# Patient Record
Sex: Male | Born: 1975 | Race: White | Hispanic: No | Smoking: Never smoker
Health system: Southern US, Community
[De-identification: ages and names within clinical notes are randomized; demographics above are authoritative.]

## PROBLEM LIST (undated history)

## (undated) ENCOUNTER — Emergency Department (HOSPITAL_COMMUNITY)
Admission: RE | Disposition: A | Discharge status: Home / Self Care | Payer: Medicaid Other | Attending: Emergency Medicine | Admitting: Emergency Medicine

## (undated) DIAGNOSIS — F4310 Post-traumatic stress disorder, unspecified: Secondary | ICD-10-CM

## (undated) DIAGNOSIS — F32A Depression, unspecified: Secondary | ICD-10-CM

## (undated) DIAGNOSIS — B20 Human immunodeficiency virus [HIV] disease: Secondary | ICD-10-CM

## (undated) DIAGNOSIS — C801 Malignant (primary) neoplasm, unspecified: Secondary | ICD-10-CM

## (undated) DIAGNOSIS — F329 Major depressive disorder, single episode, unspecified: Secondary | ICD-10-CM

## (undated) DIAGNOSIS — C8590 Non-Hodgkin lymphoma, unspecified, unspecified site: Secondary | ICD-10-CM

## (undated) DIAGNOSIS — D649 Anemia, unspecified: Secondary | ICD-10-CM

## (undated) DIAGNOSIS — K7460 Unspecified cirrhosis of liver: Secondary | ICD-10-CM

## (undated) HISTORY — PX: OTHER SURGICAL HISTORY: SHX169

## (undated) HISTORY — PX: CHOLECYSTECTOMY: SHX55

## (undated) HISTORY — PX: APPENDECTOMY: SHX54

---

## 2013-10-16 ENCOUNTER — Emergency Department (HOSPITAL_COMMUNITY): Payer: Medicaid Other

## 2013-10-16 ENCOUNTER — Inpatient Hospital Stay (HOSPITAL_COMMUNITY)
Admission: EM | Admit: 2013-10-16 | Discharge: 2013-10-22 | DRG: 977 | Disposition: A | Discharge status: Other Acute Inpatient Hospital | Payer: Medicaid Other | Attending: Internal Medicine | Admitting: Internal Medicine

## 2013-10-16 ENCOUNTER — Encounter (HOSPITAL_COMMUNITY): Payer: Self-pay | Admitting: Emergency Medicine

## 2013-10-16 DIAGNOSIS — K529 Noninfective gastroenteritis and colitis, unspecified: Secondary | ICD-10-CM | POA: Diagnosis present

## 2013-10-16 DIAGNOSIS — Z9089 Acquired absence of other organs: Secondary | ICD-10-CM

## 2013-10-16 DIAGNOSIS — D696 Thrombocytopenia, unspecified: Secondary | ICD-10-CM

## 2013-10-16 DIAGNOSIS — I10 Essential (primary) hypertension: Secondary | ICD-10-CM | POA: Diagnosis present

## 2013-10-16 DIAGNOSIS — C8589 Other specified types of non-Hodgkin lymphoma, extranodal and solid organ sites: Secondary | ICD-10-CM | POA: Diagnosis present

## 2013-10-16 DIAGNOSIS — A09 Infectious gastroenteritis and colitis, unspecified: Principal | ICD-10-CM | POA: Diagnosis present

## 2013-10-16 DIAGNOSIS — Z9221 Personal history of antineoplastic chemotherapy: Secondary | ICD-10-CM

## 2013-10-16 DIAGNOSIS — R197 Diarrhea, unspecified: Secondary | ICD-10-CM

## 2013-10-16 DIAGNOSIS — R188 Other ascites: Secondary | ICD-10-CM | POA: Diagnosis present

## 2013-10-16 DIAGNOSIS — E876 Hypokalemia: Secondary | ICD-10-CM | POA: Diagnosis present

## 2013-10-16 DIAGNOSIS — R109 Unspecified abdominal pain: Secondary | ICD-10-CM

## 2013-10-16 DIAGNOSIS — K219 Gastro-esophageal reflux disease without esophagitis: Secondary | ICD-10-CM | POA: Diagnosis present

## 2013-10-16 DIAGNOSIS — R112 Nausea with vomiting, unspecified: Secondary | ICD-10-CM | POA: Diagnosis present

## 2013-10-16 DIAGNOSIS — D61818 Other pancytopenia: Secondary | ICD-10-CM | POA: Diagnosis present

## 2013-10-16 DIAGNOSIS — D72819 Decreased white blood cell count, unspecified: Secondary | ICD-10-CM

## 2013-10-16 DIAGNOSIS — R1110 Vomiting, unspecified: Secondary | ICD-10-CM

## 2013-10-16 DIAGNOSIS — C8580 Other specified types of non-Hodgkin lymphoma, unspecified site: Secondary | ICD-10-CM | POA: Diagnosis present

## 2013-10-16 DIAGNOSIS — B20 Human immunodeficiency virus [HIV] disease: Secondary | ICD-10-CM

## 2013-10-16 DIAGNOSIS — K7460 Unspecified cirrhosis of liver: Secondary | ICD-10-CM | POA: Diagnosis present

## 2013-10-16 DIAGNOSIS — K5289 Other specified noninfective gastroenteritis and colitis: Secondary | ICD-10-CM

## 2013-10-16 DIAGNOSIS — B370 Candidal stomatitis: Secondary | ICD-10-CM | POA: Diagnosis present

## 2013-10-16 HISTORY — DX: Non-Hodgkin lymphoma, unspecified, unspecified site: C85.90

## 2013-10-16 HISTORY — DX: Malignant (primary) neoplasm, unspecified: C80.1

## 2013-10-16 LAB — URINALYSIS, ROUTINE W REFLEX MICROSCOPIC
Bilirubin Urine: NEGATIVE
GLUCOSE, UA: NEGATIVE mg/dL
Leukocytes, UA: NEGATIVE
Nitrite: NEGATIVE
PH: 7 (ref 5.0–8.0)
Protein, ur: NEGATIVE mg/dL
Specific Gravity, Urine: 1.025 (ref 1.005–1.030)
UROBILINOGEN UA: 0.2 mg/dL (ref 0.0–1.0)

## 2013-10-16 LAB — COMPREHENSIVE METABOLIC PANEL
ALBUMIN: 2.6 g/dL — AB (ref 3.5–5.2)
ALT: 23 U/L (ref 0–53)
AST: 45 U/L — AB (ref 0–37)
Alkaline Phosphatase: 92 U/L (ref 39–117)
BUN: 7 mg/dL (ref 6–23)
CHLORIDE: 109 meq/L (ref 96–112)
CO2: 21 mEq/L (ref 19–32)
Calcium: 8.5 mg/dL (ref 8.4–10.5)
Creatinine, Ser: 0.71 mg/dL (ref 0.50–1.35)
GFR calc Af Amer: >90 mL/min (ref >90)
GFR calc non Af Amer: >90 mL/min (ref >90)
Glucose, Bld: 91 mg/dL (ref 70–99)
Potassium: 3.2 mEq/L — ABNORMAL LOW (ref 3.7–5.3)
Sodium: 141 mEq/L (ref 137–147)
TOTAL PROTEIN: 6.5 g/dL (ref 6.0–8.3)
Total Bilirubin: 3.3 mg/dL — ABNORMAL HIGH (ref 0.3–1.2)

## 2013-10-16 LAB — CBC WITH DIFFERENTIAL/PLATELET
Basophils Absolute: 0 10*3/uL (ref 0.0–0.1)
Basophils Relative: 1 % (ref 0–1)
EOS PCT: 3 % (ref 0–5)
Eosinophils Absolute: 0.1 10*3/uL (ref 0.0–0.7)
HEMATOCRIT: 35.7 % — AB (ref 39.0–52.0)
Hemoglobin: 13 g/dL (ref 13.0–17.0)
LYMPHS ABS: 0.8 10*3/uL (ref 0.7–4.0)
LYMPHS PCT: 24 % (ref 12–46)
MCH: 36.2 pg — AB (ref 26.0–34.0)
MCHC: 36.4 g/dL — ABNORMAL HIGH (ref 30.0–36.0)
MCV: 99.4 fL (ref 78.0–100.0)
MONO ABS: 0.3 10*3/uL (ref 0.1–1.0)
Monocytes Relative: 9 % (ref 3–12)
Neutro Abs: 2.2 10*3/uL (ref 1.7–7.7)
Neutrophils Relative %: 64 % (ref 43–77)
Platelets: 58 10*3/uL — ABNORMAL LOW (ref 150–400)
RBC: 3.59 MIL/uL — AB (ref 4.22–5.81)
RDW: 16.5 % — ABNORMAL HIGH (ref 11.5–15.5)
WBC: 3.5 10*3/uL — AB (ref 4.0–10.5)

## 2013-10-16 LAB — I-STAT CG4 LACTIC ACID, ED: Lactic Acid, Venous: 1.48 mmol/L (ref 0.5–2.2)

## 2013-10-16 LAB — URINE MICROSCOPIC-ADD ON

## 2013-10-16 LAB — LIPASE, BLOOD: Lipase: 133 U/L — ABNORMAL HIGH (ref 11–59)

## 2013-10-16 LAB — CLOSTRIDIUM DIFFICILE BY PCR: CDIFFPCR: NEGATIVE

## 2013-10-16 LAB — SEDIMENTATION RATE: Sed Rate: 15 mm/hr (ref 0–16)

## 2013-10-16 MED ORDER — RITONAVIR 100 MG PO CAPS
100.0000 mg | ORAL_CAPSULE | Freq: Every day | ORAL | Status: DC
  Administered 2013-10-17 – 2013-10-22 (×6): 100 mg via ORAL
  Filled 2013-10-16 (×7): qty 1

## 2013-10-16 MED ORDER — ACETAMINOPHEN 325 MG PO TABS: 650.0000 mg | ORAL_TABLET | Freq: Four times a day (QID) | ORAL | Status: DC | PRN

## 2013-10-16 MED ORDER — SODIUM CHLORIDE 0.9 % IJ SOLN
3.0000 mL | Freq: Two times a day (BID) | INTRAMUSCULAR | Status: DC
  Administered 2013-10-16 – 2013-10-22 (×4): 3 mL via INTRAVENOUS

## 2013-10-16 MED ORDER — MORPHINE SULFATE 4 MG/ML IJ SOLN
4.0000 mg | Freq: Once | INTRAMUSCULAR | Status: AC
  Administered 2013-10-16: 4 mg via INTRAVENOUS
  Filled 2013-10-16: qty 1

## 2013-10-16 MED ORDER — SODIUM CHLORIDE 0.9 % IV BOLUS (SEPSIS)
1000.0000 mL | Freq: Once | INTRAVENOUS | Status: AC
  Administered 2013-10-16: 1000 mL via INTRAVENOUS

## 2013-10-16 MED ORDER — DARUNAVIR ETHANOLATE 800 MG PO TABS
800.0000 mg | ORAL_TABLET | Freq: Every day | ORAL | Status: DC
  Administered 2013-10-17 – 2013-10-22 (×6): 800 mg via ORAL
  Filled 2013-10-16 (×7): qty 1

## 2013-10-16 MED ORDER — EMTRICITABINE-TENOFOVIR DF 200-300 MG PO TABS
1.0000 | ORAL_TABLET | Freq: Every day | ORAL | Status: DC
  Administered 2013-10-16 – 2013-10-21 (×6): 1 via ORAL
  Filled 2013-10-16 (×7): qty 1

## 2013-10-16 MED ORDER — HYDROMORPHONE HCL PF 1 MG/ML IJ SOLN
1.0000 mg | Freq: Once | INTRAMUSCULAR | Status: AC
  Administered 2013-10-16: 1 mg via INTRAVENOUS
  Filled 2013-10-16: qty 1

## 2013-10-16 MED ORDER — PNEUMOCOCCAL VAC POLYVALENT 25 MCG/0.5ML IJ INJ
0.5000 mL | INJECTION | INTRAMUSCULAR | Status: AC
  Administered 2013-10-17: 0.5 mL via INTRAMUSCULAR
  Filled 2013-10-16: qty 0.5

## 2013-10-16 MED ORDER — ONDANSETRON HCL 4 MG/2ML IJ SOLN
4.0000 mg | Freq: Once | INTRAMUSCULAR | Status: AC
  Administered 2013-10-16: 4 mg via INTRAVENOUS
  Filled 2013-10-16: qty 2

## 2013-10-16 MED ORDER — ESCITALOPRAM OXALATE 10 MG PO TABS
20.0000 mg | ORAL_TABLET | Freq: Every day | ORAL | Status: DC
  Administered 2013-10-17 – 2013-10-22 (×6): 20 mg via ORAL
  Filled 2013-10-16 (×6): qty 2

## 2013-10-16 MED ORDER — MORPHINE SULFATE 2 MG/ML IJ SOLN
2.0000 mg | INTRAMUSCULAR | Status: DC | PRN
  Administered 2013-10-16: 2 mg via INTRAVENOUS
  Filled 2013-10-16: qty 1

## 2013-10-16 MED ORDER — SODIUM CHLORIDE 0.9 % IV SOLN
INTRAVENOUS | Status: DC
  Administered 2013-10-17 – 2013-10-22 (×10): via INTRAVENOUS

## 2013-10-16 MED ORDER — PRAZOSIN HCL 2 MG PO CAPS
3.0000 mg | ORAL_CAPSULE | Freq: Every day | ORAL | Status: DC
  Administered 2013-10-16: 3 mg via ORAL
  Filled 2013-10-16 (×2): qty 1

## 2013-10-16 MED ORDER — GABAPENTIN 300 MG PO CAPS
300.0000 mg | ORAL_CAPSULE | Freq: Three times a day (TID) | ORAL | Status: DC
  Administered 2013-10-16 – 2013-10-22 (×18): 300 mg via ORAL
  Filled 2013-10-16 (×18): qty 1

## 2013-10-16 MED ORDER — POLYETHYLENE GLYCOL 3350 17 G PO PACK
17.0000 g | PACK | Freq: Every day | ORAL | Status: DC
Start: 2013-10-16 — End: 2013-10-19
  Administered 2013-10-17 – 2013-10-18 (×2): 17 g via ORAL
  Filled 2013-10-16 (×2): qty 1

## 2013-10-16 MED ORDER — ONDANSETRON HCL 4 MG/2ML IJ SOLN
4.0000 mg | Freq: Four times a day (QID) | INTRAMUSCULAR | Status: DC | PRN
  Administered 2013-10-18 – 2013-10-21 (×4): 4 mg via INTRAVENOUS
  Filled 2013-10-16 (×4): qty 2

## 2013-10-16 MED ORDER — IOHEXOL 300 MG/ML  SOLN
100.0000 mL | Freq: Once | INTRAMUSCULAR | Status: AC | PRN
  Administered 2013-10-16: 100 mL via INTRAVENOUS

## 2013-10-16 MED ORDER — ATOVAQUONE 750 MG/5ML PO SUSP
1500.0000 mg | Freq: Every day | ORAL | Status: DC
  Administered 2013-10-17 – 2013-10-22 (×6): 1500 mg via ORAL
  Filled 2013-10-16 (×7): qty 10

## 2013-10-16 MED ORDER — ONDANSETRON HCL 4 MG PO TABS: 4.0000 mg | ORAL_TABLET | Freq: Four times a day (QID) | ORAL | Status: DC | PRN

## 2013-10-16 MED ORDER — CIPROFLOXACIN IN D5W 400 MG/200ML IV SOLN
400.0000 mg | Freq: Two times a day (BID) | INTRAVENOUS | Status: DC
  Administered 2013-10-16 – 2013-10-22 (×12): 400 mg via INTRAVENOUS
  Filled 2013-10-16 (×12): qty 200

## 2013-10-16 MED ORDER — ACETAMINOPHEN 650 MG RE SUPP: 650.0000 mg | Freq: Four times a day (QID) | RECTAL | Status: DC | PRN

## 2013-10-16 MED ORDER — ALUM & MAG HYDROXIDE-SIMETH 200-200-20 MG/5ML PO SUSP: 30.0000 mL | Freq: Four times a day (QID) | ORAL | Status: DC | PRN

## 2013-10-16 MED ORDER — METRONIDAZOLE IN NACL 5-0.79 MG/ML-% IV SOLN
500.0000 mg | Freq: Three times a day (TID) | INTRAVENOUS | Status: DC
  Administered 2013-10-16 – 2013-10-22 (×18): 500 mg via INTRAVENOUS
  Filled 2013-10-16 (×18): qty 100

## 2013-10-16 MED ORDER — METRONIDAZOLE IN NACL 5-0.79 MG/ML-% IV SOLN: 500.0000 mg | Freq: Three times a day (TID) | INTRAVENOUS | Status: DC

## 2013-10-16 MED ORDER — IOHEXOL 300 MG/ML  SOLN
50.0000 mL | Freq: Once | INTRAMUSCULAR | Status: AC | PRN
  Administered 2013-10-16: 50 mL via ORAL

## 2013-10-16 MED ORDER — AZITHROMYCIN 600 MG PO TABS
1200.0000 mg | ORAL_TABLET | ORAL | Status: DC
  Administered 2013-10-17: 1200 mg via ORAL
  Filled 2013-10-16: qty 2

## 2013-10-16 MED ORDER — SODIUM CHLORIDE 0.9 % IV SOLN
INTRAVENOUS | Status: DC
  Administered 2013-10-16: 1000 mL via INTRAVENOUS

## 2013-10-16 MED ORDER — PANTOPRAZOLE SODIUM 40 MG PO TBEC
40.0000 mg | DELAYED_RELEASE_TABLET | Freq: Two times a day (BID) | ORAL | Status: DC
  Administered 2013-10-16 – 2013-10-22 (×12): 40 mg via ORAL
  Filled 2013-10-16 (×12): qty 1

## 2013-10-16 MED ORDER — SUCRALFATE 1 G PO TABS
1.0000 g | ORAL_TABLET | Freq: Three times a day (TID) | ORAL | Status: DC
  Administered 2013-10-16 – 2013-10-22 (×23): 1 g via ORAL
  Filled 2013-10-16 (×23): qty 1

## 2013-10-16 MED ORDER — MORPHINE SULFATE 2 MG/ML IJ SOLN: 2.0000 mg | INTRAMUSCULAR | Status: DC | PRN

## 2013-10-16 MED ORDER — POTASSIUM CHLORIDE CRYS ER 20 MEQ PO TBCR
40.0000 meq | EXTENDED_RELEASE_TABLET | Freq: Once | ORAL | Status: AC
  Administered 2013-10-16: 40 meq via ORAL
  Filled 2013-10-16: qty 2

## 2013-10-16 MED ORDER — PRAZOSIN HCL 1 MG PO CAPS
ORAL_CAPSULE | ORAL | Status: AC
Start: 2013-10-16 — End: 2013-10-16
  Filled 2013-10-16: qty 3

## 2013-10-16 NOTE — ED Notes (Signed)
Home # [phone] or [phone]

## 2013-10-16 NOTE — Care Management Note (Signed)
Patient was noted to not have a PCP listed, but per patient PCP is Dr Miquel Dunn at [facility]   . Entered this information into computer.

## 2013-10-16 NOTE — ED Notes (Signed)
Attempted to call report. Was advised nurse receiving pt will call this nurse back for report. 

## 2013-10-16 NOTE — ED Notes (Signed)
abd pain , NVD since Saturday.
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
## 2013-10-16 NOTE — ED Provider Notes (Signed)
CSN: [id number]     Arrival date & time 10/16/13  1103 History  This chart was scribed for Sharyon Cable, MD by Jenne Campus, ED Scribe. This patient was seen in room APA08/APA08 and the patient's care was started at 11:33 AM.   Chief Complaint  Patient presents with  . Abdominal Pain     Patient is a 38 y.o. male presenting with diarrhea. The history is provided by the patient. No language interpreter was used.  Diarrhea Quality:  Watery Severity:  Moderate Number of episodes:  Every 30 minutes  Duration:  2 days Timing:  Constant Associated symptoms: abdominal pain and vomiting   Associated symptoms: no fever     HPI Comments: Samuel Schultz is a 38 y.o. male with a h/o HIV who presents to the Emergency Department from a rest home complaining of diarrhea with associated nausea, emesis and mid abdominal pain for the past 2 days.  Pt states that he has been experiencing diarrhea episodes described as water every 30 minutes and has had 6 episodes of emesis since onset. He denies having any complaints prior to the symptoms onset. Representative from the rest home denies having other sick contacts there. Pt reports prior episodes of similar symptoms with prior C. Diff infections. He reports a recent hopsitalization at [facility] 2 weeks ago for a cough but denies having any nausea, emesis or diarrhea at that time. CD4 counts have been lower recently but reports medication compliance. He denies any known fevers, hematochezia and hematemesis as associated symptoms.    Past Medical History  Diagnosis Date  . Cancer   . Lymphoma    Past Surgical History  Procedure Laterality Date  . Cholecystectomy    . Appendectomy     History reviewed. No pertinent family history. History  Substance Use Topics  . Smoking status: Never Smoker   . Smokeless tobacco: Not on file  . Alcohol Use: No    Review of Systems  Constitutional: Negative for fever.  Gastrointestinal: Positive for vomiting,  abdominal pain and diarrhea. Negative for blood in stool.  Genitourinary: Negative for testicular pain.  All other systems reviewed and are negative.   Allergies  Codeine  Home Medications   Prior to Admission medications   Not on File   Triage vitals: BP 134/74  Pulse 72  Temp(Src) 98.7 F (37.1 C) (Oral)  Resp 20  Ht 5\' 9"  (1.753 m)  Wt 210 lb (95.255 kg)  BMI 31.00 kg/m2  SpO2 100%  Physical Exam  Nursing note and vitals reviewed.  CONSTITUTIONAL: Well developed/well nourished HEAD: Normocephalic/atraumatic EYES: EOMI/PERRL ENMT: Mucous membranes moist NECK: supple no meningeal signs SPINE:entire spine nontender CV: S1/S2 noted, no murmurs/rubs/gallops noted LUNGS: crackles noted in base no apparent distress ABDOMEN: soft, diffuse moderate abdominal tenderness, no rebound or guarding GU:no cva tenderness NEURO: Pt is awake/alert, moves all extremitiesx4 EXTREMITIES: pulses normal, full ROM SKIN: warm, color normal PSYCH: no abnormalities of mood noted  ED Course  Procedures   Medications  morphine 4 MG/ML injection 4 mg (not administered)  ondansetron (ZOFRAN) injection 4 mg (not administered)  sodium chloride 0.9 % bolus 1,000 mL (not administered)  morphine 4 MG/ML injection 4 mg (4 mg Intravenous Given 10/16/13 1208)  ondansetron (ZOFRAN) injection 4 mg (4 mg Intravenous Given 10/16/13 1208)  sodium chloride 0.9 % bolus 1,000 mL (0 mLs Intravenous Stopped 10/16/13 1332)  sodium chloride 0.9 % bolus 1,000 mL (0 mLs Intravenous Stopped 10/16/13 1529)  ondansetron (ZOFRAN) injection 4  mg (4 mg Intravenous Given 10/16/13 1332)  morphine 4 MG/ML injection 4 mg (4 mg Intravenous Given 10/16/13 1332)  iohexol (OMNIPAQUE) 300 MG/ML solution 50 mL (50 mLs Oral Contrast Given 10/16/13 1342)    DIAGNOSTIC STUDIES: Oxygen Saturation is 100% on RA, normal by my interpretation.    COORDINATION OF CARE: 11:37 AM-Discussed treatment plan which includes medications, CXR,  CBC panel, CMP and UA with pt at bedside and pt agreed to plan.   1:15 PM Pt with conitnued abd pain will order CT imaging of abd/pelvis 3:31 PM Pt will require CT imaging.  Pt with h/o HIV and is high risk for intra-abdominal infection At signout to dr cook, f/u on CT imaging. Stool cultures are pending at this time BP 128/76  Pulse 68  Temp(Src) 97.8 F (36.6 C) (Oral)  Resp 18  Ht 5\' 9"  (1.753 m)  Wt 210 lb (95.255 kg)  BMI 31.00 kg/m2  SpO2 99% Pt reports recent admission to [facility] but unable to find records on [facility] - Abnormal; Notable for the following:    Potassium 3.2 (*)    Albumin 2.6 (*)    AST 45 (*)    Total Bilirubin 3.3 (*)    All other components within normal limits  CBC WITH DIFFERENTIAL - Abnormal; Notable for the following:    WBC 3.5 (*)    RBC 3.59 (*)    HCT 35.7 (*)    MCH 36.2 (*)    MCHC 36.4 (*)    RDW 16.5 (*)    Platelets 58 (*)    All other components within normal limits  LIPASE, BLOOD - Abnormal; Notable for the following:    Lipase 133 (*)    All other components within normal limits  CLOSTRIDIUM DIFFICILE BY PCR  STOOL CULTURE  URINALYSIS, ROUTINE W REFLEX MICROSCOPIC  I-STAT CG4 LACTIC ACID, ED    Imaging Review Dg Chest Portable 1 View  10/16/2013   CLINICAL DATA:  Cough; lymphoma  EXAM: PORTABLE CHEST - 1 VIEW  COMPARISON:  None.  FINDINGS: Lungs are clear. Heart is upper normal in size with normal pulmonary vascularity. Central catheter tip is in the right atrium. No pneumothorax. No adenopathy. No bone lesions.  IMPRESSION: No edema or consolidation.  No pneumothorax.   Electronically Signed   By: Lowella Grip M.D.   On: 10/16/2013 12:08      MDM   Final diagnoses:  Abdominal pain  Vomiting and diarrhea  Leukopenia  Thrombocytopenia    Nursing notes including past medical history and social history reviewed and considered in documentation xrays  reviewed and considered Labs/vital reviewed and considered   I personally performed the services described in this documentation, which was scribed in my presence. The recorded information has been reviewed and is accurate.     Sharyon Cable, MD 10/16/13 [phone]

## 2013-10-16 NOTE — ED Notes (Signed)
Attempted to call report

## 2013-10-17 DIAGNOSIS — K7460 Unspecified cirrhosis of liver: Secondary | ICD-10-CM

## 2013-10-17 LAB — CBC
HCT: 31.1 % — ABNORMAL LOW (ref 39.0–52.0)
Hemoglobin: 11 g/dL — ABNORMAL LOW (ref 13.0–17.0)
MCH: 35.9 pg — AB (ref 26.0–34.0)
MCHC: 35.4 g/dL (ref 30.0–36.0)
MCV: 101.6 fL — ABNORMAL HIGH (ref 78.0–100.0)
PLATELETS: 42 10*3/uL — AB (ref 150–400)
RBC: 3.06 MIL/uL — ABNORMAL LOW (ref 4.22–5.81)
RDW: 16.9 % — AB (ref 11.5–15.5)
WBC: 1.3 10*3/uL — AB (ref 4.0–10.5)

## 2013-10-17 LAB — BASIC METABOLIC PANEL
BUN: 7 mg/dL (ref 6–23)
CALCIUM: 7.9 mg/dL — AB (ref 8.4–10.5)
CO2: 22 mEq/L (ref 19–32)
CREATININE: 0.82 mg/dL (ref 0.50–1.35)
Chloride: 111 mEq/L (ref 96–112)
GFR calc Af Amer: >90 mL/min (ref >90)
Glucose, Bld: 84 mg/dL (ref 70–99)
Potassium: 4.1 mEq/L (ref 3.7–5.3)
SODIUM: 139 meq/L (ref 137–147)

## 2013-10-17 LAB — CRYPTOSPORIDIUM SMEAR, FECAL: CRYPTOSPORIDIUM SMEAR.: NONE SEEN

## 2013-10-17 LAB — OVA AND PARASITE EXAMINATION: OVA AND PARASITES: NONE SEEN

## 2013-10-17 MED ORDER — HYDROMORPHONE HCL PF 1 MG/ML IJ SOLN
1.0000 mg | Freq: Once | INTRAMUSCULAR | Status: AC
  Administered 2013-10-17: 1 mg via INTRAVENOUS
  Filled 2013-10-17: qty 1

## 2013-10-17 MED ORDER — DICYCLOMINE HCL 10 MG/ML IM SOLN
20.0000 mg | Freq: Once | INTRAMUSCULAR | Status: AC
  Administered 2013-10-17: 20 mg via INTRAMUSCULAR
  Filled 2013-10-17 (×2): qty 2

## 2013-10-17 MED ORDER — HYDROMORPHONE HCL PF 1 MG/ML IJ SOLN
1.0000 mg | INTRAMUSCULAR | Status: DC | PRN
  Administered 2013-10-17 – 2013-10-18 (×8): 1 mg via INTRAVENOUS
  Filled 2013-10-17 (×8): qty 1

## 2013-10-17 MED ORDER — PRAZOSIN HCL 1 MG PO CAPS
3.0000 mg | ORAL_CAPSULE | Freq: Every day | ORAL | Status: DC
  Administered 2013-10-17: 3 mg via ORAL
  Filled 2013-10-17 (×3): qty 3

## 2013-10-17 MED ORDER — BOOST / RESOURCE BREEZE PO LIQD
1.0000 | Freq: Three times a day (TID) | ORAL | Status: DC
  Administered 2013-10-17 – 2013-10-22 (×16): 1 via ORAL

## 2013-10-17 NOTE — Progress Notes (Signed)
INITIAL NUTRITION ASSESSMENT  DOCUMENTATION CODES Per approved criteria  -Obesity Unspecified   INTERVENTION: Resource Breeze po TID, each supplement provides 250 kcal and 9 grams of protein  NUTRITION DIAGNOSIS: Inadequate oral intake related to decreased appetite as evidenced by 3.6% wt loss x 1 week, poor po's.   Goal: Pt will meet >90% of estimated nutritional needs  Monitor:  Diet advancement, PO intake, labs, skin assessments, weight changes, I/O's  Reason for Assessment: MST=2  38 y.o. male  Admitting Dx: Colitis, acute  ASSESSMENT: Pt admitted from a local group home with abdominal pain secondary to colitis. Pt recently relocated to [place] from the [facility] area due to group home placement.  Pt reports variable appetite PTA, reporting his appetite "comes and goes". He reveals he generally has nausea in the AM, which affects his appetite. He reveals that he has lost 8# (3.6%) wt loss x 1 week, which is clinically significant. Reports UBW around 220#. Pt reports tolerating liquid diet well, however, noted he went to the bathroom shortly after drinking it. He reports he has gone to the bathroom 5 times today. He does not feel like he would be able to tolerate anything more substantial than clear liquids at this time. He reports he ate only chicken broth for lunch.  He does not drink nutritional supplements PTA, but asked about a clear liquid supplement. He is agreeable to try [employer].  Nutrition Focused Physical Exam:  Subcutaneous Fat:  Orbital Region: WDL Upper Arm Region: WDL Thoracic and Lumbar Region: WDL  Muscle:  Temple Region: WDL Clavicle Bone Region: WDL Clavicle and Acromion Bone Region: WDL Scapular Bone Region: WDL Dorsal Hand: WDL Patellar Region: WDL Anterior Thigh Region: WDL Posterior Calf Region: WDL  Edema: none present   Height: Ht Readings from Last 1 Encounters:  10/16/13 5\' 9"  (1.753 m)    Weight: Wt Readings from Last 1  Encounters:  10/16/13 212 lb 1.6 oz (96.208 kg)    Ideal Body Weight: 160#  % Ideal Body Weight: 133%  Wt Readings from Last 10 Encounters:  10/16/13 212 lb 1.6 oz (96.208 kg)    Usual Body Weight: 220#  % Usual Body Weight: 96%  BMI:  Body mass index is 31.31 kg/(m^2). Meets criteria for obesity, class I.   Estimated Nutritional Needs: Kcal: 1800-2000 daily Protein: 96-134 grams daily Fluid: 1.8-2.0 L daily  Skin: WDL  Diet Order: Clear Liquid  EDUCATION NEEDS: -Education needs addressed   Intake/Output Summary (Last 24 hours) at 10/17/13 1500 Last data filed at 10/17/13 7829  Gross per 24 hour  Intake 1643.75 ml  Output    300 ml  Net 1343.75 ml    Last BM: 10/16/13  Labs:   Recent Labs Lab 10/16/13 1149 10/17/13 0514  NA 141 139  K 3.2* 4.1  CL 109 111  CO2 21 22  BUN 7 7  CREATININE 0.71 0.82  CALCIUM 8.5 7.9*  GLUCOSE 91 84    CBG (last 3)  No results found for this basename: GLUCAP,  in the last 72 hours  Scheduled Meds: . atovaquone  1,500 mg Oral Daily  . azithromycin  1,200 mg Oral Q7 days  . ciprofloxacin  400 mg Intravenous Q12H  . Darunavir Ethanolate  800 mg Oral Q breakfast  . emtricitabine-tenofovir  1 tablet Oral QHS  . escitalopram  20 mg Oral Daily  . gabapentin  300 mg Oral TID  . metronidazole  500 mg Intravenous Q8H  . pantoprazole  40  mg Oral BID  . polyethylene glycol  17 g Oral Daily  . prazosin  3 mg Oral QHS  . ritonavir  100 mg Oral Q breakfast  . sodium chloride  3 mL Intravenous Q12H  . sucralfate  1 g Oral TID WC & HS    Continuous Infusions: . sodium chloride 125 mL/hr at 10/17/13 1104    Past Medical History  Diagnosis Date  . Cancer   . Lymphoma     Past Surgical History  Procedure Laterality Date  . Cholecystectomy    . Appendectomy      Willy Pinkerton A. Jimmye Norman, RD, LDN Pager: [phone]

## 2013-10-17 NOTE — Progress Notes (Signed)
[facility] PROGRESS NOTE  Samuel Schultz GUR:[id number] DOB: [birth year] DOA: 10/16/2013 PCP: No primary provider on file.  Assessment/Plan: 1. Acute colitis, likely infectious. Patient does have a history of AIDS. Stool for C. difficile, cryptosporidium, ova and parasites were found to be negative. We'll send for GI pathogen panel. Continue current antibiotics. Advance diet. If patient fails to improve, consider GI consultation. 2. AIDS. Continue antiretroviral therapy. He is on May problem and azithromycin for prophylaxis. Last CD4 count was less than 100 3. History lymphoma. Was previously treated with chemotherapy. Reports that he is in remission. 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 4. Cirrhosis. Related to lymphoma. 5. Pancytopenia, likely related to cirrhosis. No evidence of bleeding. Continue to follow  Code Status: full code Family Communication: no family present Disposition Plan: discharge home once improved   Consultants:    Procedures:    Antibiotics:  Ciprofloxacin 5/11>>  Flagyl 5/11>>  HPI/Subjective: Continues to have watery stool, no vomiting, wants to advance diet  Objective: Filed Vitals:   10/17/13 2000  BP: 96/43  Pulse: 74  Temp: 97.7 F (36.5 C)  Resp: 20    Intake/Output Summary (Last 24 hours) at 10/17/13 2030 Last data filed at 10/17/13 2001  Gross per 24 hour  Intake 643.75 ml  Output    950 ml  Net -306.25 ml   Filed Weights   10/16/13 1111 10/16/13 2051  Weight: 95.255 kg (210 lb) 96.208 kg (212 lb 1.6 oz)    Exam:   General:  No acute distress  Cardiovascular: S1, S2, regular rate and rhythm  Respiratory: Clear to auscultation bilaterally  Abdomen: Soft, tender in the lower abdomen, positive bowel sounds  Musculoskeletal: No pedal edema bilaterally   Data Reviewed: Basic Metabolic Panel:  Recent Labs Lab 10/16/13 1149 10/17/13 0514  NA 141 139  K 3.2* 4.1  CL 109 111  CO2 21 22   GLUCOSE 91 84  BUN 7 7  CREATININE 0.71 0.82  CALCIUM 8.5 7.9*   Liver Function Tests:  Recent Labs Lab 10/16/13 1149  AST 45*  ALT 23  ALKPHOS 92  BILITOT 3.3*  PROT 6.5  ALBUMIN 2.6*    Recent Labs Lab 10/16/13 1149  LIPASE 133*   No results found for this basename: AMMONIA,  in the last 168 hours CBC:  Recent Labs Lab 10/16/13 1149 10/17/13 0514  WBC 3.5* 1.3*  NEUTROABS 2.2  --   HGB 13.0 11.0*  HCT 35.7* 31.1*  MCV 99.4 101.6*  PLT 58* 42*   Cardiac Enzymes: No results found for this basename: CKTOTAL, CKMB, CKMBINDEX, TROPONINI,  in the last 168 hours BNP (last 3 results) No results found for this basename: PROBNP,  in the last 8760 hours CBG: No results found for this basename: GLUCAP,  in the last 168 hours  Recent Results (from the past 240 hour(s))  CLOSTRIDIUM DIFFICILE BY PCR     Status: None   Collection Time    10/16/13  3:48 PM      Result Value Ref Range Status   C difficile by pcr NEGATIVE  NEGATIVE Final  OVA AND PARASITE EXAMINATION     Status: None   Collection Time    10/16/13  3:48 PM      Result Value Ref Range Status   Specimen Description STOOL   Final   Special Requests IMMUNE:COMPROMISED   Final   Ova and parasites     Final   Value: NO OVA OR PARASITES SEEN  Performed at [facility]   Report Status 10/17/2013 FINAL   Final  CRYPTOSPORIDIUM SMEAR, FECAL     Status: None   Collection Time    10/16/13  3:48 PM      Result Value Ref Range Status   Specimen Description STOOL   Final   Special Requests NONE   Final   Cryptosporidium Smear.     Final   Value: NO Cryptosporidium Cyclospora or Isospora seen.     Performed at [facility]   Report Status 10/17/2013 FINAL   Final  CULTURE, BLOOD (ROUTINE X 2)     Status: None   Collection Time    10/16/13  6:06 PM      Result Value Ref Range Status   Specimen Description BLOOD RIGHT ANTECUBITAL   Final   Special Requests     Final   Value: BOTTLES DRAWN  AEROBIC AND ANAEROBIC 8CC IMMUNE:COMPROMISED   Culture NO GROWTH 1 DAY   Final   Report Status PENDING   Incomplete  CULTURE, BLOOD (ROUTINE X 2)     Status: None   Collection Time    10/16/13  6:11 PM      Result Value Ref Range Status   Specimen Description BLOOD LEFT ANTECUBITAL   Final   Special Requests     Final   Value: BOTTLES DRAWN AEROBIC AND ANAEROBIC 8CC IMMUNE:COMPROMISED   Culture NO GROWTH 1 DAY   Final   Report Status PENDING   Incomplete     Studies: Ct Abdomen Pelvis W Contrast  10/16/2013   CLINICAL DATA:  Abdomen pain in nausea vomiting since Saturday. History of lymphoma.  EXAM: CT ABDOMEN AND PELVIS WITH CONTRAST  TECHNIQUE: Multidetector CT imaging of the abdomen and pelvis was performed using the standard protocol following bolus administration of intravenous contrast.  CONTRAST:  119mL OMNIPAQUE IOHEXOL 300 MG/ML  SOLN  COMPARISON:  None.  FINDINGS: There is diffuse fatty infiltration the liver. There is mild nodular contour with enlargement of left lobe liver. There are enlarged varices at the splenic hilum. The spleen is enlarged measuring 17.3 cm in length. There is ascites in the abdomen and pelvis. The patient is status post prior cholecystectomy. There are surgical clips surrounding the lateral periphery of the liver. The pancreas is normal. The adrenal glands and kidneys are normal. There is no hydronephrosis bilaterally. There is no abdominal lymphadenopathy. There is umbilical herniation of mesenteric fat.  The small bowel loops are mildly prominent without evidence of frank obstruction. There is diffuse bowel wall thickening of throughout the colon. The patient is status post prior appendectomy.  Free fluid is identified within the pelvis. The bladder is decompressed limiting evaluation. There is no pelvic lymphadenopathy.  Images of the lung bases demonstrate mild atelectasis of the posterior lung bases. There is no pulmonary mass or pleural effusion. Images of the  bones demonstrate patchy increased sclerosis at T7, T10, T11, L3, L5, S1 vertebral bodies as well as mixed sclerotic and lytic lucencies in bilateral ilium.  IMPRESSION: Diffuse bowel wall thickening throughout the colon. The findings can be seen in colitis either infectious or inflammatory. Vascular ischemia is much less likely.  Changes of cirrhosis of liver for as described.  Splenomegaly.  Findings suspicious for bone metastasis.   Electronically Signed   By: Abelardo Diesel M.D.   On: 10/16/2013 16:07   Dg Chest Portable 1 View  10/16/2013   CLINICAL DATA:  Cough; lymphoma  EXAM: PORTABLE CHEST -  1 VIEW  COMPARISON:  None.  FINDINGS: Lungs are clear. Heart is upper normal in size with normal pulmonary vascularity. Central catheter tip is in the right atrium. No pneumothorax. No adenopathy. No bone lesions.  IMPRESSION: No edema or consolidation.  No pneumothorax.   Electronically Signed   By: Lowella Grip M.D.   On: 10/16/2013 12:08    Scheduled Meds: . atovaquone  1,500 mg Oral Daily  . azithromycin  1,200 mg Oral Q7 days  . ciprofloxacin  400 mg Intravenous Q12H  . Darunavir Ethanolate  800 mg Oral Q breakfast  . emtricitabine-tenofovir  1 tablet Oral QHS  . escitalopram  20 mg Oral Daily  . feeding supplement (RESOURCE BREEZE)  1 Container Oral TID BM  . gabapentin  300 mg Oral TID  . metronidazole  500 mg Intravenous Q8H  . pantoprazole  40 mg Oral BID  . polyethylene glycol  17 g Oral Daily  . prazosin  3 mg Oral QHS  . ritonavir  100 mg Oral Q breakfast  . sodium chloride  3 mL Intravenous Q12H  . sucralfate  1 g Oral TID WC & HS   Continuous Infusions: . sodium chloride 125 mL/hr at 10/17/13 1104    Principal Problem:   Colitis, acute Active Problems:   Abdominal pain   AIDS   Large cell lymphoma   Cirrhosis, non-alcoholic   Pancytopenia   Diarrhea   Nausea and vomiting in adult   Hypokalemia   Acute colitis    Time spent: 67mins    Dang Mathison  [facility] Pager [phone]. If 7PM-7AM, please contact night-coverage at [email or web address], password [facility] 10/17/2013, 8:30 PM  LOS: 1 day

## 2013-10-17 NOTE — Progress Notes (Signed)
Pt's stool sample came back negative for c.diff. Will take off enteric precautions, will continue to monitor.

## 2013-10-17 NOTE — Progress Notes (Signed)
CRITICAL VALUE ALERT  Critical value received:  WBC 1.3   Date of notification:  10/17/13  Time of notification:  0630  Critical value read back:yes  Nurse who received alert:  Penni Homans, RN   MD notified (1st page):  Dr. Darrick Meigs  Time of first page:  0630  MD notified (2nd page):  Time of second page:  Responding MD:  Dr. Darrick Meigs  Time MD responded:

## 2013-10-17 NOTE — Care Management Utilization Note (Signed)
UR completed 

## 2013-10-18 ENCOUNTER — Other Ambulatory Visit (HOSPITAL_COMMUNITY): Payer: Self-pay | Admitting: Hematology and Oncology

## 2013-10-18 DIAGNOSIS — Z87898 Personal history of other specified conditions: Secondary | ICD-10-CM

## 2013-10-18 DIAGNOSIS — K598 Other specified functional intestinal disorders: Secondary | ICD-10-CM

## 2013-10-18 DIAGNOSIS — K5989 Other specified functional intestinal disorders: Secondary | ICD-10-CM

## 2013-10-18 DIAGNOSIS — B150 Hepatitis A with hepatic coma: Secondary | ICD-10-CM

## 2013-10-18 DIAGNOSIS — D709 Neutropenia, unspecified: Secondary | ICD-10-CM

## 2013-10-18 LAB — CBC WITH DIFFERENTIAL/PLATELET
BASOS ABS: 0 10*3/uL (ref 0.0–0.1)
BASOS PCT: 1 % (ref 0–1)
Eosinophils Absolute: 0.1 10*3/uL (ref 0.0–0.7)
Eosinophils Relative: 7 % — ABNORMAL HIGH (ref 0–5)
HCT: 28.8 % — ABNORMAL LOW (ref 39.0–52.0)
Hemoglobin: 10.3 g/dL — ABNORMAL LOW (ref 13.0–17.0)
LYMPHS PCT: 46 % (ref 12–46)
Lymphs Abs: 0.4 10*3/uL — ABNORMAL LOW (ref 0.7–4.0)
MCH: 36.4 pg — ABNORMAL HIGH (ref 26.0–34.0)
MCHC: 35.8 g/dL (ref 30.0–36.0)
MCV: 101.8 fL — ABNORMAL HIGH (ref 78.0–100.0)
MONO ABS: 0.1 10*3/uL (ref 0.1–1.0)
Monocytes Relative: 12 % (ref 3–12)
NEUTROS ABS: 0.3 10*3/uL — AB (ref 1.7–7.7)
NEUTROS PCT: 33 % — AB (ref 43–77)
Platelets: 33 10*3/uL — ABNORMAL LOW (ref 150–400)
RBC: 2.83 MIL/uL — ABNORMAL LOW (ref 4.22–5.81)
RDW: 16.4 % — AB (ref 11.5–15.5)
WBC: 0.8 10*3/uL — AB (ref 4.0–10.5)

## 2013-10-18 LAB — LACTATE DEHYDROGENASE: LDH: 218 U/L (ref 94–250)

## 2013-10-18 LAB — BASIC METABOLIC PANEL
BUN: 5 mg/dL — ABNORMAL LOW (ref 6–23)
CO2: 20 meq/L (ref 19–32)
CREATININE: 0.71 mg/dL (ref 0.50–1.35)
Calcium: 7.5 mg/dL — ABNORMAL LOW (ref 8.4–10.5)
Chloride: 112 mEq/L (ref 96–112)
GFR calc non Af Amer: >90 mL/min (ref >90)
Glucose, Bld: 148 mg/dL — ABNORMAL HIGH (ref 70–99)
POTASSIUM: 3.3 meq/L — AB (ref 3.7–5.3)
SODIUM: 139 meq/L (ref 137–147)

## 2013-10-18 LAB — PATHOLOGIST SMEAR REVIEW

## 2013-10-18 LAB — RETICULOCYTES
RBC.: 2.06 MIL/uL — ABNORMAL LOW (ref 4.22–5.81)
Retic Count, Absolute: 45.3 10*3/uL (ref 19.0–186.0)
Retic Ct Pct: 2.2 % (ref 0.4–3.1)

## 2013-10-18 MED ORDER — FLUCONAZOLE IN SODIUM CHLORIDE 400-0.9 MG/200ML-% IV SOLN
400.0000 mg | INTRAVENOUS | Status: DC
  Administered 2013-10-18 – 2013-10-22 (×5): 400 mg via INTRAVENOUS
  Filled 2013-10-18 (×5): qty 200

## 2013-10-18 MED ORDER — HYDROMORPHONE HCL PF 1 MG/ML IJ SOLN
1.0000 mg | INTRAMUSCULAR | Status: DC | PRN
  Administered 2013-10-19: 1 mg via INTRAVENOUS
  Filled 2013-10-18: qty 1

## 2013-10-18 MED ORDER — PRAZOSIN HCL 1 MG PO CAPS
1.0000 mg | ORAL_CAPSULE | Freq: Every day | ORAL | Status: DC
Start: 2013-10-18 — End: 2013-10-22
  Administered 2013-10-18 – 2013-10-21 (×4): 1 mg via ORAL
  Filled 2013-10-18 (×5): qty 1

## 2013-10-18 MED ORDER — HYDROMORPHONE HCL PF 1 MG/ML IJ SOLN
2.0000 mg | Freq: Once | INTRAMUSCULAR | Status: AC
  Administered 2013-10-18: 2 mg via INTRAVENOUS
  Filled 2013-10-18: qty 2

## 2013-10-18 MED ORDER — TBO-FILGRASTIM 480 MCG/0.8ML ~~LOC~~ SOSY
480.0000 ug | PREFILLED_SYRINGE | Freq: Once | SUBCUTANEOUS | Status: AC
  Administered 2013-10-18: 480 ug via SUBCUTANEOUS
  Filled 2013-10-18: qty 0.8

## 2013-10-18 MED ORDER — POTASSIUM CHLORIDE CRYS ER 20 MEQ PO TBCR
40.0000 meq | EXTENDED_RELEASE_TABLET | Freq: Two times a day (BID) | ORAL | Status: AC
  Administered 2013-10-18 (×2): 40 meq via ORAL
  Filled 2013-10-18 (×2): qty 2

## 2013-10-18 MED ORDER — PRAZOSIN HCL 1 MG PO CAPS
ORAL_CAPSULE | ORAL | Status: AC
  Filled 2013-10-18: qty 1

## 2013-10-18 NOTE — Clinical Social Work Psychosocial (Signed)
     Clinical Social Work Department BRIEF PSYCHOSOCIAL ASSESSMENT 10/18/2013  Patient:  Samuel Schultz, Samuel Schultz     Account Number:  [id number]     Admit date:  10/16/2013  Clinical Social Worker:  Iona Coach  Date/Time:  10/18/2013 03:05 PM  Referred by:  Physician  Date Referred:  10/17/2013 Referred for  Other - See comment   Other Referral:   From ALF   Interview type:  Patient Other interview type:    PSYCHOSOCIAL DATA Living Status:  FACILITY Admitted from facility:  [facility] Level of care:  Assisted Living Primary support name:  Clayburn Pert  [phone] Primary support relationship to patient:  FRIEND Degree of support available:   Good but she does not live near patient    CURRENT CONCERNS Current Concerns  Other - See comment   Other Concerns:   Return to [place] / PLAN 38 year old male- resident of [facility] where he states he has resided for the past 2 weeks. He wants to return there when medically stable.  CSW spoke with Levie Heritage- owner of the facilty and she stated that they will accept patient back when medically stable. Fl2 placed on chart for MD's signature. 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  He listed a friend for support. Patient is 68 and has multiple other medical conditions.   Assessment/plan status:  Psychosocial Support/Ongoing Assessment of Needs Other assessment/ plan:   Information/referral to community resources:   None at this time    PATIENTS/FAMILYS RESPONSE TO PLAN OF CARE: Patient was lying in bed during today's visit- he is alert and awake but presented as being lethargic and sad.  CSW discussed with patient - he denies any issues with depression and states that he doesn't feel good. He denied any need at this time for support or counseling and "just wants to rest."  CSW services will monitor and  assist patient as needed. He wants to return to ALF when stable. Lorie Phenix. [place], [place]

## 2013-10-18 NOTE — Progress Notes (Addendum)
[facility] PROGRESS NOTE  Samuel Schultz RCV:[id number] DOB: March 08, 1976 DOA: 10/16/2013 PCP: No primary provider on file.  Assessment/Plan: 1. Acute colitis, likely infectious. Patient does have a history of AIDS. Stool for C. Difficile negative, cryptosporidium negative, ova and parasites were found to be negative. r GI pathogen panel pending. Continue current antibiotics. If patient fails to improve, consider GI consultation. He relates less bm today. Still with abdominal pain.  2. AIDS. Continue antiretroviral therapy. He is on Mepron and azithromycin for prophylaxis. Last CD4 count was less than 100 3. History lymphoma. Was previously treated with chemotherapy. Reports that he is in remission. 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 4. Cirrhosis. Related to lymphoma ? . 5. Pancytopenia, leukopenia, neutropenia. Worse today. Per his PCP his WBC count is not usually this low.  Will consult hematologist. Should we given him growth factor. ? No evidence of bleeding. Continue to follow. ID number; [id number]  Code Status: full code Family Communication: no family present Disposition Plan: discharge home once improved   Consultants:    Procedures:    Antibiotics:  Ciprofloxacin 5/11>>  Flagyl 5/11>>  HPI/Subjective: Still with abdominal pain. Less BM  Objective: Filed Vitals:   10/18/13 1347  BP: 108/54  Pulse: 92  Temp: 98.1 F (36.7 C)  Resp: 20    Intake/Output Summary (Last 24 hours) at 10/18/13 1646 Last data filed at 10/18/13 1432  Gross per 24 hour  Intake   5290 ml  Output   1926 ml  Net   3364 ml   Filed Weights   10/16/13 1111 10/16/13 2051  Weight: 95.255 kg (210 lb) 96.208 kg (212 lb 1.6 oz)    Exam:   General:  No acute distress  Cardiovascular: S1, S2, regular rate and rhythm  Respiratory: Clear to auscultation bilaterally  Abdomen: Soft, tender in the lower abdomen, positive bowel sounds  Musculoskeletal: No pedal  edema bilaterally   Data Reviewed: Basic Metabolic Panel:  Recent Labs Lab 10/16/13 1149 10/17/13 0514 10/18/13 0508  NA 141 139 139  K 3.2* 4.1 3.3*  CL 109 111 112  CO2 21 22 20   GLUCOSE 91 84 148*  BUN 7 7 5*  CREATININE 0.71 0.82 0.71  CALCIUM 8.5 7.9* 7.5*   Liver Function Tests:  Recent Labs Lab 10/16/13 1149  AST 45*  ALT 23  ALKPHOS 92  BILITOT 3.3*  PROT 6.5  ALBUMIN 2.6*    Recent Labs Lab 10/16/13 1149  LIPASE 133*   No results found for this basename: AMMONIA,  in the last 168 hours CBC:  Recent Labs Lab 10/16/13 1149 10/17/13 0514 10/18/13 0508  WBC 3.5* 1.3* 0.8*  NEUTROABS 2.2  --  0.3*  HGB 13.0 11.0* 10.3*  HCT 35.7* 31.1* 28.8*  MCV 99.4 101.6* 101.8*  PLT 58* 42* 33*   Cardiac Enzymes: No results found for this basename: CKTOTAL, CKMB, CKMBINDEX, TROPONINI,  in the last 168 hours BNP (last 3 results) No results found for this basename: PROBNP,  in the last 8760 hours CBG: No results found for this basename: GLUCAP,  in the last 168 hours  Recent Results (from the past 240 hour(s))  CLOSTRIDIUM DIFFICILE BY PCR     Status: None   Collection Time    10/16/13  3:48 PM      Result Value Ref Range Status   C difficile by pcr NEGATIVE  NEGATIVE Final  STOOL CULTURE     Status: None   Collection Time  10/16/13  3:48 PM      Result Value Ref Range Status   Specimen Description STOOL   Final   Special Requests IMMUNE:COMPROMISED   Final   Culture     Final   Value: NO SUSPICIOUS COLONIES, CONTINUING TO HOLD     Performed at [facility]   Report Status PENDING   Incomplete  OVA AND PARASITE EXAMINATION     Status: None   Collection Time    10/16/13  3:48 PM      Result Value Ref Range Status   Specimen Description STOOL   Final   Special Requests IMMUNE:COMPROMISED   Final   Ova and parasites     Final   Value: NO OVA OR PARASITES SEEN     Performed at [facility]   Report Status 10/17/2013 FINAL   Final   CRYPTOSPORIDIUM SMEAR, FECAL     Status: None   Collection Time    10/16/13  3:48 PM      Result Value Ref Range Status   Specimen Description STOOL   Final   Special Requests NONE   Final   Cryptosporidium Smear.     Final   Value: NO Cryptosporidium Cyclospora or Isospora seen.     Performed at [facility]   Report Status 10/17/2013 FINAL   Final  CULTURE, BLOOD (ROUTINE X 2)     Status: None   Collection Time    10/16/13  6:06 PM      Result Value Ref Range Status   Specimen Description BLOOD RIGHT ANTECUBITAL   Final   Special Requests     Final   Value: BOTTLES DRAWN AEROBIC AND ANAEROBIC 8CC IMMUNE:COMPROMISED   Culture NO GROWTH 2 DAYS   Final   Report Status PENDING   Incomplete  CULTURE, BLOOD (ROUTINE X 2)     Status: None   Collection Time    10/16/13  6:11 PM      Result Value Ref Range Status   Specimen Description BLOOD LEFT ANTECUBITAL   Final   Special Requests     Final   Value: BOTTLES DRAWN AEROBIC AND ANAEROBIC 8CC IMMUNE:COMPROMISED   Culture NO GROWTH 2 DAYS   Final   Report Status PENDING   Incomplete     Studies: No results found.  Scheduled Meds: . atovaquone  1,500 mg Oral Daily  . azithromycin  1,200 mg Oral Q7 days  . ciprofloxacin  400 mg Intravenous Q12H  . Darunavir Ethanolate  800 mg Oral Q breakfast  . emtricitabine-tenofovir  1 tablet Oral QHS  . escitalopram  20 mg Oral Daily  . feeding supplement (RESOURCE BREEZE)  1 Container Oral TID BM  . gabapentin  300 mg Oral TID  . metronidazole  500 mg Intravenous Q8H  . pantoprazole  40 mg Oral BID  . polyethylene glycol  17 g Oral Daily  . potassium chloride  40 mEq Oral BID  . prazosin  3 mg Oral QHS  . ritonavir  100 mg Oral Q breakfast  . sodium chloride  3 mL Intravenous Q12H  . sucralfate  1 g Oral TID WC & HS   Continuous Infusions: . sodium chloride 125 mL/hr at 10/18/13 0254    Principal Problem:   Colitis, acute Active Problems:   Abdominal pain   AIDS    Large cell lymphoma   Cirrhosis, non-alcoholic   Pancytopenia   Diarrhea   Nausea and vomiting in adult   Hypokalemia  Acute colitis    Time spent: 81mins    Belkys A Regalado  [facility] Pager [phone]. If 7PM-7AM, please contact night-coverage at [email or web address], password [facility] 10/18/2013, 4:46 PM  LOS: 2 days

## 2013-10-18 NOTE — Progress Notes (Signed)
CRITICAL VALUE ALERT  Critical value received:  WBC=0.8  Date of notification:  10/18/13  Time of notification:  0625  Critical value read back:yes  Nurse who received alert:  Manuela Schwartz, RN  MD notified (1st page):  Dr. Darrick Meigs  Time of first page:  0626  MD notified (2nd page):  Time of second page:  Responding MD:  Dr. Darrick Meigs  Time MD responded:  [id number]. No new orders given at this time. Nursing staff to continue to monitor.

## 2013-10-18 NOTE — Consult Note (Signed)
[facility] Consultation Oncology  Name: Samuel Schultz      MRN: [id number]    Location: A315/A315-01  Date: 10/18/2013 Time:5:07 PM   REFERRING PHYSICIAN:  Elmarie Shiley, MD  REASON FOR CONSULT:  Pancytopenia   DIAGNOSIS:  Progressive pancytopenia  HISTORY OF PRESENT ILLNESS:   Samuel Schultz is a 38 yo Caucasian man who presented to the [facility] ED on 10/16/2013 with a three day history of abdominal discomfort associated with watery diarrhea.  He has a past medical history significant for HIV/AIDs followed by Samuel Schultz at [facility], H/O of [place] treated with R-CHOP in 2010-2011 and was followed up until 2012 by Samuel Schultz (unknown facility in [place], [place]) when she left the practice and the patient failed to seek further follow-up, cirrhosis of liver, history of c.diff 2 years ago, GERD.  Samuel Schultz reports that Samuel Schultz at the [facility] follows him for his HIV/AIDS.  He notes compliance to his anti-retroviral medications.   He was diagnosed with NHL in 2010 and treated with R-CHOP in 2010-2011 by Samuel Schultz.  He reports that he was lost to follow-up after Samuel Schultz left the practice and he failed to follow-up with hematology.  He notes that to his knowledge he remains in remission.  He does not recall the name of the practice/facility that Samuel Schultz saw him at.  Therefore, I have no records.  CareEverywhere does not display records from [place] or [facility] with regards to his hematology care.  Chart is reviewed.  I personally reviewed and went over laboratory results with the patient.  The results are noted within this dictation.  His pancytopenia is progressive since hospitalization and he is displaying a macrocytic anemia.  I personally reviewed and went over radiographic studies with the patient.  The results are noted within this dictation.  CT of abd/pelvis on 10/16/2013 is impressive for the following: 1. Diffuse bowel wall thickening throughout the  colon. The findings can  be seen in colitis either infectious or inflammatory. Vascular  ischemia is much less likely.  2. Changes of cirrhosis of liver for as described. Splenomegaly.  3. Findings suspicious for bone metastasis.  He denies any B symptoms. He denies any blood in his stool, black tarry stool, hemoptysis, hematuria.  He does not hematemesis at home prior to admission in the past.  Hematologically, he otherwise denies any complaints and ROS questioning is negative.    PAST MEDICAL HISTORY:   Past Medical History  Diagnosis Date  . Cancer   . Lymphoma     ALLERGIES: Allergies  Allergen Reactions  . Codeine Anaphylaxis      MEDICATIONS: I have reviewed the patient's current medications.     PAST SURGICAL HISTORY Past Surgical History  Procedure Laterality Date  . Cholecystectomy    . Appendectomy      FAMILY HISTORY: History reviewed. No pertinent family history.  SOCIAL HISTORY:  reports that he has never smoked. He does not have any smokeless tobacco history on file. He reports that he does not drink alcohol or use illicit drugs.  PERFORMANCE STATUS: The patient's performance status is 2 - Symptomatic, <50% confined to bed  PHYSICAL EXAM: Most Recent Vital Signs: Blood pressure 108/54, pulse 92, temperature 98.1 F (36.7 C), temperature source Oral, resp. rate 20, height 5' 9"  (1.753 m), weight 212 lb 1.6 oz (96.208 kg), SpO2 96.00%. General appearance: alert, cooperative, no distress, moderately obese and ill-appearing Head: Normocephalic, without  obvious abnormality, atraumatic Eyes: negative findings: lids and lashes normal, conjunctivae and sclerae normal and corneas clear Neck: supple, symmetrical, trachea midline Lungs: clear to auscultation bilaterally Heart: regular rate and rhythm Abdomen: abnormal findings:  distended, guarding, obese and moderate tenderness in the periumbilical area Extremities: extremities normal, atraumatic, no cyanosis  or edema and ? mild petechial rash on shin Skin: petechiae - lower leg(s) bilateral Neurologic: Grossly normal  LABORATORY DATA:  Results for orders placed during the hospital encounter of 10/16/13 (from the past 48 hour(s))  CULTURE, BLOOD (ROUTINE X 2)     Status: None   Collection Time    10/16/13  6:06 PM      Result Value Ref Range   Specimen Description BLOOD RIGHT ANTECUBITAL     Special Requests       Value: BOTTLES DRAWN AEROBIC AND ANAEROBIC 8CC IMMUNE:COMPROMISED   Culture NO GROWTH 2 DAYS     Report Status PENDING    CULTURE, BLOOD (ROUTINE X 2)     Status: None   Collection Time    10/16/13  6:11 PM      Result Value Ref Range   Specimen Description BLOOD LEFT ANTECUBITAL     Special Requests       Value: BOTTLES DRAWN AEROBIC AND ANAEROBIC 8CC IMMUNE:COMPROMISED   Culture NO GROWTH 2 DAYS     Report Status PENDING    BASIC METABOLIC PANEL     Status: Abnormal   Collection Time    10/17/13  5:14 AM      Result Value Ref Range   Sodium 139  137 - 147 mEq/L   Potassium 4.1  3.7 - 5.3 mEq/L   Comment: DELTA CHECK NOTED   Chloride 111  96 - 112 mEq/L   CO2 22  19 - 32 mEq/L   Glucose, Bld 84  70 - 99 mg/dL   BUN 7  6 - 23 mg/dL   Creatinine, Ser 0.82  0.50 - 1.35 mg/dL   Calcium 7.9 (*) 8.4 - 10.5 mg/dL   GFR calc non Af Amer >90  >90 mL/min   GFR calc Af Amer >90  >90 mL/min   Comment: (NOTE)     The eGFR has been calculated using the CKD EPI equation.     This calculation has not been validated in all clinical situations.     eGFR's persistently <90 mL/min signify possible Chronic Kidney     Disease.  CBC     Status: Abnormal   Collection Time    10/17/13  5:14 AM      Result Value Ref Range   WBC 1.3 (*) 4.0 - 10.5 K/uL   Comment: RESULT REPEATED AND VERIFIED     WHITE COUNT CONFIRMED ON SMEAR     CRITICAL RESULT CALLED TO, READ BACK BY AND VERIFIED WITH:     SARA HEATH RN ON [id number] AT 0630 BY RESSEGGER R   RBC 3.06 (*) 4.22 - 5.81 MIL/uL   Hemoglobin  11.0 (*) 13.0 - 17.0 g/dL   HCT 31.1 (*) 39.0 - 52.0 %   MCV 101.6 (*) 78.0 - 100.0 fL   MCH 35.9 (*) 26.0 - 34.0 pg   MCHC 35.4  30.0 - 36.0 g/dL   RDW 16.9 (*) 11.5 - 15.5 %   Platelets 42 (*) 150 - 400 K/uL   Comment: RESULT REPEATED AND VERIFIED     SPECIMEN CHECKED FOR CLOTS     PLATELET COUNT CONFIRMED BY SMEAR  CBC  WITH DIFFERENTIAL     Status: Abnormal   Collection Time    10/18/13  5:08 AM      Result Value Ref Range   WBC 0.8 (*) 4.0 - 10.5 K/uL   Comment: RESULT REPEATED AND VERIFIED     WHITE COUNT CONFIRMED ON SMEAR     CRITICAL RESULT CALLED TO, READ BACK BY AND VERIFIED WITH:     TISHA BULLOCK RN ON [id number] AT 0625 BY RESSEGGER R   RBC 2.83 (*) 4.22 - 5.81 MIL/uL   Hemoglobin 10.3 (*) 13.0 - 17.0 g/dL   HCT 28.8 (*) 39.0 - 52.0 %   MCV 101.8 (*) 78.0 - 100.0 fL   MCH 36.4 (*) 26.0 - 34.0 pg   MCHC 35.8  30.0 - 36.0 g/dL   RDW 16.4 (*) 11.5 - 15.5 %   Platelets 33 (*) 150 - 400 K/uL   Comment: SPECIMEN CHECKED FOR CLOTS     PLATELET COUNT CONFIRMED BY SMEAR   Neutrophils Relative % 33 (*) 43 - 77 %   Neutro Abs 0.3 (*) 1.7 - 7.7 K/uL   Lymphocytes Relative 46  12 - 46 %   Lymphs Abs 0.4 (*) 0.7 - 4.0 K/uL   Monocytes Relative 12  3 - 12 %   Monocytes Absolute 0.1  0.1 - 1.0 K/uL   Eosinophils Relative 7 (*) 0 - 5 %   Eosinophils Absolute 0.1  0.0 - 0.7 K/uL   Basophils Relative 1  0 - 1 %   Basophils Absolute 0.0  0.0 - 0.1 K/uL  BASIC METABOLIC PANEL     Status: Abnormal   Collection Time    10/18/13  5:08 AM      Result Value Ref Range   Sodium 139  137 - 147 mEq/L   Potassium 3.3 (*) 3.7 - 5.3 mEq/L   Comment: DELTA CHECK NOTED   Chloride 112  96 - 112 mEq/L   CO2 20  19 - 32 mEq/L   Glucose, Bld 148 (*) 70 - 99 mg/dL   BUN 5 (*) 6 - 23 mg/dL   Creatinine, Ser 0.71  0.50 - 1.35 mg/dL   Calcium 7.5 (*) 8.4 - 10.5 mg/dL   GFR calc non Af Amer >90  >90 mL/min   GFR calc Af Amer >90  >90 mL/min   Comment: (NOTE)     The eGFR has been calculated using  the CKD EPI equation.     This calculation has not been validated in all clinical situations.     eGFR's persistently <90 mL/min signify possible Chronic Kidney     Disease.      RADIOGRAPHY:  10/16/2013  CLINICAL DATA: Abdomen pain in nausea vomiting since Saturday.  History of lymphoma.  EXAM:  CT ABDOMEN AND PELVIS WITH CONTRAST  TECHNIQUE:  Multidetector CT imaging of the abdomen and pelvis was performed  using the standard protocol following bolus administration of  intravenous contrast.  CONTRAST: 140m OMNIPAQUE IOHEXOL 300 MG/ML SOLN  COMPARISON: None.  FINDINGS:  There is diffuse fatty infiltration the liver. There is mild nodular  contour with enlargement of left lobe liver. There are enlarged  varices at the splenic hilum. The spleen is enlarged measuring 17.3  cm in length. There is ascites in the abdomen and pelvis. The  patient is status post prior cholecystectomy. There are surgical  clips surrounding the lateral periphery of the liver. The pancreas  is normal. The adrenal glands and kidneys are normal. There  is no  hydronephrosis bilaterally. There is no abdominal lymphadenopathy.  There is umbilical herniation of mesenteric fat.  The small bowel loops are mildly prominent without evidence of frank  obstruction. There is diffuse bowel wall thickening of throughout  the colon. The patient is status post prior appendectomy.  Free fluid is identified within the pelvis. The bladder is  decompressed limiting evaluation. There is no pelvic  lymphadenopathy.  Images of the lung bases demonstrate mild atelectasis of the  posterior lung bases. There is no pulmonary mass or pleural  effusion. Images of the bones demonstrate patchy increased sclerosis  at T7, T10, T11, L3, L5, S1 vertebral bodies as well as mixed  sclerotic and lytic lucencies in bilateral ilium.  IMPRESSION:  Diffuse bowel wall thickening throughout the colon. The findings can  be seen in colitis  either infectious or inflammatory. Vascular  ischemia is much less likely.  Changes of cirrhosis of liver for as described. Splenomegaly.  Findings suspicious for bone metastasis.  Electronically Signed  By: Abelardo Diesel M.D.  On: 10/16/2013 16:07    PATHOLOGY:  None   ASSESSMENT:  1. Pancytopenia, progressive.  Bone marrow disorder versus hypersplenism with cirrhosis versus neutropenic enterocolitis.  No Physical exam evidence of Candida. 2. Macrocytic anemia 3. Leukopenia with neutropenia 4. Thrombocytopenia 5. HIV/AIDS, followed by Samuel Schultz at [facility]  Compliant with medications 6. H/O NHL, treated with R-CHOP in 2010-2011, lost to follow-up since 2012.  Treated by Samuel Schultz un [place], [place] 7. Cirrhosis of liver 8. Splenomegaly 9. Diffuse bowel wall thickening 10. Bone lesions, sclerotic at T7, T10, T11, L3, L5, and S1 with sclerotic and lytic lucencies in the bilateral ilium. ?treated bone mets? 11. Minimal oral candidiasis   PLAN:  1. I personally reviewed and went over laboratory results with the patient.  The results are noted within this dictation. 2. I personally reviewed and went over radiographic studies with the patient.  The results are noted within this dictation.   3. Chart reviewed. 4. Labs ordered: Anemia panel, Epo level, LDH, CD4 count, mRNA HIV by PCR, SPEP with IFE, Light chain assay 5. KOH prep on stool to evaluate for fungal infiltration of colon given HIV/AIDS 6. Neupogen 480 mcg daily 7. Diflucan 400 mg IV for empiric fungal infestation. 8. GI consult recommended and ordered for consideration of colonoscopy versus sigmoidoscopy and/or evaluation of continued watery stool with abdominal pain.  Suspect low platelet count will prevent internal examination but will defer to GI. 9. Recommend fluid resuscitation to prevent hypotension 10. Recommend pRBC transfusion for a Hgb less than 8 g/dL.  Recommend irradiated blood products 11.  Recommend platelet transfusion for a platelet count of 10,000 or less and/or active bleeding.  Irradiated platelets only. 12. Will order a bone marrow aspiration and biopsy tomorrow AM after reviewing labs tomorrow Am and discussing the procedure with the patient.  Neupogen may skew results, however, evaluation for primary and secondary MDS and leukemia is warranted.  13. The patient's progressive pancytopenia with his HIV/AIDS and history of NHL with appropriate treatment is concerning primary or secondary MDS or leukemia.  I will evaluate for other causes including nutritional deficiencies with labs ordered above.  Differential includes MDS/Leukemia primary or secondary versus hypersplenism with cirrhosis versus neutropenic enterocolitis versus systemic/colonic fungal infestation. 14. Will follow along as an inpatient and I anticipate ordering a bone marrow aspiration and biopsy by IR while an inpatient tomorrow after I can explain the procedure and ascertain informed  consult to pursue this procedure.   All questions were answered. The patient knows to call the clinic with any problems, questions or concerns. We can certainly see the patient much sooner if necessary.  Patient and plan discussed with Dr. Farrel Gobble and he is in agreement with the aforementioned.   [facility] 10/18/2013

## 2013-10-19 LAB — BASIC METABOLIC PANEL WITH GFR
BUN: 3 mg/dL — ABNORMAL LOW (ref 6–23)
CO2: 23 meq/L (ref 19–32)
Calcium: 7.9 mg/dL — ABNORMAL LOW (ref 8.4–10.5)
Chloride: 113 meq/L — ABNORMAL HIGH (ref 96–112)
Creatinine, Ser: 0.76 mg/dL (ref 0.50–1.35)
GFR calc Af Amer: >90 mL/min
GFR calc non Af Amer: >90 mL/min
Glucose, Bld: 74 mg/dL (ref 70–99)
Potassium: 3.9 meq/L (ref 3.7–5.3)
Sodium: 143 meq/L (ref 137–147)

## 2013-10-19 LAB — CBC
HCT: 30.7 % — ABNORMAL LOW (ref 39.0–52.0)
Hemoglobin: 10.7 g/dL — ABNORMAL LOW (ref 13.0–17.0)
MCH: 35.4 pg — ABNORMAL HIGH (ref 26.0–34.0)
MCHC: 34.9 g/dL (ref 30.0–36.0)
MCV: 101.7 fL — ABNORMAL HIGH (ref 78.0–100.0)
Platelets: 46 K/uL — ABNORMAL LOW (ref 150–400)
RBC: 3.02 MIL/uL — ABNORMAL LOW (ref 4.22–5.81)
RDW: 16.8 % — ABNORMAL HIGH (ref 11.5–15.5)
WBC: 3.1 K/uL — ABNORMAL LOW (ref 4.0–10.5)

## 2013-10-19 LAB — IRON AND TIBC
Iron: 54 ug/dL (ref 42–135)
Saturation Ratios: 34 % (ref 20–55)
TIBC: 159 ug/dL — ABNORMAL LOW (ref 215–435)
UIBC: 105 ug/dL — ABNORMAL LOW (ref 125–400)

## 2013-10-19 LAB — GI PATHOGEN PANEL BY PCR, STOOL
C DIFFICILE TOXIN A/B: NEGATIVE
Campylobacter by PCR: NEGATIVE
Cryptosporidium by PCR: NEGATIVE
E COLI (ETEC) LT/ST: NEGATIVE
E COLI (STEC): NEGATIVE
E COLI 0157 BY PCR: NEGATIVE
G lamblia by PCR: NEGATIVE
NOROVIRUS G1/G2: NEGATIVE
Rotavirus A by PCR: NEGATIVE
Salmonella by PCR: NEGATIVE
Shigella by PCR: NEGATIVE

## 2013-10-19 LAB — KAPPA/LAMBDA LIGHT CHAINS
KAPPA FREE LGHT CHN: 3.05 mg/dL — AB (ref 0.33–1.94)
Kappa, lambda light chain ratio: 0.89 (ref 0.26–1.65)
Lambda free light chains: 3.43 mg/dL — ABNORMAL HIGH (ref 0.57–2.63)

## 2013-10-19 LAB — VITAMIN B12: Vitamin B-12: 858 pg/mL (ref 211–911)

## 2013-10-19 LAB — FOLATE: Folate: 14.3 ng/mL

## 2013-10-19 LAB — FERRITIN: Ferritin: 189 ng/mL (ref 22–322)

## 2013-10-19 MED ORDER — HYDROMORPHONE HCL PF 1 MG/ML IJ SOLN
1.0000 mg | INTRAMUSCULAR | Status: DC | PRN
  Administered 2013-10-19 – 2013-10-22 (×14): 1 mg via INTRAVENOUS
  Filled 2013-10-19 (×14): qty 1

## 2013-10-19 NOTE — Progress Notes (Signed)
Subjective: Patient seen resting in bed.  Easily aroused.   He reports continued watery stools.  He notes 6-7 watery stools daily.  His usual bowel habits are 1-2 BM that are solid in nature.   I personally reviewed and went over laboratory results with the patient.  The results are noted within this dictation.  GI consulted for continued diarrhea with diffuse bowel wall thickening.  Objective: Vital signs in last 24 hours: Temp:  [98.1 F (36.7 C)-98.5 F (36.9 C)] 98.5 F (36.9 C) (05/14 0510) Pulse Rate:  [86-92] 88 (05/14 0510) Resp:  [20] 20 (05/14 0510) BP: (97-108)/(54-59) 97/59 mmHg (05/14 0510) SpO2:  [95 %-96 %] 95 % (05/14 0510)  Intake/Output from previous day: 05/13 0800 - 05/14 0759 In: 5027.7 [P.O.:2760; I.V.:3004.2; IV Piggyback:900] Out: 2577 [Urine:2425; Stool:152] Intake/Output this shift:    General appearance: alert, cooperative and no distress Resp: clear to auscultation bilaterally Oropharynx: trace oral candidiasis  Lab Results:   Recent Labs  10/18/13 0508 10/19/13 0516  WBC 0.8* 3.1*  HGB 10.3* 10.7*  HCT 28.8* 30.7*  PLT 33* 46*   BMET  Recent Labs  10/18/13 0508 10/19/13 0516  NA 139 143  K 3.3* 3.9  CL 112 113*  CO2 20 23  GLUCOSE 148* 74  BUN 5* 3*  CREATININE 0.71 0.76  CALCIUM 7.5* 7.9*    Studies/Results: No results found.  Medications: I have reviewed the patient's current medications.  Assessment/Plan: 1. Watery stools, 6-7 daily (compared to 1-2 solid/formed stools at baseline).  Negative work-up thus far. 2. Diffuse bowel wall thickening on CT imaging. 3. Pancytopenia, improved with Granix.  Will hold off on bone marrow aspiration and biopsy at present time.  Discussed the risks, benefits, alternatives, and side effects of this procedure discussed.  He remembers having the test performed in the past for his NHL. 4. Macrocytic anemia, vitamin deficiencies ruled out.  Iron studies demonstrate an anemia of chronic  disease picture. 5. Leukopenia, improved on Granix beginning on 10/18/2013 and this will be continued daily.  6. Thrombocytopenia, stable and mildly improved today 7. HIV/AIDS, followed by [name] at [facility]. Compliant with medications 8. H/O NHL, treated with R-CHOP in 2010-2011, lost to follow-up since 2012. Treated by Dr. Nicoletta Dress in [place], [place] 9. Cirrhosis of liver 10. Splenomegaly 11. Bone lesions, sclerotic at T7, T10, T11, L3, L5, and S1 with sclerotic and lytic lucencies in the bilateral ilium. ?treated bone mets? 12. Minimal oral candidiasis, on Diflucan 400 mg IV beginning on 5/13 13. Recommend pRBC transfusion for a Hgb less than 8 g/dL. Recommend irradiated blood products 14. Recommend platelet transfusion for a platelet count of 10,000 or less and/or active bleeding. Irradiated platelets only. 15. Differential includes pancytopenia secondary to 1 or 2 MDS versus 1 or 2 leukemia versus hypersplenism with cirrhosis of liver versus neutropenic enterocolitis versus systemic/colonic fungal infestation.  With improvement in counts, I will hold off on bone marrow aspiration and biopsy, but this procedure was reviewed with the patient.  If counts decline in the future, I will pursue a bone marrow aspiration and biopsy by IR. GI consult pending for his main complaint: watery stools.  Patient and plan discussed with Dr. Farrel Gobble and he is in agreement with the aforementioned.     LOS: 3 days    [facility] 10/19/2013 Pager: [phone]

## 2013-10-19 NOTE — Consult Note (Signed)
Reason for Consult: diarrhea, abdominal pain Referring Physician: Hospitalist services  Samuel Schultz is an 38 y.o. male. Resident of [place]. HPI: Admitted thru the ED Monday with N,V, diarrhea, and abdominal pain. Symptoms since Saturday. He tells me he was having anywhere from 40 stools Saturday and Sunday. Stools were watery, loose.  Ate salmon patties, rice and green beans, and cake around 7pm. Around 2 1/2 hrs later his symptom started. The vomiting occurred afterwards. He says he felt hot. He believes he had a fever.  Temp was not checked at [facility]. Yesterday he had 6 loose stools. There was no blood. He has extreme pain after his BMs. Similar episode in 2008 and was diagnosed with C-diff.  Presently on a clear liquid diet. Hx of AIDs in 2008. He is followed by [name] at [facility] in [place] 3 antiviral medications [facility] daily. 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 Treated at [facility] and followed by Dr. Nicoletta Dress and Dr. Ledell Noss. Has not been followed since 2013. GB in 2013 Dr. 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 Today he feels tired. Appetite is okay. Tolerating liquid diet. No further vomiting.   Takes  Zithromax 652m twice a weeks for AIDs for preventive measures. He says he was on Bactrim but this medication is not listed on his home heds.       Past Medical History  Diagnosis Date  . Cancer   . Lymphoma     Past Surgical History  Procedure Laterality Date  . Cholecystectomy    . Appendectomy      History reviewed. No pertinent family history.  Social History:  reports that he has never smoked. He does not have any smokeless tobacco history on file. He reports that he does not drink alcohol or use illicit drugs.  Allergies:  Allergies  Allergen Reactions  . Codeine Anaphylaxis    Medications: I have reviewed the patient's current  medications.  Results for orders placed during the hospital encounter of 10/16/13 (from the past 48 hour(s))  CBC WITH DIFFERENTIAL     Status: Abnormal   Collection Time    10/18/13  5:08 AM      Result Value Ref Range   WBC 0.8 (*) 4.0 - 10.5 K/uL   Comment: RESULT REPEATED AND VERIFIED     WHITE COUNT CONFIRMED ON SMEAR     CRITICAL RESULT CALLED TO, READ BACK BY AND VERIFIED WITH:     TISHA BULLOCK RN ON [id number] BY RESSEGGER R   RBC 2.83 (*) 4.22 - 5.81 MIL/uL   Hemoglobin 10.3 (*) 13.0 - 17.0 g/dL   HCT 28.8 (*) 39.0 - 52.0 %   MCV 101.8 (*) 78.0 - 100.0 fL   MCH 36.4 (*) 26.0 - 34.0 pg   MCHC 35.8  30.0 - 36.0 g/dL   RDW 16.4 (*) 11.5 - 15.5 %   Platelets 33 (*) 150 - 400 K/uL   Comment: SPECIMEN CHECKED FOR CLOTS     PLATELET COUNT CONFIRMED BY SMEAR   Neutrophils Relative % 33 (*) 43 - 77 %   Neutro Abs 0.3 (*) 1.7 - 7.7 K/uL   Lymphocytes Relative 46  12 - 46 %   Lymphs Abs 0.4 (*) 0.7 - 4.0 K/uL   Monocytes Relative 12  3 - 12 %   Monocytes Absolute 0.1  0.1 - 1.0 K/uL  Eosinophils Relative 7 (*) 0 - 5 %   Eosinophils Absolute 0.1  0.0 - 0.7 K/uL   Basophils Relative 1  0 - 1 %   Basophils Absolute 0.0  0.0 - 0.1 K/uL  BASIC METABOLIC PANEL     Status: Abnormal   Collection Time    10/18/13  5:08 AM      Result Value Ref Range   Sodium 139  137 - 147 mEq/L   Potassium 3.3 (*) 3.7 - 5.3 mEq/L   Comment: DELTA CHECK NOTED   Chloride 112  96 - 112 mEq/L   CO2 20  19 - 32 mEq/L   Glucose, Bld 148 (*) 70 - 99 mg/dL   BUN 5 (*) 6 - 23 mg/dL   Creatinine, Ser 0.71  0.50 - 1.35 mg/dL   Calcium 7.5 (*) 8.4 - 10.5 mg/dL   GFR calc non Af Amer >90  >90 mL/min   GFR calc Af Amer >90  >90 mL/min   Comment: (NOTE)     The eGFR has been calculated using the CKD EPI equation.     This calculation has not been validated in all clinical situations.     eGFR's persistently <90 mL/min signify possible Chronic Kidney     Disease.  RETICULOCYTES     Status: Abnormal    Collection Time    10/18/13  5:08 AM      Result Value Ref Range   Retic Ct Pct 2.2  0.4 - 3.1 %   RBC. 2.06 (*) 4.22 - 5.81 MIL/uL   Retic Count, Manual 45.3  19.0 - 186.0 K/uL  VITAMIN B12     Status: None   Collection Time    10/18/13  5:56 PM      Result Value Ref Range   Vitamin B-12 858  211 - 911 pg/mL   Comment: Performed at [facility]     Status: None   Collection Time    10/18/13  5:56 PM      Result Value Ref Range   Folate 14.3     Comment: (NOTE)     Reference Ranges            Deficient:       0.4 - 3.3 ng/mL            Indeterminate:   3.4 - 5.4 ng/mL            Normal:              > 5.4 ng/mL     Performed at [facility]     Status: Abnormal   Collection Time    10/18/13  5:56 PM      Result Value Ref Range   Iron 54  42 - 135 ug/dL   TIBC 159 (*) 215 - 435 ug/dL   Saturation Ratios 34  20 - 55 %   UIBC 105 (*) 125 - 400 ug/dL   Comment: Performed at [place] [facility]     Status: None   Collection Time    10/18/13  5:56 PM      Result Value Ref Range   Ferritin 189  22 - 322 ng/mL   Comment: Performed at [facility] DEHYDROGENASE     Status: None   Collection Time    10/18/13  5:56 PM      Result Value Ref Range   LDH 218  94 - 250 U/L   Comment: SLIGHT HEMOLYSIS  CBC     Status: Abnormal   Collection Time    10/19/13  5:16 AM      Result Value Ref Range   WBC 3.1 (*) 4.0 - 10.5 K/uL   RBC 3.02 (*) 4.22 - 5.81 MIL/uL   Hemoglobin 10.7 (*) 13.0 - 17.0 g/dL   HCT 30.7 (*) 39.0 - 52.0 %   MCV 101.7 (*) 78.0 - 100.0 fL   MCH 35.4 (*) 26.0 - 34.0 pg   MCHC 34.9  30.0 - 36.0 g/dL   RDW 16.8 (*) 11.5 - 15.5 %   Platelets 46 (*) 150 - 400 K/uL   Comment: SPECIMEN CHECKED FOR CLOTS     PLATELET COUNT CONFIRMED BY SMEAR  BASIC METABOLIC PANEL     Status: Abnormal   Collection Time    10/19/13  5:16 AM      Result Value Ref Range   Sodium 143  137 - 147 mEq/L   Potassium 3.9   3.7 - 5.3 mEq/L   Chloride 113 (*) 96 - 112 mEq/L   CO2 23  19 - 32 mEq/L   Glucose, Bld 74  70 - 99 mg/dL   BUN 3 (*) 6 - 23 mg/dL   Creatinine, Ser 0.76  0.50 - 1.35 mg/dL   Calcium 7.9 (*) 8.4 - 10.5 mg/dL   GFR calc non Af Amer >90  >90 mL/min   GFR calc Af Amer >90  >90 mL/min   Comment: (NOTE)     The eGFR has been calculated using the CKD EPI equation.     This calculation has not been validated in all clinical situations.     eGFR's persistently <90 mL/min signify possible Chronic Kidney     Disease.    No results found.  ROS Blood pressure 97/59, pulse 88, temperature 98.5 F (36.9 C), temperature source Oral, resp. rate 20, height 5' 9"  (1.753 m), weight 212 lb 1.6 oz (96.208 kg), SpO2 95.00%. Physical Exam Alert and oriented. Skin warm and dry. Oral mucosa is moist.   . Sclera anicteric, conjunctivae is pink. Thyroid not enlarged. No cervical lymphadenopathy. Lungs clear. Heart regular rate and rhythm.  Bowel sounds are positive. Abdomen is tense, but soft. (? Guarding). No abdominal masses felt. 1+ edema to lower extremities.    Assessment/Plan: Diarrhea, possible infectious in nature. Stools studies so far are negative. Agree with Cipro and Flagyl IV.  GI pathogen is pending. Will discuss with Dr. Laural Golden. D/C Miralax   Butch Penny 10/19/2013, 8:49 AM    GI attending note; Patient interviewed examined; lab studies as well as abdominopelvic CT revealed. 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 He remains with diarrhea; he may be feeling some better but certainly no worse. Abdominal pelvic CT revealed diffuse wall thickening 2 colonic mucosa suspicious for colitis. It also showed changes of cirrhosis splenomegaly and findings suspicious  for  ? treated bone mets(vertebrae and pelvis). Stool stool culture sofar is negative. Stool for O. and P negative; C. difficile by PCR is negative; smear for cryptosporidium is negative his fecal smear for microsporidia spores and GI pathogen panel are pending. Stool culture remains negative for GI pathogens. Abdominal exam reveals generalized tenderness and splenomegaly. He has pancytopenia  and he has been evaluated by Dr.Formanek and associates. Suspect he has acute infectious colitis. Since he is immune compromised he is also being tested for appropriate infections. Agree with continuing empiric therapy with ciprofloxacin and metronidazole. If all stool studies are negative we'll consider diagnostic flexible sigmoidoscopy with biopsy unless INR high and platelet count drops further. Patient will be reevaluated in a.m.

## 2013-10-19 NOTE — Progress Notes (Signed)
Pain and nausea improved after zofran and dialudid. Drinking broth at this time Tolerating well.

## 2013-10-19 NOTE — Progress Notes (Signed)
[facility] PROGRESS NOTE  Bakary Bramer HYW:[id number] DOB: 1976/01/30 DOA: 10/16/2013 PCP: No primary provider on file.  Assessment/Plan: 1. Acute colitis, likely infectious. Patient does have a history of AIDS. Stool for C. Difficile negative, cryptosporidium negative, ova and parasites were found to be negative.  GI pathogen panel pending. Continue current antibiotics. Continue with IV fluids. GI consulted.   2. AIDS. Continue antiretroviral therapy. He is on Mepron and azithromycin for prophylaxis. Last CD4 count was less than 100. CD 4 and HIV viral load pending.   3. History lymphoma. Was previously treated with chemotherapy. Reports that he is in remission. 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  4. Cirrhosis. Related to lymphoma ?   5. Pancytopenia, leukopenia, neutropenia.Appreciate hematologist assistance. WBC increase with Granix. BM biopsy on hold at this time.   Code Status: full code Family Communication: no family present Disposition Plan: discharge back to facility once improved   Consultants:  GI  ID phone consult.   Hematologist  Procedures:    Antibiotics:  Ciprofloxacin 5/11>>  Flagyl 5/11>>  HPI/Subjective: Still with abdominal pain. Had 6 BM yesterday.   Objective: Filed Vitals:   10/19/13 0510  BP: 97/59  Pulse: 88  Temp: 98.5 F (36.9 C)  Resp: 20    Intake/Output Summary (Last 24 hours) at 10/19/13 1303 Last data filed at 10/19/13 0947  Gross per 24 hour  Intake 5704.17 ml  Output   1851 ml  Net 3853.17 ml   Filed Weights   10/16/13 1111 10/16/13 2051  Weight: 95.255 kg (210 lb) 96.208 kg (212 lb 1.6 oz)    Exam:   General:  No acute distress  Cardiovascular: S1, S2, regular rate and rhythm  Respiratory: Clear to auscultation bilaterally  Abdomen: Soft, tender in the lower abdomen, positive bowel sounds  Musculoskeletal: No pedal edema bilaterally   Data Reviewed: Basic Metabolic  Panel:  Recent Labs Lab 10/16/13 1149 10/17/13 0514 10/18/13 0508 10/19/13 0516  NA 141 139 139 143  K 3.2* 4.1 3.3* 3.9  CL 109 111 112 113*  CO2 21 22 20 23   GLUCOSE 91 84 148* 74  BUN 7 7 5* 3*  CREATININE 0.71 0.82 0.71 0.76  CALCIUM 8.5 7.9* 7.5* 7.9*   Liver Function Tests:  Recent Labs Lab 10/16/13 1149  AST 45*  ALT 23  ALKPHOS 92  BILITOT 3.3*  PROT 6.5  ALBUMIN 2.6*    Recent Labs Lab 10/16/13 1149  LIPASE 133*   No results found for this basename: AMMONIA,  in the last 168 hours CBC:  Recent Labs Lab 10/16/13 1149 10/17/13 0514 10/18/13 0508 10/19/13 0516  WBC 3.5* 1.3* 0.8* 3.1*  NEUTROABS 2.2  --  0.3*  --   HGB 13.0 11.0* 10.3* 10.7*  HCT 35.7* 31.1* 28.8* 30.7*  MCV 99.4 101.6* 101.8* 101.7*  PLT 58* 42* 33* 46*   Cardiac Enzymes: No results found for this basename: CKTOTAL, CKMB, CKMBINDEX, TROPONINI,  in the last 168 hours BNP (last 3 results) No results found for this basename: PROBNP,  in the last 8760 hours CBG: No results found for this basename: GLUCAP,  in the last 168 hours  Recent Results (from the past 240 hour(s))  CLOSTRIDIUM DIFFICILE BY PCR     Status: None   Collection Time    10/16/13  3:48 PM      Result Value Ref Range Status   C difficile by pcr NEGATIVE  NEGATIVE Final  STOOL CULTURE  Status: None   Collection Time    10/16/13  3:48 PM      Result Value Ref Range Status   Specimen Description STOOL   Final   Special Requests IMMUNE:COMPROMISED   Final   Culture     Final   Value: NO SUSPICIOUS COLONIES, CONTINUING TO HOLD     Performed at [facility]   Report Status PENDING   Incomplete  OVA AND PARASITE EXAMINATION     Status: None   Collection Time    10/16/13  3:48 PM      Result Value Ref Range Status   Specimen Description STOOL   Final   Special Requests IMMUNE:COMPROMISED   Final   Ova and parasites     Final   Value: NO OVA OR PARASITES SEEN     Performed at [facility]    Report Status 10/17/2013 FINAL   Final  CRYPTOSPORIDIUM SMEAR, FECAL     Status: None   Collection Time    10/16/13  3:48 PM      Result Value Ref Range Status   Specimen Description STOOL   Final   Special Requests NONE   Final   Cryptosporidium Smear.     Final   Value: NO Cryptosporidium Cyclospora or Isospora seen.     Performed at [facility]   Report Status 10/17/2013 FINAL   Final  CULTURE, BLOOD (ROUTINE X 2)     Status: None   Collection Time    10/16/13  6:06 PM      Result Value Ref Range Status   Specimen Description BLOOD RIGHT ANTECUBITAL   Final   Special Requests     Final   Value: BOTTLES DRAWN AEROBIC AND ANAEROBIC 8CC IMMUNE:COMPROMISED   Culture NO GROWTH 3 DAYS   Final   Report Status PENDING   Incomplete  CULTURE, BLOOD (ROUTINE X 2)     Status: None   Collection Time    10/16/13  6:11 PM      Result Value Ref Range Status   Specimen Description BLOOD LEFT ANTECUBITAL   Final   Special Requests     Final   Value: BOTTLES DRAWN AEROBIC AND ANAEROBIC 8CC IMMUNE:COMPROMISED   Culture NO GROWTH 3 DAYS   Final   Report Status PENDING   Incomplete     Studies: No results found.  Scheduled Meds: . atovaquone  1,500 mg Oral Daily  . azithromycin  1,200 mg Oral Q7 days  . ciprofloxacin  400 mg Intravenous Q12H  . Darunavir Ethanolate  800 mg Oral Q breakfast  . emtricitabine-tenofovir  1 tablet Oral QHS  . escitalopram  20 mg Oral Daily  . feeding supplement (RESOURCE BREEZE)  1 Container Oral TID BM  . fluconazole (DIFLUCAN) IV  400 mg Intravenous Q24H  . gabapentin  300 mg Oral TID  . metronidazole  500 mg Intravenous Q8H  . pantoprazole  40 mg Oral BID  . prazosin  1 mg Oral QHS  . ritonavir  100 mg Oral Q breakfast  . sodium chloride  3 mL Intravenous Q12H  . sucralfate  1 g Oral TID WC & HS   Continuous Infusions: . sodium chloride 125 mL/hr at 10/19/13 3810    Principal Problem:   Colitis, acute Active Problems:   Abdominal  pain   AIDS   Large cell lymphoma   Cirrhosis, non-alcoholic   Pancytopenia   Diarrhea   Nausea and vomiting in adult  Hypokalemia   Acute colitis    Time spent: 44mins    Rewa Weissberg A Krithi Bray  [facility] Pager [phone]. If 7PM-7AM, please contact night-coverage at [email or web address], password [facility] 10/19/2013, 1:03 PM  LOS: 3 days

## 2013-10-20 ENCOUNTER — Inpatient Hospital Stay (HOSPITAL_COMMUNITY): Payer: Medicaid Other

## 2013-10-20 DIAGNOSIS — R161 Splenomegaly, not elsewhere classified: Secondary | ICD-10-CM

## 2013-10-20 DIAGNOSIS — D72819 Decreased white blood cell count, unspecified: Secondary | ICD-10-CM

## 2013-10-20 DIAGNOSIS — D61818 Other pancytopenia: Secondary | ICD-10-CM

## 2013-10-20 DIAGNOSIS — M949 Disorder of cartilage, unspecified: Secondary | ICD-10-CM

## 2013-10-20 DIAGNOSIS — D696 Thrombocytopenia, unspecified: Secondary | ICD-10-CM

## 2013-10-20 DIAGNOSIS — D509 Iron deficiency anemia, unspecified: Secondary | ICD-10-CM

## 2013-10-20 DIAGNOSIS — B370 Candidal stomatitis: Secondary | ICD-10-CM

## 2013-10-20 DIAGNOSIS — M899 Disorder of bone, unspecified: Secondary | ICD-10-CM

## 2013-10-20 LAB — CBC WITH DIFFERENTIAL/PLATELET
BASOS PCT: 0 % (ref 0–1)
Basophils Absolute: 0 10*3/uL (ref 0.0–0.1)
Eosinophils Absolute: 0.1 10*3/uL (ref 0.0–0.7)
Eosinophils Relative: 4 % (ref 0–5)
HEMATOCRIT: 29 % — AB (ref 39.0–52.0)
Hemoglobin: 10.2 g/dL — ABNORMAL LOW (ref 13.0–17.0)
Lymphocytes Relative: 21 % (ref 12–46)
Lymphs Abs: 0.5 10*3/uL — ABNORMAL LOW (ref 0.7–4.0)
MCH: 35.5 pg — ABNORMAL HIGH (ref 26.0–34.0)
MCHC: 35.2 g/dL (ref 30.0–36.0)
MCV: 101 fL — AB (ref 78.0–100.0)
MONO ABS: 0.3 10*3/uL (ref 0.1–1.0)
Monocytes Relative: 13 % — ABNORMAL HIGH (ref 3–12)
Neutro Abs: 1.6 10*3/uL — ABNORMAL LOW (ref 1.7–7.7)
Neutrophils Relative %: 63 % (ref 43–77)
Platelets: 37 10*3/uL — ABNORMAL LOW (ref 150–400)
RBC: 2.87 MIL/uL — ABNORMAL LOW (ref 4.22–5.81)
RDW: 16.9 % — ABNORMAL HIGH (ref 11.5–15.5)
WBC: 2.5 10*3/uL — ABNORMAL LOW (ref 4.0–10.5)

## 2013-10-20 LAB — IMMUNOFIXATION ELECTROPHORESIS
IGA: 454 mg/dL — AB (ref 68–379)
IGG (IMMUNOGLOBIN G), SERUM: 1840 mg/dL — AB (ref 650–1600)
IGM, SERUM: 141 mg/dL (ref 41–251)
Total Protein ELP: 5.3 g/dL — ABNORMAL LOW (ref 6.0–8.3)

## 2013-10-20 LAB — STOOL CULTURE

## 2013-10-20 LAB — PROTEIN ELECTROPHORESIS, SERUM
ALBUMIN ELP: 53.5 % — AB (ref 55.8–66.1)
ALPHA-2-GLOBULIN: 5.1 % — AB (ref 7.1–11.8)
Alpha-1-Globulin: 2.6 % — ABNORMAL LOW (ref 2.9–4.9)
BETA GLOBULIN: 4.1 % — AB (ref 4.7–7.2)
Beta 2: 6.4 % (ref 3.2–6.5)
Gamma Globulin: 28.3 % — ABNORMAL HIGH (ref 11.1–18.8)
M-Spike, %: NOT DETECTED g/dL
Total Protein ELP: 5.5 g/dL — ABNORMAL LOW (ref 6.0–8.3)

## 2013-10-20 LAB — BASIC METABOLIC PANEL
BUN: 3 mg/dL — AB (ref 6–23)
CO2: 21 meq/L (ref 19–32)
CREATININE: 0.65 mg/dL (ref 0.50–1.35)
Calcium: 7.9 mg/dL — ABNORMAL LOW (ref 8.4–10.5)
Chloride: 111 mEq/L (ref 96–112)
GFR calc non Af Amer: >90 mL/min (ref >90)
Glucose, Bld: 78 mg/dL (ref 70–99)
Potassium: 3.5 mEq/L — ABNORMAL LOW (ref 3.7–5.3)
Sodium: 140 mEq/L (ref 137–147)

## 2013-10-20 LAB — T-HELPER CELLS (CD4) COUNT (NOT AT ARMC)
CD4 % Helper T Cell: 4 % — ABNORMAL LOW (ref 33–55)
CD4 T Cell Abs: 20 /uL — ABNORMAL LOW (ref 400–2700)

## 2013-10-20 LAB — PROTIME-INR
INR: 2.26 — ABNORMAL HIGH (ref 0.00–1.49)
Prothrombin Time: 24.2 seconds — ABNORMAL HIGH (ref 11.6–15.2)

## 2013-10-20 LAB — ERYTHROPOIETIN: ERYTHROPOIETIN: 101.8 m[IU]/mL — AB (ref 2.6–18.5)

## 2013-10-20 MED ORDER — SODIUM CHLORIDE 0.9 % IV SOLN
5.0000 mg/kg | Freq: Two times a day (BID) | INTRAVENOUS | Status: DC
  Administered 2013-10-20 – 2013-10-22 (×4): 480 mg via INTRAVENOUS
  Filled 2013-10-20 (×6): qty 480

## 2013-10-20 MED ORDER — TBO-FILGRASTIM 480 MCG/0.8ML ~~LOC~~ SOSY
480.0000 ug | PREFILLED_SYRINGE | Freq: Every day | SUBCUTANEOUS | Status: DC
  Administered 2013-10-20 – 2013-10-22 (×3): 480 ug via SUBCUTANEOUS
  Filled 2013-10-20 (×4): qty 0.8

## 2013-10-20 MED ORDER — POTASSIUM CHLORIDE CRYS ER 20 MEQ PO TBCR
40.0000 meq | EXTENDED_RELEASE_TABLET | Freq: Two times a day (BID) | ORAL | Status: AC
  Administered 2013-10-20 (×2): 40 meq via ORAL
  Filled 2013-10-20 (×2): qty 2

## 2013-10-20 MED ORDER — VITAMIN K1 10 MG/ML IJ SOLN
10.0000 mg | Freq: Once | INTRAMUSCULAR | Status: AC
  Administered 2013-10-20: 10 mg via SUBCUTANEOUS
  Filled 2013-10-20: qty 1

## 2013-10-20 NOTE — Progress Notes (Signed)
Subjective: Patient not feeling much better.   He denies any bleeding.  He is a candidate for a bone marrow aspiration and biopsy.  I have reviewed the role of this test and the process for this procedure.  He is agreeable.  I suspect we can get him scheduled for a bone marrow aspiration and biopsy on Monday.  Order is placed.   Objective: Vital signs in last 24 hours: Temp:  [97.8 F (36.6 C)-98.4 F (36.9 C)] 97.8 F (36.6 C) (05/15 1326) Pulse Rate:  [73-93] 73 (05/15 1326) Resp:  [20] 20 (05/15 1326) BP: (99-116)/(60-68) 107/68 mmHg (05/15 1326) SpO2:  [97 %-99 %] 97 % (05/15 1326)  Intake/Output from previous day: 05/14 0800 - 05/15 0759 In: 2710 [P.O.:960; I.V.:1250; IV Piggyback:500] Out: 1850 [Urine:1850] Intake/Output this shift: Total I/O In: 360 [P.O.:360] Out: 300 [Urine:300]  General appearance: alert, cooperative and fatigued Extremities: petechial rash on lower extremities, non blanchable. Oropharynx: petechiae on palate  Lab Results:   Recent Labs  10/19/13 0516 10/20/13 0517  WBC 3.1* 2.5*  HGB 10.7* 10.2*  HCT 30.7* 29.0*  PLT 46* 37*   BMET  Recent Labs  10/19/13 0516 10/20/13 0517  NA 143 140  K 3.9 3.5*  CL 113* 111  CO2 23 21  GLUCOSE 74 78  BUN 3* 3*  CREATININE 0.76 0.65  CALCIUM 7.9* 7.9*    Studies/Results: US Venous Img Upper Uni Right  10/20/2013   CLINICAL DATA:  Right arm swelling  EXAM: RIGHT UPPER EXTREMITY VENOUS DOPPLER ULTRASOUND  TECHNIQUE: Gray-scale sonography with graded compression, as well as color Doppler and duplex ultrasound were performed to evaluate the upper extremity deep venous system from the level of the subclavian vein and including the jugular, axillary, basilic and upper cephalic vein. Spectral Doppler was utilized to evaluate flow at rest and with distal augmentation maneuvers.  COMPARISON:  None.  FINDINGS: Thrombus within deep veins:  None visualized.  Compressibility of deep veins:  Normal.  Duplex  waveform respiratory phasicity:  Normal.  Duplex waveform response to augmentation:  Normal.  Venous reflux:  None visualized.  Other findings: There is nonocclusive, incompletely compressible mural thrombus in the cephalic vein at the level of the elbow with patent persistent flow through this segment. The more central aspect of the cephalic vein is widely patent and compressible. There is subcutaneous edema in the wrist and upper arm.  IMPRESSION: 1. Negative for right upper extremity DVT. 2. Segmental superficial thrombophlebitis involving cephalic vein.   Electronically Signed   By: Arne Cleveland M.D.   On: 10/20/2013 11:45    Medications: I have reviewed the patient's current medications.  Assessment/Plan: 1. Watery stools, 6-7 daily (compared to 1-2 solid/formed stools at baseline). Negative work-up thus far.  Followed by GI. 2. Diffuse bowel wall thickening on CT imaging. Followed by GI 3. Pancytopenia, improved with Granix. Stable today (10/20/2013) and therefore we will pursue a CT guided bone marrow aspiration and biopsy by IR. Discussed the risks, benefits, alternatives, and side effects of this procedure discussed. He remembers having the test performed in the past for his NHL.  4. Macrocytic anemia, vitamin deficiencies ruled out. Iron studies demonstrate an anemia of chronic disease picture.  5. Leukopenia, improved on Granix beginning on 10/18/2013 and this will be continued daily.  6. Thrombocytopenia, stable. 7. HIV/AIDS, followed by [name] at [facility]. Compliant with medications  8. H/O NHL, treated with R-CHOP in 2010-2011, lost to follow-up since 2012. Treated  by Dr. Nicoletta Dress in [place], [place]  9. Cirrhosis of liver  10. Splenomegaly  11. Bone lesions, sclerotic at T7, T10, T11, L3, L5, and S1 with sclerotic and lytic lucencies in the bilateral ilium. ?treated bone mets?  12. Minimal oral candidiasis, resolved, on Diflucan 400 mg IV beginning on 5/13.  Recommend  empiric continuation. 13. Recommend pRBC transfusion for a Hgb less than 8 g/dL. Recommend irradiated blood products  14. Recommend platelet transfusion for a platelet count of 10,000 or less and/or active bleeding. Irradiated platelets only.  15. Differential includes pancytopenia secondary to 1 or 2 MDS versus 1 or 2 leukemia versus hypersplenism with cirrhosis of liver versus neutropenic enterocolitis versus systemic/colonic fungal infestation. Without continued improvement in counts, bone marrow aspiration and biopsy is warranted.  This procedure was reviewed with the patient. Patient's case discussed with Juliann Pulse in IR and she will work with the PA to get this procedure arranged for Monday.    Patient and plan discussed with Dr. Farrel Gobble and he is in agreement with the aforementioned.     LOS: 4 days    [facility] 10/20/2013

## 2013-10-20 NOTE — Progress Notes (Signed)
[facility] PROGRESS NOTE  Samuel Schultz UDJ:[id number] DOB: [birth year] DOA: 10/16/2013 PCP: No primary provider on file.  Assessment/Plan: 1. Acute colitis, likely infectious. Patient does have a history of AIDS. Stool for C. Difficile negative, cryptosporidium negative, ova and parasites were found to be negative. Continue current antibiotics. Continue with IV fluids.  GI pathogen negative. Plan to give vitamin K and repeat INR in am to see if sigmoidoscopy can be done. CD 4 less than 20. Will try ganciclovir. Will discussed with Dr Laural Golden first.   2. AIDS. Continue antiretroviral therapy. He is on Mepron and azithromycin for prophylaxis. Last CD4 count was less than 100. CD 4  Less than 20. HIV viral load pending.   3. History lymphoma. Was previously treated with chemotherapy. Reports that he is in remission. 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  4. Cirrhosis. Related to lymphoma . INR elevated at 2.2. Vitamin K ordered.   5. Pancytopenia, leukopenia, neutropenia.Appreciate hematologist assistance. WBC increase with Granix. BM biopsy on Monday. Granix daily.  6. Right arm swelling; [place] negative for DVT. Thrombophlebitis.   Code Status: full code Family Communication: no family present Disposition Plan: discharge back to facility once improved   Consultants:  GI  ID phone consult.   Hematologist  Procedures:    Antibiotics:  Ciprofloxacin 5/11>>  Flagyl 5/11>>  HPI/Subjective: Still with abdominal pain. Had 6 BM yesterday.   Objective: Filed Vitals:   10/20/13 1326  BP: 107/68  Pulse: 73  Temp: 97.8 F (36.6 C)  Resp: 20    Intake/Output Summary (Last 24 hours) at 10/20/13 1401 Last data filed at 10/20/13 1213  Gross per 24 hour  Intake   2050 ml  Output   1500 ml  Net    550 ml   Filed Weights   10/16/13 1111 10/16/13 2051  Weight: 95.255 kg (210 lb) 96.208 kg (212 lb 1.6 oz)    Exam:   General:  No acute  distress  Cardiovascular: S1, S2, regular rate and rhythm  Respiratory: Clear to auscultation bilaterally  Abdomen: Soft, tender in the lower abdomen, positive bowel sounds  Musculoskeletal: No pedal edema bilaterally   Data Reviewed: Basic Metabolic Panel:  Recent Labs Lab 10/16/13 1149 10/17/13 0514 10/18/13 0508 10/19/13 0516 10/20/13 0517  NA 141 139 139 143 140  K 3.2* 4.1 3.3* 3.9 3.5*  CL 109 111 112 113* 111  CO2 21 22 20 23 21   GLUCOSE 91 84 148* 74 78  BUN 7 7 5* 3* 3*  CREATININE 0.71 0.82 0.71 0.76 0.65  CALCIUM 8.5 7.9* 7.5* 7.9* 7.9*   Liver Function Tests:  Recent Labs Lab 10/16/13 1149  AST 45*  ALT 23  ALKPHOS 92  BILITOT 3.3*  PROT 6.5  ALBUMIN 2.6*    Recent Labs Lab 10/16/13 1149  LIPASE 133*   No results found for this basename: AMMONIA,  in the last 168 hours CBC:  Recent Labs Lab 10/16/13 1149 10/17/13 0514 10/18/13 0508 10/19/13 0516 10/20/13 0517  WBC 3.5* 1.3* 0.8* 3.1* 2.5*  NEUTROABS 2.2  --  0.3*  --  1.6*  HGB 13.0 11.0* 10.3* 10.7* 10.2*  HCT 35.7* 31.1* 28.8* 30.7* 29.0*  MCV 99.4 101.6* 101.8* 101.7* 101.0*  PLT 58* 42* 33* 46* 37*   Cardiac Enzymes: No results found for this basename: CKTOTAL, CKMB, CKMBINDEX, TROPONINI,  in the last 168 hours BNP (last 3 results) No results found for this basename: PROBNP,  in the last 8760 hours  CBG: No results found for this basename: GLUCAP,  in the last 168 hours  Recent Results (from the past 240 hour(s))  CLOSTRIDIUM DIFFICILE BY PCR     Status: None   Collection Time    10/16/13  3:48 PM      Result Value Ref Range Status   C difficile by pcr NEGATIVE  NEGATIVE Final  STOOL CULTURE     Status: None   Collection Time    10/16/13  3:48 PM      Result Value Ref Range Status   Specimen Description STOOL   Final   Special Requests IMMUNE:COMPROMISED   Final   Culture     Final   Value: NO SALMONELLA, SHIGELLA, CAMPYLOBACTER, YERSINIA, OR E.COLI 0157:H7 ISOLATED      Performed at [facility]   Report Status 10/20/2013 FINAL   Final  OVA AND PARASITE EXAMINATION     Status: None   Collection Time    10/16/13  3:48 PM      Result Value Ref Range Status   Specimen Description STOOL   Final   Special Requests IMMUNE:COMPROMISED   Final   Ova and parasites     Final   Value: NO OVA OR PARASITES SEEN     Performed at [facility]   Report Status 10/17/2013 FINAL   Final  CRYPTOSPORIDIUM SMEAR, FECAL     Status: None   Collection Time    10/16/13  3:48 PM      Result Value Ref Range Status   Specimen Description STOOL   Final   Special Requests NONE   Final   Cryptosporidium Smear.     Final   Value: NO Cryptosporidium Cyclospora or Isospora seen.     Performed at [facility]   Report Status 10/17/2013 FINAL   Final  CULTURE, BLOOD (ROUTINE X 2)     Status: None   Collection Time    10/16/13  6:06 PM      Result Value Ref Range Status   Specimen Description BLOOD RIGHT ANTECUBITAL   Final   Special Requests     Final   Value: BOTTLES DRAWN AEROBIC AND ANAEROBIC 8CC IMMUNE:COMPROMISED   Culture NO GROWTH 3 DAYS   Final   Report Status PENDING   Incomplete  CULTURE, BLOOD (ROUTINE X 2)     Status: None   Collection Time    10/16/13  6:11 PM      Result Value Ref Range Status   Specimen Description BLOOD LEFT ANTECUBITAL   Final   Special Requests     Final   Value: BOTTLES DRAWN AEROBIC AND ANAEROBIC 8CC IMMUNE:COMPROMISED   Culture NO GROWTH 3 DAYS   Final   Report Status PENDING   Incomplete     Studies: US Venous Img Upper Uni Right  10/20/2013   CLINICAL DATA:  Right arm swelling  EXAM: RIGHT UPPER EXTREMITY VENOUS DOPPLER ULTRASOUND  TECHNIQUE: Gray-scale sonography with graded compression, as well as color Doppler and duplex ultrasound were performed to evaluate the upper extremity deep venous system from the level of the subclavian vein and including the jugular, axillary, basilic and upper cephalic vein.  Spectral Doppler was utilized to evaluate flow at rest and with distal augmentation maneuvers.  COMPARISON:  None.  FINDINGS: Thrombus within deep veins:  None visualized.  Compressibility of deep veins:  Normal.  Duplex waveform respiratory phasicity:  Normal.  Duplex waveform response to augmentation:  Normal.  Venous reflux:  None visualized.  Other findings: There is nonocclusive, incompletely compressible mural thrombus in the cephalic vein at the level of the elbow with patent persistent flow through this segment. The more central aspect of the cephalic vein is widely patent and compressible. There is subcutaneous edema in the wrist and upper arm.  IMPRESSION: 1. Negative for right upper extremity DVT. 2. Segmental superficial thrombophlebitis involving cephalic vein.   Electronically Signed   By: Arne Cleveland M.D.   On: 10/20/2013 11:45    Scheduled Meds: . atovaquone  1,500 mg Oral Daily  . azithromycin  1,200 mg Oral Q7 days  . ciprofloxacin  400 mg Intravenous Q12H  . Darunavir Ethanolate  800 mg Oral Q breakfast  . emtricitabine-tenofovir  1 tablet Oral QHS  . escitalopram  20 mg Oral Daily  . feeding supplement (RESOURCE BREEZE)  1 Container Oral TID BM  . fluconazole (DIFLUCAN) IV  400 mg Intravenous Q24H  . gabapentin  300 mg Oral TID  . metronidazole  500 mg Intravenous Q8H  . pantoprazole  40 mg Oral BID  . potassium chloride  40 mEq Oral BID  . prazosin  1 mg Oral QHS  . ritonavir  100 mg Oral Q breakfast  . sodium chloride  3 mL Intravenous Q12H  . sucralfate  1 g Oral TID WC & HS  . Tbo-Filgrastim  480 mcg Subcutaneous q1800   Continuous Infusions: . sodium chloride 125 mL/hr at 10/20/13 2778    Principal Problem:   Colitis, acute Active Problems:   Abdominal pain   AIDS   Large cell lymphoma   Cirrhosis, non-alcoholic   Pancytopenia   Diarrhea   Nausea and vomiting in adult   Hypokalemia   Acute colitis    Time spent: 24mins    Teja Judice A  Kimiyo Carmicheal  [facility] Pager [phone]. If 7PM-7AM, please contact night-coverage at [email or web address], password [facility] 10/20/2013, 2:01 PM  LOS: 4 days

## 2013-10-20 NOTE — Progress Notes (Signed)
ANTIBIOTIC CONSULT NOTE - INITIAL  Pharmacy Consult for Ganciclovir Indication: CMV Colitis  Allergies  Allergen Reactions  . Codeine Anaphylaxis    Patient Measurements: Height: 5\' 9"  (175.3 cm) Weight: 212 lb 1.6 oz (96.208 kg) IBW/kg (Calculated) : 70.7  Vital Signs: Temp: 97.8 F (36.6 C) (05/15 1326) Temp src: Oral (05/15 1326) BP: 107/68 mmHg (05/15 1326) Pulse Rate: 73 (05/15 1326) Intake/Output from previous day: 05/14 0701 - 05/15 0700 In: 2710 [P.O.:960; I.V.:1250; IV Piggyback:500] Out: 1850 [Urine:1850] Intake/Output from this shift: Total I/O In: 360 [P.O.:360] Out: 300 [Urine:300]  Labs:  Recent Labs  10/18/13 0508 10/19/13 0516 10/20/13 0517  WBC 0.8* 3.1* 2.5*  HGB 10.3* 10.7* 10.2*  PLT 33* 46* 37*  CREATININE 0.71 0.76 0.65   Estimated Creatinine Clearance: 143.3 ml/min (by C-G formula based on Cr of 0.65). No results found for this basename: VANCOTROUGH, Corlis Leak, VANCORANDOM, [place], GENTPEAK, GENTRANDOM, TOBRATROUGH, TOBRAPEAK, TOBRARND, AMIKACINPEAK, AMIKACINTROU, AMIKACIN,  in the last 72 hours   Microbiology: Recent Results (from the past 720 hour(s))  CLOSTRIDIUM DIFFICILE BY PCR     Status: None   Collection Time    10/16/13  3:48 PM      Result Value Ref Range Status   C difficile by pcr NEGATIVE  NEGATIVE Final  STOOL CULTURE     Status: None   Collection Time    10/16/13  3:48 PM      Result Value Ref Range Status   Specimen Description STOOL   Final   Special Requests IMMUNE:COMPROMISED   Final   Culture     Final   Value: NO SALMONELLA, SHIGELLA, CAMPYLOBACTER, YERSINIA, OR E.COLI 0157:H7 ISOLATED     Performed at [facility]   Report Status 10/20/2013 FINAL   Final  OVA AND PARASITE EXAMINATION     Status: None   Collection Time    10/16/13  3:48 PM      Result Value Ref Range Status   Specimen Description STOOL   Final   Special Requests IMMUNE:COMPROMISED   Final   Ova and parasites     Final   Value:  NO OVA OR PARASITES SEEN     Performed at [facility]   Report Status 10/17/2013 FINAL   Final  CRYPTOSPORIDIUM SMEAR, FECAL     Status: None   Collection Time    10/16/13  3:48 PM      Result Value Ref Range Status   Specimen Description STOOL   Final   Special Requests NONE   Final   Cryptosporidium Smear.     Final   Value: NO Cryptosporidium Cyclospora or Isospora seen.     Performed at [facility]   Report Status 10/17/2013 FINAL   Final  CULTURE, BLOOD (ROUTINE X 2)     Status: None   Collection Time    10/16/13  6:06 PM      Result Value Ref Range Status   Specimen Description BLOOD RIGHT ANTECUBITAL   Final   Special Requests     Final   Value: BOTTLES DRAWN AEROBIC AND ANAEROBIC 8CC IMMUNE:COMPROMISED   Culture NO GROWTH 3 DAYS   Final   Report Status PENDING   Incomplete  CULTURE, BLOOD (ROUTINE X 2)     Status: None   Collection Time    10/16/13  6:11 PM      Result Value Ref Range Status   Specimen Description BLOOD LEFT ANTECUBITAL   Final   Special Requests  Final   Value: BOTTLES DRAWN AEROBIC AND ANAEROBIC 8CC IMMUNE:COMPROMISED   Culture NO GROWTH 3 DAYS   Final   Report Status PENDING   Incomplete    Medical History: Past Medical History  Diagnosis Date  . Cancer   . Lymphoma     Medications:  Scheduled:  . atovaquone  1,500 mg Oral Daily  . azithromycin  1,200 mg Oral Q7 days  . ciprofloxacin  400 mg Intravenous Q12H  . Darunavir Ethanolate  800 mg Oral Q breakfast  . emtricitabine-tenofovir  1 tablet Oral QHS  . escitalopram  20 mg Oral Daily  . feeding supplement (RESOURCE BREEZE)  1 Container Oral TID BM  . fluconazole (DIFLUCAN) IV  400 mg Intravenous Q24H  . gabapentin  300 mg Oral TID  . metronidazole  500 mg Intravenous Q8H  . pantoprazole  40 mg Oral BID  . potassium chloride  40 mEq Oral BID  . prazosin  1 mg Oral QHS  . ritonavir  100 mg Oral Q breakfast  . sodium chloride  3 mL Intravenous Q12H  . sucralfate   1 g Oral TID WC & HS  . Tbo-Filgrastim  480 mcg Subcutaneous q1800   Assessment: 38 yo M admitted with acute colitis, likely infectious.  Work-up negative to date.  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   Excellent renal function.   Ganciclovir 5/15>> Cipro/Flagyl 5/11>> Diflucan 5/13>>  Goal of Therapy:  Eradicate infection.  Plan:  Ganciclovir 480mg  IV q12h (5mg /kg) F/U renal function, CBC, & patient progress Change to oral once appropriate  Samuel Schultz 10/20/2013,3:35 PM

## 2013-10-20 NOTE — Progress Notes (Addendum)
Patient ID: Samuel Schultz, male   DOB: Oct 26, 1975, 38 y.o.   MRN: [id number] States he feels about the same. He had 5 loose, watery stools yesterday. No BM as of yet today. Platelets are down to 37. INR is up. Kept diet down yesterday. No fever. He says no change as far as his abdominal pain is concerned. GI pathogen negative.  Filed Vitals:   10/19/13 0510 10/19/13 1554 10/19/13 2111 10/20/13 0516  BP: 97/59 99/60 116/64 110/62  Pulse: 88 93 87 75  Temp: 98.5 F (36.9 C) 98.3 F (36.8 C) 98.4 F (36.9 C) 98.1 F (36.7 C)  TempSrc: Oral Oral Oral Oral  Resp: 20 20 20 20   Height:      Weight:      SpO2: 95% 98% 99% 98%   Alert. BS+. Abdomen soft.diffuse tenderness. Assessment:  Colitis. Stool studies are negative. Agree with continuing cipro and Flagyl. Platelet ct 37. PT/INR elevated. Will continue to monitor.    GI attending note; Patient has coagulopathy with INR of 2.26. This could be secondary to underlying chronic liver disease or deficiency. Will hold off flexible sigmoidoscopy. Vitamin K 10 mg subcutaneous x1 and repeat INR in a.m. Patient's condition discussed with Dr. Tyrell Antonio.

## 2013-10-20 NOTE — Progress Notes (Addendum)
Patient ID: Samuel Schultz, male   DOB: Aug 14, 1975, 38 y.o.   MRN: [id number]   Pt with pancytopenia Scheduled for BM bx at [facility] 5/18  To be at [facility] by 1000 am via ambulance See other orders RN aware  Will return to [facility] after procedure

## 2013-10-20 NOTE — Progress Notes (Signed)
Discussed with Samuel Schultz and his nurse Wannetta Sender, RN about what to monitor for during ganciclovir IV therapy.  Advised of hazardous handling considerations and contact with patient's body fluids, disposal.  Chemo bin, spill kit brought to patient.  Phaseal adaptors used.  Consent obtained prior to initiation of ganciclovir and dilaudid administration.  PIV site patent and in good condition.  Samuel Schultz verbalized understanding for treatment; written educational materials on ganciclovir and cytomegalovirus provided.  No questions at present.  No s/s of adverse reaction noted. Emotional support also provided to patient.

## 2013-10-20 NOTE — Progress Notes (Signed)
No change in d/c plan at this time. Will return to [facility] when medically stable.  No weekend d/c anticipated per MD.  CSW will continue to monitor and assist with any needs identified.  Patient has multiple medical issues at this time.  Samuel Schultz. [place], [place]

## 2013-10-21 LAB — BASIC METABOLIC PANEL
BUN: 4 mg/dL — AB (ref 6–23)
CHLORIDE: 110 meq/L (ref 96–112)
CO2: 21 meq/L (ref 19–32)
Calcium: 7.7 mg/dL — ABNORMAL LOW (ref 8.4–10.5)
Creatinine, Ser: 0.64 mg/dL (ref 0.50–1.35)
GFR calc Af Amer: >90 mL/min (ref >90)
GFR calc non Af Amer: >90 mL/min (ref >90)
Glucose, Bld: 154 mg/dL — ABNORMAL HIGH (ref 70–99)
Potassium: 3.6 mEq/L — ABNORMAL LOW (ref 3.7–5.3)
Sodium: 140 mEq/L (ref 137–147)

## 2013-10-21 LAB — CBC WITH DIFFERENTIAL/PLATELET
Basophils Absolute: 0 10*3/uL (ref 0.0–0.1)
Basophils Relative: 1 % (ref 0–1)
Eosinophils Absolute: 0.1 10*3/uL (ref 0.0–0.7)
Eosinophils Relative: 3 % (ref 0–5)
HEMATOCRIT: 27.9 % — AB (ref 39.0–52.0)
HEMOGLOBIN: 9.7 g/dL — AB (ref 13.0–17.0)
Lymphocytes Relative: 19 % (ref 12–46)
Lymphs Abs: 0.4 10*3/uL — ABNORMAL LOW (ref 0.7–4.0)
MCH: 35.4 pg — ABNORMAL HIGH (ref 26.0–34.0)
MCHC: 34.8 g/dL (ref 30.0–36.0)
MCV: 101.8 fL — ABNORMAL HIGH (ref 78.0–100.0)
MONOS PCT: 13 % — AB (ref 3–12)
Monocytes Absolute: 0.3 10*3/uL (ref 0.1–1.0)
NEUTROS ABS: 1.4 10*3/uL — AB (ref 1.7–7.7)
Neutrophils Relative %: 65 % (ref 43–77)
Platelets: 31 10*3/uL — ABNORMAL LOW (ref 150–400)
RBC: 2.74 MIL/uL — ABNORMAL LOW (ref 4.22–5.81)
RDW: 17.4 % — ABNORMAL HIGH (ref 11.5–15.5)
WBC: 2.2 10*3/uL — AB (ref 4.0–10.5)

## 2013-10-21 LAB — CULTURE, BLOOD (ROUTINE X 2)
CULTURE: NO GROWTH
Culture: NO GROWTH

## 2013-10-21 LAB — PROTIME-INR
INR: 2.27 — AB (ref 0.00–1.49)
Prothrombin Time: 24.3 seconds — ABNORMAL HIGH (ref 11.6–15.2)

## 2013-10-21 MED ORDER — PHYTONADIONE 5 MG PO TABS
10.0000 mg | ORAL_TABLET | Freq: Once | ORAL | Status: AC
  Administered 2013-10-21: 10 mg via ORAL
  Filled 2013-10-21: qty 2

## 2013-10-21 NOTE — Progress Notes (Signed)
Patient tolerating full liquid diet. No nausea or vomiting. Multiple loose, nonbloody stools overnight. Ganciclovir started yesterday.  No improvement in INR with vitamin K given yesterday (2.27 this morning)  Vital signs in last 24 hours: Temp:  [97.8 F (36.6 C)-98.5 F (36.9 C)] 98.5 F (36.9 C) (05/16 0450) Pulse Rate:  [73-84] 84 (05/16 0450) Resp:  [20] 20 (05/16 0450) BP: (95-116)/(55-68) 95/55 mmHg (05/16 0450) SpO2:  [96 %-99 %] 96 % (05/16 0450) Last BM Date: 10/20/13 General:   Awake. Conversant. Appears relatively comfortable Abdomen. Positive bowel sounds. Very mild diffuse tenderness to palpation. No rebound or guarding.  Extremities:  Without clubbing or edema.    Intake/Output from previous day: 05/15 0701 - 05/16 0700 In: 2540 [P.O.:1040; I.V.:1500] Out: 1600 [Urine:1600] Intake/Output this shift:    Lab Results:  Recent Labs  10/19/13 0516 10/20/13 0517  WBC 3.1* 2.5*  HGB 10.7* 10.2*  HCT 30.7* 29.0*  PLT 46* 37*   BMET  Recent Labs  10/19/13 0516 10/20/13 0517 10/21/13 0634  NA 143 140 140  K 3.9 3.5* 3.6*  CL 113* 111 110  CO2 23 21 21   GLUCOSE 74 78 154*  BUN 3* 3* 4*  CREATININE 0.76 0.65 0.64  CALCIUM 7.9* 7.9* 7.7*   CBC pending  Impression Recommendations:  HIV. Lymphoma. Likely rather advanced cirrhosis with associated marked thrombocytopenia and refractory coagulopathy. CD4 count 20. Major GI issue is that of ongoing nonbloody diarrhea.  Being treated empirically for CMV with ganciclovir. HIV enteropathy as well as unknown drug effect may also be contributing factors. Overall prognosis is poor. Hopefully, diarrhea will improve with ganciclovir therapy.

## 2013-10-21 NOTE — Progress Notes (Signed)
[facility] PROGRESS NOTE  Samuel Schultz WVP:[id number] DOB: May 04, 1976 DOA: 10/16/2013 PCP: No primary provider on file.  Assessment/Plan: 1. Acute colitis, likely infectious. Patient does have a history of AIDS. Stool for C. Difficile negative, cryptosporidium negative, ova and parasites were found to be negative. Continue with Ciprofloxacin and Flagyl day 6. Continue with IV fluids.  GI pathogen negative. CD 4 less than 20. Started on  Ganciclovir day 2 to cover for CMV. Will follow respond to ganciclovir. INR still elevated will repeat dose of vitamin K.   2. AIDS. Continue antiretroviral therapy. He is on Mepron and azithromycin for prophylaxis. Last CD4 count was less than 100. CD 4  Less than 20. HIV viral load pending. Started on Ganciclovir.   3. History lymphoma. Was previously treated with chemotherapy. Reports that he is in remission. 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  4. Cirrhosis. Related to lymphoma . INR elevated at 2.2. Received sub Vitamin K. Will give another dose of vitamin K but oral.   5. Pancytopenia, leukopenia, neutropenia.Appreciate hematologist assistance. WBC increase with Granix. BM biopsy on Monday. Granix daily.  6. Right arm swelling; [place] negative for DVT. Thrombophlebitis.   Code Status: full code Family Communication: no family present Disposition Plan: discharge back to facility once improved   Consultants:  GI  ID phone consult.   Hematologist  Procedures:    Antibiotics:  Ciprofloxacin 5/11>>  Flagyl 5/11>>  HPI/Subjective: Feels he is having more diarrhea. Abdominal pain improved at times.   Objective: Filed Vitals:   10/21/13 1346  BP: 121/69  Pulse: 78  Temp: 98.1 F (36.7 C)  Resp: 20    Intake/Output Summary (Last 24 hours) at 10/21/13 1433 Last data filed at 10/21/13 0600  Gross per 24 hour  Intake   2180 ml  Output   1300 ml  Net    880 ml   Filed Weights   10/16/13 1111 10/16/13 2051   Weight: 95.255 kg (210 lb) 96.208 kg (212 lb 1.6 oz)    Exam:   General:  No acute distress  Cardiovascular: S1, S2, regular rate and rhythm  Respiratory: Clear to auscultation bilaterally  Abdomen: Soft, tender in the lower abdomen, positive bowel sounds  Musculoskeletal: No pedal edema bilaterally   Data Reviewed: Basic Metabolic Panel:  Recent Labs Lab 10/17/13 0514 10/18/13 0508 10/19/13 0516 10/20/13 0517 10/21/13 0634  NA 139 139 143 140 140  K 4.1 3.3* 3.9 3.5* 3.6*  CL 111 112 113* 111 110  CO2 22 20 23 21 21   GLUCOSE 84 148* 74 78 154*  BUN 7 5* 3* 3* 4*  CREATININE 0.82 0.71 0.76 0.65 0.64  CALCIUM 7.9* 7.5* 7.9* 7.9* 7.7*   Liver Function Tests:  Recent Labs Lab 10/16/13 1149  AST 45*  ALT 23  ALKPHOS 92  BILITOT 3.3*  PROT 6.5  ALBUMIN 2.6*    Recent Labs Lab 10/16/13 1149  LIPASE 133*   No results found for this basename: AMMONIA,  in the last 168 hours CBC:  Recent Labs Lab 10/16/13 1149 10/17/13 0514 10/18/13 0508 10/19/13 0516 10/20/13 0517 10/21/13 0634  WBC 3.5* 1.3* 0.8* 3.1* 2.5* 2.2*  NEUTROABS 2.2  --  0.3*  --  1.6* 1.4*  HGB 13.0 11.0* 10.3* 10.7* 10.2* 9.7*  HCT 35.7* 31.1* 28.8* 30.7* 29.0* 27.9*  MCV 99.4 101.6* 101.8* 101.7* 101.0* 101.8*  PLT 58* 42* 33* 46* 37* 31*   Cardiac Enzymes: No results found for this basename: CKTOTAL,  CKMB, CKMBINDEX, TROPONINI,  in the last 168 hours BNP (last 3 results) No results found for this basename: PROBNP,  in the last 8760 hours CBG: No results found for this basename: GLUCAP,  in the last 168 hours  Recent Results (from the past 240 hour(s))  CLOSTRIDIUM DIFFICILE BY PCR     Status: None   Collection Time    10/16/13  3:48 PM      Result Value Ref Range Status   C difficile by pcr NEGATIVE  NEGATIVE Final  STOOL CULTURE     Status: None   Collection Time    10/16/13  3:48 PM      Result Value Ref Range Status   Specimen Description STOOL   Final   Special  Requests IMMUNE:COMPROMISED   Final   Culture     Final   Value: NO SALMONELLA, SHIGELLA, CAMPYLOBACTER, YERSINIA, OR E.COLI 0157:H7 ISOLATED     Performed at [facility]   Report Status 10/20/2013 FINAL   Final  OVA AND PARASITE EXAMINATION     Status: None   Collection Time    10/16/13  3:48 PM      Result Value Ref Range Status   Specimen Description STOOL   Final   Special Requests IMMUNE:COMPROMISED   Final   Ova and parasites     Final   Value: NO OVA OR PARASITES SEEN     Performed at [facility]   Report Status 10/17/2013 FINAL   Final  CRYPTOSPORIDIUM SMEAR, FECAL     Status: None   Collection Time    10/16/13  3:48 PM      Result Value Ref Range Status   Specimen Description STOOL   Final   Special Requests NONE   Final   Cryptosporidium Smear.     Final   Value: NO Cryptosporidium Cyclospora or Isospora seen.     Performed at [facility]   Report Status 10/17/2013 FINAL   Final  CULTURE, BLOOD (ROUTINE X 2)     Status: None   Collection Time    10/16/13  6:06 PM      Result Value Ref Range Status   Specimen Description BLOOD RIGHT ANTECUBITAL   Final   Special Requests     Final   Value: BOTTLES DRAWN AEROBIC AND ANAEROBIC 8CC IMMUNE:COMPROMISED   Culture NO GROWTH 5 DAYS   Final   Report Status 10/21/2013 FINAL   Final  CULTURE, BLOOD (ROUTINE X 2)     Status: None   Collection Time    10/16/13  6:11 PM      Result Value Ref Range Status   Specimen Description BLOOD LEFT ANTECUBITAL   Final   Special Requests     Final   Value: BOTTLES DRAWN AEROBIC AND ANAEROBIC 8CC IMMUNE:COMPROMISED   Culture NO GROWTH 5 DAYS   Final   Report Status 10/21/2013 FINAL   Final     Studies: US Venous Img Upper Uni Right  10/20/2013   CLINICAL DATA:  Right arm swelling  EXAM: RIGHT UPPER EXTREMITY VENOUS DOPPLER ULTRASOUND  TECHNIQUE: Gray-scale sonography with graded compression, as well as color Doppler and duplex ultrasound were performed to  evaluate the upper extremity deep venous system from the level of the subclavian vein and including the jugular, axillary, basilic and upper cephalic vein. Spectral Doppler was utilized to evaluate flow at rest and with distal augmentation maneuvers.  COMPARISON:  None.  FINDINGS: Thrombus within deep veins:  None visualized.  Compressibility of deep veins:  Normal.  Duplex waveform respiratory phasicity:  Normal.  Duplex waveform response to augmentation:  Normal.  Venous reflux:  None visualized.  Other findings: There is nonocclusive, incompletely compressible mural thrombus in the cephalic vein at the level of the elbow with patent persistent flow through this segment. The more central aspect of the cephalic vein is widely patent and compressible. There is subcutaneous edema in the wrist and upper arm.  IMPRESSION: 1. Negative for right upper extremity DVT. 2. Segmental superficial thrombophlebitis involving cephalic vein.   Electronically Signed   By: Arne Cleveland M.D.   On: 10/20/2013 11:45    Scheduled Meds: . atovaquone  1,500 mg Oral Daily  . azithromycin  1,200 mg Oral Q7 days  . ciprofloxacin  400 mg Intravenous Q12H  . Darunavir Ethanolate  800 mg Oral Q breakfast  . emtricitabine-tenofovir  1 tablet Oral QHS  . escitalopram  20 mg Oral Daily  . feeding supplement (RESOURCE BREEZE)  1 Container Oral TID BM  . fluconazole (DIFLUCAN) IV  400 mg Intravenous Q24H  . gabapentin  300 mg Oral TID  . ganciclovir (CYTOVENE) IV  5 mg/kg Intravenous Q12H  . metronidazole  500 mg Intravenous Q8H  . pantoprazole  40 mg Oral BID  . phytonadione  10 mg Oral Once  . prazosin  1 mg Oral QHS  . ritonavir  100 mg Oral Q breakfast  . sodium chloride  3 mL Intravenous Q12H  . sucralfate  1 g Oral TID WC & HS  . Tbo-Filgrastim  480 mcg Subcutaneous q1800   Continuous Infusions: . sodium chloride 125 mL/hr at 10/21/13 1122    Principal Problem:   Colitis, acute Active Problems:   Abdominal  pain   AIDS   Large cell lymphoma   Cirrhosis, non-alcoholic   Pancytopenia   Diarrhea   Nausea and vomiting in adult   Hypokalemia   Acute colitis    Time spent: 38mins    Samuel Schultz A Samuel Schultz  [facility] Pager [phone]. If 7PM-7AM, please contact night-coverage at [email or web address], password [facility] 10/21/2013, 2:33 PM  LOS: 5 days

## 2013-10-22 ENCOUNTER — Inpatient Hospital Stay (HOSPITAL_COMMUNITY): Payer: Medicaid Other

## 2013-10-22 LAB — CBC WITH DIFFERENTIAL/PLATELET
Basophils Absolute: 0 10*3/uL (ref 0.0–0.1)
Basophils Relative: 1 % (ref 0–1)
Eosinophils Absolute: 0.1 10*3/uL (ref 0.0–0.7)
Eosinophils Relative: 4 % (ref 0–5)
HEMATOCRIT: 29.6 % — AB (ref 39.0–52.0)
Hemoglobin: 10.3 g/dL — ABNORMAL LOW (ref 13.0–17.0)
LYMPHS ABS: 0.6 10*3/uL — AB (ref 0.7–4.0)
Lymphocytes Relative: 19 % (ref 12–46)
MCH: 35.6 pg — ABNORMAL HIGH (ref 26.0–34.0)
MCHC: 34.8 g/dL (ref 30.0–36.0)
MCV: 102.4 fL — AB (ref 78.0–100.0)
MONOS PCT: 13 % — AB (ref 3–12)
Monocytes Absolute: 0.4 10*3/uL (ref 0.1–1.0)
NEUTROS ABS: 2.1 10*3/uL (ref 1.7–7.7)
Neutrophils Relative %: 63 % (ref 43–77)
Platelets: 35 10*3/uL — ABNORMAL LOW (ref 150–400)
RBC: 2.89 MIL/uL — ABNORMAL LOW (ref 4.22–5.81)
RDW: 17.6 % — ABNORMAL HIGH (ref 11.5–15.5)
WBC: 3.2 10*3/uL — ABNORMAL LOW (ref 4.0–10.5)

## 2013-10-22 LAB — LACTIC ACID, PLASMA: Lactic Acid, Venous: 3.1 mmol/L — ABNORMAL HIGH (ref 0.5–2.2)

## 2013-10-22 LAB — BASIC METABOLIC PANEL
BUN: 3 mg/dL — ABNORMAL LOW (ref 6–23)
CALCIUM: 7.9 mg/dL — AB (ref 8.4–10.5)
CO2: 23 meq/L (ref 19–32)
CREATININE: 0.73 mg/dL (ref 0.50–1.35)
Chloride: 108 mEq/L (ref 96–112)
GFR calc Af Amer: >90 mL/min (ref >90)
GFR calc non Af Amer: >90 mL/min (ref >90)
Glucose, Bld: 74 mg/dL (ref 70–99)
Potassium: 3.7 mEq/L (ref 3.7–5.3)
Sodium: 139 mEq/L (ref 137–147)

## 2013-10-22 LAB — HEPATIC FUNCTION PANEL
ALBUMIN: 2.4 g/dL — AB (ref 3.5–5.2)
ALK PHOS: 100 U/L (ref 39–117)
ALT: 21 U/L (ref 0–53)
AST: 45 U/L — AB (ref 0–37)
BILIRUBIN TOTAL: 1.7 mg/dL — AB (ref 0.3–1.2)
Bilirubin, Direct: 0.5 mg/dL — ABNORMAL HIGH (ref 0.0–0.3)
Indirect Bilirubin: 1.2 mg/dL — ABNORMAL HIGH (ref 0.3–0.9)
Total Protein: 6 g/dL (ref 6.0–8.3)

## 2013-10-22 LAB — PROTIME-INR
INR: 2.31 — ABNORMAL HIGH (ref 0.00–1.49)
Prothrombin Time: 24.6 seconds — ABNORMAL HIGH (ref 11.6–15.2)

## 2013-10-22 MED ORDER — HYDROMORPHONE HCL PF 1 MG/ML IJ SOLN
1.0000 mg | INTRAMUSCULAR | Status: DC | PRN
  Administered 2013-10-22 (×2): 1 mg via INTRAVENOUS
  Filled 2013-10-22 (×2): qty 1

## 2013-10-22 MED ORDER — IOHEXOL 300 MG/ML  SOLN
100.0000 mL | Freq: Once | INTRAMUSCULAR | Status: AC | PRN
  Administered 2013-10-22: 100 mL via INTRAVENOUS

## 2013-10-22 NOTE — Progress Notes (Signed)
Dr. Tyrell Antonio calle about pt having much more abdominal today. Abdomen firm. Incontinent non-bloody stool x 1   Vital signs in last 24 hours: Temp:  [97.6 F (36.4 C)-98.1 F (36.7 C)] 97.6 F (36.4 C) (05/17 0457) Pulse Rate:  [77-79] 77 (05/17 0457) Resp:  [20] 20 (05/17 0457) BP: (96-121)/(60-74) 96/60 mmHg (05/17 0457) SpO2:  [96 %-98 %] 96 % (05/17 0457) Last BM Date: 10/21/13 General:   Alert,  and cooperative; conversant.  Appears uncomfortable  Abdomen:  Slightly more distended than yesterday. The abdomen is quiet.  Mild to moderately diffusely tender with equivocal rebound. No mass. He's not guarding. Extremities:  Without clubbing or edema.    Intake/Output from previous day: 05/16 0701 - 05/17 0700 In: 5940 [P.O.:240; I.V.:3000; IV Piggyback:2700] Out: 300 [Urine:300] Intake/Output this shift: Total I/O In: -  Out: 800 [Urine:800]  Lab Results:  Recent Labs  10/20/13 0517 10/21/13 0634 10/22/13 0615  WBC 2.5* 2.2* 3.2*  HGB 10.2* 9.7* 10.3*  HCT 29.0* 27.9* 29.6*  PLT 37* 31* 35*   Lactic acid elevated at 3.1  BMET  Recent Labs  10/20/13 0517 10/21/13 0634 10/22/13 0615  NA 140 140 139  K 3.5* 3.6* 3.7  CL 111 110 108  CO2 21 21 23   GLUCOSE 78 154* 74  BUN 3* 4* 3*  CREATININE 0.65 0.64 0.73  CALCIUM 7.9* 7.7* 7.9*     Impression:   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 Chronic diarrhea on ganciclovir. Worsening of abdominal pain today. He appears to be more distended abdomen it is more diffusely tender than yesterday. Lactic acid elevated.   Recommendations: As discussed with the hospitalist, recommend a stat contrast CT of abdomen and pelvis. Unfortunately, he is not a surgical candidate. In fact, he is not an endoscopy candidate at this time with this marked coagulopathy and thrombocytopenia. Will review CT as it becomes available. Might consider transferring this gentleman to a tertiary referral center where more  resources are available.

## 2013-10-22 NOTE — Progress Notes (Signed)
[facility] PROGRESS NOTE  Samuel Schultz ACZ:[id number] DOB: 12-14-1975 DOA: 10/16/2013 PCP: No primary provider on file.  Assessment/Plan: 1-Acute colitis, likely infectious. Patient does have a history of AIDS. Stool for C. Difficile negative, cryptosporidium negative, ova and parasites were found to be negative.  Continue with Ciprofloxacin and Flagyl day 7. Continue with IV fluids.  GI pathogen negative. CD 4 less than 20. Started on  Ganciclovir day 3 to cover for CMV.  INR still elevated after oral vitamin K.  No significant improvement with ganciclovir.  Abdomen more distended. Will Check KUB, lactic acid. Will inform GI.  Will discussed case with ID again.   2-AIDS. Continue antiretroviral therapy. He is on Mepron and azithromycin for prophylaxis. Last CD4 count was less than 100. CD 4  Less than 20. HIV viral load pending. Started on Ganciclovir.   3-History lymphoma. Was previously treated with chemotherapy. Reports that he is in remission. 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  4-Cirrhosis. Related to lymphoma . INR elevated at 2.2. Received sub Vitamin K. Will give another dose of vitamin K but oral.   5-Pancytopenia, leukopenia, neutropenia.Appreciate hematologist assistance. WBC increase with Granix. BM biopsy on Monday. Granix daily.  6-Right arm swelling; [place] negative for DVT. Thrombophlebitis.   Code Status: full code Family Communication: no family present Disposition Plan: discharge back to facility once improved   Consultants:  GI  ID phone consult.   Hematologist  Procedures:  none  Antibiotics:  Ciprofloxacin 5/11>>  Flagyl 5/11>>  HPI/Subjective: He had 6 to 9 BM yesterday, so far 4 today. He feels abdomen more distended.  Still abdominal pain, medication doesn't last long.   Objective: Filed Vitals:   10/22/13 0457  BP: 96/60  Pulse: 77  Temp: 97.6 F (36.4 C)  Resp: 20    Intake/Output Summary (Last 24 hours) at  10/22/13 1046 Last data filed at 10/22/13 1000  Gross per 24 hour  Intake   5940 ml  Output   1100 ml  Net   4840 ml   Filed Weights   10/16/13 1111 10/16/13 2051  Weight: 95.255 kg (210 lb) 96.208 kg (212 lb 1.6 oz)    Exam:   General:  No acute distress  Cardiovascular: S1, S2, regular rate and rhythm  Respiratory: Clear to auscultation bilaterally  Abdomen:; generalized tenderness, distended.  positive bowel sound  Musculoskeletal: No pedal edema bilaterally   Data Reviewed: Basic Metabolic Panel:  Recent Labs Lab 10/18/13 0508 10/19/13 0516 10/20/13 0517 10/21/13 0634 10/22/13 0615  NA 139 143 140 140 139  K 3.3* 3.9 3.5* 3.6* 3.7  CL 112 113* 111 110 108  CO2 20 23 21 21 23   GLUCOSE 148* 74 78 154* 74  BUN 5* 3* 3* 4* 3*  CREATININE 0.71 0.76 0.65 0.64 0.73  CALCIUM 7.5* 7.9* 7.9* 7.7* 7.9*   Liver Function Tests:  Recent Labs Lab 10/16/13 1149  AST 45*  ALT 23  ALKPHOS 92  BILITOT 3.3*  PROT 6.5  ALBUMIN 2.6*    Recent Labs Lab 10/16/13 1149  LIPASE 133*   No results found for this basename: AMMONIA,  in the last 168 hours CBC:  Recent Labs Lab 10/16/13 1149  10/18/13 0508 10/19/13 0516 10/20/13 0517 10/21/13 0634 10/22/13 0615  WBC 3.5*  < > 0.8* 3.1* 2.5* 2.2* 3.2*  NEUTROABS 2.2  --  0.3*  --  1.6* 1.4* 2.1  HGB 13.0  < > 10.3* 10.7* 10.2* 9.7* 10.3*  HCT 35.7*  < >  28.8* 30.7* 29.0* 27.9* 29.6*  MCV 99.4  < > 101.8* 101.7* 101.0* 101.8* 102.4*  PLT 58*  < > 33* 46* 37* 31* 35*  < > = values in this interval not displayed. Cardiac Enzymes: No results found for this basename: CKTOTAL, CKMB, CKMBINDEX, TROPONINI,  in the last 168 hours BNP (last 3 results) No results found for this basename: PROBNP,  in the last 8760 hours CBG: No results found for this basename: GLUCAP,  in the last 168 hours  Recent Results (from the past 240 hour(s))  CLOSTRIDIUM DIFFICILE BY PCR     Status: None   Collection Time    10/16/13  3:48 PM       Result Value Ref Range Status   C difficile by pcr NEGATIVE  NEGATIVE Final  STOOL CULTURE     Status: None   Collection Time    10/16/13  3:48 PM      Result Value Ref Range Status   Specimen Description STOOL   Final   Special Requests IMMUNE:COMPROMISED   Final   Culture     Final   Value: NO SALMONELLA, SHIGELLA, CAMPYLOBACTER, YERSINIA, OR E.COLI 0157:H7 ISOLATED     Performed at [facility]   Report Status 10/20/2013 FINAL   Final  OVA AND PARASITE EXAMINATION     Status: None   Collection Time    10/16/13  3:48 PM      Result Value Ref Range Status   Specimen Description STOOL   Final   Special Requests IMMUNE:COMPROMISED   Final   Ova and parasites     Final   Value: NO OVA OR PARASITES SEEN     Performed at [facility]   Report Status 10/17/2013 FINAL   Final  CRYPTOSPORIDIUM SMEAR, FECAL     Status: None   Collection Time    10/16/13  3:48 PM      Result Value Ref Range Status   Specimen Description STOOL   Final   Special Requests NONE   Final   Cryptosporidium Smear.     Final   Value: NO Cryptosporidium Cyclospora or Isospora seen.     Performed at [facility]   Report Status 10/17/2013 FINAL   Final  CULTURE, BLOOD (ROUTINE X 2)     Status: None   Collection Time    10/16/13  6:06 PM      Result Value Ref Range Status   Specimen Description BLOOD RIGHT ANTECUBITAL   Final   Special Requests     Final   Value: BOTTLES DRAWN AEROBIC AND ANAEROBIC 8CC IMMUNE:COMPROMISED   Culture NO GROWTH 5 DAYS   Final   Report Status 10/21/2013 FINAL   Final  CULTURE, BLOOD (ROUTINE X 2)     Status: None   Collection Time    10/16/13  6:11 PM      Result Value Ref Range Status   Specimen Description BLOOD LEFT ANTECUBITAL   Final   Special Requests     Final   Value: BOTTLES DRAWN AEROBIC AND ANAEROBIC 8CC IMMUNE:COMPROMISED   Culture NO GROWTH 5 DAYS   Final   Report Status 10/21/2013 FINAL   Final     Studies: US Venous Img Upper  Uni Right  10/20/2013   CLINICAL DATA:  Right arm swelling  EXAM: RIGHT UPPER EXTREMITY VENOUS DOPPLER ULTRASOUND  TECHNIQUE: Gray-scale sonography with graded compression, as well as color Doppler and duplex ultrasound were performed to evaluate the  upper extremity deep venous system from the level of the subclavian vein and including the jugular, axillary, basilic and upper cephalic vein. Spectral Doppler was utilized to evaluate flow at rest and with distal augmentation maneuvers.  COMPARISON:  None.  FINDINGS: Thrombus within deep veins:  None visualized.  Compressibility of deep veins:  Normal.  Duplex waveform respiratory phasicity:  Normal.  Duplex waveform response to augmentation:  Normal.  Venous reflux:  None visualized.  Other findings: There is nonocclusive, incompletely compressible mural thrombus in the cephalic vein at the level of the elbow with patent persistent flow through this segment. The more central aspect of the cephalic vein is widely patent and compressible. There is subcutaneous edema in the wrist and upper arm.  IMPRESSION: 1. Negative for right upper extremity DVT. 2. Segmental superficial thrombophlebitis involving cephalic vein.   Electronically Signed   By: Arne Cleveland M.D.   On: 10/20/2013 11:45    Scheduled Meds: . atovaquone  1,500 mg Oral Daily  . azithromycin  1,200 mg Oral Q7 days  . ciprofloxacin  400 mg Intravenous Q12H  . Darunavir Ethanolate  800 mg Oral Q breakfast  . emtricitabine-tenofovir  1 tablet Oral QHS  . escitalopram  20 mg Oral Daily  . feeding supplement (RESOURCE BREEZE)  1 Container Oral TID BM  . fluconazole (DIFLUCAN) IV  400 mg Intravenous Q24H  . gabapentin  300 mg Oral TID  . ganciclovir (CYTOVENE) IV  5 mg/kg Intravenous Q12H  . metronidazole  500 mg Intravenous Q8H  . pantoprazole  40 mg Oral BID  . prazosin  1 mg Oral QHS  . ritonavir  100 mg Oral Q breakfast  . sodium chloride  3 mL Intravenous Q12H  . sucralfate  1 g Oral TID  WC & HS  . Tbo-Filgrastim  480 mcg Subcutaneous q1800   Continuous Infusions: . sodium chloride 125 mL/hr at 10/22/13 0543    Principal Problem:   Colitis, acute Active Problems:   Abdominal pain   AIDS   Large cell lymphoma   Cirrhosis, non-alcoholic   Pancytopenia   Diarrhea   Nausea and vomiting in adult   Hypokalemia   Acute colitis    Time spent: 36mins    Belkys A Regalado  [facility] Pager [phone]. If 7PM-7AM, please contact night-coverage at [email or web address], password [facility] 10/22/2013, 10:46 AM  LOS: 6 days
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
## 2013-10-23 ENCOUNTER — Ambulatory Visit (HOSPITAL_COMMUNITY): Payer: Medicaid Other

## 2013-10-23 NOTE — Progress Notes (Signed)
UR chart review completed.  

## 2013-10-24 LAB — HIV-1 RNA, QUALITATIVE, TMA: HIV-1 RNA, Qualitative, TMA: DETECTED — AB

## 2013-10-25 LAB — MICROSPORIDIA SPORE STAIN, FECES

## 2013-11-06 ENCOUNTER — Emergency Department (HOSPITAL_COMMUNITY): Payer: Medicaid Other

## 2013-11-06 ENCOUNTER — Encounter (HOSPITAL_COMMUNITY): Payer: Self-pay | Admitting: Emergency Medicine

## 2013-11-06 ENCOUNTER — Inpatient Hospital Stay (HOSPITAL_COMMUNITY)
Admission: EM | Admit: 2013-11-06 | Discharge: 2013-11-25 | DRG: 977 | Disposition: A | Discharge status: Skilled Nursing Facility | Payer: Medicaid Other | Attending: Internal Medicine | Admitting: Internal Medicine

## 2013-11-06 DIAGNOSIS — D61818 Other pancytopenia: Secondary | ICD-10-CM

## 2013-11-06 DIAGNOSIS — C8580 Other specified types of non-Hodgkin lymphoma, unspecified site: Secondary | ICD-10-CM

## 2013-11-06 DIAGNOSIS — K219 Gastro-esophageal reflux disease without esophagitis: Secondary | ICD-10-CM | POA: Diagnosis present

## 2013-11-06 DIAGNOSIS — B370 Candidal stomatitis: Secondary | ICD-10-CM | POA: Diagnosis present

## 2013-11-06 DIAGNOSIS — B20 Human immunodeficiency virus [HIV] disease: Secondary | ICD-10-CM

## 2013-11-06 DIAGNOSIS — Z9221 Personal history of antineoplastic chemotherapy: Secondary | ICD-10-CM

## 2013-11-06 DIAGNOSIS — Z87898 Personal history of other specified conditions: Secondary | ICD-10-CM

## 2013-11-06 DIAGNOSIS — R109 Unspecified abdominal pain: Secondary | ICD-10-CM

## 2013-11-06 DIAGNOSIS — R197 Diarrhea, unspecified: Secondary | ICD-10-CM | POA: Diagnosis present

## 2013-11-06 DIAGNOSIS — F3289 Other specified depressive episodes: Secondary | ICD-10-CM | POA: Diagnosis present

## 2013-11-06 DIAGNOSIS — K7460 Unspecified cirrhosis of liver: Secondary | ICD-10-CM

## 2013-11-06 DIAGNOSIS — B955 Unspecified streptococcus as the cause of diseases classified elsewhere: Secondary | ICD-10-CM

## 2013-11-06 DIAGNOSIS — E43 Unspecified severe protein-calorie malnutrition: Secondary | ICD-10-CM

## 2013-11-06 DIAGNOSIS — F4310 Post-traumatic stress disorder, unspecified: Secondary | ICD-10-CM

## 2013-11-06 DIAGNOSIS — A419 Sepsis, unspecified organism: Secondary | ICD-10-CM

## 2013-11-06 DIAGNOSIS — F329 Major depressive disorder, single episode, unspecified: Secondary | ICD-10-CM | POA: Diagnosis present

## 2013-11-06 DIAGNOSIS — E669 Obesity, unspecified: Secondary | ICD-10-CM | POA: Diagnosis present

## 2013-11-06 DIAGNOSIS — Z1621 Resistance to vancomycin: Secondary | ICD-10-CM

## 2013-11-06 DIAGNOSIS — I4729 Other ventricular tachycardia: Secondary | ICD-10-CM

## 2013-11-06 DIAGNOSIS — B952 Enterococcus as the cause of diseases classified elsewhere: Secondary | ICD-10-CM

## 2013-11-06 DIAGNOSIS — D689 Coagulation defect, unspecified: Secondary | ICD-10-CM | POA: Diagnosis not present

## 2013-11-06 DIAGNOSIS — R112 Nausea with vomiting, unspecified: Secondary | ICD-10-CM | POA: Diagnosis present

## 2013-11-06 DIAGNOSIS — R5381 Other malaise: Secondary | ICD-10-CM | POA: Diagnosis present

## 2013-11-06 DIAGNOSIS — I10 Essential (primary) hypertension: Secondary | ICD-10-CM | POA: Diagnosis present

## 2013-11-06 DIAGNOSIS — I8510 Secondary esophageal varices without bleeding: Secondary | ICD-10-CM | POA: Diagnosis present

## 2013-11-06 DIAGNOSIS — I959 Hypotension, unspecified: Secondary | ICD-10-CM | POA: Diagnosis not present

## 2013-11-06 DIAGNOSIS — A491 Streptococcal infection, unspecified site: Secondary | ICD-10-CM

## 2013-11-06 DIAGNOSIS — D649 Anemia, unspecified: Secondary | ICD-10-CM | POA: Diagnosis present

## 2013-11-06 DIAGNOSIS — B9689 Other specified bacterial agents as the cause of diseases classified elsewhere: Secondary | ICD-10-CM | POA: Diagnosis not present

## 2013-11-06 DIAGNOSIS — R5383 Other fatigue: Secondary | ICD-10-CM

## 2013-11-06 DIAGNOSIS — I472 Ventricular tachycardia, unspecified: Secondary | ICD-10-CM | POA: Diagnosis not present

## 2013-11-06 DIAGNOSIS — A09 Infectious gastroenteritis and colitis, unspecified: Principal | ICD-10-CM | POA: Diagnosis present

## 2013-11-06 DIAGNOSIS — N179 Acute kidney failure, unspecified: Secondary | ICD-10-CM

## 2013-11-06 DIAGNOSIS — B49 Unspecified mycosis: Secondary | ICD-10-CM

## 2013-11-06 DIAGNOSIS — O3480 Maternal care for other abnormalities of pelvic organs, unspecified trimester: Secondary | ICD-10-CM

## 2013-11-06 DIAGNOSIS — R609 Edema, unspecified: Secondary | ICD-10-CM | POA: Diagnosis not present

## 2013-11-06 DIAGNOSIS — E162 Hypoglycemia, unspecified: Secondary | ICD-10-CM | POA: Diagnosis not present

## 2013-11-06 DIAGNOSIS — Y849 Medical procedure, unspecified as the cause of abnormal reaction of the patient, or of later complication, without mention of misadventure at the time of the procedure: Secondary | ICD-10-CM

## 2013-11-06 DIAGNOSIS — F32A Depression, unspecified: Secondary | ICD-10-CM

## 2013-11-06 DIAGNOSIS — K529 Noninfective gastroenteritis and colitis, unspecified: Secondary | ICD-10-CM | POA: Diagnosis present

## 2013-11-06 DIAGNOSIS — R7881 Bacteremia: Secondary | ICD-10-CM | POA: Diagnosis not present

## 2013-11-06 DIAGNOSIS — T827XXA Infection and inflammatory reaction due to other cardiac and vascular devices, implants and grafts, initial encounter: Secondary | ICD-10-CM

## 2013-11-06 DIAGNOSIS — E860 Dehydration: Secondary | ICD-10-CM | POA: Diagnosis not present

## 2013-11-06 DIAGNOSIS — K5289 Other specified noninfective gastroenteritis and colitis: Secondary | ICD-10-CM

## 2013-11-06 HISTORY — DX: Major depressive disorder, single episode, unspecified: F32.9

## 2013-11-06 HISTORY — DX: Post-traumatic stress disorder, unspecified: F43.10

## 2013-11-06 HISTORY — DX: Unspecified cirrhosis of liver: K74.60

## 2013-11-06 HISTORY — DX: Human immunodeficiency virus (HIV) disease: B20

## 2013-11-06 HISTORY — DX: Depression, unspecified: F32.A

## 2013-11-06 LAB — LACTIC ACID, PLASMA: Lactic Acid, Venous: 1.4 mmol/L (ref 0.5–2.2)

## 2013-11-06 LAB — URINALYSIS, ROUTINE W REFLEX MICROSCOPIC
GLUCOSE, UA: NEGATIVE mg/dL
KETONES UR: NEGATIVE mg/dL
Leukocytes, UA: NEGATIVE
NITRITE: NEGATIVE
PH: 6.5 (ref 5.0–8.0)
Specific Gravity, Urine: 1.02 (ref 1.005–1.030)
Urobilinogen, UA: 0.2 mg/dL (ref 0.0–1.0)

## 2013-11-06 LAB — URINE MICROSCOPIC-ADD ON

## 2013-11-06 LAB — CBC WITH DIFFERENTIAL/PLATELET
BASOS ABS: 0 10*3/uL (ref 0.0–0.1)
BASOS PCT: 1 % (ref 0–1)
EOS ABS: 0.1 10*3/uL (ref 0.0–0.7)
Eosinophils Relative: 3 % (ref 0–5)
HCT: 34.7 % — ABNORMAL LOW (ref 39.0–52.0)
HEMOGLOBIN: 12.3 g/dL — AB (ref 13.0–17.0)
Lymphocytes Relative: 35 % (ref 12–46)
Lymphs Abs: 0.8 10*3/uL (ref 0.7–4.0)
MCH: 36.2 pg — ABNORMAL HIGH (ref 26.0–34.0)
MCHC: 35.4 g/dL (ref 30.0–36.0)
MCV: 102.1 fL — ABNORMAL HIGH (ref 78.0–100.0)
Monocytes Absolute: 0.2 10*3/uL (ref 0.1–1.0)
Monocytes Relative: 10 % (ref 3–12)
NEUTROS ABS: 1.2 10*3/uL — AB (ref 1.7–7.7)
NEUTROS PCT: 51 % (ref 43–77)
Platelets: 68 10*3/uL — ABNORMAL LOW (ref 150–400)
RBC: 3.4 MIL/uL — ABNORMAL LOW (ref 4.22–5.81)
RDW: 16.8 % — ABNORMAL HIGH (ref 11.5–15.5)
Smear Review: DECREASED
WBC: 2.3 10*3/uL — ABNORMAL LOW (ref 4.0–10.5)

## 2013-11-06 LAB — COMPREHENSIVE METABOLIC PANEL
ALBUMIN: 2.7 g/dL — AB (ref 3.5–5.2)
ALK PHOS: 115 U/L (ref 39–117)
ALT: 22 U/L (ref 0–53)
AST: 46 U/L — ABNORMAL HIGH (ref 0–37)
BUN: 5 mg/dL — ABNORMAL LOW (ref 6–23)
CALCIUM: 8.6 mg/dL (ref 8.4–10.5)
CO2: 20 mEq/L (ref 19–32)
Chloride: 108 mEq/L (ref 96–112)
Creatinine, Ser: 0.58 mg/dL (ref 0.50–1.35)
GFR calc non Af Amer: >90 mL/min (ref >90)
Glucose, Bld: 85 mg/dL (ref 70–99)
POTASSIUM: 3.7 meq/L (ref 3.7–5.3)
SODIUM: 139 meq/L (ref 137–147)
Total Bilirubin: 3.4 mg/dL — ABNORMAL HIGH (ref 0.3–1.2)
Total Protein: 7.2 g/dL (ref 6.0–8.3)

## 2013-11-06 LAB — MAGNESIUM: Magnesium: 1.7 mg/dL (ref 1.5–2.5)

## 2013-11-06 LAB — LIPASE, BLOOD: LIPASE: 113 U/L — AB (ref 11–59)

## 2013-11-06 MED ORDER — ATOVAQUONE 750 MG/5ML PO SUSP
1500.0000 mg | Freq: Every day | ORAL | Status: DC
  Administered 2013-11-07 – 2013-11-18 (×9): 1500 mg via ORAL
  Filled 2013-11-06 (×23): qty 10

## 2013-11-06 MED ORDER — DARUNAVIR ETHANOLATE 800 MG PO TABS
800.0000 mg | ORAL_TABLET | Freq: Every day | ORAL | Status: DC
  Administered 2013-11-06 – 2013-11-09 (×4): 800 mg via ORAL
  Filled 2013-11-06 (×6): qty 1

## 2013-11-06 MED ORDER — ONDANSETRON HCL 4 MG/2ML IJ SOLN
4.0000 mg | Freq: Once | INTRAMUSCULAR | Status: AC
  Administered 2013-11-06: 4 mg via INTRAVENOUS
  Filled 2013-11-06: qty 2

## 2013-11-06 MED ORDER — ACETAMINOPHEN 650 MG RE SUPP: 650.0000 mg | Freq: Four times a day (QID) | RECTAL | Status: DC | PRN

## 2013-11-06 MED ORDER — EMTRICITABINE-TENOFOVIR DF 200-300 MG PO TABS
1.0000 | ORAL_TABLET | Freq: Every day | ORAL | Status: DC
  Administered 2013-11-06 – 2013-11-24 (×19): 1 via ORAL
  Filled 2013-11-06 (×22): qty 1

## 2013-11-06 MED ORDER — SUCRALFATE 1 G PO TABS
1.0000 g | ORAL_TABLET | Freq: Three times a day (TID) | ORAL | Status: DC
  Administered 2013-11-07 – 2013-11-25 (×51): 1 g via ORAL
  Filled 2013-11-06 (×65): qty 1

## 2013-11-06 MED ORDER — PROMETHAZINE HCL 25 MG/ML IJ SOLN
12.5000 mg | Freq: Once | INTRAMUSCULAR | Status: AC
  Administered 2013-11-06: 12.5 mg via INTRAVENOUS
  Filled 2013-11-06: qty 1

## 2013-11-06 MED ORDER — FENTANYL CITRATE 0.05 MG/ML IJ SOLN
100.0000 ug | Freq: Once | INTRAMUSCULAR | Status: AC
  Administered 2013-11-06: 100 ug via INTRAVENOUS
  Filled 2013-11-06: qty 2

## 2013-11-06 MED ORDER — SODIUM CHLORIDE 0.9 % IV BOLUS (SEPSIS)
1000.0000 mL | Freq: Once | INTRAVENOUS | Status: AC
Start: 2013-11-06 — End: 2013-11-06
  Administered 2013-11-06: 1000 mL via INTRAVENOUS

## 2013-11-06 MED ORDER — METRONIDAZOLE IN NACL 5-0.79 MG/ML-% IV SOLN
500.0000 mg | Freq: Three times a day (TID) | INTRAVENOUS | Status: DC
  Administered 2013-11-06 – 2013-11-10 (×12): 500 mg via INTRAVENOUS
  Filled 2013-11-06 (×14): qty 100

## 2013-11-06 MED ORDER — GABAPENTIN 300 MG PO CAPS
300.0000 mg | ORAL_CAPSULE | Freq: Three times a day (TID) | ORAL | Status: DC
  Administered 2013-11-06 – 2013-11-10 (×12): 300 mg via ORAL
  Filled 2013-11-06 (×15): qty 1

## 2013-11-06 MED ORDER — ACETAMINOPHEN 325 MG PO TABS
650.0000 mg | ORAL_TABLET | Freq: Four times a day (QID) | ORAL | Status: DC | PRN
  Administered 2013-11-10 – 2013-11-24 (×4): 650 mg via ORAL
  Filled 2013-11-06 (×5): qty 2

## 2013-11-06 MED ORDER — ESCITALOPRAM OXALATE 10 MG PO TABS
20.0000 mg | ORAL_TABLET | Freq: Every day | ORAL | Status: DC
  Administered 2013-11-06 – 2013-11-11 (×6): 20 mg via ORAL
  Filled 2013-11-06 (×6): qty 2

## 2013-11-06 MED ORDER — MORPHINE SULFATE ER 15 MG PO TBCR
15.0000 mg | EXTENDED_RELEASE_TABLET | Freq: Two times a day (BID) | ORAL | Status: DC
  Administered 2013-11-06 – 2013-11-19 (×26): 15 mg via ORAL
  Filled 2013-11-06 (×26): qty 1

## 2013-11-06 MED ORDER — ONDANSETRON HCL 4 MG/2ML IJ SOLN
4.0000 mg | Freq: Four times a day (QID) | INTRAMUSCULAR | Status: DC | PRN
  Administered 2013-11-07 (×2): 4 mg via INTRAVENOUS
  Filled 2013-11-06 (×2): qty 2

## 2013-11-06 MED ORDER — SODIUM CHLORIDE 0.9 % IV SOLN
INTRAVENOUS | Status: DC
Start: 2013-11-06 — End: 2013-11-11
  Administered 2013-11-06 – 2013-11-11 (×7): via INTRAVENOUS

## 2013-11-06 MED ORDER — MORPHINE SULFATE 2 MG/ML IJ SOLN
1.0000 mg | INTRAMUSCULAR | Status: DC | PRN
  Administered 2013-11-06 – 2013-11-07 (×4): 1 mg via INTRAVENOUS
  Filled 2013-11-06 (×4): qty 1

## 2013-11-06 MED ORDER — ALUM & MAG HYDROXIDE-SIMETH 200-200-20 MG/5ML PO SUSP: 30.0000 mL | Freq: Four times a day (QID) | ORAL | Status: DC | PRN

## 2013-11-06 MED ORDER — ONDANSETRON HCL 4 MG PO TABS: 4.0000 mg | ORAL_TABLET | Freq: Four times a day (QID) | ORAL | Status: DC | PRN

## 2013-11-06 MED ORDER — PANTOPRAZOLE SODIUM 40 MG PO TBEC
40.0000 mg | DELAYED_RELEASE_TABLET | Freq: Two times a day (BID) | ORAL | Status: DC
  Administered 2013-11-06 – 2013-11-25 (×37): 40 mg via ORAL
  Filled 2013-11-06 (×36): qty 1

## 2013-11-06 MED ORDER — RITONAVIR 100 MG PO TABS
100.0000 mg | ORAL_TABLET | Freq: Every day | ORAL | Status: DC
  Administered 2013-11-07 – 2013-11-25 (×18): 100 mg via ORAL
  Filled 2013-11-06 (×21): qty 1

## 2013-11-06 MED ORDER — SPIRONOLACTONE 25 MG PO TABS
50.0000 mg | ORAL_TABLET | Freq: Every day | ORAL | Status: DC
  Administered 2013-11-06 – 2013-11-10 (×5): 50 mg via ORAL
  Filled 2013-11-06 (×5): qty 2

## 2013-11-06 NOTE — H&P (Signed)
[facility] History and Physical  Elim Economou WYO:[id number] DOB: Apr 09, 1976 DOA: 11/06/2013  Referring physician:  PCP  Dr [name] at [facility] in [place] [phone]   Chief Complaint: abdominal pain/nausea/vomiting/diarrhe  HPI: Samuel Schultz is a 38 y.o. 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 difficile 2 years ago, history of GERD and recent hospitalization for colitis present to emergency department with a chief complaint of intractable nausea vomiting and abdominal pain. He was recently admitted to [facility] and then transferred to [facility] for persistent fevers nausea vomiting and diarrhea. He was diagnosed with colitis of unknown etiology provided with antibiotics and supportive therapy improved and was discharged 4 days ago. He hospitalization he had a colonoscopy and EGD that were normal and unremarkable biopsies. He states that he was "okay at discharge" still having loose stools only fewer and able to tolerate full liquids. 2 days ago he developed nausea with persistent vomiting. He reports having 10-12 loose stools daily. He also has abdominal pain cramping-like with each bowel movement. He reports chills and states his temperature has gone to 101.5. He denies chest pain palpitation shortness of breath headache syncope or near-syncope.  In ED lab work significant for WBCs of 2.3 hemoglobin 12.3 platelets 68, abdominal x-ray withThere remains some wall thickening of several loops of bowel which given the history, may have neoplastic etiology. Scattered air-fluid levels may represent ileus or enteritis. Early obstruction is a consideration, although enteritis or ileus with tend to be more likely given the overall appearance of the bowel. No free air is seen.  His vital signs are stable he is afebrile he is not hypoxic.  In  the emergency department he is provided with fentanyl, Zofran, Phenergan as well as 1 L of normal saline intravenously.  Case discussed with Dr. work with gastroenterology who recommended that he be transferred back to [facility] given his recent discharge from there. Emergency department physician called [facility] he declined the transfer. Dr. work with gastroenterology recommended transferred supine to a higher level of care for GI services.  Review of Systems:  10 point review of systems completed and all systems are negative except as indicated in the history of present illness   Past Medical History  Diagnosis Date  . Cancer   . Lymphoma   . HIV disease   . Cirrhosis   . PTSD (post-traumatic stress disorder)   . Depressive disorder    Past Surgical History  Procedure Laterality Date  . Cholecystectomy    . Appendectomy     Social History:  reports that he has never smoked. He does not have any smokeless tobacco history on file. He reports that he does not drink alcohol or use illicit drugs. Currently lives in a facility in the area which is why he was brought to [facility]. Allergies  Allergen Reactions  . Codeine Anaphylaxis    No family history on file.   Prior to Admission medications   Medication Sig Start Date End Date Taking? Authorizing Provider  atovaquone (MEPRON) 750 MG/5ML suspension Take 1,500 mg by mouth daily.   Yes Historical Provider, MD  Darunavir Ethanolate (PREZISTA) 800 MG tablet Take 800 mg by mouth daily.   Yes Historical Provider, MD  docusate sodium (STOOL SOFTENER) 100 MG capsule Take 100 mg by mouth 2 (two) times daily.   Yes Historical Provider, MD  emtricitabine-tenofovir (TRUVADA)  200-300 MG per tablet Take 1 tablet by mouth at bedtime.   Yes Historical Provider, MD  escitalopram (LEXAPRO) 20 MG tablet Take 20 mg by mouth daily.   Yes Historical Provider, MD  furosemide (LASIX) 20 MG tablet Take 20 mg by mouth daily. 11/02/13 11/02/14 Yes  Historical Provider, MD  gabapentin (NEURONTIN) 300 MG capsule Take 300 mg by mouth 3 (three) times daily.   Yes Historical Provider, MD  morphine (MS CONTIN) 15 MG 12 hr tablet Take 15 mg by mouth 2 (two) times daily.   Yes Historical Provider, MD  pantoprazole (PROTONIX) 40 MG tablet Take 40 mg by mouth 2 (two) times daily.   Yes Historical Provider, MD  polyethylene glycol (MIRALAX / GLYCOLAX) packet Take 17 g by mouth daily.   Yes Historical Provider, MD  prazosin (MINIPRESS) 1 MG capsule Take 3 mg by mouth at bedtime.   Yes Historical Provider, MD  ritonavir (NORVIR) 100 MG capsule Take 100 mg by mouth daily with breakfast.   Yes Historical Provider, MD  senna (SENOKOT) 8.6 MG tablet Take 2 tablets by mouth 2 (two) times daily.    Yes Historical Provider, MD  spironolactone (ALDACTONE) 50 MG tablet Take 50 mg by mouth daily. 11/02/13 11/02/14 Yes Historical Provider, MD  sucralfate (CARAFATE) 1 G tablet Take 1 g by mouth 3 (three) times daily with meals.    Yes Historical Provider, MD  azithromycin (ZITHROMAX) 600 MG tablet Take 1,200 mg by mouth every 7 (seven) days.    Historical Provider, MD   Physical Exam: Filed Vitals:   11/06/13 1524  BP: 128/65  Pulse: 74  Temp: 98.5 F (36.9 C)  Resp: 16    BP 128/65  Pulse 74  Temp(Src) 98.5 F (36.9 C) (Oral)  Resp 16  Wt 99.791 kg (220 lb)  SpO2 97%  General:  Appears calm and comfortable Eyes: PERRL, normal lids, irises & conjunctiva ENT: grossly normal hearing, lips & tongue Neck: no LAD, masses or thyromegaly Cardiovascular: RRR, no m/r/g. No LE edema. Telemetry: SR, no arrhythmias  Respiratory: CTA bilaterally, no w/r/r. Normal respiratory effort. Abdomen: soft, hyperactive bowel sounds diffuse moderate tenderness to mild palpation. No mass organ megaly noted Skin: no rash or induration seen on limited exam Musculoskeletal: grossly normal tone BUE/BLE Psychiatric: grossly normal mood and affect, speech fluent and  appropriate Neurologic: grossly non-focal.          Labs on Admission:  Basic Metabolic Panel:  Recent Labs Lab 11/06/13 1020  NA 139  K 3.7  CL 108  CO2 20  GLUCOSE 85  BUN 5*  CREATININE 0.58  CALCIUM 8.6  MG 1.7   Liver Function Tests:  Recent Labs Lab 11/06/13 1020  AST 46*  ALT 22  ALKPHOS 115  BILITOT 3.4*  PROT 7.2  ALBUMIN 2.7*    Recent Labs Lab 11/06/13 1020  LIPASE 113*   No results found for this basename: AMMONIA,  in the last 168 hours CBC:  Recent Labs Lab 11/06/13 1020  WBC 2.3*  NEUTROABS 1.2*  HGB 12.3*  HCT 34.7*  MCV 102.1*  PLT 68*   Cardiac Enzymes: No results found for this basename: CKTOTAL, CKMB, CKMBINDEX, TROPONINI,  in the last 168 hours  BNP (last 3 results) No results found for this basename: PROBNP,  in the last 8760 hours CBG: No results found for this basename: GLUCAP,  in the last 168 hours  Radiological Exams on Admission: Dg Abd Acute W/chest  11/06/2013  CLINICAL DATA:  Fever and emesis ; lymphoma  EXAM: ACUTE ABDOMEN SERIES (ABDOMEN 2 VIEW & CHEST 1 VIEW)  COMPARISON:  Chest radiograph Oct 16, 2013; CT abdomen and pelvis Oct 22, 2013  FINDINGS: PA chest: There is no edema or consolidation. Heart size and pulmonary vascularity are normal. No adenopathy. Port-A-Cath tip is at the cavoatrial junction. No pneumothorax.  Supine and upright abdomen: There remains thickening of scattered bowel loops. There are scattered air-fluid levels. There is no well-defined obstruction or free air. There is moderate stool in the colon. There are surgical clips in the right upper quadrant.  IMPRESSION: There remains some wall thickening of several loops of bowel which given the history, may have neoplastic etiology. Scattered air-fluid levels may represent ileus or enteritis. Early obstruction is a consideration, although enteritis or ileus with tend to be more likely given the overall appearance of the bowel. No free air is seen.  There  is no lung edema or consolidation.   Electronically Signed   By: Lowella Grip M.D.   On: 11/06/2013 11:22    EKG:  Assessment/Plan Principal Problem: Nausea and vomiting in adult: Intractable. Etiology uncertain. Abdominal x-ray concerning for ileus or enteritis. Possible early obstruction as well. Will admit to medical floor at [facility]. Will provide clear liquids as tolerated. Continue IV fluids Zofran for nausea. If no improvement tomorrow consider GI consult.   Enteritis: I be related to infectious process given patient's fever. Per abdominal x-ray. Patient reports 10-12 loose stools per day. Will provide Flagyl. Will obtain GI pathogen panel. But supportive therapy via IV fluids and pain medicine. Active Problems:   Abdominal pain: See #1 and #2. Abdomen is soft. Supportive therapy as indicated above.    Diarrhea: May be related to enteritis as indicated #1. Concern for infectious process given his fever. Will start Flagyl. Will obtain GI pathogen panel. Monitor his electrolytes closely       AIDS: Continue with and to high retroviral therapy. He is on Mepron and azithromycin for prophylaxis. CD4 less than 20 last admission. Chart indicates last hospitalization started ganciclovir. This is not listed on his home medications. Consider ID consult.    Large cell lymphoma: Was previously treated with chemotherapy. Has been in remission per patient. Of note chart review indicates he has cirrhosis do to liver infiltration with lymphoma    Cirrhosis, non-alcoholic ET of abdomen pelvis done on 517 yields :interval worsening of ascites, pleural  effusions, and subcutaneous soft tissue edema. 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 Lab work indicates AST slightly elevated ALT and alkaline phosphatase within the limits of normal. Total bili 3.4. Consider GI consult  Code  Status: full Family Communication: none present Disposition Plan: back to facility when ready  Time spent: 65 minutes  [facility] Pager [phone]  **Disclaimer: This note may have been dictated with voice recognition software. Similar sounding words can inadvertently be transcribed and this note may contain transcription errors which may not have been corrected upon publication of note.**

## 2013-11-06 NOTE — ED Provider Notes (Signed)
CSN: [id number]     Arrival date & time 11/06/13  7408 History   First MD Initiated Contact with Patient 11/06/13 0935     Chief Complaint  Patient presents with  . Emesis     (Consider location/radiation/quality/duration/timing/severity/associated sxs/prior Treatment) Patient is a 38 y.o. male presenting with vomiting. The history is provided by the patient.  Emesis Associated symptoms: abdominal pain, chills and diarrhea   Associated symptoms: no headaches    patient has a history of AIDS and lymphoma. He was recently admitted hospital and then transferred to [facility] for fevers nausea vomiting diarrhea. He states he was discharged home on Thursday and is has a return of the nausea vomiting diarrhea and fevers. He states his entire abdomen hurts. His fevers have gone up to 101.5 at home. Past Medical History  Diagnosis Date  . Cancer   . Lymphoma   . HIV disease   . Cirrhosis   . PTSD (post-traumatic stress disorder)   . Depressive disorder    Past Surgical History  Procedure Laterality Date  . Cholecystectomy    . Appendectomy     No family history on file. History  Substance Use Topics  . Smoking status: Never Smoker   . Smokeless tobacco: Not on file  . Alcohol Use: No    Review of Systems  Constitutional: Positive for fever, chills and appetite change. Negative for activity change.  Eyes: Negative for pain.  Respiratory: Negative for chest tightness and shortness of breath.   Cardiovascular: Negative for chest pain and leg swelling.  Gastrointestinal: Positive for nausea, vomiting, abdominal pain and diarrhea.  Genitourinary: Negative for flank pain.  Musculoskeletal: Negative for back pain and neck stiffness.  Skin: Negative for rash.  Neurological: Negative for weakness, numbness and headaches.  Psychiatric/Behavioral: Negative for behavioral problems.      Allergies  Codeine  Home Medications   Prior to Admission medications   Medication Sig  Start Date End Date Taking? Authorizing Provider  atovaquone (MEPRON) 750 MG/5ML suspension Take 1,500 mg by mouth daily.    Historical Provider, MD  azithromycin (ZITHROMAX) 600 MG tablet Take 1,200 mg by mouth every 7 (seven) days.    Historical Provider, MD  Darunavir Ethanolate (PREZISTA) 800 MG tablet Take 800 mg by mouth daily.    Historical Provider, MD  emtricitabine-tenofovir (TRUVADA) 200-300 MG per tablet Take 1 tablet by mouth at bedtime.    Historical Provider, MD  escitalopram (LEXAPRO) 20 MG tablet Take 20 mg by mouth daily.    Historical Provider, MD  gabapentin (NEURONTIN) 300 MG capsule Take 300 mg by mouth 3 (three) times daily.    Historical Provider, MD  pantoprazole (PROTONIX) 40 MG tablet Take 40 mg by mouth 2 (two) times daily.    Historical Provider, MD  prazosin (MINIPRESS) 1 MG capsule Take 3 mg by mouth at bedtime.    Historical Provider, MD  ritonavir (NORVIR) 100 MG capsule Take 100 mg by mouth daily with breakfast.    Historical Provider, MD  sucralfate (CARAFATE) 1 G tablet Take 1 g by mouth 4 (four) times daily -  with meals and at bedtime.    Historical Provider, MD   BP 129/67  Pulse 72  Temp(Src) 99.6 F (37.6 C) (Rectal)  Resp 16  Wt 220 lb (99.791 kg)  SpO2 100% Physical Exam  Nursing note and vitals reviewed. Constitutional: He is oriented to person, place, and time. He appears well-developed and well-nourished.  HENT:  Head: Normocephalic and atraumatic.  Eyes: EOM are normal. Pupils are equal, round, and reactive to light.  Neck: Normal range of motion. Neck supple.  Cardiovascular: Normal rate, regular rhythm and normal heart sounds.   No murmur heard. Pulmonary/Chest: Effort normal and breath sounds normal.  Abdominal: Soft. He exhibits no distension and no mass. There is tenderness. There is no rebound and no guarding.  Mild diffuse tenderness without rebound or guarding. No hernias palpated  Musculoskeletal: Normal range of motion. He  exhibits no edema.  Neurological: He is alert and oriented to person, place, and time. No cranial nerve deficit.  Skin: Skin is warm and dry.  Patient feels warm  Psychiatric: He has a normal mood and affect.    ED Course  Procedures (including critical care time) Labs Review Labs Reviewed  CBC WITH DIFFERENTIAL  COMPREHENSIVE METABOLIC PANEL  LIPASE, BLOOD  LACTIC ACID, PLASMA  MAGNESIUM  URINALYSIS, ROUTINE W REFLEX MICROSCOPIC    Imaging Review No results found.   EKG Interpretation None      MDM   Final diagnoses:  None    Patient has returned as nausea vomiting diarrhea with abdominal pain. 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 Has had extensive workup including bone marrow biopsies and colonoscopy/EGD. Cultures have been negative. Initially discussed with [facility] hospital about transfer back to them. Lab work are at the levels at discharge. They do not think that he needs stress objective there admitted to the hospital here. I discussed with the hospitalist in gastroenterology at any time in the believe that he needs transfer to a higher level of care. I discussed with [facility] hospital again and they think after discussion with gastroenterology and the hospitalist liter there that he does not need to level of care to be transferred to them. Patient be transferred to [place]. Alvino Chapel, MD 11/06/13 1529

## 2013-11-06 NOTE — ED Notes (Signed)
Report attempted.  Nurse to call back.

## 2013-11-06 NOTE — ED Provider Notes (Deleted)
CSN: [id number]     Arrival date & time 11/06/13  6387 History   First MD Initiated Contact with Patient 11/06/13 0935     Chief Complaint  Patient presents with  . Emesis     (Consider location/radiation/quality/duration/timing/severity/associated sxs/prior Treatment) HPI  Past Medical History  Diagnosis Date  . Cancer   . Lymphoma   . HIV disease   . Cirrhosis   . PTSD (post-traumatic stress disorder)   . Depressive disorder    Past Surgical History  Procedure Laterality Date  . Cholecystectomy    . Appendectomy     No family history on file. History  Substance Use Topics  . Smoking status: Never Smoker   . Smokeless tobacco: Not on file  . Alcohol Use: No    Review of Systems    Allergies  Codeine  Home Medications   Prior to Admission medications   Medication Sig Start Date End Date Taking? Authorizing Provider  atovaquone (MEPRON) 750 MG/5ML suspension Take 1,500 mg by mouth daily.   Yes Historical Provider, MD  Darunavir Ethanolate (PREZISTA) 800 MG tablet Take 800 mg by mouth daily.   Yes Historical Provider, MD  docusate sodium (STOOL SOFTENER) 100 MG capsule Take 100 mg by mouth 2 (two) times daily.   Yes Historical Provider, MD  emtricitabine-tenofovir (TRUVADA) 200-300 MG per tablet Take 1 tablet by mouth at bedtime.   Yes Historical Provider, MD  escitalopram (LEXAPRO) 20 MG tablet Take 20 mg by mouth daily.   Yes Historical Provider, MD  furosemide (LASIX) 20 MG tablet Take 20 mg by mouth daily. 11/02/13 11/02/14 Yes Historical Provider, MD  gabapentin (NEURONTIN) 300 MG capsule Take 300 mg by mouth 3 (three) times daily.   Yes Historical Provider, MD  morphine (MS CONTIN) 15 MG 12 hr tablet Take 15 mg by mouth 2 (two) times daily.   Yes Historical Provider, MD  pantoprazole (PROTONIX) 40 MG tablet Take 40 mg by mouth 2 (two) times daily.   Yes Historical Provider, MD  polyethylene glycol (MIRALAX / GLYCOLAX) packet Take 17 g by mouth daily.   Yes  Historical Provider, MD  prazosin (MINIPRESS) 1 MG capsule Take 3 mg by mouth at bedtime.   Yes Historical Provider, MD  ritonavir (NORVIR) 100 MG capsule Take 100 mg by mouth daily with breakfast.   Yes Historical Provider, MD  senna (SENOKOT) 8.6 MG tablet Take 2 tablets by mouth 2 (two) times daily.    Yes Historical Provider, MD  spironolactone (ALDACTONE) 50 MG tablet Take 50 mg by mouth daily. 11/02/13 11/02/14 Yes Historical Provider, MD  sucralfate (CARAFATE) 1 G tablet Take 1 g by mouth 3 (three) times daily with meals.    Yes Historical Provider, MD  azithromycin (ZITHROMAX) 600 MG tablet Take 1,200 mg by mouth every 7 (seven) days.    Historical Provider, MD   BP 125/67  Pulse 72  Temp(Src) 98.5 F (36.9 C) (Oral)  Resp 16  Wt 220 lb (99.791 kg)  SpO2 99% Physical Exam  ED Course  Procedures (including critical care time) Labs Review Labs Reviewed  CBC WITH DIFFERENTIAL - Abnormal; Notable for the following:    WBC 2.3 (*)    RBC 3.40 (*)    Hemoglobin 12.3 (*)    HCT 34.7 (*)    MCV 102.1 (*)    MCH 36.2 (*)    RDW 16.8 (*)    Platelets 68 (*)    Neutro Abs 1.2 (*)    All  other components within normal limits  COMPREHENSIVE METABOLIC PANEL - Abnormal; Notable for the following:    BUN 5 (*)    Albumin 2.7 (*)    AST 46 (*)    Total Bilirubin 3.4 (*)    All other components within normal limits  LIPASE, BLOOD - Abnormal; Notable for the following:    Lipase 113 (*)    All other components within normal limits  URINALYSIS, ROUTINE W REFLEX MICROSCOPIC - Abnormal; Notable for the following:    Hgb urine dipstick TRACE (*)    Bilirubin Urine MODERATE (*)    Protein, ur TRACE (*)    All other components within normal limits  LACTIC ACID, PLASMA  MAGNESIUM  URINE MICROSCOPIC-ADD ON    Imaging Review Dg Abd Acute W/chest  11/06/2013   CLINICAL DATA:  Fever and emesis ; lymphoma  EXAM: ACUTE ABDOMEN SERIES (ABDOMEN 2 VIEW & CHEST 1 VIEW)  COMPARISON:  Chest  radiograph Oct 16, 2013; CT abdomen and pelvis Oct 22, 2013  FINDINGS: PA chest: There is no edema or consolidation. Heart size and pulmonary vascularity are normal. No adenopathy. Port-A-Cath tip is at the cavoatrial junction. No pneumothorax.  Supine and upright abdomen: There remains thickening of scattered bowel loops. There are scattered air-fluid levels. There is no well-defined obstruction or free air. There is moderate stool in the colon. There are surgical clips in the right upper quadrant.  IMPRESSION: There remains some wall thickening of several loops of bowel which given the history, may have neoplastic etiology. Scattered air-fluid levels may represent ileus or enteritis. Early obstruction is a consideration, although enteritis or ileus with tend to be more likely given the overall appearance of the bowel. No free air is seen.  There is no lung edema or consolidation.   Electronically Signed   By: Lowella Grip M.D.   On: 11/06/2013 11:22     EKG Interpretation None      MDM   Final diagnoses:  None        Ovid Curd R. Alvino Chapel, MD 11/06/13 1515

## 2013-11-06 NOTE — Progress Notes (Signed)
[facility] has called and report given. They are on the way to the floor. Gave report to [employer] at [facility].

## 2013-11-06 NOTE — Progress Notes (Signed)
Pt left floor via stretcher with [facility]. VSS. IV fluids infusing. No sign of distress.

## 2013-11-06 NOTE — Progress Notes (Signed)
Patient arrived to unit, patient and vital signs are stable. Nurse report received from nurse at [facility], will continue to monitor patient. Ruben Im RN

## 2013-11-06 NOTE — ED Notes (Signed)
Started Saturday with n/v/d. Recently admitted and treated at [facility] for bone marrow infection per EMS.

## 2013-11-06 NOTE — H&P (Signed)
Patient seen, independently examined and chart reviewed. I agree with exam, assessment and plan discussed with Dyanne Carrel, NP.  38yom with h/o AIDS, cirrhosis, large cell lymphoma, pancytopenia, AIDS who was hospitalized at [facility] from 5/11-5/17 for acute colitis that failed to respond to Cipro, Flagyl, fluconazole, ganciclovir and was eventually transferred to [facility] for further evaluation where he was hospitalized from 5/17-5/28, discharged back to [place] facility. Extensive evaluation was conducted including EGD and colonoscopy and patient was seen both by Infectious Disease and Gastroenterology. Patient was seen by hematology for pancytopenia and patient underwent bone marrow biopsy. It appears that his colitis and pancytopenia were thought be secondary to AIDS and patient was discharged without antibiotics 5/27.   Approximately 24 hours later he developed severe watery diarrhea without blood with numerous episodes, several episodes of vomiting, and recurrent epigastric and periumbilical abdominal pain. For the last 2-3 days he has had numerous episodes of diarrhea and reports poor oral intake and low grade fever.  Afebrile, VSS in ED. CMP with hyperbilirubinemia, otherwise without change. CBC without significant change. Lactic acid normal. AXR demonstrated wall thickening of several loops of bowel and scattered air-fluid levels, ileus or enteritis thought to be more likely than early obstruction.  Objective:  Gen. Alert, calm, appears ill but not toxic.  Psych. Grossly normal mood and affect. Speech fluent and clear.  Eyes. PERRL. Normal lids, irises.  ENT. Lips and tongue appear unremarkable  Cardiovascular. Regular rate and rhythm, no murmur, rug or gallop. No LE edema.  Respiratory. Clear to auscultation bilaterally, no wheezes, rales or rhonchi. Normal respiratory effort.  Abdomen. Soft, well-healed lower vertical incision. No rash. Mild generalized tenderness, more  mid-epigastric towards umbilicus.  Skin. No rash or induration seen  Musculoskeletal. Grossly unremarkable  Neurologic. Grossly non-focal  38 year old man with AIDS, recurrent diarrhea, abdominal pain with recent extensive workup at [facility] after transfer from [facility]. EDP Dr. Alvino Chapel discussed with [facility] team who recommended supportive care and declined transfer. Case was discussed with gastroenterologist Dr. Gala Romney (who had evaluated the patient on last admission at [facility]) and recommended transferring patient to a higher level of care. Patient with history of fragmented care between [place], [place], [facility]. Discussed transfer options with patient and he elected transfer to [facility].  Plan transfer to [facility] Team 6 Dr. Charlies Silvers accepting. Supportive care, IVF, analgesia. ID consultation, consider GI consultation.   Murray Hodgkins, MD [facility] [phone]

## 2013-11-07 ENCOUNTER — Inpatient Hospital Stay (HOSPITAL_COMMUNITY): Payer: Medicaid Other

## 2013-11-07 DIAGNOSIS — C8589 Other specified types of non-Hodgkin lymphoma, extranodal and solid organ sites: Secondary | ICD-10-CM

## 2013-11-07 DIAGNOSIS — R197 Diarrhea, unspecified: Secondary | ICD-10-CM

## 2013-11-07 DIAGNOSIS — D61818 Other pancytopenia: Secondary | ICD-10-CM | POA: Diagnosis present

## 2013-11-07 DIAGNOSIS — Z21 Asymptomatic human immunodeficiency virus [HIV] infection status: Secondary | ICD-10-CM

## 2013-11-07 LAB — TSH: TSH: 0.635 u[IU]/mL (ref 0.350–4.500)

## 2013-11-07 MED ORDER — FENTANYL CITRATE 0.05 MG/ML IJ SOLN: 100.0000 ug | Freq: Once | INTRAMUSCULAR | Status: DC

## 2013-11-07 MED ORDER — PROMETHAZINE HCL 25 MG/ML IJ SOLN
12.5000 mg | Freq: Once | INTRAMUSCULAR | Status: AC
  Administered 2013-11-07: 12.5 mg via INTRAVENOUS
  Filled 2013-11-07: qty 1

## 2013-11-07 MED ORDER — HYDROMORPHONE HCL PF 1 MG/ML IJ SOLN
1.0000 mg | INTRAMUSCULAR | Status: DC | PRN
  Administered 2013-11-07 – 2013-11-08 (×6): 1 mg via INTRAVENOUS
  Filled 2013-11-07 (×7): qty 1

## 2013-11-07 MED ORDER — IOHEXOL 300 MG/ML  SOLN
80.0000 mL | Freq: Once | INTRAMUSCULAR | Status: AC | PRN
  Administered 2013-11-07: 80 mL via INTRAVENOUS

## 2013-11-07 MED ORDER — AZITHROMYCIN 600 MG PO TABS
1200.0000 mg | ORAL_TABLET | ORAL | Status: DC
  Administered 2013-11-10 – 2013-11-24 (×3): 1200 mg via ORAL
  Filled 2013-11-07 (×3): qty 2

## 2013-11-07 MED ORDER — SULFAMETHOXAZOLE-TMP DS 800-160 MG PO TABS
1.0000 | ORAL_TABLET | Freq: Every day | ORAL | Status: DC
  Administered 2013-11-07 – 2013-11-08 (×2): 1 via ORAL
  Filled 2013-11-07 (×2): qty 1

## 2013-11-07 MED ORDER — MORPHINE SULFATE 2 MG/ML IJ SOLN: 2.0000 mg | Freq: Once | INTRAMUSCULAR | Status: DC

## 2013-11-07 MED ORDER — MORPHINE SULFATE 2 MG/ML IJ SOLN
2.0000 mg | Freq: Once | INTRAMUSCULAR | Status: AC
  Administered 2013-11-07: 2 mg via INTRAVENOUS
  Filled 2013-11-07: qty 1

## 2013-11-07 MED ORDER — MORPHINE SULFATE 2 MG/ML IJ SOLN
2.0000 mg | INTRAMUSCULAR | Status: DC | PRN
  Administered 2013-11-07: 2 mg via INTRAVENOUS
  Filled 2013-11-07: qty 1

## 2013-11-07 MED ORDER — IOHEXOL 300 MG/ML  SOLN
25.0000 mL | INTRAMUSCULAR | Status: AC
  Administered 2013-11-07 (×2): 25 mL via ORAL

## 2013-11-07 MED ORDER — PROMETHAZINE HCL 25 MG/ML IJ SOLN
25.0000 mg | Freq: Four times a day (QID) | INTRAMUSCULAR | Status: DC | PRN
  Administered 2013-11-07 – 2013-11-08 (×3): 25 mg via INTRAVENOUS
  Filled 2013-11-07 (×3): qty 1

## 2013-11-07 NOTE — Progress Notes (Signed)
UR completed.  Kapono Luhn, RN BSN MHA CCM Trauma/Neuro ICU Case Manager [phone]  

## 2013-11-07 NOTE — Consult Note (Signed)
  [facility]  Total days of antibiotics 2        Day 2 metronidazole               Reason for Consult:hiv, chronic diarrhea    Referring Physician: devine  Principal Problem:   Nausea and vomiting in adult Active Problems:   AIDS   Large cell lymphoma   Cirrhosis, non-alcoholic   Pancytopenia   Enteritis   Other pancytopenia    HPI: Samuel Schultz is a 38 y.o. male with hx of HIV/AIDS, CD 20/VL 395(may 2015) on truvada/DRVr also hx of large cell lymphoma s/p R-CHOP finished in 2012 c/b pancytopenia and cirrhosis 2/2 ?chemo. He was recently hospitalized for colits from 5/11-5/17([facility]) - 5/27 discharged home, however he started to have new onset profuse watery diarrhea with fatigue and weakness, thus readmitted on 11/06/2013. The patient had a extensive work up at [facility] in May admission including colonoscopy, infectious work up that did not determine cause of diarrhea. He has relocated to [place] and wants to establish care for hiv.he states that he was mildly improved when he discharged home but then in 3 days started to have worsening diarrhea, vomiting noted on 5/31.no overt fever, but having chills.fatigue. Feeling poorly.  Non immune to hep a, hep b, no HCV  Past Medical History  Diagnosis Date  . Cancer   . Lymphoma   . HIV disease   . Cirrhosis   . PTSD (post-traumatic stress disorder)   . Depressive disorder     Allergies:  Allergies  Allergen Reactions  . Codeine Anaphylaxis    MEDICATIONS: . atovaquone  1,500 mg Oral Daily  . [START ON 11/10/2013] azithromycin  1,200 mg Oral Weekly  . Darunavir Ethanolate  800 mg Oral Daily  . emtricitabine-tenofovir  1 tablet Oral QHS  . escitalopram  20 mg Oral Daily  . gabapentin  300 mg Oral TID  . metronidazole  500 mg Intravenous Q8H  . morphine  15 mg Oral BID  . pantoprazole  40 mg Oral BID  . ritonavir  100 mg Oral Q breakfast  . spironolactone  50 mg Oral Daily  . sucralfate  1  g Oral TID WC  . sulfamethoxazole-trimethoprim  1 tablet Oral Daily    History  Substance Use Topics  . Smoking status: Never Smoker   . Smokeless tobacco: Not on file  . Alcohol Use: No    No family history on file.   Review of Systems  Constitutional: Negative for fever, chills, diaphoresis, activity change, appetite change, fatigue and unexpected weight change.  HENT: Negative for congestion, sore throat, rhinorrhea, sneezing, trouble swallowing and sinus pressure.  Eyes: Negative for photophobia and visual disturbance.  Respiratory: Negative for cough, chest tightness, shortness of breath, wheezing and stridor.  Cardiovascular: Negative for chest pain, palpitations and leg swelling.  Gastrointestinal: positive for nausea, vomiting, abdominal pain, diarrhea, but no constipation, blood in stool, abdominal distention and anal bleeding.  Genitourinary: Negative for dysuria, hematuria, flank pain and difficulty urinating.  Musculoskeletal: Negative for myalgias, back pain, joint swelling, arthralgias and gait problem.  Skin: Negative for color change, pallor, rash and wound.  Neurological: Negative for dizziness, tremors, weakness and light-headedness.  Hematological: Negative for adenopathy. Does not bruise/bleed easily.  Psychiatric/Behavioral: Negative for behavioral problems, confusion, sleep disturbance, dysphoric mood, decreased concentration and agitation.     OBJECTIVE: Temp:  [97.6 F (36.4 C)-98.3 F (36.8 C)] 97.6 F (36.4 C) (06/02 1359) Pulse Rate:  [  69-96] 96 (06/02 1359) Resp:  [18-20] 18 (06/02 1359) BP: (102-132)/(63-82) 132/82 mmHg (06/02 1359) SpO2:  [95 %-98 %] 95 % (06/02 1359) Weight:  [203 lb 9.6 oz (92.352 kg)-220 lb (99.791 kg)] 203 lb 9.6 oz (92.352 kg) (06/01 2100)  Constitutional: He is oriented to person, place, and time. He appears well-developed and well-nourished. In mild distress. HENT:  Mouth/Throat: Oropharynx is clear and moist. No  oropharyngeal exudate.  Cardiovascular: Normal rate, regular rhythm and normal heart sounds. Exam reveals no gallop and no friction rub.  No murmur heard.  Pulmonary/Chest: Effort normal and breath sounds normal. No respiratory distress. He has no wheezes.  Abdominal: Soft. Bowel sounds are normal. He exhibits no distension. There is no tenderness.  Lymphadenopathy:  He has no cervical adenopathy.  Neurological: He is alert and oriented to person, place, and time.  Skin: Skin is warm and dry. No rash noted. No erythema.  Psychiatric: He has a normal mood and affect. His behavior is normal.    LABS: Results for orders placed during the hospital encounter of 11/06/13 (from the past 48 hour(s))  CBC WITH DIFFERENTIAL     Status: Abnormal   Collection Time    11/06/13 10:20 AM      Result Value Ref Range   WBC 2.3 (*) 4.0 - 10.5 K/uL   RBC 3.40 (*) 4.22 - 5.81 MIL/uL   Hemoglobin 12.3 (*) 13.0 - 17.0 g/dL   HCT 34.7 (*) 39.0 - 52.0 %   MCV 102.1 (*) 78.0 - 100.0 fL   MCH 36.2 (*) 26.0 - 34.0 pg   MCHC 35.4  30.0 - 36.0 g/dL   RDW 16.8 (*) 11.5 - 15.5 %   Platelets 68 (*) 150 - 400 K/uL   Comment: SPECIMEN CHECKED FOR CLOTS     PLATELET COUNT CONFIRMED BY SMEAR   Neutrophils Relative % 51  43 - 77 %   Neutro Abs 1.2 (*) 1.7 - 7.7 K/uL   Lymphocytes Relative 35  12 - 46 %   Lymphs Abs 0.8  0.7 - 4.0 K/uL   Monocytes Relative 10  3 - 12 %   Monocytes Absolute 0.2  0.1 - 1.0 K/uL   Eosinophils Relative 3  0 - 5 %   Eosinophils Absolute 0.1  0.0 - 0.7 K/uL   Basophils Relative 1  0 - 1 %   Basophils Absolute 0.0  0.0 - 0.1 K/uL   Smear Review PLATELETS APPEAR DECREASED    COMPREHENSIVE METABOLIC PANEL     Status: Abnormal   Collection Time    11/06/13 10:20 AM      Result Value Ref Range   Sodium 139  137 - 147 mEq/L   Potassium 3.7  3.7 - 5.3 mEq/L   Chloride 108  96 - 112 mEq/L   CO2 20  19 - 32 mEq/L   Glucose, Bld 85  70 - 99 mg/dL   BUN 5 (*) 6 - 23 mg/dL   Creatinine,  Ser 0.58  0.50 - 1.35 mg/dL   Calcium 8.6  8.4 - 10.5 mg/dL   Total Protein 7.2  6.0 - 8.3 g/dL   Albumin 2.7 (*) 3.5 - 5.2 g/dL   AST 46 (*) 0 - 37 U/L   ALT 22  0 - 53 U/L   Alkaline Phosphatase 115  39 - 117 U/L   Total Bilirubin 3.4 (*) 0.3 - 1.2 mg/dL   GFR calc non Af Amer >90  >90 mL/min     GFR calc Af Amer >90  >90 mL/min   Comment: (NOTE)     The eGFR has been calculated using the CKD EPI equation.     This calculation has not been validated in all clinical situations.     eGFR's persistently <90 mL/min signify possible Chronic Kidney     Disease.  LIPASE, BLOOD     Status: Abnormal   Collection Time    11/06/13 10:20 AM      Result Value Ref Range   Lipase 113 (*) 11 - 59 U/L  MAGNESIUM     Status: None   Collection Time    11/06/13 10:20 AM      Result Value Ref Range   Magnesium 1.7  1.5 - 2.5 mg/dL  TSH     Status: None   Collection Time    11/06/13 10:22 AM      Result Value Ref Range   TSH 0.635  0.350 - 4.500 uIU/mL   Comment: Performed at [facility]  LACTIC ACID, PLASMA     Status: None   Collection Time    11/06/13 10:30 AM      Result Value Ref Range   Lactic Acid, Venous 1.4  0.5 - 2.2 mmol/L  URINALYSIS, ROUTINE W REFLEX MICROSCOPIC     Status: Abnormal   Collection Time    11/06/13 11:45 AM      Result Value Ref Range   Color, Urine YELLOW  YELLOW   APPearance CLEAR  CLEAR   Specific Gravity, Urine 1.020  1.005 - 1.030   pH 6.5  5.0 - 8.0   Glucose, UA NEGATIVE  NEGATIVE mg/dL   Hgb urine dipstick TRACE (*) NEGATIVE   Bilirubin Urine MODERATE (*) NEGATIVE   Ketones, ur NEGATIVE  NEGATIVE mg/dL   Protein, ur TRACE (*) NEGATIVE mg/dL   Urobilinogen, UA 0.2  0.0 - 1.0 mg/dL   Nitrite NEGATIVE  NEGATIVE   Leukocytes, UA NEGATIVE  NEGATIVE  URINE MICROSCOPIC-ADD ON     Status: None   Collection Time    11/06/13 11:45 AM      Result Value Ref Range   RBC / HPF 0-2  <3 RBC/hpf    MICRO: Stool studies IMAGING: Dg Abd Acute  W/chest  11/06/2013   CLINICAL DATA:  Fever and emesis ; lymphoma  EXAM: ACUTE ABDOMEN SERIES (ABDOMEN 2 VIEW & CHEST 1 VIEW)  COMPARISON:  Chest radiograph Oct 16, 2013; CT abdomen and pelvis Oct 22, 2013  FINDINGS: PA chest: There is no edema or consolidation. Heart size and pulmonary vascularity are normal. No adenopathy. Port-A-Cath tip is at the cavoatrial junction. No pneumothorax.  Supine and upright abdomen: There remains thickening of scattered bowel loops. There are scattered air-fluid levels. There is no well-defined obstruction or free air. There is moderate stool in the colon. There are surgical clips in the right upper quadrant.  IMPRESSION: There remains some wall thickening of several loops of bowel which given the history, may have neoplastic etiology. Scattered air-fluid levels may represent ileus or enteritis. Early obstruction is a consideration, although enteritis or ileus with tend to be more likely given the overall appearance of the bowel. No free air is seen.  There is no lung edema or consolidation.   Electronically Signed   By: William  Woodruff M.D.   On: 11/06/2013 11:22    HISTORICAL MICRO/IMAGING  Assessment/Plan:    Diarrhea = if diarrheal studies are negative, can treat with lomotil. Await study results. Numerous   causes. Unclear if we will find results since he had comprehensive work up within the last 2 wk, this may very well be aids related diarrhea. Continue with supportive care  Pancytopenia = sequelae from his lymphoma treatment. He mentions that his marrow has been suppressed since his cancer. Will need to track down path results from bone marrow aspirate that was done at [facility]. See if they did flow cytometry and need to do further work up of bone sclerotic lesions found at outside imaging from last hospitalization  oi proph = continue with atovaquone  hiv = continue with truvada/drv/rit. Will check viral load

## 2013-11-07 NOTE — Progress Notes (Signed)
Patient ID: Samuel Schultz, male   DOB: [birth year], 38 y.o.   MRN: [id number] [facility] PROGRESS NOTE  Samuel Schultz PPJ:[id number] DOB: [birth year] DOA: 11/06/2013 PCP: PROVIDER NOT IN SYSTEM  Brief narrative: 38 year old male with past medical history of HIV/AIDS on HAART (last CD4 10/18/2013 20), large cell lymphoma with liver infiltration (treawted with R-CHOP in 2010-2012 (followed at [facility]), recent admission for colitis (discharged 10/22/2013) who now presented to [facility] ED 11/06/2013 with ongoing fatigue, weakness, diarrhea. He was transferred to [facility] for further care per GI (Dr. Dudley Major in [facility]) recommendations as he thought that the pt requires higher level of care. Pt was started on flagyl for treatment of possible enteritis. HIs stool studies are pending. Abd x ray done on admission showed few findings worth mentioning such as possible ileus, enteritis or neoplastic etiology.  Assessment/Plan:  Principal Problem:   Nausea, vomiting, diarrhea, abdominal pain - possible viral gastroenteritis - need to collect stool for C.diff, stool culture, ova and parasites - started on flagyl on admission - appreciate ID consult and recommendations  Active Problems:   Abdominal pain - likely enteritis  - abd x ray showed possible ileus or enteritis or neoplastic etiology; pt did not have emesis today but has nausea. He said he seems to tolerate CLD - follow up CT abdomen results    HIV/AIDS on HAART - appreciate ID consult and recommendations - pt is on HAART regimen - also placed on azithromycin and bactrim for PCP and MAC prophylaxis   Large cell lymphoma - Was previously treated with chemotherapy. Reports that he is in remission. 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   Pancytopenia - likely bone marrow suppression from HIV/AIDS as well as large cell lymphoma - will continue to monitor CBC daily - WBC count is 2.3, hemoglobin 12.3 - platelet count is 68 which is actually better then  what was seen on recent admission - no signs of bleed   Depression - continue lexapro   Liver cirrhosis - continue spironolactone    DVT prophylaxis: SCD's bilaterally due to thrombocytopenia  Code Status: full code  Family Communication: plan of care discussed with the patient Disposition Plan: home when stable   Robbie Lis, MD  [facility] Pager [phone]  If 7PM-7AM, please contact night-coverage [email or web address] Password [email or web address] 11/07/2013, 11:35 AM   LOS: 1 day   Consultants:  ID  Procedures:  None   Antibiotics:  HAART  Flagyl 11/07/2103 -->  Azithromycin and bactrim for MAC and PCP prophylaxis respectively  HPI/Subjective: No acute overnight events.  Objective: Filed Vitals:   11/06/13 1722 11/06/13 1900 11/06/13 2100 11/07/13 0641  BP: 108/67 117/66 116/63 102/64  Pulse: 74 71 78 69  Temp: 98.3 F (36.8 C) 98.1 F (36.7 C) 97.9 F (36.6 C) 98.1 F (36.7 C)  TempSrc: Oral Oral Oral Oral  Resp: 18 20 18 20   Height: 5\' 9"  (1.753 m)  5\' 9"  (1.753 m)   Weight: 99.791 kg (220 lb)  92.352 kg (203 lb 9.6 oz)   SpO2: 97% 98% 98% 95%    Intake/Output Summary (Last 24 hours) at 11/07/13 1135 Last data filed at 11/06/13 2113  Gross per 24 hour  Intake    360 ml  Output      0 ml  Net    360 ml    Exam:   General:  Pt is alert, follows commands appropriately, pale but no acute distress  Cardiovascular: Regular rate and rhythm, S1/S2, no  murmurs  Respiratory: Clear to auscultation bilaterally, no wheezing, no crackles, no rhonchi  Abdomen: Soft, non tender, non distended, bowel sounds present  Extremities: No edema, pulses DP and PT palpable bilaterally  Neuro: Grossly nonfocal  Data Reviewed: Basic Metabolic Panel:  Recent Labs Lab 11/06/13 1020  NA 139  K 3.7  CL 108  CO2 20  GLUCOSE 85  BUN 5*  CREATININE 0.58  CALCIUM 8.6  MG 1.7   Liver Function Tests:  Recent Labs Lab 11/06/13 1020  AST 46*  ALT 22  ALKPHOS 115   BILITOT 3.4*  PROT 7.2  ALBUMIN 2.7*    Recent Labs Lab 11/06/13 1020  LIPASE 113*   No results found for this basename: AMMONIA,  in the last 168 hours CBC:  Recent Labs Lab 11/06/13 1020  WBC 2.3*  NEUTROABS 1.2*  HGB 12.3*  HCT 34.7*  MCV 102.1*  PLT 68*   Cardiac Enzymes: No results found for this basename: CKTOTAL, CKMB, CKMBINDEX, TROPONINI,  in the last 168 hours BNP: No components found with this basename: POCBNP,  CBG: No results found for this basename: GLUCAP,  in the last 168 hours  No results found for this or any previous visit (from the past 240 hour(s)).   Studies: Dg Abd Acute W/chest 11/06/2013     IMPRESSION: There remains some wall thickening of several loops of bowel which given the history, may have neoplastic etiology. Scattered air-fluid levels may represent ileus or enteritis. Early obstruction is a consideration, although enteritis or ileus with tend to be more likely given the overall appearance of the bowel. No free air is seen.  There is no lung edema or consolidation.     Scheduled Meds: . atovaquone  1,500 mg Oral Daily  . Darunavir Ethanolate  800 mg Oral Daily  . emtricitabine-tenofovir  1 tablet Oral QHS  . escitalopram  20 mg Oral Daily  . gabapentin  300 mg Oral TID  . metronidazole  500 mg Intravenous Q8H  . morphine  15 mg Oral BID  . pantoprazole  40 mg Oral BID  . ritonavir  100 mg Oral Q breakfast  . spironolactone  50 mg Oral Daily  . sucralfate  1 g Oral TID WC   Continuous Infusions: . sodium chloride 50 mL/hr at 11/06/13 1829

## 2013-11-08 DIAGNOSIS — R112 Nausea with vomiting, unspecified: Secondary | ICD-10-CM

## 2013-11-08 LAB — COMPREHENSIVE METABOLIC PANEL
ALBUMIN: 2.1 g/dL — AB (ref 3.5–5.2)
ALT: 18 U/L (ref 0–53)
AST: 52 U/L — AB (ref 0–37)
Alkaline Phosphatase: 81 U/L (ref 39–117)
BUN: 11 mg/dL (ref 6–23)
CO2: 14 mEq/L — ABNORMAL LOW (ref 19–32)
CREATININE: 1.81 mg/dL — AB (ref 0.50–1.35)
Calcium: 8.2 mg/dL — ABNORMAL LOW (ref 8.4–10.5)
Chloride: 107 mEq/L (ref 96–112)
GFR calc Af Amer: 53 mL/min — ABNORMAL LOW (ref >90)
GFR, EST NON AFRICAN AMERICAN: 46 mL/min — AB (ref >90)
Glucose, Bld: 160 mg/dL — ABNORMAL HIGH (ref 70–99)
Potassium: 3.9 mEq/L (ref 3.7–5.3)
Sodium: 134 mEq/L — ABNORMAL LOW (ref 137–147)
Total Bilirubin: 1.8 mg/dL — ABNORMAL HIGH (ref 0.3–1.2)
Total Protein: 5.9 g/dL — ABNORMAL LOW (ref 6.0–8.3)

## 2013-11-08 LAB — GI PATHOGEN PANEL BY PCR, STOOL
C DIFFICILE TOXIN A/B: NEGATIVE
Campylobacter by PCR: NEGATIVE
Cryptosporidium by PCR: NEGATIVE
E coli (ETEC) LT/ST: NEGATIVE
E coli (STEC): NEGATIVE
E coli 0157 by PCR: NEGATIVE
G LAMBLIA BY PCR: NEGATIVE
Norovirus GI/GII: NEGATIVE
ROTAVIRUS A BY PCR: NEGATIVE
SALMONELLA BY PCR: NEGATIVE
SHIGELLA BY PCR: NEGATIVE

## 2013-11-08 LAB — CLOSTRIDIUM DIFFICILE BY PCR: Toxigenic C. Difficile by PCR: NEGATIVE

## 2013-11-08 LAB — CBC
HCT: 33 % — ABNORMAL LOW (ref 39.0–52.0)
Hemoglobin: 11.6 g/dL — ABNORMAL LOW (ref 13.0–17.0)
MCH: 36.4 pg — AB (ref 26.0–34.0)
MCHC: 35.2 g/dL (ref 30.0–36.0)
MCV: 103.4 fL — AB (ref 78.0–100.0)
PLATELETS: 60 10*3/uL — AB (ref 150–400)
RBC: 3.19 MIL/uL — ABNORMAL LOW (ref 4.22–5.81)
RDW: 16.8 % — AB (ref 11.5–15.5)
WBC: 1 10*3/uL — CL (ref 4.0–10.5)

## 2013-11-08 MED ORDER — HYDROMORPHONE HCL PF 1 MG/ML IJ SOLN
2.0000 mg | INTRAMUSCULAR | Status: DC | PRN
  Administered 2013-11-08 – 2013-11-25 (×67): 2 mg via INTRAVENOUS
  Filled 2013-11-08 (×74): qty 2

## 2013-11-08 MED ORDER — PROMETHAZINE HCL 25 MG/ML IJ SOLN
12.5000 mg | Freq: Four times a day (QID) | INTRAMUSCULAR | Status: DC | PRN
  Administered 2013-11-08 – 2013-11-24 (×22): 12.5 mg via INTRAVENOUS
  Filled 2013-11-08 (×21): qty 1

## 2013-11-08 MED ORDER — HYDROMORPHONE HCL PF 1 MG/ML IJ SOLN
1.0000 mg | Freq: Once | INTRAMUSCULAR | Status: AC
  Administered 2013-11-08: 1 mg via INTRAVENOUS

## 2013-11-08 NOTE — Progress Notes (Signed)
[facility]    Date of Admission:  11/06/2013   Total days of antibiotics 3        Day 3 metronidazole           ID: Samuel Schultz is a 38 y.o. male with advanced hiv/aids, CD 4 count 20/VL 400 on truvada/DRVr who is readmitted for gi symptoms of n//v/diarrhea <5 days after discharge from [facility] for similar admission Principal Problem:   Nausea and vomiting in adult Active Problems:   AIDS   Large cell lymphoma   Cirrhosis, non-alcoholic   Pancytopenia   Enteritis   Other pancytopenia    Subjective: Nausea and vomiting improved but still having diarrhea and fatigue  Medications:  . atovaquone  1,500 mg Oral Daily  . [START ON 11/10/2013] azithromycin  1,200 mg Oral Weekly  . Darunavir Ethanolate  800 mg Oral Daily  . emtricitabine-tenofovir  1 tablet Oral QHS  . escitalopram  20 mg Oral Daily  . gabapentin  300 mg Oral TID  . metronidazole  500 mg Intravenous Q8H  . morphine  15 mg Oral BID  . pantoprazole  40 mg Oral BID  . ritonavir  100 mg Oral Q breakfast  . spironolactone  50 mg Oral Daily  . sucralfate  1 g Oral TID WC    Objective: Vital signs in last 24 hours: Temp:  [97.5 F (36.4 C)-99.6 F (37.6 C)] 99 F (37.2 C) (06/03 1300) Pulse Rate:  [93-96] 96 (06/03 1300) Resp:  [10-20] 10 (06/03 1300) BP: (105-126)/(50-84) 126/66 mmHg (06/03 1300) SpO2:  [96 %-98 %] 98 % (06/03 1300)  Physical Exam  Constitutional: He is oriented to person, place, and time. He appears well-developed and well-nourished. No distress.  HENT:  Mouth/Throat: Oropharynx is clear and moist. No oropharyngeal exudate.  Cardiovascular: Normal rate, regular rhythm and normal heart sounds. Exam reveals no gallop and no friction rub.  No murmur heard.  Pulmonary/Chest: Effort normal and breath sounds normal. No respiratory distress. He has no wheezes.  Abdominal: Soft. Bowel sounds are normal. He exhibits no distension. There is no tenderness.    Lymphadenopathy:  He has no cervical adenopathy.  Neurological: He is alert and oriented to person, place, and time.  Skin: Skin is warm and dry. No rash noted. No erythema.  Psychiatric: He has a normal mood and affect. His behavior is normal.    Lab Results  Recent Labs  11/06/13 1020 11/08/13 0940  WBC 2.3* 1.0*  HGB 12.3* 11.6*  HCT 34.7* 33.0*  NA 139 134*  K 3.7 3.9  CL 108 107  CO2 20 14*  BUN 5* 11  CREATININE 0.58 1.81*   Liver Panel  Recent Labs  11/06/13 1020 11/08/13 0940  PROT 7.2 5.9*  ALBUMIN 2.7* 2.1*  AST 46* 52*  ALT 22 18  ALKPHOS 115 81  BILITOT 3.4* 1.8*    Microbiology: 6/3 stool studies pending  Studies/Results: Ct Abdomen Pelvis W Contrast  11/07/2013   CLINICAL DATA:  Abdominal pain  EXAM: CT ABDOMEN AND PELVIS WITH CONTRAST  TECHNIQUE: Multidetector CT imaging of the abdomen and pelvis was performed using the standard protocol following bolus administration of intravenous contrast.  CONTRAST:  60mL OMNIPAQUE IOHEXOL 300 MG/ML  SOLN  COMPARISON:  10/22/2013  FINDINGS: Cirrhotic liver.  Portal vein is grossly patent.  Bibasilar atelectasis.  Splenomegaly.  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  Right pleural effusion and abdominal ascites have resolved. Trace amount of fluid is  layering in the pelvis.  Pancreas, kidneys, adrenal glands are within normal limits.  Umbilical hernia containing adipose tissue is stable.  There are areas of wall thickening in the colon. This occurs in the ascending colon on image 53, transverse colon on image 39, and possibly within the sigmoid colon, although the sigmoid colon is decompressed.  Bladder and prostate are unremarkable.  Sclerotic changes within thoracic and lumbar vertebral bodies is not significantly changed. This may be associated with the patient's known history of lymphoma. Lytic areas in the iliac bones are also noted.  IMPRESSION: Cirrhotic liver.  Stable splenomegaly.  There are areas of wall  thickening throughout the colon. Differential diagnosis includes an inflammatory process or malignancy.  Pleural effusions and abdominal ascites have resolved. Trace fluid persist in the pelvis.   Electronically Signed   By: Maryclare Bean M.D.   On: 11/07/2013 16:11     Assessment/Plan: Hiv/aids = continue with truvada/darunavir plus ritonavir oi proph = continue with atovaquone and weekly azithromycin Chronic diarrhea = likely due to hiv, once cdiff ruled out, can give lomotil to see if it helps him N/v = continue with supportive care aki = would increase ivf likely related to fluid loss from diarrhea Pancytopenia = long standing hx since having chemo. He had BM aspirate on 5/21 that did not show infiltrative mac as cause of pancytopenia.  [facility] Cell: [phone] Pager: [phone]  11/08/2013, 5:30 PM

## 2013-11-08 NOTE — Progress Notes (Addendum)
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 He showed improvement in symptoms there an was discharged off abx on 5/22. He then  presented to [facility] ED 11/06/2013 with ongoing fatigue, weakness, diarrhea. He was transferred to [facility] for further care per GI (Dr. Dudley Major in [place]) recommendations as he thought that the pt requires higher level of care.  Pt was started on flagyl for treatment of possible enteritis. HIs stool studies are pending.CT abd done showed wall thickening throughout colon.  Assessment/Plan: Nausea, vomiting, diarrhea and  abdominal pain  - enteritis  possibly related to AIDS given recurrent symptoms for over 3 weeks - repeat stool for C.diff, stool culture, ova and parasites sent. If negative will d/c abx and place on lomotil.  - started on flagyl on admission  - appreciate ID consult and recommendations  - will advance diet full liquids.   Active Problems:  Abdominal pain  - likely due to enteritis , possibly AIDS related -CT abdomen still shows diffuse colonic wall thickening and still has diffuse abdominal tenderness. i will change the dilaudid to 2 mg q4hr prn ( was on 1 mg q3hr prn) continue PPI and sucralfate  HIV/AIDS on HAART  - appreciate ID consult and recommendations  - pt is on ART - also on azithromycin and bactrim for PCP and MAC prophylaxis . Continue mepron  Large cell lymphoma  - Was previously treated with chemotherapy. Reports that he is in  remission. 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 Bone marrow bx done at [facility] recently negative for lymphoma  Pancytopenia  - likely bone marrow suppression from HIV/AIDS and liver cirrhosis . No lymphoma seen on recent bone marrow bx -repeat cbc today - no signs of bleed   Depression  - continue lexapro   Liver cirrhosis  - continue  spironolactone . Resume lasix once BP more stable  DVT prophylaxis: SCD   Code Status: full code Family Communication: none at bedside Disposition Plan: home once improved   Consultants:  ID  DIET: Clears  Procedures:  none  Antibiotics:  IV flagyl (6/1>>)  HPI/Subjective: Patient seen and examined this am. C/o persistent abdominal pain. Reports 2 episodes do diarrhea this am. Had one episode of vomiting last evening.a febrile.   Objective: Filed Vitals:   11/08/13 0626  BP: 105/50  Pulse: 94  Temp: 99.6 F (37.6 C)  Resp: 20    Intake/Output Summary (Last 24 hours) at 11/08/13 0807 Last data filed at 11/08/13 0100  Gross per 24 hour  Intake 1160.83 ml  Output    576 ml  Net 584.83 ml   Filed Weights   11/06/13 0935 11/06/13 1722 11/06/13 2100  Weight: 99.791 kg (220 lb) 99.791 kg (220 lb) 92.352 kg (203 lb 9.6 oz)    Exam:   General:  Middle aged male in some distress with abdominal pain  HEENT: no pallor , moist mucosa   chest: clear b/l, no added sounds  CVS: NS1&S2, no MRG  Abdomen: Soft,diffuse tenderness,  BS+  Musculoskeletal: warm, no  edema  CNS: AAOX3  Data Reviewed: Basic Metabolic Panel:  Recent Labs Lab 11/06/13 1020  NA 139  K 3.7  CL 108  CO2 20  GLUCOSE 85  BUN 5*  CREATININE 0.58  CALCIUM 8.6  MG 1.7   Liver Function Tests:  Recent Labs Lab 11/06/13 1020  AST 46*  ALT 22  ALKPHOS 115  BILITOT 3.4*  PROT 7.2  ALBUMIN 2.7*    Recent Labs Lab 11/06/13 1020  LIPASE 113*   No results found for this basename: AMMONIA,  in the last 168  hours CBC:  Recent Labs Lab 11/06/13 1020  WBC 2.3*  NEUTROABS 1.2*  HGB 12.3*  HCT 34.7*  MCV 102.1*  PLT 68*   Cardiac Enzymes: No results found for this basename: CKTOTAL, CKMB, CKMBINDEX, TROPONINI,  in the last 168 hours BNP (last 3 results) No results found for this basename: PROBNP,  in the last 8760 hours CBG: No results found for this basename: GLUCAP,  in the last 168 hours  No results found for this or any previous visit (from the past 240 hour(s)).   Studies: Ct Abdomen Pelvis W Contrast  11/07/2013   CLINICAL DATA:  Abdominal pain  EXAM: CT ABDOMEN AND PELVIS WITH CONTRAST  TECHNIQUE: Multidetector CT imaging of the abdomen and pelvis was performed using the standard protocol following bolus administration of intravenous contrast.  CONTRAST:  66mL OMNIPAQUE IOHEXOL 300 MG/ML  SOLN  COMPARISON:  10/22/2013  FINDINGS: Cirrhotic liver.  Portal vein is grossly patent.  Bibasilar atelectasis.  Splenomegaly.  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  Right pleural effusion and abdominal ascites have resolved. Trace amount of fluid is layering in the pelvis.  Pancreas, kidneys, adrenal glands are within normal limits.  Umbilical hernia containing adipose tissue is stable.  There are areas of wall thickening in the colon. This occurs in the ascending colon on image 53, transverse colon on image 39, and possibly within the sigmoid colon, although the sigmoid colon is decompressed.  Bladder and prostate are unremarkable.  Sclerotic changes within thoracic and lumbar vertebral bodies is not significantly changed. This may be associated with the patient's known history of lymphoma. Lytic areas in the iliac bones are also noted.  IMPRESSION: Cirrhotic liver.  Stable splenomegaly.  There are areas of wall thickening throughout the colon. Differential diagnosis includes an inflammatory process or malignancy.  Pleural effusions and abdominal ascites have resolved. Trace fluid persist in the  pelvis.   Electronically Signed   By: Maryclare Bean M.D.   On: 11/07/2013 16:11   Dg Abd Acute W/chest  11/06/2013   CLINICAL DATA:  Fever and emesis ; lymphoma  EXAM: ACUTE ABDOMEN SERIES (ABDOMEN 2 VIEW & CHEST 1 VIEW)  COMPARISON:  Chest radiograph Oct 16, 2013; CT abdomen and pelvis Oct 22, 2013  FINDINGS: PA chest: There is no edema or consolidation. Heart size and pulmonary vascularity are normal. No adenopathy. Port-A-Cath tip is at the cavoatrial junction. No pneumothorax.  Supine and upright abdomen: There remains thickening of scattered bowel loops. There are scattered air-fluid levels. There is no well-defined obstruction or free air. There is moderate stool in the colon. There are surgical clips in the right upper quadrant.  IMPRESSION: There remains some wall thickening of several loops of bowel which given the history, may have neoplastic etiology. Scattered air-fluid levels may represent ileus or enteritis. Early obstruction is a consideration, although enteritis or ileus with tend to be more likely given the overall appearance of  the bowel. No free air is seen.  There is no lung edema or consolidation.   Electronically Signed   By: Lowella Grip M.D.   On: 11/06/2013 11:22    Scheduled Meds: . atovaquone  1,500 mg Oral Daily  . [START ON 11/10/2013] azithromycin  1,200 mg Oral Weekly  . Darunavir Ethanolate  800 mg Oral Daily  . emtricitabine-tenofovir  1 tablet Oral QHS  . escitalopram  20 mg Oral Daily  . gabapentin  300 mg Oral TID  . metronidazole  500 mg Intravenous Q8H  . morphine  15 mg Oral BID  . pantoprazole  40 mg Oral BID  . ritonavir  100 mg Oral Q breakfast  . spironolactone  50 mg Oral Daily  . sucralfate  1 g Oral TID WC  . sulfamethoxazole-trimethoprim  1 tablet Oral Daily   Continuous Infusions: . sodium chloride 50 mL/hr at 11/07/13 1156      Time spent: 25 minutes    Daune Colgate  [facility] Pager [phone]. If 7PM-7AM, please contact  night-coverage at [email or web address], password [facility] 11/08/2013, 8:07 AM  LOS: 2 days

## 2013-11-08 NOTE — Progress Notes (Signed)
CRITICAL VALUE ALERT  Critical value received: WBC 1  Date of notification: 11/07/13  Time of notification:  1000  Critical value read back:y  Nurse who received alert:  Z Ora Mcnatt  MD notified (1st page):  Dhungel  Time of first page:  15  MD notified (2nd page):  Time of second page:  Responding MD:  awaiting  Time MD responded:  awaiting

## 2013-11-09 DIAGNOSIS — N179 Acute kidney failure, unspecified: Secondary | ICD-10-CM | POA: Diagnosis not present

## 2013-11-09 LAB — OVA AND PARASITE EXAMINATION: Ova and parasites: NONE SEEN

## 2013-11-09 LAB — CBC
HCT: 30.9 % — ABNORMAL LOW (ref 39.0–52.0)
Hemoglobin: 11 g/dL — ABNORMAL LOW (ref 13.0–17.0)
MCH: 36.5 pg — AB (ref 26.0–34.0)
MCHC: 35.6 g/dL (ref 30.0–36.0)
MCV: 102.7 fL — AB (ref 78.0–100.0)
Platelets: 56 10*3/uL — ABNORMAL LOW (ref 150–400)
RBC: 3.01 MIL/uL — AB (ref 4.22–5.81)
RDW: 17 % — ABNORMAL HIGH (ref 11.5–15.5)
WBC: 3.8 10*3/uL — ABNORMAL LOW (ref 4.0–10.5)

## 2013-11-09 LAB — BASIC METABOLIC PANEL
BUN: 26 mg/dL — ABNORMAL HIGH (ref 6–23)
CALCIUM: 7.8 mg/dL — AB (ref 8.4–10.5)
CO2: 17 meq/L — AB (ref 19–32)
Chloride: 104 mEq/L (ref 96–112)
Creatinine, Ser: 1.86 mg/dL — ABNORMAL HIGH (ref 0.50–1.35)
GFR calc Af Amer: 51 mL/min — ABNORMAL LOW (ref >90)
GFR calc non Af Amer: 44 mL/min — ABNORMAL LOW (ref >90)
GLUCOSE: 99 mg/dL (ref 70–99)
Potassium: 4.3 mEq/L (ref 3.7–5.3)
SODIUM: 132 meq/L — AB (ref 137–147)

## 2013-11-09 LAB — FECAL LACTOFERRIN, QUANT: Fecal Lactoferrin: POSITIVE

## 2013-11-09 MED ORDER — DIPHENOXYLATE-ATROPINE 2.5-0.025 MG PO TABS
2.0000 | ORAL_TABLET | Freq: Three times a day (TID) | ORAL | Status: DC
  Administered 2013-11-09 – 2013-11-24 (×45): 2 via ORAL
  Filled 2013-11-09 (×16): qty 2
  Filled 2013-11-09: qty 1
  Filled 2013-11-09 (×12): qty 2
  Filled 2013-11-09: qty 1
  Filled 2013-11-09 (×16): qty 2

## 2013-11-09 MED ORDER — LOPERAMIDE HCL 2 MG PO CAPS
2.0000 mg | ORAL_CAPSULE | ORAL | Status: DC | PRN
  Administered 2013-11-09: 2 mg via ORAL
  Filled 2013-11-09: qty 1

## 2013-11-09 MED ORDER — DARUNAVIR ETHANOLATE 800 MG PO TABS
800.0000 mg | ORAL_TABLET | Freq: Every day | ORAL | Status: DC
  Administered 2013-11-10 – 2013-11-25 (×16): 800 mg via ORAL
  Filled 2013-11-09 (×18): qty 1

## 2013-11-09 NOTE — Progress Notes (Signed)
[place] for Infectious Disease    Date of Admission:  11/06/2013   Total days of antibiotics 4        Day 4 metronidazole           ID: Samuel Schultz is a 38 y.o. male with advanced hiv/aids, CD 4 count 20/VL 400 on truvada/DRVr who is readmitted for gi symptoms of n//v/diarrhea <5 days after discharge from [facility] for similar admission Principal Problem:   Nausea and vomiting in adult Active Problems:   AIDS   Large cell lymphoma   Cirrhosis, non-alcoholic   Pancytopenia   Enteritis   Other pancytopenia   Acute kidney injury    Subjective: Nausea and vomiting improved but still having diarrhea and fatigue  Medications:  . atovaquone  1,500 mg Oral Daily  . [START ON 11/10/2013] azithromycin  1,200 mg Oral Weekly  . [START ON 11/10/2013] Darunavir Ethanolate  800 mg Oral Q breakfast  . emtricitabine-tenofovir  1 tablet Oral QHS  . escitalopram  20 mg Oral Daily  . gabapentin  300 mg Oral TID  . metronidazole  500 mg Intravenous Q8H  . morphine  15 mg Oral BID  . pantoprazole  40 mg Oral BID  . ritonavir  100 mg Oral Q breakfast  . spironolactone  50 mg Oral Daily  . sucralfate  1 g Oral TID WC    Objective: Vital signs in last 24 hours: Temp:  [98.3 F (36.8 C)-98.7 F (37.1 C)] 98.6 F (37 C) (06/04 1355) Pulse Rate:  [96-99] 98 (06/04 1355) Resp:  [16-18] 18 (06/04 1355) BP: (90-118)/(40-62) 100/48 mmHg (06/04 1355) SpO2:  [93 %-96 %] 93 % (06/04 1355)  Physical Exam  Constitutional: He is oriented to person, place, and time. He appears well-developed and well-nourished. No distress.  HENT:  Mouth/Throat: Oropharynx is clear and moist. No oropharyngeal exudate.  Cardiovascular: Normal rate, regular rhythm and normal heart sounds. Exam reveals no gallop and no friction rub.  No murmur heard.  Pulmonary/Chest: Effort normal and breath sounds normal. No respiratory distress. He has no wheezes.  Abdominal: Soft. Bowel sounds are decreased. He exhibits no  distension.  Tenderness with mild palpation Lymphadenopathy:  He has no cervical adenopathy.  Neurological: He is alert and oriented to person, place, and time.  Skin: Skin is warm and dry. No rash noted. No erythema.  Psychiatric: He has a normal mood and affect. His behavior is normal.    Lab Results  Recent Labs  11/08/13 0940 11/09/13 0540 11/09/13 1125  WBC 1.0* 3.8*  --   HGB 11.6* 11.0*  --   HCT 33.0* 30.9*  --   NA 134*  --  132*  K 3.9  --  4.3  CL 107  --  104  CO2 14*  --  17*  BUN 11  --  26*  CREATININE 1.81*  --  1.86*   Liver Panel  Recent Labs  11/08/13 0940  PROT 5.9*  ALBUMIN 2.1*  AST 52*  ALT 18  ALKPHOS 81  BILITOT 1.8*    Microbiology: 6/3 stool studies pending  Studies/Results: No results found.   Assessment/Plan: Hiv/aids = continue with truvada/darunavir plus ritonavir oi proph = continue with atovaquone and weekly azithromycin Chronic diarrhea = likely due to hiv, once cdiff ruled out, can give lomotil to see if it helps him would do 2mg  TID. Will check cmv as another possible cause for colitis, however recent colonoscopy on 5/21 was not c/w cmv  colitis  N/v = continue with supportive care  aki = would increase ivf likely related to fluid loss from diarrhea  Pancytopenia = long standing hx since having chemo. He had BM aspirate on 5/21 that did not show infiltrative mac as cause of pancytopenia.  [facility] for Infectious Diseases Cell: [phone] Pager: [phone]  11/09/2013, 4:23 PM

## 2013-11-09 NOTE — Progress Notes (Signed)
Pt requested chaplin services. Chaplin visited pt and provided listening ear, support and praryer.   Charyl Dancer, [place]

## 2013-11-09 NOTE — Progress Notes (Signed)
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 He showed improvement in symptoms there an was discharged off abx on 5/22. He then  presented to [facility] ED 11/06/2013 with ongoing fatigue, weakness, diarrhea. He was transferred to [facility] for further care per GI (Dr. Dudley Major in [place]) recommendations as he thought that the pt requires higher level of care.  Pt was started on flagyl for treatment of possible enteritis. HIs stool studies are pending.CT abd done showed wall thickening throughout colon.  Assessment/Plan: Nausea, vomiting, diarrhea and  abdominal pain  - enteritis  possibly related to AIDS given recurrent symptoms for over 3 weeks - repeat stool for C.diff negative, stool culture, ova and parasites pending.placed on lomotil.  - d/c flagyl - appreciate ID consult and recommendations  -  advanced diet full liquids.   Active Problems:  Abdominal pain  - likely due to enteritis , possibly AIDS related -CT abdomen still shows diffuse colonic wall thickening . Tenderness improved today.   Continue  dilaudid to 2 mg q4hr prn  continue PPI and sucralfate  HIV/AIDS on HAART  - appreciate ID consult and recommendations  - pt is on ART - also on azithromycin and bactrim for PCP and MAC prophylaxis . Continue mepron  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 Bone marrow bx done at  [facility] recently negative for lymphoma  Pancytopenia  - likely bone marrow suppression from HIV/AIDS and liver cirrhosis . No lymphoma seen on recent bone marrow bx -pancytopenia unchanged but stable - no signs of bleed   Depression  - continue lexapro   Liver cirrhosis  - continue  spironolactone . Resume lasix once BP more stable  AKI Secondary to dehydration. Increased IV fluids.follow labs today.   DVT prophylaxis: SCD   Code Status: full code Family Communication: none at bedside Disposition Plan: home once improved   Consultants:  ID  DIET: Clears  Procedures:  none  Antibiotics:  IV flagyl (6/1>>)  HPI/Subjective: Patient seen and examined this am. C/o  abdominal pain and 5 episodes of diarrhea overnight   Objective: Filed Vitals:   11/09/13 0550  BP: 103/54  Pulse: 99  Temp: 98.7 F (37.1 C)  Resp: 16    Intake/Output Summary (Last 24 hours) at 11/09/13 1154 Last data filed at 11/09/13 1025  Gross per 24 hour  Intake   1730 ml  Output    741 ml  Net    989 ml   Filed Weights   11/06/13 0935 11/06/13 1722 11/06/13 2100  Weight: 99.791 kg (220 lb) 99.791 kg (220 lb) 92.352 kg (203 lb 9.6 oz)    Exam:   General:  Middle aged male in some distress with abdominal pain  HEENT: no pallor , moist mucosa   chest: clear b/l, no added sounds  CVS: NS1&S2, no MRG  Abdomen: Soft,diffuse tenderness,  BS+  Musculoskeletal: warm, no edema  CNS: AAOX3  Data Reviewed: Basic Metabolic Panel:  Recent Labs Lab 11/06/13 1020 11/08/13 0940  NA 139 134*  K 3.7 3.9  CL 108 107  CO2 20 14*  GLUCOSE 85 160*  BUN 5* 11  CREATININE 0.58 1.81*  CALCIUM 8.6 8.2*  MG 1.7  --    Liver Function Tests:  Recent Labs Lab 11/06/13 1020 11/08/13 0940  AST 46* 52*  ALT 22 18  ALKPHOS 115 81  BILITOT 3.4* 1.8*  PROT 7.2 5.9*  ALBUMIN 2.7* 2.1*    Recent Labs Lab 11/06/13 1020  LIPASE 113*   No results found for this basename:  AMMONIA,  in the last 168 hours CBC:  Recent Labs Lab 11/06/13 1020 11/08/13 0940 11/09/13 0540  WBC 2.3* 1.0* 3.8*  NEUTROABS 1.2*  --   --   HGB 12.3* 11.6* 11.0*  HCT 34.7* 33.0* 30.9*  MCV 102.1* 103.4* 102.7*  PLT 68* 60* 56*   Cardiac Enzymes: No results found for this basename: CKTOTAL, CKMB, CKMBINDEX, TROPONINI,  in the last 168 hours BNP (last 3 results) No results found for this basename: PROBNP,  in the last 8760 hours CBG: No results found for this basename: GLUCAP,  in the last 168 hours  Recent Results (from the past 240 hour(s))  OVA AND PARASITE EXAMINATION     Status: None   Collection Time    11/08/13  2:37 AM      Result Value Ref Range Status   Specimen Description STOOL   Final   Special Requests NONE   Final   Ova and parasites     Final   Value: NO OVA OR PARASITES SEEN     Performed at [facility]   Report Status 11/09/2013 FINAL   Final  CLOSTRIDIUM DIFFICILE BY PCR     Status: None   Collection Time    11/08/13  2:37 AM      Result Value Ref Range Status   C difficile by pcr NEGATIVE  NEGATIVE Final     Studies: Ct Abdomen Pelvis W Contrast  11/07/2013   CLINICAL DATA:  Abdominal pain  EXAM: CT ABDOMEN AND PELVIS WITH CONTRAST  TECHNIQUE: Multidetector CT imaging of the abdomen and pelvis was performed using the standard protocol following bolus administration of intravenous contrast.  CONTRAST:  60mL OMNIPAQUE IOHEXOL 300 MG/ML  SOLN  COMPARISON:  10/22/2013  FINDINGS: Cirrhotic liver.  Portal vein is grossly patent.  Bibasilar atelectasis.  Splenomegaly.  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  Right pleural effusion and abdominal ascites have resolved. Trace amount of fluid is layering in the pelvis.  Pancreas, kidneys, adrenal glands are within normal limits.  Umbilical hernia containing adipose tissue is stable.  There are areas of wall thickening in the colon. This occurs in the ascending colon on image 53, transverse colon on  image 39, and possibly within the sigmoid colon, although the sigmoid colon is decompressed.  Bladder and prostate are unremarkable.  Sclerotic changes within thoracic and lumbar vertebral bodies is not significantly changed. This may be associated with the patient's known history of lymphoma. Lytic areas in the iliac bones are also noted.  IMPRESSION: Cirrhotic liver.  Stable splenomegaly.  There are areas of wall thickening throughout the colon. Differential diagnosis includes an inflammatory process or malignancy.  Pleural effusions and abdominal ascites have resolved. Trace fluid persist in the pelvis.   Electronically Signed   By: Maryclare Bean M.D.   On: 11/07/2013 16:11    Scheduled Meds: . atovaquone  1,500 mg Oral  Daily  . [START ON 11/10/2013] azithromycin  1,200 mg Oral Weekly  . Darunavir Ethanolate  800 mg Oral Daily  . emtricitabine-tenofovir  1 tablet Oral QHS  . escitalopram  20 mg Oral Daily  . gabapentin  300 mg Oral TID  . metronidazole  500 mg Intravenous Q8H  . morphine  15 mg Oral BID  . pantoprazole  40 mg Oral BID  . ritonavir  100 mg Oral Q breakfast  . spironolactone  50 mg Oral Daily  . sucralfate  1 g Oral TID WC   Continuous Infusions: . sodium chloride 100 mL/hr at 11/09/13 1607      Time spent: 25 minutes    Samuel Schultz  [facility] Pager [phone]. If 7PM-7AM, please contact night-coverage at [email or web address], password [facility] 11/09/2013, 11:54 AM  LOS: 3 days

## 2013-11-10 ENCOUNTER — Encounter (HOSPITAL_COMMUNITY): Payer: Self-pay | Admitting: Radiology

## 2013-11-10 ENCOUNTER — Inpatient Hospital Stay (HOSPITAL_COMMUNITY): Payer: Medicaid Other

## 2013-11-10 DIAGNOSIS — A419 Sepsis, unspecified organism: Secondary | ICD-10-CM

## 2013-11-10 DIAGNOSIS — E162 Hypoglycemia, unspecified: Secondary | ICD-10-CM | POA: Diagnosis not present

## 2013-11-10 DIAGNOSIS — I959 Hypotension, unspecified: Secondary | ICD-10-CM | POA: Diagnosis not present

## 2013-11-10 LAB — BASIC METABOLIC PANEL
BUN: 25 mg/dL — AB (ref 6–23)
CO2: 19 meq/L (ref 19–32)
Calcium: 7.9 mg/dL — ABNORMAL LOW (ref 8.4–10.5)
Chloride: 105 mEq/L (ref 96–112)
Creatinine, Ser: 1.22 mg/dL (ref 0.50–1.35)
GFR calc Af Amer: 86 mL/min — ABNORMAL LOW (ref >90)
GFR calc non Af Amer: 74 mL/min — ABNORMAL LOW (ref >90)
GLUCOSE: 84 mg/dL (ref 70–99)
POTASSIUM: 4.3 meq/L (ref 3.7–5.3)
Sodium: 132 mEq/L — ABNORMAL LOW (ref 137–147)

## 2013-11-10 LAB — GLUCOSE, CAPILLARY
GLUCOSE-CAPILLARY: 61 mg/dL — AB (ref 70–99)
Glucose-Capillary: 71 mg/dL (ref 70–99)
Glucose-Capillary: 73 mg/dL (ref 70–99)
Glucose-Capillary: 91 mg/dL (ref 70–99)

## 2013-11-10 LAB — CBC
HCT: 28 % — ABNORMAL LOW (ref 39.0–52.0)
Hemoglobin: 9.8 g/dL — ABNORMAL LOW (ref 13.0–17.0)
MCH: 36.2 pg — ABNORMAL HIGH (ref 26.0–34.0)
MCHC: 35 g/dL (ref 30.0–36.0)
MCV: 103.3 fL — ABNORMAL HIGH (ref 78.0–100.0)
Platelets: 45 10*3/uL — ABNORMAL LOW (ref 150–400)
RBC: 2.71 MIL/uL — ABNORMAL LOW (ref 4.22–5.81)
RDW: 17.7 % — AB (ref 11.5–15.5)
WBC: 2.6 10*3/uL — AB (ref 4.0–10.5)

## 2013-11-10 LAB — LACTIC ACID, PLASMA: LACTIC ACID, VENOUS: 5.4 mmol/L — AB (ref 0.5–2.2)

## 2013-11-10 MED ORDER — IOHEXOL 300 MG/ML  SOLN
25.0000 mL | INTRAMUSCULAR | Status: AC
  Administered 2013-11-10 (×2): 25 mL via ORAL

## 2013-11-10 MED ORDER — PIPERACILLIN-TAZOBACTAM 3.375 G IVPB
3.3750 g | Freq: Three times a day (TID) | INTRAVENOUS | Status: DC
  Administered 2013-11-10 – 2013-11-11 (×3): 3.375 g via INTRAVENOUS
  Filled 2013-11-10 (×5): qty 50

## 2013-11-10 MED ORDER — IOHEXOL 300 MG/ML  SOLN
100.0000 mL | Freq: Once | INTRAMUSCULAR | Status: AC | PRN
  Administered 2013-11-10: 100 mL via INTRAVENOUS

## 2013-11-10 MED ORDER — HYDROMORPHONE HCL PF 1 MG/ML IJ SOLN
2.0000 mg | Freq: Once | INTRAMUSCULAR | Status: AC
  Administered 2013-11-10: 2 mg via INTRAVENOUS

## 2013-11-10 MED ORDER — SODIUM CHLORIDE 0.9 % IV BOLUS (SEPSIS)
1000.0000 mL | Freq: Once | INTRAVENOUS | Status: AC
  Administered 2013-11-10: 1000 mL via INTRAVENOUS

## 2013-11-10 NOTE — Progress Notes (Signed)
Hypoglycemic Event  CBG: 61  Treatment: 15 GM carbohydrate snack  Symptoms: Pale, Sweaty and Shaky  Follow-up CBG: [id number] CBG Result: 71  Second Follow-up CBG: Time: 0930    CBG: 73  Possible Reasons for Event: Inadequate meal intake  Comments/MD notified:Dr. Dhungel in room, aware of event    Micki Riley  Remember to initiate Hypoglycemia Order Set & complete

## 2013-11-10 NOTE — Progress Notes (Signed)
[place] for Infectious Disease    Date of Admission:  11/06/2013   Total days of antibiotics 4        Day 4 metronidazole           ID: Samuel Schultz is a 38 y.o. male with advanced hiv/aids, CD 4 count 20/VL 400 on truvada/DRVr who is readmitted for gi symptoms of n//v/diarrhea <5 days after discharge from [facility] for similar admission Principal Problem:   Nausea and vomiting in adult Active Problems:   AIDS   Large cell lymphoma   Cirrhosis, non-alcoholic   Pancytopenia   Enteritis   Other pancytopenia   Acute kidney injury   Hypotension, unspecified   Sepsis   Hypoglycemia    Subjective: Episode of hypoglycemia this morning  And later this afternoon hypotensive with sBP 70s and given 1L bolus with mild improvement. He has only had 2 bm today. Had good oral intake prior today.  Still feels poorly, and ongoing abdominal pain.   Work up thus far shows LA 5.4, ekg showed slightly prolonged QTC  Medications:  . atovaquone  1,500 mg Oral Daily  . azithromycin  1,200 mg Oral Weekly  . Darunavir Ethanolate  800 mg Oral Q breakfast  . diphenoxylate-atropine  2 tablet Oral TID  . emtricitabine-tenofovir  1 tablet Oral QHS  . escitalopram  20 mg Oral Daily  . morphine  15 mg Oral BID  . pantoprazole  40 mg Oral BID  . ritonavir  100 mg Oral Q breakfast  . sucralfate  1 g Oral TID WC    Objective: Vital signs in last 24 hours: Temp:  [98.2 F (36.8 C)-99.5 F (37.5 C)] 98.2 F (36.8 C) (06/05 1335) Pulse Rate:  [102-122] 114 (06/05 1335) Resp:  [19-20] 20 (06/05 1335) BP: (78-132)/(47-62) 92/62 mmHg (06/05 1745) SpO2:  [92 %-94 %] 92 % (06/05 1335)  Physical Exam  Constitutional: He is oriented to person, place, and time. Looks chronically ill, in mild distress  HENT:  Mouth/Throat: Oropharynx is clear and moist. No oropharyngeal exudate.  Cardiovascular: tachy, regular rhythm and normal heart sounds. Exam reveals no gallop and no friction rub.  No murmur  heard.  Pulmonary/Chest: Effort normal and breath sounds normal. No respiratory distress. He has no wheezes.  Abdominal: Soft. Bowel sounds are decreased. He exhibits no distension.  Tenderness with mild palpation  Skin: Skin is warm and dry. No rash noted. No erythema.     Lab Results  Recent Labs  11/09/13 0540 11/09/13 1125 11/10/13 0505 11/10/13 1635  WBC 3.8*  --   --  2.6*  HGB 11.0*  --   --  9.8*  HCT 30.9*  --   --  28.0*  NA  --  132* 132*  --   K  --  4.3 4.3  --   CL  --  104 105  --   CO2  --  17* 19  --   BUN  --  26* 25*  --   CREATININE  --  1.86* 1.22  --    Liver Panel  Recent Labs  11/08/13 0940  PROT 5.9*  ALBUMIN 2.1*  AST 52*  ALT 18  ALKPHOS 81  BILITOT 1.8*    Microbiology: 6/3 stool studies pending  Studies/Results: No results found.   Assessment/Plan: Hypotension = unclear if it is related to developing infection, would recommend to place on piptazo or cefepime plus metronidazole for enteric coverage. Agree with checking for adrenal  insufficiency as well as repeating abdominal CT  Hiv/aids = continue with truvada/darunavir plus ritonavir oi proph = continue with atovaquone and weekly azithromycin  Chronic diarrhea = lomotil appears improving. Diarrhea thought to be due to hiv. 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 He had BM aspirate on 5/21 that did not show infiltrative mac as cause of pancytopenia.  Dr. Tommy Medal to provide further recs over the [place] [facility] Infect[facility] Cell: [phone] Pager: [phone]  11/10/2013, 6:45 PM

## 2013-11-10 NOTE — Progress Notes (Signed)
ANTIBIOTIC CONSULT NOTE - INITIAL  Pharmacy Consult for zosyn Indication: intraabdominal infection  Allergies  Allergen Reactions  . Codeine Anaphylaxis    Patient Measurements: Height: 5\' 9"  (175.3 cm) Weight: 203 lb 9.6 oz (92.352 kg) IBW/kg (Calculated) : 70.7  Vital Signs: Temp: 98.5 F (36.9 C) (06/05 1844) Temp src: Oral (06/05 1844) BP: 90/58 mmHg (06/05 1844) Pulse Rate: 99 (06/05 1844) Intake/Output from previous day: 06/04 0701 - 06/05 0700 In: 2520 [P.O.:720; I.V.:1700; IV Piggyback:100] Out: 750 [Urine:750] Intake/Output from this shift:    Labs:  Recent Labs  11/08/13 0940 11/09/13 0540 11/09/13 1125 11/10/13 0505 11/10/13 1635  WBC 1.0* 3.8*  --   --  2.6*  HGB 11.6* 11.0*  --   --  9.8*  PLT 60* 56*  --   --  45*  CREATININE 1.81*  --  1.86* 1.22  --    Estimated Creatinine Clearance: 92.2 ml/min (by C-G formula based on Cr of 1.22). No results found for this basename: VANCOTROUGH, Corlis Leak, VANCORANDOM, Gordon, GENTPEAK, GENTRANDOM, TOBRATROUGH, TOBRAPEAK, TOBRARND, AMIKACINPEAK, AMIKACINTROU, AMIKACIN,  in the last 72 hours   Microbiology: Recent Results (from the past 720 hour(s))  CLOSTRIDIUM DIFFICILE BY PCR     Status: None   Collection Time    10/16/13  3:48 PM      Result Value Ref Range Status   C difficile by pcr NEGATIVE  NEGATIVE Final  STOOL CULTURE     Status: None   Collection Time    10/16/13  3:48 PM      Result Value Ref Range Status   Specimen Description STOOL   Final   Special Requests IMMUNE:COMPROMISED   Final   Culture     Final   Value: NO SALMONELLA, SHIGELLA, CAMPYLOBACTER, YERSINIA, OR E.COLI 0157:H7 ISOLATED     Performed at [facility]   Report Status 10/20/2013 FINAL   Final  OVA AND PARASITE EXAMINATION     Status: None   Collection Time    10/16/13  3:48 PM      Result Value Ref Range Status   Specimen Description STOOL   Final   Special Requests IMMUNE:COMPROMISED   Final   Ova and  parasites     Final   Value: NO OVA OR PARASITES SEEN     Performed at [facility]   Report Status 10/17/2013 FINAL   Final  CRYPTOSPORIDIUM SMEAR, FECAL     Status: None   Collection Time    10/16/13  3:48 PM      Result Value Ref Range Status   Specimen Description STOOL   Final   Special Requests NONE   Final   Cryptosporidium Smear.     Final   Value: NO Cryptosporidium Cyclospora or Isospora seen.     Performed at [facility]   Report Status 10/17/2013 FINAL   Final  CULTURE, BLOOD (ROUTINE X 2)     Status: None   Collection Time    10/16/13  6:06 PM      Result Value Ref Range Status   Specimen Description BLOOD RIGHT ANTECUBITAL   Final   Special Requests     Final   Value: BOTTLES DRAWN AEROBIC AND ANAEROBIC 8CC IMMUNE:COMPROMISED   Culture NO GROWTH 5 DAYS   Final   Report Status 10/21/2013 FINAL   Final  CULTURE, BLOOD (ROUTINE X 2)     Status: None   Collection Time    10/16/13  6:11 PM  Result Value Ref Range Status   Specimen Description BLOOD LEFT ANTECUBITAL   Final   Special Requests     Final   Value: BOTTLES DRAWN AEROBIC AND ANAEROBIC 8CC IMMUNE:COMPROMISED   Culture NO GROWTH 5 DAYS   Final   Report Status 10/21/2013 FINAL   Final  STOOL CULTURE     Status: None   Collection Time    11/08/13  2:37 AM      Result Value Ref Range Status   Specimen Description STOOL   Final   Special Requests NONE   Final   Culture     Final   Value: Culture reincubated for better growth     Performed at [facility]   Report Status PENDING   Incomplete  OVA AND PARASITE EXAMINATION     Status: None   Collection Time    11/08/13  2:37 AM      Result Value Ref Range Status   Specimen Description STOOL   Final   Special Requests NONE   Final   Ova and parasites     Final   Value: NO OVA OR PARASITES SEEN     Performed at [facility]   Report Status 11/09/2013 FINAL   Final  CLOSTRIDIUM DIFFICILE BY PCR     Status: None    Collection Time    11/08/13  2:37 AM      Result Value Ref Range Status   C difficile by pcr NEGATIVE  NEGATIVE Final    Medical History: Past Medical History  Diagnosis Date  . Cancer   . Lymphoma   . HIV disease   . Cirrhosis   . PTSD (post-traumatic stress disorder)   . Depressive disorder     Medications:  Prescriptions prior to admission  Medication Sig Dispense Refill  . atovaquone (MEPRON) 750 MG/5ML suspension Take 1,500 mg by mouth daily.      . Darunavir Ethanolate (PREZISTA) 800 MG tablet Take 800 mg by mouth daily.      Marland Kitchen docusate sodium (STOOL SOFTENER) 100 MG capsule Take 100 mg by mouth 2 (two) times daily.      Marland Kitchen emtricitabine-tenofovir (TRUVADA) 200-300 MG per tablet Take 1 tablet by mouth at bedtime.      Marland Kitchen escitalopram (LEXAPRO) 20 MG tablet Take 20 mg by mouth daily.      . furosemide (LASIX) 20 MG tablet Take 20 mg by mouth daily.      Marland Kitchen gabapentin (NEURONTIN) 300 MG capsule Take 300 mg by mouth 3 (three) times daily.      Marland Kitchen morphine (MS CONTIN) 15 MG 12 hr tablet Take 15 mg by mouth 2 (two) times daily.      . pantoprazole (PROTONIX) 40 MG tablet Take 40 mg by mouth 2 (two) times daily.      . polyethylene glycol (MIRALAX / GLYCOLAX) packet Take 17 g by mouth daily.      . prazosin (MINIPRESS) 1 MG capsule Take 3 mg by mouth at bedtime.      . ritonavir (NORVIR) 100 MG capsule Take 100 mg by mouth daily with breakfast.      . senna (SENOKOT) 8.6 MG tablet Take 2 tablets by mouth 2 (two) times daily.       Marland Kitchen spironolactone (ALDACTONE) 50 MG tablet Take 50 mg by mouth daily.      . sucralfate (CARAFATE) 1 G tablet Take 1 g by mouth 3 (three) times daily with meals.       Marland Kitchen  azithromycin (ZITHROMAX) 600 MG tablet Take 1,200 mg by mouth every 7 (seven) days.       Assessment: 38 yo man with advanced HIV/AIDs readmitted for gi symptoms <5 days after dc from [facility].  His SrCr is 1.22.    Goal of Therapy:  Eradication of infection  Plan:  Zosyn 3.375 gm IV  q8 hours F/u clinical course and cultures.  Adabella Stanis Poteet Anton Cheramie 11/10/2013,9:33 PM

## 2013-11-10 NOTE — Progress Notes (Signed)
Patient had hypotensive episode this afternoon with BP dropping to 78/48 manually.  Still diaphoretic with complaint of abdominal pain and dizziness upon standing. CBG was WNL at 91.  Dr. Clementeen Graham notified and new orders received.  BP improved some post bolus, but patient still with significant weakness.  Dr. Clementeen Graham aware of patient's status and recent BP's, additional workup in progress.

## 2013-11-10 NOTE — Progress Notes (Addendum)
[facility] PROGRESS NOTE  Samuel Schultz VHQ:[id number] DOB: 29-Jun-1975 DOA: 11/06/2013 PCP: PROVIDER NOT IN SYSTEM   Brief narrative 38 year old male with past medical history of HIV/AIDS on HAART (last CD4 10/18/2013 20), large cell lymphoma with liver infiltration (treawted with [facility] in 2010-2012 (followed at [facility]), recent admission for colitis (transferred to [facility] on 10/22/2013) where he had full w/up including negative stool studies, cx , negative colonoscopy and a bone marrow bx showing hypercellular marrow without finidings of lymphoma or leukemia. He showed improvement in symptoms there an was discharged off abx on 5/22. He then  presented to [facility] ED 11/06/2013 with ongoing fatigue, weakness, diarrhea. He was transferred to [facility] for further care per GI (Dr. Dudley Major in [facility]) recommendations as he thought that the pt requires higher level of care.  Pt was started on flagyl for treatment of possible enteritis. HIs stool studies are pending.CT abd done showed wall thickening throughout colon.  Assessment/Plan: Nausea, vomiting, diarrhea and  abdominal pain  - enteritis  possibly related to AIDS given recurrent symptoms for over 3 weeks - repeat stool for C.diff negative, stool culture, ova and parasites pending.placed on lomotil.  - d/c flagyl - appreciate ID consult and recommendations  -  advanced diet full liquids.   Active Problems:   Abdominal pain  - likely due to enteritis , possibly AIDS related -CT abdomen still shows diffuse colonic wall thickening . Tenderness improved today.   Continue  dilaudid to 2 mg q4hr prn  continue PPI and sucralfate  Hypotension and hypoglycemia  patient diaphoretic this am with fsg in low 60s. Given po juice with some improvement. Became hypotensive this afternoon and c/o abdominal pain. BMET wnl. Check cbc, lactic acid and blood cx. EKG shows sinus tachy at 105 with Qtc of 520 ( on weekly azithromycin which he received today) Check random cortisol.  Hold neurontin. Will repeat CT abdomen and pelvis Place on telemetry Ordered 1 L normal saline bolus. Will give maintenance fluids  HIV/AIDS on HAART  - appreciate ID consult and recommendations  - pt is on ART - also on azithromycin and bactrim for PCP and MAC prophylaxis .monitor Qtc.  Continue mepron  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 Bone marrow bx done at [facility] recently negative for lymphoma  Pancytopenia  - likely bone marrow suppression from HIV/AIDS and liver cirrhosis . No lymphoma seen on recent bone marrow bx -pancytopenia unchanged but stable - no signs of bleed   Depression  - continue lexapro   Liver cirrhosis  - continue  spironolactone . Resume lasix once BP more stable   AKI Secondary to dehydration. Improved in am labs   DVT prophylaxis: SCD   Code Status: full code Family Communication: none at bedside Disposition Plan: home once improved   Consultants:  ID  DIET: full liquids  Procedures:  none  Antibiotics:  IV flagyl (6/1>>6/5)  HPI/Subjective: Patient seen and examined this am. Still having diarrhea. Became hypoglycemic this am and diaphoretic. Patient noted to have low BP this afternoon. C/o feeling fatigued . Denies headache, dizziness, chest pain or shortness of breath. No N/V  Objective: Filed Vitals:   11/10/13 1500  BP: 78/48  Pulse:   Temp:   Resp:     Intake/Output Summary (Last 24 hours) at 11/10/13 1558 Last data filed at 11/10/13 1335  Gross per 24 hour  Intake  1240 ml  Output    900 ml  Net    340 ml   Filed Weights   11/06/13 0935 11/06/13 1722 11/06/13 2100  Weight: 99.791 kg (220 lb) 99.791 kg (220 lb) 92.352 kg (203 lb 9.6 oz)    Exam:   General:  Middle aged male appears fatigued  HEENT: no pallor , moist mucosa   chest: clear b/l, no added sounds  CVS: NS1&S2, no  MRG  Abdomen: Soft,diffuse tenderness,  BS+  Musculoskeletal: warm, trace edema  CNS: AAOX3  Data Reviewed: Basic Metabolic Panel:  Recent Labs Lab 11/06/13 1020 11/08/13 0940 11/09/13 1125 11/10/13 0505  NA 139 134* 132* 132*  K 3.7 3.9 4.3 4.3  CL 108 107 104 105  CO2 20 14* 17* 19  GLUCOSE 85 160* 99 84  BUN 5* 11 26* 25*  CREATININE 0.58 1.81* 1.86* 1.22  CALCIUM 8.6 8.2* 7.8* 7.9*  MG 1.7  --   --   --    Liver Function Tests:  Recent Labs Lab 11/06/13 1020 11/08/13 0940  AST 46* 52*  ALT 22 18  ALKPHOS 115 81  BILITOT 3.4* 1.8*  PROT 7.2 5.9*  ALBUMIN 2.7* 2.1*    Recent Labs Lab 11/06/13 1020  LIPASE 113*   No results found for this basename: AMMONIA,  in the last 168 hours CBC:  Recent Labs Lab 11/06/13 1020 11/08/13 0940 11/09/13 0540  WBC 2.3* 1.0* 3.8*  NEUTROABS 1.2*  --   --   HGB 12.3* 11.6* 11.0*  HCT 34.7* 33.0* 30.9*  MCV 102.1* 103.4* 102.7*  PLT 68* 60* 56*   Cardiac Enzymes: No results found for this basename: CKTOTAL, CKMB, CKMBINDEX, TROPONINI,  in the last 168 hours BNP (last 3 results) No results found for this basename: PROBNP,  in the last 8760 hours CBG: No results found for this basename: GLUCAP,  in the last 168 hours  Recent Results (from the past 240 hour(s))  STOOL CULTURE     Status: None   Collection Time    11/08/13  2:37 AM      Result Value Ref Range Status   Specimen Description STOOL   Final   Special Requests NONE   Final   Culture     Final   Value: Culture reincubated for better growth     Performed at [facility]   Report Status PENDING   Incomplete  OVA AND PARASITE EXAMINATION     Status: None   Collection Time    11/08/13  2:37 AM      Result Value Ref Range Status   Specimen Description STOOL   Final   Special Requests NONE   Final   Ova and parasites     Final   Value: NO OVA OR PARASITES SEEN     Performed at [facility]   Report Status 11/09/2013 FINAL   Final   CLOSTRIDIUM DIFFICILE BY PCR     Status: None   Collection Time    11/08/13  2:37 AM      Result Value Ref Range Status   C difficile by pcr NEGATIVE  NEGATIVE Final     Studies: No results found.  Scheduled Meds: . atovaquone  1,500 mg Oral Daily  . azithromycin  1,200 mg Oral Weekly  . Darunavir Ethanolate  800 mg Oral Q breakfast  . diphenoxylate-atropine  2 tablet Oral TID  . emtricitabine-tenofovir  1 tablet Oral QHS  . escitalopram  20  mg Oral Daily  . gabapentin  300 mg Oral TID  . morphine  15 mg Oral BID  . pantoprazole  40 mg Oral BID  . ritonavir  100 mg Oral Q breakfast  . sodium chloride  1,000 mL Intravenous Once  . spironolactone  50 mg Oral Daily  . sucralfate  1 g Oral TID WC   Continuous Infusions: . sodium chloride 100 mL/hr at 11/09/13 2036      Time spent: 35 minutes    Samuel Schultz  [facility] Pager [phone]. If 7PM-7AM, please contact night-coverage at [email or web address], password [facility] 11/10/2013, 3:58 PM  LOS: 4 days

## 2013-11-11 DIAGNOSIS — K5289 Other specified noninfective gastroenteritis and colitis: Secondary | ICD-10-CM

## 2013-11-11 DIAGNOSIS — A419 Sepsis, unspecified organism: Secondary | ICD-10-CM

## 2013-11-11 LAB — CLOSTRIDIUM DIFFICILE BY PCR: Toxigenic C. Difficile by PCR: NEGATIVE

## 2013-11-11 LAB — BASIC METABOLIC PANEL
BUN: 34 mg/dL — ABNORMAL HIGH (ref 6–23)
CALCIUM: 7.9 mg/dL — AB (ref 8.4–10.5)
CHLORIDE: 104 meq/L (ref 96–112)
CO2: 18 meq/L — AB (ref 19–32)
Creatinine, Ser: 1.47 mg/dL — ABNORMAL HIGH (ref 0.50–1.35)
GFR calc Af Amer: 68 mL/min — ABNORMAL LOW (ref >90)
GFR calc non Af Amer: 59 mL/min — ABNORMAL LOW (ref >90)
Glucose, Bld: 64 mg/dL — ABNORMAL LOW (ref 70–99)
Potassium: 4.6 mEq/L (ref 3.7–5.3)
Sodium: 132 mEq/L — ABNORMAL LOW (ref 137–147)

## 2013-11-11 LAB — CMV ANTIBODY, IGG (EIA): CMV Ab - IgG: >10 U/mL — ABNORMAL HIGH (ref <0.60)

## 2013-11-11 LAB — LACTIC ACID, PLASMA: LACTIC ACID, VENOUS: 1.8 mmol/L (ref 0.5–2.2)

## 2013-11-11 LAB — CBC
HCT: 26.7 % — ABNORMAL LOW (ref 39.0–52.0)
Hemoglobin: 9.4 g/dL — ABNORMAL LOW (ref 13.0–17.0)
MCH: 36.2 pg — AB (ref 26.0–34.0)
MCHC: 35.2 g/dL (ref 30.0–36.0)
MCV: 102.7 fL — ABNORMAL HIGH (ref 78.0–100.0)
PLATELETS: 30 10*3/uL — AB (ref 150–400)
RBC: 2.6 MIL/uL — AB (ref 4.22–5.81)
RDW: 18 % — ABNORMAL HIGH (ref 11.5–15.5)
WBC: 2.8 10*3/uL — ABNORMAL LOW (ref 4.0–10.5)

## 2013-11-11 LAB — CORTISOL: Cortisol, Plasma: 11.5 ug/dL

## 2013-11-11 LAB — PROTIME-INR
INR: 2.61 — AB (ref 0.00–1.49)
Prothrombin Time: 27 seconds — ABNORMAL HIGH (ref 11.6–15.2)

## 2013-11-11 MED ORDER — HYOSCYAMINE SULFATE 0.125 MG SL SUBL
0.1250 mg | SUBLINGUAL_TABLET | SUBLINGUAL | Status: DC | PRN
  Filled 2013-11-11 (×2): qty 1

## 2013-11-11 MED ORDER — METRONIDAZOLE IN NACL 5-0.79 MG/ML-% IV SOLN
500.0000 mg | Freq: Three times a day (TID) | INTRAVENOUS | Status: DC
  Administered 2013-11-11: 500 mg via INTRAVENOUS
  Filled 2013-11-11 (×2): qty 100

## 2013-11-11 MED ORDER — DEXTROSE-NACL 5-0.9 % IV SOLN
INTRAVENOUS | Status: DC
  Administered 2013-11-11 – 2013-11-17 (×7): via INTRAVENOUS

## 2013-11-11 MED ORDER — METRONIDAZOLE 500 MG PO TABS
500.0000 mg | ORAL_TABLET | Freq: Three times a day (TID) | ORAL | Status: DC
  Administered 2013-11-11 – 2013-11-17 (×16): 500 mg via ORAL
  Filled 2013-11-11 (×28): qty 1

## 2013-11-11 MED ORDER — FLUCONAZOLE 100MG IVPB
100.0000 mg | INTRAVENOUS | Status: DC
  Administered 2013-11-11 – 2013-11-12 (×2): 100 mg via INTRAVENOUS
  Filled 2013-11-11 (×4): qty 50

## 2013-11-11 MED ORDER — DEXTROSE 5 % IV SOLN
2.0000 g | INTRAVENOUS | Status: DC
  Administered 2013-11-11 – 2013-11-12 (×2): 2 g via INTRAVENOUS
  Filled 2013-11-11 (×4): qty 2

## 2013-11-11 MED ORDER — VANCOMYCIN HCL IN DEXTROSE 1-5 GM/200ML-% IV SOLN
1000.0000 mg | Freq: Two times a day (BID) | INTRAVENOUS | Status: DC
  Administered 2013-11-11 – 2013-11-14 (×6): 1000 mg via INTRAVENOUS
  Filled 2013-11-11 (×7): qty 200

## 2013-11-11 NOTE — Progress Notes (Signed)
Second blood culture Positive called from [facility].

## 2013-11-11 NOTE — Progress Notes (Signed)
[facility] PROGRESS NOTE  Torri Langston VHQ:[id number] DOB: [birth year] DOA: 11/06/2013 PCP: PROVIDER NOT IN SYSTEM   Brief narrative 38 year old male with past medical history of HIV/AIDS on HAART (last CD4 10/18/2013 20), large cell lymphoma with liver infiltration (treawted with R-[facility] in 2010-2012 (followed at [facility]), recent admission for colitis (transferred to [facility] on 10/22/2013) where he had full w/up including negative stool studies, cx , negative colonoscopy and a bone marrow bx showing hypercellular marrow without finidings of lymphoma or leukemia. He showed improvement in symptoms there an was discharged off abx on 5/22. He then  presented to [facility] ED 11/06/2013 with ongoing fatigue, weakness, diarrhea. He was transferred to [facility] for further care per GI (Dr. Dudley Major in [facility]) recommendations as he thought that the pt requires higher level of care.  Pt was started on flagyl for treatment of possible enteritis. HIs stool studies are pending.CT abd done showed wall thickening throughout colon.  Assessment/Plan: Nausea, vomiting, diarrhea and  abdominal pain  - enteritis  possibly related to AIDS given recurrent symptoms for over 3 weeks - repeat stool for C.diff negative, stool culture, ova and parasites negative. Symptoms not improved on Lomotil. -Flagyl discontinued on 6/5 but was restarted even worsened colitis. Added to Zosyn. Lactic acid elevated with worsened colitis on CT scan from 6/5. -Followup blood culture from 6/5. -IV hydration and clear liquid diet. GI consulted. May need repeat colonoscopy and bx if symptoms not improved.  Active Problems:  Hypotension and hypoglycemia since 6/5 -Patient given IV normal saline bolus and D5 normal saline. Check random cortisol. Hold neurontin. Repeat abdominal CT shows extensive colonic wall thickening involving cecum and ascending colon. Monitor on telemetry  HIV/AIDS on HAART  - appreciate ID consult and recommendations  - pt is on ART . CD4  count of 20 - also on azithromycin and bactrim for PCP and MAC prophylaxis .monitor Qtc.  Continue mepron  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 Bone marrow bx done at [facility] recently negative for lymphoma  Pancytopenia  - likely bone marrow suppression from HIV/AIDS and liver cirrhosis . No lymphoma seen on recent bone marrow bx -Worsened platelets this morning with elevated INR. No signs of bleeding. Monitor closely.   Depression  -Hold Lexapro  Liver cirrhosis  Hold Aldactone and Lasix.  AKI Secondary to dehydration. Monitor in am labs   DVT prophylaxis: SCD   Code Status: full code Family Communication: none at bedside Disposition Plan: home once improved   Consultants:  ID  DIET: full liquids  Procedures:  none  Antibiotics:  IV flagyl (6/1>>  IV zoeyn 6/5>>  HPI/Subjective: Patient still has persistent diarrhea and abdominal pain . Blood pressure low normal. Repeated CT scan of the abdomen shows worsening colitis  Objective: Filed Vitals:   11/11/13 0623  BP: 98/52  Pulse:   Temp:   Resp:     Intake/Output Summary (Last 24 hours) at 11/11/13 1353 Last data filed at 11/11/13 0933  Gross per 24 hour  Intake   5145 ml  Output    600 ml  Net   4545 ml   Filed Weights   11/06/13 0935 11/06/13 1722 11/06/13 2100  Weight: 99.791 kg (220 lb) 99.791 kg (220 lb) 92.352 kg (203 lb 9.6 oz)    Exam:   General:  Middle aged male appears fatigued  HEENT: no pallor , dry oral mucosa  chest: clear b/l, no added sounds, rt sided port A cath  CVS: S1 and S2 tachycardic, no murmurs rub or gallop  Abdomen: Soft,diffuse tenderness,  BS+  Musculoskeletal: warm, 1+ edema  CNS: AAOX3  Data Reviewed: Basic Metabolic Panel:  Recent Labs Lab 11/06/13 1020 11/08/13 0940 11/09/13 1125 11/10/13 0505 11/11/13 0510  NA 139 134* 132*  132* 132*  K 3.7 3.9 4.3 4.3 4.6  CL 108 107 104 105 104  CO2 20 14* 17* 19 18*  GLUCOSE 85 160* 99 84 64*  BUN 5* 11 26* 25* 34*  CREATININE 0.58 1.81* 1.86* 1.22 1.47*  CALCIUM 8.6 8.2* 7.8* 7.9* 7.9*  MG 1.7  --   --   --   --    Liver Function Tests:  Recent Labs Lab 11/06/13 1020 11/08/13 0940  AST 46* 52*  ALT 22 18  ALKPHOS 115 81  BILITOT 3.4* 1.8*  PROT 7.2 5.9*  ALBUMIN 2.7* 2.1*    Recent Labs Lab 11/06/13 1020  LIPASE 113*   No results found for this basename: AMMONIA,  in the last 168 hours CBC:  Recent Labs Lab 11/06/13 1020 11/08/13 0940 11/09/13 0540 11/10/13 1635 11/11/13 0510  WBC 2.3* 1.0* 3.8* 2.6* 2.8*  NEUTROABS 1.2*  --   --   --   --   HGB 12.3* 11.6* 11.0* 9.8* 9.4*  HCT 34.7* 33.0* 30.9* 28.0* 26.7*  MCV 102.1* 103.4* 102.7* 103.3* 102.7*  PLT 68* 60* 56* 45* 30*   Cardiac Enzymes: No results found for this basename: CKTOTAL, CKMB, CKMBINDEX, TROPONINI,  in the last 168 hours BNP (last 3 results) No results found for this basename: PROBNP,  in the last 8760 hours CBG:  Recent Labs Lab 11/10/13 0853 11/10/13 0917 11/10/13 0928 11/10/13 1505  GLUCAP 61* 71 73 91    Recent Results (from the past 240 hour(s))  STOOL CULTURE     Status: None   Collection Time    11/08/13  2:37 AM      Result Value Ref Range Status   Specimen Description STOOL   Final   Special Requests NONE   Final   Culture     Final   Value: Culture reincubated for better growth     Performed at [facility]   Report Status PENDING   Incomplete  OVA AND PARASITE EXAMINATION     Status: None   Collection Time    11/08/13  2:37 AM      Result Value Ref Range Status   Specimen Description STOOL   Final   Special Requests NONE   Final   Ova and parasites     Final   Value: NO OVA OR PARASITES SEEN     Performed at [facility]   Report Status 11/09/2013 FINAL   Final  CLOSTRIDIUM DIFFICILE BY PCR     Status: None   Collection Time     11/08/13  2:37 AM      Result Value Ref Range Status   C difficile by pcr NEGATIVE  NEGATIVE Final  CULTURE, BLOOD (ROUTINE X 2)     Status: None   Collection Time    11/10/13  4:35 PM      Result Value Ref Range Status   Specimen Description BLOOD LEFT HAND   Final   Special Requests BOTTLES DRAWN AEROBIC ONLY 3CC   Final   Culture  Setup Time     Final   Value: 11/10/2013 22:39  Performed at [facility]     Final   Value:        BLOOD CULTURE RECEIVED NO GROWTH TO DATE CULTURE WILL BE HELD FOR 5 DAYS BEFORE ISSUING A FINAL NEGATIVE REPORT     Performed at [facility]   Report Status PENDING   Incomplete  CULTURE, BLOOD (ROUTINE X 2)     Status: None   Collection Time    11/10/13  4:50 PM      Result Value Ref Range Status   Specimen Description BLOOD LEFT ANTECUBITAL   Final   Special Requests BOTTLES DRAWN AEROBIC ONLY 10CC   Final   Culture  Setup Time     Final   Value: 11/10/2013 22:39     Performed at [facility]   Culture     Final   Value:        BLOOD CULTURE RECEIVED NO GROWTH TO DATE CULTURE WILL BE HELD FOR 5 DAYS BEFORE ISSUING A FINAL NEGATIVE REPORT     Performed at [facility]   Report Status PENDING   Incomplete     Studies: Ct Head Wo Contrast  11/10/2013   CLINICAL DATA:  Persistent abdominal pain.  Dizziness.  Headache.  EXAM: CT HEAD WITHOUT CONTRAST  TECHNIQUE: Contiguous axial images were obtained from the base of the skull through the vertex without intravenous contrast.  COMPARISON:  No priors.  FINDINGS: No acute intracranial abnormalities. Specifically, no evidence of acute intracranial hemorrhage, no definite findings of acute/subacute cerebral ischemia, no mass, mass effect, hydrocephalus or abnormal intra or extra-axial fluid collections. Visualized paranasal sinuses and mastoids are well pneumatized. No acute displaced skull fractures are identified.  IMPRESSION: *No acute intracranial abnormalities.  *The appearance of the brain is normal.   Electronically Signed   By: Vinnie Langton M.D.   On: 11/10/2013 19:43   Ct Abdomen Pelvis W Contrast  11/10/2013   CLINICAL DATA:  Persistent abdominal pain.  EXAM: CT ABDOMEN AND PELVIS WITH CONTRAST  TECHNIQUE: Multidetector CT imaging of the abdomen and pelvis was performed using the standard protocol following bolus administration of intravenous contrast.  CONTRAST:  173mL OMNIPAQUE IOHEXOL 300 MG/ML  SOLN  COMPARISON:  CT of the abdomen and pelvis 10/22/2013.  FINDINGS: Lung Bases: Linear opacities throughout the lung bases bilaterally, predominantly in a peribronchovascular distribution, favored to represent areas of peribronchial inflammation, potentially related to recent recurrent bouts of aspiration. Markedly thickened distal esophagus. Numerous serpiginous structures adjacent to the distal esophagus, presumably esophageal varices. Tip of a porta cath in the right atrium.  Abdomen/Pelvis: The liver has a shrunken appearance and nodular contour, compatible with advanced cirrhosis. No definite focal hepatic lesions are noted on today's limited noncontrast CT examination. Numerous surgical clips are noted adjacent to the liver. Status post cholecystectomy. Potential stone or migrated surgical clip in the proximal aspect of the common bile duct demonstrated on images 19 and 20 of series 2, unchanged. The portal vein is dilated measuring 18 mm. The spleen is massively enlarged measuring 18.6 x 14.1 x 17.7 cm (estimated splenic volume of 2,321 mL).  New compared to the recent prior examination there is marked thickening of the colonic wall involving the cecum and ascending colon. These findings are concerning for colitis. More distal aspect of the colon appears uninvolved. No significant volume of ascites. No pneumoperitoneum. No pathologic distention of small bowel. No definite lymphadenopathy identified within the abdomen or pelvis on today's non contrast  CT  examination. Prostate gland and urinary bladder are unremarkable in appearance. Small umbilical hernia containing only omental fat incidentally noted (unchanged).  Musculoskeletal: Multifocal sclerotic lesions are noted throughout the visualized axial and appendicular skeleton, most apparent within the sacrum at the level of S1, in the right side of the L5 vertebral body, and scattered throughout the remainder of the spine. These findings are similar to prior examinations. At T10 and T11 there is mild compression which is unchanged, with approximately 15-20% loss of height anteriorly at both vertebral body levels. Edema throughout the subcutaneous fat of the flanks bilaterally (right greater than left).  IMPRESSION: 1. Interval development of extensive colonic wall thickening involving the cecum and ascending colon, concerning for colitis. Clinical correlation is recommended. 2. Stigmata of cirrhosis and portal hypertension, including massive splenomegaly and esophageal varices redemonstrated, as above. 3. The appearance of the lung bases suggest sequela of multiple recent bouts of aspiration. 4. Small umbilical hernia containing only omental fat incidentally noted. 5. 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 6. Additional incidental findings, as above.   Electronically Signed   By: Vinnie Langton M.D.   On: 11/10/2013 19:56    Scheduled Meds: . atovaquone  1,500 mg Oral Daily  . azithromycin  1,200 mg Oral Weekly  . Darunavir Ethanolate  800 mg Oral Q breakfast  . diphenoxylate-atropine  2 tablet Oral TID  . emtricitabine-tenofovir  1 tablet Oral QHS  . escitalopram  20 mg Oral Daily  . fluconazole (DIFLUCAN) IV  100 mg Intravenous Q24H  . morphine  15 mg Oral BID  . pantoprazole  40 mg Oral BID  . piperacillin-tazobactam (ZOSYN)  IV  3.375 g Intravenous Q8H  . ritonavir  100 mg  Oral Q breakfast  . sucralfate  1 g Oral TID WC   Continuous Infusions: . dextrose 5 % and 0.9% NaCl 100 mL/hr at 11/11/13 0930      Time spent: 35 minutes    Thorne Wirz  [facility] Pager [phone]. If 7PM-7AM, please contact night-coverage at [email or web address], password [facility] 11/11/2013, 1:53 PM  LOS: 5 days

## 2013-11-11 NOTE — Progress Notes (Signed)
ANTIBIOTIC CONSULT NOTE - FOLLOW UP  Pharmacy Consult for vancomycin Indication: rule out sepsis  Allergies  Allergen Reactions  . Codeine Anaphylaxis    Patient Measurements: Height: 5\' 9"  (175.3 cm) Weight: 203 lb 9.6 oz (92.352 kg) IBW/kg (Calculated) : 70.7  Vital Signs: Temp: 98.3 F (36.8 C) (06/06 1400) Temp src: Oral (06/06 1400) BP: 112/43 mmHg (06/06 1400) Pulse Rate: 95 (06/06 1400) Intake/Output from previous day: 06/05 0701 - 06/06 0700 In: 1638 [P.O.:960; I.V.:2545; IV Piggyback:1400] Out: 950 [Urine:950] Intake/Output from this shift: Total I/O In: 1145 [P.O.:560; I.V.:485; IV Piggyback:100] Out: 300 [Urine:300]  Labs:  Recent Labs  11/09/13 0540 11/09/13 1125 11/10/13 0505 11/10/13 1635 11/11/13 0510  WBC 3.8*  --   --  2.6* 2.8*  HGB 11.0*  --   --  9.8* 9.4*  PLT 56*  --   --  45* 30*  CREATININE  --  1.86* 1.22  --  1.47*   Estimated Creatinine Clearance: 76.5 ml/min (by C-G formula based on Cr of 1.47). No results found for this basename: VANCOTROUGH, Corlis Leak, VANCORANDOM, Warrick, GENTPEAK, GENTRANDOM, TOBRATROUGH, TOBRAPEAK, TOBRARND, AMIKACINPEAK, AMIKACINTROU, AMIKACIN,  in the last 72 hours   Microbiology: Recent Results (from the past 720 hour(s))  CLOSTRIDIUM DIFFICILE BY PCR     Status: None   Collection Time    10/16/13  3:48 PM      Result Value Ref Range Status   C difficile by pcr NEGATIVE  NEGATIVE Final  STOOL CULTURE     Status: None   Collection Time    10/16/13  3:48 PM      Result Value Ref Range Status   Specimen Description STOOL   Final   Special Requests IMMUNE:COMPROMISED   Final   Culture     Final   Value: NO SALMONELLA, SHIGELLA, CAMPYLOBACTER, YERSINIA, OR E.COLI 0157:H7 ISOLATED     Performed at [facility]   Report Status 10/20/2013 FINAL   Final  OVA AND PARASITE EXAMINATION     Status: None   Collection Time    10/16/13  3:48 PM      Result Value Ref Range Status   Specimen Description  STOOL   Final   Special Requests IMMUNE:COMPROMISED   Final   Ova and parasites     Final   Value: NO OVA OR PARASITES SEEN     Performed at [facility]   Report Status 10/17/2013 FINAL   Final  CRYPTOSPORIDIUM SMEAR, FECAL     Status: None   Collection Time    10/16/13  3:48 PM      Result Value Ref Range Status   Specimen Description STOOL   Final   Special Requests NONE   Final   Cryptosporidium Smear.     Final   Value: NO Cryptosporidium Cyclospora or Isospora seen.     Performed at [facility]   Report Status 10/17/2013 FINAL   Final  CULTURE, BLOOD (ROUTINE X 2)     Status: None   Collection Time    10/16/13  6:06 PM      Result Value Ref Range Status   Specimen Description BLOOD RIGHT ANTECUBITAL   Final   Special Requests     Final   Value: BOTTLES DRAWN AEROBIC AND ANAEROBIC 8CC IMMUNE:COMPROMISED   Culture NO GROWTH 5 DAYS   Final   Report Status 10/21/2013 FINAL   Final  CULTURE, BLOOD (ROUTINE X 2)     Status: None  Collection Time    10/16/13  6:11 PM      Result Value Ref Range Status   Specimen Description BLOOD LEFT ANTECUBITAL   Final   Special Requests     Final   Value: BOTTLES DRAWN AEROBIC AND ANAEROBIC 8CC IMMUNE:COMPROMISED   Culture NO GROWTH 5 DAYS   Final   Report Status 10/21/2013 FINAL   Final  STOOL CULTURE     Status: None   Collection Time    11/08/13  2:37 AM      Result Value Ref Range Status   Specimen Description STOOL   Final   Special Requests NONE   Final   Culture     Final   Value: NO SUSPICIOUS COLONIES, CONTINUING TO HOLD     Note: REDUCED NORMAL FLORA PRESENT     Performed at [facility]   Report Status PENDING   Incomplete  OVA AND PARASITE EXAMINATION     Status: None   Collection Time    11/08/13  2:37 AM      Result Value Ref Range Status   Specimen Description STOOL   Final   Special Requests NONE   Final   Ova and parasites     Final   Value: NO OVA OR PARASITES SEEN     Performed at  [facility]   Report Status 11/09/2013 FINAL   Final  CLOSTRIDIUM DIFFICILE BY PCR     Status: None   Collection Time    11/08/13  2:37 AM      Result Value Ref Range Status   C difficile by pcr NEGATIVE  NEGATIVE Final  CULTURE, BLOOD (ROUTINE X 2)     Status: None   Collection Time    11/10/13  4:35 PM      Result Value Ref Range Status   Specimen Description BLOOD LEFT HAND   Final   Special Requests BOTTLES DRAWN AEROBIC ONLY 3CC   Final   Culture  Setup Time     Final   Value: 11/10/2013 22:39     Performed at [facility]   Culture     Final   Value: GRAM POSITIVE COCCI IN CHAINS     Note: Gram Stain Report Called to,Read Back By and Verified With: JILL MOORE 11/11/13 @ 2:40PM BY RUSCOE A.     Performed at [facility]   Report Status PENDING   Incomplete  CULTURE, BLOOD (ROUTINE X 2)     Status: None   Collection Time    11/10/13  4:50 PM      Result Value Ref Range Status   Specimen Description BLOOD LEFT ANTECUBITAL   Final   Special Requests BOTTLES DRAWN AEROBIC ONLY 10CC   Final   Culture  Setup Time     Final   Value: 11/10/2013 22:39     Performed at [facility]   Culture     Final   Value:        BLOOD CULTURE RECEIVED NO GROWTH TO DATE CULTURE WILL BE HELD FOR 5 DAYS BEFORE ISSUING A FINAL NEGATIVE REPORT     Performed at [facility]   Report Status PENDING   Incomplete    Anti-infectives   Start     Dose/Rate Route Frequency Ordered Stop   11/11/13 1000  fluconazole (DIFLUCAN) IVPB 100 mg     100 mg 50 mL/hr over 60 Minutes Intravenous Every 24 hours 11/11/13 0907  11/11/13 0815  metroNIDAZOLE (FLAGYL) IVPB 500 mg  Status:  Discontinued     500 mg 100 mL/hr over 60 Minutes Intravenous Every 8 hours 11/11/13 0801 11/11/13 1144   11/10/13 2145  piperacillin-tazobactam (ZOSYN) IVPB 3.375 g     3.375 g 12.5 mL/hr over 240 Minutes Intravenous Every 8 hours 11/10/13 2133     11/10/13 1000  azithromycin (ZITHROMAX)  tablet 1,200 mg     1,200 mg Oral Weekly 11/07/13 1141     11/10/13 0800  Darunavir Ethanolate (PREZISTA) tablet 800 mg     800 mg Oral Daily with breakfast 11/09/13 1320     11/07/13 1150  sulfamethoxazole-trimethoprim (BACTRIM DS) 800-160 MG per tablet 1 tablet  Status:  Discontinued     1 tablet Oral Daily 11/07/13 1150 11/08/13 1729   11/07/13 0800  ritonavir (NORVIR) capsule 100 mg     100 mg Oral Daily with breakfast 11/06/13 1701     11/06/13 2200  emtricitabine-tenofovir (TRUVADA) 200-300 MG per tablet 1 tablet     1 tablet Oral Daily at bedtime 11/06/13 1701     11/06/13 1715  atovaquone (MEPRON) 750 MG/5ML suspension 1,500 mg     1,500 mg Oral Daily 11/06/13 1701     11/06/13 1715  Darunavir Ethanolate (PREZISTA) tablet 800 mg  Status:  Discontinued     800 mg Oral Daily 11/06/13 1701 11/09/13 1320   11/06/13 1715  metroNIDAZOLE (FLAGYL) IVPB 500 mg  Status:  Discontinued     500 mg 100 mL/hr over 60 Minutes Intravenous Every 8 hours 11/06/13 1710 11/10/13 1511      Assessment: 74 YOM with HIV, on Truvada, DAR/r. Last CD4 count 20, VR 400, known to pharmacy for zosyn dosing. Now with 1/2 blood culture GPC in chains, to start vancomycin.    Zosyn 6/5>> Flagyl 6/1>>6 Atovaquone, zithromax prophylaxis  6/5 : Blood x 2 6/6 : Blood for AFB 6/3 stool - Negative 6/3 Cdiff negative.  Renal: AKI 2/2 dehydration, Scr 0.58 >>1.86 >1.47. CrCl 60-70 ml/min..   Goal of Therapy:  Vancomycin trough level 15-20 mcg/ml  Plan:  - Vancomycin 1000 mg IV Q 12 hrs - f/u renal function and culture results - vancomycin trough at steady state.  Maryanna Shape, PharmD, BCPS  Clinical Pharmacist  Pager: [phone]   11/11/2013,3:30 PM

## 2013-11-11 NOTE — Progress Notes (Signed)
Lab called and pt has positive blood cultures, Aerobic gram + cocci in chains.  Dr. Clementeen Graham texted and notified.

## 2013-11-11 NOTE — Consult Note (Signed)
Consultation  Referring Provider:  [facility]    Primary Care Physician:  PROVIDER NOT IN SYSTEM Primary Gastroenterologist:  Althia Forts .         Reason for Consultation:  diarrhea            HPI:   Levander Katzenstein is a 38 y.o. male with HIV / AIDS , cirrhosis and history of lymphoma who was recently admitted to [facility] then transferred to [facility] for evaluation of fever, nausea, vomiting and diarrhea. Patient has had extensive workup for these symptoms at [facility] including EGD  / colonoscopy, stools studies and CTscan,. Scan showed possible cecal wall thickening. He was discharged from [facility] on Thursday (on empiric antibiotics) but went back to [facility] within a few day for persistent symptoms. Forestine Na subsequently transferred to [facility]. I reviewed records and incorporated them into this note. Patient is on HIV meds (followed in [place]). ID has evaluated this admission and is checking for CMV. Patient has significant abdominal pain, mainly mid abdomen.    Past Medical History  Diagnosis Date  . Cancer   . Lymphoma   . HIV disease   . Cirrhosis   . PTSD (post-traumatic stress disorder)   . Depressive disorder     Past Surgical History  Procedure Laterality Date  . Cholecystectomy    . Appendectomy      [facility] : gastric cancer  History  Substance Use Topics  . Smoking status: Never Smoker   . Smokeless tobacco: Not on file  . Alcohol Use: No    Prior to Admission medications   Medication Sig Start Date End Date Taking? Authorizing Provider  atovaquone (MEPRON) 750 MG/5ML suspension Take 1,500 mg by mouth daily.   Yes Historical Provider, MD  Darunavir Ethanolate (PREZISTA) 800 MG tablet Take 800 mg by mouth daily.   Yes Historical Provider, MD  docusate sodium (STOOL SOFTENER) 100 MG capsule Take 100 mg by mouth 2 (two) times daily.   Yes Historical Provider, MD  emtricitabine-tenofovir (TRUVADA) 200-300 MG per tablet Take 1 tablet by mouth at bedtime.   Yes  Historical Provider, MD  escitalopram (LEXAPRO) 20 MG tablet Take 20 mg by mouth daily.   Yes Historical Provider, MD  furosemide (LASIX) 20 MG tablet Take 20 mg by mouth daily. 11/02/13 11/02/14 Yes Historical Provider, MD  gabapentin (NEURONTIN) 300 MG capsule Take 300 mg by mouth 3 (three) times daily.   Yes Historical Provider, MD  morphine (MS CONTIN) 15 MG 12 hr tablet Take 15 mg by mouth 2 (two) times daily.   Yes Historical Provider, MD  pantoprazole (PROTONIX) 40 MG tablet Take 40 mg by mouth 2 (two) times daily.   Yes Historical Provider, MD  polyethylene glycol (MIRALAX / GLYCOLAX) packet Take 17 g by mouth daily.   Yes Historical Provider, MD  prazosin (MINIPRESS) 1 MG capsule Take 3 mg by mouth at bedtime.   Yes Historical Provider, MD  ritonavir (NORVIR) 100 MG capsule Take 100 mg by mouth daily with breakfast.   Yes Historical Provider, MD  senna (SENOKOT) 8.6 MG tablet Take 2 tablets by mouth 2 (two) times daily.    Yes Historical Provider, MD  spironolactone (ALDACTONE) 50 MG tablet Take 50 mg by mouth daily. 11/02/13 11/02/14 Yes Historical Provider, MD  sucralfate (CARAFATE) 1 G tablet Take 1 g by mouth 3 (three) times daily with meals.    Yes Historical Provider, MD  azithromycin (ZITHROMAX) 600 MG tablet Take 1,200 mg  by mouth every 7 (seven) days.    Historical Provider, MD    Current Facility-Administered Medications  Medication Dose Route Frequency Provider Last Rate Last Dose  . acetaminophen (TYLENOL) tablet 650 mg  650 mg Oral Q6H PRN Radene Gunning, NP   650 mg at 11/10/13 6283   Or  . acetaminophen (TYLENOL) suppository 650 mg  650 mg Rectal Q6H PRN Radene Gunning, NP      . alum & mag hydroxide-simeth (MAALOX/MYLANTA) 200-200-20 MG/5ML suspension 30 mL  30 mL Oral Q6H PRN Radene Gunning, NP      . atovaquone (MEPRON) 750 MG/5ML suspension 1,500 mg  1,500 mg Oral Daily Lezlie Octave Black, NP   1,500 mg at 11/10/13 1355  . azithromycin (ZITHROMAX) tablet 1,200 mg  1,200 mg Oral  Weekly Robbie Lis, MD   1,200 mg at 11/10/13 1059  . Darunavir Ethanolate (PREZISTA) tablet 800 mg  800 mg Oral Q breakfast Nishant Dhungel, MD   800 mg at 11/11/13 0807  . dextrose 5 %-0.9 % sodium chloride infusion   Intravenous Continuous Nishant Dhungel, MD 100 mL/hr at 11/11/13 0930    . diphenoxylate-atropine (LOMOTIL) 2.5-0.025 MG per tablet 2 tablet  2 tablet Oral TID Carlyle Basques, MD   2 tablet at 11/10/13 2115  . emtricitabine-tenofovir (TRUVADA) 200-300 MG per tablet 1 tablet  1 tablet Oral QHS Radene Gunning, NP   1 tablet at 11/10/13 2115  . escitalopram (LEXAPRO) tablet 20 mg  20 mg Oral Daily Radene Gunning, NP   20 mg at 11/10/13 1100  . fluconazole (DIFLUCAN) IVPB 100 mg  100 mg Intravenous Q24H Nishant Dhungel, MD      . HYDROmorphone (DILAUDID) injection 2 mg  2 mg Intravenous Q4H PRN Nishant Dhungel, MD   2 mg at 11/11/13 0625  . metroNIDAZOLE (FLAGYL) IVPB 500 mg  500 mg Intravenous Q8H Nishant Dhungel, MD   500 mg at 11/11/13 0933  . morphine (MS CONTIN) 12 hr tablet 15 mg  15 mg Oral BID Radene Gunning, NP   15 mg at 11/10/13 2115  . pantoprazole (PROTONIX) EC tablet 40 mg  40 mg Oral BID Radene Gunning, NP   40 mg at 11/10/13 2115  . piperacillin-tazobactam (ZOSYN) IVPB 3.375 g  3.375 g Intravenous Q8H Nishant Dhungel, MD   3.375 g at 11/11/13 0504  . promethazine (PHENERGAN) injection 12.5 mg  12.5 mg Intravenous Q6H PRN Crystal Stillinger Robertson, RPH   12.5 mg at 11/11/13 0510  . ritonavir (NORVIR) capsule 100 mg  100 mg Oral Q breakfast Radene Gunning, NP   100 mg at 11/11/13 6629  . sucralfate (CARAFATE) tablet 1 g  1 g Oral TID WC Radene Gunning, NP   1 g at 11/11/13 4765    Allergies as of 11/06/2013 - Review Complete 11/06/2013  Allergen Reaction Noted  . Codeine Anaphylaxis 10/16/2013    CARE EVERYWHERE records.   [facility]scan [facility] 10/23/13 . Liver: Cirrhotic morphology of the liver. No focal hepatic mass. . Gallbladder/Biliary: Cholecystectomy. No biliary  ductal dilatation. Marland Kitchen Spleen: The spleen is enlarged, measuring greater than 17 cm in craniocaudal axis. Marland Kitchen Pancreas: Within normal limits. . Adrenals: Within normal limits. . Kidneys: No hydronephrosis. . Peritoneum/Mesenteries: Small volume ascites. No free intraperitoneal gas. Suspected pneumoperitoneum on ultrasound likely related to numerous perihepatic surgical clips. Fat-containing umbilical hernia. . Extraperitoneum: No lymphadenopathy. . Gastrointestinal tract: No small bowel wall thickening or dilatation. Mild cecal wall thickening, possibly  related to portal colopathy. . Vascular: Esophageal varices recanalized paraumbilical vein with numerous collateral vessels in the ventral abdomen. No splenorenal shunt. Circumaortic renal vein. Marland Kitchen PELVIS: . Peritoneum: Trace free fluid. Marland Kitchen Extraperitoneum: No lymphadenopathy. . Ureters: Within normal limits. . Bladder: Within normal limits. . Reproductive System: Dystrophic calcifications within the prostate gland.  . MSK: Anasarca. Wedge compression deformity of T10 and T11 with increased sclerosis at multiple vertebral body levels.  EGD 10/26/13 description of procedure: The esophageal mucosa appeared normal, with no evident inflammation, Barrett'sepithelium, stricture, or mass. The stomach and pylorus appeared normal.Retroflexed views of the stomach and gastroesophageal junction were normal. Theduodenum appeared normal. We obtained four random biopsies of the duodenumwith cold biopsy forceps. The endoscope was then slowly withdrawn and removed. IMPRESSIONS: Normal EGD.   Colonoscopy 10/26/13 DESCRIPTION OF PROCEDURE: Anoscope was introduced through the anus and advanced to the terminal ileum which was intubated for a short distance. The prep was fair. The instrument was then slowly withdrawnas the colon was fully examined.The colonic mucosa appeared normal. No polyps, masses, inflammatory changes or vascular ectasias were evident. We obtained  four random cold biopsies of the right and left colon. Diverticulosis was not present. Retroflexed view of the rectum was not obtained The scope was then completely withdrawn from the patient and the procedure terminated.  LIMITATIONS: No limitations. COMPLICATIONS: None. ENDOSCOPIC IMPRESSION: Normal Colonoscopy.   SURGICAL PATHOLOGY REPORT  FINAL PATHOLOGIC DIAGNOSIS MICROSCOPIC EXAMINATION PERFORMED AND SUPPOTS DIAGNOSIS  A. DUODENUM, BIOPSY: No diagnostic abnormality.  B. COLON, BIOPSY: No diagnostic abnormality.  Electronically Signed Out By: Jerline Pain, M.D., Pathology 11/01/2013 11:04:50  GI PATHOGEN RESULT NONE DETECTED NONE DETECTED GI PATHOGEN COMMENT THIS ASSAY IS A MULTIPLEX REAL TIME PCR WHICH DETECTS THE FOLLOWING PATHOGENIC ORGANISMS IN STOOL SAMPLES OF PATIENTS WITH DIARRHEA. Comment: BACTERIA: CAMPYLOBACTER SPP., PLESIOMONAS SHIGELLOIDES, SALMONELLA SPP., SHIGA TOXIN-PRODUCING E COLI, E COLI 0157, ENTEROPATHOGENIC E COLI, SHIGELLA SPP., VIBRIO SPP. AND YERSENIA ENTEROCOLITICA. PROTOZOA: CRYPTOSPORIDIUM, CYCLOSPORA CAYETANENSIS, ENT AMOEBA HISTOLYTICA, AND [place]. VIRUSES: ADENOVIRUS F 40/41, ASTROVIRUS, NOROVIRUS GI/GII, ROTAVIRUS A AND SAPOVIRUS.   Review of Systems:    All systems reviewed and negative except where noted in HPI.   Physical Exam:  Vital signs in last 24 hours: Temp:  [97.6 F (36.4 C)-99.2 F (37.3 C)] 98.4 F (36.9 C) (06/06 0622) Pulse Rate:  [91-122] 91 (06/06 0622) Resp:  [17-20] 17 (06/06 0622) BP: (78-98)/(39-62) 98/52 mmHg (06/06 0623) SpO2:  [92 %-95 %] 92 % (06/06 0622) Last BM Date: 11/10/13 General:   Pleasant white male in NAD Head:  Normocephalic and atraumatic. Eyes:   No icterus.   Conjunctiva pink. Ears:  Normal auditory acuity. Neck:  Supple; no masses felt Lungs:  Respirations even and unlabored. Lungs clear to auscultation bilaterally.   No wheezes, crackles, or rhonchi.  Heart:  Regular rate and  rhythm Abdomen:  Soft, nondistended, significant diffuse mid abdominal tenderness.  Normal bowel sounds. No appreciable masses or hepatomegaly.  Rectal:  Not performed.  Msk:  Symmetrical without gross deformities.  Extremities:  Without edema. Neurologic:  Alert and  oriented x4;  grossly normal neurologically. Skin:  Intact without significant lesions or rashes. Cervical Nodes:  No significant cervical adenopathy. Psych:  Alert and cooperative. Normal affect.  LAB RESULTS:  Recent Labs  11/09/13 0540 11/10/13 1635 11/11/13 0510  WBC 3.8* 2.6* 2.8*  HGB 11.0* 9.8* 9.4*  HCT 30.9* 28.0* 26.7*  PLT 56* 45* 30*   BMET  Recent Labs  11/09/13 1125  11/10/13 0505 11/11/13 0510  NA 132* 132* 132*  K 4.3 4.3 4.6  CL 104 105 104  CO2 17* 19 18*  GLUCOSE 99 84 64*  BUN 26* 25* 34*  CREATININE 1.86* 1.22 1.47*  CALCIUM 7.8* 7.9* 7.9*    PT/INR  Recent Labs  11/11/13 0935  LABPROT 27.0*  INR 2.61*    STUDIES: Ct Abdomen Pelvis W Contrast  11/10/2013   CLINICAL DATA:  Persistent abdominal pain.  EXAM: CT ABDOMEN AND PELVIS WITH CONTRAST  TECHNIQUE: Multidetector CT imaging of the abdomen and pelvis was performed using the standard protocol following bolus administration of intravenous contrast.  CONTRAST:  110mL OMNIPAQUE IOHEXOL 300 MG/ML  SOLN  COMPARISON:  CT of the abdomen and pelvis 10/22/2013.  FINDINGS: Lung Bases: Linear opacities throughout the lung bases bilaterally, predominantly in a peribronchovascular distribution, favored to represent areas of peribronchial inflammation, potentially related to recent recurrent bouts of aspiration. Markedly thickened distal esophagus. Numerous serpiginous structures adjacent to the distal esophagus, presumably esophageal varices. Tip of a porta cath in the right atrium.  Abdomen/Pelvis: The liver has a shrunken appearance and nodular contour, compatible with advanced cirrhosis. No definite focal hepatic lesions are noted on today's  limited noncontrast CT examination. Numerous surgical clips are noted adjacent to the liver. Status post cholecystectomy. Potential stone or migrated surgical clip in the proximal aspect of the common bile duct demonstrated on images 19 and 20 of series 2, unchanged. The portal vein is dilated measuring 18 mm. The spleen is massively enlarged measuring 18.6 x 14.1 x 17.7 cm (estimated splenic volume of 2,321 mL).  New compared to the recent prior examination there is marked thickening of the colonic wall involving the cecum and ascending colon. These findings are concerning for colitis. More distal aspect of the colon appears uninvolved. No significant volume of ascites. No pneumoperitoneum. No pathologic distention of small bowel. No definite lymphadenopathy identified within the abdomen or pelvis on today's non contrast CT examination. Prostate gland and urinary bladder are unremarkable in appearance. Small umbilical hernia containing only omental fat incidentally noted (unchanged).  Musculoskeletal: Multifocal sclerotic lesions are noted throughout the visualized axial and appendicular skeleton, most apparent within the sacrum at the level of S1, in the right side of the L5 vertebral body, and scattered throughout the remainder of the spine. These findings are similar to prior examinations. At T10 and T11 there is mild compression which is unchanged, with approximately 15-20% loss of height anteriorly at both vertebral body levels. Edema throughout the subcutaneous fat of the flanks bilaterally (right greater than left).  IMPRESSION: 1. Interval development of extensive colonic wall thickening involving the cecum and ascending colon, concerning for colitis. Clinical correlation is recommended. 2. Stigmata of cirrhosis and portal hypertension, including massive splenomegaly and esophageal varices redemonstrated, as above. 3. The appearance of the lung bases suggest sequela of multiple recent bouts of aspiration.  4. Small umbilical hernia containing only omental fat incidentally noted. 5. 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 6. Additional incidental findings, as above.   Electronically Signed   By: Vinnie Langton M.D.   On: 11/10/2013 19:56   PREVIOUS ENDOSCOPIES:            See HPI   Impression / Plan:   73. 38 year old male with multiple medical problems. Recently admitted to [facility] followed by [facility] where he had an extensive workup  for nausea, vomiting, and diarrhea. Small bowel and colon biopsies negative. Stool pathogen panel negative. CTscan showed mild cecal thickening. Treated empirically for infectious enteritis / colitis. Now admitted with persistent abdominal pain, nausea, vomiting and diarrhea. His repeat CTscan is worse. Now with extensive wall thickening involving not only cecum but ascending colon as well. Patient significantly immunocompromised so infectious etiology still diagnostic consideration despite negative stool pathogen panel at [facility] and again here. He is on Zosyn, Flagyl, ID following. Getting Lomotil. Unfortunately we don't have much to add for now. Will try antispasmodic for cramps. Would keep on clears for now.   2. Cirrhosis. I suspect workup for etiology already done in past. CTscan suggests varices but EGD at [facility] didn't mention presence of varices.   3. Lymphoma. Sclerotic bone lesions on imaging  Thanks   LOS: 5 days   Willia Craze  11/11/2013, 10:05 AM Attending MD note:   I have taken a history, examined the patient, and reviewed the chart. I agree with the Advanced Practitioner's impression and recommendations. I am concerned about  Interval development of toxic megacolon, severe colonic edema, blood cultures positive for enteric pathogen. Immunosuppressed.He is a poor surgical candidate for total colectomy. There may not be any  specific warning signs  for toxic megacolon. Agree with broad spectrum antibiotics. Would stop anti motility agents ( Lomotil). Follow serial KUB's for thumb printing   [facility] Pager # [phone]

## 2013-11-12 ENCOUNTER — Encounter (HOSPITAL_COMMUNITY): Payer: Self-pay | Admitting: Radiology

## 2013-11-12 ENCOUNTER — Inpatient Hospital Stay (HOSPITAL_COMMUNITY): Payer: Medicaid Other

## 2013-11-12 DIAGNOSIS — I4729 Other ventricular tachycardia: Secondary | ICD-10-CM | POA: Diagnosis not present

## 2013-11-12 DIAGNOSIS — I472 Ventricular tachycardia: Secondary | ICD-10-CM

## 2013-11-12 DIAGNOSIS — R7881 Bacteremia: Secondary | ICD-10-CM | POA: Diagnosis not present

## 2013-11-12 DIAGNOSIS — B9689 Other specified bacterial agents as the cause of diseases classified elsewhere: Secondary | ICD-10-CM

## 2013-11-12 DIAGNOSIS — Z8572 Personal history of non-Hodgkin lymphomas: Secondary | ICD-10-CM

## 2013-11-12 LAB — CBC
HEMATOCRIT: 29.8 % — AB (ref 39.0–52.0)
HEMOGLOBIN: 10.4 g/dL — AB (ref 13.0–17.0)
MCH: 35.5 pg — AB (ref 26.0–34.0)
MCHC: 34.9 g/dL (ref 30.0–36.0)
MCV: 101.7 fL — AB (ref 78.0–100.0)
Platelets: 48 10*3/uL — ABNORMAL LOW (ref 150–400)
RBC: 2.93 MIL/uL — AB (ref 4.22–5.81)
RDW: 17.5 % — ABNORMAL HIGH (ref 11.5–15.5)
WBC: 4.4 10*3/uL (ref 4.0–10.5)

## 2013-11-12 LAB — BASIC METABOLIC PANEL
BUN: 23 mg/dL (ref 6–23)
CALCIUM: 8.5 mg/dL (ref 8.4–10.5)
CO2: 19 meq/L (ref 19–32)
Chloride: 107 mEq/L (ref 96–112)
Creatinine, Ser: 0.9 mg/dL (ref 0.50–1.35)
GFR calc Af Amer: >90 mL/min (ref >90)
GFR calc non Af Amer: >90 mL/min (ref >90)
GLUCOSE: 81 mg/dL (ref 70–99)
Potassium: 3.8 mEq/L (ref 3.7–5.3)
SODIUM: 138 meq/L (ref 137–147)

## 2013-11-12 LAB — URINE MICROSCOPIC-ADD ON

## 2013-11-12 LAB — URINALYSIS, ROUTINE W REFLEX MICROSCOPIC
Bilirubin Urine: NEGATIVE
Glucose, UA: NEGATIVE mg/dL
Ketones, ur: NEGATIVE mg/dL
Leukocytes, UA: NEGATIVE
Nitrite: NEGATIVE
PH: 6 (ref 5.0–8.0)
Protein, ur: NEGATIVE mg/dL
SPECIFIC GRAVITY, URINE: 1.011 (ref 1.005–1.030)
UROBILINOGEN UA: 0.2 mg/dL (ref 0.0–1.0)

## 2013-11-12 LAB — TYPE AND SCREEN
ABO/RH(D): A POS
Antibody Screen: NEGATIVE

## 2013-11-12 LAB — ABO/RH: ABO/RH(D): A POS

## 2013-11-12 LAB — MAGNESIUM: MAGNESIUM: 1.6 mg/dL (ref 1.5–2.5)

## 2013-11-12 LAB — PROTIME-INR
INR: 2.23 — ABNORMAL HIGH (ref 0.00–1.49)
Prothrombin Time: 24 seconds — ABNORMAL HIGH (ref 11.6–15.2)

## 2013-11-12 MED ORDER — SODIUM CHLORIDE 0.9 % IJ SOLN
10.0000 mL | INTRAMUSCULAR | Status: DC | PRN
Start: 2013-11-12 — End: 2013-11-25
  Administered 2013-11-12: 20 mL
  Administered 2013-11-12 – 2013-11-15 (×4): 10 mL

## 2013-11-12 MED ORDER — MAGNESIUM SULFATE 40 MG/ML IJ SOLN
2.0000 g | Freq: Once | INTRAMUSCULAR | Status: AC
  Administered 2013-11-12: 2 g via INTRAVENOUS
  Filled 2013-11-12: qty 50

## 2013-11-12 MED ORDER — CHLORPROMAZINE HCL 10 MG PO TABS
10.0000 mg | ORAL_TABLET | Freq: Three times a day (TID) | ORAL | Status: DC | PRN
  Administered 2013-11-12: 10 mg via ORAL
  Filled 2013-11-12: qty 1

## 2013-11-12 NOTE — Significant Event (Signed)
Rapid Response Event Note  Overview:  Called for nursing consult regarding cardiac rhythm.    Initial Focused Assessment: Reviewed chart and EKG strip.   Interventions: Discussed assessment parameters with nurses to evaluate for ectopic rhythms in patients.   Event Summary: Will be available as needed.   at      at          Baron Hamper

## 2013-11-12 NOTE — Progress Notes (Addendum)
    [facility] has been requested to perform a transesophageal echocardiogram on Samuel Schultz to evaluate for endocarditis. Unfortunately per nursing the patient is lethargic and confused and there is no family to be able to discuss the procedure. INR (autoanticoag) also noted. We will have someone from our team re-evaluate in AM for consent and readiness for procedure tomorrow. He has been made NPO after midnight.  Charlie Pitter, PA-C 11/12/2013 3:37 PM

## 2013-11-12 NOTE — H&P (Signed)
Chief Complaint: "Nausea, vomiting, diarrhea with abdominal pain." Referring Physician: Dr. Clementeen Graham HPI: Samuel Schultz is an 38 y.o. male who presented c/o N/V/D and abdominal pain found to have acute colitis. Blood Cx from 6/5 revealed gram (+) cocci in chains and IR has received request for port a catheter removal. 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 He states his right IJ port a catheter was placed 2008 at [facility]. He also has history of HIV/AIDS and Cirrhosis, non-alcoholic. He is found to have coagulopathy with an INR of 2.23 not on anticoagulation and thrombocytopenia, plt 48 k. He denies any active signs of bleeding. He denies any chest pain or shortness of breath. He states he has previously tolerated sedation in the past. He denies any history of sleep apnea or chronic oxygen use.   Past Medical History:  Past Medical History  Diagnosis Date  . Cancer   . Lymphoma   . HIV disease   . Cirrhosis   . PTSD (post-traumatic stress disorder)   . Depressive disorder     Past Surgical History:  Past Surgical History  Procedure Laterality Date  . Cholecystectomy    . Appendectomy      Family History: No family history on file.  Social History:  reports that he has never smoked. He does not have any smokeless tobacco history on file. He reports that he does not drink alcohol or use illicit drugs.  Allergies:  Allergies  Allergen Reactions  . Codeine Anaphylaxis    Medications:   Medication List    ASK your doctor about these medications       atovaquone 750 MG/5ML suspension  Commonly known as:  MEPRON  Take 1,500 mg by mouth daily.     azithromycin 600 MG tablet  Commonly known as:  ZITHROMAX  Take 1,200 mg by mouth every 7 (seven) days.     emtricitabine-tenofovir 200-300 MG per tablet  Commonly known as:  TRUVADA  Take 1 tablet by mouth at bedtime.     escitalopram 20 MG tablet  Commonly known as:  LEXAPRO  Take 20 mg by mouth daily.      furosemide 20 MG tablet  Commonly known as:  LASIX  Take 20 mg by mouth daily.     gabapentin 300 MG capsule  Commonly known as:  NEURONTIN  Take 300 mg by mouth 3 (three) times daily.     morphine 15 MG 12 hr tablet  Commonly known as:  MS CONTIN  Take 15 mg by mouth 2 (two) times daily.     pantoprazole 40 MG tablet  Commonly known as:  PROTONIX  Take 40 mg by mouth 2 (two) times daily.     polyethylene glycol packet  Commonly known as:  MIRALAX / GLYCOLAX  Take 17 g by mouth daily.     prazosin 1 MG capsule  Commonly known as:  MINIPRESS  Take 3 mg by mouth at bedtime.     PREZISTA 800 MG tablet  Generic drug:  Darunavir Ethanolate  Take 800 mg by mouth daily.     ritonavir 100 MG capsule  Commonly known as:  NORVIR  Take 100 mg by mouth daily with breakfast.     senna 8.6 MG tablet  Commonly known as:  SENOKOT  Take 2 tablets by mouth 2 (two) times daily.     spironolactone 50 MG tablet  Commonly known as:  ALDACTONE  Take 50 mg by mouth daily.  STOOL SOFTENER 100 MG capsule  Generic drug:  docusate sodium  Take 100 mg by mouth 2 (two) times daily.     sucralfate 1 G tablet  Commonly known as:  CARAFATE  Take 1 g by mouth 3 (three) times daily with meals.       Please HPI for pertinent positives, otherwise complete 10 system ROS negative.  Physical Exam: BP 109/57  Pulse 96  Temp(Src) 97.5 F (36.4 C) (Oral)  Resp 18  Ht 5' 9"  (1.753 m)  Wt 203 lb 9.6 oz (92.352 kg)  BMI 30.05 kg/m2  SpO2 93% Body mass index is 30.05 kg/(m^2).  General Appearance:  Alert, cooperative, no distress, appears tired  Head:  Normocephalic, without obvious abnormality, atraumatic  Neck: Supple, symmetrical, trachea midline  Lungs:   Clear to auscultation bilaterally, no w/r/r, respirations unlabored without use of accessory muscles.  Chest Wall:  Right sided port a catheter accessed   Heart:  Regular rate and rhythm, S1, S2 normal, no murmur, rub or gallop.   Abdomen:   Soft, diffuse tenderness, non distended.  Extremities: Extremities normal, atraumatic, 1+ edema   Neurologic: Normal affect, no gross deficits.   Results for orders placed during the hospital encounter of 11/06/13 (from the past 48 hour(s))  GLUCOSE, CAPILLARY     Status: None   Collection Time    11/10/13  3:05 PM      Result Value Ref Range   Glucose-Capillary 91  70 - 99 mg/dL  LACTIC ACID, PLASMA     Status: Abnormal   Collection Time    11/10/13  4:35 PM      Result Value Ref Range   Lactic Acid, Venous 5.4 (*) 0.5 - 2.2 mmol/L  CULTURE, BLOOD (ROUTINE X 2)     Status: None   Collection Time    11/10/13  4:35 PM      Result Value Ref Range   Specimen Description BLOOD LEFT HAND     Special Requests BOTTLES DRAWN AEROBIC ONLY 3CC     Culture  Setup Time       Value: 11/10/2013 22:39     Performed at [facility]   Culture       Value: [place] IN CHAINS     Note: Gram Stain Report Called to,Read Back By and Verified With: JILL MOORE 11/11/13 @ 2:40PM BY RUSCOE A.     Performed at [facility]   Report Status PENDING    CBC     Status: Abnormal   Collection Time    11/10/13  4:35 PM      Result Value Ref Range   WBC 2.6 (*) 4.0 - 10.5 K/uL   RBC 2.71 (*) 4.22 - 5.81 MIL/uL   Hemoglobin 9.8 (*) 13.0 - 17.0 g/dL   HCT 28.0 (*) 39.0 - 52.0 %   MCV 103.3 (*) 78.0 - 100.0 fL   MCH 36.2 (*) 26.0 - 34.0 pg   MCHC 35.0  30.0 - 36.0 g/dL   RDW 17.7 (*) 11.5 - 15.5 %   Platelets 45 (*) 150 - 400 K/uL   Comment: CONSISTENT WITH PREVIOUS RESULT  CULTURE, BLOOD (ROUTINE X 2)     Status: None   Collection Time    11/10/13  4:50 PM      Result Value Ref Range   Specimen Description BLOOD LEFT ANTECUBITAL     Special Requests BOTTLES DRAWN AEROBIC ONLY 10CC     Culture  Setup  Time       Value: 11/10/2013 22:39     Performed at [facility]   Culture       Value: [place] IN CHAINS     Note: Gram Stain Report Called  to,Read Back By and Verified With: JILL MOORE 11/11/13 @ 4:03PM BY RUSCOE A.     Performed at [facility]   Report Status PENDING    CBC     Status: Abnormal   Collection Time    11/11/13  5:10 AM      Result Value Ref Range   WBC 2.8 (*) 4.0 - 10.5 K/uL   RBC 2.60 (*) 4.22 - 5.81 MIL/uL   Hemoglobin 9.4 (*) 13.0 - 17.0 g/dL   HCT 26.7 (*) 39.0 - 52.0 %   MCV 102.7 (*) 78.0 - 100.0 fL   MCH 36.2 (*) 26.0 - 34.0 pg   MCHC 35.2  30.0 - 36.0 g/dL   RDW 18.0 (*) 11.5 - 15.5 %   Platelets 30 (*) 150 - 400 K/uL   Comment: REPEATED TO VERIFY     CONSISTENT WITH PREVIOUS RESULT  BASIC METABOLIC PANEL     Status: Abnormal   Collection Time    11/11/13  5:10 AM      Result Value Ref Range   Sodium 132 (*) 137 - 147 mEq/L   Potassium 4.6  3.7 - 5.3 mEq/L   Chloride 104  96 - 112 mEq/L   CO2 18 (*) 19 - 32 mEq/L   Glucose, Bld 64 (*) 70 - 99 mg/dL   BUN 34 (*) 6 - 23 mg/dL   Creatinine, Ser 1.47 (*) 0.50 - 1.35 mg/dL   Calcium 7.9 (*) 8.4 - 10.5 mg/dL   GFR calc non Af Amer 59 (*) >90 mL/min   GFR calc Af Amer 68 (*) >90 mL/min   Comment: (NOTE)     The eGFR has been calculated using the CKD EPI equation.     This calculation has not been validated in all clinical situations.     eGFR's persistently <90 mL/min signify possible Chronic Kidney     Disease.  AFB CULTURE, BLOOD     Status: None   Collection Time    11/11/13  9:35 AM      Result Value Ref Range   Specimen Description BLOOD LEFT HAND     Special Requests 5CC     Culture       Value: CULTURE WILL BE EXAMINED FOR 6 WEEKS BEFORE ISSUING A FINAL REPORT     Performed at [facility]   Report Status PENDING    LACTIC ACID, PLASMA     Status: None   Collection Time    11/11/13  9:35 AM      Result Value Ref Range   Lactic Acid, Venous 1.8  0.5 - 2.2 mmol/L  CORTISOL     Status: None   Collection Time    11/11/13  9:35 AM      Result Value Ref Range   Cortisol, Plasma 11.5     Comment: (NOTE)     AM:   4.3 - 22.4 ug/dL     PM:  3.1 - 16.7 ug/dL     Performed at [facility]  PROTIME-INR     Status: Abnormal   Collection Time    11/11/13  9:35 AM      Result Value Ref Range   Prothrombin Time 27.0 (*) 11.6 -  15.2 seconds   INR 2.61 (*) 0.00 - 1.49  CLOSTRIDIUM DIFFICILE BY PCR     Status: None   Collection Time    11/11/13  4:18 PM      Result Value Ref Range   C difficile by pcr NEGATIVE  NEGATIVE  CBC     Status: Abnormal   Collection Time    11/12/13  4:15 AM      Result Value Ref Range   WBC 4.4  4.0 - 10.5 K/uL   RBC 2.93 (*) 4.22 - 5.81 MIL/uL   Hemoglobin 10.4 (*) 13.0 - 17.0 g/dL   HCT 29.8 (*) 39.0 - 52.0 %   MCV 101.7 (*) 78.0 - 100.0 fL   MCH 35.5 (*) 26.0 - 34.0 pg   MCHC 34.9  30.0 - 36.0 g/dL   RDW 17.5 (*) 11.5 - 15.5 %   Platelets 48 (*) 150 - 400 K/uL   Comment: DELTA CHECK NOTED  BASIC METABOLIC PANEL     Status: None   Collection Time    11/12/13  4:15 AM      Result Value Ref Range   Sodium 138  137 - 147 mEq/L   Potassium 3.8  3.7 - 5.3 mEq/L   Comment: DELTA CHECK NOTED   Chloride 107  96 - 112 mEq/L   CO2 19  19 - 32 mEq/L   Glucose, Bld 81  70 - 99 mg/dL   BUN 23  6 - 23 mg/dL   Creatinine, Ser 0.90  0.50 - 1.35 mg/dL   Comment: DELTA CHECK NOTED   Calcium 8.5  8.4 - 10.5 mg/dL   GFR calc non Af Amer >90  >90 mL/min   GFR calc Af Amer >90  >90 mL/min   Comment: (NOTE)     The eGFR has been calculated using the CKD EPI equation.     This calculation has not been validated in all clinical situations.     eGFR's persistently <90 mL/min signify possible Chronic Kidney     Disease.  PROTIME-INR     Status: Abnormal   Collection Time    11/12/13  4:15 AM      Result Value Ref Range   Prothrombin Time 24.0 (*) 11.6 - 15.2 seconds   INR 2.23 (*) 0.00 - 1.49  MAGNESIUM     Status: None   Collection Time    11/12/13  4:15 AM      Result Value Ref Range   Magnesium 1.6  1.5 - 2.5 mg/dL   Ct Head Wo Contrast  11/10/2013   CLINICAL  DATA:  Persistent abdominal pain.  Dizziness.  Headache.  EXAM: CT HEAD WITHOUT CONTRAST  TECHNIQUE: Contiguous axial images were obtained from the base of the skull through the vertex without intravenous contrast.  COMPARISON:  No priors.  FINDINGS: No acute intracranial abnormalities. Specifically, no evidence of acute intracranial hemorrhage, no definite findings of acute/subacute cerebral ischemia, no mass, mass effect, hydrocephalus or abnormal intra or extra-axial fluid collections. Visualized paranasal sinuses and mastoids are well pneumatized. No acute displaced skull fractures are identified.  IMPRESSION: *No acute intracranial abnormalities. *The appearance of the brain is normal.   Electronically Signed   By: Vinnie Langton M.D.   On: 11/10/2013 19:43   Ct Abdomen Pelvis W Contrast  11/10/2013   CLINICAL DATA:  Persistent abdominal pain.  EXAM: CT ABDOMEN AND PELVIS WITH CONTRAST  TECHNIQUE: Multidetector CT imaging of the abdomen and pelvis was performed using the  standard protocol following bolus administration of intravenous contrast.  CONTRAST:  182m OMNIPAQUE IOHEXOL 300 MG/ML  SOLN  COMPARISON:  CT of the abdomen and pelvis 10/22/2013.  FINDINGS: Lung Bases: Linear opacities throughout the lung bases bilaterally, predominantly in a peribronchovascular distribution, favored to represent areas of peribronchial inflammation, potentially related to recent recurrent bouts of aspiration. Markedly thickened distal esophagus. Numerous serpiginous structures adjacent to the distal esophagus, presumably esophageal varices. Tip of a porta cath in the right atrium.  Abdomen/Pelvis: The liver has a shrunken appearance and nodular contour, compatible with advanced cirrhosis. No definite focal hepatic lesions are noted on today's limited noncontrast CT examination. Numerous surgical clips are noted adjacent to the liver. Status post cholecystectomy. Potential stone or migrated surgical clip in the proximal  aspect of the common bile duct demonstrated on images 19 and 20 of series 2, unchanged. The portal vein is dilated measuring 18 mm. The spleen is massively enlarged measuring 18.6 x 14.1 x 17.7 cm (estimated splenic volume of 2,321 mL).  New compared to the recent prior examination there is marked thickening of the colonic wall involving the cecum and ascending colon. These findings are concerning for colitis. More distal aspect of the colon appears uninvolved. No significant volume of ascites. No pneumoperitoneum. No pathologic distention of small bowel. No definite lymphadenopathy identified within the abdomen or pelvis on today's non contrast CT examination. Prostate gland and urinary bladder are unremarkable in appearance. Small umbilical hernia containing only omental fat incidentally noted (unchanged).  Musculoskeletal: Multifocal sclerotic lesions are noted throughout the visualized axial and appendicular skeleton, most apparent within the sacrum at the level of S1, in the right side of the L5 vertebral body, and scattered throughout the remainder of the spine. These findings are similar to prior examinations. At T10 and T11 there is mild compression which is unchanged, with approximately 15-20% loss of height anteriorly at both vertebral body levels. Edema throughout the subcutaneous fat of the flanks bilaterally (right greater than left).  IMPRESSION: 1. Interval development of extensive colonic wall thickening involving the cecum and ascending colon, concerning for colitis. Clinical correlation is recommended. 2. Stigmata of cirrhosis and portal hypertension, including massive splenomegaly and esophageal varices redemonstrated, as above. 3. The appearance of the lung bases suggest sequela of multiple recent bouts of aspiration. 4. Small umbilical hernia containing only omental fat incidentally noted. 5. Multifocal predominantly sclerotic bony lesions again noted, suggesting malignant involvement of the  bones in this patient with history of large cell lymphoma. 6. Additional incidental findings, as above.   Electronically Signed   By: DVinnie LangtonM.D.   On: 11/10/2013 19:56    Assessment/Plan Acute colitis Bacteremia, gram (+) cocci in chains 11/10/13 HIV/AIDS on HAART, CD4 20/ viral 400 History of large cell lymphoma in remission last chemo 2012 Right [facility] Coagulopathy, INR 2.23 not on anticoagulation, no active signs of bleeding. Thrombocytopenia, plt 48 k, no active signs of bleeding. Request for port removal with sedation Patient will be NPO after midnight, cbc and INR to be drawn in am Will d/w Dr. YKathlene Coteon 6/8 regarding DDAVP prior to procedure. If IR does port removal 6/8, patient will need IV access prior for sedation.    KHedy JacobPA-C 11/12/2013, 11:00 AM

## 2013-11-12 NOTE — Progress Notes (Addendum)
[facility]  Ceftriaxone day # 2 Flagyl day # 7  Subjective: still having diarrhea    Antibiotics:  Anti-infectives   Start     Dose/Rate Route Frequency Ordered Stop   11/11/13 1830  cefTRIAXone (ROCEPHIN) 2 g in dextrose 5 % 50 mL IVPB     2 g 100 mL/hr over 30 Minutes Intravenous Every 24 hours 11/11/13 1744     11/11/13 1745  metroNIDAZOLE (FLAGYL) tablet 500 mg     500 mg Oral 3 times per day 11/11/13 1744     11/11/13 1600  vancomycin (VANCOCIN) IVPB 1000 mg/200 mL premix     1,000 mg 200 mL/hr over 60 Minutes Intravenous Every 12 hours 11/11/13 1555     11/11/13 1000  fluconazole (DIFLUCAN) IVPB 100 mg     100 mg 50 mL/hr over 60 Minutes Intravenous Every 24 hours 11/11/13 0907     11/11/13 0815  metroNIDAZOLE (FLAGYL) IVPB 500 mg  Status:  Discontinued     500 mg 100 mL/hr over 60 Minutes Intravenous Every 8 hours 11/11/13 0801 11/11/13 1144   11/10/13 2145  piperacillin-tazobactam (ZOSYN) IVPB 3.375 g  Status:  Discontinued     3.375 g 12.5 mL/hr over 240 Minutes Intravenous Every 8 hours 11/10/13 2133 11/11/13 1744   11/10/13 1000  azithromycin (ZITHROMAX) tablet 1,200 mg     1,200 mg Oral Weekly 11/07/13 1141     11/10/13 0800  Darunavir Ethanolate (PREZISTA) tablet 800 mg     800 mg Oral Daily with breakfast 11/09/13 1320     11/07/13 1150  sulfamethoxazole-trimethoprim (BACTRIM DS) 800-160 MG per tablet 1 tablet  Status:  Discontinued     1 tablet Oral Daily 11/07/13 1150 11/08/13 1729   11/07/13 0800  ritonavir (NORVIR) capsule 100 mg     100 mg Oral Daily with breakfast 11/06/13 1701     11/06/13 2200  emtricitabine-tenofovir (TRUVADA) 200-300 MG per tablet 1 tablet     1 tablet Oral Daily at bedtime 11/06/13 1701     11/06/13 1715  atovaquone (MEPRON) 750 MG/5ML suspension 1,500 mg     1,500 mg Oral Daily 11/06/13 1701     11/06/13 1715  Darunavir Ethanolate (PREZISTA) tablet 800 mg  Status:  Discontinued     800 mg Oral Daily  11/06/13 1701 11/09/13 1320   11/06/13 1715  metroNIDAZOLE (FLAGYL) IVPB 500 mg  Status:  Discontinued     500 mg 100 mL/hr over 60 Minutes Intravenous Every 8 hours 11/06/13 1710 11/10/13 1511      Medications: Scheduled Meds: . atovaquone  1,500 mg Oral Daily  . azithromycin  1,200 mg Oral Weekly  . cefTRIAXone (ROCEPHIN)  IV  2 g Intravenous Q24H  . Darunavir Ethanolate  800 mg Oral Q breakfast  . diphenoxylate-atropine  2 tablet Oral TID  . emtricitabine-tenofovir  1 tablet Oral QHS  . fluconazole (DIFLUCAN) IV  100 mg Intravenous Q24H  . metroNIDAZOLE  500 mg Oral 3 times per day  . morphine  15 mg Oral BID  . pantoprazole  40 mg Oral BID  . ritonavir  100 mg Oral Q breakfast  . sucralfate  1 g Oral TID WC  . vancomycin  1,000 mg Intravenous Q12H   Continuous Infusions: . dextrose 5 % and 0.9% NaCl 100 mL/hr at 11/12/13 1834   PRN Meds:.acetaminophen, acetaminophen, alum & mag hydroxide-simeth, chlorproMAZINE, HYDROmorphone (DILAUDID) injection, hyoscyamine, promethazine, sodium chloride    Objective: Weight change:  Intake/Output Summary (Last 24 hours) at 11/12/13 2047 Last data filed at 11/12/13 1856  Gross per 24 hour  Intake   3225 ml  Output   2100 ml  Net   1125 ml   Blood pressure 110/82, pulse 89, temperature 98.2 F (36.8 C), temperature source Oral, resp. rate 18, height 5\' 9"  (1.753 m), weight 203 lb 9.6 oz (92.352 kg), SpO2 94.00%. Temp:  [97.5 F (36.4 C)-98.4 F (36.9 C)] 98.2 F (36.8 C) (06/07 1310) Pulse Rate:  [89-98] 89 (06/07 1310) Resp:  [18-19] 18 (06/07 1310) BP: (106-110)/(57-82) 110/82 mmHg (06/07 1310) SpO2:  [93 %-94 %] 94 % (06/07 1310)  Physical Exam: General: Alert and awake, oriented x3, HEENT: EOMI CVS regular rate, normal r,  no murmur rubs or gallops Chest: clear to auscultation bilaterally, no wheezing, rales or rhonchi Abdomen: soft tender diffusely distended Neuro: nonfocal  CBC:  Recent Labs Lab 11/08/13 0940  11/09/13 0540 11/10/13 1635 11/11/13 0510 11/11/13 0935 11/12/13 0415  HGB 11.6* 11.0* 9.8* 9.4*  --  10.4*  HCT 33.0* 30.9* 28.0* 26.7*  --  29.8*  PLT 60* 56* 45* 30*  --  48*  INR  --   --   --   --  2.61* 2.23*     BMET  Recent Labs  11/11/13 0510 11/12/13 0415  NA 132* 138  K 4.6 3.8  CL 104 107  CO2 18* 19  GLUCOSE 64* 81  BUN 34* 23  CREATININE 1.47* 0.90  CALCIUM 7.9* 8.5     Liver Panel  No results found for this basename: PROT, ALBUMIN, AST, ALT, ALKPHOS, BILITOT, BILIDIR, IBILI,  in the last 72 hours     Sedimentation Rate No results found for this basename: ESRSEDRATE,  in the last 72 hours C-Reactive Protein No results found for this basename: CRP,  in the last 72 hours  Micro Results: Recent Results (from the past 240 hour(s))  STOOL CULTURE     Status: None   Collection Time    11/08/13  2:37 AM      Result Value Ref Range Status   Specimen Description STOOL   Final   Special Requests NONE   Final   Culture     Final   Value: NO SUSPICIOUS COLONIES, CONTINUING TO HOLD     Note: REDUCED NORMAL FLORA PRESENT     Performed at [facility]   Report Status PENDING   Incomplete  OVA AND PARASITE EXAMINATION     Status: None   Collection Time    11/08/13  2:37 AM      Result Value Ref Range Status   Specimen Description STOOL   Final   Special Requests NONE   Final   Ova and parasites     Final   Value: NO OVA OR PARASITES SEEN     Performed at [facility]   Report Status 11/09/2013 FINAL   Final  CLOSTRIDIUM DIFFICILE BY PCR     Status: None   Collection Time    11/08/13  2:37 AM      Result Value Ref Range Status   C difficile by pcr NEGATIVE  NEGATIVE Final  CULTURE, BLOOD (ROUTINE X 2)     Status: None   Collection Time    11/10/13  4:35 PM      Result Value Ref Range Status   Specimen Description BLOOD LEFT HAND   Final   Special Requests BOTTLES DRAWN AEROBIC ONLY 3CC  Final   Culture  Setup Time     Final    Value: 11/10/2013 22:39     Performed at [facility]   Culture     Final   Value: GRAM POSITIVE COCCI IN CHAINS     Note: Gram Stain Report Called to,Read Back By and Verified With: JILL MOORE 11/11/13 @ 2:40PM BY RUSCOE A.     Performed at [facility]   Report Status PENDING   Incomplete  CULTURE, BLOOD (ROUTINE X 2)     Status: None   Collection Time    11/10/13  4:50 PM      Result Value Ref Range Status   Specimen Description BLOOD LEFT ANTECUBITAL   Final   Special Requests BOTTLES DRAWN AEROBIC ONLY 10CC   Final   Culture  Setup Time     Final   Value: 11/10/2013 22:39     Performed at [facility]   Culture     Final   Value: GRAM POSITIVE COCCI IN CHAINS     Note: Gram Stain Report Called to,Read Back By and Verified With: JILL MOORE 11/11/13 @ 4:03PM BY RUSCOE A.     Performed at [facility]   Report Status PENDING   Incomplete  AFB CULTURE, BLOOD     Status: None   Collection Time    11/11/13  9:35 AM      Result Value Ref Range Status   Specimen Description BLOOD LEFT HAND   Final   Special Requests 5CC   Final   Culture     Final   Value: CULTURE WILL BE EXAMINED FOR 6 WEEKS BEFORE ISSUING A FINAL REPORT     Performed at [facility]   Report Status PENDING   Incomplete  CLOSTRIDIUM DIFFICILE BY PCR     Status: None   Collection Time    11/11/13  4:18 PM      Result Value Ref Range Status   C difficile by pcr NEGATIVE  NEGATIVE Final  CULTURE, BLOOD (ROUTINE X 2)     Status: None   Collection Time    11/11/13  8:25 PM      Result Value Ref Range Status   Specimen Description BLOOD RIGHT ARM   Final   Special Requests BOTTLES DRAWN AEROBIC AND ANAEROBIC [facility] EACH   Final   Culture  Setup Time     Final   Value: 11/12/2013 01:05     Performed at [facility]   Culture     Final   Value: GRAM POSITIVE COCCI IN CHAINS     Note: Gram Stain Report Called to,Read Back By and Verified With: FRANCIS PLEASANT 11/12/13 @  8:25PM BY RUSCOE A.     Performed at [facility]   Report Status PENDING   Incomplete    Studies/Results: Dg Abd 1 View  11/12/2013   CLINICAL DATA:  Colitis.  EXAM: ABDOMEN - 1 VIEW  COMPARISON:  CT of the abdomen and pelvis 11/10/2013.  FINDINGS: Oral contrast material is noted throughout the colon and distal rectum. No pathologic distention of small bowel. No pneumoperitoneum. Multiple surgical clips project over the right upper quadrant of the abdomen. Splenic contour appears enlarged.  IMPRESSION: 1. Nonobstructive bowel gas pattern. 2. No pneumoperitoneum. 3. Splenomegaly.   Electronically Signed   By: Vinnie Langton M.D.   On: 11/12/2013 11:47      Assessment/Plan:  Principal Problem:   Nausea and vomiting  in adult Active Problems:   AIDS   Large cell lymphoma   Cirrhosis, non-alcoholic   Pancytopenia   Enteritis   Other pancytopenia   Acute kidney injury   Hypotension, unspecified   Sepsis   Hypoglycemia   Other and unspecified noninfectious gastroenteritis and colitis(558.9)   Gram-positive cocci bacteremia   NSVT (nonsustained ventricular tachycardia)    Samuel Schultz is a 38 y.o. male with  HIV/AIDS, hx of large cell lymphoma, currently with VL down but cd4 still low problems with severe diarrhea extensive workup at [facility] including colonoscopy  admitted to [facility] with development of extensive colonic wall thickening involving the cecum and ascending colon," concerning for colitis" with negative C diff PCR again. Now found to be bacteremia with GP C in pairs  #1 GP C in pairs: given extensive colonic involvement would suspect organisms such as Streptococcus Bovis or similar organsm. I am concerned that pt very likely also has endocarditis  --continue rocephin 2 grams daily --obtaine TEE --repeat blood cultures  #2 Diffuse colitis: worse since admission by imaging  --I will continue rocephin and flagyl   I still think we likely need another  endoscopic evaluation with potential repeat biopsy for pathology, tissue for CMV by PCR.    LOS: 6 days   Truman Hayward 11/12/2013, 8:47 PM

## 2013-11-12 NOTE — Progress Notes (Signed)
Subjective  Hiccups since yesterday   Objective  Slightly better today, pt had 6 small BM's , c/o crampy abd. pain, WBC up to 4 400,  Tolerating clear liquids.  Vital signs in last 24 hours: Temp:  [97.5 F (36.4 C)-98.4 F (36.9 C)] 97.5 F (36.4 C) (06/07 0558) Pulse Rate:  [95-98] 96 (06/07 0558) Resp:  [18-19] 18 (06/07 0558) BP: (106-112)/(43-62) 109/57 mmHg (06/07 0558) SpO2:  [93 %-94 %] 93 % (06/07 0558) Last BM Date: 11/11/13 General:    white male in NAD Heart:  Regular rapid rate and rhythm; no murmurs Lungs: Respirations even and unlabored, lungs CTA bilaterally, appears slightly dyspneic Abdomen:  Soft, tender diffsely , no rebound. Normal bowel sounds. Extremities:  Without edema. Neurologic:  Alert and oriented,  grossly normal neurologically. Psych:  Cooperative. Normal mood and affect., appears sick  Intake/Output from previous day: 06/06 0701 - 06/07 0700 In: 4742 [P.O.:920; I.V.:485; IV Piggyback:100] Out: 1500 [Urine:1500] Intake/Output this shift:    Lab Results:  Recent Labs  11/10/13 1635 11/11/13 0510 11/12/13 0415  WBC 2.6* 2.8* 4.4  HGB 9.8* 9.4* 10.4*  HCT 28.0* 26.7* 29.8*  PLT 45* 30* 48*   BMET  Recent Labs  11/10/13 0505 11/11/13 0510 11/12/13 0415  NA 132* 132* 138  K 4.3 4.6 3.8  CL 105 104 107  CO2 19 18* 19  GLUCOSE 84 64* 81  BUN 25* 34* 23  CREATININE 1.22 1.47* 0.90  CALCIUM 7.9* 7.9* 8.5   LFT No results found for this basename: PROT, ALBUMIN, AST, ALT, ALKPHOS, BILITOT, BILIDIR, IBILI,  in the last 72 hours PT/INR  Recent Labs  11/11/13 0935 11/12/13 0415  LABPROT 27.0* 24.0*  INR 2.61* 2.23*    Studies/Results: Ct Head Wo Contrast  11/10/2013   CLINICAL DATA:  Persistent abdominal pain.  Dizziness.  Headache.  EXAM: CT HEAD WITHOUT CONTRAST  TECHNIQUE: Contiguous axial images were obtained from the base of the skull through the vertex without intravenous contrast.  COMPARISON:  No priors.  FINDINGS: No  acute intracranial abnormalities. Specifically, no evidence of acute intracranial hemorrhage, no definite findings of acute/subacute cerebral ischemia, no mass, mass effect, hydrocephalus or abnormal intra or extra-axial fluid collections. Visualized paranasal sinuses and mastoids are well pneumatized. No acute displaced skull fractures are identified.  IMPRESSION: *No acute intracranial abnormalities. *The appearance of the brain is normal.   Electronically Signed   By: Vinnie Langton M.D.   On: 11/10/2013 19:43   Ct Abdomen Pelvis W Contrast  11/10/2013   CLINICAL DATA:  Persistent abdominal pain.  EXAM: CT ABDOMEN AND PELVIS WITH CONTRAST  TECHNIQUE: Multidetector CT imaging of the abdomen and pelvis was performed using the standard protocol following bolus administration of intravenous contrast.  CONTRAST:  167mL OMNIPAQUE IOHEXOL 300 MG/ML  SOLN  COMPARISON:  CT of the abdomen and pelvis 10/22/2013.  FINDINGS: Lung Bases: Linear opacities throughout the lung bases bilaterally, predominantly in a peribronchovascular distribution, favored to represent areas of peribronchial inflammation, potentially related to recent recurrent bouts of aspiration. Markedly thickened distal esophagus. Numerous serpiginous structures adjacent to the distal esophagus, presumably esophageal varices. Tip of a porta cath in the right atrium.  Abdomen/Pelvis: The liver has a shrunken appearance and nodular contour, compatible with advanced cirrhosis. No definite focal hepatic lesions are noted on today's limited noncontrast CT examination. Numerous surgical clips are noted adjacent to the liver. Status post cholecystectomy. Potential stone or migrated surgical clip in the proximal aspect of the  common bile duct demonstrated on images 19 and 20 of series 2, unchanged. The portal vein is dilated measuring 18 mm. The spleen is massively enlarged measuring 18.6 x 14.1 x 17.7 cm (estimated splenic volume of 2,321 mL).  New compared to  the recent prior examination there is marked thickening of the colonic wall involving the cecum and ascending colon. These findings are concerning for colitis. More distal aspect of the colon appears uninvolved. No significant volume of ascites. No pneumoperitoneum. No pathologic distention of small bowel. No definite lymphadenopathy identified within the abdomen or pelvis on today's non contrast CT examination. Prostate gland and urinary bladder are unremarkable in appearance. Small umbilical hernia containing only omental fat incidentally noted (unchanged).  Musculoskeletal: Multifocal sclerotic lesions are noted throughout the visualized axial and appendicular skeleton, most apparent within the sacrum at the level of S1, in the right side of the L5 vertebral body, and scattered throughout the remainder of the spine. These findings are similar to prior examinations. At T10 and T11 there is mild compression which is unchanged, with approximately 15-20% loss of height anteriorly at both vertebral body levels. Edema throughout the subcutaneous fat of the flanks bilaterally (right greater than left).  IMPRESSION: 1. Interval development of extensive colonic wall thickening involving the cecum and ascending colon, concerning for colitis. Clinical correlation is recommended. 2. Stigmata of cirrhosis and portal hypertension, including massive splenomegaly and esophageal varices redemonstrated, as above. 3. The appearance of the lung bases suggest sequela of multiple recent bouts of aspiration. 4. Small umbilical hernia containing only omental fat incidentally noted. 5. 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 6. Additional incidental findings, as above.   Electronically Signed   By: Vinnie Langton M.D.   On: 11/10/2013 19:56       Assessment / Plan:   Acute colitis, appears improved but still  very sick, watch for toxic megacolon, will continue clear liquids and all other meds Hiccups- start low dose Thorazine 10 mg tid prn  Principal Problem:   Nausea and vomiting in adult Active Problems:   AIDS   Large cell lymphoma   Cirrhosis, non-alcoholic   Pancytopenia   Enteritis   Other pancytopenia   Acute kidney injury   Hypotension, unspecified   Sepsis   Hypoglycemia   Other and unspecified noninfectious gastroenteritis and colitis(558.9)     LOS: 6 days   Samuel Schultz  11/12/2013, 8:10 AM

## 2013-11-12 NOTE — Progress Notes (Addendum)
[facility] PROGRESS NOTE  [name] PZW:[id number] DOB: February 21, 1976 DOA: 11/06/2013 PCP: PROVIDER NOT IN SYSTEM   Brief narrative 38 year old male with past medical history of HIV/AIDS on HAART (last CD4 10/18/2013 20), large cell lymphoma with liver infiltration (treawted with R-[facility] in 2010-2012 (followed at [facility]), recent admission for colitis (transferred to [facility] on 10/22/2013) where he had full w/up including negative stool studies, cx , negative colonoscopy and a bone marrow bx showing hypercellular marrow without finidings of lymphoma or leukemia. He showed improvement in symptoms there an was discharged off abx on 5/22. He then  presented to [facility] ED 11/06/2013 with ongoing fatigue, weakness, diarrhea. He was transferred to [facility] for further care per GI (Dr. Dudley Major in [facility]) recommendations as he thought that the pt requires higher level of care.  Pt was started on flagyl for treatment of possible enteritis. HIs stool studies are pending.CT abd done showed wall thickening throughout colon.  Assessment/Plan: Persistent colitis -possibly related to AIDS given recurrent symptoms for over 3 weeks - repeat stool for C.diff negative, stool culture, ova and parasites negative. Symptoms not improved on Lomotil. -Flagyl discontinued on 6/5 but was restarted even worsened colitis. Added to Zosyn. Lactic acid elevated with worsened colitis on CT scan from 6/5. -Followup blood culture from 6/5. -Patient continues to have diarrhea although frequency had reduced in last 24 hours. No nausea or vomiting but has persistent abdominal pain. Lactic acid normalized. Zosyn switched to Rocephin given bacteremia. -IV hydration and clear liquid diet. GI consulted. No further workup recommended.  Active Problems:  GPC bacteremia On 6/5 patient was hypotensive and hypoglycemic. Repeat CT scan of the abdomen showed worsening colitis. Blood culture 2/2 on 6/5 and gram-positive cocci. Placed on IV Rocephin. Continue  vancomycin and Flagyl. Patient has a right-sided Port-A-Cath for is a lymphoma least few years back which needs to be removed.( IR plan on removal in am). I have discussed with ID and and we will cardiology for TEE given bacteremia and marked immunosuppression.Marland Kitchen   HIV/AIDS on HAART  - appreciate ID consult and recommendations  - pt is on ART . CD4 count of 20 - also on azithromycin and bactrim for PCP and MAC prophylaxis .monitor Qtc.  Continue mepron   NSVT Noted this am. Check EKG. k normal . Mg of 1.6. Will supplements. Monitor on tele  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 Bone marrow bx done at [facility] recently negative for lymphoma  Pancytopenia  - likely bone marrow suppression from HIV/AIDS and liver cirrhosis . No lymphoma seen on recent bone marrow bx -Worsened platelets this morning with elevated INR. No signs of bleeding. Monitor closely.   Depression  -Hold Lexapro  Liver cirrhosis  Hold Aldactone and Lasix.  AKI Secondary to dehydration. Resolved   DVT prophylaxis: SCD   Code Status: full code Family Communication: none at bedside Disposition Plan: home once improved   Consultants:  ID  DIET: full liquids  Procedures:  none  Antibiotics:  IV flagyl (6/1>>  IV zoeyn 6/5>>  HPI/Subjective: Patient still has persistent abdominal pain .NSVT this am on tele. Blood cx 2/2 from 6/5 growing GPC  Objective: Filed Vitals:   11/12/13 0558  BP: 109/57  Pulse: 96  Temp: 97.5 F (36.4 C)  Resp: 18    Intake/Output Summary (Last 24 hours) at 11/12/13 1134 Last data filed at 11/12/13 0845  Gross  per 24 hour  Intake   1265 ml  Output   1500 ml  Net   -235 ml   Filed Weights   11/06/13 0935 11/06/13 1722 11/06/13 2100  Weight: 99.791 kg (220 lb) 99.791 kg (220 lb) 92.352 kg (203 lb 9.6 oz)    Exam:   General:  Middle aged male  appears fatigued  HEENT: no pallor , dry oral mucosa   chest: clear b/l, no added sounds, rt sided port A cath  CVS: S1 and S2 tachycardic, no murmurs rub or gallop  Abdomen: Soft,diffuse tenderness,  BS+  Musculoskeletal: warm, 1+ edema  CNS: AAOX3  Data Reviewed: Basic Metabolic Panel:  Recent Labs Lab 11/06/13 1020 11/08/13 0940 11/09/13 1125 11/10/13 0505 11/11/13 0510 11/12/13 0415  NA 139 134* 132* 132* 132* 138  K 3.7 3.9 4.3 4.3 4.6 3.8  CL 108 107 104 105 104 107  CO2 20 14* 17* 19 18* 19  GLUCOSE 85 160* 99 84 64* 81  BUN 5* 11 26* 25* 34* 23  CREATININE 0.58 1.81* 1.86* 1.22 1.47* 0.90  CALCIUM 8.6 8.2* 7.8* 7.9* 7.9* 8.5  MG 1.7  --   --   --   --  1.6   Liver Function Tests:  Recent Labs Lab 11/06/13 1020 11/08/13 0940  AST 46* 52*  ALT 22 18  ALKPHOS 115 81  BILITOT 3.4* 1.8*  PROT 7.2 5.9*  ALBUMIN 2.7* 2.1*    Recent Labs Lab 11/06/13 1020  LIPASE 113*   No results found for this basename: AMMONIA,  in the last 168 hours CBC:  Recent Labs Lab 11/06/13 1020 11/08/13 0940 11/09/13 0540 11/10/13 1635 11/11/13 0510 11/12/13 0415  WBC 2.3* 1.0* 3.8* 2.6* 2.8* 4.4  NEUTROABS 1.2*  --   --   --   --   --   HGB 12.3* 11.6* 11.0* 9.8* 9.4* 10.4*  HCT 34.7* 33.0* 30.9* 28.0* 26.7* 29.8*  MCV 102.1* 103.4* 102.7* 103.3* 102.7* 101.7*  PLT 68* 60* 56* 45* 30* 48*   Cardiac Enzymes: No results found for this basename: CKTOTAL, CKMB, CKMBINDEX, TROPONINI,  in the last 168 hours BNP (last 3 results) No results found for this basename: PROBNP,  in the last 8760 hours CBG:  Recent Labs Lab 11/10/13 0853 11/10/13 0917 11/10/13 0928 11/10/13 1505  GLUCAP 61* 71 73 91    Recent Results (from the past 240 hour(s))  STOOL CULTURE     Status: None   Collection Time    11/08/13  2:37 AM      Result Value Ref Range Status   Specimen Description STOOL   Final   Special Requests NONE   Final   Culture     Final   Value: NO SUSPICIOUS  COLONIES, CONTINUING TO HOLD     Note: REDUCED NORMAL FLORA PRESENT     Performed at [facility]   Report Status PENDING   Incomplete  OVA AND PARASITE EXAMINATION     Status: None   Collection Time    11/08/13  2:37 AM      Result Value Ref Range Status   Specimen Description STOOL   Final   Special Requests NONE   Final   Ova and parasites     Final   Value: NO OVA OR PARASITES SEEN     Performed at [facility]   Report Status 11/09/2013 FINAL   Final  CLOSTRIDIUM DIFFICILE BY PCR     Status: None  Collection Time    11/08/13  2:37 AM      Result Value Ref Range Status   C difficile by pcr NEGATIVE  NEGATIVE Final  CULTURE, BLOOD (ROUTINE X 2)     Status: None   Collection Time    11/10/13  4:35 PM      Result Value Ref Range Status   Specimen Description BLOOD LEFT HAND   Final   Special Requests BOTTLES DRAWN AEROBIC ONLY 3CC   Final   Culture  Setup Time     Final   Value: 11/10/2013 22:39     Performed at [facility]   Culture     Final   Value: GRAM POSITIVE COCCI IN CHAINS     Note: Gram Stain Report Called to,Read Back By and Verified With: JILL MOORE 11/11/13 @ 2:40PM BY RUSCOE A.     Performed at [facility]   Report Status PENDING   Incomplete  CULTURE, BLOOD (ROUTINE X 2)     Status: None   Collection Time    11/10/13  4:50 PM      Result Value Ref Range Status   Specimen Description BLOOD LEFT ANTECUBITAL   Final   Special Requests BOTTLES DRAWN AEROBIC ONLY 10CC   Final   Culture  Setup Time     Final   Value: 11/10/2013 22:39     Performed at [facility]   Culture     Final   Value: GRAM POSITIVE COCCI IN CHAINS     Note: Gram Stain Report Called to,Read Back By and Verified With: JILL MOORE 11/11/13 @ 4:03PM BY RUSCOE A.     Performed at [facility]   Report Status PENDING   Incomplete  AFB CULTURE, BLOOD     Status: None   Collection Time    11/11/13  9:35 AM      Result Value Ref Range Status    Specimen Description BLOOD LEFT HAND   Final   Special Requests 5CC   Final   Culture     Final   Value: CULTURE WILL BE EXAMINED FOR 6 WEEKS BEFORE ISSUING A FINAL REPORT     Performed at [facility]   Report Status PENDING   Incomplete  CLOSTRIDIUM DIFFICILE BY PCR     Status: None   Collection Time    11/11/13  4:18 PM      Result Value Ref Range Status   C difficile by pcr NEGATIVE  NEGATIVE Final     Studies: Ct Head Wo Contrast  11/10/2013   CLINICAL DATA:  Persistent abdominal pain.  Dizziness.  Headache.  EXAM: CT HEAD WITHOUT CONTRAST  TECHNIQUE: Contiguous axial images were obtained from the base of the skull through the vertex without intravenous contrast.  COMPARISON:  No priors.  FINDINGS: No acute intracranial abnormalities. Specifically, no evidence of acute intracranial hemorrhage, no definite findings of acute/subacute cerebral ischemia, no mass, mass effect, hydrocephalus or abnormal intra or extra-axial fluid collections. Visualized paranasal sinuses and mastoids are well pneumatized. No acute displaced skull fractures are identified.  IMPRESSION: *No acute intracranial abnormalities. *The appearance of the brain is normal.   Electronically Signed   By: Vinnie Langton M.D.   On: 11/10/2013 19:43   Ct Abdomen Pelvis W Contrast  11/10/2013   CLINICAL DATA:  Persistent abdominal pain.  EXAM: CT ABDOMEN AND PELVIS WITH CONTRAST  TECHNIQUE: Multidetector CT imaging of the abdomen and pelvis was performed using  the standard protocol following bolus administration of intravenous contrast.  CONTRAST:  174mL OMNIPAQUE IOHEXOL 300 MG/ML  SOLN  COMPARISON:  CT of the abdomen and pelvis 10/22/2013.  FINDINGS: Lung Bases: Linear opacities throughout the lung bases bilaterally, predominantly in a peribronchovascular distribution, favored to represent areas of peribronchial inflammation, potentially related to recent recurrent bouts of aspiration. Markedly thickened distal esophagus.  Numerous serpiginous structures adjacent to the distal esophagus, presumably esophageal varices. Tip of a porta cath in the right atrium.  Abdomen/Pelvis: The liver has a shrunken appearance and nodular contour, compatible with advanced cirrhosis. No definite focal hepatic lesions are noted on today's limited noncontrast CT examination. Numerous surgical clips are noted adjacent to the liver. Status post cholecystectomy. Potential stone or migrated surgical clip in the proximal aspect of the common bile duct demonstrated on images 19 and 20 of series 2, unchanged. The portal vein is dilated measuring 18 mm. The spleen is massively enlarged measuring 18.6 x 14.1 x 17.7 cm (estimated splenic volume of 2,321 mL).  New compared to the recent prior examination there is marked thickening of the colonic wall involving the cecum and ascending colon. These findings are concerning for colitis. More distal aspect of the colon appears uninvolved. No significant volume of ascites. No pneumoperitoneum. No pathologic distention of small bowel. No definite lymphadenopathy identified within the abdomen or pelvis on today's non contrast CT examination. Prostate gland and urinary bladder are unremarkable in appearance. Small umbilical hernia containing only omental fat incidentally noted (unchanged).  Musculoskeletal: Multifocal sclerotic lesions are noted throughout the visualized axial and appendicular skeleton, most apparent within the sacrum at the level of S1, in the right side of the L5 vertebral body, and scattered throughout the remainder of the spine. These findings are similar to prior examinations. At T10 and T11 there is mild compression which is unchanged, with approximately 15-20% loss of height anteriorly at both vertebral body levels. Edema throughout the subcutaneous fat of the flanks bilaterally (right greater than left).  IMPRESSION: 1. Interval development of extensive colonic wall thickening involving the cecum  and ascending colon, concerning for colitis. Clinical correlation is recommended. 2. Stigmata of cirrhosis and portal hypertension, including massive splenomegaly and esophageal varices redemonstrated, as above. 3. The appearance of the lung bases suggest sequela of multiple recent bouts of aspiration. 4. Small umbilical hernia containing only omental fat incidentally noted. 5. 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 6. Additional incidental findings, as above.   Electronically Signed   By: Vinnie Langton M.D.   On: 11/10/2013 19:56    Scheduled Meds: . atovaquone  1,500 mg Oral Daily  . azithromycin  1,200 mg Oral Weekly  . cefTRIAXone (ROCEPHIN)  IV  2 g Intravenous Q24H  . Darunavir Ethanolate  800 mg Oral Q breakfast  . diphenoxylate-atropine  2 tablet Oral TID  . emtricitabine-tenofovir  1 tablet Oral QHS  . fluconazole (DIFLUCAN) IV  100 mg Intravenous Q24H  . metroNIDAZOLE  500 mg Oral 3 times per day  . morphine  15 mg Oral BID  . pantoprazole  40 mg Oral BID  . ritonavir  100 mg Oral Q breakfast  . sucralfate  1 g Oral TID WC  . vancomycin  1,000 mg Intravenous Q12H   Continuous Infusions: . dextrose 5 % and 0.9% NaCl 100 mL/hr at 11/11/13 0930      Time spent: 35 minutes  Lenor Provencher  [facility] Pager [phone]. If 7PM-7AM, please contact night-coverage at [email or web address], password [facility] 11/12/2013, 11:34 AM  LOS: 6 days

## 2013-11-13 ENCOUNTER — Encounter (HOSPITAL_COMMUNITY)
Admission: EM | Disposition: A | Discharge status: Skilled Nursing Facility | Payer: Self-pay | Source: Home / Self Care | Attending: Internal Medicine

## 2013-11-13 ENCOUNTER — Encounter (HOSPITAL_COMMUNITY): Payer: Self-pay

## 2013-11-13 HISTORY — PX: TEE WITHOUT CARDIOVERSION: SHX5443

## 2013-11-13 LAB — HIV-1 RNA QUANT-NO REFLEX-BLD
HIV 1 RNA Quant: 178 copies/mL — ABNORMAL HIGH (ref <20)
HIV-1 RNA Quant, Log: 2.25 {Log} — ABNORMAL HIGH (ref <1.30)

## 2013-11-13 LAB — PROTIME-INR
INR: 2.02 — ABNORMAL HIGH (ref 0.00–1.49)
Prothrombin Time: 22.2 seconds — ABNORMAL HIGH (ref 11.6–15.2)

## 2013-11-13 LAB — STOOL CULTURE

## 2013-11-13 LAB — URINE CULTURE
Colony Count: NO GROWTH
Culture: NO GROWTH

## 2013-11-13 LAB — CBC
HEMATOCRIT: 24.4 % — AB (ref 39.0–52.0)
Hemoglobin: 8.6 g/dL — ABNORMAL LOW (ref 13.0–17.0)
MCH: 35.7 pg — ABNORMAL HIGH (ref 26.0–34.0)
MCHC: 35.2 g/dL (ref 30.0–36.0)
MCV: 101.2 fL — AB (ref 78.0–100.0)
Platelets: 31 10*3/uL — ABNORMAL LOW (ref 150–400)
RBC: 2.41 MIL/uL — ABNORMAL LOW (ref 4.22–5.81)
RDW: 17.7 % — AB (ref 11.5–15.5)
WBC: 2.8 10*3/uL — ABNORMAL LOW (ref 4.0–10.5)

## 2013-11-13 SURGERY — ECHOCARDIOGRAM, TRANSESOPHAGEAL
Anesthesia: Moderate Sedation

## 2013-11-13 MED ORDER — SODIUM CHLORIDE 0.9 % IV SOLN
2.0000 g | INTRAVENOUS | Status: DC
  Administered 2013-11-13 – 2013-11-15 (×10): 2 g via INTRAVENOUS
  Filled 2013-11-13 (×14): qty 2000

## 2013-11-13 MED ORDER — DIPHENHYDRAMINE HCL 50 MG/ML IJ SOLN
INTRAMUSCULAR | Status: AC
  Filled 2013-11-13: qty 1

## 2013-11-13 MED ORDER — MIDAZOLAM HCL 5 MG/ML IJ SOLN
INTRAMUSCULAR | Status: AC
  Filled 2013-11-13: qty 2

## 2013-11-13 MED ORDER — FUROSEMIDE 20 MG PO TABS
20.0000 mg | ORAL_TABLET | Freq: Every day | ORAL | Status: DC
  Administered 2013-11-13 – 2013-11-14 (×2): 20 mg via ORAL
  Administered 2013-11-15: 11:00:00 via ORAL
  Administered 2013-11-17 – 2013-11-18 (×2): 20 mg via ORAL
  Filled 2013-11-13 (×7): qty 1

## 2013-11-13 MED ORDER — SODIUM CHLORIDE 0.9 % IV SOLN: INTRAVENOUS | Status: DC

## 2013-11-13 MED ORDER — BUTAMBEN-TETRACAINE-BENZOCAINE 2-2-14 % EX AERO
INHALATION_SPRAY | CUTANEOUS | Status: DC | PRN
  Administered 2013-11-13: 1 via TOPICAL

## 2013-11-13 MED ORDER — FENTANYL CITRATE 0.05 MG/ML IJ SOLN
INTRAMUSCULAR | Status: DC | PRN
  Administered 2013-11-13 (×2): 25 ug via INTRAVENOUS

## 2013-11-13 MED ORDER — SPIRONOLACTONE 50 MG PO TABS
50.0000 mg | ORAL_TABLET | Freq: Every day | ORAL | Status: DC
  Administered 2013-11-13 – 2013-11-19 (×6): 50 mg via ORAL
  Filled 2013-11-13 (×7): qty 1

## 2013-11-13 MED ORDER — FENTANYL CITRATE 0.05 MG/ML IJ SOLN
INTRAMUSCULAR | Status: AC
  Filled 2013-11-13: qty 2

## 2013-11-13 MED ORDER — MIDAZOLAM HCL 10 MG/2ML IJ SOLN
INTRAMUSCULAR | Status: DC | PRN
  Administered 2013-11-13 (×2): 2 mg via INTRAVENOUS

## 2013-11-13 MED ORDER — NYSTATIN 100000 UNIT/ML MT SUSP
5.0000 mL | Freq: Four times a day (QID) | OROMUCOSAL | Status: DC
  Administered 2013-11-13 – 2013-11-25 (×44): 500000 [IU] via ORAL
  Filled 2013-11-13 (×51): qty 5

## 2013-11-13 NOTE — Interval H&P Note (Signed)
History and Physical Interval Note:  11/13/2013 12:08 PM  Samuel Schultz  has presented today for surgery, with the diagnosis of r/o endocarditis   The various methods of treatment have been discussed with the patient and family. After consideration of risks, benefits and other options for treatment, the patient has consented to  Procedure(s): TRANSESOPHAGEAL ECHOCARDIOGRAM (TEE) (N/A) as a surgical intervention .  The patient's history has been reviewed, patient examined, no change in status, stable for surgery.  I have reviewed the patient's chart and labs.  Questions were answered to the patient's satisfaction.     Dorothy Spark

## 2013-11-13 NOTE — Progress Notes (Signed)
Pt was ordered 3 units of FFP. First unit was initiated and then pt was picked up and taken to Endoscopy for TEE.  Pt is scheduled for porta cath removal at 2 pm and was supposed to get the 3 units prior to the procedure. Talked with Endo charge nurse who stated they are not able to hang/transfuse FFP while pt down there, so pt is not able to get the remaining 2 units of FFP. Radiology PA, Jannifer Franklin was notified of the FFP that has not been administered. Nonie Hoyer stated she would talk to the MD's involved and would notify this writer of what action we may need to do or if the procedure is going to be rescheduled.

## 2013-11-13 NOTE — Progress Notes (Signed)
[facility] PROGRESS NOTE  Samuel Schultz MWN:[id number] DOB: [birth year] DOA: 11/06/2013 PCP: PROVIDER NOT IN SYSTEM   Brief narrative 38 year old male with past medical history of HIV/AIDS on HAART (last CD4 10/18/2013 20), large cell lymphoma with liver infiltration (treawted with R-[facility] in 2010-2012 (followed at [facility]), recent admission for colitis (transferred to [facility] on 10/22/2013) where he had full w/up including negative stool studies, cx , negative colonoscopy and a bone marrow bx showing hypercellular marrow without finidings of lymphoma or leukemia. He showed improvement in symptoms there an was discharged off abx on 5/22. He then  presented to [facility] ED 11/06/2013 with ongoing fatigue, weakness, diarrhea. He was transferred to [facility] for further care per GI (Dr. Dudley Major in [facility]) recommendations as he thought that the pt requires higher level of care.  Pt was started on flagyl for treatment of possible enteritis. CT abd done showed wall thickening throughout colon. Hospital course prolonged due to persistent worsening colitis and bacteremia.  Assessment/Plan: Persistent colitis -possibly related to AIDS given recurrent symptoms for over 3 weeks - repeat stool for C.diff negative, stool culture, ova and parasites negative. Diarrhea has somewhat improved on Lomotil but still has abdominal pain.-Flagyl discontinued on 6/5 but was restarted even worsened colitis seen on repeat CT scan..  -Patient continues to have diarrhea although frequency had reduced in last 48 hours. No nausea or vomiting but has persistent abdominal pain. Lactic acid normalized. Zosyn switched to Rocephin given bacteremia. -IV hydration and clear liquid diet. GI consulted. No further workup recommended recent negative workups including studies and CMV culture from colon biopsy.  Active Problems:  GPC bacteremia On 6/5 patient was hypotensive and hypoglycemic. Repeat CT scan of the abdomen showed worsening colitis. Blood culture 2/2  on 6/5 growing enterococcus. Placed on IV Rocephin. Continue vancomycin and Flagyl. Patient has a right-sided Port-A-Cath for is a lymphoma placed few years back . IR was removed with in a.m. Ordered 3 units of FFP and platelet transfusion. The patient underwent TEE this afternoon which showed a normal visitation of thrombus. 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 -Will followup with blood culture sensitivity and completion of antibiotics.  HIV/AIDS on HAART  - appreciate ID consult and recommendations  - pt is on ART . CD4 count of 20 - also on azithromycin and bactrim for PCP and MAC prophylaxis .monitor Qtc.  Continue mepron   NSVT on 6/7 No magnesium corrected. EKG within normal limits. No further episodes.  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 Bone marrow bx done at [facility] recently negative for lymphoma  Pancytopenia  - likely bone marrow suppression from HIV/AIDS and liver cirrhosis . 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 Has thrombocytopenia and coagulopathy. FFP ordered to correct INR. Platelets will be transfused in a.m.   Depression  -Hold Lexapro  Liver cirrhosis  Resume Aldactone and Lasix.  AKI Secondary to dehydration. Resolved  Oral thrush On IV fluconazole. We'll add nystatin swish and swallow   DVT prophylaxis: SCD   Code Status: full code Family Communication: none at bedside Disposition Plan: home once improved   Consultants:  ID  DIET: full liquids  Procedures:  none  Antibiotics:  IV flagyl (6/1>>  IV zoeyn 6/5>>  HPI/Subjective: Patient still has persistent abdominal pain .NSVT this am on tele. Blood cx 2/2 from 6/5 growing GPC  Objective: Filed Vitals:   11/13/13 1312  BP: 109/56  Pulse: 88  Temp: 98.4 F (36.9 C)  Resp: 24    Intake/Output Summary (Last  24 hours) at 11/13/13 1401 Last data filed at 11/13/13 1214  Gross per 24 hour  Intake   1337 ml  Output    900 ml  Net    437 ml   Filed Weights   11/06/13 0935 11/06/13 1722 11/06/13 2100  Weight: 99.791 kg (220 lb) 99.791 kg (220 lb) 92.352 kg (203 lb 9.6 oz)    Exam:   General:  Middle aged male appears fatigued  HEENT: no pallor , dry oral mucosa   chest: clear b/l, no added sounds, rt sided port A cath  CVS: S1 and S2 tachycardic, no murmurs rub or gallop  Abdomen: Soft,diffuse tenderness,  BS+  Musculoskeletal: warm, 1+ edema  CNS: AAOX3  Data Reviewed: Basic Metabolic Panel:  Recent Labs Lab 11/08/13 0940 11/09/13 1125 11/10/13 0505 11/11/13 0510 11/12/13 0415  NA 134* 132* 132* 132* 138  K 3.9 4.3 4.3 4.6 3.8  CL 107 104 105 104 107  CO2 14* 17* 19 18* 19  GLUCOSE 160* 99 84 64* 81  BUN 11 26* 25* 34* 23  CREATININE 1.81* 1.86* 1.22 1.47* 0.90  CALCIUM 8.2* 7.8* 7.9* 7.9* 8.5  MG  --   --   --   --  1.6   Liver Function Tests:  Recent Labs Lab 11/08/13 0940  AST 52*  ALT 18  ALKPHOS 81  BILITOT 1.8*  PROT 5.9*  ALBUMIN 2.1*   No results found for this basename: LIPASE, AMYLASE,  in the last 168 hours No results found for this basename: AMMONIA,  in the last 168 hours CBC:  Recent Labs Lab 11/09/13 0540 11/10/13 1635 11/11/13 0510 11/12/13 0415 11/13/13 0520  WBC 3.8* 2.6* 2.8* 4.4 2.8*  HGB 11.0* 9.8* 9.4* 10.4* 8.6*  HCT 30.9* 28.0* 26.7* 29.8* 24.4*  MCV 102.7* 103.3* 102.7* 101.7* 101.2*  PLT 56* 45* 30* 48* 31*   Cardiac Enzymes: No results found for this basename: CKTOTAL, CKMB, CKMBINDEX, TROPONINI,  in the last 168 hours BNP (last 3 results) No results found for this basename: PROBNP,  in the last 8760 hours CBG:  Recent Labs Lab 11/10/13 0853 11/10/13 0917 11/10/13 0928 11/10/13 1505  GLUCAP 61* 71 73 91    Recent Results (from the past 240 hour(s))  STOOL CULTURE     Status: None   Collection Time     11/08/13  2:37 AM      Result Value Ref Range Status   Specimen Description STOOL   Final   Special Requests NONE   Final   Culture     Final   Value: NO SALMONELLA, SHIGELLA, CAMPYLOBACTER, YERSINIA, OR E.COLI 0157:H7 ISOLATED     Note: REDUCED NORMAL FLORA PRESENT     Performed at [facility]   Report Status 11/13/2013 FINAL   Final  OVA AND PARASITE EXAMINATION     Status: None   Collection Time    11/08/13  2:37 AM      Result Value Ref Range Status   Specimen Description STOOL   Final   Special Requests NONE   Final   Ova and parasites     Final   Value: NO OVA OR PARASITES SEEN     Performed at [facility]   Report Status 11/09/2013 FINAL   Final  CLOSTRIDIUM DIFFICILE BY PCR  Status: None   Collection Time    11/08/13  2:37 AM      Result Value Ref Range Status   C difficile by pcr NEGATIVE  NEGATIVE Final  CULTURE, BLOOD (ROUTINE X 2)     Status: None   Collection Time    11/10/13  4:35 PM      Result Value Ref Range Status   Specimen Description BLOOD LEFT HAND   Final   Special Requests BOTTLES DRAWN AEROBIC ONLY 3CC   Final   Culture  Setup Time     Final   Value: 11/10/2013 22:39     Performed at [facility]   Culture     Final   Value: GRAM POSITIVE COCCI IN CHAINS     Note: Gram Stain Report Called to,Read Back By and Verified With: JILL MOORE 11/11/13 @ 2:40PM BY RUSCOE A.     Performed at [facility]   Report Status PENDING   Incomplete  CULTURE, BLOOD (ROUTINE X 2)     Status: None   Collection Time    11/10/13  4:50 PM      Result Value Ref Range Status   Specimen Description BLOOD LEFT ANTECUBITAL   Final   Special Requests BOTTLES DRAWN AEROBIC ONLY 10CC   Final   Culture  Setup Time     Final   Value: 11/10/2013 22:39     Performed at [facility]   Culture     Final   Value: ENTEROCOCCUS SPECIES     Note: Gram Stain Report Called to,Read Back By and Verified With: JILL MOORE 11/11/13 @ 4:03PM BY RUSCOE  A.     Performed at [facility]   Report Status PENDING   Incomplete  AFB CULTURE, BLOOD     Status: None   Collection Time    11/11/13  9:35 AM      Result Value Ref Range Status   Specimen Description BLOOD LEFT HAND   Final   Special Requests 5CC   Final   Culture     Final   Value: CULTURE WILL BE EXAMINED FOR 6 WEEKS BEFORE ISSUING A FINAL REPORT     Performed at [facility]   Report Status PENDING   Incomplete  CLOSTRIDIUM DIFFICILE BY PCR     Status: None   Collection Time    11/11/13  4:18 PM      Result Value Ref Range Status   C difficile by pcr NEGATIVE  NEGATIVE Final  CULTURE, BLOOD (ROUTINE X 2)     Status: None   Collection Time    11/11/13  8:25 PM      Result Value Ref Range Status   Specimen Description BLOOD RIGHT ARM   Final   Special Requests BOTTLES DRAWN AEROBIC AND ANAEROBIC [facility] EACH   Final   Culture  Setup Time     Final   Value: 11/12/2013 01:05     Performed at [facility]   Culture     Final   Value: GRAM POSITIVE COCCI IN CHAINS     Note: Gram Stain Report Called to,Read Back By and Verified With: FRANCIS PLEASANT 11/12/13 @ 8:25PM BY RUSCOE A.     Performed at [facility]   Report Status PENDING   Incomplete  CULTURE, BLOOD (ROUTINE X 2)     Status: None   Collection Time    11/11/13  8:35 PM  Result Value Ref Range Status   Specimen Description BLOOD LEFT HAND   Final   Special Requests BOTTLES DRAWN AEROBIC ONLY 1CC   Final   Culture  Setup Time     Final   Value: 11/12/2013 01:05     Performed at [facility]   Culture     Final   Value:        BLOOD CULTURE RECEIVED NO GROWTH TO DATE CULTURE WILL BE HELD FOR 5 DAYS BEFORE ISSUING A FINAL NEGATIVE REPORT     Performed at [facility]   Report Status PENDING   Incomplete     Studies: Dg Abd 1 View  11/12/2013   CLINICAL DATA:  Colitis.  EXAM: ABDOMEN - 1 VIEW  COMPARISON:  CT of the abdomen and pelvis 11/10/2013.  FINDINGS: Oral  contrast material is noted throughout the colon and distal rectum. No pathologic distention of small bowel. No pneumoperitoneum. Multiple surgical clips project over the right upper quadrant of the abdomen. Splenic contour appears enlarged.  IMPRESSION: 1. Nonobstructive bowel gas pattern. 2. No pneumoperitoneum. 3. Splenomegaly.   Electronically Signed   By: Vinnie Langton M.D.   On: 11/12/2013 11:47    Scheduled Meds: . atovaquone  1,500 mg Oral Daily  . azithromycin  1,200 mg Oral Weekly  . cefTRIAXone (ROCEPHIN)  IV  2 g Intravenous Q24H  . Darunavir Ethanolate  800 mg Oral Q breakfast  . diphenoxylate-atropine  2 tablet Oral TID  . emtricitabine-tenofovir  1 tablet Oral QHS  . fluconazole (DIFLUCAN) IV  100 mg Intravenous Q24H  . metroNIDAZOLE  500 mg Oral 3 times per day  . morphine  15 mg Oral BID  . pantoprazole  40 mg Oral BID  . ritonavir  100 mg Oral Q breakfast  . sucralfate  1 g Oral TID WC  . vancomycin  1,000 mg Intravenous Q12H   Continuous Infusions: . dextrose 5 % and 0.9% NaCl 100 mL/hr at 11/13/13 1351      Time spent: 35 minutes    Jelissa Espiritu  [facility] Pager [phone]. If 7PM-7AM, please contact night-coverage at [email or web address], password [facility] 11/13/2013, 2:01 PM  LOS: 7 days

## 2013-11-13 NOTE — Progress Notes (Signed)
  Echocardiogram Echocardiogram Transesophageal has been performed.  Samuel Schultz 11/13/2013, 12:47 PM

## 2013-11-13 NOTE — Progress Notes (Signed)
Doctor Dhungel wants remaining two units of FFP to be hung tomorrow morning at 5 am.

## 2013-11-13 NOTE — Progress Notes (Signed)
Unassigned patient Subjective: Seems to be somewhat sedated after the procedure but still complaining of abdominal pain 9/10 in intensity. I BM today ,  Objective: Vital signs in last 24 hours: Temp:  [98.1 F (36.7 C)-99.2 F (37.3 C)] 98.3 F (36.8 C) (06/08 1738) Pulse Rate:  [87-98] 88 (06/08 1738) Resp:  [16-30] 20 (06/08 1738) BP: (104-132)/(50-86) 121/58 mmHg (06/08 1738) SpO2:  [93 %-100 %] 95 % (06/08 1738) Last BM Date: 11/11/13  Intake/Output from previous day: 06/07 0701 - 06/08 0700 In: 4120 [P.O.:360; I.V.:2365; Blood:895; IV Piggyback:500] Out: 1200 [Urine:1200] Intake/Output this shift:   General appearance: appears older than stated age, distracted, fatigued, mild distress, moderately obese, pale and slowed mentation Resp: clear to auscultation bilaterally Cardio: regular rate and rhythm, S1, S2 normal, no murmur, click, rub or gallop GI: soft, diffusely tender; bowel sounds normal; no masses,  no organomegaly Extremities: extremities normal, atraumatic, no cyanosis or edema  Lab Results:  Recent Labs  11/11/13 0510 11/12/13 0415 11/13/13 0520  WBC 2.8* 4.4 2.8*  HGB 9.4* 10.4* 8.6*  HCT 26.7* 29.8* 24.4*  PLT 30* 48* 31*   BMET  Recent Labs  11/11/13 0510 11/12/13 0415  NA 132* 138  K 4.6 3.8  CL 104 107  CO2 18* 19  GLUCOSE 64* 81  BUN 34* 23  CREATININE 1.47* 0.90  CALCIUM 7.9* 8.5   LFT No results found for this basename: PROT, ALBUMIN, AST, ALT, ALKPHOS, BILITOT, BILIDIR, IBILI,  in the last 72 hours PT/INR  Recent Labs  11/12/13 0415 11/13/13 0520  LABPROT 24.0* 22.2*  INR 2.23* 2.02*   Studies/Results: Dg Abd 1 View  11/12/2013   CLINICAL DATA:  Colitis.  EXAM: ABDOMEN - 1 VIEW  COMPARISON:  CT of the abdomen and pelvis 11/10/2013.  FINDINGS: Oral contrast material is noted throughout the colon and distal rectum. No pathologic distention of small bowel. No pneumoperitoneum. Multiple surgical clips project over the right upper  quadrant of the abdomen. Splenic contour appears enlarged.  IMPRESSION: 1. Nonobstructive bowel gas pattern. 2. No pneumoperitoneum. 3. Splenomegaly.   Electronically Signed   By: Vinnie Langton M.D.   On: 11/12/2013 11:47    Medications: I have reviewed the patient's current medications.  Assessment/Plan: 1) Anemia/Colitis on CT in the setting of HIV/AIDS; seems to have improved on antibiotics. Continue present care.   LOS: 7 days   Juanita Craver 11/13/2013, 8:28 PM

## 2013-11-13 NOTE — CV Procedure (Signed)
     Transesophageal Echocardiogram Note  Samuel Schultz [id number] 19-Oct-1975  Procedure: Transesophageal Echocardiogram Indications: enterococcal bacteremia  Procedure Details Consent: Obtained Time Out: Verified patient identification, verified procedure, site/side was marked, verified correct patient position, special equipment/implants available, Radiology Safety Procedures followed,  medications/allergies/relevent history reviewed, required imaging and test results available.  Performed  Medications: Fentanyl: 50 mcg  Versed: 4 mg   Left Ventrical:  Normal left ventricular size and function, estimated LVEF 55-60%.  Mitral Valve: Structurally normal, mild MR, no vegetation.   Aortic Valve: Structurally normal, no AI, no vegetation.   Tricuspid Valve: Structurally normal, mild TR, no vegetation.   Pulmonic Valve: Structurally normal, no PR, no vegetation.   Left Atrium/ Left atrial appendage: No thrombus in the LA or LAA.   Right atrium: A line seen in the SVC and right atrium free of masses or mobile echodensities.   Atrial septum: Aneurysmal interatrial septum, positive PFO by color Doppler and bubble study.  Aorta: No AS plague seen.   No intracardiac vegetation was seen.   Complications: No apparent complications Patient did tolerate procedure well.  Dorothy Spark, MD, [facility] 11/13/2013, 12:09 PM

## 2013-11-13 NOTE — H&P (Signed)
Will need further reversal of coagulopathy prior to port removal.  Also needs one unit platelets just prior to procedure.

## 2013-11-13 NOTE — Progress Notes (Signed)
Patient ID: Samuel Schultz, male   DOB: 03/22/1976, 38 y.o.   MRN: [id number]   PAC removal has been rescheduled to 6/9 am in IR  Pt still in endo at 1245 pm today Has only had 1 unit FFP as of yet (still has 2 units ordered) Ea take over 1 hr to infuse IR cannot start procedure that late in afternoon We must reschedule for 6/9  RN to contact Dr Clementeen Graham Keep FFP for am?  I have re ordered stat INR for 9am 6/9 Plts on call to xray 6/9 Plan for am procedure

## 2013-11-13 NOTE — Progress Notes (Signed)
Agree.  Will plan for PAC removal in AM.

## 2013-11-13 NOTE — Progress Notes (Signed)
Received call from Monia Sabal, [place] who stated pt's procedure is going to be rescheduled for tomorrow.

## 2013-11-13 NOTE — Progress Notes (Addendum)
Patient ID: Samuel Schultz, male   DOB: 28-Feb-1976, 38 y.o.   MRN: [id number]   Pt tentatively scheduled for PAC removal today INR 2.20 today (2.23)  Case has been reviewed with Dr Kathlene Cote INR will need to be 1.7 to safely move forward Will touch base with [facility] MD.  If INR comes down we can move forward today and would hang Platelets for procedure. Or Recheck INR in am  Will discuss with Dr Clementeen Graham  Addendum: Discussed with Dr Clementeen Graham; will transfuse FFP now per his order Recheck INR at 130 pm stat today If INR 1.7 we will call for pt and hang 1 unit plts in Xray  plts ordered.

## 2013-11-13 NOTE — Progress Notes (Signed)
Agree 

## 2013-11-13 NOTE — H&P (View-Only) (Signed)
[facility]  Ceftriaxone day # 2 Flagyl day # 7  Subjective: still having diarrhea    Antibiotics:  Anti-infectives   Start     Dose/Rate Route Frequency Ordered Stop   11/11/13 1830  cefTRIAXone (ROCEPHIN) 2 g in dextrose 5 % 50 mL IVPB     2 g 100 mL/hr over 30 Minutes Intravenous Every 24 hours 11/11/13 1744     11/11/13 1745  metroNIDAZOLE (FLAGYL) tablet 500 mg     500 mg Oral 3 times per day 11/11/13 1744     11/11/13 1600  vancomycin (VANCOCIN) IVPB 1000 mg/200 mL premix     1,000 mg 200 mL/hr over 60 Minutes Intravenous Every 12 hours 11/11/13 1555     11/11/13 1000  fluconazole (DIFLUCAN) IVPB 100 mg     100 mg 50 mL/hr over 60 Minutes Intravenous Every 24 hours 11/11/13 0907     11/11/13 0815  metroNIDAZOLE (FLAGYL) IVPB 500 mg  Status:  Discontinued     500 mg 100 mL/hr over 60 Minutes Intravenous Every 8 hours 11/11/13 0801 11/11/13 1144   11/10/13 2145  piperacillin-tazobactam (ZOSYN) IVPB 3.375 g  Status:  Discontinued     3.375 g 12.5 mL/hr over 240 Minutes Intravenous Every 8 hours 11/10/13 2133 11/11/13 1744   11/10/13 1000  azithromycin (ZITHROMAX) tablet 1,200 mg     1,200 mg Oral Weekly 11/07/13 1141     11/10/13 0800  Darunavir Ethanolate (PREZISTA) tablet 800 mg     800 mg Oral Daily with breakfast 11/09/13 1320     11/07/13 1150  sulfamethoxazole-trimethoprim (BACTRIM DS) 800-160 MG per tablet 1 tablet  Status:  Discontinued     1 tablet Oral Daily 11/07/13 1150 11/08/13 1729   11/07/13 0800  ritonavir (NORVIR) capsule 100 mg     100 mg Oral Daily with breakfast 11/06/13 1701     11/06/13 2200  emtricitabine-tenofovir (TRUVADA) 200-300 MG per tablet 1 tablet     1 tablet Oral Daily at bedtime 11/06/13 1701     11/06/13 1715  atovaquone (MEPRON) 750 MG/5ML suspension 1,500 mg     1,500 mg Oral Daily 11/06/13 1701     11/06/13 1715  Darunavir Ethanolate (PREZISTA) tablet 800 mg  Status:  Discontinued     800 mg Oral Daily  11/06/13 1701 11/09/13 1320   11/06/13 1715  metroNIDAZOLE (FLAGYL) IVPB 500 mg  Status:  Discontinued     500 mg 100 mL/hr over 60 Minutes Intravenous Every 8 hours 11/06/13 1710 11/10/13 1511      Medications: Scheduled Meds: . atovaquone  1,500 mg Oral Daily  . azithromycin  1,200 mg Oral Weekly  . cefTRIAXone (ROCEPHIN)  IV  2 g Intravenous Q24H  . Darunavir Ethanolate  800 mg Oral Q breakfast  . diphenoxylate-atropine  2 tablet Oral TID  . emtricitabine-tenofovir  1 tablet Oral QHS  . fluconazole (DIFLUCAN) IV  100 mg Intravenous Q24H  . metroNIDAZOLE  500 mg Oral 3 times per day  . morphine  15 mg Oral BID  . pantoprazole  40 mg Oral BID  . ritonavir  100 mg Oral Q breakfast  . sucralfate  1 g Oral TID WC  . vancomycin  1,000 mg Intravenous Q12H   Continuous Infusions: . dextrose 5 % and 0.9% NaCl 100 mL/hr at 11/12/13 1834   PRN Meds:.acetaminophen, acetaminophen, alum & mag hydroxide-simeth, chlorproMAZINE, HYDROmorphone (DILAUDID) injection, hyoscyamine, promethazine, sodium chloride    Objective: Weight change:  Intake/Output Summary (Last 24 hours) at 11/12/13 2047 Last data filed at 11/12/13 1856  Gross per 24 hour  Intake   3225 ml  Output   2100 ml  Net   1125 ml   Blood pressure 110/82, pulse 89, temperature 98.2 F (36.8 C), temperature source Oral, resp. rate 18, height 5\' 9"  (1.753 m), weight 203 lb 9.6 oz (92.352 kg), SpO2 94.00%. Temp:  [97.5 F (36.4 C)-98.4 F (36.9 C)] 98.2 F (36.8 C) (06/07 1310) Pulse Rate:  [89-98] 89 (06/07 1310) Resp:  [18-19] 18 (06/07 1310) BP: (106-110)/(57-82) 110/82 mmHg (06/07 1310) SpO2:  [93 %-94 %] 94 % (06/07 1310)  Physical Exam: General: Alert and awake, oriented x3, HEENT: EOMI CVS regular rate, normal r,  no murmur rubs or gallops Chest: clear to auscultation bilaterally, no wheezing, rales or rhonchi Abdomen: soft tender diffusely distended Neuro: nonfocal  CBC:  Recent Labs Lab 11/08/13 0940  11/09/13 0540 11/10/13 1635 11/11/13 0510 11/11/13 0935 11/12/13 0415  HGB 11.6* 11.0* 9.8* 9.4*  --  10.4*  HCT 33.0* 30.9* 28.0* 26.7*  --  29.8*  PLT 60* 56* 45* 30*  --  48*  INR  --   --   --   --  2.61* 2.23*     BMET  Recent Labs  11/11/13 0510 11/12/13 0415  NA 132* 138  K 4.6 3.8  CL 104 107  CO2 18* 19  GLUCOSE 64* 81  BUN 34* 23  CREATININE 1.47* 0.90  CALCIUM 7.9* 8.5     Liver Panel  No results found for this basename: PROT, ALBUMIN, AST, ALT, ALKPHOS, BILITOT, BILIDIR, IBILI,  in the last 72 hours     Sedimentation Rate No results found for this basename: ESRSEDRATE,  in the last 72 hours C-Reactive Protein No results found for this basename: CRP,  in the last 72 hours  Micro Results: Recent Results (from the past 240 hour(s))  STOOL CULTURE     Status: None   Collection Time    11/08/13  2:37 AM      Result Value Ref Range Status   Specimen Description STOOL   Final   Special Requests NONE   Final   Culture     Final   Value: NO SUSPICIOUS COLONIES, CONTINUING TO HOLD     Note: REDUCED NORMAL FLORA PRESENT     Performed at [facility]   Report Status PENDING   Incomplete  OVA AND PARASITE EXAMINATION     Status: None   Collection Time    11/08/13  2:37 AM      Result Value Ref Range Status   Specimen Description STOOL   Final   Special Requests NONE   Final   Ova and parasites     Final   Value: NO OVA OR PARASITES SEEN     Performed at [facility]   Report Status 11/09/2013 FINAL   Final  CLOSTRIDIUM DIFFICILE BY PCR     Status: None   Collection Time    11/08/13  2:37 AM      Result Value Ref Range Status   C difficile by pcr NEGATIVE  NEGATIVE Final  CULTURE, BLOOD (ROUTINE X 2)     Status: None   Collection Time    11/10/13  4:35 PM      Result Value Ref Range Status   Specimen Description BLOOD LEFT HAND   Final   Special Requests BOTTLES DRAWN AEROBIC ONLY 3CC  Final   Culture  Setup Time     Final    Value: 11/10/2013 22:39     Performed at [facility]   Culture     Final   Value: GRAM POSITIVE COCCI IN CHAINS     Note: Gram Stain Report Called to,Read Back By and Verified With: JILL MOORE 11/11/13 @ 2:40PM BY RUSCOE A.     Performed at [facility]   Report Status PENDING   Incomplete  CULTURE, BLOOD (ROUTINE X 2)     Status: None   Collection Time    11/10/13  4:50 PM      Result Value Ref Range Status   Specimen Description BLOOD LEFT ANTECUBITAL   Final   Special Requests BOTTLES DRAWN AEROBIC ONLY 10CC   Final   Culture  Setup Time     Final   Value: 11/10/2013 22:39     Performed at [facility]   Culture     Final   Value: GRAM POSITIVE COCCI IN CHAINS     Note: Gram Stain Report Called to,Read Back By and Verified With: JILL MOORE 11/11/13 @ 4:03PM BY RUSCOE A.     Performed at [facility]   Report Status PENDING   Incomplete  AFB CULTURE, BLOOD     Status: None   Collection Time    11/11/13  9:35 AM      Result Value Ref Range Status   Specimen Description BLOOD LEFT HAND   Final   Special Requests 5CC   Final   Culture     Final   Value: CULTURE WILL BE EXAMINED FOR 6 WEEKS BEFORE ISSUING A FINAL REPORT     Performed at [facility]   Report Status PENDING   Incomplete  CLOSTRIDIUM DIFFICILE BY PCR     Status: None   Collection Time    11/11/13  4:18 PM      Result Value Ref Range Status   C difficile by pcr NEGATIVE  NEGATIVE Final  CULTURE, BLOOD (ROUTINE X 2)     Status: None   Collection Time    11/11/13  8:25 PM      Result Value Ref Range Status   Specimen Description BLOOD RIGHT ARM   Final   Special Requests BOTTLES DRAWN AEROBIC AND ANAEROBIC [facility] EACH   Final   Culture  Setup Time     Final   Value: 11/12/2013 01:05     Performed at [facility]   Culture     Final   Value: GRAM POSITIVE COCCI IN CHAINS     Note: Gram Stain Report Called to,Read Back By and Verified With: FRANCIS PLEASANT 11/12/13 @  8:25PM BY RUSCOE A.     Performed at [facility]   Report Status PENDING   Incomplete    Studies/Results: Dg Abd 1 View  11/12/2013   CLINICAL DATA:  Colitis.  EXAM: ABDOMEN - 1 VIEW  COMPARISON:  CT of the abdomen and pelvis 11/10/2013.  FINDINGS: Oral contrast material is noted throughout the colon and distal rectum. No pathologic distention of small bowel. No pneumoperitoneum. Multiple surgical clips project over the right upper quadrant of the abdomen. Splenic contour appears enlarged.  IMPRESSION: 1. Nonobstructive bowel gas pattern. 2. No pneumoperitoneum. 3. Splenomegaly.   Electronically Signed   By: Vinnie Langton M.D.   On: 11/12/2013 11:47      Assessment/Plan:  Principal Problem:   Nausea and vomiting  in adult Active Problems:   AIDS   Large cell lymphoma   Cirrhosis, non-alcoholic   Pancytopenia   Enteritis   Other pancytopenia   Acute kidney injury   Hypotension, unspecified   Sepsis   Hypoglycemia   Other and unspecified noninfectious gastroenteritis and colitis(558.9)   Gram-positive cocci bacteremia   NSVT (nonsustained ventricular tachycardia)    Samuel Schultz is a 38 y.o. male with  HIV/AIDS, hx of large cell lymphoma, currently with VL down but cd4 still low problems with severe diarrhea extensive workup at [facility] including colonoscopy  admitted to [facility] with development of extensive colonic wall thickening involving the cecum and ascending colon," concerning for colitis" with negative C diff PCR again. Now found to be bacteremia with GP C in pairs  #1 GP C in pairs: given extensive colonic involvement would suspect organisms such as Streptococcus Bovis or similar organsm. I am concerned that pt very likely also has endocarditis  --continue rocephin 2 grams daily --obtaine TEE --repeat blood cultures  #2 Diffuse colitis: worse since admission by imaging  --I will continue rocephin and flagyl   I still think we likely need another  endoscopic evaluation with potential repeat biopsy for pathology, tissue for CMV by PCR.    LOS: 6 days   Truman Hayward 11/12/2013, 8:47 PM

## 2013-11-13 NOTE — Progress Notes (Addendum)
[place] for [facility]  Ceftriaxone day # 3 Flagyl day # 8  Subjective: underwent TEE no vegetation seen. Remains afebrile   Antibiotics:  Anti-infectives   Start     Dose/Rate Route Frequency Ordered Stop   11/11/13 1830  [MAR Hold]  cefTRIAXone (ROCEPHIN) 2 g in dextrose 5 % 50 mL IVPB     (On MAR Hold since 11/13/13 1147)   2 g 100 mL/hr over 30 Minutes Intravenous Every 24 hours 11/11/13 1744     11/11/13 1745  [MAR Hold]  metroNIDAZOLE (FLAGYL) tablet 500 mg     (On MAR Hold since 11/13/13 1147)   500 mg Oral 3 times per day 11/11/13 1744     11/11/13 1600  [MAR Hold]  vancomycin (VANCOCIN) IVPB 1000 mg/200 mL premix     (On MAR Hold since 11/13/13 1147)   1,000 mg 200 mL/hr over 60 Minutes Intravenous Every 12 hours 11/11/13 1555     11/11/13 1000  [MAR Hold]  fluconazole (DIFLUCAN) IVPB 100 mg     (On MAR Hold since 11/13/13 1147)   100 mg 50 mL/hr over 60 Minutes Intravenous Every 24 hours 11/11/13 0907     11/11/13 0815  metroNIDAZOLE (FLAGYL) IVPB 500 mg  Status:  Discontinued     500 mg 100 mL/hr over 60 Minutes Intravenous Every 8 hours 11/11/13 0801 11/11/13 1144   11/10/13 2145  piperacillin-tazobactam (ZOSYN) IVPB 3.375 g  Status:  Discontinued     3.375 g 12.5 mL/hr over 240 Minutes Intravenous Every 8 hours 11/10/13 2133 11/11/13 1744   11/10/13 1000  [MAR Hold]  azithromycin (ZITHROMAX) tablet 1,200 mg     (On MAR Hold since 11/13/13 1147)   1,200 mg Oral Weekly 11/07/13 1141     11/10/13 0800  [MAR Hold]  Darunavir Ethanolate (PREZISTA) tablet 800 mg     (On MAR Hold since 11/13/13 1147)   800 mg Oral Daily with breakfast 11/09/13 1320     11/07/13 1150  sulfamethoxazole-trimethoprim (BACTRIM DS) 800-160 MG per tablet 1 tablet  Status:  Discontinued     1 tablet Oral Daily 11/07/13 1150 11/08/13 1729   11/07/13 0800  [MAR Hold]  ritonavir (NORVIR) capsule 100 mg     (On MAR Hold since 11/13/13 1147)   100 mg Oral Daily with breakfast 11/06/13  1701     11/06/13 2200  [MAR Hold]  emtricitabine-tenofovir (TRUVADA) 200-300 MG per tablet 1 tablet     (On MAR Hold since 11/13/13 1147)   1 tablet Oral Daily at bedtime 11/06/13 1701     11/06/13 1715  [MAR Hold]  atovaquone (MEPRON) 750 MG/5ML suspension 1,500 mg     (On MAR Hold since 11/13/13 1147)   1,500 mg Oral Daily 11/06/13 1701     11/06/13 1715  Darunavir Ethanolate (PREZISTA) tablet 800 mg  Status:  Discontinued     800 mg Oral Daily 11/06/13 1701 11/09/13 1320   11/06/13 1715  metroNIDAZOLE (FLAGYL) IVPB 500 mg  Status:  Discontinued     500 mg 100 mL/hr over 60 Minutes Intravenous Every 8 hours 11/06/13 1710 11/10/13 1511      Medications: Scheduled Meds: . [MAR HOLD] atovaquone  1,500 mg Oral Daily  . [facility] HOLD] azithromycin  1,200 mg Oral Weekly  . [MAR HOLD] cefTRIAXone (ROCEPHIN)  IV  2 g Intravenous Q24H  . [MAR HOLD] Darunavir Ethanolate  800 mg Oral Q breakfast  . [MAR HOLD] diphenoxylate-atropine  2 tablet  Oral TID  . [facility] HOLD] emtricitabine-tenofovir  1 tablet Oral QHS  . [MAR HOLD] fluconazole (DIFLUCAN) IV  100 mg Intravenous Q24H  . [facility] HOLD] metroNIDAZOLE  500 mg Oral 3 times per day  . [facility] HOLD] morphine  15 mg Oral BID  . [MAR HOLD] pantoprazole  40 mg Oral BID  . [facility] HOLD] ritonavir  100 mg Oral Q breakfast  . [MAR HOLD] sucralfate  1 g Oral TID WC  . [facility] HOLD] vancomycin  1,000 mg Intravenous Q12H     Objective: Weight change:   Intake/Output Summary (Last 24 hours) at 11/13/13 1149 Last data filed at 11/13/13 0500  Gross per 24 hour  Intake   4000 ml  Output    900 ml  Net   3100 ml   Blood pressure 108/51, pulse 91, temperature 98.2 F (36.8 C), temperature source Oral, resp. rate 16, height 5\' 9"  (1.753 m), weight 203 lb 9.6 oz (92.352 kg), SpO2 97.00%. Temp:  [98.1 F (36.7 C)-99.2 F (37.3 C)] 98.2 F (36.8 C) (06/08 1123) Pulse Rate:  [89-94] 91 (06/08 1050) Resp:  [16-18] 16 (06/08 1123) BP: (104-113)/(51-82) 108/51 mmHg  (06/08 1123) SpO2:  [93 %-97 %] 97 % (06/08 1050) General: Alert and awake, oriented x3,  HEENT: EOMI  CVS regular rate, normal r, no murmur rubs or gallops  Chest: clear to auscultation bilaterally, no wheezing, rales or rhonchi  Abdomen: soft tender diffusely distended  Neuro: nonfocal   CBC:  Recent Labs Lab 11/09/13 0540 11/10/13 1635 11/11/13 0510 11/11/13 0935 11/12/13 0415 11/13/13 0520  HGB 11.0* 9.8* 9.4*  --  10.4* 8.6*  HCT 30.9* 28.0* 26.7*  --  29.8* 24.4*  PLT 56* 45* 30*  --  48* 31*  INR  --   --   --  2.61* 2.23* 2.02*     BMET  Recent Labs  11/11/13 0510 11/12/13 0415  NA 132* 138  K 4.6 3.8  CL 104 107  CO2 18* 19  GLUCOSE 64* 81  BUN 34* 23  CREATININE 1.47* 0.90  CALCIUM 7.9* 8.5      Micro Results: Recent Results (from the past 240 hour(s))  STOOL CULTURE     Status: None   Collection Time    11/08/13  2:37 AM      Result Value Ref Range Status   Specimen Description STOOL   Final   Special Requests NONE   Final   Culture     Final   Value: NO SALMONELLA, SHIGELLA, CAMPYLOBACTER, YERSINIA, OR E.COLI 0157:H7 ISOLATED     Note: REDUCED NORMAL FLORA PRESENT     Performed at [facility]   Report Status 11/13/2013 FINAL   Final  OVA AND PARASITE EXAMINATION     Status: None   Collection Time    11/08/13  2:37 AM      Result Value Ref Range Status   Specimen Description STOOL   Final   Special Requests NONE   Final   Ova and parasites     Final   Value: NO OVA OR PARASITES SEEN     Performed at [facility]   Report Status 11/09/2013 FINAL   Final  CLOSTRIDIUM DIFFICILE BY PCR     Status: None   Collection Time    11/08/13  2:37 AM      Result Value Ref Range Status   C difficile by pcr NEGATIVE  NEGATIVE Final  CULTURE, BLOOD (ROUTINE X 2)  Status: None   Collection Time    11/10/13  4:35 PM      Result Value Ref Range Status   Specimen Description BLOOD LEFT HAND   Final   Special Requests BOTTLES  DRAWN AEROBIC ONLY 3CC   Final   Culture  Setup Time     Final   Value: 11/10/2013 22:39     Performed at [facility]   Culture     Final   Value: GRAM POSITIVE COCCI IN CHAINS     Note: Gram Stain Report Called to,Read Back By and Verified With: JILL MOORE 11/11/13 @ 2:40PM BY RUSCOE A.     Performed at [facility]   Report Status PENDING   Incomplete  CULTURE, BLOOD (ROUTINE X 2)     Status: None   Collection Time    11/10/13  4:50 PM      Result Value Ref Range Status   Specimen Description BLOOD LEFT ANTECUBITAL   Final   Special Requests BOTTLES DRAWN AEROBIC ONLY 10CC   Final   Culture  Setup Time     Final   Value: 11/10/2013 22:39     Performed at [facility]   Culture     Final   Value: ENTEROCOCCUS SPECIES     Note: Gram Stain Report Called to,Read Back By and Verified With: JILL MOORE 11/11/13 @ 4:03PM BY RUSCOE A.     Performed at [facility]   Report Status PENDING   Incomplete  AFB CULTURE, BLOOD     Status: None   Collection Time    11/11/13  9:35 AM      Result Value Ref Range Status   Specimen Description BLOOD LEFT HAND   Final   Special Requests 5CC   Final   Culture     Final   Value: CULTURE WILL BE EXAMINED FOR 6 WEEKS BEFORE ISSUING A FINAL REPORT     Performed at [facility]   Report Status PENDING   Incomplete  CLOSTRIDIUM DIFFICILE BY PCR     Status: None   Collection Time    11/11/13  4:18 PM      Result Value Ref Range Status   C difficile by pcr NEGATIVE  NEGATIVE Final  CULTURE, BLOOD (ROUTINE X 2)     Status: None   Collection Time    11/11/13  8:25 PM      Result Value Ref Range Status   Specimen Description BLOOD RIGHT ARM   Final   Special Requests BOTTLES DRAWN AEROBIC AND ANAEROBIC [facility] EACH   Final   Culture  Setup Time     Final   Value: 11/12/2013 01:05     Performed at [facility]   Culture     Final   Value: GRAM POSITIVE COCCI IN CHAINS     Note: Gram Stain Report Called to,Read  Back By and Verified With: FRANCIS PLEASANT 11/12/13 @ 8:25PM BY RUSCOE A.     Performed at [facility]   Report Status PENDING   Incomplete  CULTURE, BLOOD (ROUTINE X 2)     Status: None   Collection Time    11/11/13  8:35 PM      Result Value Ref Range Status   Specimen Description BLOOD LEFT HAND   Final   Special Requests BOTTLES DRAWN AEROBIC ONLY 1CC   Final   Culture  Setup Time     Final   Value: 11/12/2013  01:05     Performed at [facility]     Final   Value:        BLOOD CULTURE RECEIVED NO GROWTH TO DATE CULTURE WILL BE HELD FOR 5 DAYS BEFORE ISSUING A FINAL NEGATIVE REPORT     Performed at [facility]   Report Status PENDING   Incomplete    Studies/Results: Dg Abd 1 View  11/12/2013   CLINICAL DATA:  Colitis.  EXAM: ABDOMEN - 1 VIEW  COMPARISON:  CT of the abdomen and pelvis 11/10/2013.  FINDINGS: Oral contrast material is noted throughout the colon and distal rectum. No pathologic distention of small bowel. No pneumoperitoneum. Multiple surgical clips project over the right upper quadrant of the abdomen. Splenic contour appears enlarged.  IMPRESSION: 1. Nonobstructive bowel gas pattern. 2. No pneumoperitoneum. 3. Splenomegaly.   Electronically Signed   By: Vinnie Langton M.D.   On: 11/12/2013 11:47      Assessment/Plan:  Principal Problem:   Nausea and vomiting in adult Active Problems:   AIDS   Large cell lymphoma   Cirrhosis, non-alcoholic   Pancytopenia   Enteritis   Other pancytopenia   Acute kidney injury   Hypotension, unspecified   Sepsis   Hypoglycemia   Other and unspecified noninfectious gastroenteritis and colitis(558.9)   Gram-positive cocci bacteremia   NSVT (nonsustained ventricular tachycardia)    Samuel Schultz is a 38 y.o. male with  HIV/AIDS, hx of large cell lymphoma, currently with VL down but cd4 still low problems with severe diarrhea extensive workup at [facility] including colonoscopy  admitted to  [facility] with development of extensive colonic wall thickening involving the cecum and ascending colon," concerning for colitis" with negative C diff PCR again. Found to have enterococcal bacteremia, TEE negative, but portacath likely involved due to repeated cultures being positive  #1 enterococcal bacteremia: likely translocated from colonic involvement. Since  TEE negative, important to get hisport removed  -- will change from ceftriaxone to ampicillin if amp S enterococcus -- will need repeat blood cx to document clearance before new line can be placed  #2 Diffuse colitis: he recently underwent colonoscopy with biopsy on 5/22 that did not show abnormality, thus colonic inflammation is thought to be due to hiv, as a diagnosis of exclusion, diarrhea will continue to give lomotil to help with perfuse diarrhea  #3 HIV = continue on his current regimen plus oi proph      LOS: 7 days   Carlyle Basques 11/13/2013, 11:49 AM

## 2013-11-14 ENCOUNTER — Encounter (HOSPITAL_COMMUNITY): Payer: Self-pay | Admitting: Cardiology

## 2013-11-14 DIAGNOSIS — K7460 Unspecified cirrhosis of liver: Secondary | ICD-10-CM

## 2013-11-14 DIAGNOSIS — B952 Enterococcus as the cause of diseases classified elsewhere: Secondary | ICD-10-CM

## 2013-11-14 LAB — CBC
HEMATOCRIT: 28.8 % — AB (ref 39.0–52.0)
HEMOGLOBIN: 9.9 g/dL — AB (ref 13.0–17.0)
MCH: 35.4 pg — ABNORMAL HIGH (ref 26.0–34.0)
MCHC: 34.4 g/dL (ref 30.0–36.0)
MCV: 102.9 fL — ABNORMAL HIGH (ref 78.0–100.0)
Platelets: 40 10*3/uL — ABNORMAL LOW (ref 150–400)
RBC: 2.8 MIL/uL — ABNORMAL LOW (ref 4.22–5.81)
RDW: 17.7 % — ABNORMAL HIGH (ref 11.5–15.5)
WBC: 4.3 10*3/uL (ref 4.0–10.5)

## 2013-11-14 LAB — PREPARE FRESH FROZEN PLASMA
UNIT DIVISION: 0
Unit division: 0
Unit division: 0

## 2013-11-14 LAB — CULTURE, BLOOD (ROUTINE X 2)

## 2013-11-14 LAB — PROTIME-INR
INR: 2.03 — ABNORMAL HIGH (ref 0.00–1.49)
Prothrombin Time: 22.3 seconds — ABNORMAL HIGH (ref 11.6–15.2)

## 2013-11-14 MED ORDER — VANCOMYCIN HCL IN DEXTROSE 1-5 GM/200ML-% IV SOLN
1000.0000 mg | Freq: Three times a day (TID) | INTRAVENOUS | Status: DC
  Administered 2013-11-14 – 2013-11-15 (×2): 1000 mg via INTRAVENOUS
  Filled 2013-11-14 (×4): qty 200

## 2013-11-14 MED ORDER — VITAMIN K1 10 MG/ML IJ SOLN
10.0000 mg | Freq: Once | INTRAMUSCULAR | Status: AC
  Administered 2013-11-14: 10 mg via SUBCUTANEOUS
  Filled 2013-11-14 (×2): qty 1

## 2013-11-14 MED ORDER — ADULT MULTIVITAMIN W/MINERALS CH
1.0000 | ORAL_TABLET | Freq: Every day | ORAL | Status: DC
  Administered 2013-11-15 – 2013-11-25 (×10): 1 via ORAL
  Filled 2013-11-14 (×12): qty 1

## 2013-11-14 MED ORDER — FLUCONAZOLE 100MG IVPB
100.0000 mg | INTRAVENOUS | Status: DC
  Administered 2013-11-14: 100 mg via INTRAVENOUS
  Filled 2013-11-14 (×2): qty 50

## 2013-11-14 NOTE — Progress Notes (Signed)
Subjective: Complaining of abdominal pain.  Objective: Vital signs in last 24 hours: Temp:  [98.4 F (36.9 C)-99.6 F (37.6 C)] 98.7 F (37.1 C) (06/09 0748) Pulse Rate:  [80-92] 90 (06/09 0748) Resp:  [18-20] 18 (06/09 0748) BP: (95-120)/(48-62) 118/60 mmHg (06/09 0748) SpO2:  [95 %-100 %] 98 % (06/09 0748) Last BM Date: 11/14/13  Intake/Output from previous day: 06/08 0701 - 06/09 0700 In: 2934 [P.O.:480; I.V.:1848; Blood:606] Out: 450 [Urine:450] Intake/Output this shift: Total I/O In: 320 [P.O.:290; I.V.:30] Out: 400 [Urine:400]  General appearance: sleepy and difficult to arouse GI: tender in the mid abdomen  Lab Results:  Recent Labs  11/12/13 0415 11/13/13 0520 11/14/13 0350  WBC 4.4 2.8* 4.3  HGB 10.4* 8.6* 9.9*  HCT 29.8* 24.4* 28.8*  PLT 48* 31* 40*   BMET  Recent Labs  11/12/13 0415  NA 138  K 3.8  CL 107  CO2 19  GLUCOSE 81  BUN 23  CREATININE 0.90  CALCIUM 8.5   LFT No results found for this basename: PROT, ALBUMIN, AST, ALT, ALKPHOS, BILITOT, BILIDIR, IBILI,  in the last 72 hours PT/INR  Recent Labs  11/13/13 0520 11/14/13 0900  LABPROT 22.2* 22.3*  INR 2.02* 2.03*   Hepatitis Panel No results found for this basename: HEPBSAG, HCVAB, HEPAIGM, HEPBIGM,  in the last 72 hours C-Diff No results found for this basename: CDIFFTOX,  in the last 72 hours Fecal Lactopherrin No results found for this basename: FECLLACTOFRN,  in the last 72 hours  Studies/Results: No results found.  Medications:  Scheduled: . ampicillin (OMNIPEN) IV  2 g Intravenous 6 times per day  . atovaquone  1,500 mg Oral Daily  . azithromycin  1,200 mg Oral Weekly  . Darunavir Ethanolate  800 mg Oral Q breakfast  . diphenoxylate-atropine  2 tablet Oral TID  . emtricitabine-tenofovir  1 tablet Oral QHS  . fluconazole (DIFLUCAN) IV  100 mg Intravenous Q24H  . furosemide  20 mg Oral Daily  . metroNIDAZOLE  500 mg Oral 3 times per day  . morphine  15 mg Oral  BID  . multivitamin with minerals  1 tablet Oral Daily  . nystatin  5 mL Oral QID  . pantoprazole  40 mg Oral BID  . ritonavir  100 mg Oral Q breakfast  . spironolactone  50 mg Oral Daily  . sucralfate  1 g Oral TID WC  . vancomycin  1,000 mg Intravenous Q8H   Continuous: . dextrose 5 % and 0.9% NaCl 100 mL/hr at 11/14/13 0758    Assessment/Plan: 1) Colitis. 2) HIV. 3) Cirrhosis. 4) Bacteremia.    No change with his pain.  His CMV DNA is pending at this time.  Very difficult case.    Plan: 1) Continue with antibiotics. 2) Await CMV DNA. 3) Pain control.  LOS: 8 days   Beryle Beams 11/14/2013, 6:12 PM

## 2013-11-14 NOTE — Progress Notes (Signed)
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 IR is not comfortable with removal with INR high and thrombocytopenia   Objective: Vital signs in last 24 hours: Temp:  [98.3 F (36.8 C)-99.6 F (37.6 C)] 98.7 F (37.1 C) (06/09 0748) Pulse Rate:  [80-92] 90 (06/09 0748) Resp:  [18-20] 18 (06/09 0748) BP: (95-121)/(48-62) 118/60 mmHg (06/09 0748) SpO2:  [95 %-100 %] 98 % (06/09 0748) Last BM Date: 11/14/13  Intake/Output from previous day: 06/08 0701 - 06/09 0700 In: 2934 [P.O.:480; I.V.:1848; Blood:606] Out: 450 [Urine:450] Intake/Output this shift: Total I/O In: 80 [P.O.:50; I.V.:30] Out: 400 [Urine:400]  PE:  Afeb; vss BC + 6/5 and 6/6 plts 40 INR 2.03 PAC is in use Site is NT; no redness; no sign of infection   Lab Results:   Recent Labs  11/13/13 0520 11/14/13 0350  WBC 2.8* 4.3  HGB 8.6* 9.9*  HCT 24.4* 28.8*  PLT 31* 40*   BMET  Recent Labs  11/12/13 0415  NA 138  K 3.8  CL 107  CO2 19  GLUCOSE 81  BUN 23  CREATININE 0.90  CALCIUM 8.5   PT/INR  Recent Labs  11/13/13 0520 11/14/13 0900  LABPROT 22.2* 22.3*  INR 2.02* 2.03*   ABG No results found for this basename: PHART, PCO2, PO2, HCO3,  in the last 72 hours  Studies/Results: No results found.  Anti-infectives: Anti-infectives   Start     Dose/Rate Route Frequency Ordered Stop   11/14/13 2000  vancomycin (VANCOCIN) IVPB 1000 mg/200 mL premix     1,000 mg 200 mL/hr over 60 Minutes Intravenous Every 8 hours 11/14/13 1105     11/14/13 1200  fluconazole  (DIFLUCAN) IVPB 100 mg     100 mg 50 mL/hr over 60 Minutes Intravenous Every 24 hours 11/14/13 1101     11/13/13 1615  ampicillin (OMNIPEN) 2 g in sodium chloride 0.9 % 50 mL IVPB     2 g 150 mL/hr over 20 Minutes Intravenous 6 times per day 11/13/13 1611     11/11/13 1830  cefTRIAXone (ROCEPHIN) 2 g in dextrose 5 % 50 mL IVPB  Status:  Discontinued     2 g 100 mL/hr over 30 Minutes Intravenous Every 24 hours 11/11/13 1744 11/13/13 1611   11/11/13 1745  metroNIDAZOLE (FLAGYL) tablet 500 mg     500 mg Oral 3 times per day 11/11/13 1744     11/11/13 1600  vancomycin (VANCOCIN) IVPB 1000 mg/200 mL premix  Status:  Discontinued     1,000 mg 200 mL/hr over 60 Minutes Intravenous Every 12 hours 11/11/13 1555 11/14/13 1105   11/11/13 1000  fluconazole (DIFLUCAN) IVPB 100 mg  Status:  Discontinued     100 mg 50 mL/hr over 60 Minutes Intravenous Every 24 hours 11/11/13 0907 11/14/13 1101   11/11/13 0815  metroNIDAZOLE (FLAGYL) IVPB 500 mg  Status:  Discontinued     500 mg 100 mL/hr over 60 Minutes Intravenous Every 8 hours 11/11/13 0801 11/11/13 1144   11/10/13 2145  piperacillin-tazobactam (ZOSYN)  IVPB 3.375 g  Status:  Discontinued     3.375 g 12.5 mL/hr over 240 Minutes Intravenous Every 8 hours 11/10/13 2133 11/11/13 1744   11/10/13 1000  azithromycin (ZITHROMAX) tablet 1,200 mg     1,200 mg Oral Weekly 11/07/13 1141     11/10/13 0800  Darunavir Ethanolate (PREZISTA) tablet 800 mg     800 mg Oral Daily with breakfast 11/09/13 1320     11/07/13 1150  sulfamethoxazole-trimethoprim (BACTRIM DS) 800-160 MG per tablet 1 tablet  Status:  Discontinued     1 tablet Oral Daily 11/07/13 1150 11/08/13 1729   11/07/13 0800  ritonavir (NORVIR) capsule 100 mg     100 mg Oral Daily with breakfast 11/06/13 1701     11/06/13 2200  emtricitabine-tenofovir (TRUVADA) 200-300 MG per tablet 1 tablet     1 tablet Oral Daily at bedtime 11/06/13 1701     11/06/13 1715  atovaquone (MEPRON) 750 MG/5ML suspension  1,500 mg     1,500 mg Oral Daily 11/06/13 1701     11/06/13 1715  Darunavir Ethanolate (PREZISTA) tablet 800 mg  Status:  Discontinued     800 mg Oral Daily 11/06/13 1701 11/09/13 1320   11/06/13 1715  metroNIDAZOLE (FLAGYL) IVPB 500 mg  Status:  Discontinued     500 mg 100 mL/hr over 60 Minutes Intravenous Every 8 hours 11/06/13 1710 11/10/13 1511      Assessment/Plan:  HIV/AIDS Hx Lymphoma +BC; enterococcus N/V ab pain: colitis PAC removal in IR when pt more appropriate in way of labs INR to be under 1.7 and plts over 50K We can hang plts for procedure if needed  Consider surgical consult Or Consider continued use of PAC through treatment of sepsis Maybe coagulopathy and thrombocytopenia will improve IR PA will keep pt on radar Will monitor labs and keep up with [facility] MD Pt aware of plan and agreeable  TRH MD and IR Dr Samuel Schultz have discussed this case    LOS: 8 days    Samuel Schultz 11/14/2013

## 2013-11-14 NOTE — Progress Notes (Signed)
INITIAL NUTRITION ASSESSMENT  DOCUMENTATION CODES Per approved criteria  -Severe malnutrition in the context of chronic illness -Obesity Unspecified   Pt meets criteria for severe MALNUTRITION in the context of chronic illness as evidenced by wt loss of 11.7 % x 2 months and energy intake </=75% of minimum estimated needs >/= 1 month.   INTERVENTION:  Diet advancement per MD  Pt will benefit from oral nutrition supplements when diet advanced   Daily multivitamin   Re-weigh patient (RN aware)  RD to continue to follow   NUTRITION DIAGNOSIS: Increased nutrient needs related to HIV/AIDS as evidenced by estimated needs   Goal: Pt to meet >/=90% of estimated nutrition needs   Monitor:  Diet advancement, weight trend, labs   Reason for Assessment: Positive Malnutrition Screening Tool Score   38 y.o. male  Admitting Dx: Nausea and vomiting in adult  ASSESSMENT: Patient is a 38 y.o. 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 difficile 2 years ago, history of GERD and recent hospitalization for colitis present to emergency department with a chief complaint of intractable nausea vomiting and abdominal pain. 2 days PTA he developed nausea with persistent vomiting. He reported having 10-12 loose stools daily.  Pt recently hospitalized May 2015 for persistent fevers, nausea vomiting and diarrhea. Diagnosed with colitis of unknown etiology.   - Per chart review, pt reported being hospitalized at [facility] with symptoms of nausea/vomiting, persistent cough in April 2015  - Pt states that he has been losing weight since April, "When I got sick" - Per Epic chart, pt does not have extensive weight history. Pt has lost 4.2% x 1 month per chart review.  - Pt states that he normally weighs 130 lbs and last weighed this in April 2015. Per pt, pt has weight loss of 11.7 % x 2 months,  which is severe for time frame.  - Pt does not have recent weight since admission--> recommend re-weigh patient (RN notified) - Pt has been on clear liquid, full liquid, NPO for the past 8 days  - Pt continues to be in much pain   Per MD note, C-diff test negative. Diarrhea has somewhat improved. No N/V but continues to have abdominal pain.   No muscle or subcutaneous fat depletion noticed.    Height: Ht Readings from Last 1 Encounters:  11/06/13 5\' 9"  (1.753 m)    Weight: Wt Readings from Last 1 Encounters:  11/06/13 203 lb 9.6 oz (92.352 kg)    Ideal Body Weight: 72.7 kg   % Ideal Body Weight: 127%   Wt Readings from Last 10 Encounters:  11/06/13 203 lb 9.6 oz (92.352 kg)  11/06/13 203 lb 9.6 oz (92.352 kg)  10/16/13 212 lb 1.6 oz (96.208 kg)   Wt Readings from Last 50 Encounters:  11/06/13 203 lb 9.6 oz (92.352 kg)  11/06/13 203 lb 9.6 oz (92.352 kg)  10/16/13 212 lb 1.6 oz (96.208 kg)     Usual Body Weight: 230 lbs, per pt   % Usual Body Weight: 88%   BMI:  Body mass index is 30.05 kg/(m^2)., Obesity Class I   Estimated Nutritional Needs: Kcal: 2400 - 2600  Protein: 115 - 125 grams  Fluid: >/= 2.7 L/ day   Skin: +2 bilateral LE edema   Diet Order: NPO  EDUCATION NEEDS: -No education needs identified at this time   Intake/Output Summary (Last 24 hours) at 11/14/13  1102 Last data filed at 11/14/13 0748  Gross per 24 hour  Intake   2964 ml  Output    450 ml  Net   2514 ml    Last BM: 6/8, per GI note    Labs:   Recent Labs Lab 11/10/13 0505 11/11/13 0510 11/12/13 0415  NA 132* 132* 138  K 4.3 4.6 3.8  CL 105 104 107  CO2 19 18* 19  BUN 25* 34* 23  CREATININE 1.22 1.47* 0.90  CALCIUM 7.9* 7.9* 8.5  MG  --   --  1.6  GLUCOSE 84 64* 81    CBG (last 3)  No results found for this basename: GLUCAP,  in the last 72 hours  Scheduled Meds: . ampicillin (OMNIPEN) IV  2 g Intravenous 6 times per day  . atovaquone  1,500 mg Oral Daily  .  azithromycin  1,200 mg Oral Weekly  . Darunavir Ethanolate  800 mg Oral Q breakfast  . diphenoxylate-atropine  2 tablet Oral TID  . emtricitabine-tenofovir  1 tablet Oral QHS  . fluconazole (DIFLUCAN) IV  100 mg Intravenous Q24H  . furosemide  20 mg Oral Daily  . metroNIDAZOLE  500 mg Oral 3 times per day  . morphine  15 mg Oral BID  . nystatin  5 mL Oral QID  . pantoprazole  40 mg Oral BID  . phytonadione  10 mg Subcutaneous Once  . ritonavir  100 mg Oral Q breakfast  . spironolactone  50 mg Oral Daily  . sucralfate  1 g Oral TID WC  . vancomycin  1,000 mg Intravenous Q12H    Continuous Infusions: . dextrose 5 % and 0.9% NaCl 100 mL/hr at 11/14/13 2778    Past Medical History  Diagnosis Date  . Cancer   . Lymphoma   . HIV disease   . Cirrhosis   . PTSD (post-traumatic stress disorder)   . Depressive disorder     Past Surgical History  Procedure Laterality Date  . Cholecystectomy    . Appendectomy    . Tee without cardioversion N/A 11/13/2013    Procedure: TRANSESOPHAGEAL ECHOCARDIOGRAM (TEE);  Surgeon: Dorothy Spark, MD;  Location: [place];  Service: Cardiovascular;  Laterality: N/A;    Carrolyn Leigh, BS Dietetic Intern Pager: [phone]

## 2013-11-14 NOTE — Progress Notes (Signed)
[facility] PROGRESS NOTE  Samuel Schultz ZTI:[id number] DOB: [birth year] DOA: 11/06/2013 PCP: PROVIDER NOT IN SYSTEM   Brief narrative 38 year old male with past medical history of HIV/AIDS on HAART (last CD4 10/18/2013 20), large cell lymphoma with liver infiltration (treawted with R-CHOP in 2010-2012 (followed at [facility]), recent admission for colitis (transfer[facility] on 10/[facility]) where he had full w/up including negative stool studies, cx , negative colonoscopy and a bone marrow bx showing hypercellular marrow without finidings of lymphoma or leukemia. He showed improvement in symptoms there an was discharged off abx on 5/22. He then  presented to [facility] ED 11/06/2013 with ongoing fatigue, weakness, diarrhea. He was transferred to [facility] for further care per GI (Dr. Dudley Major in [facility]) recommendations as he thought that the pt requires higher level of care.  Pt was started on flagyl for treatment of possible enteritis. CT abd done showed wall thickening throughout colon. Hospital course prolonged due to persistent worsening colitis and bacteremia.  Assessment/Plan: Persistent colitis -possibly related to AIDS given recurrent symptoms for over 3 weeks - repeat stool for C.diff negative, stool culture, ova and parasites negative. Diarrhea has somewhat improved on Lomotil but still has abdominal pain.-Flagyl discontinued on 6/5 but was restarted even worsened colitis seen on repeat CT scan..  -Patient continues to have diarrhea although frequency had reduced in last 3 days. No nausea or vomiting but has persistent abdominal pain. Lactic acid normalized. Zosyn switched to Rocephin given bacteremia. -IV hydration and clear liquid diet. GI consulted. No further workup recommended recent negative workups including studies and CMV culture from colon biopsy.  Active Problems:  GPC bacteremia On 6/5 patient was hypotensive and hypoglycemic. Repeat CT scan of the abdomen showed worsening colitis. Blood culture 2/2 on  6/5 growing enterococcus. Placed on IV Rocephin. Continue vancomycin and Flagyl. Patient has a right-sided Port-A-Cath for chemotherapy placed few years back . - TEE on 6/8 which was negative for vegetation or  thrombus. 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 -Will followup with blood culture sensitivity and determine course of abx. -Plan to remove the Port-A-Cath however has a low platelet and high INR which needs to be corrected. Ordered vitamin K and will order 3 more units of FFP in am ( has received 6 u FFP already.)  HIV/AIDS on HAART  - appreciate ID consult and recommendations  - pt is on ART . CD4 count of 20 - also on azithromycin and bactrim for PCP and MAC prophylaxis .monitor Qtc.  Continue mepron   NSVT on 6/7 low magnesium corrected. EKG within normal limits. No further episodes.  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 Bone marrow bx done at [facility] recently negative for lymphoma  Pancytopenia  - likely bone marrow suppression from HIV/AIDS and liver cirrhosis . No lymphoma seen on recent bone marrow bx done at [facility].   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 Has thrombocytopenia and coagulopathy.    Depression  Continue Lexapro.  Liver cirrhosis  Resumed Aldactone and Lasix.  AKI Secondary to dehydration. Resolved.  Oral thrush On IV fluconazole. Added nystatin swish and swallow   DVT prophylaxis: SCD   Code Status: full code Family Communication: none at bedside Disposition Plan: PT eval. May need SNF   Consultants:  ID  DIET: full liquids  Procedures:  none  Antibiotics:  IV flagyl (6/1>>  IV zosyn 6/5>>6/7  Iv Ampicillin (6/8--  IV vancomycin 6/7--  HPI/Subjective: Pt reports persistent abdominal pain. Diarrhea seems to be improving.  Objective: Filed Vitals:   11/14/13 0748  BP: 118/60  Pulse: 90  Temp:  98.7 F (37.1 C)  Resp: 18    Intake/Output Summary (Last 24 hours) at 11/14/13 1409 Last data filed at 11/14/13 1050  Gross per 24 hour  Intake    874 ml  Output    850 ml  Net     24 ml   Filed Weights   11/06/13 0935 11/06/13 1722 11/06/13 2100  Weight: 99.791 kg (220 lb) 99.791 kg (220 lb) 92.352 kg (203 lb 9.6 oz)    Exam:   General:  Middle aged male appears fatigued  HEENT: no pallor , dry oral mucosa   chest: clear b/l, no added sounds, rt sided port A cath  CVS: S1 and S2 normal, no murmurs rub or gallop  Abdomen: Soft,diffuse tenderness,  BS+  Musculoskeletal: warm, 1+ edema  CNS: AAOX3  Data Reviewed: Basic Metabolic Panel:  Recent Labs Lab 11/08/13 0940 11/09/13 1125 11/10/13 0505 11/11/13 0510 11/12/13 0415  NA 134* 132* 132* 132* 138  K 3.9 4.3 4.3 4.6 3.8  CL 107 104 105 104 107  CO2 14* 17* 19 18* 19  GLUCOSE 160* 99 84 64* 81  BUN 11 26* 25* 34* 23  CREATININE 1.81* 1.86* 1.22 1.47* 0.90  CALCIUM 8.2* 7.8* 7.9* 7.9* 8.5  MG  --   --   --   --  1.6   Liver Function Tests:  Recent Labs Lab 11/08/13 0940  AST 52*  ALT 18  ALKPHOS 81  BILITOT 1.8*  PROT 5.9*  ALBUMIN 2.1*   No results found for this basename: LIPASE, AMYLASE,  in the last 168 hours No results found for this basename: AMMONIA,  in the last 168 hours CBC:  Recent Labs Lab 11/10/13 1635 11/11/13 0510 11/12/13 0415 11/13/13 0520 11/14/13 0350  WBC 2.6* 2.8* 4.4 2.8* 4.3  HGB 9.8* 9.4* 10.4* 8.6* 9.9*  HCT 28.0* 26.7* 29.8* 24.4* 28.8*  MCV 103.3* 102.7* 101.7* 101.2* 102.9*  PLT 45* 30* 48* 31* 40*   Cardiac Enzymes: No results found for this basename: CKTOTAL, CKMB, CKMBINDEX, TROPONINI,  in the last 168 hours BNP (last 3 results) No results found for this basename: PROBNP,  in the last 8760 hours CBG:  Recent Labs Lab 11/10/13 0853 11/10/13 0917 11/10/13 0928 11/10/13 1505  GLUCAP 61* 71 73 91    Recent Results (from the past 240 hour(s))   STOOL CULTURE     Status: None   Collection Time    11/08/13  2:37 AM      Result Value Ref Range Status   Specimen Description STOOL   Final   Special Requests NONE   Final   Culture     Final   Value: NO SALMONELLA, SHIGELLA, CAMPYLOBACTER, YERSINIA, OR E.COLI 0157:H7 ISOLATED     Note: REDUCED NORMAL FLORA PRESENT     Performed at [facility]   Report Status 11/13/2013 FINAL   Final  OVA AND PARASITE EXAMINATION     Status: None   Collection Time    11/08/13  2:37 AM      Result Value Ref Range Status   Specimen Description STOOL   Final   Special Requests NONE   Final   Ova and parasites     Final   Value: NO OVA OR PARASITES SEEN     Performed  at [facility]   Report Status 11/09/2013 FINAL   Final  CLOSTRIDIUM DIFFICILE BY PCR     Status: None   Collection Time    11/08/13  2:37 AM      Result Value Ref Range Status   C difficile by pcr NEGATIVE  NEGATIVE Final  CULTURE, BLOOD (ROUTINE X 2)     Status: None   Collection Time    11/10/13  4:35 PM      Result Value Ref Range Status   Specimen Description BLOOD LEFT HAND   Final   Special Requests BOTTLES DRAWN AEROBIC ONLY 3CC   Final   Culture  Setup Time     Final   Value: 11/10/2013 22:39     Performed at [facility]   Culture     Final   Value: GRAM POSITIVE COCCI IN CHAINS     Note: Gram Stain Report Called to,Read Back By and Verified With: JILL MOORE 11/11/13 @ 2:40PM BY RUSCOE A.     Performed at [facility]   Report Status PENDING   Incomplete  CULTURE, BLOOD (ROUTINE X 2)     Status: None   Collection Time    11/10/13  4:50 PM      Result Value Ref Range Status   Specimen Description BLOOD LEFT ANTECUBITAL   Final   Special Requests BOTTLES DRAWN AEROBIC ONLY 10CC   Final   Culture  Setup Time     Final   Value: 11/10/2013 22:39     Performed at [facility]   Culture     Final   Value: ENTEROCOCCUS SPECIES     Note: Gram Stain Report Called to,Read Back By  and Verified With: JILL MOORE 11/11/13 @ 4:03PM BY RUSCOE A.     Performed at [facility]   Report Status PENDING   Incomplete  AFB CULTURE, BLOOD     Status: None   Collection Time    11/11/13  9:35 AM      Result Value Ref Range Status   Specimen Description BLOOD LEFT HAND   Final   Special Requests 5CC   Final   Culture     Final   Value: CULTURE WILL BE EXAMINED FOR 6 WEEKS BEFORE ISSUING A FINAL REPORT     Performed at [facility]   Report Status PENDING   Incomplete  CLOSTRIDIUM DIFFICILE BY PCR     Status: None   Collection Time    11/11/13  4:18 PM      Result Value Ref Range Status   C difficile by pcr NEGATIVE  NEGATIVE Final  CULTURE, BLOOD (ROUTINE X 2)     Status: None   Collection Time    11/11/13  8:25 PM      Result Value Ref Range Status   Specimen Description BLOOD RIGHT ARM   Final   Special Requests BOTTLES DRAWN AEROBIC AND ANAEROBIC [facility] Iv EACH   Final   Culture  Setup Time     Final   Value: 11/12/2013 01:05     Performed at [facility]   Culture     Final   Value: GRAM POSITIVE COCCI IN CHAINS     Note: Gram Stain Report Called to,Read Back By and Verified With: FRANCIS PLEASANT 11/12/13 @ 8:25PM BY RUSCOE A.     Performed at [facility] PENDING   Incomplete  CULTURE, BLOOD (ROUTINE X 2)  Status: None   Collection Time    11/11/13  8:35 PM      Result Value Ref Range Status   Specimen Description BLOOD LEFT HAND   Final   Special Requests BOTTLES DRAWN AEROBIC ONLY 1CC   Final   Culture  Setup Time     Final   Value: 11/12/2013 01:05     Performed at [facility]   Culture     Final   Value:        BLOOD CULTURE RECEIVED NO GROWTH TO DATE CULTURE WILL BE HELD FOR 5 DAYS BEFORE ISSUING A FINAL NEGATIVE REPORT     Performed at [facility]   Report Status PENDING   Incomplete  URINE CULTURE     Status: None   Collection Time    11/12/13 11:22 AM      Result Value Ref Range Status    Specimen Description URINE, CLEAN CATCH   Final   Special Requests Immunocompromised   Final   Culture  Setup Time     Final   Value: 11/12/2013 23:37     Performed at [place]     Final   Value: NO GROWTH     Performed at [facility]   Culture     Final   Value: NO GROWTH     Performed at [facility]   Report Status 11/13/2013 FINAL   Final     Studies: No results found.  Scheduled Meds: . ampicillin (OMNIPEN) IV  2 g Intravenous 6 times per day  . atovaquone  1,500 mg Oral Daily  . azithromycin  1,200 mg Oral Weekly  . Darunavir Ethanolate  800 mg Oral Q breakfast  . diphenoxylate-atropine  2 tablet Oral TID  . emtricitabine-tenofovir  1 tablet Oral QHS  . fluconazole (DIFLUCAN) IV  100 mg Intravenous Q24H  . furosemide  20 mg Oral Daily  . metroNIDAZOLE  500 mg Oral 3 times per day  . morphine  15 mg Oral BID  . multivitamin with minerals  1 tablet Oral Daily  . nystatin  5 mL Oral QID  . pantoprazole  40 mg Oral BID  . ritonavir  100 mg Oral Q breakfast  . spironolactone  50 mg Oral Daily  . sucralfate  1 g Oral TID WC  . vancomycin  1,000 mg Intravenous Q8H   Continuous Infusions: . dextrose 5 % and 0.9% NaCl 100 mL/hr at 11/14/13 0758      Time spent: 35 minutes    Samuel Schultz  [facility] Pager [phone]. If 7PM-7AM, please contact night-coverage at [email or web address], password [facility] 11/14/2013, 2:09 PM  LOS: 8 days

## 2013-11-14 NOTE — Clinical Social Work Psychosocial (Signed)
Clinical Social Work Department BRIEF PSYCHOSOCIAL ASSESSMENT 11/14/2013  Patient:  Samuel Schultz, Samuel Schultz     Account Number:  [id number]     Admit date:  11/06/2013  Clinical Social Worker:  Daiva Huge  Date/Time:  11/14/2013 01:57 PM  Referred by:  Physician  Date Referred:  11/13/2013 Referred for  ALF Placement   Other Referral:   Interview type:  Patient Other interview type:    PSYCHOSOCIAL DATA Living Status:  FACILITY Admitted from facility:  Other Level of care:  Assisted Living Primary support name:  FRIENDS [place] Primary support relationship to patient:  FRIEND Degree of support available:   MINIMAL    CURRENT CONCERNS Current Concerns  Post-Acute Placement   Other Concerns:    SOCIAL WORK ASSESSMENT / PLAN Met with patient at bedside who was anxiously awaiting assistance to bathroom- while he waited for assistance we discussed possible dc plans- Per his report, he has been at [facility] for about 2 weeks- prior to that he was at [facility] and living in [place] with friends.  He reports his health has declined and he needed so much care he was placed in ALF. He understands he may need a higher level of care at dc (SNF for IV ABX and/or rehab).  He is agreeable to pursuing SNF and would like to get back towards the [facility] area- I will seek beds in that direction.   Assessment/plan status:  Other - See comment Other assessment/ plan:   FL2 and PASARR for SNF search   Information/referral to community resources:   Medicare, SNF, EMS    PATIENT'S/FAMILY'S RESPONSE TO PLAN OF CARE: Patient is distracted because he needs to go to the bathroom- he is open to the search for SNF in case needed and I will f/u again later today with him for further discussion.        Eduard Clos, MSW, [place]

## 2013-11-14 NOTE — Clinical Social Work Placement (Addendum)
Clinical Social Work Department CLINICAL SOCIAL WORK PLACEMENT NOTE 11/14/2013  Patient:  Samuel Schultz, Samuel Schultz  Account Number:  [id number] Admit date:  11/06/2013  Clinical Social Worker:  Daiva Huge  Date/time:  11/14/2013 02:09 PM  Clinical Social Work is seeking post-discharge placement for this patient at the following level of care:   [place]   (*CSW will update this form in Epic as items are completed)   11/14/2013  Patient/family provided with [facility] Department of Clinical Social Work's list of facilities offering this level of care within the geographic area requested by the patient (or if unable, by the patient's family).  11/14/2013  Patient/family informed of their freedom to choose among providers that offer the needed level of care, that participate in Medicare, Medicaid or managed care program needed by the patient, have an available bed and are willing to accept the patient.  11/14/2013  Patient/family informed of [facility]' ownership interest in [facility], as well as of the fact that they are under no obligation to receive care at this facility.  PASARR submitted to EDS on 11/14/2013 PASARR number received on [phone]   FL2 transmitted to all facilities in geographic area requested by pt/family on  11/14/2013 FL2 transmitted to all facilities within larger geographic area on   Patient informed that his/her managed care company has contracts with or will negotiate with  certain facilities, including the following:     Patient/family informed of bed offers received:   Patient chooses bed at  Physician recommends and patient chooses bed at    Patient to be transferred to  on   Patient to be transferred to facility by  Patient and family notified of transfer on  Name of family member notified:    The following physician request were entered in Epic:   Additional Comments:   Eduard Clos, MSW, [place]

## 2013-11-14 NOTE — Evaluation (Signed)
Physical Therapy Evaluation Patient Details Name: Samuel Schultz MRN: [id number] DOB: July 16, 1975 Today's Date: 11/14/2013   History of Present Illness  Admitted as transfer from [facility] with N/V/D due to persistent colitis over 3 week period.  ?complication of HIV.  Clinical Impression  Pt admitted with/for above problem.  Pt currently limited functionally due to the problems listed. ( See problems list.)   Pt will benefit from PT to maximize function and safety in order to get ready for next venue listed below.     Follow Up Recommendations Other (comment) (long term care-- assisted living vs SNF)    Equipment Recommendations  Rolling walker with 5" wheels    Recommendations for Other Services       Precautions / Restrictions Precautions Precautions: Fall      Mobility  Bed Mobility Overal bed mobility: Needs Assistance Bed Mobility: Supine to Sit;Sit to Supine     Supine to sit: Min assist Sit to supine: Mod assist   General bed mobility comments: assist due to pain in legs primarily  Transfers Overall transfer level: Needs assistance Equipment used: Rolling walker (2 wheeled) Transfers: Sit to/from Stand Sit to Stand: Mod assist         General transfer comment: lifting and coming forward assist  Ambulation/Gait Ambulation/Gait assistance: Min assist Ambulation Distance (Feet): 180 Feet Assistive device: Rolling walker (2 wheeled) Gait Pattern/deviations: Step-through pattern;Trunk flexed Gait velocity: very slow   General Gait Details: slow, painful gait with moderately heavy use of the RW.  Stairs            Wheelchair Mobility    Modified Rankin (Stroke Patients Only)       Balance Overall balance assessment: Needs assistance Sitting-balance support: No upper extremity supported Sitting balance-Leahy Scale: Fair     Standing balance support: Bilateral upper extremity supported Standing balance-Leahy Scale: Poor                                Pertinent Vitals/Pain C/o severe pain R LE    Home Living Family/patient expects to be discharged to:: Assisted living (needs longer term care)               Home Equipment: None      Prior Function Level of Independence: Independent (Has needed some level of assist for the past ?35months)               Hand Dominance        Extremity/Trunk Assessment               Lower Extremity Assessment: Generalized weakness (especially limited MMT R LE due to pain)         Communication   Communication: No difficulties;Other (comment) (soft spoken)  Cognition Arousal/Alertness: Awake/alert Behavior During Therapy: WFL for tasks assessed/performed Overall Cognitive Status: Within Functional Limits for tasks assessed                      General Comments General comments (skin integrity, edema, etc.): Bil LE are edematous and R in very painful    Exercises        Assessment/Plan    PT Assessment Patient needs continued PT services  PT Diagnosis Difficulty walking;Acute pain;Generalized weakness   PT Problem List Decreased strength;Decreased activity tolerance;Decreased balance;Decreased mobility;Pain;Cardiopulmonary status limiting activity;Decreased knowledge of use of DME  PT Treatment Interventions DME instruction;Gait training;Functional mobility training;Therapeutic activities;Patient/family education  PT Goals (Current goals can be found in the Care Plan section) Acute Rehab PT Goals Patient Stated Goal: Get well and be independent again PT Goal Formulation: With patient Time For Goal Achievement: 11/28/13 Potential to Achieve Goals: Good    Frequency Min 3X/week   Barriers to discharge        Co-evaluation               End of Session   Activity Tolerance: Patient tolerated treatment well Patient left: in bed Nurse Communication: Mobility status         Time: 3790-2409 PT Time Calculation (min): 17  min   Charges:   PT Evaluation $Initial PT Evaluation Tier I: 1 Procedure PT Treatments $Gait Training: 8-22 mins   PT G CodesChrissie Noa Ebonye Reade V 11/14/2013, 6:18 PM 11/14/2013  Donnella Sham, PT [phone]  (pager)

## 2013-11-14 NOTE — Progress Notes (Signed)
Samuel CONSULT NOTE - FOLLOW UP  Pharmacy Consult for vancomycin Indication: enterococcal bacteremia  Allergies  Allergen Reactions  . Codeine Anaphylaxis    Patient Measurements: Height: 5\' 9"  (175.3 cm) Weight: 203 lb 9.6 oz (92.352 kg) IBW/kg (Calculated) : 70.7  Vital Signs: Temp: Schultz F (37.1 C) (06/09 0748) Temp src: Oral (06/09 0748) BP: 118/60 mmHg (06/09 0748) Pulse Rate: 90 (06/09 0748) Intake/Output from previous day: 06/08 0701 - 06/09 0700 In: 2934 [P.O.:480; I.V.:1848; Blood:606] Out: 450 [Urine:450] Intake/Output from this shift: Total I/O In: 30 [I.V.:30] Out: -   Labs:  Recent Labs  11/12/13 0415 11/13/13 0520 11/14/13 0350  WBC 4.4 2.8* 4.3  HGB 10.4* 8.6* 9.9*  PLT 48* 31* 40*  CREATININE 0.90  --   --    Estimated Creatinine Clearance: 125 ml/min (by C-G formula based on Cr of 0.9). No results found for this basename: VANCOTROUGH, Corlis Leak, VANCORANDOM, [place], GENTPEAK, GENTRANDOM, TOBRATROUGH, TOBRAPEAK, TOBRARND, AMIKACINPEAK, AMIKACINTROU, AMIKACIN,  in the last 72 hours   Microbiology: Recent Results (from the past 720 hour(s))  CLOSTRIDIUM DIFFICILE BY PCR     Status: None   Collection Time    10/16/13  3:48 PM      Result Value Ref Range Status   C difficile by pcr NEGATIVE  NEGATIVE Final  STOOL CULTURE     Status: None   Collection Time    10/16/13  3:48 PM      Result Value Ref Range Status   Specimen Description STOOL   Final   Special Requests IMMUNE:COMPROMISED   Final   Culture     Final   Value: NO SALMONELLA, SHIGELLA, CAMPYLOBACTER, YERSINIA, OR E.COLI 0157:H7 ISOLATED     Performed at [facility]   Report Status 10/20/2013 FINAL   Final  OVA AND PARASITE EXAMINATION     Status: None   Collection Time    10/16/13  3:48 PM      Result Value Ref Range Status   Specimen Description STOOL   Final   Special Requests IMMUNE:COMPROMISED   Final   Ova and parasites     Final   Value: NO OVA OR  PARASITES SEEN     Performed at [facility]   Report Status 10/17/2013 FINAL   Final  CRYPTOSPORIDIUM SMEAR, FECAL     Status: None   Collection Time    10/16/13  3:48 PM      Result Value Ref Range Status   Specimen Description STOOL   Final   Special Requests NONE   Final   Cryptosporidium Smear.     Final   Value: NO Cryptosporidium Cyclospora or Isospora seen.     Performed at [facility]   Report Status 10/17/2013 FINAL   Final  CULTURE, BLOOD (ROUTINE X 2)     Status: None   Collection Time    10/16/13  6:06 PM      Result Value Ref Range Status   Specimen Description BLOOD RIGHT ANTECUBITAL   Final   Special Requests     Final   Value: BOTTLES DRAWN AEROBIC AND ANAEROBIC 8CC IMMUNE:COMPROMISED   Culture NO GROWTH 5 DAYS   Final   Report Status 10/21/2013 FINAL   Final  CULTURE, BLOOD (ROUTINE X 2)     Status: None   Collection Time    10/16/13  6:11 PM      Result Value Ref Range Status   Specimen Description BLOOD LEFT ANTECUBITAL  Final   Special Requests     Final   Value: BOTTLES DRAWN AEROBIC AND ANAEROBIC 8CC IMMUNE:COMPROMISED   Culture NO GROWTH 5 DAYS   Final   Report Status 10/21/2013 FINAL   Final  STOOL CULTURE     Status: None   Collection Time    11/08/13  2:37 AM      Result Value Ref Range Status   Specimen Description STOOL   Final   Special Requests NONE   Final   Culture     Final   Value: NO SALMONELLA, SHIGELLA, CAMPYLOBACTER, YERSINIA, OR E.COLI 0157:H7 ISOLATED     Note: REDUCED NORMAL FLORA PRESENT     Performed at [facility]   Report Status 11/13/2013 FINAL   Final  OVA AND PARASITE EXAMINATION     Status: None   Collection Time    11/08/13  2:37 AM      Result Value Ref Range Status   Specimen Description STOOL   Final   Special Requests NONE   Final   Ova and parasites     Final   Value: NO OVA OR PARASITES SEEN     Performed at [facility]   Report Status 11/09/2013 FINAL   Final  CLOSTRIDIUM  DIFFICILE BY PCR     Status: None   Collection Time    11/08/13  2:37 AM      Result Value Ref Range Status   C difficile by pcr NEGATIVE  NEGATIVE Final  CULTURE, BLOOD (ROUTINE X 2)     Status: None   Collection Time    11/10/13  4:35 PM      Result Value Ref Range Status   Specimen Description BLOOD LEFT HAND   Final   Special Requests BOTTLES DRAWN AEROBIC ONLY 3CC   Final   Culture  Setup Time     Final   Value: 11/10/2013 22:39     Performed at [facility]   Culture     Final   Value: GRAM POSITIVE COCCI IN CHAINS     Note: Gram Stain Report Called to,Read Back By and Verified With: JILL MOORE 11/11/13 @ 2:40PM BY RUSCOE A.     Performed at [facility]   Report Status PENDING   Incomplete  CULTURE, BLOOD (ROUTINE X 2)     Status: None   Collection Time    11/10/13  4:50 PM      Result Value Ref Range Status   Specimen Description BLOOD LEFT ANTECUBITAL   Final   Special Requests BOTTLES DRAWN AEROBIC ONLY 10CC   Final   Culture  Setup Time     Final   Value: 11/10/2013 22:39     Performed at [facility]   Culture     Final   Value: ENTEROCOCCUS SPECIES     Note: Gram Stain Report Called to,Read Back By and Verified With: JILL MOORE 11/11/13 @ 4:03PM BY RUSCOE A.     Performed at [facility]   Report Status PENDING   Incomplete  AFB CULTURE, BLOOD     Status: None   Collection Time    11/11/13  9:35 AM      Result Value Ref Range Status   Specimen Description BLOOD LEFT HAND   Final   Special Requests 5CC   Final   Culture     Final   Value: CULTURE WILL BE EXAMINED FOR 6 WEEKS BEFORE ISSUING A FINAL REPORT  Performed at [facility]   Report Status PENDING   Incomplete  CLOSTRIDIUM DIFFICILE BY PCR     Status: None   Collection Time    11/11/13  4:18 PM      Result Value Ref Range Status   C difficile by pcr NEGATIVE  NEGATIVE Final  CULTURE, BLOOD (ROUTINE X 2)     Status: None   Collection Time    11/11/13  8:25 PM       Result Value Ref Range Status   Specimen Description BLOOD RIGHT ARM   Final   Special Requests BOTTLES DRAWN AEROBIC AND ANAEROBIC [facility] EACH   Final   Culture  Setup Time     Final   Value: 11/12/2013 01:05     Performed at [facility]   Culture     Final   Value: GRAM POSITIVE COCCI IN CHAINS     Note: Gram Stain Report Called to,Read Back By and Verified With: FRANCIS PLEASANT 11/12/13 @ 8:25PM BY RUSCOE A.     Performed at [facility]   Report Status PENDING   Incomplete  CULTURE, BLOOD (ROUTINE X 2)     Status: None   Collection Time    11/11/13  8:35 PM      Result Value Ref Range Status   Specimen Description BLOOD LEFT HAND   Final   Special Requests BOTTLES DRAWN AEROBIC ONLY 1CC   Final   Culture  Setup Time     Final   Value: 11/12/2013 01:05     Performed at [facility]   Culture     Final   Value:        BLOOD CULTURE RECEIVED NO GROWTH TO DATE CULTURE WILL BE HELD FOR 5 DAYS BEFORE ISSUING A FINAL NEGATIVE REPORT     Performed at [facility]   Report Status PENDING   Incomplete  URINE CULTURE     Status: None   Collection Time    11/12/13 11:22 AM      Result Value Ref Range Status   Specimen Description URINE, CLEAN CATCH   Final   Special Requests Immunocompromised   Final   Culture  Setup Time     Final   Value: 11/12/2013 23:37     Performed at [place]     Final   Value: NO GROWTH     Performed at [facility]   Culture     Final   Value: NO GROWTH     Performed at [facility]   Report Status 11/13/2013 FINAL   Final    Anti-infectives   Start     Dose/Rate Route Frequency Ordered Stop   11/13/13 1615  ampicillin (OMNIPEN) 2 g in sodium chloride 0.9 % 50 mL IVPB     2 g 150 mL/hr over 20 Minutes Intravenous 6 times per day 11/13/13 1611     11/11/13 1830  cefTRIAXone (ROCEPHIN) 2 g in dextrose 5 % 50 mL IVPB  Status:  Discontinued     2 g 100 mL/hr over 30 Minutes  Intravenous Every 24 hours 11/11/13 1744 11/13/13 1611   11/11/13 1745  metroNIDAZOLE (FLAGYL) tablet 500 mg     500 mg Oral 3 times per day 11/11/13 1744     11/11/13 1600  vancomycin (VANCOCIN) IVPB 1000 mg/200 mL premix     1,000 mg 200 mL/hr over 60 Minutes Intravenous Every 12 hours  11/11/13 1555     11/11/13 1000  fluconazole (DIFLUCAN) IVPB 100 mg     100 mg 50 mL/hr over 60 Minutes Intravenous Every 24 hours 11/11/13 0907     11/11/13 0815  metroNIDAZOLE (FLAGYL) IVPB 500 mg  Status:  Discontinued     500 mg 100 mL/hr over 60 Minutes Intravenous Every 8 hours 11/11/13 0801 11/11/13 1144   11/10/13 2145  piperacillin-tazobactam (ZOSYN) IVPB 3.375 g  Status:  Discontinued     3.375 g 12.5 mL/hr over 240 Minutes Intravenous Every 8 hours 11/10/13 2133 11/11/13 1744   11/10/13 1000  azithromycin (ZITHROMAX) tablet 1,200 mg     1,200 mg Oral Weekly 11/07/13 1141     11/10/13 0800  Darunavir Ethanolate (PREZISTA) tablet 800 mg     800 mg Oral Daily with breakfast 11/09/13 1320     11/07/13 1150  sulfamethoxazole-trimethoprim (BACTRIM DS) 800-160 MG per tablet 1 tablet  Status:  Discontinued     1 tablet Oral Daily 11/07/13 1150 11/08/13 1729   11/07/13 0800  ritonavir (NORVIR) capsule 100 mg     100 mg Oral Daily with breakfast 11/06/13 1701     11/06/13 2200  emtricitabine-tenofovir (TRUVADA) 200-300 MG per tablet 1 tablet     1 tablet Oral Daily at bedtime 11/06/13 1701     11/06/13 1715  atovaquone (MEPRON) 750 MG/5ML suspension 1,500 mg     1,500 mg Oral Daily 11/06/13 1701     11/06/13 1715  Darunavir Ethanolate (PREZISTA) tablet 800 mg  Status:  Discontinued     800 mg Oral Daily 11/06/13 1701 11/09/13 1320   11/06/13 1715  metroNIDAZOLE (FLAGYL) IVPB 500 mg  Status:  Discontinued     500 mg 100 mL/hr over 60 Minutes Intravenous Every 8 hours 11/06/13 1710 11/10/13 1511      Assessment: 56 YOM with HIV, on Truvada, DAR/r. Last CD4 count 20, VR 400. Pt on Ampicillin and  Vancomycin for enterococcal bacteremia. No vegetations seen on TEE. Repeat cultures also growing GPC - suspect Portacath involved. Plan to remove today.   Zosyn 6/5>> Flagyl 6/1>> Rocephin 6/6>>6/8 Ampicillin 6/8>> Vanc 6/6>> Diflucan 6/6>> Atovaquone, zithromax prophylaxis  6/5 Blood x 2 - 2/2 enterococcus 6/6 Blood x 2 - 1/2 GPC chains 6/3 stool - Negative 6/3 Cdiff negative  Renal: AKI resolved. SCr down to 0.9. CrCl > 100 ml/min.   Goal of Therapy:  Vancomycin trough level 15-20 mcg/ml  Plan:  - Change Vancomycin to 1000 mg IV Q 8 hrs - f/u renal function and culture results - Will check vancomycin trough if continues - likely will narrow when sensitivities result  Sherlon Handing, PharmD, BCPS Clinical pharmacist, pager [phone]  11/14/2013,10:58 AM

## 2013-11-15 ENCOUNTER — Inpatient Hospital Stay (HOSPITAL_COMMUNITY): Payer: Medicaid Other

## 2013-11-15 DIAGNOSIS — Z1639 Resistance to other specified antimicrobial drug: Secondary | ICD-10-CM

## 2013-11-15 DIAGNOSIS — R7881 Bacteremia: Secondary | ICD-10-CM

## 2013-11-15 DIAGNOSIS — Z1621 Resistance to vancomycin: Secondary | ICD-10-CM

## 2013-11-15 DIAGNOSIS — E43 Unspecified severe protein-calorie malnutrition: Secondary | ICD-10-CM | POA: Insufficient documentation

## 2013-11-15 LAB — PREPARE FRESH FROZEN PLASMA
UNIT DIVISION: 0
Unit division: 0
Unit division: 0

## 2013-11-15 LAB — CBC
HEMATOCRIT: 25.7 % — AB (ref 39.0–52.0)
HEMOGLOBIN: 9 g/dL — AB (ref 13.0–17.0)
MCH: 36.7 pg — ABNORMAL HIGH (ref 26.0–34.0)
MCHC: 35 g/dL (ref 30.0–36.0)
MCV: 104.9 fL — AB (ref 78.0–100.0)
Platelets: 31 10*3/uL — ABNORMAL LOW (ref 150–400)
RBC: 2.45 MIL/uL — AB (ref 4.22–5.81)
RDW: 18 % — ABNORMAL HIGH (ref 11.5–15.5)
WBC: 2.7 10*3/uL — AB (ref 4.0–10.5)

## 2013-11-15 LAB — PROTIME-INR
INR: 2.04 — ABNORMAL HIGH (ref 0.00–1.49)
Prothrombin Time: 22.4 seconds — ABNORMAL HIGH (ref 11.6–15.2)

## 2013-11-15 LAB — BASIC METABOLIC PANEL
BUN: 9 mg/dL (ref 6–23)
CO2: 22 meq/L (ref 19–32)
CREATININE: 0.73 mg/dL (ref 0.50–1.35)
Calcium: 7.5 mg/dL — ABNORMAL LOW (ref 8.4–10.5)
Chloride: 104 mEq/L (ref 96–112)
GFR calc non Af Amer: >90 mL/min (ref >90)
Glucose, Bld: 98 mg/dL (ref 70–99)
Potassium: 3.1 mEq/L — ABNORMAL LOW (ref 3.7–5.3)
Sodium: 137 mEq/L (ref 137–147)

## 2013-11-15 LAB — CMV (CYTOMEGALOVIRUS) DNA ULTRAQUANT, PCR

## 2013-11-15 MED ORDER — VITAMIN K1 10 MG/ML IJ SOLN
10.0000 mg | Freq: Once | INTRAVENOUS | Status: AC
  Administered 2013-11-15: 10 mg via INTRAVENOUS
  Filled 2013-11-15: qty 1

## 2013-11-15 MED ORDER — GADOBENATE DIMEGLUMINE 529 MG/ML IV SOLN
20.0000 mL | Freq: Once | INTRAVENOUS | Status: AC
  Administered 2013-11-15: 19 mL via INTRAVENOUS

## 2013-11-15 MED ORDER — SODIUM CHLORIDE 0.9 % IV SOLN
6.0000 mg/kg | INTRAVENOUS | Status: DC
  Filled 2013-11-15: qty 11.09

## 2013-11-15 MED ORDER — POTASSIUM CHLORIDE 10 MEQ/100ML IV SOLN: 10.0000 meq | INTRAVENOUS | Status: AC

## 2013-11-15 MED ORDER — POTASSIUM CHLORIDE 10 MEQ/100ML IV SOLN
10.0000 meq | INTRAVENOUS | Status: AC
  Administered 2013-11-15 (×4): 10 meq via INTRAVENOUS
  Filled 2013-11-15: qty 100

## 2013-11-15 MED ORDER — SODIUM CHLORIDE 0.9 % IV SOLN
750.0000 mg | INTRAVENOUS | Status: DC
  Administered 2013-11-17 – 2013-11-25 (×9): 750 mg via INTRAVENOUS
  Filled 2013-11-15 (×14): qty 15

## 2013-11-15 MED ORDER — BOOST / RESOURCE BREEZE PO LIQD
1.0000 | Freq: Three times a day (TID) | ORAL | Status: DC
  Administered 2013-11-15 – 2013-11-25 (×25): 1 via ORAL

## 2013-11-15 MED ORDER — SODIUM CHLORIDE 0.9 % IV SOLN
100.0000 mg | Freq: Every day | INTRAVENOUS | Status: DC
  Administered 2013-11-15 – 2013-11-20 (×5): 100 mg via INTRAVENOUS
  Filled 2013-11-15 (×7): qty 100

## 2013-11-15 MED ORDER — CEFTRIAXONE SODIUM 2 G IJ SOLR
2.0000 g | INTRAMUSCULAR | Status: DC
  Administered 2013-11-15: 2 g via INTRAVENOUS
  Filled 2013-11-15 (×3): qty 2

## 2013-11-15 NOTE — Progress Notes (Signed)
NUTRITION FOLLOW UP  Intervention:    Diet advancement per MD   Resource Breeze TID, each supplement providing 250 kcal and 9 grams of protein   Daily multivitamin   RD to continue to follow   Nutrition Dx:   Increased nutrient needs related to HIV/AIDS as evidenced by estimated needs; ongoing   Goal:   Pt to meet >/=90% of estimated nutrition needs; unmet   Monitor:   Diet advancement, supplement acceptance, weight trend, labs   Assessment:   Patient is a 38 y.o. 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 difficile 2 years ago, history of GERD and recent hospitalization for colitis present to emergency department with a chief complaint of intractable nausea vomiting and abdominal pain. 2 days PTA he developed nausea with persistent vomiting. He reported having 10-12 loose stools daily.   Pt recently hospitalized May 2015 for persistent fevers, nausea vomiting and diarrhea. Diagnosed with colitis of unknown etiology.   - Pt advanced to full liquid diet 6/9.  - Pt reports not having anything to drink  - Pt states that he likes resource breeze and would like to receive those - Pt continues to be in much pain and states that he cannot get into or out of bed without help    Height: Ht Readings from Last 1 Encounters:  11/06/13 5\' 9"  (1.753 m)    Weight Status:   Wt Readings from Last 1 Encounters:  11/06/13 203 lb 9.6 oz (92.352 kg)    Re-estimated needs:  Kcal: 2400 - 2600  Protein: 115 - 125 grams  Fluid: >/= 2.7 L/ day   Skin: +3 bilateral LE edema    Diet Order: Full Liquid   Intake/Output Summary (Last 24 hours) at 11/15/13 1031 Last data filed at 11/14/13 2324  Gross per 24 hour  Intake    530 ml  Output   1220 ml  Net   -690 ml    Last BM: 6/9    Labs:   Recent Labs Lab 11/11/13 0510 11/12/13 0415 11/15/13 0510  NA 132* 138 137   K 4.6 3.8 3.1*  CL 104 107 104  CO2 18* 19 22  BUN 34* 23 9  CREATININE 1.47* 0.90 0.73  CALCIUM 7.9* 8.5 7.5*  MG  --  1.6  --   GLUCOSE 64* 81 98    CBG (last 3)  No results found for this basename: GLUCAP,  in the last 72 hours  Scheduled Meds: . atovaquone  1,500 mg Oral Daily  . azithromycin  1,200 mg Oral Weekly  . [START ON 11/16/2013] DAPTOmycin (CUBICIN)  IV  750 mg Intravenous Q24H  . Darunavir Ethanolate  800 mg Oral Q breakfast  . diphenoxylate-atropine  2 tablet Oral TID  . emtricitabine-tenofovir  1 tablet Oral QHS  . fluconazole (DIFLUCAN) IV  100 mg Intravenous Q24H  . furosemide  20 mg Oral Daily  . metroNIDAZOLE  500 mg Oral 3 times per day  . morphine  15 mg Oral BID  . multivitamin with minerals  1 tablet Oral Daily  . nystatin  5 mL Oral QID  . pantoprazole  40 mg Oral BID  . potassium chloride  10 mEq Intravenous Q1 Hr x 4  . ritonavir  100 mg Oral Q breakfast  . spironolactone  50 mg Oral Daily  . sucralfate  1 g Oral TID WC  Continuous Infusions: . dextrose 5 % and 0.9% NaCl 100 mL/hr at 11/15/13 [place], BS Dietetic Intern Pager: [phone]

## 2013-11-15 NOTE — Progress Notes (Signed)
[facility]  ampicillin day # 3 Dapto#1 micafungin #1 Flagyl day # 8  Subjective: remains afebrile. Complains of some mild back pain  Blood cx show +VRE (plus amp S enterococcus) plus yeast   Antibiotics:  Anti-infectives   Start     Dose/Rate Route Frequency Ordered Stop   11/16/13 1000  DAPTOmycin (CUBICIN) 750 mg in sodium chloride 0.9 % IVPB     750 mg 230 mL/hr over 30 Minutes Intravenous Every 24 hours 11/15/13 0854     11/15/13 1200  micafungin (MYCAMINE) 100 mg in sodium chloride 0.9 % 100 mL IVPB     100 mg 100 mL/hr over 1 Hours Intravenous Daily 11/15/13 1110     11/15/13 0845  DAPTOmycin (CUBICIN) 554.5 mg in sodium chloride 0.9 % IVPB  Status:  Discontinued     6 mg/kg  92.4 kg 222.2 mL/hr over 30 Minutes Intravenous Every 24 hours 11/15/13 0844 11/15/13 0854   11/14/13 2000  vancomycin (VANCOCIN) IVPB 1000 mg/200 mL premix  Status:  Discontinued     1,000 mg 200 mL/hr over 60 Minutes Intravenous Every 8 hours 11/14/13 1105 11/15/13 0844   11/14/13 1200  fluconazole (DIFLUCAN) IVPB 100 mg  Status:  Discontinued     100 mg 50 mL/hr over 60 Minutes Intravenous Every 24 hours 11/14/13 1101 11/15/13 1110   11/13/13 1615  ampicillin (OMNIPEN) 2 g in sodium chloride 0.9 % 50 mL IVPB  Status:  Discontinued     2 g 150 mL/hr over 20 Minutes Intravenous 6 times per day 11/13/13 1611 11/15/13 0844   11/11/13 1830  cefTRIAXone (ROCEPHIN) 2 g in dextrose 5 % 50 mL IVPB  Status:  Discontinued     2 g 100 mL/hr over 30 Minutes Intravenous Every 24 hours 11/11/13 1744 11/13/13 1611   11/11/13 1745  metroNIDAZOLE (FLAGYL) tablet 500 mg     500 mg Oral 3 times per day 11/11/13 1744     11/11/13 1600  vancomycin (VANCOCIN) IVPB 1000 mg/200 mL premix  Status:  Discontinued     1,000 mg 200 mL/hr over 60 Minutes Intravenous Every 12 hours 11/11/13 1555 11/14/13 1105   11/11/13 1000  fluconazole (DIFLUCAN) IVPB 100 mg  Status:  Discontinued     100  mg 50 mL/hr over 60 Minutes Intravenous Every 24 hours 11/11/13 0907 11/14/13 1101   11/11/13 0815  metroNIDAZOLE (FLAGYL) IVPB 500 mg  Status:  Discontinued     500 mg 100 mL/hr over 60 Minutes Intravenous Every 8 hours 11/11/13 0801 11/11/13 1144   11/10/13 2145  piperacillin-tazobactam (ZOSYN) IVPB 3.375 g  Status:  Discontinued     3.375 g 12.5 mL/hr over 240 Minutes Intravenous Every 8 hours 11/10/13 2133 11/11/13 1744   11/10/13 1000  azithromycin (ZITHROMAX) tablet 1,200 mg     1,200 mg Oral Weekly 11/07/13 1141     11/10/13 0800  Darunavir Ethanolate (PREZISTA) tablet 800 mg     800 mg Oral Daily with breakfast 11/09/13 1320     11/07/13 1150  sulfamethoxazole-trimethoprim (BACTRIM DS) 800-160 MG per tablet 1 tablet  Status:  Discontinued     1 tablet Oral Daily 11/07/13 1150 11/08/13 1729   11/07/13 0800  ritonavir (NORVIR) tablet 100 mg     100 mg Oral Daily with breakfast 11/06/13 1701     11/06/13 2200  emtricitabine-tenofovir (TRUVADA) 200-300 MG per tablet 1 tablet     1 tablet Oral Daily at bedtime 11/06/13 1701  11/06/13 1715  atovaquone (MEPRON) 750 MG/5ML suspension 1,500 mg     1,500 mg Oral Daily 11/06/13 1701     11/06/13 1715  Darunavir Ethanolate (PREZISTA) tablet 800 mg  Status:  Discontinued     800 mg Oral Daily 11/06/13 1701 11/09/13 1320   11/06/13 1715  metroNIDAZOLE (FLAGYL) IVPB 500 mg  Status:  Discontinued     500 mg 100 mL/hr over 60 Minutes Intravenous Every 8 hours 11/06/13 1710 11/10/13 1511      Medications: Scheduled Meds: . atovaquone  1,500 mg Oral Daily  . azithromycin  1,200 mg Oral Weekly  . [START ON 11/16/2013] DAPTOmycin (CUBICIN)  IV  750 mg Intravenous Q24H  . Darunavir Ethanolate  800 mg Oral Q breakfast  . diphenoxylate-atropine  2 tablet Oral TID  . emtricitabine-tenofovir  1 tablet Oral QHS  . feeding supplement (RESOURCE BREEZE)  1 Container Oral TID BM  . furosemide  20 mg Oral Daily  . metroNIDAZOLE  500 mg Oral 3 times  per day  . micafungin [facility]) IV  100 mg Intravenous Daily  . morphine  15 mg Oral BID  . multivitamin with minerals  1 tablet Oral Daily  . nystatin  5 mL Oral QID  . pantoprazole  40 mg Oral BID  . potassium chloride  10 mEq Intravenous Q1 Hr x 4  . ritonavir  100 mg Oral Q breakfast  . spironolactone  50 mg Oral Daily  . sucralfate  1 g Oral TID WC     Objective: Weight change:   Intake/Output Summary (Last 24 hours) at 11/15/13 1742 Last data filed at 11/15/13 1605  Gross per 24 hour  Intake   1092 ml  Output   2970 ml  Net  -1878 ml   Blood pressure 125/65, pulse 86, temperature 98.4 F (36.9 C), temperature source Oral, resp. rate 18, height 5\' 9"  (1.753 m), weight 203 lb 9.6 oz (92.352 kg), SpO2 100.00%. Temp:  [98.3 F (36.8 C)-99.5 F (37.5 C)] 98.4 F (36.9 C) (06/10 1500) Pulse Rate:  [80-90] 86 (06/10 1500) Resp:  [16-20] 18 (06/10 1500) BP: (101-125)/(54-72) 125/65 mmHg (06/10 1500) SpO2:  [93 %-100 %] 100 % (06/10 1500) General: Alert and awake, oriented x3,  HEENT: EOMI  [facility] regular rate, normal r, no murmur rubs or gallops  Chest: clear to auscultation bilaterally, no wheezing, rales or rhonchi  Abdomen: soft tender diffusely distended  Neuro: nonfocal   CBC:  Recent Labs Lab 11/11/13 0510 11/11/13 0935 11/12/13 0415 11/13/13 0520 11/14/13 0350 11/14/13 0900 11/15/13 0510 11/15/13 1000  HGB 9.4*  --  10.4* 8.6* 9.9*  --  9.0*  --   HCT 26.7*  --  29.8* 24.4* 28.8*  --  25.7*  --   PLT 30*  --  48* 31* 40*  --  31*  --   INR  --  2.61* 2.23* 2.02*  --  2.03*  --  2.04*     BMET  Recent Labs  11/15/13 0510  NA 137  K 3.1*  CL 104  CO2 22  GLUCOSE 98  BUN 9  CREATININE 0.73  CALCIUM 7.5*      Micro Results: Recent Results (from the past 240 hour(s))  STOOL CULTURE     Status: None   Collection Time    11/08/13  2:37 AM      Result Value Ref Range Status   Specimen Description STOOL   Final   Special Requests NONE  Final   Culture     Final   Value: NO SALMONELLA, SHIGELLA, CAMPYLOBACTER, YERSINIA, OR E.COLI 0157:H7 ISOLATED     Note: REDUCED NORMAL FLORA PRESENT     Performed at [facility]   Report Status 11/13/2013 FINAL   Final  OVA AND PARASITE EXAMINATION     Status: None   Collection Time    11/08/13  2:37 AM      Result Value Ref Range Status   Specimen Description STOOL   Final   Special Requests NONE   Final   Ova and parasites     Final   Value: NO OVA OR PARASITES SEEN     Performed at [facility]   Report Status 11/09/2013 FINAL   Final  CLOSTRIDIUM DIFFICILE BY PCR     Status: None   Collection Time    11/08/13  2:37 AM      Result Value Ref Range Status   C difficile by pcr NEGATIVE  NEGATIVE Final  CULTURE, BLOOD (ROUTINE X 2)     Status: None   Collection Time    11/10/13  4:35 PM      Result Value Ref Range Status   Specimen Description BLOOD LEFT HAND   Final   Special Requests BOTTLES DRAWN AEROBIC ONLY 3CC   Final   Culture  Setup Time     Final   Value: 11/10/2013 22:39     Performed at [facility]   Culture     Final   Value: ENTEROCOCCUS SPECIES     Note: SUSCEPTIBILITIES PERFORMED ON PREVIOUS CULTURE WITHIN THE LAST 5 DAYS.     Note: Gram Stain Report Called to,Read Back By and Verified With: JILL MOORE 11/11/13 @ 2:40PM BY RUSCOE A.     Performed at [facility]   Report Status 11/14/2013 FINAL   Final  CULTURE, BLOOD (ROUTINE X 2)     Status: None   Collection Time    11/10/13  4:50 PM      Result Value Ref Range Status   Specimen Description BLOOD LEFT ANTECUBITAL   Final   Special Requests BOTTLES DRAWN AEROBIC ONLY 10CC   Final   Culture  Setup Time     Final   Value: 11/10/2013 22:39     Performed at [facility]   Culture     Final   Value: ENTEROCOCCUS SPECIES     Note: Gram Stain Report Called to,Read Back By and Verified With: JILL MOORE 11/11/13 @ 4:03PM BY RUSCOE A.     Performed at [facility]    Report Status 11/14/2013 FINAL   Final   Organism ID, Bacteria ENTEROCOCCUS SPECIES   Final  AFB CULTURE, BLOOD     Status: None   Collection Time    11/11/13  9:35 AM      Result Value Ref Range Status   Specimen Description BLOOD LEFT HAND   Final   Special Requests 5CC   Final   Culture     Final   Value: YEAST ISOLATED;ID TO FOLLOW     Note: CRITICAL RESULT CALLED TO, READ BACK BY AND VERIFIED WITH: BRENDA RN @ 10:11AM ON 11/15/13 BY WILSN     Performed at [facility]   Report Status PENDING   Incomplete  CLOSTRIDIUM DIFFICILE BY PCR     Status: None   Collection Time    11/11/13  4:18 PM      Result  Value Ref Range Status   C difficile by pcr NEGATIVE  NEGATIVE Final  CULTURE, BLOOD (ROUTINE X 2)     Status: None   Collection Time    11/11/13  8:25 PM      Result Value Ref Range Status   Specimen Description BLOOD RIGHT ARM   Final   Special Requests BOTTLES DRAWN AEROBIC AND ANAEROBIC [facility] EACH   Final   Culture  Setup Time     Final   Value: 11/12/2013 01:05     Performed at [facility]   Culture     Final   Value: VANCOMYCIN RESISTANT ENTEROCOCCUS ISOLATED     Note: LINEZOLID TO FOLLOW CRITICAL RESULT CALLED TO, READ BACK BY AND VERIFIED WITH: BRENDA HALL 11/15/13 0815 BY SMITHERSJ     Note: Gram Stain Report Called to,Read Back By and Verified With: FRANCIS PLEASANT 11/12/13 @ 8:25PM BY RUSCOE A.     Performed at [facility]   Report Status PENDING   Incomplete   Organism ID, Bacteria VANCOMYCIN RESISTANT ENTEROCOCCUS ISOLATED   Final  CULTURE, BLOOD (ROUTINE X 2)     Status: None   Collection Time    11/11/13  8:35 PM      Result Value Ref Range Status   Specimen Description BLOOD LEFT HAND   Final   Special Requests BOTTLES DRAWN AEROBIC ONLY 1CC   Final   Culture  Setup Time     Final   Value: 11/12/2013 01:05     Performed at [facility]   Culture     Final   Value:        BLOOD CULTURE RECEIVED NO GROWTH TO DATE CULTURE WILL  BE HELD FOR 5 DAYS BEFORE ISSUING A FINAL NEGATIVE REPORT     Performed at [facility]   Report Status PENDING   Incomplete  URINE CULTURE     Status: None   Collection Time    11/12/13 11:22 AM      Result Value Ref Range Status   Specimen Description URINE, CLEAN CATCH   Final   Special Requests Immunocompromised   Final   Culture  Setup Time     Final   Value: 11/12/2013 23:37     Performed at [place]     Final   Value: NO GROWTH     Performed at [facility]   Culture     Final   Value: NO GROWTH     Performed at [facility]   Report Status 11/13/2013 FINAL   Final    Studies/Results: No results found.    Assessment/Plan:  Principal Problem:   Nausea and vomiting in adult Active Problems:   AIDS   Large cell lymphoma   Cirrhosis, non-alcoholic   Pancytopenia   Enteritis   Other pancytopenia   Acute kidney injury   Hypotension, unspecified   Sepsis   Hypoglycemia   Other and unspecified noninfectious gastroenteritis and colitis(558.9)   Gram-positive cocci bacteremia   NSVT (nonsustained ventricular tachycardia)   Protein-calorie malnutrition, severe   VRE bacteremia    Samuel Schultz is a 38 y.o. male with  HIV/AIDS, hx of large cell lymphoma, currently with VL down but cd4 still low problems with severe diarrhea extensive workup at [facility] including colonoscopy  admitted to [facility] with development of extensive colonic wall thickening involving the cecum and ascending colon," concerning for colitis" with negative C diff PCR  again. Found to have enterococcal bacteremia and fungemia, TEE negative, but portacath likely involved due to repeated cultures being positive  #1 enterococcal bacteremia: likely translocated from colonic involvement. Since  TEE negative, important to get hisport removed  -- will cover both VRE as well as amp S enterococcus with daptomycin 8mg /kg/day -- will need repeat blood cx to  document clearance before new line can be placed  #2  fugnemia = will start micafungin. Likely port involvement, agree with Dr. Carles Collet removal is needed  #3Diffuse colitis: he recently underwent colonoscopy with biopsy on 5/22 that did not show abnormality, thus colonic inflammation is thought to be due to hiv, as a diagnosis of exclusion, diarrhea will continue to give lomotil to help with perfuse diarrhea. Will switch back to ceftiraxone to give gram negative coverage for colitis  #4HIV = continue on his current regimen plus oi proph      LOS: 9 days   Chandra Feger 11/15/2013, 5:42 PM

## 2013-11-15 NOTE — Progress Notes (Signed)
Patient ID: Samuel Schultz, male   DOB: April 03, 1976, 37 y.o.   MRN: [id number]   PAC removal with IR on hold for now  INR still 2.04 today Plts 31 today  See previous note--  Need INR 1.7 Would like plts to be higher (we can hang some plts prior to procedure too)  We will monitor

## 2013-11-15 NOTE — Progress Notes (Signed)
Intern note/chart reviewed. Revisions made.  Inaara Tye RD, LDN, CNSC [phone] Pager [phone] After Hours Pager  

## 2013-11-15 NOTE — Progress Notes (Signed)
Intern note/chart reviewed. Revisions made.  Caide Campi RD, LDN, CNSC [phone] Pager [phone] After Hours Pager  

## 2013-11-15 NOTE — Progress Notes (Signed)
PROGRESS NOTE  Samuel Schultz NAT:[id number] DOB: [birth year] DOA: 11/06/2013 PCP: PROVIDER NOT IN SYSTEM  Assessment/Plan: Persistent colitis  -possibly related to AIDS given recurrent symptoms for over 3 weeks  - repeat stool for C.diff negative, stool culture, ova and parasites negative. Diarrhea has somewhat improved on Lomotil but still has abdominal pain. -Flagyl discontinued on 6/5 but was restarted even worsened colitis seen on repeat CT scan..  -Patient continues to have diarrhea although frequency had reduced in last 3 days. -No nausea or vomiting but has persistent abdominal pain. Lactic acid normalized.   -IV hydration and clear liquid diet. GI consulted. No further workup recommended recent negative workups including studies and CMV culture from colon biopsy.  -Continue Lomotil for symptomatic treatment -Colonoscopy 10/27/2013 negative -10/16/13 stool microsporidia negative VRE bacteremia  -Etiology do to translocation of bacteria across GI tract from colitis -Discontinue vancomycin and ampicillin  -Start Cubicin secondary to the patient's thrombocytopenia--approved by Dr. Tommy Medal -On 6/5 patient was hypotensive and hypoglycemic. Repeat CT scan of the abdomen showed worsening colitis. Blood culture 2/2 on 6/5 growing enterococcus. Placed on IV Rocephin. Continue vancomycin and Flagyl. Patient has a right-sided Port-A-Cath for chemotherapy placed few years back .  - TEE on 6/8 which was negative for vegetation or thrombus. 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  -Will followup with blood culture sensitivity and determine course of abx.  -Plan to remove the Port-A-Cath however has a low platelet and high INR which needs to be corrected.  -Ordered vitamin K and will order 3 more units of FFP 11/15/13 ( has received 6 u FFP already.) Back pain -MRI back to r/o seeding from bacteremia  HIV/AIDS on HAART  - appreciate ID consult and recommendations  - pt is on ART . CD4 count of 20  - also on  azithromycin and bactrim for PCP and MAC prophylaxis .monitor Qtc. -Continue mepron  NSVT on 6/7  low magnesium corrected. EKG within normal limits. No further episodes.  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 Bone marrow bx done at [facility] recently negative for lymphoma  Pancytopenia  - likely bone marrow suppression from HIV/AIDS and liver cirrhosis . No lymphoma seen on recent bone marrow bx done at [facility].  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 Has thrombocytopenia and coagulopathy.  Depression  Continue Lexapro.  Liver cirrhosis  Resumed Aldactone and Lasix.  AKI  Secondary to dehydration. Resolved.  Oral thrush  On IV fluconazole. Added nystatin swish and swallow    DVT prophylaxis: SCD  Code Status: full code  Family Communication: none at bedside  Disposition Plan: PT eval. May need SNF          Procedures/Studies: Dg Abd 1 View  11/12/2013   CLINICAL DATA:  Colitis.  EXAM: ABDOMEN - 1 VIEW  COMPARISON:  CT of the abdomen and pelvis 11/10/2013.  FINDINGS: Oral contrast material is noted throughout the colon and distal rectum. No pathologic distention of small bowel. No pneumoperitoneum. Multiple surgical clips project over the right upper quadrant of the abdomen. Splenic contour appears enlarged.  IMPRESSION: 1. Nonobstructive bowel gas pattern. 2. No pneumoperitoneum. 3. Splenomegaly.   Electronically Signed   By: Vinnie Langton M.D.   On: 11/12/2013 11:47   Ct Head Wo Contrast  11/10/2013   CLINICAL DATA:  Persistent abdominal pain.  Dizziness.  Headache.  EXAM: CT HEAD WITHOUT CONTRAST  TECHNIQUE: Contiguous axial images were obtained from the base of the skull through the vertex without intravenous contrast.  COMPARISON:  No priors.  FINDINGS: No acute intracranial abnormalities. Specifically, no evidence of acute  intracranial hemorrhage, no definite findings of acute/subacute cerebral ischemia, no mass, mass effect, hydrocephalus or abnormal intra or extra-axial fluid collections. Visualized paranasal sinuses and mastoids are well pneumatized. No acute displaced skull fractures are identified.  IMPRESSION: *No acute intracranial abnormalities. *The appearance of the brain is normal.   Electronically Signed   By: Vinnie Langton M.D.   On: 11/10/2013 19:43   Ct Abdomen Pelvis W Contrast  11/10/2013   CLINICAL DATA:  Persistent abdominal pain.  EXAM: CT ABDOMEN AND PELVIS WITH CONTRAST  TECHNIQUE: Multidetector CT imaging of the abdomen and pelvis was performed using the standard protocol following bolus administration of intravenous contrast.  CONTRAST:  132mL OMNIPAQUE IOHEXOL 300 MG/ML  SOLN  COMPARISON:  CT of the abdomen and pelvis 10/22/2013.  FINDINGS: Lung Bases: Linear opacities throughout the lung bases bilaterally, predominantly in a peribronchovascular distribution, favored to represent areas of peribronchial inflammation, potentially related to recent recurrent bouts of aspiration. Markedly thickened distal esophagus. Numerous serpiginous structures adjacent to the distal esophagus, presumably esophageal varices. Tip of a porta cath in the right atrium.  Abdomen/Pelvis: The liver has a shrunken appearance and nodular contour, compatible with advanced cirrhosis. No definite focal hepatic lesions are noted on today's limited noncontrast CT examination. Numerous surgical clips are noted adjacent to the liver. Status post cholecystectomy. Potential stone or migrated surgical clip in the proximal aspect of the common bile duct demonstrated on images 19 and 20 of series 2, unchanged. The portal vein is dilated measuring 18 mm. The spleen is massively enlarged measuring 18.6 x 14.1 x 17.7 cm (estimated splenic volume of 2,321 mL).  New compared to the recent prior examination there is marked thickening of the colonic  wall involving the cecum and ascending colon. These findings are concerning for colitis. More distal aspect of the colon appears uninvolved. No significant volume of ascites. No pneumoperitoneum. No pathologic distention of small bowel. No definite lymphadenopathy identified within the abdomen or pelvis on today's non contrast CT examination. Prostate gland and urinary bladder are unremarkable in appearance. Small umbilical hernia containing only omental fat incidentally noted (unchanged).  Musculoskeletal: Multifocal sclerotic lesions are noted throughout the visualized axial and appendicular skeleton, most apparent within the sacrum at the level of S1, in the right side of the L5 vertebral body, and scattered throughout the remainder of the spine. These findings are similar to prior examinations. At T10 and T11 there is mild compression which is unchanged, with approximately 15-20% loss of height anteriorly at both vertebral body levels. Edema throughout the subcutaneous fat of the flanks bilaterally (right greater than left).  IMPRESSION: 1. Interval development of extensive colonic wall thickening involving the cecum and ascending colon, concerning for colitis. Clinical correlation is recommended. 2. Stigmata of cirrhosis and portal hypertension, including massive splenomegaly and esophageal varices redemonstrated, as above. 3. The appearance of the lung bases suggest sequela of multiple recent bouts of aspiration. 4. Small umbilical hernia containing only omental fat incidentally noted. 5. 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 6. Additional incidental findings, as above.   Electronically Signed   By: Vinnie Langton M.D.   On: 11/10/2013 19:56   Ct Abdomen  Pelvis W Contrast  11/07/2013   CLINICAL DATA:  Abdominal pain  EXAM: CT ABDOMEN AND PELVIS WITH CONTRAST  TECHNIQUE: Multidetector CT  imaging of the abdomen and pelvis was performed using the standard protocol following bolus administration of intravenous contrast.  CONTRAST:  43mL OMNIPAQUE IOHEXOL 300 MG/ML  SOLN  COMPARISON:  10/22/2013  FINDINGS: Cirrhotic liver.  Portal vein is grossly patent.  Bibasilar atelectasis.  Splenomegaly.  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  Right pleural effusion and abdominal ascites have resolved. Trace amount of fluid is layering in the pelvis.  Pancreas, kidneys, adrenal glands are within normal limits.  Umbilical hernia containing adipose tissue is stable.  There are areas of wall thickening in the colon. This occurs in the ascending colon on image 53, transverse colon on image 39, and possibly within the sigmoid colon, although the sigmoid colon is decompressed.  Bladder and prostate are unremarkable.  Sclerotic changes within thoracic and lumbar vertebral bodies is not significantly changed. This may be associated with the patient's known history of lymphoma. Lytic areas in the iliac bones are also noted.  IMPRESSION: Cirrhotic liver.  Stable splenomegaly.  There are areas of wall thickening throughout the colon. Differential diagnosis includes an inflammatory process or malignancy.  Pleural effusions and abdominal ascites have resolved. Trace fluid persist in the pelvis.   Electronically Signed   By: Maryclare Bean M.D.   On: 11/07/2013 16:11   Ct Abdomen Pelvis W Contrast  10/22/2013   CLINICAL DATA:  Lymphoma, prior cholecystectomy and appendectomy. Status post bone marrow transplant. Abdominal pain and lactic acidosis.  EXAM: CT ABDOMEN AND PELVIS WITH CONTRAST  TECHNIQUE: Multidetector CT imaging of the abdomen and pelvis was performed using the standard protocol following bolus administration of intravenous contrast.  CONTRAST:  132mL OMNIPAQUE IOHEXOL 300 MG/ML  SOLN  COMPARISON:  CT ABD - PELV W/ CM dated 10/16/2013  FINDINGS: Trace pleural effusions are increased since previously, with  bibasilar dependent atelectasis.  Splenomegaly is reidentified. Nodular hepatic contour reidentified with abdominal ascites in all 4 quadrants. Interval increase in soft tissue edema compatible with third spacing. Mild irregularity of the omental fat adjacent to the anterior segment right hepatic lobe with adjacent surgical clips noted, image 14. Upper abdominal vascular collaterals likely indicate portal hypertension. Portal vein not visualized, which may indicate chronic thrombosis.  Fat containing umbilical hernia noted. Foci of gas within the anterior abdominal wall subcutaneous fat likely indicating injection locations. Improvement in colonic wall thickening is identified. Adrenal glands, spleen, and kidneys are unremarkable. Cholecystectomy clips are noted. No free air. Small retroperitoneal nodes are reidentified, largest 5 mm in the aortocaval space for example image 33. Bladder is normal. Multiple axial sclerotic osseous lesions are reidentified.  IMPRESSION: Although colonic bowel wall thickening has improved since the previous exam, there has been interval worsening of ascites, pleural effusions, and subcutaneous soft tissue edema. 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  Right upper quadrant omental nodularity. Omental disease could have this appearance although postsurgical change is also possible given the presence of clips in this area.  Axial skeletal sclerotic lesions suspicious for metastatic disease.   Electronically Signed   By: Conchita Paris M.D.   On: 10/22/2013 13:04   Ct Abdomen Pelvis W Contrast  10/16/2013   CLINICAL DATA:  Abdomen pain in nausea vomiting since Saturday. History of lymphoma.  EXAM: CT ABDOMEN AND PELVIS WITH CONTRAST  TECHNIQUE: Multidetector CT imaging of the abdomen and pelvis was performed using the standard protocol following bolus administration of  intravenous contrast.  CONTRAST:  166mL OMNIPAQUE IOHEXOL 300 MG/ML  SOLN  COMPARISON:  None.  FINDINGS: There is diffuse fatty infiltration the liver. There is mild nodular contour with enlargement of left lobe liver. There are enlarged varices at the splenic hilum. The spleen is enlarged measuring 17.3 cm in length. There is ascites in the abdomen and pelvis. The patient is status post prior cholecystectomy. There are surgical clips surrounding the lateral periphery of the liver. The pancreas is normal. The adrenal glands and kidneys are normal. There is no hydronephrosis bilaterally. There is no abdominal lymphadenopathy. There is umbilical herniation of mesenteric fat.  The small bowel loops are mildly prominent without evidence of frank obstruction. There is diffuse bowel wall thickening of throughout the colon. The patient is status post prior appendectomy.  Free fluid is identified within the pelvis. The bladder is decompressed limiting evaluation. There is no pelvic lymphadenopathy.  Images of the lung bases demonstrate mild atelectasis of the posterior lung bases. There is no pulmonary mass or pleural effusion. Images of the bones demonstrate patchy increased sclerosis at T7, T10, T11, L3, L5, S1 vertebral bodies as well as mixed sclerotic and lytic lucencies in bilateral ilium.  IMPRESSION: Diffuse bowel wall thickening throughout the colon. The findings can be seen in colitis either infectious or inflammatory. Vascular ischemia is much less likely.  Changes of cirrhosis of liver for as described.  Splenomegaly.  Findings suspicious for bone metastasis.   Electronically Signed   By: Abelardo Diesel M.D.   On: 10/16/2013 16:07   US Venous Img Upper Uni Right  10/20/2013   CLINICAL DATA:  Right arm swelling  EXAM: RIGHT UPPER EXTREMITY VENOUS DOPPLER ULTRASOUND  TECHNIQUE: Gray-scale sonography with graded compression, as well as color Doppler and duplex ultrasound were performed to evaluate the upper  extremity deep venous system from the level of the subclavian vein and including the jugular, axillary, basilic and upper cephalic vein. Spectral Doppler was utilized to evaluate flow at rest and with distal augmentation maneuvers.  COMPARISON:  None.  FINDINGS: Thrombus within deep veins:  None visualized.  Compressibility of deep veins:  Normal.  Duplex waveform respiratory phasicity:  Normal.  Duplex waveform response to augmentation:  Normal.  Venous reflux:  None visualized.  Other findings: There is nonocclusive, incompletely compressible mural thrombus in the cephalic vein at the level of the elbow with patent persistent flow through this segment. The more central aspect of the cephalic vein is widely patent and compressible. There is subcutaneous edema in the wrist and upper arm.  IMPRESSION: 1. Negative for right upper extremity DVT. 2. Segmental superficial thrombophlebitis involving cephalic vein.   Electronically Signed   By: Arne Cleveland M.D.   On: 10/20/2013 11:45   Dg Chest Portable 1 View  10/16/2013   CLINICAL DATA:  Cough; lymphoma  EXAM: PORTABLE CHEST - 1 VIEW  COMPARISON:  None.  FINDINGS: Lungs are clear. Heart is upper normal in size with normal pulmonary vascularity. Central catheter tip is in the right atrium. No pneumothorax. No adenopathy. No bone lesions.  IMPRESSION: No edema or consolidation.  No pneumothorax.   Electronically Signed   By: Lowella Grip M.D.   On: 10/16/2013 12:08   Dg Abd Acute W/chest  11/06/2013   CLINICAL DATA:  Fever and emesis ; lymphoma  EXAM: ACUTE ABDOMEN SERIES (ABDOMEN 2 VIEW &  CHEST 1 VIEW)  COMPARISON:  Chest radiograph Oct 16, 2013; CT abdomen and pelvis Oct 22, 2013  FINDINGS: PA chest: There is no edema or consolidation. Heart size and pulmonary vascularity are normal. No adenopathy. Port-A-Cath tip is at the cavoatrial junction. No pneumothorax.  Supine and upright abdomen: There remains thickening of scattered bowel loops. There are  scattered air-fluid levels. There is no well-defined obstruction or free air. There is moderate stool in the colon. There are surgical clips in the right upper quadrant.  IMPRESSION: There remains some wall thickening of several loops of bowel which given the history, may have neoplastic etiology. Scattered air-fluid levels may represent ileus or enteritis. Early obstruction is a consideration, although enteritis or ileus with tend to be more likely given the overall appearance of the bowel. No free air is seen.  There is no lung edema or consolidation.   Electronically Signed   By: Lowella Grip M.D.   On: 11/06/2013 11:22         Subjective:  patient complains of low back pain for the past 2 days. Denies any nausea, vomiting. Continues to have loose stools but more substance. No hematochezia or melena. No dysuria or hematuria. Denies any headache or neck pain. Denies any chest pain or shortness of breath.  Objective: Filed Vitals:   11/15/13 0530 11/15/13 0545 11/15/13 0645 11/15/13 0745  BP: 101/54 101/54 101/60 113/67  Pulse: 82 80 86 81  Temp: 98.3 F (36.8 C) 98.4 F (36.9 C) 99.1 F (37.3 C) 98.6 F (37 C)  TempSrc: Oral Oral Oral Oral  Resp: 18 18 18 18   Height:      Weight:      SpO2: 94% 93% 94% 95%    Intake/Output Summary (Last 24 hours) at 11/15/13 0840 Last data filed at 11/14/13 2324  Gross per 24 hour  Intake    530 ml  Output   1220 ml  Net   -690 ml   Weight change:  Exam:   General:  Pt is alert, follows commands appropriately, not in acute distress  HEENT: No icterus, No thrush, [place]/AT  Cardiovascular: RRR, S1/S2, no rubs, no gallops  Respiratory: CTA bilaterally, no wheezing, no crackles, no rhonchi  Abdomen: Soft/+BS, non tender, non distended, no guarding  Extremities: 2+LE edema, No lymphangitis, No petechiae, No rashes, no synovitis  Data Reviewed: Basic Metabolic Panel:  Recent Labs Lab 11/09/13 1125 11/10/13 0505 11/11/13 0510  11/12/13 0415 11/15/13 0510  NA 132* 132* 132* 138 137  K 4.3 4.3 4.6 3.8 3.1*  CL 104 105 104 107 104  CO2 17* 19 18* 19 22  GLUCOSE 99 84 64* 81 98  BUN 26* 25* 34* 23 9  CREATININE 1.86* 1.22 1.47* 0.90 0.73  CALCIUM 7.8* 7.9* 7.9* 8.5 7.5*  MG  --   --   --  1.6  --    Liver Function Tests:  Recent Labs Lab 11/08/13 0940  AST 52*  ALT 18  ALKPHOS 81  BILITOT 1.8*  PROT 5.9*  ALBUMIN 2.1*   No results found for this basename: LIPASE, AMYLASE,  in the last 168 hours No results found for this basename: AMMONIA,  in the last 168 hours CBC:  Recent Labs Lab 11/11/13 0510 11/12/13 0415 11/13/13 0520 11/14/13 0350 11/15/13 0510  WBC 2.8* 4.4 2.8* 4.3 2.7*  HGB 9.4* 10.4* 8.6* 9.9* 9.0*  HCT 26.7* 29.8* 24.4* 28.8* 25.7*  MCV 102.7* 101.7* 101.2* 102.9* 104.9*  PLT 30* 48*  31* 40* 31*   Cardiac Enzymes: No results found for this basename: CKTOTAL, CKMB, CKMBINDEX, TROPONINI,  in the last 168 hours BNP: No components found with this basename: POCBNP,  CBG:  Recent Labs Lab 11/10/13 0853 11/10/13 0917 11/10/13 0928 11/10/13 1505  GLUCAP 61* 71 73 91    Recent Results (from the past 240 hour(s))  STOOL CULTURE     Status: None   Collection Time    11/08/13  2:37 AM      Result Value Ref Range Status   Specimen Description STOOL   Final   Special Requests NONE   Final   Culture     Final   Value: NO SALMONELLA, SHIGELLA, CAMPYLOBACTER, YERSINIA, OR E.COLI 0157:H7 ISOLATED     Note: REDUCED NORMAL FLORA PRESENT     Performed at [facility]   Report Status 11/13/2013 FINAL   Final  OVA AND PARASITE EXAMINATION     Status: None   Collection Time    11/08/13  2:37 AM      Result Value Ref Range Status   Specimen Description STOOL   Final   Special Requests NONE   Final   Ova and parasites     Final   Value: NO OVA OR PARASITES SEEN     Performed at [facility]   Report Status 11/09/2013 FINAL   Final  CLOSTRIDIUM DIFFICILE BY PCR      Status: None   Collection Time    11/08/13  2:37 AM      Result Value Ref Range Status   C difficile by pcr NEGATIVE  NEGATIVE Final  CULTURE, BLOOD (ROUTINE X 2)     Status: None   Collection Time    11/10/13  4:35 PM      Result Value Ref Range Status   Specimen Description BLOOD LEFT HAND   Final   Special Requests BOTTLES DRAWN AEROBIC ONLY 3CC   Final   Culture  Setup Time     Final   Value: 11/10/2013 22:39     Performed at [facility]   Culture     Final   Value: ENTEROCOCCUS SPECIES     Note: SUSCEPTIBILITIES PERFORMED ON PREVIOUS CULTURE WITHIN THE LAST 5 DAYS.     Note: Gram Stain Report Called to,Read Back By and Verified With: JILL MOORE 11/11/13 @ 2:40PM BY RUSCOE A.     Performed at [facility]   Report Status 11/14/2013 FINAL   Final  CULTURE, BLOOD (ROUTINE X 2)     Status: None   Collection Time    11/10/13  4:50 PM      Result Value Ref Range Status   Specimen Description BLOOD LEFT ANTECUBITAL   Final   Special Requests BOTTLES DRAWN AEROBIC ONLY 10CC   Final   Culture  Setup Time     Final   Value: 11/10/2013 22:39     Performed at [facility]   Culture     Final   Value: ENTEROCOCCUS SPECIES     Note: Gram Stain Report Called to,Read Back By and Verified With: JILL MOORE 11/11/13 @ 4:03PM BY RUSCOE A.     Performed at [facility]   Report Status 11/14/2013 FINAL   Final   Organism ID, Bacteria ENTEROCOCCUS SPECIES   Final  AFB CULTURE, BLOOD     Status: None   Collection Time    11/11/13  9:35 AM  Result Value Ref Range Status   Specimen Description BLOOD LEFT HAND   Final   Special Requests 5CC   Final   Culture     Final   Value: CULTURE WILL BE EXAMINED FOR 6 WEEKS BEFORE ISSUING A FINAL REPORT     Performed at [facility]   Report Status PENDING   Incomplete  CLOSTRIDIUM DIFFICILE BY PCR     Status: None   Collection Time    11/11/13  4:18 PM      Result Value Ref Range Status   C difficile by pcr  NEGATIVE  NEGATIVE Final  CULTURE, BLOOD (ROUTINE X 2)     Status: None   Collection Time    11/11/13  8:25 PM      Result Value Ref Range Status   Specimen Description BLOOD RIGHT ARM   Final   Special Requests BOTTLES DRAWN AEROBIC AND ANAEROBIC 5CC EACH   Final   Culture  Setup Time     Final   Value: 11/12/2013 01:05     Performed at [facility]   Culture     Final   Value: VANCOMYCIN RESISTANT ENTEROCOCCUS ISOLATED     Note: LINEZOLID TO FOLLOW CRITICAL RESULT CALLED TO, READ BACK BY AND VERIFIED WITH: BRENDA HALL 11/15/13 0815 BY SMITHERSJ     Note: Gram Stain Report Called to,Read Back By and Verified With: FRANCIS PLEASANT 11/12/13 @ 8:25PM BY RUSCOE A.     Performed at [facility]   Report Status PENDING   Incomplete   Organism ID, Bacteria VANCOMYCIN RESISTANT ENTEROCOCCUS ISOLATED   Final  CULTURE, BLOOD (ROUTINE X 2)     Status: None   Collection Time    11/11/13  8:35 PM      Result Value Ref Range Status   Specimen Description BLOOD LEFT HAND   Final   Special Requests BOTTLES DRAWN AEROBIC ONLY 1CC   Final   Culture  Setup Time     Final   Value: 11/12/2013 01:05     Performed at [facility]   Culture     Final   Value:        BLOOD CULTURE RECEIVED NO GROWTH TO DATE CULTURE WILL BE HELD FOR 5 DAYS BEFORE ISSUING A FINAL NEGATIVE REPORT     Performed at [facility]   Report Status PENDING   Incomplete  URINE CULTURE     Status: None   Collection Time    11/12/13 11:22 AM      Result Value Ref Range Status   Specimen Description URINE, CLEAN CATCH   Final   Special Requests Immunocompromised   Final   Culture  Setup Time     Final   Value: 11/12/2013 23:37     Performed at [facility]     Final   Value: NO GROWTH     Performed at [facility]   Culture     Final   Value: NO GROWTH     Performed at [facility]   Report Status 11/13/2013 FINAL   Final     Scheduled Meds: . ampicillin  (OMNIPEN) IV  2 g Intravenous 6 times per day  . atovaquone  1,500 mg Oral Daily  . azithromycin  1,200 mg Oral Weekly  . Darunavir Ethanolate  800 mg Oral Q breakfast  . diphenoxylate-atropine  2 tablet Oral TID  . emtricitabine-tenofovir  1 tablet Oral  QHS  . fluconazole (DIFLUCAN) IV  100 mg Intravenous Q24H  . furosemide  20 mg Oral Daily  . metroNIDAZOLE  500 mg Oral 3 times per day  . morphine  15 mg Oral BID  . multivitamin with minerals  1 tablet Oral Daily  . nystatin  5 mL Oral QID  . pantoprazole  40 mg Oral BID  . ritonavir  100 mg Oral Q breakfast  . spironolactone  50 mg Oral Daily  . sucralfate  1 g Oral TID WC  . vancomycin  1,000 mg Intravenous Q8H   Continuous Infusions: . dextrose 5 % and 0.9% NaCl 100 mL/hr at 11/15/13 0045     Samuel Mccuiston, DO  [facility] Pager [phone]  If 7PM-7AM, please contact night-coverage [email or web address] Password [email or web address] 11/15/2013, 8:40 AM   LOS: 9 days

## 2013-11-16 ENCOUNTER — Inpatient Hospital Stay (HOSPITAL_COMMUNITY): Payer: Medicaid Other

## 2013-11-16 LAB — BASIC METABOLIC PANEL
BUN: 7 mg/dL (ref 6–23)
CO2: 23 mEq/L (ref 19–32)
Calcium: 7.5 mg/dL — ABNORMAL LOW (ref 8.4–10.5)
Chloride: 107 mEq/L (ref 96–112)
Creatinine, Ser: 0.7 mg/dL (ref 0.50–1.35)
Glucose, Bld: 106 mg/dL — ABNORMAL HIGH (ref 70–99)
Potassium: 3.3 mEq/L — ABNORMAL LOW (ref 3.7–5.3)
SODIUM: 140 meq/L (ref 137–147)

## 2013-11-16 LAB — CBC
HCT: 23.6 % — ABNORMAL LOW (ref 39.0–52.0)
Hemoglobin: 8.2 g/dL — ABNORMAL LOW (ref 13.0–17.0)
MCH: 36.6 pg — AB (ref 26.0–34.0)
MCHC: 34.7 g/dL (ref 30.0–36.0)
MCV: 105.4 fL — AB (ref 78.0–100.0)
PLATELETS: 37 10*3/uL — AB (ref 150–400)
RBC: 2.24 MIL/uL — AB (ref 4.22–5.81)
RDW: 18.1 % — ABNORMAL HIGH (ref 11.5–15.5)
WBC: 2.1 10*3/uL — AB (ref 4.0–10.5)

## 2013-11-16 LAB — PREPARE FRESH FROZEN PLASMA
UNIT DIVISION: 0
Unit division: 0
Unit division: 0

## 2013-11-16 LAB — PROTIME-INR
INR: 2.09 — ABNORMAL HIGH (ref 0.00–1.49)
PROTHROMBIN TIME: 22.8 s — AB (ref 11.6–15.2)

## 2013-11-16 LAB — MAGNESIUM: Magnesium: 1.3 mg/dL — ABNORMAL LOW (ref 1.5–2.5)

## 2013-11-16 LAB — CK: CK TOTAL: 48 U/L (ref 7–232)

## 2013-11-16 MED ORDER — FENTANYL CITRATE 0.05 MG/ML IJ SOLN
INTRAMUSCULAR | Status: AC | PRN
  Administered 2013-11-16 (×2): 50 ug via INTRAVENOUS

## 2013-11-16 MED ORDER — POTASSIUM CHLORIDE 10 MEQ/100ML IV SOLN
10.0000 meq | INTRAVENOUS | Status: AC
  Administered 2013-11-16 (×3): 10 meq via INTRAVENOUS
  Filled 2013-11-16 (×2): qty 100

## 2013-11-16 MED ORDER — MIDAZOLAM HCL 2 MG/2ML IJ SOLN
INTRAMUSCULAR | Status: AC | PRN
  Administered 2013-11-16 (×2): 1 mg via INTRAVENOUS

## 2013-11-16 MED ORDER — MIDAZOLAM HCL 2 MG/2ML IJ SOLN
INTRAMUSCULAR | Status: AC
  Filled 2013-11-16: qty 4

## 2013-11-16 MED ORDER — MAGNESIUM SULFATE 40 MG/ML IJ SOLN
2.0000 g | Freq: Once | INTRAMUSCULAR | Status: AC
Start: 2013-11-16 — End: 2013-11-16
  Administered 2013-11-16: 2 g via INTRAVENOUS
  Filled 2013-11-16: qty 50

## 2013-11-16 MED ORDER — FENTANYL CITRATE 0.05 MG/ML IJ SOLN
INTRAMUSCULAR | Status: AC
  Filled 2013-11-16: qty 4

## 2013-11-16 MED ORDER — HYDROMORPHONE HCL PF 1 MG/ML IJ SOLN
2.0000 mg | Freq: Once | INTRAMUSCULAR | Status: AC
  Administered 2013-11-16: 2 mg via INTRAVENOUS

## 2013-11-16 MED ORDER — HYDROMORPHONE HCL PF 1 MG/ML IJ SOLN
INTRAMUSCULAR | Status: AC | PRN
  Administered 2013-11-16: 1 mg

## 2013-11-16 MED ORDER — HYDROMORPHONE HCL PF 1 MG/ML IJ SOLN
INTRAMUSCULAR | Status: AC
  Filled 2013-11-16: qty 2

## 2013-11-16 NOTE — Progress Notes (Signed)
PROGRESS NOTE  Samuel Schultz FFM:[id number] DOB: December 12, 1975 DOA: 11/06/2013 PCP: PROVIDER NOT IN SYSTEM  Assessment/Plan: Persistent colitis  -possibly related to AIDS given recurrent symptoms for over 3 weeks  - repeat stool for C.diff negative, stool culture, ova and parasites negative. Diarrhea has somewhat improved on Lomotil but still has abdominal pain.  -Flagyl discontinued on 6/5 but was restarted even worsened colitis seen on repeat CT scan..  -Patient continues to have diarrhea although frequency had reduced in last 3 days. -No nausea or vomiting but has persistent abdominal pain. Lactic acid normalized.  -IV hydration and clear liquid diet. GI consulted. No further workup recommended recent negative workups including studies and CMV culture from colon biopsy.  -Continue Lomotil for symptomatic treatment  -Colonoscopy 10/27/2013 negative  -10/16/13 stool microsporidia negative  Fungemia -11/11/13 AFB culture grew yeast -empiric echinocandin pending identification (D#2) -appreciate ID followup -I spoke with hematology, Dr. Alen Blew regarding coagulopathy--stated INR around 2.0 may be as good as it gets--recommends concurrent FFP infusion during port-a-cath removal -spoke with Dr. Pascal Lux, IR--agreeable to do procedure today -spoke with patient--HE REFUSED PROCEDURE TODAY--he understands the risks involved including but not limited to metastatic infection, sepsis, death VRE bacteremia  -Etiology do to translocation of bacteria across GI tract from colitis  -Discontinue vancomycin and ampicillin  -Start Cubicin secondary to the patient's thrombocytopenia--approved by Dr. Tommy Medal  -On 6/5 patient was hypotensive and hypoglycemic. Repeat CT scan of the abdomen showed worsening colitis. Blood culture 2/2 on 6/5 growing enterococcus. Placed on IV Rocephin. Continue vancomycin and Flagyl. Patient has a right-sided Port-A-Cath for chemotherapy placed few years back .  - TEE on 6/8 which  was negative for vegetation or thrombus. 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  -Will followup with blood culture sensitivity and determine course of abx.  -Plan to remove the Port-A-Cath however has a low platelet and high INR which needs to be corrected.  -Ordered vitamin K and will order 3 more units of FFP 11/15/13 ( has received 6 u FFP already.)  Back pain  -MRI back--neg for spondyldiskitis -start robaxin HIV/AIDS on HAART  - appreciate ID consult and recommendations  - pt is on ART . CD4 count of 20  - also on azithromycin and bactrim for PCP and MAC prophylaxis .monitor Qtc. -Continue mepron  NSVT on 6/7  low magnesium corrected. EKG within normal limits. No further episodes.  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 Bone marrow bx done at [facility] recently negative for lymphoma  Pancytopenia  - likely bone marrow suppression from HIV/AIDS and liver cirrhosis . No lymphoma seen on recent bone marrow bx done at [facility].  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 Has thrombocytopenia and coagulopathy.  Depression  Continue Lexapro.  Liver cirrhosis  Resumed Aldactone and Lasix.  AKI  Secondary to dehydration. Resolved.  Oral thrush  On IV fluconazole. Added nystatin swish and swallow    Family Communication:   Pt at beside Disposition Plan:   Home when medically stable  Cubicin D#2 Micafungin D#2 CTX D#2    Procedures/Studies: Dg Abd 1 View  11/12/2013   CLINICAL DATA:  Colitis.  EXAM: ABDOMEN - 1 VIEW  COMPARISON:  CT of the abdomen and pelvis 11/10/2013.  FINDINGS: Oral contrast material is noted throughout the colon and distal rectum. No pathologic distention of small bowel. No pneumoperitoneum.  Multiple surgical clips project over the right upper quadrant of the abdomen. Splenic contour appears enlarged.  IMPRESSION: 1. Nonobstructive bowel gas  pattern. 2. No pneumoperitoneum. 3. Splenomegaly.   Electronically Signed   By: Vinnie Langton M.D.   On: 11/12/2013 11:47   Ct Head Wo Contrast  11/10/2013   CLINICAL DATA:  Persistent abdominal pain.  Dizziness.  Headache.  EXAM: CT HEAD WITHOUT CONTRAST  TECHNIQUE: Contiguous axial images were obtained from the base of the skull through the vertex without intravenous contrast.  COMPARISON:  No priors.  FINDINGS: No acute intracranial abnormalities. Specifically, no evidence of acute intracranial hemorrhage, no definite findings of acute/subacute cerebral ischemia, no mass, mass effect, hydrocephalus or abnormal intra or extra-axial fluid collections. Visualized paranasal sinuses and mastoids are well pneumatized. No acute displaced skull fractures are identified.  IMPRESSION: *No acute intracranial abnormalities. *The appearance of the brain is normal.   Electronically Signed   By: Vinnie Langton M.D.   On: 11/10/2013 19:43   Mr Lumbar Spine W Wo Contrast  11/15/2013   CLINICAL DATA:  38 year old male with bacteremia and new back pain. Severe low back pain radiating to the right lower extremity. Initial encounter. HIV, cirrhosis, lymphoma.  EXAM: MRI LUMBAR SPINE WITHOUT AND WITH CONTRAST  TECHNIQUE: Multiplanar and multiecho pulse sequences of the lumbar spine were obtained without and with intravenous contrast.  CONTRAST:  46mL MULTIHANCE GADOBENATE DIMEGLUMINE 529 MG/ML IV SOLN  COMPARISON:  CT Abdomen and Pelvis 11/10/2013 and earlier.  FINDINGS: Normal lumbar segmentation depicted on comparison.  Diffuse loss of normal bone marrow signal in the visible spine, and left iliac bone. However, no marrow edema or destructive osseous lesion identified. No abnormal vertebral enhancement in the spine.  Visualized lower thoracic spinal cord is normal with conus medularis at L1. Normal cauda equina nerve roots. No abnormal intradural enhancement.  There is mild abnormal STIR and T2 signal in the lower right  erector spinae muscles, at the L4 and L5 level (series 5, image 1 and series 6, image 26). This is nonspecific. No definite abnormal enhancement, and other visible paraspinal muscles appear normal. There is mild subcutaneous edema in the lumbar spine, significance doubtful in this setting.  Much of the abdominal viscera are obscured on these images.  T11-T12: Grossly normal.  T12-L1:  Negative.  L1-L2:  Negative.  L2-L3:  Negative.  L3-L4: Mild disc space loss and disc desiccation. Mild circumferential disc osteophyte complex. Mild facet hypertrophy. No significant stenosis.  L4-L5:  Negative except for mild facet hypertrophy.  L5-S1: Disc space loss with disc desiccation and right eccentric circumferential disc osteophyte complex. Focal central component (series 6, image 31), but no spinal or definite lateral recess stenosis. No foraminal stenosis.  IMPRESSION: 1. Diffuse loss of normal bone marrow signal, but may be post treatment affect rather than due to widespread osseous metastatic disease. No destructive osseous lesion identified. 2. No evidence of lumbar discitis osteomyelitis. Mild to moderate chronic appearing disc degeneration at L3-L4 and L5-S1. No spinal stenosis. 3. Mild signal abnormality in the right lower lumbar erector spinae musculature, nonspecific. Early infectious myositis is not excluded in this setting.   Electronically Signed   By: Lars Pinks M.D.   On: 11/15/2013 20:30   Ct Abdomen Pelvis W Contrast  11/10/2013   CLINICAL DATA:  Persistent abdominal pain.  EXAM: CT ABDOMEN AND PELVIS WITH CONTRAST  TECHNIQUE: Multidetector CT imaging of the abdomen and pelvis was performed using the standard protocol following bolus administration  of intravenous contrast.  CONTRAST:  170mL OMNIPAQUE IOHEXOL 300 MG/ML  SOLN  COMPARISON:  CT of the abdomen and pelvis 10/22/2013.  FINDINGS: Lung Bases: Linear opacities throughout the lung bases bilaterally, predominantly in a peribronchovascular distribution,  favored to represent areas of peribronchial inflammation, potentially related to recent recurrent bouts of aspiration. Markedly thickened distal esophagus. Numerous serpiginous structures adjacent to the distal esophagus, presumably esophageal varices. Tip of a porta cath in the right atrium.  Abdomen/Pelvis: The liver has a shrunken appearance and nodular contour, compatible with advanced cirrhosis. No definite focal hepatic lesions are noted on today's limited noncontrast CT examination. Numerous surgical clips are noted adjacent to the liver. Status post cholecystectomy. Potential stone or migrated surgical clip in the proximal aspect of the common bile duct demonstrated on images 19 and 20 of series 2, unchanged. The portal vein is dilated measuring 18 mm. The spleen is massively enlarged measuring 18.6 x 14.1 x 17.7 cm (estimated splenic volume of 2,321 mL).  New compared to the recent prior examination there is marked thickening of the colonic wall involving the cecum and ascending colon. These findings are concerning for colitis. More distal aspect of the colon appears uninvolved. No significant volume of ascites. No pneumoperitoneum. No pathologic distention of small bowel. No definite lymphadenopathy identified within the abdomen or pelvis on today's non contrast CT examination. Prostate gland and urinary bladder are unremarkable in appearance. Small umbilical hernia containing only omental fat incidentally noted (unchanged).  Musculoskeletal: Multifocal sclerotic lesions are noted throughout the visualized axial and appendicular skeleton, most apparent within the sacrum at the level of S1, in the right side of the L5 vertebral body, and scattered throughout the remainder of the spine. These findings are similar to prior examinations. At T10 and T11 there is mild compression which is unchanged, with approximately 15-20% loss of height anteriorly at both vertebral body levels. Edema throughout the  subcutaneous fat of the flanks bilaterally (right greater than left).  IMPRESSION: 1. Interval development of extensive colonic wall thickening involving the cecum and ascending colon, concerning for colitis. Clinical correlation is recommended. 2. Stigmata of cirrhosis and portal hypertension, including massive splenomegaly and esophageal varices redemonstrated, as above. 3. The appearance of the lung bases suggest sequela of multiple recent bouts of aspiration. 4. Small umbilical hernia containing only omental fat incidentally noted. 5. 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 6. Additional incidental findings, as above.   Electronically Signed   By: Vinnie Langton M.D.   On: 11/10/2013 19:56   Ct Abdomen Pelvis W Contrast  11/07/2013   CLINICAL DATA:  Abdominal pain  EXAM: CT ABDOMEN AND PELVIS WITH CONTRAST  TECHNIQUE: Multidetector CT imaging of the abdomen and pelvis was performed using the standard protocol following bolus administration of intravenous contrast.  CONTRAST:  29mL OMNIPAQUE IOHEXOL 300 MG/ML  SOLN  COMPARISON:  10/22/2013  FINDINGS: Cirrhotic liver.  Portal vein is grossly patent.  Bibasilar atelectasis.  Splenomegaly.  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  Right pleural effusion and abdominal ascites have resolved. Trace amount of fluid is layering in the pelvis.  Pancreas, kidneys, adrenal glands are within normal limits.  Umbilical hernia containing adipose tissue is stable.  There are areas of wall thickening in the colon. This occurs in the ascending colon on image 53, transverse colon on image 39, and possibly within the sigmoid colon, although the sigmoid colon is decompressed.  Bladder and prostate are unremarkable.  Sclerotic changes within thoracic and lumbar vertebral bodies is not significantly changed. This may be associated with the patient's known  history of lymphoma. Lytic areas in the iliac bones are also noted.  IMPRESSION: Cirrhotic liver.  Stable splenomegaly.  There are areas of wall thickening throughout the colon. Differential diagnosis includes an inflammatory process or malignancy.  Pleural effusions and abdominal ascites have resolved. Trace fluid persist in the pelvis.   Electronically Signed   By: Maryclare Bean M.D.   On: 11/07/2013 16:11   Ct Abdomen Pelvis W Contrast  10/22/2013   CLINICAL DATA:  Lymphoma, prior cholecystectomy and appendectomy. Status post bone marrow transplant. Abdominal pain and lactic acidosis.  EXAM: CT ABDOMEN AND PELVIS WITH CONTRAST  TECHNIQUE: Multidetector CT imaging of the abdomen and pelvis was performed using the standard protocol following bolus administration of intravenous contrast.  CONTRAST:  151mL OMNIPAQUE IOHEXOL 300 MG/ML  SOLN  COMPARISON:  CT ABD - PELV W/ CM dated 10/16/2013  FINDINGS: Trace pleural effusions are increased since previously, with bibasilar dependent atelectasis.  Splenomegaly is reidentified. Nodular hepatic contour reidentified with abdominal ascites in all 4 quadrants. Interval increase in soft tissue edema compatible with third spacing. Mild irregularity of the omental fat adjacent to the anterior segment right hepatic lobe with adjacent surgical clips noted, image 14. Upper abdominal vascular collaterals likely indicate portal hypertension. Portal vein not visualized, which may indicate chronic thrombosis.  Fat containing umbilical hernia noted. Foci of gas within the anterior abdominal wall subcutaneous fat likely indicating injection locations. Improvement in colonic wall thickening is identified. Adrenal glands, spleen, and kidneys are unremarkable. Cholecystectomy clips are noted. No free air. Small retroperitoneal nodes are reidentified, largest 5 mm in the aortocaval space for example image 33. Bladder is normal. Multiple axial sclerotic osseous lesions are reidentified.   IMPRESSION: Although colonic bowel wall thickening has improved since the previous exam, there has been interval worsening of ascites, pleural effusions, and subcutaneous soft tissue edema. 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  Right upper quadrant omental nodularity. Omental disease could have this appearance although postsurgical change is also possible given the presence of clips in this area.  Axial skeletal sclerotic lesions suspicious for metastatic disease.   Electronically Signed   By: Conchita Paris M.D.   On: 10/22/2013 13:04   US Venous Img Upper Uni Right  10/20/2013   CLINICAL DATA:  Right arm swelling  EXAM: RIGHT UPPER EXTREMITY VENOUS DOPPLER ULTRASOUND  TECHNIQUE: Gray-scale sonography with graded compression, as well as color Doppler and duplex ultrasound were performed to evaluate the upper extremity deep venous system from the level of the subclavian vein and including the jugular, axillary, basilic and upper cephalic vein. Spectral Doppler was utilized to evaluate flow at rest and with distal augmentation maneuvers.  COMPARISON:  None.  FINDINGS: Thrombus within deep veins:  None visualized.  Compressibility of deep veins:  Normal.  Duplex waveform respiratory phasicity:  Normal.  Duplex waveform response to augmentation:  Normal.  Venous reflux:  None visualized.  Other findings: There is nonocclusive, incompletely compressible mural thrombus in the cephalic vein at the level of the elbow with patent persistent flow through this segment. The more central aspect of the cephalic vein is widely patent and compressible. There is subcutaneous edema in the wrist and upper arm.  IMPRESSION: 1. Negative for right upper extremity DVT.  2. Segmental superficial thrombophlebitis involving cephalic vein.   Electronically Signed   By: Arne Cleveland M.D.   On: 10/20/2013 11:45   Dg  Abd Acute W/chest  11/06/2013   CLINICAL DATA:  Fever and emesis ; lymphoma  EXAM: ACUTE ABDOMEN SERIES (ABDOMEN 2 VIEW & CHEST 1 VIEW)  COMPARISON:  Chest radiograph Oct 16, 2013; CT abdomen and pelvis Oct 22, 2013  FINDINGS: PA chest: There is no edema or consolidation. Heart size and pulmonary vascularity are normal. No adenopathy. Port-A-Cath tip is at the cavoatrial junction. No pneumothorax.  Supine and upright abdomen: There remains thickening of scattered bowel loops. There are scattered air-fluid levels. There is no well-defined obstruction or free air. There is moderate stool in the colon. There are surgical clips in the right upper quadrant.  IMPRESSION: There remains some wall thickening of several loops of bowel which given the history, may have neoplastic etiology. Scattered air-fluid levels may represent ileus or enteritis. Early obstruction is a consideration, although enteritis or ileus with tend to be more likely given the overall appearance of the bowel. No free air is seen.  There is no lung edema or consolidation.   Electronically Signed   By: Lowella Grip M.D.   On: 11/06/2013 11:22         Subjective: Patient complains of pain all over. Continues to have loose stools without hematochezia or melena. No nausea or vomiting today. Denies any fevers, chills, chest pain, shortness breath, rashes. No dysuria.  Objective: Filed Vitals:   11/15/13 1415 11/15/13 1500 11/15/13 2215 11/16/13 0543  BP: 122/62 125/65 105/53 101/51  Pulse: 89 86 88 86  Temp: 98.5 F (36.9 C) 98.4 F (36.9 C) 98.8 F (37.1 C) 99.4 F (37.4 C)  TempSrc: Oral Oral Oral Oral  Resp: 18 18 18 18   Height:      Weight:      SpO2: 98% 100% 97% 95%    Intake/Output Summary (Last 24 hours) at 11/16/13 0900 Last data filed at 11/16/13 0600  Gross per 24 hour  Intake   2828 ml  Output   3575 ml  Net   -747 ml   Weight change:  Exam:   General:  Pt is alert, follows commands appropriately, not  in acute distress  HEENT: No icterus, No thrush,[place]/AT  Cardiovascular: RRR, S1/S2, no rubs, no gallops  Respiratory: CTA bilaterally, no wheezing, no crackles, no rhonchi  Abdomen: Soft/+BS, diffuse tender without peritoneal sign, non distended, no guarding  Extremities: 2+LE edema, No lymphangitis, No petechiae, No rashes, no synovitis  Data Reviewed: Basic Metabolic Panel:  Recent Labs Lab 11/10/13 0505 11/11/13 0510 11/12/13 0415 11/15/13 0510 11/16/13 0532  NA 132* 132* 138 137 140  K 4.3 4.6 3.8 3.1* 3.3*  CL 105 104 107 104 107  CO2 19 18* 19 22 23   GLUCOSE 84 64* 81 98 106*  BUN 25* 34* 23 9 7   CREATININE 1.22 1.47* 0.90 0.73 0.70  CALCIUM 7.9* 7.9* 8.5 7.5* 7.5*  MG  --   --  1.6  --  1.3*   Liver Function Tests: No results found for this basename: AST, ALT, ALKPHOS, BILITOT, PROT, ALBUMIN,  in the last 168 hours No results found for this basename: LIPASE, AMYLASE,  in the last 168 hours No results found for this basename: AMMONIA,  in the last 168 hours CBC:  Recent Labs Lab 11/12/13 0415 11/13/13 0520 11/14/13 0350 11/15/13 0510 11/16/13 0532  WBC 4.4 2.8* 4.3 2.7*  2.1*  HGB 10.4* 8.6* 9.9* 9.0* 8.2*  HCT 29.8* 24.4* 28.8* 25.7* 23.6*  MCV 101.7* 101.2* 102.9* 104.9* 105.4*  PLT 48* 31* 40* 31* 37*   Cardiac Enzymes:  Recent Labs Lab 11/16/13 0532  CKTOTAL 48   BNP: No components found with this basename: POCBNP,  CBG:  Recent Labs Lab 11/10/13 0853 11/10/13 0917 11/10/13 0928 11/10/13 1505  GLUCAP 61* 71 73 91    Recent Results (from the past 240 hour(s))  STOOL CULTURE     Status: None   Collection Time    11/08/13  2:37 AM      Result Value Ref Range Status   Specimen Description STOOL   Final   Special Requests NONE   Final   Culture     Final   Value: NO SALMONELLA, SHIGELLA, CAMPYLOBACTER, YERSINIA, OR E.COLI 0157:H7 ISOLATED     Note: REDUCED NORMAL FLORA PRESENT     Performed at [facility]   Report Status  11/13/2013 FINAL   Final  OVA AND PARASITE EXAMINATION     Status: None   Collection Time    11/08/13  2:37 AM      Result Value Ref Range Status   Specimen Description STOOL   Final   Special Requests NONE   Final   Ova and parasites     Final   Value: NO OVA OR PARASITES SEEN     Performed at [facility]   Report Status 11/09/2013 FINAL   Final  CLOSTRIDIUM DIFFICILE BY PCR     Status: None   Collection Time    11/08/13  2:37 AM      Result Value Ref Range Status   C difficile by pcr NEGATIVE  NEGATIVE Final  CULTURE, BLOOD (ROUTINE X 2)     Status: None   Collection Time    11/10/13  4:35 PM      Result Value Ref Range Status   Specimen Description BLOOD LEFT HAND   Final   Special Requests BOTTLES DRAWN AEROBIC ONLY 3CC   Final   Culture  Setup Time     Final   Value: 11/10/2013 22:39     Performed at [facility]   Culture     Final   Value: ENTEROCOCCUS SPECIES     Note: SUSCEPTIBILITIES PERFORMED ON PREVIOUS CULTURE WITHIN THE LAST 5 DAYS.     Note: Gram Stain Report Called to,Read Back By and Verified With: JILL MOORE 11/11/13 @ 2:40PM BY RUSCOE A.     Performed at [facility]   Report Status 11/14/2013 FINAL   Final  CULTURE, BLOOD (ROUTINE X 2)     Status: None   Collection Time    11/10/13  4:50 PM      Result Value Ref Range Status   Specimen Description BLOOD LEFT ANTECUBITAL   Final   Special Requests BOTTLES DRAWN AEROBIC ONLY 10CC   Final   Culture  Setup Time     Final   Value: 11/10/2013 22:39     Performed at [facility]   Culture     Final   Value: ENTEROCOCCUS SPECIES     Note: Gram Stain Report Called to,Read Back By and Verified With: JILL MOORE 11/11/13 @ 4:03PM BY RUSCOE A.     Performed at [facility]   Report Status 11/14/2013 FINAL   Final   Organism ID, Bacteria ENTEROCOCCUS SPECIES   Final  AFB CULTURE, BLOOD  Status: None   Collection Time    11/11/13  9:35 AM      Result Value Ref Range Status     Specimen Description BLOOD LEFT HAND   Final   Special Requests 5CC   Final   Culture     Final   Value: YEAST ISOLATED;ID TO FOLLOW     Note: CRITICAL RESULT CALLED TO, READ BACK BY AND VERIFIED WITH: BRENDA RN @ 10:11AM ON 11/15/13 BY WILSN     Performed at [facility]   Report Status PENDING   Incomplete  CLOSTRIDIUM DIFFICILE BY PCR     Status: None   Collection Time    11/11/13  4:18 PM      Result Value Ref Range Status   C difficile by pcr NEGATIVE  NEGATIVE Final  CULTURE, BLOOD (ROUTINE X 2)     Status: None   Collection Time    11/11/13  8:25 PM      Result Value Ref Range Status   Specimen Description BLOOD RIGHT ARM   Final   Special Requests BOTTLES DRAWN AEROBIC AND ANAEROBIC 5CC EACH   Final   Culture  Setup Time     Final   Value: 11/12/2013 01:05     Performed at [facility]   Culture     Final   Value: VANCOMYCIN RESISTANT ENTEROCOCCUS ISOLATED     Note: LINEZOLID TO FOLLOW CRITICAL RESULT CALLED TO, READ BACK BY AND VERIFIED WITH: BRENDA HALL 11/15/13 0815 BY SMITHERSJ     Note: Gram Stain Report Called to,Read Back By and Verified With: FRANCIS PLEASANT 11/12/13 @ 8:25PM BY RUSCOE A.     Performed at [facility]   Report Status PENDING   Incomplete   Organism ID, Bacteria VANCOMYCIN RESISTANT ENTEROCOCCUS ISOLATED   Final  CULTURE, BLOOD (ROUTINE X 2)     Status: None   Collection Time    11/11/13  8:35 PM      Result Value Ref Range Status   Specimen Description BLOOD LEFT HAND   Final   Special Requests BOTTLES DRAWN AEROBIC ONLY 1CC   Final   Culture  Setup Time     Final   Value: 11/12/2013 01:05     Performed at [facility]   Culture     Final   Value:        BLOOD CULTURE RECEIVED NO GROWTH TO DATE CULTURE WILL BE HELD FOR 5 DAYS BEFORE ISSUING A FINAL NEGATIVE REPORT     Performed at [facility]   Report Status PENDING   Incomplete  URINE CULTURE     Status: None   Collection Time    11/12/13 11:22 AM       Result Value Ref Range Status   Specimen Description URINE, CLEAN CATCH   Final   Special Requests Immunocompromised   Final   Culture  Setup Time     Final   Value: 11/12/2013 23:37     Performed at [place]     Final   Value: NO GROWTH     Performed at [facility]   Culture     Final   Value: NO GROWTH     Performed at [facility]   Report Status 11/13/2013 FINAL   Final     Scheduled Meds: . atovaquone  1,500 mg Oral Daily  . azithromycin  1,200 mg Oral Weekly  . cefTRIAXone (ROCEPHIN)  IV  2 g Intravenous Q24H  . DAPTOmycin (CUBICIN)  IV  750 mg Intravenous Q24H  . Darunavir Ethanolate  800 mg Oral Q breakfast  . diphenoxylate-atropine  2 tablet Oral TID  . emtricitabine-tenofovir  1 tablet Oral QHS  . feeding supplement (RESOURCE BREEZE)  1 Container Oral TID BM  . furosemide  20 mg Oral Daily  . magnesium sulfate 1 - 4 g bolus IVPB  2 g Intravenous Once  . metroNIDAZOLE  500 mg Oral 3 times per day  . micafungin [facility]) IV  100 mg Intravenous Daily  . morphine  15 mg Oral BID  . multivitamin with minerals  1 tablet Oral Daily  . nystatin  5 mL Oral QID  . pantoprazole  40 mg Oral BID  . potassium chloride  10 mEq Intravenous Q1 Hr x 3  . ritonavir  100 mg Oral Q breakfast  . spironolactone  50 mg Oral Daily  . sucralfate  1 g Oral TID WC   Continuous Infusions: . dextrose 5 % and 0.9% NaCl 100 mL/hr at 11/16/13 0420     Ceceilia Cephus, DO  [facility] Pager [phone]  If 7PM-7AM, please contact night-coverage [email or web address] Password [email or web address] 11/16/2013, 9:00 AM   LOS: 10 days

## 2013-11-16 NOTE — Progress Notes (Signed)
PT Cancellation Note  Patient Details Name: Bakary Bramer MRN: [id number] DOB: 1976-03-27   Cancelled Treatment:    Reason Eval/Treat Not Completed: Patient declined;  Legs too painful and swollen; lethargic and just back from getting port pulled. Will see 6/12.11/16/2013  Donnella Sham, PT [phone]  (pager)   Brittian Renaldo, Tessie Fass 11/16/2013, 5:33 PM

## 2013-11-16 NOTE — Clinical Social Work Note (Signed)
Patient discussed in unit rounds with MD today- will need SNF placement at dc for IV ABX- FL2 has been faxed out and awaiting further word on offers-  Eduard Clos, MSW, [place]

## 2013-11-16 NOTE — Procedures (Signed)
Successful removal of right anterior chest wall port-a-cath. No immediate post procedural complications.  

## 2013-11-16 NOTE — Progress Notes (Signed)
I discussed with the patient again regarding the removal of Port-A-Cath. He is now verbally consented to have the procedure performed today. I spoke with interventional radiology to inform them. I spoke with the blood bank to coordinate transfusion of FFP. The patient will need a peripheral IV for the next several days until his fungemia and bacteremia are cleared.  DTat

## 2013-11-17 DIAGNOSIS — E43 Unspecified severe protein-calorie malnutrition: Secondary | ICD-10-CM

## 2013-11-17 LAB — CRYPTOCOCCAL ANTIGEN: CRYPTO AG: NEGATIVE

## 2013-11-17 LAB — PROTIME-INR
INR: 1.63 — ABNORMAL HIGH (ref 0.00–1.49)
PROTHROMBIN TIME: 18.9 s — AB (ref 11.6–15.2)

## 2013-11-17 LAB — BASIC METABOLIC PANEL
BUN: 6 mg/dL (ref 6–23)
CHLORIDE: 104 meq/L (ref 96–112)
CO2: 23 mEq/L (ref 19–32)
Calcium: 7.7 mg/dL — ABNORMAL LOW (ref 8.4–10.5)
Creatinine, Ser: 0.67 mg/dL (ref 0.50–1.35)
GFR calc Af Amer: >90 mL/min (ref >90)
GFR calc non Af Amer: >90 mL/min (ref >90)
Glucose, Bld: 118 mg/dL — ABNORMAL HIGH (ref 70–99)
POTASSIUM: 3.5 meq/L — AB (ref 3.7–5.3)
SODIUM: 138 meq/L (ref 137–147)

## 2013-11-17 LAB — PREPARE FRESH FROZEN PLASMA
UNIT DIVISION: 0
UNIT DIVISION: 0

## 2013-11-17 LAB — MAGNESIUM: MAGNESIUM: 1.7 mg/dL (ref 1.5–2.5)

## 2013-11-17 LAB — CK: Total CK: 55 U/L (ref 7–232)

## 2013-11-17 NOTE — Progress Notes (Signed)
4 Days Post-Op  Subjective: PAC removal 6/11 Pt up in bed--looks better to me today Still sluggish No complaints  Objective: Vital signs in last 24 hours: Temp:  [98.3 F (36.8 C)-99.4 F (37.4 C)] 98.6 F (37 C) (06/12 0621) Pulse Rate:  [82-99] 89 (06/12 0621) Resp:  [14-21] 21 (06/12 0621) BP: (105-126)/(48-75) 124/72 mmHg (06/12 0621) SpO2:  [92 %-99 %] 98 % (06/12 0621) Last BM Date: 11/16/13  Intake/Output from previous day: 06/11 0701 - 06/12 0700 In: 2374 [P.O.:680; I.V.:1500; Blood:194] Out: 450 [Urine:450] Intake/Output this shift:    PE:  Afeb; vss INR 1.63 today Site is clean and dry Sl tender No hematoma appreciated; minimal swelling at site   Lab Results:   Recent Labs  11/15/13 0510 11/16/13 0532  WBC 2.7* 2.1*  HGB 9.0* 8.2*  HCT 25.7* 23.6*  PLT 31* 37*   BMET  Recent Labs  11/16/13 0532 11/17/13 0540  NA 140 138  K 3.3* 3.5*  CL 107 104  CO2 23 23  GLUCOSE 106* 118*  BUN 7 6  CREATININE 0.70 0.67  CALCIUM 7.5* 7.7*   PT/INR  Recent Labs  11/16/13 0532 11/17/13 0540  LABPROT 22.8* 18.9*  INR 2.09* 1.63*   ABG No results found for this basename: PHART, PCO2, PO2, HCO3,  in the last 72 hours  Studies/Results: Mr Lumbar Spine W Wo Contrast  11/15/2013   CLINICAL DATA:  38 year old male with bacteremia and new back pain. Severe low back pain radiating to the right lower extremity. Initial encounter. HIV, cirrhosis, lymphoma.  EXAM: MRI LUMBAR SPINE WITHOUT AND WITH CONTRAST  TECHNIQUE: Multiplanar and multiecho pulse sequences of the lumbar spine were obtained without and with intravenous contrast.  CONTRAST:  21mL MULTIHANCE GADOBENATE DIMEGLUMINE 529 MG/ML IV SOLN  COMPARISON:  CT Abdomen and Pelvis 11/10/2013 and earlier.  FINDINGS: Normal lumbar segmentation depicted on comparison.  Diffuse loss of normal bone marrow signal in the visible spine, and left iliac bone. However, no marrow edema or destructive osseous lesion  identified. No abnormal vertebral enhancement in the spine.  Visualized lower thoracic spinal cord is normal with conus medularis at L1. Normal cauda equina nerve roots. No abnormal intradural enhancement.  There is mild abnormal STIR and T2 signal in the lower right erector spinae muscles, at the L4 and L5 level (series 5, image 1 and series 6, image 26). This is nonspecific. No definite abnormal enhancement, and other visible paraspinal muscles appear normal. There is mild subcutaneous edema in the lumbar spine, significance doubtful in this setting.  Much of the abdominal viscera are obscured on these images.  T11-T12: Grossly normal.  T12-L1:  Negative.  L1-L2:  Negative.  L2-L3:  Negative.  L3-L4: Mild disc space loss and disc desiccation. Mild circumferential disc osteophyte complex. Mild facet hypertrophy. No significant stenosis.  L4-L5:  Negative except for mild facet hypertrophy.  L5-S1: Disc space loss with disc desiccation and right eccentric circumferential disc osteophyte complex. Focal central component (series 6, image 31), but no spinal or definite lateral recess stenosis. No foraminal stenosis.  IMPRESSION: 1. Diffuse loss of normal bone marrow signal, but may be post treatment affect rather than due to widespread osseous metastatic disease. No destructive osseous lesion identified. 2. No evidence of lumbar discitis osteomyelitis. Mild to moderate chronic appearing disc degeneration at L3-L4 and L5-S1. No spinal stenosis. 3. Mild signal abnormality in the right lower lumbar erector spinae musculature, nonspecific. Early infectious myositis is not excluded in this setting.  Electronically Signed   By: Lars Pinks M.D.   On: 11/15/2013 20:30   Ir Removal [employer] Access W/ Port W/o Fl Mod Sed  11/16/2013   CLINICAL DATA:  History of bacteremia, concern for seeding of patient's chronic Port a Catheter.  EXAM: REMOVAL OF IMPLANTED TUNNELED PORT-A-CATH  MEDICATIONS: The patient is currently admitted to the  hospital and receiving intravenous antibiotics; The antibiotic was administered within 1 hour prior to the start of the procedure.  ANESTHESIA/SEDATION: Intravenous Versed, Fentanyl and Dilaudid was administered for conscious sedation  Sedation time: 20  FLUOROSCOPY TIME:  None  PROCEDURE: Informed written consent was obtained from the patient after a discussion of the risk, benefits and alternatives to the procedure. The patient was positioned supine on the fluoroscopy table and the right chest Port-A-Cath site was prepped with chlorhexidine. A sterile gown and gloves were worn during the procedure. Local anesthesia was provided with 1% lidocaine with epinephrine. A timeout was performed prior to the initiation of the procedure.  An incision was made overlying the Port-A-Cath with a #15 scalpel. Utilizing sharp and blunt dissection, the Port-A-Cath was removed completely. The pocked was irrigated with sterile saline. Wound closure was performed with subcutaneous 3-0 Monocryl, subcuticular 4-0 Vicryl and Dermabond. A dressing was placed. The patient tolerated the procedure well without immediate post procedural complication.  FINDINGS: Successful removal of implant Port-A-Cath without immediate post procedural complication.  Visual inspection of the Port a catheter pocket was negative for any definitive evidence of infection and as such, the incision was closed primarily with absorbable suture as above.  IMPRESSION: Successful removal of implanted Port-A-Cath.   Electronically Signed   By: Sandi Mariscal M.D.   On: 11/16/2013 16:57    Anti-infectives: Anti-infectives   Start     Dose/Rate Route Frequency Ordered Stop   11/16/13 1000  DAPTOmycin (CUBICIN) 750 mg in sodium chloride 0.9 % IVPB     750 mg 230 mL/hr over 30 Minutes Intravenous Every 24 hours 11/15/13 0854     11/15/13 1800  cefTRIAXone (ROCEPHIN) 2 g in dextrose 5 % 50 mL IVPB     2 g 100 mL/hr over 30 Minutes Intravenous Every 24 hours 11/15/13  1746     11/15/13 1200  micafungin (MYCAMINE) 100 mg in sodium chloride 0.9 % 100 mL IVPB     100 mg 100 mL/hr over 1 Hours Intravenous Daily 11/15/13 1110     11/15/13 0845  DAPTOmycin (CUBICIN) 554.5 mg in sodium chloride 0.9 % IVPB  Status:  Discontinued     6 mg/kg  92.4 kg 222.2 mL/hr over 30 Minutes Intravenous Every 24 hours 11/15/13 0844 11/15/13 0854   11/14/13 2000  vancomycin (VANCOCIN) IVPB 1000 mg/200 mL premix  Status:  Discontinued     1,000 mg 200 mL/hr over 60 Minutes Intravenous Every 8 hours 11/14/13 1105 11/15/13 0844   11/14/13 1200  fluconazole (DIFLUCAN) IVPB 100 mg  Status:  Discontinued     100 mg 50 mL/hr over 60 Minutes Intravenous Every 24 hours 11/14/13 1101 11/15/13 1110   11/13/13 1615  ampicillin (OMNIPEN) 2 g in sodium chloride 0.9 % 50 mL IVPB  Status:  Discontinued     2 g 150 mL/hr over 20 Minutes Intravenous 6 times per day 11/13/13 1611 11/15/13 0844   11/11/13 1830  cefTRIAXone (ROCEPHIN) 2 g in dextrose 5 % 50 mL IVPB  Status:  Discontinued     2 g 100 mL/hr over 30 Minutes Intravenous Every 24  hours 11/11/13 1744 11/13/13 1611   11/11/13 1745  metroNIDAZOLE (FLAGYL) tablet 500 mg     500 mg Oral 3 times per day 11/11/13 1744     11/11/13 1600  vancomycin (VANCOCIN) IVPB 1000 mg/200 mL premix  Status:  Discontinued     1,000 mg 200 mL/hr over 60 Minutes Intravenous Every 12 hours 11/11/13 1555 11/14/13 1105   11/11/13 1000  fluconazole (DIFLUCAN) IVPB 100 mg  Status:  Discontinued     100 mg 50 mL/hr over 60 Minutes Intravenous Every 24 hours 11/11/13 0907 11/14/13 1101   11/11/13 0815  metroNIDAZOLE (FLAGYL) IVPB 500 mg  Status:  Discontinued     500 mg 100 mL/hr over 60 Minutes Intravenous Every 8 hours 11/11/13 0801 11/11/13 1144   11/10/13 2145  piperacillin-tazobactam (ZOSYN) IVPB 3.375 g  Status:  Discontinued     3.375 g 12.5 mL/hr over 240 Minutes Intravenous Every 8 hours 11/10/13 2133 11/11/13 1744   11/10/13 1000  azithromycin  (ZITHROMAX) tablet 1,200 mg     1,200 mg Oral Weekly 11/07/13 1141     11/10/13 0800  Darunavir Ethanolate (PREZISTA) tablet 800 mg     800 mg Oral Daily with breakfast 11/09/13 1320     11/07/13 1150  sulfamethoxazole-trimethoprim (BACTRIM DS) 800-160 MG per tablet 1 tablet  Status:  Discontinued     1 tablet Oral Daily 11/07/13 1150 11/08/13 1729   11/07/13 0800  ritonavir (NORVIR) tablet 100 mg     100 mg Oral Daily with breakfast 11/06/13 1701     11/06/13 2200  emtricitabine-tenofovir (TRUVADA) 200-300 MG per tablet 1 tablet     1 tablet Oral Daily at bedtime 11/06/13 1701     11/06/13 1715  atovaquone (MEPRON) 750 MG/5ML suspension 1,500 mg     1,500 mg Oral Daily 11/06/13 1701     11/06/13 1715  Darunavir Ethanolate (PREZISTA) tablet 800 mg  Status:  Discontinued     800 mg Oral Daily 11/06/13 1701 11/09/13 1320   11/06/13 1715  metroNIDAZOLE (FLAGYL) IVPB 500 mg  Status:  Discontinued     500 mg 100 mL/hr over 60 Minutes Intravenous Every 8 hours 11/06/13 1710 11/10/13 1511      Assessment/Plan: s/p PAC removal 11/16/13 Doing well afeb Minimal swelling but no hematoma noted   LOS: 11 days    Bobette Leyh A 11/17/2013

## 2013-11-17 NOTE — Progress Notes (Signed)
[facility]  38yo M with advanced HIV disease, CD 4 count of 20/VL 178 on truvada/DRVr, presents with diarrhea due to severe colitis who has enterococcal bacteremia and fungemia  Dapto#2 micafungin #2   Subjective: remains afebrile. Complains of lower extremity edema, he feels that he has pain originating from foot radiating up to thigh. He also has noticed a large bruise on right calf that he is unclear when that happened. No significant pain to right chest wall from portacath removal on 6/11. He has had 2 watery stools this morning. Still mild abdominal discomfort    Antibiotics:  Anti-infectives   Start     Dose/Rate Route Frequency Ordered Stop   11/16/13 1000  DAPTOmycin (CUBICIN) 750 mg in sodium chloride 0.9 % IVPB     750 mg 230 mL/hr over 30 Minutes Intravenous Every 24 hours 11/15/13 0854     11/15/13 1800  cefTRIAXone (ROCEPHIN) 2 g in dextrose 5 % 50 mL IVPB     2 g 100 mL/hr over 30 Minutes Intravenous Every 24 hours 11/15/13 1746     11/15/13 1200  micafungin (MYCAMINE) 100 mg in sodium chloride 0.9 % 100 mL IVPB     100 mg 100 mL/hr over 1 Hours Intravenous Daily 11/15/13 1110     11/15/13 0845  DAPTOmycin (CUBICIN) 554.5 mg in sodium chloride 0.9 % IVPB  Status:  Discontinued     6 mg/kg  92.4 kg 222.2 mL/hr over 30 Minutes Intravenous Every 24 hours 11/15/13 0844 11/15/13 0854   11/14/13 2000  vancomycin (VANCOCIN) IVPB 1000 mg/200 mL premix  Status:  Discontinued     1,000 mg 200 mL/hr over 60 Minutes Intravenous Every 8 hours 11/14/13 1105 11/15/13 0844   11/14/13 1200  fluconazole (DIFLUCAN) IVPB 100 mg  Status:  Discontinued     100 mg 50 mL/hr over 60 Minutes Intravenous Every 24 hours 11/14/13 1101 11/15/13 1110   11/13/13 1615  ampicillin (OMNIPEN) 2 g in sodium chloride 0.9 % 50 mL IVPB  Status:  Discontinued     2 g 150 mL/hr over 20 Minutes Intravenous 6 times per day 11/13/13 1611 11/15/13 0844   11/11/13 1830   cefTRIAXone (ROCEPHIN) 2 g in dextrose 5 % 50 mL IVPB  Status:  Discontinued     2 g 100 mL/hr over 30 Minutes Intravenous Every 24 hours 11/11/13 1744 11/13/13 1611   11/11/13 1745  metroNIDAZOLE (FLAGYL) tablet 500 mg     500 mg Oral 3 times per day 11/11/13 1744     11/11/13 1600  vancomycin (VANCOCIN) IVPB 1000 mg/200 mL premix  Status:  Discontinued     1,000 mg 200 mL/hr over 60 Minutes Intravenous Every 12 hours 11/11/13 1555 11/14/13 1105   11/11/13 1000  fluconazole (DIFLUCAN) IVPB 100 mg  Status:  Discontinued     100 mg 50 mL/hr over 60 Minutes Intravenous Every 24 hours 11/11/13 0907 11/14/13 1101   11/11/13 0815  metroNIDAZOLE (FLAGYL) IVPB 500 mg  Status:  Discontinued     500 mg 100 mL/hr over 60 Minutes Intravenous Every 8 hours 11/11/13 0801 11/11/13 1144   11/10/13 2145  piperacillin-tazobactam (ZOSYN) IVPB 3.375 g  Status:  Discontinued     3.375 g 12.5 mL/hr over 240 Minutes Intravenous Every 8 hours 11/10/13 2133 11/11/13 1744   11/10/13 1000  azithromycin (ZITHROMAX) tablet 1,200 mg     1,200 mg Oral Weekly 11/07/13 1141     11/10/13 0800  Darunavir Ethanolate (PREZISTA) tablet 800 mg     800 mg Oral Daily with breakfast 11/09/13 1320     11/07/13 1150  sulfamethoxazole-trimethoprim (BACTRIM DS) 800-160 MG per tablet 1 tablet  Status:  Discontinued     1 tablet Oral Daily 11/07/13 1150 11/08/13 1729   11/07/13 0800  ritonavir (NORVIR) tablet 100 mg     100 mg Oral Daily with breakfast 11/06/13 1701     11/06/13 2200  emtricitabine-tenofovir (TRUVADA) 200-300 MG per tablet 1 tablet     1 tablet Oral Daily at bedtime 11/06/13 1701     11/06/13 1715  atovaquone (MEPRON) 750 MG/5ML suspension 1,500 mg     1,500 mg Oral Daily 11/06/13 1701     11/06/13 1715  Darunavir Ethanolate (PREZISTA) tablet 800 mg  Status:  Discontinued     800 mg Oral Daily 11/06/13 1701 11/09/13 1320   11/06/13 1715  metroNIDAZOLE (FLAGYL) IVPB 500 mg  Status:  Discontinued     500 mg 100  mL/hr over 60 Minutes Intravenous Every 8 hours 11/06/13 1710 11/10/13 1511      Medications: Scheduled Meds: . atovaquone  1,500 mg Oral Daily  . azithromycin  1,200 mg Oral Weekly  . cefTRIAXone (ROCEPHIN)  IV  2 g Intravenous Q24H  . DAPTOmycin (CUBICIN)  IV  750 mg Intravenous Q24H  . Darunavir Ethanolate  800 mg Oral Q breakfast  . diphenoxylate-atropine  2 tablet Oral TID  . emtricitabine-tenofovir  1 tablet Oral QHS  . feeding supplement (RESOURCE BREEZE)  1 Container Oral TID BM  . furosemide  20 mg Oral Daily  . metroNIDAZOLE  500 mg Oral 3 times per day  . micafungin [facility]) IV  100 mg Intravenous Daily  . morphine  15 mg Oral BID  . multivitamin with minerals  1 tablet Oral Daily  . nystatin  5 mL Oral QID  . pantoprazole  40 mg Oral BID  . ritonavir  100 mg Oral Q breakfast  . spironolactone  50 mg Oral Daily  . sucralfate  1 g Oral TID WC     Objective: Weight change:   Intake/Output Summary (Last 24 hours) at 11/17/13 1157 Last data filed at 11/17/13 0949  Gross per 24 hour  Intake   2374 ml  Output    450 ml  Net   1924 ml   Blood pressure 124/72, pulse 89, temperature 98.6 F (37 C), temperature source Oral, resp. rate 21, height 5\' 9"  (1.753 m), weight 203 lb 9.6 oz (92.352 kg), SpO2 98.00%. Temp:  [98.3 F (36.8 C)-99.4 F (37.4 C)] 98.6 F (37 C) (06/12 0621) Pulse Rate:  [82-99] 89 (06/12 0621) Resp:  [14-21] 21 (06/12 0621) BP: (105-126)/(48-75) 124/72 mmHg (06/12 0621) SpO2:  [92 %-99 %] 98 % (06/12 0621) General: Alert and awake, most alert that i have seen him during this hospitalization HEENT: EOMI  [facility] regular rate, normal r, no murmur rubs or gallops  Chest: clear to auscultation bilaterally, no wheezing, rales or rhonchi  Abdomen: decreased bowel sounds. No significant tenderness with mild palpation in all quadrants except lower left  Neuro: nonfocal Ext: +2-3 pitting edema lower extremities Skin = large echymosis to lateral right  calf   CBC:  Recent Labs Lab 11/12/13 0415 11/13/13 0520 11/14/13 0350 11/14/13 0900 11/15/13 0510 11/15/13 1000 11/16/13 0532 11/17/13 0540  HGB 10.4* 8.6* 9.9*  --  9.0*  --  8.2*  --   HCT 29.8* 24.4* 28.8*  --  25.7*  --  23.6*  --   PLT 48* 31* 40*  --  31*  --  37*  --   INR 2.23* 2.02*  --  2.03*  --  2.04* 2.09* 1.63*     BMET  Recent Labs  11/16/13 0532 11/17/13 0540  NA 140 138  K 3.3* 3.5*  CL 107 104  CO2 23 23  GLUCOSE 106* 118*  BUN 7 6  CREATININE 0.70 0.67  CALCIUM 7.5* 7.7*      Micro Results: Recent Results (from the past 240 hour(s))  STOOL CULTURE     Status: None   Collection Time    11/08/13  2:37 AM      Result Value Ref Range Status   Specimen Description STOOL   Final   Special Requests NONE   Final   Culture     Final   Value: NO SALMONELLA, SHIGELLA, CAMPYLOBACTER, YERSINIA, OR E.COLI 0157:H7 ISOLATED     Note: REDUCED NORMAL FLORA PRESENT     Performed at [facility]   Report Status 11/13/2013 FINAL   Final  OVA AND PARASITE EXAMINATION     Status: None   Collection Time    11/08/13  2:37 AM      Result Value Ref Range Status   Specimen Description STOOL   Final   Special Requests NONE   Final   Ova and parasites     Final   Value: NO OVA OR PARASITES SEEN     Performed at [facility]   Report Status 11/09/2013 FINAL   Final  CLOSTRIDIUM DIFFICILE BY PCR     Status: None   Collection Time    11/08/13  2:37 AM      Result Value Ref Range Status   C difficile by pcr NEGATIVE  NEGATIVE Final  CULTURE, BLOOD (ROUTINE X 2)     Status: None   Collection Time    11/10/13  4:35 PM      Result Value Ref Range Status   Specimen Description BLOOD LEFT HAND   Final   Special Requests BOTTLES DRAWN AEROBIC ONLY 3CC   Final   Culture  Setup Time     Final   Value: 11/10/2013 22:39     Performed at [facility]   Culture     Final   Value: ENTEROCOCCUS SPECIES     Note: SUSCEPTIBILITIES PERFORMED ON  PREVIOUS CULTURE WITHIN THE LAST 5 DAYS.     Note: Gram Stain Report Called to,Read Back By and Verified With: JILL MOORE 11/11/13 @ 2:40PM BY RUSCOE A.     Performed at [facility]   Report Status 11/14/2013 FINAL   Final  CULTURE, BLOOD (ROUTINE X 2)     Status: None   Collection Time    11/10/13  4:50 PM      Result Value Ref Range Status   Specimen Description BLOOD LEFT ANTECUBITAL   Final   Special Requests BOTTLES DRAWN AEROBIC ONLY 10CC   Final   Culture  Setup Time     Final   Value: 11/10/2013 22:39     Performed at [facility]   Culture     Final   Value: ENTEROCOCCUS SPECIES     Note: Gram Stain Report Called to,Read Back By and Verified With: JILL MOORE 11/11/13 @ 4:03PM BY RUSCOE A.     Performed at [facility]   Report Status 11/14/2013 FINAL   Final  Organism ID, Bacteria ENTEROCOCCUS SPECIES   Final  AFB CULTURE, BLOOD     Status: None   Collection Time    11/11/13  9:35 AM      Result Value Ref Range Status   Specimen Description BLOOD LEFT HAND   Final   Special Requests 5CC   Final   Culture     Final   Value: YEAST ISOLATED;ID TO FOLLOW     Note: CRITICAL RESULT CALLED TO, READ BACK BY AND VERIFIED WITH: BRENDA RN @ 10:11AM ON 11/15/13 BY WILSN     Performed at [facility]   Report Status PENDING   Incomplete  CLOSTRIDIUM DIFFICILE BY PCR     Status: None   Collection Time    11/11/13  4:18 PM      Result Value Ref Range Status   C difficile by pcr NEGATIVE  NEGATIVE Final  CULTURE, BLOOD (ROUTINE X 2)     Status: None   Collection Time    11/11/13  8:25 PM      Result Value Ref Range Status   Specimen Description BLOOD RIGHT ARM   Final   Special Requests BOTTLES DRAWN AEROBIC AND ANAEROBIC 5CC EACH   Final   Culture  Setup Time     Final   Value: 11/12/2013 01:05     Performed at [facility]   Culture     Final   Value: VANCOMYCIN RESISTANT ENTEROCOCCUS ISOLATED     Note: LINEZOLID TO FOLLOW CRITICAL RESULT  CALLED TO, READ BACK BY AND VERIFIED WITH: BRENDA HALL 11/15/13 0815 BY SMITHERSJ DAPTOMYCIN=3 UG/ML=SENSITIVE     Note: Gram Stain Report Called to,Read Back By and Verified With: FRANCIS PLEASANT 11/12/13 @ 8:25PM BY RUSCOE A.     Performed at [facility]   Report Status PENDING   Incomplete   Organism ID, Bacteria VANCOMYCIN RESISTANT ENTEROCOCCUS ISOLATED   Final  CULTURE, BLOOD (ROUTINE X 2)     Status: None   Collection Time    11/11/13  8:35 PM      Result Value Ref Range Status   Specimen Description BLOOD LEFT HAND   Final   Special Requests BOTTLES DRAWN AEROBIC ONLY 1CC   Final   Culture  Setup Time     Final   Value: 11/12/2013 01:05     Performed at [facility]   Culture     Final   Value:        BLOOD CULTURE RECEIVED NO GROWTH TO DATE CULTURE WILL BE HELD FOR 5 DAYS BEFORE ISSUING A FINAL NEGATIVE REPORT     Performed at [facility]   Report Status PENDING   Incomplete  URINE CULTURE     Status: None   Collection Time    11/12/13 11:22 AM      Result Value Ref Range Status   Specimen Description URINE, CLEAN CATCH   Final   Special Requests Immunocompromised   Final   Culture  Setup Time     Final   Value: 11/12/2013 23:37     Performed at [place]     Final   Value: NO GROWTH     Performed at [facility]   Culture     Final   Value: NO GROWTH     Performed at [facility]   Report Status 11/13/2013 FINAL   Final    Studies/Results: Mr Lumbar Spine W Wo  Contrast  11/15/2013   CLINICAL DATA:  38 year old male with bacteremia and new back pain. Severe low back pain radiating to the right lower extremity. Initial encounter. HIV, cirrhosis, lymphoma.  EXAM: MRI LUMBAR SPINE WITHOUT AND WITH CONTRAST  TECHNIQUE: Multiplanar and multiecho pulse sequences of the lumbar spine were obtained without and with intravenous contrast.  CONTRAST:  2mL MULTIHANCE GADOBENATE DIMEGLUMINE 529 MG/ML IV SOLN   COMPARISON:  CT Abdomen and Pelvis 11/10/2013 and earlier.  FINDINGS: Normal lumbar segmentation depicted on comparison.  Diffuse loss of normal bone marrow signal in the visible spine, and left iliac bone. However, no marrow edema or destructive osseous lesion identified. No abnormal vertebral enhancement in the spine.  Visualized lower thoracic spinal cord is normal with conus medularis at L1. Normal cauda equina nerve roots. No abnormal intradural enhancement.  There is mild abnormal STIR and T2 signal in the lower right erector spinae muscles, at the L4 and L5 level (series 5, image 1 and series 6, image 26). This is nonspecific. No definite abnormal enhancement, and other visible paraspinal muscles appear normal. There is mild subcutaneous edema in the lumbar spine, significance doubtful in this setting.  Much of the abdominal viscera are obscured on these images.  T11-T12: Grossly normal.  T12-L1:  Negative.  L1-L2:  Negative.  L2-L3:  Negative.  L3-L4: Mild disc space loss and disc desiccation. Mild circumferential disc osteophyte complex. Mild facet hypertrophy. No significant stenosis.  L4-L5:  Negative except for mild facet hypertrophy.  L5-S1: Disc space loss with disc desiccation and right eccentric circumferential disc osteophyte complex. Focal central component (series 6, image 31), but no spinal or definite lateral recess stenosis. No foraminal stenosis.  IMPRESSION: 1. Diffuse loss of normal bone marrow signal, but may be post treatment affect rather than due to widespread osseous metastatic disease. No destructive osseous lesion identified. 2. No evidence of lumbar discitis osteomyelitis. Mild to moderate chronic appearing disc degeneration at L3-L4 and L5-S1. No spinal stenosis. 3. Mild signal abnormality in the right lower lumbar erector spinae musculature, nonspecific. Early infectious myositis is not excluded in this setting.   Electronically Signed   By: Lars Pinks M.D.   On: 11/15/2013 20:30    Ir Removal [employer] Access W/ Port W/o Fl Mod Sed  11/16/2013   CLINICAL DATA:  History of bacteremia, concern for seeding of patient's chronic Port a Catheter.  EXAM: REMOVAL OF IMPLANTED TUNNELED PORT-A-CATH  MEDICATIONS: The patient is currently admitted to the hospital and receiving intravenous antibiotics; The antibiotic was administered within 1 hour prior to the start of the procedure.  ANESTHESIA/SEDATION: Intravenous Versed, Fentanyl and Dilaudid was administered for conscious sedation  Sedation time: 20  FLUOROSCOPY TIME:  None  PROCEDURE: Informed written consent was obtained from the patient after a discussion of the risk, benefits and alternatives to the procedure. The patient was positioned supine on the fluoroscopy table and the right chest Port-A-Cath site was prepped with chlorhexidine. A sterile gown and gloves were worn during the procedure. Local anesthesia was provided with 1% lidocaine with epinephrine. A timeout was performed prior to the initiation of the procedure.  An incision was made overlying the Port-A-Cath with a #15 scalpel. Utilizing sharp and blunt dissection, the Port-A-Cath was removed completely. The pocked was irrigated with sterile saline. Wound closure was performed with subcutaneous 3-0 Monocryl, subcuticular 4-0 Vicryl and Dermabond. A dressing was placed. The patient tolerated the procedure well without immediate post procedural complication.  FINDINGS: Successful removal of implant  Port-A-Cath without immediate post procedural complication.  Visual inspection of the Port a catheter pocket was negative for any definitive evidence of infection and as such, the incision was closed primarily with absorbable suture as above.  IMPRESSION: Successful removal of implanted Port-A-Cath.   Electronically Signed   By: Sandi Mariscal M.D.   On: 11/16/2013 16:57   TEE: 6/8 no vegetations   Assessment/Plan:  Principal Problem:   Nausea and vomiting in adult Active Problems:    AIDS   Large cell lymphoma   Cirrhosis, non-alcoholic   Pancytopenia   Enteritis   Other pancytopenia   Acute kidney injury   Hypotension, unspecified   Sepsis   Hypoglycemia   Other and unspecified noninfectious gastroenteritis and colitis(558.9)   Gram-positive cocci bacteremia   NSVT (nonsustained ventricular tachycardia)   Protein-calorie malnutrition, severe   VRE bacteremia    Samuel Schultz is a 38 y.o. male with  HIV/AIDS, hx of large cell lymphoma, advanced HIV disease, CD 4 count of 20/VL 178 on truvada/DRVr, presents with diarrhea due to severe colitis who has enterococcal bacteremia and fungemia. He recently had extensive workup at [facility] including colonoscopy  admitted to [facility] with development of extensive colonic wall thickening involving the cecum and ascending colon.  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 Since  TEE negative, and port removal, presumably has a nidus remove. Daptomycin at 8mg /kg/day to cover both VRE as well as amp S enterococcus with daptomycin 8mg /kg/day -- will  repeat blood cx today to document clearance before new line can be placed  #2  fungemia = recommend to treat with micafungin x 14 days. Using day 1 as date of clearance of blood cx. Yeast has yet to be identified. Will check cryptococcal antigen serum today.   #3 leg pain = concern that it maybe due to elevated CK. Will add that to labs today  #4 lower extremity edema = may benefit from diuresis  #5 Diffuse colitis: he recently underwent colonoscopy with biopsy on 5/22 at [facility] that did not show abnormality, thus colonic inflammation is thought to be due to hiv, as a diagnosis of exclusion, diarrhea will continue to give lomotil to help with perfuse diarrhea. Have discontinued ctx and metronidazole today  #6  HIV = continue on his current regimen plus oi proph      LOS: 11 days   Demita Tobia 11/17/2013, 11:57 AM

## 2013-11-17 NOTE — Progress Notes (Signed)
PROGRESS NOTE  Samuel Schultz IRS:[id number] DOB: 28-Feb-1976 DOA: 11/06/2013 PCP: PROVIDER NOT IN SYSTEM  Assessment/Plan: Persistent colitis  -possibly related to AIDS given recurrent symptoms for over 3 weeks  - repeat stool for C.diff negative, stool culture, ova and parasites negative. Diarrhea has somewhat improved on Lomotil but still has abdominal pain.  -Flagyl discontinued on 6/5 but was restarted even worsened colitis seen on repeat CT scan.  Flagy discontinued on 06/12/2015l -Patient continues to have diarrhea although frequency had reduced in last 4 days. -No nausea or vomiting but has persistent abdominal pain. Lactic acid normalized.  -IV hydration and clear liquid diet. GI consulted. No further workup recommended recent negative workups including studies and CMV culture from colon biopsy.  -Continue Lomotil for symptomatic treatment  -Colonoscopy 10/27/2013 negative  -10/16/13 stool microsporidia negative  Fungemia  -11/11/13 AFB culture grew yeast  -empiric echinocandin pending identification (D#3)  -appreciate ID followup  -I spoke with hematology, Dr. Alen Blew regarding coagulopathy--stated INR around 2.0 may be as good as it gets--recommends concurrent FFP infusion during port-a-cath removal  -11/16/2013--Port-A-Cath removed -11/17/2013--repeat blood cultures -spoke with Dr. Pascal Lux, IR--agreeable to do procedure today  -spoke with patient--HE REFUSED PROCEDURE TODAY--he understands the risks involved including but not limited to metastatic infection, sepsis, death  VRE bacteremia  -Etiology do to translocation of bacteria across GI tract from colitis  -Discontinue vancomycin and ampicillin  -Start Cubicin secondary to the patient's thrombocytopenia -On 6/5 patient was hypotensive and hypoglycemic. Repeat CT scan of the abdomen showed worsening colitis.  Continue vancomycin and Flagyl. Patient has a right-sided Port-A-Cath for chemotherapy placed few years back .  -  TEE on 6/8 which was negative for vegetation or thrombus. 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  -pt received 8 units FFP total for port-a-cath removal  Back pain  -MRI back--neg for spondyldiskitis  -start flexeril  HIV/AIDS on HAART  - appreciate ID consult and recommendations  - pt is on ART . CD4 count of 20  - also on azithromycin and bactrim for PCP and MAC prophylaxis .monitor Qtc. -Continue mepron  Leg pain and edema -venous duplex -ck 48 -likely due to hypoalbuminemia -Previous UA is negative for proteinuria NSVT on 6/7  low magnesium corrected. EKG within normal limits. No further episodes.  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 Bone marrow bx done at [facility] recently negative for lymphoma  Pancytopenia  - likely bone marrow suppression from HIV/AIDS and liver cirrhosis . No lymphoma seen on recent bone marrow bx done at [facility].  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 Has thrombocytopenia and coagulopathy.  Depression  Continue Lexapro.  Liver cirrhosis  Resumed Aldactone and Lasix.  AKI  Secondary to dehydration. Resolved.  Oral thrush  -On micafungin Family Communication: Pt at beside  Disposition Plan: SNF when medically stable          Procedures/Studies: Dg Abd 1 View  11/12/2013   CLINICAL DATA:  Colitis.  EXAM: ABDOMEN - 1 VIEW  COMPARISON:  CT of the abdomen and pelvis 11/10/2013.  FINDINGS: Oral contrast material is noted throughout the colon and distal rectum. No pathologic distention of small bowel. No pneumoperitoneum. Multiple surgical clips project over the right upper quadrant of the abdomen. Splenic contour appears enlarged.  IMPRESSION: 1. Nonobstructive bowel gas pattern. 2. No pneumoperitoneum. 3. Splenomegaly.   Electronically Signed  By: Vinnie Langton M.D.   On: 11/12/2013 11:47   Ct Head Wo Contrast  11/10/2013    CLINICAL DATA:  Persistent abdominal pain.  Dizziness.  Headache.  EXAM: CT HEAD WITHOUT CONTRAST  TECHNIQUE: Contiguous axial images were obtained from the base of the skull through the vertex without intravenous contrast.  COMPARISON:  No priors.  FINDINGS: No acute intracranial abnormalities. Specifically, no evidence of acute intracranial hemorrhage, no definite findings of acute/subacute cerebral ischemia, no mass, mass effect, hydrocephalus or abnormal intra or extra-axial fluid collections. Visualized paranasal sinuses and mastoids are well pneumatized. No acute displaced skull fractures are identified.  IMPRESSION: *No acute intracranial abnormalities. *The appearance of the brain is normal.   Electronically Signed   By: Vinnie Langton M.D.   On: 11/10/2013 19:43   Mr Lumbar Spine W Wo Contrast  11/15/2013   CLINICAL DATA:  38 year old male with bacteremia and new back pain. Severe low back pain radiating to the right lower extremity. Initial encounter. HIV, cirrhosis, lymphoma.  EXAM: MRI LUMBAR SPINE WITHOUT AND WITH CONTRAST  TECHNIQUE: Multiplanar and multiecho pulse sequences of the lumbar spine were obtained without and with intravenous contrast.  CONTRAST:  49mL MULTIHANCE GADOBENATE DIMEGLUMINE 529 MG/ML IV SOLN  COMPARISON:  CT Abdomen and Pelvis 11/10/2013 and earlier.  FINDINGS: Normal lumbar segmentation depicted on comparison.  Diffuse loss of normal bone marrow signal in the visible spine, and left iliac bone. However, no marrow edema or destructive osseous lesion identified. No abnormal vertebral enhancement in the spine.  Visualized lower thoracic spinal cord is normal with conus medularis at L1. Normal cauda equina nerve roots. No abnormal intradural enhancement.  There is mild abnormal STIR and T2 signal in the lower right erector spinae muscles, at the L4 and L5 level (series 5, image 1 and series 6, image 26). This is nonspecific. No definite abnormal enhancement, and other visible  paraspinal muscles appear normal. There is mild subcutaneous edema in the lumbar spine, significance doubtful in this setting.  Much of the abdominal viscera are obscured on these images.  T11-T12: Grossly normal.  T12-L1:  Negative.  L1-L2:  Negative.  L2-L3:  Negative.  L3-L4: Mild disc space loss and disc desiccation. Mild circumferential disc osteophyte complex. Mild facet hypertrophy. No significant stenosis.  L4-L5:  Negative except for mild facet hypertrophy.  L5-S1: Disc space loss with disc desiccation and right eccentric circumferential disc osteophyte complex. Focal central component (series 6, image 31), but no spinal or definite lateral recess stenosis. No foraminal stenosis.  IMPRESSION: 1. Diffuse loss of normal bone marrow signal, but may be post treatment affect rather than due to widespread osseous metastatic disease. No destructive osseous lesion identified. 2. No evidence of lumbar discitis osteomyelitis. Mild to moderate chronic appearing disc degeneration at L3-L4 and L5-S1. No spinal stenosis. 3. Mild signal abnormality in the right lower lumbar erector spinae musculature, nonspecific. Early infectious myositis is not excluded in this setting.   Electronically Signed   By: Lars Pinks M.D.   On: 11/15/2013 20:30   Ct Abdomen Pelvis W Contrast  11/10/2013   CLINICAL DATA:  Persistent abdominal pain.  EXAM: CT ABDOMEN AND PELVIS WITH CONTRAST  TECHNIQUE: Multidetector CT imaging of the abdomen and pelvis was performed using the standard protocol following bolus administration of intravenous contrast.  CONTRAST:  175mL OMNIPAQUE IOHEXOL 300 MG/ML  SOLN  COMPARISON:  CT of the abdomen and pelvis 10/22/2013.  FINDINGS: Lung Bases: Linear opacities throughout the lung bases bilaterally,  predominantly in a peribronchovascular distribution, favored to represent areas of peribronchial inflammation, potentially related to recent recurrent bouts of aspiration. Markedly thickened distal esophagus.  Numerous serpiginous structures adjacent to the distal esophagus, presumably esophageal varices. Tip of a porta cath in the right atrium.  Abdomen/Pelvis: The liver has a shrunken appearance and nodular contour, compatible with advanced cirrhosis. No definite focal hepatic lesions are noted on today's limited noncontrast CT examination. Numerous surgical clips are noted adjacent to the liver. Status post cholecystectomy. Potential stone or migrated surgical clip in the proximal aspect of the common bile duct demonstrated on images 19 and 20 of series 2, unchanged. The portal vein is dilated measuring 18 mm. The spleen is massively enlarged measuring 18.6 x 14.1 x 17.7 cm (estimated splenic volume of 2,321 mL).  New compared to the recent prior examination there is marked thickening of the colonic wall involving the cecum and ascending colon. These findings are concerning for colitis. More distal aspect of the colon appears uninvolved. No significant volume of ascites. No pneumoperitoneum. No pathologic distention of small bowel. No definite lymphadenopathy identified within the abdomen or pelvis on today's non contrast CT examination. Prostate gland and urinary bladder are unremarkable in appearance. Small umbilical hernia containing only omental fat incidentally noted (unchanged).  Musculoskeletal: Multifocal sclerotic lesions are noted throughout the visualized axial and appendicular skeleton, most apparent within the sacrum at the level of S1, in the right side of the L5 vertebral body, and scattered throughout the remainder of the spine. These findings are similar to prior examinations. At T10 and T11 there is mild compression which is unchanged, with approximately 15-20% loss of height anteriorly at both vertebral body levels. Edema throughout the subcutaneous fat of the flanks bilaterally (right greater than left).  IMPRESSION: 1. Interval development of extensive colonic wall thickening involving the cecum  and ascending colon, concerning for colitis. Clinical correlation is recommended. 2. Stigmata of cirrhosis and portal hypertension, including massive splenomegaly and esophageal varices redemonstrated, as above. 3. The appearance of the lung bases suggest sequela of multiple recent bouts of aspiration. 4. Small umbilical hernia containing only omental fat incidentally noted. 5. 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 6. Additional incidental findings, as above.   Electronically Signed   By: Vinnie Langton M.D.   On: 11/10/2013 19:56   Ct Abdomen Pelvis W Contrast  11/07/2013   CLINICAL DATA:  Abdominal pain  EXAM: CT ABDOMEN AND PELVIS WITH CONTRAST  TECHNIQUE: Multidetector CT imaging of the abdomen and pelvis was performed using the standard protocol following bolus administration of intravenous contrast.  CONTRAST:  52mL OMNIPAQUE IOHEXOL 300 MG/ML  SOLN  COMPARISON:  10/22/2013  FINDINGS: Cirrhotic liver.  Portal vein is grossly patent.  Bibasilar atelectasis.  Splenomegaly.  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  Right pleural effusion and abdominal ascites have resolved. Trace amount of fluid is layering in the pelvis.  Pancreas, kidneys, adrenal glands are within normal limits.  Umbilical hernia containing adipose tissue is stable.  There are areas of wall thickening in the colon. This occurs in the ascending colon on image 53, transverse colon on image 39, and possibly within the sigmoid colon, although the sigmoid colon is decompressed.  Bladder and prostate are unremarkable.  Sclerotic changes within thoracic and lumbar vertebral bodies is not significantly changed. This may be associated with the patient's known history of lymphoma. Lytic areas in the iliac  bones are also noted.  IMPRESSION: Cirrhotic liver.  Stable splenomegaly.  There are areas of wall thickening  throughout the colon. Differential diagnosis includes an inflammatory process or malignancy.  Pleural effusions and abdominal ascites have resolved. Trace fluid persist in the pelvis.   Electronically Signed   By: Maryclare Bean M.D.   On: 11/07/2013 16:11   Ct Abdomen Pelvis W Contrast  10/22/2013   CLINICAL DATA:  Lymphoma, prior cholecystectomy and appendectomy. Status post bone marrow transplant. Abdominal pain and lactic acidosis.  EXAM: CT ABDOMEN AND PELVIS WITH CONTRAST  TECHNIQUE: Multidetector CT imaging of the abdomen and pelvis was performed using the standard protocol following bolus administration of intravenous contrast.  CONTRAST:  133mL OMNIPAQUE IOHEXOL 300 MG/ML  SOLN  COMPARISON:  CT ABD - PELV W/ CM dated 10/16/2013  FINDINGS: Trace pleural effusions are increased since previously, with bibasilar dependent atelectasis.  Splenomegaly is reidentified. Nodular hepatic contour reidentified with abdominal ascites in all 4 quadrants. Interval increase in soft tissue edema compatible with third spacing. Mild irregularity of the omental fat adjacent to the anterior segment right hepatic lobe with adjacent surgical clips noted, image 14. Upper abdominal vascular collaterals likely indicate portal hypertension. Portal vein not visualized, which may indicate chronic thrombosis.  Fat containing umbilical hernia noted. Foci of gas within the anterior abdominal wall subcutaneous fat likely indicating injection locations. Improvement in colonic wall thickening is identified. Adrenal glands, spleen, and kidneys are unremarkable. Cholecystectomy clips are noted. No free air. Small retroperitoneal nodes are reidentified, largest 5 mm in the aortocaval space for example image 33. Bladder is normal. Multiple axial sclerotic osseous lesions are reidentified.  IMPRESSION: Although colonic bowel wall thickening has improved since the previous exam, there has been interval worsening of ascites, pleural effusions, and  subcutaneous soft tissue edema. 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  Right upper quadrant omental nodularity. Omental disease could have this appearance although postsurgical change is also possible given the presence of clips in this area.  Axial skeletal sclerotic lesions suspicious for metastatic disease.   Electronically Signed   By: Conchita Paris M.D.   On: 10/22/2013 13:04   US Venous Img Upper Uni Right  10/20/2013   CLINICAL DATA:  Right arm swelling  EXAM: RIGHT UPPER EXTREMITY VENOUS DOPPLER ULTRASOUND  TECHNIQUE: Gray-scale sonography with graded compression, as well as color Doppler and duplex ultrasound were performed to evaluate the upper extremity deep venous system from the level of the subclavian vein and including the jugular, axillary, basilic and upper cephalic vein. Spectral Doppler was utilized to evaluate flow at rest and with distal augmentation maneuvers.  COMPARISON:  None.  FINDINGS: Thrombus within deep veins:  None visualized.  Compressibility of deep veins:  Normal.  Duplex waveform respiratory phasicity:  Normal.  Duplex waveform response to augmentation:  Normal.  Venous reflux:  None visualized.  Other findings: There is nonocclusive, incompletely compressible mural thrombus in the cephalic vein at the level of the elbow with patent persistent flow through this segment. The more central aspect of the cephalic vein is widely patent and compressible. There is subcutaneous edema in the wrist and upper arm.  IMPRESSION: 1. Negative for right upper extremity DVT. 2. Segmental superficial thrombophlebitis involving cephalic vein.   Electronically Signed   By: Arne Cleveland M.D.   On: 10/20/2013 11:45   Ir Removal [employer] W/o Fl Mod  Sed  11/16/2013   CLINICAL DATA:  History of bacteremia, concern for seeding of patient's chronic Port a Catheter.   EXAM: REMOVAL OF IMPLANTED TUNNELED PORT-A-CATH  MEDICATIONS: The patient is currently admitted to the hospital and receiving intravenous antibiotics; The antibiotic was administered within 1 hour prior to the start of the procedure.  ANESTHESIA/SEDATION: Intravenous Versed, Fentanyl and Dilaudid was administered for conscious sedation  Sedation time: 20  FLUOROSCOPY TIME:  None  PROCEDURE: Informed written consent was obtained from the patient after a discussion of the risk, benefits and alternatives to the procedure. The patient was positioned supine on the fluoroscopy table and the right chest Port-A-Cath site was prepped with chlorhexidine. A sterile gown and gloves were worn during the procedure. Local anesthesia was provided with 1% lidocaine with epinephrine. A timeout was performed prior to the initiation of the procedure.  An incision was made overlying the Port-A-Cath with a #15 scalpel. Utilizing sharp and blunt dissection, the Port-A-Cath was removed completely. The pocked was irrigated with sterile saline. Wound closure was performed with subcutaneous 3-0 Monocryl, subcuticular 4-0 Vicryl and Dermabond. A dressing was placed. The patient tolerated the procedure well without immediate post procedural complication.  FINDINGS: Successful removal of implant Port-A-Cath without immediate post procedural complication.  Visual inspection of the Port a catheter pocket was negative for any definitive evidence of infection and as such, the incision was closed primarily with absorbable suture as above.  IMPRESSION: Successful removal of implanted Port-A-Cath.   Electronically Signed   By: Sandi Mariscal M.D.   On: 11/16/2013 16:57   Dg Abd Acute W/chest  11/06/2013   CLINICAL DATA:  Fever and emesis ; lymphoma  EXAM: ACUTE ABDOMEN SERIES (ABDOMEN 2 VIEW & CHEST 1 VIEW)  COMPARISON:  Chest radiograph Oct 16, 2013; CT abdomen and pelvis Oct 22, 2013  FINDINGS: PA chest: There is no edema or consolidation. Heart size  and pulmonary vascularity are normal. No adenopathy. Port-A-Cath tip is at the cavoatrial junction. No pneumothorax.  Supine and upright abdomen: There remains thickening of scattered bowel loops. There are scattered air-fluid levels. There is no well-defined obstruction or free air. There is moderate stool in the colon. There are surgical clips in the right upper quadrant.  IMPRESSION: There remains some wall thickening of several loops of bowel which given the history, may have neoplastic etiology. Scattered air-fluid levels may represent ileus or enteritis. Early obstruction is a consideration, although enteritis or ileus with tend to be more likely given the overall appearance of the bowel. No free air is seen.  There is no lung edema or consolidation.   Electronically Signed   By: Lowella Grip M.D.   On: 11/06/2013 11:22         Subjective: Patient is in better spirits today. He denies any fevers, chills, chest pain, shortness breath, vomiting, diarrhea, dysuria, hematuria. Abdominal pain is still a bit better. He has less stools today. Complains of nausea with leg cramps and pain.   Objective: Filed Vitals:   11/16/13 2030 11/16/13 2211 11/17/13 0621 11/17/13 1420  BP: 107/55 119/69 124/72 114/63  Pulse: 83 89 89 86  Temp: 98.4 F (36.9 C) 98.3 F (36.8 C) 98.6 F (37 C) 98.6 F (37 C)  TempSrc: Oral Tympanic Oral Oral  Resp:  19 21 20   Height:      Weight:      SpO2: 98% 99% 98% 98%    Intake/Output Summary (Last 24 hours) at 11/17/13 1707 Last data  filed at 11/17/13 1523  Gross per 24 hour  Intake   2490 ml  Output    950 ml  Net   1540 ml   Weight change:  Exam:   General:  Pt is alert, follows commands appropriately, not in acute distress  HEENT: No icterus, No thrush, No neck mass, [place]/AT  Cardiovascular: RRR, S1/S2, no rubs, no gallops  Respiratory: CTA bilaterally, no wheezing, no crackles, no rhonchi  Abdomen: Soft/+BS, non tender, non distended, no  guarding  Extremities: 3+ LE edema, No lymphangitis, No petechiae, No rashes, no synovitis; mild ecchymosis right calf  Data Reviewed: Basic Metabolic Panel:  Recent Labs Lab 11/11/13 0510 11/12/13 0415 11/15/13 0510 11/16/13 0532 11/17/13 0540  NA 132* 138 137 140 138  K 4.6 3.8 3.1* 3.3* 3.5*  CL 104 107 104 107 104  CO2 18* 19 22 23 23   GLUCOSE 64* 81 98 106* 118*  BUN 34* 23 9 7 6   CREATININE 1.47* 0.90 0.73 0.70 0.67  CALCIUM 7.9* 8.5 7.5* 7.5* 7.7*  MG  --  1.6  --  1.3* 1.7   Liver Function Tests: No results found for this basename: AST, ALT, ALKPHOS, BILITOT, PROT, ALBUMIN,  in the last 168 hours No results found for this basename: LIPASE, AMYLASE,  in the last 168 hours No results found for this basename: AMMONIA,  in the last 168 hours CBC:  Recent Labs Lab 11/12/13 0415 11/13/13 0520 11/14/13 0350 11/15/13 0510 11/16/13 0532  WBC 4.4 2.8* 4.3 2.7* 2.1*  HGB 10.4* 8.6* 9.9* 9.0* 8.2*  HCT 29.8* 24.4* 28.8* 25.7* 23.6*  MCV 101.7* 101.2* 102.9* 104.9* 105.4*  PLT 48* 31* 40* 31* 37*   Cardiac Enzymes:  Recent Labs Lab 11/16/13 0532  CKTOTAL 48   BNP: No components found with this basename: POCBNP,  CBG: No results found for this basename: GLUCAP,  in the last 168 hours  Recent Results (from the past 240 hour(s))  STOOL CULTURE     Status: None   Collection Time    11/08/13  2:37 AM      Result Value Ref Range Status   Specimen Description STOOL   Final   Special Requests NONE   Final   Culture     Final   Value: NO SALMONELLA, SHIGELLA, CAMPYLOBACTER, YERSINIA, OR E.COLI 0157:H7 ISOLATED     Note: REDUCED NORMAL FLORA PRESENT     Performed at [facility]   Report Status 11/13/2013 FINAL   Final  OVA AND PARASITE EXAMINATION     Status: None   Collection Time    11/08/13  2:37 AM      Result Value Ref Range Status   Specimen Description STOOL   Final   Special Requests NONE   Final   Ova and parasites     Final   Value: NO OVA  OR PARASITES SEEN     Performed at [facility]   Report Status 11/09/2013 FINAL   Final  CLOSTRIDIUM DIFFICILE BY PCR     Status: None   Collection Time    11/08/13  2:37 AM      Result Value Ref Range Status   C difficile by pcr NEGATIVE  NEGATIVE Final  CULTURE, BLOOD (ROUTINE X 2)     Status: None   Collection Time    11/10/13  4:35 PM      Result Value Ref Range Status   Specimen Description BLOOD LEFT HAND   Final  Special Requests BOTTLES DRAWN AEROBIC ONLY 3CC   Final   Culture  Setup Time     Final   Value: 11/10/2013 22:39     Performed at [facility]   Culture     Final   Value: ENTEROCOCCUS SPECIES     Note: SUSCEPTIBILITIES PERFORMED ON PREVIOUS CULTURE WITHIN THE LAST 5 DAYS.     Note: Gram Stain Report Called to,Read Back By and Verified With: JILL MOORE 11/11/13 @ 2:40PM BY RUSCOE A.     Performed at [facility]   Report Status 11/14/2013 FINAL   Final  CULTURE, BLOOD (ROUTINE X 2)     Status: None   Collection Time    11/10/13  4:50 PM      Result Value Ref Range Status   Specimen Description BLOOD LEFT ANTECUBITAL   Final   Special Requests BOTTLES DRAWN AEROBIC ONLY 10CC   Final   Culture  Setup Time     Final   Value: 11/10/2013 22:39     Performed at [facility]   Culture     Final   Value: ENTEROCOCCUS SPECIES     Note: Gram Stain Report Called to,Read Back By and Verified With: JILL MOORE 11/11/13 @ 4:03PM BY RUSCOE A.     Performed at [facility]   Report Status 11/14/2013 FINAL   Final   Organism ID, Bacteria ENTEROCOCCUS SPECIES   Final  AFB CULTURE, BLOOD     Status: None   Collection Time    11/11/13  9:35 AM      Result Value Ref Range Status   Specimen Description BLOOD LEFT HAND   Final   Special Requests 5CC   Final   Culture     Final   Value: YEAST ISOLATED;ID TO FOLLOW     Note: CRITICAL RESULT CALLED TO, READ BACK BY AND VERIFIED WITH: BRENDA RN @ 10:11AM ON 11/15/13 BY WILSN     Performed at  [facility]   Report Status PENDING   Incomplete  CLOSTRIDIUM DIFFICILE BY PCR     Status: None   Collection Time    11/11/13  4:18 PM      Result Value Ref Range Status   C difficile by pcr NEGATIVE  NEGATIVE Final  CULTURE, BLOOD (ROUTINE X 2)     Status: None   Collection Time    11/11/13  8:25 PM      Result Value Ref Range Status   Specimen Description BLOOD RIGHT ARM   Final   Special Requests BOTTLES DRAWN AEROBIC AND ANAEROBIC 5CC EACH   Final   Culture  Setup Time     Final   Value: 11/12/2013 01:05     Performed at [facility]   Culture     Final   Value: VANCOMYCIN RESISTANT ENTEROCOCCUS ISOLATED     Note: LINEZOLID TO FOLLOW CRITICAL RESULT CALLED TO, READ BACK BY AND VERIFIED WITH: BRENDA HALL 11/15/13 0815 BY SMITHERSJ DAPTOMYCIN=3 UG/ML=SENSITIVE     Note: Gram Stain Report Called to,Read Back By and Verified With: FRANCIS PLEASANT 11/12/13 @ 8:25PM BY RUSCOE A.     Performed at [facility]   Report Status PENDING   Incomplete   Organism ID, Bacteria VANCOMYCIN RESISTANT ENTEROCOCCUS ISOLATED   Final  CULTURE, BLOOD (ROUTINE X 2)     Status: None   Collection Time    11/11/13  8:35 PM      Result Value  Ref Range Status   Specimen Description BLOOD LEFT HAND   Final   Special Requests BOTTLES DRAWN AEROBIC ONLY 1CC   Final   Culture  Setup Time     Final   Value: 11/12/2013 01:05     Performed at [facility]   Culture     Final   Value:        BLOOD CULTURE RECEIVED NO GROWTH TO DATE CULTURE WILL BE HELD FOR 5 DAYS BEFORE ISSUING A FINAL NEGATIVE REPORT     Performed at [facility]   Report Status PENDING   Incomplete  URINE CULTURE     Status: None   Collection Time    11/12/13 11:22 AM      Result Value Ref Range Status   Specimen Description URINE, CLEAN CATCH   Final   Special Requests Immunocompromised   Final   Culture  Setup Time     Final   Value: 11/12/2013 23:37     Performed at [place]     Final   Value: NO GROWTH     Performed at [facility]   Culture     Final   Value: NO GROWTH     Performed at [facility]   Report Status 11/13/2013 FINAL   Final     Scheduled Meds: . atovaquone  1,500 mg Oral Daily  . azithromycin  1,200 mg Oral Weekly  . DAPTOmycin (CUBICIN)  IV  750 mg Intravenous Q24H  . Darunavir Ethanolate  800 mg Oral Q breakfast  . diphenoxylate-atropine  2 tablet Oral TID  . emtricitabine-tenofovir  1 tablet Oral QHS  . feeding supplement (RESOURCE BREEZE)  1 Container Oral TID BM  . furosemide  20 mg Oral Daily  . micafungin [facility]) IV  100 mg Intravenous Daily  . morphine  15 mg Oral BID  . multivitamin with minerals  1 tablet Oral Daily  . nystatin  5 mL Oral QID  . pantoprazole  40 mg Oral BID  . ritonavir  100 mg Oral Q breakfast  . spironolactone  50 mg Oral Daily  . sucralfate  1 g Oral TID WC   Continuous Infusions: . dextrose 5 % and 0.9% NaCl 100 mL/hr at 11/17/13 0741     Shelby Anderle, DO  [facility] Pager [phone]  If 7PM-7AM, please contact night-coverage [email or web address] Password [email or web address] 11/17/2013, 5:07 PM   LOS: 11 days

## 2013-11-17 NOTE — Progress Notes (Signed)
CSW informed by [employer], Tammy, with [facility] that they are able to make a bed offer. Pt was made aware of potential placement in [facility] and agreeable.  Unit CSW to assist with dc and LOG when pt is medically ready.  Hunt Oris, MSW, [place]

## 2013-11-17 NOTE — Progress Notes (Signed)
Physical Therapy Treatment Patient Details Name: Adebayo Ensminger MRN: [id number] DOB: 09-26-1975 Today's Date: 11/17/2013    History of Present Illness Admitted as transfer from [facility] with N/V/D due to persistent colitis over 3 week period.  ?complication of HIV, malnitrition.    PT Comments    Pt can be encouraged to ambulate in the halls if pain is managed.  Pt does clearly feel better after being up, but experiences a lot of pain transitioning from supine to sit to supine.  Follow Up Recommendations  Other (comment) ((long term care-- assisted living vs SNF))     Equipment Recommendations  Rolling walker with 5" wheels    Recommendations for Other Services       Precautions / Restrictions Precautions Precautions: Fall Restrictions Weight Bearing Restrictions: No    Mobility  Bed Mobility Overal bed mobility: Needs Assistance Bed Mobility: Supine to Sit;Sit to Supine     Supine to sit: Min assist Sit to supine: Mod assist   General bed mobility comments: assist due to pain in legs and general body pain  Transfers Overall transfer level: Needs assistance Equipment used: Rolling walker (2 wheeled) Transfers: Sit to/from Stand Sit to Stand: Min assist         General transfer comment: lifting and coming forward assist  Ambulation/Gait Ambulation/Gait assistance: Min guard Ambulation Distance (Feet): 600 Feet Assistive device: Rolling walker (2 wheeled) Gait Pattern/deviations: Step-through pattern Gait velocity: very slow   General Gait Details: slow, gait with relatively little knee flexion bilaterally.  Generally painful appearing.  Up ambulating in the halls or standing  for greater than 30 min.   Stairs            Wheelchair Mobility    Modified Rankin (Stroke Patients Only)       Balance Overall balance assessment: Needs assistance Sitting-balance support: No upper extremity supported Sitting balance-Leahy Scale: Fair     Standing  balance support: No upper extremity supported Standing balance-Leahy Scale: Fair                      Cognition Arousal/Alertness: Awake/alert Behavior During Therapy: WFL for tasks assessed/performed Overall Cognitive Status: Within Functional Limits for tasks assessed                      Exercises      General Comments        Pertinent Vitals/Pain Received dilaudid     Home Living                      Prior Function            PT Goals (current goals can now be found in the care plan section) Acute Rehab PT Goals Patient Stated Goal: Get well and be independent again PT Goal Formulation: With patient Time For Goal Achievement: 11/28/13 Potential to Achieve Goals: Good Progress towards PT goals: Progressing toward goals    Frequency  Min 3X/week    PT Plan Current plan remains appropriate    Co-evaluation             End of Session   Activity Tolerance: Patient tolerated treatment well Patient left: in bed     Time: 7253-6644 PT Time Calculation (min): 43 min  Charges:  $Gait Training: 23-37 mins $Therapeutic Activity: 8-22 mins                    G Codes:  Laury Huizar, Tessie Fass 11/17/2013, 4:48 PM 11/17/2013  Donnella Sham, [place] [phone]  (pager)

## 2013-11-18 DIAGNOSIS — R609 Edema, unspecified: Secondary | ICD-10-CM

## 2013-11-18 DIAGNOSIS — M79609 Pain in unspecified limb: Secondary | ICD-10-CM

## 2013-11-18 DIAGNOSIS — M7989 Other specified soft tissue disorders: Secondary | ICD-10-CM

## 2013-11-18 DIAGNOSIS — C8589 Other specified types of non-Hodgkin lymphoma, extranodal and solid organ sites: Secondary | ICD-10-CM

## 2013-11-18 LAB — CULTURE, BLOOD (ROUTINE X 2): Culture: NO GROWTH

## 2013-11-18 LAB — BASIC METABOLIC PANEL
BUN: 6 mg/dL (ref 6–23)
CALCIUM: 8 mg/dL — AB (ref 8.4–10.5)
CO2: 22 meq/L (ref 19–32)
CREATININE: 0.6 mg/dL (ref 0.50–1.35)
Chloride: 105 mEq/L (ref 96–112)
GFR calc non Af Amer: >90 mL/min (ref >90)
Glucose, Bld: 126 mg/dL — ABNORMAL HIGH (ref 70–99)
Potassium: 3.8 mEq/L (ref 3.7–5.3)
Sodium: 136 mEq/L — ABNORMAL LOW (ref 137–147)

## 2013-11-18 LAB — PROTIME-INR
INR: 2.05 — AB (ref 0.00–1.49)
PROTHROMBIN TIME: 22.5 s — AB (ref 11.6–15.2)

## 2013-11-18 LAB — MAGNESIUM: Magnesium: 1.6 mg/dL (ref 1.5–2.5)

## 2013-11-18 MED ORDER — FUROSEMIDE 10 MG/ML IJ SOLN
20.0000 mg | Freq: Once | INTRAMUSCULAR | Status: AC
  Administered 2013-11-18: 20 mg via INTRAVENOUS

## 2013-11-18 MED ORDER — FUROSEMIDE 40 MG PO TABS
40.0000 mg | ORAL_TABLET | Freq: Every day | ORAL | Status: DC
  Administered 2013-11-19 – 2013-11-25 (×7): 40 mg via ORAL
  Filled 2013-11-18 (×7): qty 1

## 2013-11-18 MED ORDER — PHYTONADIONE 5 MG PO TABS
10.0000 mg | ORAL_TABLET | Freq: Every day | ORAL | Status: DC
  Administered 2013-11-18 – 2013-11-25 (×8): 10 mg via ORAL
  Filled 2013-11-18 (×8): qty 2

## 2013-11-18 MED ORDER — PHYTONADIONE 5 MG PO TABS: 10.0000 mg | ORAL_TABLET | Freq: Every day | ORAL | Status: DC

## 2013-11-18 MED ORDER — FUROSEMIDE 10 MG/ML IJ SOLN
INTRAMUSCULAR | Status: AC
  Filled 2013-11-18: qty 4

## 2013-11-18 NOTE — Progress Notes (Signed)
Clinical Education officer, museum (CSW) met with patient and made him aware that [facility] has offered patient a bed. Patient verbalized his understanding and thanked CSW for the update.   Blima Rich, [place] [facility] CSW [phone]

## 2013-11-18 NOTE — Progress Notes (Addendum)
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 He showed improvement in symptoms there an was discharged off abx on 5/22. He then presented to [facility] ED 11/06/2013 with ongoing fatigue, weakness, diarrhea. He was transferred to [facility] for further care per GI (Dr. Dudley Major in [facility]) recommendations as he thought that the pt requires higher level of care.  Pt was started on flagyl for treatment of possible enteritis. CT abd done showed wall thickening throughout colon. The patient continues to have abdominal pain and diarrhea. Extensive workup for the diarrhea has been negative from an infectious disease standpoint. He has been treated symptomatically with opioids and anti-motility agents. His hospital stay has been complicated and prolonged by enterococcal bacteremia and fungemia. The patient has been started on daptomycin and micafungin with infectious disease following. Because of his persistent bacteremia and fungemia, his Port-A-Cath was removed on 11/16/2013 after some struggles with the patient's coagulopathy. The patient was started on chronic vitamin K replacement for his coagulopathy. His surveillance blood cultures are negative to date. The patient has developed worsening anasarca, and his diuretic dosing has been increased.  Ultimate disposition is to SNF when stable and cleared by ID.   Assessment/Plan: Persistent colitis  -possibly related to AIDS given recurrent symptoms for over 3 weeks and neg w/u  -  repeat stool for Neg CMV PCR, C.diff , stool culture, ova and parasites negative. Diarrhea has somewhat improved on Lomotil but still has abdominal pain.  -Flagyl discontinued on 6/5 but was restarted even worsened colitis seen on repeat CT scan. Flagyl ultimately discontinued on 11/17/2013 -Patient continues to have diarrhea although frequency had reduced in last 5 days. -No nausea or vomiting but has persistent abdominal pain. Lactic acid normalized.  -IV hydration and clear liquid diet.  -GI consulted. No further workup recommended recent negative workups including studies and CMV culture from colon biopsy.--CMV PCR neg -Continue Lomotil for symptomatic treatment  -Colonoscopy 10/27/2013 negative  -10/16/13 stool microsporidia negative  Fungemia  -11/11/13 AFB culture grew yeast  -empiric echinocandin pending identification (D#3)  -appreciate ID followup  -I spoke with hematology, Dr. Alen Blew regarding coagulopathy--stated INR around 2.0 may be as good as it gets--recommends concurrent FFP infusion during port-a-cath removal  -11/16/2013--Port-A-Cath removed  -11/17/2013--repeat blood cultures  -Initially refused procedure on 6/11 but ultimately consented VRE bacteremia/Enteroccocus bacteremia -Etiology do to translocation of bacteria across GI tract from colitis  -Discontinued vancomycin and ampicillin  -Started Cubicin secondary to the patient's thrombocytopenia  -On 6/5 patient was hypotensive and hypoglycemic.  -Repeat CT scan of the abdomen showed worsening colitis.   -Patient has a right-sided Port-A-Cath for chemotherapy placed few years back .  - TEE on 6/8 which was negative for vegetation or thrombus. 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  -pt received 8 units FFP total for port-a-cath removal  Back pain  -MRI back--neg for spondyldiskitis  -start flexeril  HIV/AIDS on HAART  - appreciate ID consult and recommendations  - pt is on ART . CD4 count of 20  - also  on azithromycin and bactrim for PCP  and MAC prophylaxis .monitor Qtc. -Continue mepron  Leg edema/Anasarca/Scrotal Edema -venous duplex--neg for DVT -ck 48  -likely due to hypoalbuminemia/cirrhosis-->anasarca -Previous UA is negative for proteinuria  -No fever or SIRS -increase furosemide -check adolase Large cell lymphoma/Sclerotic bone Lumbosacral spine - Was previously treated with chemotherapy. Reports that he is in remission. 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  -Bone marrow bx done at [facility] recently negative for lymphoma or MAI -Sclerosis in vertebrae not typical for lymphoma--may be due to previous intrathecal chemo/XRT -previously saw heme/onc @[facility] but prior to "Care Everywhere" NSVT on 6/7  low magnesium corrected. EKG within normal limits. No further episodes.  Pancytopenia  - likely bone marrow suppression from HIV/AIDS and liver cirrhosis . No lymphoma seen on recent bone marrow bx done at [facility].  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 Has thrombocytopenia and coagulopathy.  Depression  Continue Lexapro.  Liver cirrhosis  Resumed Aldactone and Lasix.  AKI  Secondary to dehydration. Resolved.  Oral thrush  -On micafungin  Family Communication: Pt at beside  Disposition Plan: SNF when medically stable       Procedures/Studies: Dg Abd 1 View  11/12/2013   CLINICAL DATA:  Colitis.  EXAM: ABDOMEN - 1 VIEW  COMPARISON:  CT of the abdomen and pelvis 11/10/2013.  FINDINGS: Oral contrast material is noted throughout the colon and distal rectum. No pathologic distention of small bowel. No pneumoperitoneum. Multiple surgical clips project over the right upper quadrant of the abdomen. Splenic contour appears enlarged.  IMPRESSION: 1. Nonobstructive bowel gas pattern. 2. No pneumoperitoneum. 3. Splenomegaly.   Electronically Signed   By: Vinnie Langton M.D.   On: 11/12/2013 11:47   Ct Head Wo Contrast  11/10/2013   CLINICAL DATA:  Persistent abdominal  pain.  Dizziness.  Headache.  EXAM: CT HEAD WITHOUT CONTRAST  TECHNIQUE: Contiguous axial images were obtained from the base of the skull through the vertex without intravenous contrast.  COMPARISON:  No priors.  FINDINGS: No acute intracranial abnormalities. Specifically, no evidence of acute intracranial hemorrhage, no definite findings of acute/subacute cerebral ischemia, no mass, mass effect, hydrocephalus or abnormal intra or extra-axial fluid collections. Visualized paranasal sinuses and mastoids are well pneumatized. No acute displaced skull fractures are identified.  IMPRESSION: *No acute intracranial abnormalities. *The appearance of the brain is normal.   Electronically Signed   By: Vinnie Langton M.D.   On: 11/10/2013 19:43   Mr Lumbar Spine W Wo Contrast  11/15/2013   CLINICAL DATA:  38 year old male with bacteremia and new back pain. Severe low back pain radiating to the right lower extremity. Initial encounter. HIV, cirrhosis, lymphoma.  EXAM: MRI LUMBAR SPINE WITHOUT AND WITH CONTRAST  TECHNIQUE: Multiplanar and multiecho pulse sequences of the lumbar spine were obtained without and with intravenous contrast.  CONTRAST:  72mL MULTIHANCE GADOBENATE DIMEGLUMINE 529 MG/ML IV SOLN  COMPARISON:  CT Abdomen and Pelvis 11/10/2013 and earlier.  FINDINGS: Normal lumbar segmentation depicted on comparison.  Diffuse loss of normal bone marrow signal in the visible spine, and left iliac bone. However, no marrow edema or destructive osseous lesion identified. No abnormal vertebral enhancement in the spine.  Visualized lower thoracic spinal cord is normal with conus medularis at L1. Normal cauda equina nerve roots. No abnormal intradural enhancement.  There is mild abnormal STIR and T2 signal in the lower right erector spinae muscles, at the L4 and L5 level (series 5, image 1 and  series 6, image 26). This is nonspecific. No definite abnormal enhancement, and other visible paraspinal muscles appear normal.  There is mild subcutaneous edema in the lumbar spine, significance doubtful in this setting.  Much of the abdominal viscera are obscured on these images.  T11-T12: Grossly normal.  T12-L1:  Negative.  L1-L2:  Negative.  L2-L3:  Negative.  L3-L4: Mild disc space loss and disc desiccation. Mild circumferential disc osteophyte complex. Mild facet hypertrophy. No significant stenosis.  L4-L5:  Negative except for mild facet hypertrophy.  L5-S1: Disc space loss with disc desiccation and right eccentric circumferential disc osteophyte complex. Focal central component (series 6, image 31), but no spinal or definite lateral recess stenosis. No foraminal stenosis.  IMPRESSION: 1. Diffuse loss of normal bone marrow signal, but may be post treatment affect rather than due to widespread osseous metastatic disease. No destructive osseous lesion identified. 2. No evidence of lumbar discitis osteomyelitis. Mild to moderate chronic appearing disc degeneration at L3-L4 and L5-S1. No spinal stenosis. 3. Mild signal abnormality in the right lower lumbar erector spinae musculature, nonspecific. Early infectious myositis is not excluded in this setting.   Electronically Signed   By: Lars Pinks M.D.   On: 11/15/2013 20:30   Ct Abdomen Pelvis W Contrast  11/10/2013   CLINICAL DATA:  Persistent abdominal pain.  EXAM: CT ABDOMEN AND PELVIS WITH CONTRAST  TECHNIQUE: Multidetector CT imaging of the abdomen and pelvis was performed using the standard protocol following bolus administration of intravenous contrast.  CONTRAST:  148mL OMNIPAQUE IOHEXOL 300 MG/ML  SOLN  COMPARISON:  CT of the abdomen and pelvis 10/22/2013.  FINDINGS: Lung Bases: Linear opacities throughout the lung bases bilaterally, predominantly in a peribronchovascular distribution, favored to represent areas of peribronchial inflammation, potentially related to recent recurrent bouts of aspiration. Markedly thickened distal esophagus. Numerous serpiginous structures adjacent  to the distal esophagus, presumably esophageal varices. Tip of a porta cath in the right atrium.  Abdomen/Pelvis: The liver has a shrunken appearance and nodular contour, compatible with advanced cirrhosis. No definite focal hepatic lesions are noted on today's limited noncontrast CT examination. Numerous surgical clips are noted adjacent to the liver. Status post cholecystectomy. Potential stone or migrated surgical clip in the proximal aspect of the common bile duct demonstrated on images 19 and 20 of series 2, unchanged. The portal vein is dilated measuring 18 mm. The spleen is massively enlarged measuring 18.6 x 14.1 x 17.7 cm (estimated splenic volume of 2,321 mL).  New compared to the recent prior examination there is marked thickening of the colonic wall involving the cecum and ascending colon. These findings are concerning for colitis. More distal aspect of the colon appears uninvolved. No significant volume of ascites. No pneumoperitoneum. No pathologic distention of small bowel. No definite lymphadenopathy identified within the abdomen or pelvis on today's non contrast CT examination. Prostate gland and urinary bladder are unremarkable in appearance. Small umbilical hernia containing only omental fat incidentally noted (unchanged).  Musculoskeletal: Multifocal sclerotic lesions are noted throughout the visualized axial and appendicular skeleton, most apparent within the sacrum at the level of S1, in the right side of the L5 vertebral body, and scattered throughout the remainder of the spine. These findings are similar to prior examinations. At T10 and T11 there is mild compression which is unchanged, with approximately 15-20% loss of height anteriorly at both vertebral body levels. Edema throughout the subcutaneous fat of the flanks bilaterally (right greater than left).  IMPRESSION: 1. Interval development of extensive colonic wall  thickening involving the cecum and ascending colon, concerning for  colitis. Clinical correlation is recommended. 2. Stigmata of cirrhosis and portal hypertension, including massive splenomegaly and esophageal varices redemonstrated, as above. 3. The appearance of the lung bases suggest sequela of multiple recent bouts of aspiration. 4. Small umbilical hernia containing only omental fat incidentally noted. 5. 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 6. Additional incidental findings, as above.   Electronically Signed   By: Vinnie Langton M.D.   On: 11/10/2013 19:56   Ct Abdomen Pelvis W Contrast  11/07/2013   CLINICAL DATA:  Abdominal pain  EXAM: CT ABDOMEN AND PELVIS WITH CONTRAST  TECHNIQUE: Multidetector CT imaging of the abdomen and pelvis was performed using the standard protocol following bolus administration of intravenous contrast.  CONTRAST:  16mL OMNIPAQUE IOHEXOL 300 MG/ML  SOLN  COMPARISON:  10/22/2013  FINDINGS: Cirrhotic liver.  Portal vein is grossly patent.  Bibasilar atelectasis.  Splenomegaly.  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  Right pleural effusion and abdominal ascites have resolved. Trace amount of fluid is layering in the pelvis.  Pancreas, kidneys, adrenal glands are within normal limits.  Umbilical hernia containing adipose tissue is stable.  There are areas of wall thickening in the colon. This occurs in the ascending colon on image 53, transverse colon on image 39, and possibly within the sigmoid colon, although the sigmoid colon is decompressed.  Bladder and prostate are unremarkable.  Sclerotic changes within thoracic and lumbar vertebral bodies is not significantly changed. This may be associated with the patient's known history of lymphoma. Lytic areas in the iliac bones are also noted.  IMPRESSION: Cirrhotic liver.  Stable splenomegaly.  There are areas of wall thickening throughout the colon. Differential diagnosis  includes an inflammatory process or malignancy.  Pleural effusions and abdominal ascites have resolved. Trace fluid persist in the pelvis.   Electronically Signed   By: Maryclare Bean M.D.   On: 11/07/2013 16:11   Ct Abdomen Pelvis W Contrast  10/22/2013   CLINICAL DATA:  Lymphoma, prior cholecystectomy and appendectomy. Status post bone marrow transplant. Abdominal pain and lactic acidosis.  EXAM: CT ABDOMEN AND PELVIS WITH CONTRAST  TECHNIQUE: Multidetector CT imaging of the abdomen and pelvis was performed using the standard protocol following bolus administration of intravenous contrast.  CONTRAST:  130mL OMNIPAQUE IOHEXOL 300 MG/ML  SOLN  COMPARISON:  CT ABD - PELV W/ CM dated 10/16/2013  FINDINGS: Trace pleural effusions are increased since previously, with bibasilar dependent atelectasis.  Splenomegaly is reidentified. Nodular hepatic contour reidentified with abdominal ascites in all 4 quadrants. Interval increase in soft tissue edema compatible with third spacing. Mild irregularity of the omental fat adjacent to the anterior segment right hepatic lobe with adjacent surgical clips noted, image 14. Upper abdominal vascular collaterals likely indicate portal hypertension. Portal vein not visualized, which may indicate chronic thrombosis.  Fat containing umbilical hernia noted. Foci of gas within the anterior abdominal wall subcutaneous fat likely indicating injection locations. Improvement in colonic wall thickening is identified. Adrenal glands, spleen, and kidneys are unremarkable. Cholecystectomy clips are noted. No free air. Small retroperitoneal nodes are reidentified, largest 5 mm in the aortocaval space for example image 33. Bladder is normal. Multiple axial sclerotic osseous lesions are reidentified.  IMPRESSION: Although colonic bowel wall thickening has improved since the previous exam, there has been interval worsening of ascites, pleural effusions, and subcutaneous soft tissue edema. 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  Right upper quadrant omental nodularity. Omental disease could have this appearance although postsurgical change is also possible given the presence of clips in this area.  Axial skeletal sclerotic lesions suspicious for metastatic disease.   Electronically Signed   By: Conchita Paris M.D.   On: 10/22/2013 13:04   US Venous Img Upper Uni Right  10/20/2013   CLINICAL DATA:  Right arm swelling  EXAM: RIGHT UPPER EXTREMITY VENOUS DOPPLER ULTRASOUND  TECHNIQUE: Gray-scale sonography with graded compression, as well as color Doppler and duplex ultrasound were performed to evaluate the upper extremity deep venous system from the level of the subclavian vein and including the jugular, axillary, basilic and upper cephalic vein. Spectral Doppler was utilized to evaluate flow at rest and with distal augmentation maneuvers.  COMPARISON:  None.  FINDINGS: Thrombus within deep veins:  None visualized.  Compressibility of deep veins:  Normal.  Duplex waveform respiratory phasicity:  Normal.  Duplex waveform response to augmentation:  Normal.  Venous reflux:  None visualized.  Other findings: There is nonocclusive, incompletely compressible mural thrombus in the cephalic vein at the level of the elbow with patent persistent flow through this segment. The more central aspect of the cephalic vein is widely patent and compressible. There is subcutaneous edema in the wrist and upper arm.  IMPRESSION: 1. Negative for right upper extremity DVT. 2. Segmental superficial thrombophlebitis involving cephalic vein.   Electronically Signed   By: Arne Cleveland M.D.   On: 10/20/2013 11:45   Ir Removal Tun Access W/ Port W/o Fl Mod Sed  11/16/2013   CLINICAL DATA:  History of bacteremia, concern for seeding of patient's chronic Port a Catheter.  EXAM: REMOVAL OF IMPLANTED TUNNELED PORT-A-CATH   MEDICATIONS: The patient is currently admitted to the hospital and receiving intravenous antibiotics; The antibiotic was administered within 1 hour prior to the start of the procedure.  ANESTHESIA/SEDATION: Intravenous Versed, Fentanyl and Dilaudid was administered for conscious sedation  Sedation time: 20  FLUOROSCOPY TIME:  None  PROCEDURE: Informed written consent was obtained from the patient after a discussion of the risk, benefits and alternatives to the procedure. The patient was positioned supine on the fluoroscopy table and the right chest Port-A-Cath site was prepped with chlorhexidine. A sterile gown and gloves were worn during the procedure. Local anesthesia was provided with 1% lidocaine with epinephrine. A timeout was performed prior to the initiation of the procedure.  An incision was made overlying the Port-A-Cath with a #15 scalpel. Utilizing sharp and blunt dissection, the Port-A-Cath was removed completely. The pocked was irrigated with sterile saline. Wound closure was performed with subcutaneous 3-0 Monocryl, subcuticular 4-0 Vicryl and Dermabond. A dressing was placed. The patient tolerated the procedure well without immediate post procedural complication.  FINDINGS: Successful removal of implant Port-A-Cath without immediate post procedural complication.  Visual inspection of the Port a catheter pocket was negative for any definitive evidence of infection and as such, the incision was closed primarily with absorbable suture as above.  IMPRESSION: Successful removal of implanted Port-A-Cath.   Electronically Signed   By: Sandi Mariscal M.D.   On: 11/16/2013 16:57   Dg Abd Acute W/chest  11/06/2013   CLINICAL DATA:  Fever and emesis ; lymphoma  EXAM: ACUTE ABDOMEN SERIES (ABDOMEN 2 VIEW & CHEST 1 VIEW)  COMPARISON:  Chest radiograph Oct 16, 2013; CT abdomen and pelvis  Oct 22, 2013  FINDINGS: PA chest: There is no edema or consolidation. Heart size and pulmonary vascularity are normal. No  adenopathy. Port-A-Cath tip is at the cavoatrial junction. No pneumothorax.  Supine and upright abdomen: There remains thickening of scattered bowel loops. There are scattered air-fluid levels. There is no well-defined obstruction or free air. There is moderate stool in the colon. There are surgical clips in the right upper quadrant.  IMPRESSION: There remains some wall thickening of several loops of bowel which given the history, may have neoplastic etiology. Scattered air-fluid levels may represent ileus or enteritis. Early obstruction is a consideration, although enteritis or ileus with tend to be more likely given the overall appearance of the bowel. No free air is seen.  There is no lung edema or consolidation.   Electronically Signed   By: Lowella Grip M.D.   On: 11/06/2013 11:22         Subjective: Patient denies fevers, chills, headache, chest pain, dyspnea, nausea, vomiting, diarrhea, abdominal pain, dysuria, hematuria  Objective: Filed Vitals:   11/17/13 0621 11/17/13 1420 11/17/13 2152 11/18/13 0515  BP: 124/72 114/63 113/56 127/67  Pulse: 89 86 89 93  Temp: 98.6 F (37 C) 98.6 F (37 C) 99.3 F (37.4 C) 98.9 F (37.2 C)  TempSrc: Oral Oral Oral Oral  Resp: 21 20 21 21   Height:      Weight:      SpO2: 98% 98% 97% 97%    Intake/Output Summary (Last 24 hours) at 11/18/13 1333 Last data filed at 11/18/13 0500  Gross per 24 hour  Intake   1266 ml  Output   1600 ml  Net   -334 ml   Weight change:  Exam:   General:  Pt is alert, follows commands appropriately, not in acute distress  HEENT: No icterus, No thrush,[place]/AT  Cardiovascular: RRR, S1/S2, no rubs, no gallops  Respiratory: CTA bilaterally, no wheezing, no crackles, no rhonchi  Abdomen: Soft/+BS, diffusely tender with anasarca, non distended, no guarding  Extremities: 3+ edema, No lymphangitis, No petechiae, No rashes, no synovitis  Data Reviewed: Basic Metabolic Panel:  Recent Labs Lab  11/12/13 0415 11/15/13 0510 11/16/13 0532 11/17/13 0540 11/18/13 0405  NA 138 137 140 138 136*  K 3.8 3.1* 3.3* 3.5* 3.8  CL 107 104 107 104 105  CO2 19 22 23 23 22   GLUCOSE 81 98 106* 118* 126*  BUN 23 9 7 6 6   CREATININE 0.90 0.73 0.70 0.67 0.60  CALCIUM 8.5 7.5* 7.5* 7.7* 8.0*  MG 1.6  --  1.3* 1.7 1.6   Liver Function Tests: No results found for this basename: AST, ALT, ALKPHOS, BILITOT, PROT, ALBUMIN,  in the last 168 hours No results found for this basename: LIPASE, AMYLASE,  in the last 168 hours No results found for this basename: AMMONIA,  in the last 168 hours CBC:  Recent Labs Lab 11/12/13 0415 11/13/13 0520 11/14/13 0350 11/15/13 0510 11/16/13 0532  WBC 4.4 2.8* 4.3 2.7* 2.1*  HGB 10.4* 8.6* 9.9* 9.0* 8.2*  HCT 29.8* 24.4* 28.8* 25.7* 23.6*  MCV 101.7* 101.2* 102.9* 104.9* 105.4*  PLT 48* 31* 40* 31* 37*   Cardiac Enzymes:  Recent Labs Lab 11/16/13 0532 11/17/13 1647  CKTOTAL 48 55   BNP: No components found with this basename: POCBNP,  CBG: No results found for this basename: GLUCAP,  in the last 168 hours  Recent Results (from the past 240 hour(s))  CULTURE, BLOOD (ROUTINE X 2)  Status: None   Collection Time    11/10/13  4:35 PM      Result Value Ref Range Status   Specimen Description BLOOD LEFT HAND   Final   Special Requests BOTTLES DRAWN AEROBIC ONLY 3CC   Final   Culture  Setup Time     Final   Value: 11/10/2013 22:39     Performed at [facility]   Culture     Final   Value: ENTEROCOCCUS SPECIES     Note: SUSCEPTIBILITIES PERFORMED ON PREVIOUS CULTURE WITHIN THE LAST 5 DAYS.     Note: Gram Stain Report Called to,Read Back By and Verified With: JILL MOORE 11/11/13 @ 2:40PM BY RUSCOE A.     Performed at [facility]   Report Status 11/14/2013 FINAL   Final  CULTURE, BLOOD (ROUTINE X 2)     Status: None   Collection Time    11/10/13  4:50 PM      Result Value Ref Range Status   Specimen Description BLOOD LEFT  ANTECUBITAL   Final   Special Requests BOTTLES DRAWN AEROBIC ONLY 10CC   Final   Culture  Setup Time     Final   Value: 11/10/2013 22:39     Performed at [facility]   Culture     Final   Value: ENTEROCOCCUS SPECIES     Note: Gram Stain Report Called to,Read Back By and Verified With: JILL MOORE 11/11/13 @ 4:03PM BY RUSCOE A.     Performed at [facility]   Report Status 11/14/2013 FINAL   Final   Organism ID, Bacteria ENTEROCOCCUS SPECIES   Final  AFB CULTURE, BLOOD     Status: None   Collection Time    11/11/13  9:35 AM      Result Value Ref Range Status   Specimen Description BLOOD LEFT HAND   Final   Special Requests 5CC   Final   Culture     Final   Value: YEAST ISOLATED;ID TO FOLLOW     Note: CRITICAL RESULT CALLED TO, READ BACK BY AND VERIFIED WITH: BRENDA RN @ 10:11AM ON 11/15/13 BY WILSN     Performed at [facility]   Report Status PENDING   Incomplete  CLOSTRIDIUM DIFFICILE BY PCR     Status: None   Collection Time    11/11/13  4:18 PM      Result Value Ref Range Status   C difficile by pcr NEGATIVE  NEGATIVE Final  CULTURE, BLOOD (ROUTINE X 2)     Status: None   Collection Time    11/11/13  8:25 PM      Result Value Ref Range Status   Specimen Description BLOOD RIGHT ARM   Final   Special Requests BOTTLES DRAWN AEROBIC AND ANAEROBIC 5CC EACH   Final   Culture  Setup Time     Final   Value: 11/12/2013 01:05     Performed at [facility]   Culture     Final   Value: VANCOMYCIN RESISTANT ENTEROCOCCUS ISOLATED     Note: LINEZOLID TO FOLLOW CRITICAL RESULT CALLED TO, READ BACK BY AND VERIFIED WITH: BRENDA HALL 11/15/13 0815 BY SMITHERSJ DAPTOMYCIN=3 UG/ML=SENSITIVE     Note: Gram Stain Report Called to,Read Back By and Verified With: FRANCIS PLEASANT 11/12/13 @ 8:25PM BY RUSCOE A.     Performed at [facility]   Report Status PENDING   Incomplete   Organism ID, Bacteria VANCOMYCIN RESISTANT  ENTEROCOCCUS ISOLATED   Final  CULTURE,  BLOOD (ROUTINE X 2)     Status: None   Collection Time    11/11/13  8:35 PM      Result Value Ref Range Status   Specimen Description BLOOD LEFT HAND   Final   Special Requests BOTTLES DRAWN AEROBIC ONLY 1CC   Final   Culture  Setup Time     Final   Value: 11/12/2013 01:05     Performed at [facility]   Culture     Final   Value:        BLOOD CULTURE RECEIVED NO GROWTH TO DATE CULTURE WILL BE HELD FOR 5 DAYS BEFORE ISSUING A FINAL NEGATIVE REPORT     Performed at [facility]   Report Status PENDING   Incomplete  URINE CULTURE     Status: None   Collection Time    11/12/13 11:22 AM      Result Value Ref Range Status   Specimen Description URINE, CLEAN CATCH   Final   Special Requests Immunocompromised   Final   Culture  Setup Time     Final   Value: 11/12/2013 23:37     Performed at [place]     Final   Value: NO GROWTH     Performed at [facility]   Culture     Final   Value: NO GROWTH     Performed at [facility]   Report Status 11/13/2013 FINAL   Final     Scheduled Meds: . atovaquone  1,500 mg Oral Daily  . azithromycin  1,200 mg Oral Weekly  . DAPTOmycin (CUBICIN)  IV  750 mg Intravenous Q24H  . Darunavir Ethanolate  800 mg Oral Q breakfast  . diphenoxylate-atropine  2 tablet Oral TID  . emtricitabine-tenofovir  1 tablet Oral QHS  . feeding supplement (RESOURCE BREEZE)  1 Container Oral TID BM  . furosemide  20 mg Oral Daily  . micafungin [facility]) IV  100 mg Intravenous Daily  . morphine  15 mg Oral BID  . multivitamin with minerals  1 tablet Oral Daily  . nystatin  5 mL Oral QID  . pantoprazole  40 mg Oral BID  . phytonadione  10 mg Oral Daily  . ritonavir  100 mg Oral Q breakfast  . spironolactone  50 mg Oral Daily  . sucralfate  1 g Oral TID WC   Continuous Infusions:    Marget Outten, DO  [facility] Pager [phone]  If 7PM-7AM, please contact night-coverage [email or web address] Password  [email or web address] 11/18/2013, 1:33 PM   LOS: 12 days

## 2013-11-18 NOTE — Progress Notes (Signed)
VASCULAR LAB PRELIMINARY  PRELIMINARY  PRELIMINARY  PRELIMINARY  Bilateral lower extremity venous Dopplers completed.    Preliminary report:  There is no obvious evidence of DVT or SVT noted in the bilateral lower extremities.   Noami Bove, RVT 11/18/2013, 11:01 AM

## 2013-11-18 NOTE — Progress Notes (Signed)
INFECTIOUS DISEASE PROGRESS NOTE  ID: Samuel Schultz is a 38 y.o. male with  Principal Problem:   Nausea and vomiting in adult Active Problems:   AIDS   Large cell lymphoma   Cirrhosis, non-alcoholic   Pancytopenia   Enteritis   Other pancytopenia   Acute kidney injury   Hypotension, unspecified   Sepsis   Hypoglycemia   Other and unspecified noninfectious gastroenteritis and colitis(558.9)   Gram-positive cocci bacteremia   NSVT (nonsustained ventricular tachycardia)   Protein-calorie malnutrition, severe   VRE bacteremia  Subjective: C/o LE pain and swelling.  One BM today No emesis.   Abtx:  Anti-infectives   Start     Dose/Rate Route Frequency Ordered Stop   11/16/13 1000  DAPTOmycin (CUBICIN) 750 mg in sodium chloride 0.9 % IVPB     750 mg 230 mL/hr over 30 Minutes Intravenous Every 24 hours 11/15/13 0854     11/15/13 1800  cefTRIAXone (ROCEPHIN) 2 g in dextrose 5 % 50 mL IVPB  Status:  Discontinued     2 g 100 mL/hr over 30 Minutes Intravenous Every 24 hours 11/15/13 1746 11/17/13 1159   11/15/13 1200  micafungin (MYCAMINE) 100 mg in sodium chloride 0.9 % 100 mL IVPB     100 mg 100 mL/hr over 1 Hours Intravenous Daily 11/15/13 1110     11/15/13 0845  DAPTOmycin (CUBICIN) 554.5 mg in sodium chloride 0.9 % IVPB  Status:  Discontinued     6 mg/kg  92.4 kg 222.2 mL/hr over 30 Minutes Intravenous Every 24 hours 11/15/13 0844 11/15/13 0854   11/14/13 2000  vancomycin (VANCOCIN) IVPB 1000 mg/200 mL premix  Status:  Discontinued     1,000 mg 200 mL/hr over 60 Minutes Intravenous Every 8 hours 11/14/13 1105 11/15/13 0844   11/14/13 1200  fluconazole (DIFLUCAN) IVPB 100 mg  Status:  Discontinued     100 mg 50 mL/hr over 60 Minutes Intravenous Every 24 hours 11/14/13 1101 11/15/13 1110   11/13/13 1615  ampicillin (OMNIPEN) 2 g in sodium chloride 0.9 % 50 mL IVPB  Status:  Discontinued     2 g 150 mL/hr over 20 Minutes Intravenous 6 times per day 11/13/13 1611 11/15/13  0844   11/11/13 1830  cefTRIAXone (ROCEPHIN) 2 g in dextrose 5 % 50 mL IVPB  Status:  Discontinued     2 g 100 mL/hr over 30 Minutes Intravenous Every 24 hours 11/11/13 1744 11/13/13 1611   11/11/13 1745  metroNIDAZOLE (FLAGYL) tablet 500 mg  Status:  Discontinued     500 mg Oral 3 times per day 11/11/13 1744 11/17/13 1159   11/11/13 1600  vancomycin (VANCOCIN) IVPB 1000 mg/200 mL premix  Status:  Discontinued     1,000 mg 200 mL/hr over 60 Minutes Intravenous Every 12 hours 11/11/13 1555 11/14/13 1105   11/11/13 1000  fluconazole (DIFLUCAN) IVPB 100 mg  Status:  Discontinued     100 mg 50 mL/hr over 60 Minutes Intravenous Every 24 hours 11/11/13 0907 11/14/13 1101   11/11/13 0815  metroNIDAZOLE (FLAGYL) IVPB 500 mg  Status:  Discontinued     500 mg 100 mL/hr over 60 Minutes Intravenous Every 8 hours 11/11/13 0801 11/11/13 1144   11/10/13 2145  piperacillin-tazobactam (ZOSYN) IVPB 3.375 g  Status:  Discontinued     3.375 g 12.5 mL/hr over 240 Minutes Intravenous Every 8 hours 11/10/13 2133 11/11/13 1744   11/10/13 1000  azithromycin (ZITHROMAX) tablet 1,200 mg     1,200 mg Oral  Weekly 11/07/13 1141     11/10/13 0800  Darunavir Ethanolate (PREZISTA) tablet 800 mg     800 mg Oral Daily with breakfast 11/09/13 1320     11/07/13 1150  sulfamethoxazole-trimethoprim (BACTRIM DS) 800-160 MG per tablet 1 tablet  Status:  Discontinued     1 tablet Oral Daily 11/07/13 1150 11/08/13 1729   11/07/13 0800  ritonavir (NORVIR) tablet 100 mg     100 mg Oral Daily with breakfast 11/06/13 1701     11/06/13 2200  emtricitabine-tenofovir (TRUVADA) 200-300 MG per tablet 1 tablet     1 tablet Oral Daily at bedtime 11/06/13 1701     11/06/13 1715  atovaquone (MEPRON) 750 MG/5ML suspension 1,500 mg     1,500 mg Oral Daily 11/06/13 1701     11/06/13 1715  Darunavir Ethanolate (PREZISTA) tablet 800 mg  Status:  Discontinued     800 mg Oral Daily 11/06/13 1701 11/09/13 1320   11/06/13 1715  metroNIDAZOLE  (FLAGYL) IVPB 500 mg  Status:  Discontinued     500 mg 100 mL/hr over 60 Minutes Intravenous Every 8 hours 11/06/13 1710 11/10/13 1511      Medications:  Scheduled: . atovaquone  1,500 mg Oral Daily  . azithromycin  1,200 mg Oral Weekly  . DAPTOmycin (CUBICIN)  IV  750 mg Intravenous Q24H  . Darunavir Ethanolate  800 mg Oral Q breakfast  . diphenoxylate-atropine  2 tablet Oral TID  . emtricitabine-tenofovir  1 tablet Oral QHS  . feeding supplement (RESOURCE BREEZE)  1 Container Oral TID BM  . furosemide  20 mg Oral Daily  . micafungin [facility]) IV  100 mg Intravenous Daily  . morphine  15 mg Oral BID  . multivitamin with minerals  1 tablet Oral Daily  . nystatin  5 mL Oral QID  . pantoprazole  40 mg Oral BID  . ritonavir  100 mg Oral Q breakfast  . spironolactone  50 mg Oral Daily  . sucralfate  1 g Oral TID WC    Objective: Vital signs in last 24 hours: Temp:  [98.6 F (37 C)-99.3 F (37.4 C)] 98.9 F (37.2 C) (06/13 0515) Pulse Rate:  [86-93] 93 (06/13 0515) Resp:  [20-21] 21 (06/13 0515) BP: (113-127)/(56-67) 127/67 mmHg (06/13 0515) SpO2:  [97 %-98 %] 97 % (06/13 0515)   General appearance: alert, cooperative, fatigued and mild distress Eyes: negative findings: pupils equal, round, reactive to light and accomodation Throat: normal findings: oropharynx pink & moist without lesions or evidence of thrush Resp: clear to auscultation bilaterally Cardio: regular rate and rhythm GI: normal findings: bowel sounds normal and soft, non-tender and abnormal findings:  distended Extremities: edema anasarca to abd and large erythematous (?bruise) on R lateral calf  Lab Results  Recent Labs  11/16/13 0532 11/17/13 0540 11/18/13 0405  WBC 2.1*  --   --   HGB 8.2*  --   --   HCT 23.6*  --   --   NA 140 138 136*  K 3.3* 3.5* 3.8  CL 107 104 105  CO2 23 23 22   BUN 7 6 6   CREATININE 0.70 0.67 0.60   Liver Panel No results found for this basename: PROT, ALBUMIN, AST,  ALT, ALKPHOS, BILITOT, BILIDIR, IBILI,  in the last 72 hours Sedimentation Rate No results found for this basename: ESRSEDRATE,  in the last 72 hours C-Reactive Protein No results found for this basename: CRP,  in the last 72 hours  Microbiology: Recent Results (from  the past 240 hour(s))  CULTURE, BLOOD (ROUTINE X 2)     Status: None   Collection Time    11/10/13  4:35 PM      Result Value Ref Range Status   Specimen Description BLOOD LEFT HAND   Final   Special Requests BOTTLES DRAWN AEROBIC ONLY 3CC   Final   Culture  Setup Time     Final   Value: 11/10/2013 22:39     Performed at [facility]   Culture     Final   Value: ENTEROCOCCUS SPECIES     Note: SUSCEPTIBILITIES PERFORMED ON PREVIOUS CULTURE WITHIN THE LAST 5 DAYS.     Note: Gram Stain Report Called to,Read Back By and Verified With: JILL MOORE 11/11/13 @ 2:40PM BY RUSCOE A.     Performed at [facility]   Report Status 11/14/2013 FINAL   Final  CULTURE, BLOOD (ROUTINE X 2)     Status: None   Collection Time    11/10/13  4:50 PM      Result Value Ref Range Status   Specimen Description BLOOD LEFT ANTECUBITAL   Final   Special Requests BOTTLES DRAWN AEROBIC ONLY 10CC   Final   Culture  Setup Time     Final   Value: 11/10/2013 22:39     Performed at [facility]   Culture     Final   Value: ENTEROCOCCUS SPECIES     Note: Gram Stain Report Called to,Read Back By and Verified With: JILL MOORE 11/11/13 @ 4:03PM BY RUSCOE A.     Performed at [facility]   Report Status 11/14/2013 FINAL   Final   Organism ID, Bacteria ENTEROCOCCUS SPECIES   Final  AFB CULTURE, BLOOD     Status: None   Collection Time    11/11/13  9:35 AM      Result Value Ref Range Status   Specimen Description BLOOD LEFT HAND   Final   Special Requests 5CC   Final   Culture     Final   Value: YEAST ISOLATED;ID TO FOLLOW     Note: CRITICAL RESULT CALLED TO, READ BACK BY AND VERIFIED WITH: BRENDA RN @ 10:11AM ON 11/15/13 BY  WILSN     Performed at [facility]   Report Status PENDING   Incomplete  CLOSTRIDIUM DIFFICILE BY PCR     Status: None   Collection Time    11/11/13  4:18 PM      Result Value Ref Range Status   C difficile by pcr NEGATIVE  NEGATIVE Final  CULTURE, BLOOD (ROUTINE X 2)     Status: None   Collection Time    11/11/13  8:25 PM      Result Value Ref Range Status   Specimen Description BLOOD RIGHT ARM   Final   Special Requests BOTTLES DRAWN AEROBIC AND ANAEROBIC 5CC EACH   Final   Culture  Setup Time     Final   Value: 11/12/2013 01:05     Performed at [facility]   Culture     Final   Value: VANCOMYCIN RESISTANT ENTEROCOCCUS ISOLATED     Note: LINEZOLID TO FOLLOW CRITICAL RESULT CALLED TO, READ BACK BY AND VERIFIED WITH: BRENDA HALL 11/15/13 0815 BY SMITHERSJ DAPTOMYCIN=3 UG/ML=SENSITIVE     Note: Gram Stain Report Called to,Read Back By and Verified With: FRANCIS PLEASANT 11/12/13 @ 8:25PM BY RUSCOE A.     Performed at [facility]  Report Status PENDING   Incomplete   Organism ID, Bacteria VANCOMYCIN RESISTANT ENTEROCOCCUS ISOLATED   Final  CULTURE, BLOOD (ROUTINE X 2)     Status: None   Collection Time    11/11/13  8:35 PM      Result Value Ref Range Status   Specimen Description BLOOD LEFT HAND   Final   Special Requests BOTTLES DRAWN AEROBIC ONLY 1CC   Final   Culture  Setup Time     Final   Value: 11/12/2013 01:05     Performed at [facility]   Culture     Final   Value:        BLOOD CULTURE RECEIVED NO GROWTH TO DATE CULTURE WILL BE HELD FOR 5 DAYS BEFORE ISSUING A FINAL NEGATIVE REPORT     Performed at [facility]   Report Status PENDING   Incomplete  URINE CULTURE     Status: None   Collection Time    11/12/13 11:22 AM      Result Value Ref Range Status   Specimen Description URINE, CLEAN CATCH   Final   Special Requests Immunocompromised   Final   Culture  Setup Time     Final   Value: 11/12/2013 23:37     Performed at  [place]     Final   Value: NO GROWTH     Performed at [facility]   Culture     Final   Value: NO GROWTH     Performed at [facility]   Report Status 11/13/2013 FINAL   Final    Studies/Results: Ir Removal Tun Access W/ Port W/o Fl Mod Sed  11/16/2013   CLINICAL DATA:  History of bacteremia, concern for seeding of patient's chronic Port a Catheter.  EXAM: REMOVAL OF IMPLANTED TUNNELED PORT-A-CATH  MEDICATIONS: The patient is currently admitted to the hospital and receiving intravenous antibiotics; The antibiotic was administered within 1 hour prior to the start of the procedure.  ANESTHESIA/SEDATION: Intravenous Versed, Fentanyl and Dilaudid was administered for conscious sedation  Sedation time: 20  FLUOROSCOPY TIME:  None  PROCEDURE: Informed written consent was obtained from the patient after a discussion of the risk, benefits and alternatives to the procedure. The patient was positioned supine on the fluoroscopy table and the right chest Port-A-Cath site was prepped with chlorhexidine. A sterile gown and gloves were worn during the procedure. Local anesthesia was provided with 1% lidocaine with epinephrine. A timeout was performed prior to the initiation of the procedure.  An incision was made overlying the Port-A-Cath with a #15 scalpel. Utilizing sharp and blunt dissection, the Port-A-Cath was removed completely. The pocked was irrigated with sterile saline. Wound closure was performed with subcutaneous 3-0 Monocryl, subcuticular 4-0 Vicryl and Dermabond. A dressing was placed. The patient tolerated the procedure well without immediate post procedural complication.  FINDINGS: Successful removal of implant Port-A-Cath without immediate post procedural complication.  Visual inspection of the Port a catheter pocket was negative for any definitive evidence of infection and as such, the incision was closed primarily with absorbable suture as above.   IMPRESSION: Successful removal of implanted Port-A-Cath.   Electronically Signed   By: Sandi Mariscal M.D.   On: 11/16/2013 16:57     Assessment/Plan: AIDS (CD4 20) Lymphoma- last Chemo 2011 Pancytopenia VRE bacteremia, Enterococcal bacteremia Fungemia (Crypto Ag-) N/V, diarrhea/colitis (CMV PCR -) Anasarca  Total days of antibiotics: 3 micafungin/dapto  Darunavir/norvir/truvada  CK  normal however would consider MRI of his LE to eval for myositis? His abd CT does not show obstructive lesions or massive lymphadenopathy that could cause his LE edema Await speciation of his fungus.  Consider onc eval if not improved on Monday (he has sclerotic bone lesions)         Bobby Rumpf Infectious Diseases (pager) [phone] [email or web address] 11/18/2013, 12:13 PM  LOS: 12 days   **Disclaimer: This note may have been dictated with voice recognition software. Similar sounding words can inadvertently be transcribed and this note may contain transcription errors which may not have been corrected upon publication of note.**

## 2013-11-19 DIAGNOSIS — B49 Unspecified mycosis: Secondary | ICD-10-CM

## 2013-11-19 LAB — BASIC METABOLIC PANEL
BUN: 6 mg/dL (ref 6–23)
CHLORIDE: 102 meq/L (ref 96–112)
CO2: 24 mEq/L (ref 19–32)
Calcium: 8.1 mg/dL — ABNORMAL LOW (ref 8.4–10.5)
Creatinine, Ser: 0.62 mg/dL (ref 0.50–1.35)
GFR calc non Af Amer: >90 mL/min (ref >90)
GLUCOSE: 86 mg/dL (ref 70–99)
POTASSIUM: 3.5 meq/L — AB (ref 3.7–5.3)
Sodium: 138 mEq/L (ref 137–147)

## 2013-11-19 LAB — PROTIME-INR
INR: 2.03 — ABNORMAL HIGH (ref 0.00–1.49)
Prothrombin Time: 22.3 seconds — ABNORMAL HIGH (ref 11.6–15.2)

## 2013-11-19 MED ORDER — CYCLOBENZAPRINE HCL 5 MG PO TABS
5.0000 mg | ORAL_TABLET | Freq: Every day | ORAL | Status: DC
  Administered 2013-11-19 – 2013-11-24 (×6): 5 mg via ORAL
  Filled 2013-11-19 (×10): qty 1

## 2013-11-19 MED ORDER — SPIRONOLACTONE 100 MG PO TABS
100.0000 mg | ORAL_TABLET | Freq: Every day | ORAL | Status: DC
  Administered 2013-11-20 – 2013-11-25 (×6): 100 mg via ORAL
  Filled 2013-11-19 (×6): qty 1

## 2013-11-19 MED ORDER — MORPHINE SULFATE ER 30 MG PO TBCR
30.0000 mg | EXTENDED_RELEASE_TABLET | Freq: Two times a day (BID) | ORAL | Status: DC
  Administered 2013-11-19 – 2013-11-25 (×12): 30 mg via ORAL
  Filled 2013-11-19 (×12): qty 1

## 2013-11-19 NOTE — Progress Notes (Signed)
PROGRESS NOTE  Doral Ventrella ZCH:[id number] DOB: 05-Jun-1976 DOA: 11/06/2013 PCP: PROVIDER NOT IN SYSTEM   Brief history  38 year old male with past medical history of HIV/AIDS on HAART (last CD4 10/18/2013 20), large cell lymphoma with liver infiltration (treawted with R-[facility] in 2010-2012 (followed at [facility]), recent admission for colitis (transferred to [facility] on 10/22/2013) where he had full w/up including negative stool studies, cx , negative colonoscopy and a bone marrow bx showing hypercellular marrow without finidings of lymphoma or leukemia. He showed improvement in symptoms there an was discharged off abx on 5/22. He then presented to [facility] ED 11/06/2013 with ongoing fatigue, weakness, diarrhea. He was transferred to [facility] for further care per GI (Dr. Dudley Major in [facility]) recommendations as he thought that the pt requires higher level of care.  Pt was started on flagyl for treatment of possible enteritis. CT abd done showed wall thickening throughout colon. The patient continues to have abdominal pain and diarrhea. Extensive workup for the diarrhea has been negative from an infectious disease standpoint. He has been treated symptomatically with opioids and anti-motility agents. His hospital stay has been complicated and prolonged by enterococcal bacteremia and fungemia. The patient has been started on daptomycin and micafungin with infectious disease following. Because of his persistent bacteremia and fungemia, his Port-A-Cath was removed on 11/16/2013 after some struggles with the patient's coagulopathy. The patient was started on chronic vitamin K replacement for his coagulopathy. His surveillance blood cultures are negative to date. The patient has developed worsening anasarca, and his diuretic dosing has been increased. Ultimate disposition is to SNF when stable and cleared by ID.  Assessment/Plan:  Persistent colitis  -possibly related to AIDS given recurrent symptoms for over 3 weeks and neg w/u  -  repeat stool for Neg CMV PCR, C.diff , stool culture, ova and parasites negative. Diarrhea has somewhat improved on Lomotil but still has abdominal pain.  -Flagyl discontinued on 6/5 but was restarted even worsened colitis seen on repeat CT scan. Flagyl ultimately discontinued on 11/17/2013  -Patient continues to have diarrhea although frequency had reduced in last 5 days. -No nausea or vomiting but has persistent abdominal pain. Lactic acid normalized.  -IV hydration and clear liquid diet.  -GI consulted. 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 Alen Blew regarding coagulopathy--stated INR around 2.0 may be as good as it gets--recommends concurrent FFP infusion during port-a-cath removal  -11/16/2013--Port-A-Cath removed  -11/17/2013--repeat blood cultures  -Initially refused procedure on 6/11 but ultimately consented  VRE bacteremia/Enteroccocus bacteremia  -Etiology do to translocation of bacteria across GI tract from colitis  -Discontinued vancomycin and ampicillin  -Started Cubicin secondary to the patient's thrombocytopenia  -On 6/5 patient was hypotensive and hypoglycemic.  -Repeat CT scan of the abdomen showed worsening colitis.  -Patient has a right-sided Port-A-Cath for chemotherapy placed few years back .  - TEE on 6/8 which was negative for vegetation or thrombus. 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  -pt received 8 units FFP total for port-a-cath removal  Back pain  -MRI back--neg for spondyldiskitis  -start flexeril  -increase MS Contin to 30mg  q12h HIV/AIDS on HAART  - appreciate ID consult and recommendations  -  pt is on  ART . CD4 count of 20  - also on azithromycin and bactrim for PCP and MAC prophylaxis .monitor Qtc. -Continue mepron  Leg edema/Anasarca/Scrotal Edema  -venous duplex--neg for DVT  -ck 48  -likely due to hypoalbuminemia/cirrhosis-->anasarca  -Previous UA is negative for proteinuria  -No fever or SIRS  -increase furosemide and aldactone -check adolase  Large cell lymphoma/Sclerotic bone Lumbosacral spine  - Was previously treated with chemotherapy. Reports that he is in remission. 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  -Bone marrow bx done at [facility] recently negative for lymphoma or MAI  -Sclerosis in vertebrae not typical for lymphoma--may be due to previous intrathecal chemo/XRT  -previously saw heme/onc @[facility] but prior to "Care Everywhere"  NSVT on 6/7  low magnesium corrected. EKG within normal limits. No further episodes.  Pancytopenia  - likely bone marrow suppression from HIV/AIDS and liver cirrhosis . No lymphoma seen on recent bone marrow bx done at [facility].  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 Has thrombocytopenia and coagulopathy.  -start daily vitamin K Depression  Continue Lexapro.  Liver cirrhosis  Resumed Aldactone and Lasix.  AKI  Secondary to dehydration. Resolved.  Oral thrush  -On micafungin  Family Communication: Pt at beside  Disposition Plan: SNF when medically stable     Procedures/Studies: Dg Abd 1 View  11/12/2013   CLINICAL DATA:  Colitis.  EXAM: ABDOMEN - 1 VIEW  COMPARISON:  CT of the abdomen and pelvis 11/10/2013.  FINDINGS: Oral contrast material is noted throughout the colon and distal rectum. No pathologic distention of small bowel. No pneumoperitoneum. Multiple surgical clips project over the right upper quadrant of the abdomen. Splenic contour appears enlarged.  IMPRESSION: 1. Nonobstructive bowel gas pattern. 2. No pneumoperitoneum. 3. Splenomegaly.   Electronically Signed   By: Vinnie Langton M.D.   On: 11/12/2013 11:47   Ct Head Wo Contrast  11/10/2013   CLINICAL DATA:  Persistent abdominal pain.  Dizziness.  Headache.  EXAM: CT HEAD WITHOUT CONTRAST  TECHNIQUE: Contiguous axial images were obtained from the base of the skull through the vertex without intravenous contrast.  COMPARISON:  No priors.  FINDINGS: No acute intracranial abnormalities. Specifically, no evidence of acute intracranial hemorrhage, no definite findings of acute/subacute cerebral ischemia, no mass, mass effect, hydrocephalus or abnormal intra or extra-axial fluid collections. Visualized paranasal sinuses and mastoids are well pneumatized. No acute displaced skull fractures are identified.  IMPRESSION: *No acute intracranial abnormalities. *The appearance of the brain is normal.   Electronically Signed   By: Vinnie Langton M.D.   On: 11/10/2013 19:43   Mr Lumbar Spine W Wo Contrast  11/15/2013   CLINICAL DATA:  38 year old male with bacteremia and new back pain. Severe low back pain radiating to the right lower extremity. Initial encounter. HIV, cirrhosis, lymphoma.  EXAM: MRI LUMBAR SPINE WITHOUT AND WITH CONTRAST  TECHNIQUE: Multiplanar and multiecho pulse sequences of the lumbar spine were obtained without and with intravenous contrast.  CONTRAST:  31mL MULTIHANCE GADOBENATE DIMEGLUMINE 529 MG/ML IV SOLN  COMPARISON:  CT Abdomen and Pelvis 11/10/2013 and earlier.  FINDINGS: Normal lumbar segmentation depicted on comparison.  Diffuse loss of normal bone marrow signal in the visible spine, and left iliac bone. However, no marrow edema or destructive osseous lesion identified. No abnormal vertebral enhancement in the spine.  Visualized lower thoracic spinal cord is normal with conus medularis at L1. Normal cauda equina nerve roots. No abnormal intradural enhancement.  There  is mild abnormal STIR and T2 signal in the lower right erector spinae muscles, at the L4 and L5 level (series 5, image 1 and series 6, image 26).  This is nonspecific. No definite abnormal enhancement, and other visible paraspinal muscles appear normal. There is mild subcutaneous edema in the lumbar spine, significance doubtful in this setting.  Much of the abdominal viscera are obscured on these images.  T11-T12: Grossly normal.  T12-L1:  Negative.  L1-L2:  Negative.  L2-L3:  Negative.  L3-L4: Mild disc space loss and disc desiccation. Mild circumferential disc osteophyte complex. Mild facet hypertrophy. No significant stenosis.  L4-L5:  Negative except for mild facet hypertrophy.  L5-S1: Disc space loss with disc desiccation and right eccentric circumferential disc osteophyte complex. Focal central component (series 6, image 31), but no spinal or definite lateral recess stenosis. No foraminal stenosis.  IMPRESSION: 1. Diffuse loss of normal bone marrow signal, but may be post treatment affect rather than due to widespread osseous metastatic disease. No destructive osseous lesion identified. 2. No evidence of lumbar discitis osteomyelitis. Mild to moderate chronic appearing disc degeneration at L3-L4 and L5-S1. No spinal stenosis. 3. Mild signal abnormality in the right lower lumbar erector spinae musculature, nonspecific. Early infectious myositis is not excluded in this setting.   Electronically Signed   By: Lars Pinks M.D.   On: 11/15/2013 20:30   Ct Abdomen Pelvis W Contrast  11/10/2013   CLINICAL DATA:  Persistent abdominal pain.  EXAM: CT ABDOMEN AND PELVIS WITH CONTRAST  TECHNIQUE: Multidetector CT imaging of the abdomen and pelvis was performed using the standard protocol following bolus administration of intravenous contrast.  CONTRAST:  115mL OMNIPAQUE IOHEXOL 300 MG/ML  SOLN  COMPARISON:  CT of the abdomen and pelvis 10/22/2013.  FINDINGS: Lung Bases: Linear opacities throughout the lung bases bilaterally, predominantly in a peribronchovascular distribution, favored to represent areas of peribronchial inflammation, potentially related to recent  recurrent bouts of aspiration. Markedly thickened distal esophagus. Numerous serpiginous structures adjacent to the distal esophagus, presumably esophageal varices. Tip of a porta cath in the right atrium.  Abdomen/Pelvis: The liver has a shrunken appearance and nodular contour, compatible with advanced cirrhosis. No definite focal hepatic lesions are noted on today's limited noncontrast CT examination. Numerous surgical clips are noted adjacent to the liver. Status post cholecystectomy. Potential stone or migrated surgical clip in the proximal aspect of the common bile duct demonstrated on images 19 and 20 of series 2, unchanged. The portal vein is dilated measuring 18 mm. The spleen is massively enlarged measuring 18.6 x 14.1 x 17.7 cm (estimated splenic volume of 2,321 mL).  New compared to the recent prior examination there is marked thickening of the colonic wall involving the cecum and ascending colon. These findings are concerning for colitis. More distal aspect of the colon appears uninvolved. No significant volume of ascites. No pneumoperitoneum. No pathologic distention of small bowel. No definite lymphadenopathy identified within the abdomen or pelvis on today's non contrast CT examination. Prostate gland and urinary bladder are unremarkable in appearance. Small umbilical hernia containing only omental fat incidentally noted (unchanged).  Musculoskeletal: Multifocal sclerotic lesions are noted throughout the visualized axial and appendicular skeleton, most apparent within the sacrum at the level of S1, in the right side of the L5 vertebral body, and scattered throughout the remainder of the spine. These findings are similar to prior examinations. At T10 and T11 there is mild compression which is unchanged, with approximately 15-20% loss of height anteriorly at both  vertebral body levels. Edema throughout the subcutaneous fat of the flanks bilaterally (right greater than left).  IMPRESSION: 1. Interval  development of extensive colonic wall thickening involving the cecum and ascending colon, concerning for colitis. Clinical correlation is recommended. 2. Stigmata of cirrhosis and portal hypertension, including massive splenomegaly and esophageal varices redemonstrated, as above. 3. The appearance of the lung bases suggest sequela of multiple recent bouts of aspiration. 4. Small umbilical hernia containing only omental fat incidentally noted. 5. 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 6. Additional incidental findings, as above.   Electronically Signed   By: Vinnie Langton M.D.   On: 11/10/2013 19:56   Ct Abdomen Pelvis W Contrast  11/07/2013   CLINICAL DATA:  Abdominal pain  EXAM: CT ABDOMEN AND PELVIS WITH CONTRAST  TECHNIQUE: Multidetector CT imaging of the abdomen and pelvis was performed using the standard protocol following bolus administration of intravenous contrast.  CONTRAST:  52mL OMNIPAQUE IOHEXOL 300 MG/ML  SOLN  COMPARISON:  10/22/2013  FINDINGS: Cirrhotic liver.  Portal vein is grossly patent.  Bibasilar atelectasis.  Splenomegaly.  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  Right pleural effusion and abdominal ascites have resolved. Trace amount of fluid is layering in the pelvis.  Pancreas, kidneys, adrenal glands are within normal limits.  Umbilical hernia containing adipose tissue is stable.  There are areas of wall thickening in the colon. This occurs in the ascending colon on image 53, transverse colon on image 39, and possibly within the sigmoid colon, although the sigmoid colon is decompressed.  Bladder and prostate are unremarkable.  Sclerotic changes within thoracic and lumbar vertebral bodies is not significantly changed. This may be associated with the patient's known history of lymphoma. Lytic areas in the iliac bones are also noted.  IMPRESSION: Cirrhotic  liver.  Stable splenomegaly.  There are areas of wall thickening throughout the colon. Differential diagnosis includes an inflammatory process or malignancy.  Pleural effusions and abdominal ascites have resolved. Trace fluid persist in the pelvis.   Electronically Signed   By: Maryclare Bean M.D.   On: 11/07/2013 16:11   Ct Abdomen Pelvis W Contrast  10/22/2013   CLINICAL DATA:  Lymphoma, prior cholecystectomy and appendectomy. Status post bone marrow transplant. Abdominal pain and lactic acidosis.  EXAM: CT ABDOMEN AND PELVIS WITH CONTRAST  TECHNIQUE: Multidetector CT imaging of the abdomen and pelvis was performed using the standard protocol following bolus administration of intravenous contrast.  CONTRAST:  118mL OMNIPAQUE IOHEXOL 300 MG/ML  SOLN  COMPARISON:  CT ABD - PELV W/ CM dated 10/16/2013  FINDINGS: Trace pleural effusions are increased since previously, with bibasilar dependent atelectasis.  Splenomegaly is reidentified. Nodular hepatic contour reidentified with abdominal ascites in all 4 quadrants. Interval increase in soft tissue edema compatible with third spacing. Mild irregularity of the omental fat adjacent to the anterior segment right hepatic lobe with adjacent surgical clips noted, image 14. Upper abdominal vascular collaterals likely indicate portal hypertension. Portal vein not visualized, which may indicate chronic thrombosis.  Fat containing umbilical hernia noted. Foci of gas within the anterior abdominal wall subcutaneous fat likely indicating injection locations. Improvement in colonic wall thickening is identified. Adrenal glands, spleen, and kidneys are unremarkable. Cholecystectomy clips are noted. No free air. Small retroperitoneal nodes are reidentified, largest 5 mm in the aortocaval space for example image 33. Bladder is normal. Multiple axial sclerotic osseous lesions are reidentified.  IMPRESSION: Although colonic  bowel wall thickening has improved since the previous exam, there  has been interval worsening of ascites, pleural effusions, and subcutaneous soft tissue edema. 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  Right upper quadrant omental nodularity. Omental disease could have this appearance although postsurgical change is also possible given the presence of clips in this area.  Axial skeletal sclerotic lesions suspicious for metastatic disease.   Electronically Signed   By: Conchita Paris M.D.   On: 10/22/2013 13:04   Ir Removal Tun Access W/ Port W/o Fl Mod Sed  11/16/2013   CLINICAL DATA:  History of bacteremia, concern for seeding of patient's chronic Port a Catheter.  EXAM: REMOVAL OF IMPLANTED TUNNELED PORT-A-CATH  MEDICATIONS: The patient is currently admitted to the hospital and receiving intravenous antibiotics; The antibiotic was administered within 1 hour prior to the start of the procedure.  ANESTHESIA/SEDATION: Intravenous Versed, Fentanyl and Dilaudid was administered for conscious sedation  Sedation time: 20  FLUOROSCOPY TIME:  None  PROCEDURE: Informed written consent was obtained from the patient after a discussion of the risk, benefits and alternatives to the procedure. The patient was positioned supine on the fluoroscopy table and the right chest Port-A-Cath site was prepped with chlorhexidine. A sterile gown and gloves were worn during the procedure. Local anesthesia was provided with 1% lidocaine with epinephrine. A timeout was performed prior to the initiation of the procedure.  An incision was made overlying the Port-A-Cath with a #15 scalpel. Utilizing sharp and blunt dissection, the Port-A-Cath was removed completely. The pocked was irrigated with sterile saline. Wound closure was performed with subcutaneous 3-0 Monocryl, subcuticular 4-0 Vicryl and Dermabond. A dressing was placed. The patient tolerated the procedure well without immediate  post procedural complication.  FINDINGS: Successful removal of implant Port-A-Cath without immediate post procedural complication.  Visual inspection of the Port a catheter pocket was negative for any definitive evidence of infection and as such, the incision was closed primarily with absorbable suture as above.  IMPRESSION: Successful removal of implanted Port-A-Cath.   Electronically Signed   By: Sandi Mariscal M.D.   On: 11/16/2013 16:57   Dg Abd Acute W/chest  11/06/2013   CLINICAL DATA:  Fever and emesis ; lymphoma  EXAM: ACUTE ABDOMEN SERIES (ABDOMEN 2 VIEW & CHEST 1 VIEW)  COMPARISON:  Chest radiograph Oct 16, 2013; CT abdomen and pelvis Oct 22, 2013  FINDINGS: PA chest: There is no edema or consolidation. Heart size and pulmonary vascularity are normal. No adenopathy. Port-A-Cath tip is at the cavoatrial junction. No pneumothorax.  Supine and upright abdomen: There remains thickening of scattered bowel loops. There are scattered air-fluid levels. There is no well-defined obstruction or free air. There is moderate stool in the colon. There are surgical clips in the right upper quadrant.  IMPRESSION: There remains some wall thickening of several loops of bowel which given the history, may have neoplastic etiology. Scattered air-fluid levels may represent ileus or enteritis. Early obstruction is a consideration, although enteritis or ileus with tend to be more likely given the overall appearance of the bowel. No free air is seen.  There is no lung edema or consolidation.   Electronically Signed   By: Lowella Grip M.D.   On: 11/06/2013 11:22         Subjective: Patient continues to complain of leg pain with ambulation. He also has  lumbar back pain. Denies any fevers, chills,  chest pain, shortness breath, vomiting, diarrhea, hemoptysis, hematochezia, melena. Stools firming up.  Objective: Filed Vitals:   11/18/13 1430 11/18/13 2240 11/19/13 0451 11/19/13 1500  BP: 132/71 111/60 123/66 107/54    Pulse: 90 84 87 94  Temp: 98.8 F (37.1 C) 98.7 F (37.1 C) 98.6 F (37 C) 99.9 F (37.7 C)  TempSrc: Oral Oral Oral Oral  Resp: 22 16 21 18   Height:      Weight:      SpO2: 95% 97% 96% 97%    Intake/Output Summary (Last 24 hours) at 11/19/13 1721 Last data filed at 11/19/13 1626  Gross per 24 hour  Intake      0 ml  Output   5775 ml  Net  -5775 ml   Weight change:  Exam:   General:  Pt is alert, follows commands appropriately, not in acute distress  HEENT: No icterus, No thrush,  [place]/AT  Cardiovascular: RRR, S1/S2, no rubs, no gallops  Respiratory: Bibasilar crackles. No wheezing. Good air movement.  Abdomen: Soft/+BS, non tender, non distended, no guarding  Extremities: 3+LE edema, No lymphangitis, No petechiae, No rashes, no synovitis  Data Reviewed: Basic Metabolic Panel:  Recent Labs Lab 11/15/13 0510 11/16/13 0532 11/17/13 0540 11/18/13 0405 11/19/13 0418  NA 137 140 138 136* 138  K 3.1* 3.3* 3.5* 3.8 3.5*  CL 104 107 104 105 102  CO2 22 23 23 22 24   GLUCOSE 98 106* 118* 126* 86  BUN 9 7 6 6 6   CREATININE 0.73 0.70 0.67 0.60 0.62  CALCIUM 7.5* 7.5* 7.7* 8.0* 8.1*  MG  --  1.3* 1.7 1.6  --    Liver Function Tests: No results found for this basename: AST, ALT, ALKPHOS, BILITOT, PROT, ALBUMIN,  in the last 168 hours No results found for this basename: LIPASE, AMYLASE,  in the last 168 hours No results found for this basename: AMMONIA,  in the last 168 hours CBC:  Recent Labs Lab 11/13/13 0520 11/14/13 0350 11/15/13 0510 11/16/13 0532  WBC 2.8* 4.3 2.7* 2.1*  HGB 8.6* 9.9* 9.0* 8.2*  HCT 24.4* 28.8* 25.7* 23.6*  MCV 101.2* 102.9* 104.9* 105.4*  PLT 31* 40* 31* 37*   Cardiac Enzymes:  Recent Labs Lab 11/16/13 0532 11/17/13 1647  CKTOTAL 48 55   BNP: No components found with this basename: POCBNP,  CBG: No results found for this basename: GLUCAP,  in the last 168 hours  Recent Results (from the past 240 hour(s))  CULTURE, BLOOD  (ROUTINE X 2)     Status: None   Collection Time    11/10/13  4:35 PM      Result Value Ref Range Status   Specimen Description BLOOD LEFT HAND   Final   Special Requests BOTTLES DRAWN AEROBIC ONLY 3CC   Final   Culture  Setup Time     Final   Value: 11/10/2013 22:39     Performed at [facility]   Culture     Final   Value: ENTEROCOCCUS SPECIES     Note: SUSCEPTIBILITIES PERFORMED ON PREVIOUS CULTURE WITHIN THE LAST 5 DAYS.     Note: Gram Stain Report Called to,Read Back By and Verified With: JILL MOORE 11/11/13 @ 2:40PM BY RUSCOE A.     Performed at [facility]   Report Status 11/14/2013 FINAL   Final  CULTURE, BLOOD (ROUTINE X 2)     Status: None   Collection Time    11/10/13  4:50 PM  Result Value Ref Range Status   Specimen Description BLOOD LEFT ANTECUBITAL   Final   Special Requests BOTTLES DRAWN AEROBIC ONLY 10CC   Final   Culture  Setup Time     Final   Value: 11/10/2013 22:39     Performed at [facility]   Culture     Final   Value: ENTEROCOCCUS SPECIES     Note: Gram Stain Report Called to,Read Back By and Verified With: JILL MOORE 11/11/13 @ 4:03PM BY RUSCOE A.     Performed at [facility]   Report Status 11/14/2013 FINAL   Final   Organism ID, Bacteria ENTEROCOCCUS SPECIES   Final  AFB CULTURE, BLOOD     Status: None   Collection Time    11/11/13  9:35 AM      Result Value Ref Range Status   Specimen Description BLOOD LEFT HAND   Final   Special Requests 5CC   Final   Culture     Final   Value: CANDIDA ALBICANS     Note: CRITICAL RESULT CALLED TO, READ BACK BY AND VERIFIED WITH: BRENDA RN @ 10:11AM ON 11/15/13 BY WILSN     Performed at [facility]   Report Status PENDING   Incomplete  CLOSTRIDIUM DIFFICILE BY PCR     Status: None   Collection Time    11/11/13  4:18 PM      Result Value Ref Range Status   C difficile by pcr NEGATIVE  NEGATIVE Final  CULTURE, BLOOD (ROUTINE X 2)     Status: None   Collection Time     11/11/13  8:25 PM      Result Value Ref Range Status   Specimen Description BLOOD RIGHT ARM   Final   Special Requests BOTTLES DRAWN AEROBIC AND ANAEROBIC 5CC EACH   Final   Culture  Setup Time     Final   Value: 11/12/2013 01:05     Performed at [facility]   Culture     Final   Value: VANCOMYCIN RESISTANT ENTEROCOCCUS ISOLATED     Note: LINEZOLID TO FOLLOW CRITICAL RESULT CALLED TO, READ BACK BY AND VERIFIED WITH: BRENDA HALL 11/15/13 0815 BY SMITHERSJ DAPTOMYCIN=3 UG/ML=SENSITIVE     Note: Gram Stain Report Called to,Read Back By and Verified With: FRANCIS PLEASANT 11/12/13 @ 8:25PM BY RUSCOE A.     Performed at [facility]   Report Status PENDING   Incomplete   Organism ID, Bacteria VANCOMYCIN RESISTANT ENTEROCOCCUS ISOLATED   Final  CULTURE, BLOOD (ROUTINE X 2)     Status: None   Collection Time    11/11/13  8:35 PM      Result Value Ref Range Status   Specimen Description BLOOD LEFT HAND   Final   Special Requests BOTTLES DRAWN AEROBIC ONLY 1CC   Final   Culture  Setup Time     Final   Value: 11/12/2013 01:05     Performed at [facility]   Culture     Final   Value: NO GROWTH 5 DAYS     Performed at [facility]   Report Status 11/18/2013 FINAL   Final  URINE CULTURE     Status: None   Collection Time    11/12/13 11:22 AM      Result Value Ref Range Status   Specimen Description URINE, CLEAN CATCH   Final   Special Requests Immunocompromised   Final   Culture  Setup Time     Final   Value: 11/12/2013 23:37     Performed at [facility]     Final   Value: NO GROWTH     Performed at [facility]   Culture     Final   Value: NO GROWTH     Performed at [facility]   Report Status 11/13/2013 FINAL   Final  CULTURE, BLOOD (ROUTINE X 2)     Status: None   Collection Time    11/17/13  1:00 PM      Result Value Ref Range Status   Specimen Description BLOOD LEFT ANTECUBITAL   Final   Special Requests  BOTTLES DRAWN AEROBIC AND ANAEROBIC 10CC   Final   Culture  Setup Time     Final   Value: 11/17/2013 16:48     Performed at [facility]   Culture     Final   Value:        BLOOD CULTURE RECEIVED NO GROWTH TO DATE CULTURE WILL BE HELD FOR 5 DAYS BEFORE ISSUING A FINAL NEGATIVE REPORT     Performed at [facility]   Report Status PENDING   Incomplete  CULTURE, BLOOD (ROUTINE X 2)     Status: None   Collection Time    11/17/13  1:10 PM      Result Value Ref Range Status   Specimen Description BLOOD RIGHT HAND   Final   Special Requests BOTTLES DRAWN AEROBIC ONLY 2CC   Final   Culture  Setup Time     Final   Value: 11/17/2013 16:48     Performed at [facility]   Culture     Final   Value:        BLOOD CULTURE RECEIVED NO GROWTH TO DATE CULTURE WILL BE HELD FOR 5 DAYS BEFORE ISSUING A FINAL NEGATIVE REPORT     Performed at [facility]   Report Status PENDING   Incomplete     Scheduled Meds: . atovaquone  1,500 mg Oral Daily  . azithromycin  1,200 mg Oral Weekly  . cyclobenzaprine  5 mg Oral QHS  . DAPTOmycin (CUBICIN)  IV  750 mg Intravenous Q24H  . Darunavir Ethanolate  800 mg Oral Q breakfast  . diphenoxylate-atropine  2 tablet Oral TID  . emtricitabine-tenofovir  1 tablet Oral QHS  . feeding supplement (RESOURCE BREEZE)  1 Container Oral TID BM  . furosemide  40 mg Oral Daily  . micafungin [facility]) IV  100 mg Intravenous Daily  . morphine  30 mg Oral BID  . multivitamin with minerals  1 tablet Oral Daily  . nystatin  5 mL Oral QID  . pantoprazole  40 mg Oral BID  . phytonadione  10 mg Oral Daily  . ritonavir  100 mg Oral Q breakfast  . [START ON 11/20/2013] spironolactone  100 mg Oral Daily  . sucralfate  1 g Oral TID WC   Continuous Infusions:    Morrie Daywalt, DO  [facility] Pager [phone]  If 7PM-7AM, please contact night-coverage [email or web address] Password [email or web address] 11/19/2013, 5:21 PM   LOS: 13 days

## 2013-11-19 NOTE — Progress Notes (Signed)
INFECTIOUS DISEASE PROGRESS NOTE  ID: Samuel Schultz is a 38 y.o. male with  Principal Problem:   Nausea and vomiting in adult Active Problems:   AIDS   Large cell lymphoma   Cirrhosis, non-alcoholic   Pancytopenia   Enteritis   Other pancytopenia   Acute kidney injury   Hypotension, unspecified   Sepsis   Hypoglycemia   Other and unspecified noninfectious gastroenteritis and colitis(558.9)   Gram-positive cocci bacteremia   NSVT (nonsustained ventricular tachycardia)   Protein-calorie malnutrition, severe   VRE bacteremia  Subjective: continued leg pain. 4 loose BM yesterday, 1 today.    Abtx:  Anti-infectives   Start     Dose/Rate Route Frequency Ordered Stop   11/16/13 1000  DAPTOmycin (CUBICIN) 750 mg in sodium chloride 0.9 % IVPB     750 mg 230 mL/hr over 30 Minutes Intravenous Every 24 hours 11/15/13 0854     11/15/13 1800  cefTRIAXone (ROCEPHIN) 2 g in dextrose 5 % 50 mL IVPB  Status:  Discontinued     2 g 100 mL/hr over 30 Minutes Intravenous Every 24 hours 11/15/13 1746 11/17/13 1159   11/15/13 1200  micafungin (MYCAMINE) 100 mg in sodium chloride 0.9 % 100 mL IVPB     100 mg 100 mL/hr over 1 Hours Intravenous Daily 11/15/13 1110     11/15/13 0845  DAPTOmycin (CUBICIN) 554.5 mg in sodium chloride 0.9 % IVPB  Status:  Discontinued     6 mg/kg  92.4 kg 222.2 mL/hr over 30 Minutes Intravenous Every 24 hours 11/15/13 0844 11/15/13 0854   11/14/13 2000  vancomycin (VANCOCIN) IVPB 1000 mg/200 mL premix  Status:  Discontinued     1,000 mg 200 mL/hr over 60 Minutes Intravenous Every 8 hours 11/14/13 1105 11/15/13 0844   11/14/13 1200  fluconazole (DIFLUCAN) IVPB 100 mg  Status:  Discontinued     100 mg 50 mL/hr over 60 Minutes Intravenous Every 24 hours 11/14/13 1101 11/15/13 1110   11/13/13 1615  ampicillin (OMNIPEN) 2 g in sodium chloride 0.9 % 50 mL IVPB  Status:  Discontinued     2 g 150 mL/hr over 20 Minutes Intravenous 6 times per day 11/13/13 1611 11/15/13  0844   11/11/13 1830  cefTRIAXone (ROCEPHIN) 2 g in dextrose 5 % 50 mL IVPB  Status:  Discontinued     2 g 100 mL/hr over 30 Minutes Intravenous Every 24 hours 11/11/13 1744 11/13/13 1611   11/11/13 1745  metroNIDAZOLE (FLAGYL) tablet 500 mg  Status:  Discontinued     500 mg Oral 3 times per day 11/11/13 1744 11/17/13 1159   11/11/13 1600  vancomycin (VANCOCIN) IVPB 1000 mg/200 mL premix  Status:  Discontinued     1,000 mg 200 mL/hr over 60 Minutes Intravenous Every 12 hours 11/11/13 1555 11/14/13 1105   11/11/13 1000  fluconazole (DIFLUCAN) IVPB 100 mg  Status:  Discontinued     100 mg 50 mL/hr over 60 Minutes Intravenous Every 24 hours 11/11/13 0907 11/14/13 1101   11/11/13 0815  metroNIDAZOLE (FLAGYL) IVPB 500 mg  Status:  Discontinued     500 mg 100 mL/hr over 60 Minutes Intravenous Every 8 hours 11/11/13 0801 11/11/13 1144   11/10/13 2145  piperacillin-tazobactam (ZOSYN) IVPB 3.375 g  Status:  Discontinued     3.375 g 12.5 mL/hr over 240 Minutes Intravenous Every 8 hours 11/10/13 2133 11/11/13 1744   11/10/13 1000  azithromycin (ZITHROMAX) tablet 1,200 mg     1,200 mg Oral Weekly  11/07/13 1141     11/10/13 0800  Darunavir Ethanolate (PREZISTA) tablet 800 mg     800 mg Oral Daily with breakfast 11/09/13 1320     11/07/13 1150  sulfamethoxazole-trimethoprim (BACTRIM DS) 800-160 MG per tablet 1 tablet  Status:  Discontinued     1 tablet Oral Daily 11/07/13 1150 11/08/13 1729   11/07/13 0800  ritonavir (NORVIR) tablet 100 mg     100 mg Oral Daily with breakfast 11/06/13 1701     11/06/13 2200  emtricitabine-tenofovir (TRUVADA) 200-300 MG per tablet 1 tablet     1 tablet Oral Daily at bedtime 11/06/13 1701     11/06/13 1715  atovaquone (MEPRON) 750 MG/5ML suspension 1,500 mg     1,500 mg Oral Daily 11/06/13 1701     11/06/13 1715  Darunavir Ethanolate (PREZISTA) tablet 800 mg  Status:  Discontinued     800 mg Oral Daily 11/06/13 1701 11/09/13 1320   11/06/13 1715  metroNIDAZOLE  (FLAGYL) IVPB 500 mg  Status:  Discontinued     500 mg 100 mL/hr over 60 Minutes Intravenous Every 8 hours 11/06/13 1710 11/10/13 1511      Medications:  Scheduled: . atovaquone  1,500 mg Oral Daily  . azithromycin  1,200 mg Oral Weekly  . DAPTOmycin (CUBICIN)  IV  750 mg Intravenous Q24H  . Darunavir Ethanolate  800 mg Oral Q breakfast  . diphenoxylate-atropine  2 tablet Oral TID  . emtricitabine-tenofovir  1 tablet Oral QHS  . feeding supplement (RESOURCE BREEZE)  1 Container Oral TID BM  . furosemide  40 mg Oral Daily  . micafungin [facility]) IV  100 mg Intravenous Daily  . morphine  15 mg Oral BID  . multivitamin with minerals  1 tablet Oral Daily  . nystatin  5 mL Oral QID  . pantoprazole  40 mg Oral BID  . phytonadione  10 mg Oral Daily  . ritonavir  100 mg Oral Q breakfast  . spironolactone  50 mg Oral Daily  . sucralfate  1 g Oral TID WC    Objective: Vital signs in last 24 hours: Temp:  [98.6 F (37 C)-98.8 F (37.1 C)] 98.6 F (37 C) (06/14 0451) Pulse Rate:  [84-90] 87 (06/14 0451) Resp:  [16-22] 21 (06/14 0451) BP: (111-132)/(60-71) 123/66 mmHg (06/14 0451) SpO2:  [95 %-97 %] 96 % (06/14 0451)   General appearance: alert, cooperative and moderate distress Resp: clear to auscultation bilaterally Cardio: regular rate and rhythm GI: normal findings: bowel sounds normal and abnormal findings:  distended and diffuse tenderness.  Extremities: edema anasarca, large echymotic area on R calf.   Lab Results  Recent Labs  11/18/13 0405 11/19/13 0418  NA 136* 138  K 3.8 3.5*  CL 105 102  CO2 22 24  BUN 6 6  CREATININE 0.60 0.62   Liver Panel No results found for this basename: PROT, ALBUMIN, AST, ALT, ALKPHOS, BILITOT, BILIDIR, IBILI,  in the last 72 hours Sedimentation Rate No results found for this basename: ESRSEDRATE,  in the last 72 hours C-Reactive Protein No results found for this basename: CRP,  in the last 72 hours  Microbiology: Recent  Results (from the past 240 hour(s))  CULTURE, BLOOD (ROUTINE X 2)     Status: None   Collection Time    11/10/13  4:35 PM      Result Value Ref Range Status   Specimen Description BLOOD LEFT HAND   Final   Special Requests BOTTLES DRAWN AEROBIC ONLY  [facility]   Final   Culture  Setup Time     Final   Value: 11/10/2013 22:39     Performed at [facility]   Culture     Final   Value: ENTEROCOCCUS SPECIES     Note: SUSCEPTIBILITIES PERFORMED ON PREVIOUS CULTURE WITHIN THE LAST 5 DAYS.     Note: Gram Stain Report Called to,Read Back By and Verified With: JILL MOORE 11/11/13 @ 2:40PM BY RUSCOE A.     Performed at [facility]   Report Status 11/14/2013 FINAL   Final  CULTURE, BLOOD (ROUTINE X 2)     Status: None   Collection Time    11/10/13  4:50 PM      Result Value Ref Range Status   Specimen Description BLOOD LEFT ANTECUBITAL   Final   Special Requests BOTTLES DRAWN AEROBIC ONLY 10CC   Final   Culture  Setup Time     Final   Value: 11/10/2013 22:39     Performed at [facility]   Culture     Final   Value: ENTEROCOCCUS SPECIES     Note: Gram Stain Report Called to,Read Back By and Verified With: JILL MOORE 11/11/13 @ 4:03PM BY RUSCOE A.     Performed at [facility]   Report Status 11/14/2013 FINAL   Final   Organism ID, Bacteria ENTEROCOCCUS SPECIES   Final  AFB CULTURE, BLOOD     Status: None   Collection Time    11/11/13  9:35 AM      Result Value Ref Range Status   Specimen Description BLOOD LEFT HAND   Final   Special Requests 5CC   Final   Culture     Final   Value: YEAST ISOLATED;ID TO FOLLOW     Note: CRITICAL RESULT CALLED TO, READ BACK BY AND VERIFIED WITH: BRENDA RN @ 10:11AM ON 11/15/13 BY WILSN     Performed at [facility]   Report Status PENDING   Incomplete  CLOSTRIDIUM DIFFICILE BY PCR     Status: None   Collection Time    11/11/13  4:18 PM      Result Value Ref Range Status   C difficile by pcr NEGATIVE  NEGATIVE Final    CULTURE, BLOOD (ROUTINE X 2)     Status: None   Collection Time    11/11/13  8:25 PM      Result Value Ref Range Status   Specimen Description BLOOD RIGHT ARM   Final   Special Requests BOTTLES DRAWN AEROBIC AND ANAEROBIC 5CC EACH   Final   Culture  Setup Time     Final   Value: 11/12/2013 01:05     Performed at [facility]   Culture     Final   Value: VANCOMYCIN RESISTANT ENTEROCOCCUS ISOLATED     Note: LINEZOLID TO FOLLOW CRITICAL RESULT CALLED TO, READ BACK BY AND VERIFIED WITH: BRENDA HALL 11/15/13 0815 BY SMITHERSJ DAPTOMYCIN=3 UG/ML=SENSITIVE     Note: Gram Stain Report Called to,Read Back By and Verified With: FRANCIS PLEASANT 11/12/13 @ 8:25PM BY RUSCOE A.     Performed at [facility]   Report Status PENDING   Incomplete   Organism ID, Bacteria VANCOMYCIN RESISTANT ENTEROCOCCUS ISOLATED   Final  CULTURE, BLOOD (ROUTINE X 2)     Status: None   Collection Time    11/11/13  8:35 PM      Result Value Ref Range Status  Specimen Description BLOOD LEFT HAND   Final   Special Requests BOTTLES DRAWN AEROBIC ONLY 1CC   Final   Culture  Setup Time     Final   Value: 11/12/2013 01:05     Performed at [facility]   Culture     Final   Value: NO GROWTH 5 DAYS     Performed at [facility]   Report Status 11/18/2013 FINAL   Final  URINE CULTURE     Status: None   Collection Time    11/12/13 11:22 AM      Result Value Ref Range Status   Specimen Description URINE, CLEAN CATCH   Final   Special Requests Immunocompromised   Final   Culture  Setup Time     Final   Value: 11/12/2013 23:37     Performed at [place]     Final   Value: NO GROWTH     Performed at [facility]   Culture     Final   Value: NO GROWTH     Performed at [facility]   Report Status 11/13/2013 FINAL   Final  CULTURE, BLOOD (ROUTINE X 2)     Status: None   Collection Time    11/17/13  1:00 PM      Result Value Ref Range Status    Specimen Description BLOOD LEFT ANTECUBITAL   Final   Special Requests BOTTLES DRAWN AEROBIC AND ANAEROBIC 10CC   Final   Culture  Setup Time     Final   Value: 11/17/2013 16:48     Performed at [facility]   Culture     Final   Value:        BLOOD CULTURE RECEIVED NO GROWTH TO DATE CULTURE WILL BE HELD FOR 5 DAYS BEFORE ISSUING A FINAL NEGATIVE REPORT     Performed at [facility]   Report Status PENDING   Incomplete  CULTURE, BLOOD (ROUTINE X 2)     Status: None   Collection Time    11/17/13  1:10 PM      Result Value Ref Range Status   Specimen Description BLOOD RIGHT HAND   Final   Special Requests BOTTLES DRAWN AEROBIC ONLY 2CC   Final   Culture  Setup Time     Final   Value: 11/17/2013 16:48     Performed at [facility]   Culture     Final   Value:        BLOOD CULTURE RECEIVED NO GROWTH TO DATE CULTURE WILL BE HELD FOR 5 DAYS BEFORE ISSUING A FINAL NEGATIVE REPORT     Performed at [facility]   Report Status PENDING   Incomplete    Studies/Results: No results found.   Assessment/Plan: AIDS (CD4 20)  Lymphoma- last Chemo 2011  Pancytopenia  VRE bacteremia, Enterococcal bacteremia  Fungemia (Crypto Ag-)  N/V, diarrhea/colitis (CMV PCR -)  Anasarca  Total days of antibiotics: 4 micafungin/dapto  Darunavir/norvir/truvada  Await speciation of fungus  Repeat BCx are (-) F/u abd exam Consider MRI of his LE Consider onc eval in AM Certainly his dapto could be causing the muscle pain, could change to zyvox. This would not help his cytopenias.         Bobby Rumpf Infectious Diseases (pager) [phone] [email or web address] 11/19/2013, 10:22 AM  LOS: 13 days   **Disclaimer: This note may have been dictated with voice recognition software. Similar  sounding words can inadvertently be transcribed and this note may contain transcription errors which may not have been corrected upon publication of note.**

## 2013-11-20 ENCOUNTER — Telehealth: Payer: Self-pay | Admitting: Hematology and Oncology

## 2013-11-20 LAB — COMPREHENSIVE METABOLIC PANEL
ALT: 16 U/L (ref 0–53)
AST: 29 U/L (ref 0–37)
Albumin: 2 g/dL — ABNORMAL LOW (ref 3.5–5.2)
Alkaline Phosphatase: 69 U/L (ref 39–117)
BILIRUBIN TOTAL: 2.3 mg/dL — AB (ref 0.3–1.2)
BUN: 7 mg/dL (ref 6–23)
CHLORIDE: 104 meq/L (ref 96–112)
CO2: 26 mEq/L (ref 19–32)
Calcium: 8.2 mg/dL — ABNORMAL LOW (ref 8.4–10.5)
Creatinine, Ser: 0.64 mg/dL (ref 0.50–1.35)
GFR calc Af Amer: >90 mL/min (ref >90)
Glucose, Bld: 131 mg/dL — ABNORMAL HIGH (ref 70–99)
Potassium: 3.8 mEq/L (ref 3.7–5.3)
SODIUM: 138 meq/L (ref 137–147)
TOTAL PROTEIN: 5.9 g/dL — AB (ref 6.0–8.3)

## 2013-11-20 LAB — PROTIME-INR
INR: 1.98 — AB (ref 0.00–1.49)
Prothrombin Time: 21.9 seconds — ABNORMAL HIGH (ref 11.6–15.2)

## 2013-11-20 MED ORDER — FUROSEMIDE 40 MG PO TABS: 40.0000 mg | ORAL_TABLET | Freq: Every day | ORAL | Status: DC

## 2013-11-20 MED ORDER — SPIRONOLACTONE 100 MG PO TABS: 100.0000 mg | ORAL_TABLET | Freq: Every day | ORAL | Status: DC

## 2013-11-20 MED ORDER — BOOST / RESOURCE BREEZE PO LIQD: 1.0000 | Freq: Three times a day (TID) | ORAL | Status: DC

## 2013-11-20 MED ORDER — FLUCONAZOLE IN SODIUM CHLORIDE 400-0.9 MG/200ML-% IV SOLN
400.0000 mg | INTRAVENOUS | Status: DC
  Administered 2013-11-22 – 2013-11-25 (×4): 400 mg via INTRAVENOUS
  Filled 2013-11-20 (×5): qty 200

## 2013-11-20 MED ORDER — SODIUM CHLORIDE 0.9 % IJ SOLN
10.0000 mL | INTRAMUSCULAR | Status: DC | PRN
  Administered 2013-11-24 – 2013-11-25 (×3): 10 mL

## 2013-11-20 MED ORDER — FLUCONAZOLE IN SODIUM CHLORIDE 400-0.9 MG/200ML-% IV SOLN: 400.0000 mg | INTRAVENOUS | Status: DC

## 2013-11-20 MED ORDER — FUROSEMIDE 10 MG/ML IJ SOLN
20.0000 mg | Freq: Once | INTRAMUSCULAR | Status: AC
  Administered 2013-11-20: 20 mg via INTRAVENOUS
  Filled 2013-11-20: qty 2

## 2013-11-20 MED ORDER — PHYTONADIONE 5 MG PO TABS: 10.0000 mg | ORAL_TABLET | Freq: Every day | ORAL | Status: DC

## 2013-11-20 MED ORDER — CYCLOBENZAPRINE HCL 5 MG PO TABS: 5.0000 mg | ORAL_TABLET | Freq: Every day | ORAL | Status: DC

## 2013-11-20 MED ORDER — DIPHENOXYLATE-ATROPINE 2.5-0.025 MG PO TABS: 2.0000 | ORAL_TABLET | Freq: Three times a day (TID) | ORAL | Status: DC

## 2013-11-20 MED ORDER — AZITHROMYCIN 600 MG PO TABS: 1200.0000 mg | ORAL_TABLET | ORAL | Status: DC

## 2013-11-20 MED ORDER — FLUCONAZOLE IN SODIUM CHLORIDE 400-0.9 MG/200ML-% IV SOLN
800.0000 mg | Freq: Once | INTRAVENOUS | Status: AC
  Administered 2013-11-21: 800 mg via INTRAVENOUS
  Filled 2013-11-20 (×2): qty 400

## 2013-11-20 MED ORDER — MORPHINE SULFATE ER 30 MG PO TBCR: 30.0000 mg | EXTENDED_RELEASE_TABLET | Freq: Two times a day (BID) | ORAL | Status: DC

## 2013-11-20 MED ORDER — SODIUM CHLORIDE 0.9 % IV SOLN: 750.0000 mg | INTRAVENOUS | Status: DC

## 2013-11-20 NOTE — Progress Notes (Signed)
Peripherally Inserted Central Catheter/Midline Placement  The IV Nurse has discussed with the patient and/or persons authorized to consent for the patient, the purpose of this procedure and the potential benefits and risks involved with this procedure.  The benefits include less needle sticks, lab draws from the catheter and patient may be discharged home with the catheter.  Risks include, but not limited to, infection, bleeding, blood clot (thrombus formation), and puncture of an artery; nerve damage and irregular heat beat.  Alternatives to this procedure were also discussed.  PICC/Midline Placement Documentation  PICC / Midline Single Lumen 84/53/64 PICC Right Basilic 43 cm 1 cm (Active)       Samuel Schultz L 11/20/2013, 9:11 PM

## 2013-11-20 NOTE — Progress Notes (Signed)
PROGRESS NOTE  Tarun Patchell JHE:[id number] DOB: [birth year] DOA: 11/06/2013 PCP: PROVIDER NOT IN SYSTEM     Brief history  38 year old male with past medical history of HIV/AIDS on HAART (last CD4 10/18/2013 20), large cell lymphoma with liver infiltration (treawted with [facility] in 2010-2012 (followed at [facility]), recent admission for colitis (transferred to [facility] on 10/22/2013) where he had full w/up including negative stool studies, cx , negative colonoscopy and a bone marrow bx showing hypercellular marrow without finidings of lymphoma or leukemia. He showed improvement in symptoms there an was discharged off abx on 5/22. He then presented to [facility] ED 11/06/2013 with ongoing fatigue, weakness, diarrhea. He was transferred to [facility] for further care per GI (Dr. Dudley Major in [facility]) recommendations as he thought that the pt requires higher level of care.  Pt was started on flagyl for treatment of possible enteritis. CT abd done showed wall thickening throughout colon. The patient continues to have abdominal pain and diarrhea. Extensive workup for the diarrhea has been negative from an infectious disease standpoint. He has been treated symptomatically with opioids and anti-motility agents. His hospital stay has been complicated and prolonged by enterococcal bacteremia and fungemia. The patient has been started on daptomycin and micafungin with infectious disease following. Because of his persistent bacteremia and fungemia, his Port-A-Cath was removed on 11/16/2013 after some struggles with the patient's coagulopathy. The patient was started on chronic vitamin K replacement for his coagulopathy. His surveillance blood cultures are negative to date. The patient has developed worsening anasarca, and his diuretic dosing has been increased. Ultimate disposition is to SNF when stable and cleared by ID.  Assessment/Plan:  Persistent colitis  -possibly related to AIDS given recurrent symptoms for over 4 weeks and neg w/u  -  repeat stool for Neg CMV PCR, C.diff , stool culture, ova and parasites negative. Diarrhea has somewhat improved on Lomotil but still has abdominal pain.  -Abdominal pain partly due to anasarca -Flagyl discontinued on 6/5 but was restarted even worsened colitis seen on repeat CT scan. Flagyl ultimately discontinued on 11/17/2013  -Patient continues to have diarrhea although frequency had reduced and stool is more formed  -Advance diet -GI consulted. 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 Alen Blew regarding coagulopathy--stated INR around 2.0 may be as good as it gets--recommends concurrent FFP infusion during port-a-cath removal  -11/16/2013--Port-A-Cath removed  -11/17/2013--repeat blood cultures--remains negative  -Initially refused procedure on 6/11 but ultimately consented  VRE bacteremia/Enteroccocus bacteremia  -Etiology do to translocation of bacteria across GI tract from colitis  -Discontinued vancomycin and ampicillin  -Started Cubicin secondary to the patient's thrombocytopenia  -Continue vancomycin for 14 days be on the last negative culture (11/17/12) -Repeat CT scan of the abdomen showed worsening colitis.  -Patient has a right-sided Port-A-Cath for chemotherapy placed few years back .  - TEE on 6/8 which was negative for vegetation or thrombus. 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  -pt received 8 units FFP total for port-a-cath removal  Back pain  -MRI back--neg for spondyldiskitis  -start flexeril--back pain slowly improving -increase MS Contin to 30mg  q12h-->pain improving  HIV/AIDS on HAART  - appreciate ID consult  and recommendations  - pt is on ART . CD4 count of 20  - also on azithromycin and bactrim for PCP and MAC prophylaxis .monitor Qtc. -Continue mepron  Leg edema/Anasarca/Scrotal Edema  -venous duplex--neg for DVT  -ck 48  -likely due to hypoalbuminemia/cirrhosis-->anasarca  -Previous UA is negative for proteinuria  -No fever or SIRS  -increase furosemide and aldactone  -check adolase  Large cell lymphoma/Sclerotic bone Lumbosacral spine  - Was previously treated with chemotherapy. Reports that he is in remission. 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  -Bone marrow bx done at [facility] recently negative for lymphoma or MAI  -Sclerosis in vertebrae not typical for lymphoma--may be due to previous intrathecal chemo/XRT  -previously saw heme/onc @[facility] but prior to "Care Everywhere"  NSVT on 6/7  low magnesium corrected. EKG within normal limits. No further episodes.  Pancytopenia  - likely bone marrow suppression from HIV/AIDS and liver cirrhosis . No lymphoma seen on recent bone marrow bx done at [facility].  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 Has thrombocytopenia and coagulopathy.  -start daily vitamin K  Depression  Continue Lexapro.  Liver cirrhosis  Resumed Aldactone and Lasix.  AKI  Secondary to dehydration. Resolved.  Oral thrush  -On micafungin  Family Communication: Pt at beside  Disposition Plan: SNF when medically stable     Procedures/Studies: Dg Abd 1 View  11/12/2013   CLINICAL DATA:  Colitis.  EXAM: ABDOMEN - 1 VIEW  COMPARISON:  CT of the abdomen and pelvis 11/10/2013.  FINDINGS: Oral contrast material is noted throughout the colon and distal rectum. No pathologic distention of small bowel. No pneumoperitoneum. Multiple surgical clips project over the right upper quadrant of the abdomen. Splenic contour appears enlarged.  IMPRESSION: 1. Nonobstructive bowel gas pattern. 2. No pneumoperitoneum. 3. Splenomegaly.    Electronically Signed   By: Vinnie Langton M.D.   On: 11/12/2013 11:47   Ct Head Wo Contrast  11/10/2013   CLINICAL DATA:  Persistent abdominal pain.  Dizziness.  Headache.  EXAM: CT HEAD WITHOUT CONTRAST  TECHNIQUE: Contiguous axial images were obtained from the base of the skull through the vertex without intravenous contrast.  COMPARISON:  No priors.  FINDINGS: No acute intracranial abnormalities. Specifically, no evidence of acute intracranial hemorrhage, no definite findings of acute/subacute cerebral ischemia, no mass, mass effect, hydrocephalus or abnormal intra or extra-axial fluid collections. Visualized paranasal sinuses and mastoids are well pneumatized. No acute displaced skull fractures are identified.  IMPRESSION: *No acute intracranial abnormalities. *The appearance of the brain is normal.   Electronically Signed   By: Vinnie Langton M.D.   On: 11/10/2013 19:43   Mr Lumbar Spine W Wo Contrast  11/15/2013   CLINICAL DATA:  38 year old male with bacteremia and new back pain. Severe low back pain radiating to the right lower extremity. Initial encounter. HIV, cirrhosis, lymphoma.  EXAM: MRI LUMBAR SPINE WITHOUT AND WITH CONTRAST  TECHNIQUE: Multiplanar and multiecho pulse sequences of the lumbar spine were obtained without and with intravenous contrast.  CONTRAST:  43mL MULTIHANCE GADOBENATE DIMEGLUMINE 529 MG/ML IV SOLN  COMPARISON:  CT Abdomen and Pelvis 11/10/2013 and earlier.  FINDINGS: Normal lumbar segmentation depicted on comparison.  Diffuse loss of normal bone marrow signal in the visible spine, and left iliac bone. However, no marrow edema or destructive osseous lesion identified. No abnormal vertebral enhancement in the spine.  Visualized lower thoracic spinal cord is normal with conus medularis at L1. Normal cauda equina nerve roots. No  abnormal intradural enhancement.  There is mild abnormal STIR and T2 signal in the lower right erector spinae muscles, at the L4 and L5 level (series  5, image 1 and series 6, image 26). This is nonspecific. No definite abnormal enhancement, and other visible paraspinal muscles appear normal. There is mild subcutaneous edema in the lumbar spine, significance doubtful in this setting.  Much of the abdominal viscera are obscured on these images.  T11-T12: Grossly normal.  T12-L1:  Negative.  L1-L2:  Negative.  L2-L3:  Negative.  L3-L4: Mild disc space loss and disc desiccation. Mild circumferential disc osteophyte complex. Mild facet hypertrophy. No significant stenosis.  L4-L5:  Negative except for mild facet hypertrophy.  L5-S1: Disc space loss with disc desiccation and right eccentric circumferential disc osteophyte complex. Focal central component (series 6, image 31), but no spinal or definite lateral recess stenosis. No foraminal stenosis.  IMPRESSION: 1. Diffuse loss of normal bone marrow signal, but may be post treatment affect rather than due to widespread osseous metastatic disease. No destructive osseous lesion identified. 2. No evidence of lumbar discitis osteomyelitis. Mild to moderate chronic appearing disc degeneration at L3-L4 and L5-S1. No spinal stenosis. 3. Mild signal abnormality in the right lower lumbar erector spinae musculature, nonspecific. Early infectious myositis is not excluded in this setting.   Electronically Signed   By: Lars Pinks M.D.   On: 11/15/2013 20:30   Ct Abdomen Pelvis W Contrast  11/10/2013   CLINICAL DATA:  Persistent abdominal pain.  EXAM: CT ABDOMEN AND PELVIS WITH CONTRAST  TECHNIQUE: Multidetector CT imaging of the abdomen and pelvis was performed using the standard protocol following bolus administration of intravenous contrast.  CONTRAST:  118mL OMNIPAQUE IOHEXOL 300 MG/ML  SOLN  COMPARISON:  CT of the abdomen and pelvis 10/22/2013.  FINDINGS: Lung Bases: Linear opacities throughout the lung bases bilaterally, predominantly in a peribronchovascular distribution, favored to represent areas of peribronchial  inflammation, potentially related to recent recurrent bouts of aspiration. Markedly thickened distal esophagus. Numerous serpiginous structures adjacent to the distal esophagus, presumably esophageal varices. Tip of a porta cath in the right atrium.  Abdomen/Pelvis: The liver has a shrunken appearance and nodular contour, compatible with advanced cirrhosis. No definite focal hepatic lesions are noted on today's limited noncontrast CT examination. Numerous surgical clips are noted adjacent to the liver. Status post cholecystectomy. Potential stone or migrated surgical clip in the proximal aspect of the common bile duct demonstrated on images 19 and 20 of series 2, unchanged. The portal vein is dilated measuring 18 mm. The spleen is massively enlarged measuring 18.6 x 14.1 x 17.7 cm (estimated splenic volume of 2,321 mL).  New compared to the recent prior examination there is marked thickening of the colonic wall involving the cecum and ascending colon. These findings are concerning for colitis. More distal aspect of the colon appears uninvolved. No significant volume of ascites. No pneumoperitoneum. No pathologic distention of small bowel. No definite lymphadenopathy identified within the abdomen or pelvis on today's non contrast CT examination. Prostate gland and urinary bladder are unremarkable in appearance. Small umbilical hernia containing only omental fat incidentally noted (unchanged).  Musculoskeletal: Multifocal sclerotic lesions are noted throughout the visualized axial and appendicular skeleton, most apparent within the sacrum at the level of S1, in the right side of the L5 vertebral body, and scattered throughout the remainder of the spine. These findings are similar to prior examinations. At T10 and T11 there is mild compression which is unchanged, with approximately 15-20% loss  of height anteriorly at both vertebral body levels. Edema throughout the subcutaneous fat of the flanks bilaterally (right  greater than left).  IMPRESSION: 1. Interval development of extensive colonic wall thickening involving the cecum and ascending colon, concerning for colitis. Clinical correlation is recommended. 2. Stigmata of cirrhosis and portal hypertension, including massive splenomegaly and esophageal varices redemonstrated, as above. 3. The appearance of the lung bases suggest sequela of multiple recent bouts of aspiration. 4. Small umbilical hernia containing only omental fat incidentally noted. 5. 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 6. Additional incidental findings, as above.   Electronically Signed   By: Vinnie Langton M.D.   On: 11/10/2013 19:56   Ct Abdomen Pelvis W Contrast  11/07/2013   CLINICAL DATA:  Abdominal pain  EXAM: CT ABDOMEN AND PELVIS WITH CONTRAST  TECHNIQUE: Multidetector CT imaging of the abdomen and pelvis was performed using the standard protocol following bolus administration of intravenous contrast.  CONTRAST:  58mL OMNIPAQUE IOHEXOL 300 MG/ML  SOLN  COMPARISON:  10/22/2013  FINDINGS: Cirrhotic liver.  Portal vein is grossly patent.  Bibasilar atelectasis.  Splenomegaly.  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  Right pleural effusion and abdominal ascites have resolved. Trace amount of fluid is layering in the pelvis.  Pancreas, kidneys, adrenal glands are within normal limits.  Umbilical hernia containing adipose tissue is stable.  There are areas of wall thickening in the colon. This occurs in the ascending colon on image 53, transverse colon on image 39, and possibly within the sigmoid colon, although the sigmoid colon is decompressed.  Bladder and prostate are unremarkable.  Sclerotic changes within thoracic and lumbar vertebral bodies is not significantly changed. This may be associated with the patient's known history of lymphoma. Lytic areas in the iliac bones  are also noted.  IMPRESSION: Cirrhotic liver.  Stable splenomegaly.  There are areas of wall thickening throughout the colon. Differential diagnosis includes an inflammatory process or malignancy.  Pleural effusions and abdominal ascites have resolved. Trace fluid persist in the pelvis.   Electronically Signed   By: Maryclare Bean M.D.   On: 11/07/2013 16:11   Ct Abdomen Pelvis W Contrast  10/22/2013   CLINICAL DATA:  Lymphoma, prior cholecystectomy and appendectomy. Status post bone marrow transplant. Abdominal pain and lactic acidosis.  EXAM: CT ABDOMEN AND PELVIS WITH CONTRAST  TECHNIQUE: Multidetector CT imaging of the abdomen and pelvis was performed using the standard protocol following bolus administration of intravenous contrast.  CONTRAST:  176mL OMNIPAQUE IOHEXOL 300 MG/ML  SOLN  COMPARISON:  CT ABD - PELV W/ CM dated 10/16/2013  FINDINGS: Trace pleural effusions are increased since previously, with bibasilar dependent atelectasis.  Splenomegaly is reidentified. Nodular hepatic contour reidentified with abdominal ascites in all 4 quadrants. Interval increase in soft tissue edema compatible with third spacing. Mild irregularity of the omental fat adjacent to the anterior segment right hepatic lobe with adjacent surgical clips noted, image 14. Upper abdominal vascular collaterals likely indicate portal hypertension. Portal vein not visualized, which may indicate chronic thrombosis.  Fat containing umbilical hernia noted. Foci of gas within the anterior abdominal wall subcutaneous fat likely indicating injection locations. Improvement in colonic wall thickening is identified. Adrenal glands, spleen, and kidneys are unremarkable. Cholecystectomy clips are noted. No free air. Small retroperitoneal nodes are reidentified, largest 5 mm in the aortocaval space for example image 33. Bladder is normal. Multiple axial sclerotic osseous lesions are  reidentified.  IMPRESSION: Although colonic bowel wall thickening has  improved since the previous exam, there has been interval worsening of ascites, pleural effusions, and subcutaneous soft tissue edema. 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  Right upper quadrant omental nodularity. Omental disease could have this appearance although postsurgical change is also possible given the presence of clips in this area.  Axial skeletal sclerotic lesions suspicious for metastatic disease.   Electronically Signed   By: Conchita Paris M.D.   On: 10/22/2013 13:04   Ir Removal Tun Access W/ Port W/o Fl Mod Sed  11/16/2013   CLINICAL DATA:  History of bacteremia, concern for seeding of patient's chronic Port a Catheter.  EXAM: REMOVAL OF IMPLANTED TUNNELED PORT-A-CATH  MEDICATIONS: The patient is currently admitted to the hospital and receiving intravenous antibiotics; The antibiotic was administered within 1 hour prior to the start of the procedure.  ANESTHESIA/SEDATION: Intravenous Versed, Fentanyl and Dilaudid was administered for conscious sedation  Sedation time: 20  FLUOROSCOPY TIME:  None  PROCEDURE: Informed written consent was obtained from the patient after a discussion of the risk, benefits and alternatives to the procedure. The patient was positioned supine on the fluoroscopy table and the right chest Port-A-Cath site was prepped with chlorhexidine. A sterile gown and gloves were worn during the procedure. Local anesthesia was provided with 1% lidocaine with epinephrine. A timeout was performed prior to the initiation of the procedure.  An incision was made overlying the Port-A-Cath with a #15 scalpel. Utilizing sharp and blunt dissection, the Port-A-Cath was removed completely. The pocked was irrigated with sterile saline. Wound closure was performed with subcutaneous 3-0 Monocryl, subcuticular 4-0 Vicryl and Dermabond. A dressing was placed. The patient tolerated  the procedure well without immediate post procedural complication.  FINDINGS: Successful removal of implant Port-A-Cath without immediate post procedural complication.  Visual inspection of the Port a catheter pocket was negative for any definitive evidence of infection and as such, the incision was closed primarily with absorbable suture as above.  IMPRESSION: Successful removal of implanted Port-A-Cath.   Electronically Signed   By: Sandi Mariscal M.D.   On: 11/16/2013 16:57   Dg Abd Acute W/chest  11/06/2013   CLINICAL DATA:  Fever and emesis ; lymphoma  EXAM: ACUTE ABDOMEN SERIES (ABDOMEN 2 VIEW & CHEST 1 VIEW)  COMPARISON:  Chest radiograph Oct 16, 2013; CT abdomen and pelvis Oct 22, 2013  FINDINGS: PA chest: There is no edema or consolidation. Heart size and pulmonary vascularity are normal. No adenopathy. Port-A-Cath tip is at the cavoatrial junction. No pneumothorax.  Supine and upright abdomen: There remains thickening of scattered bowel loops. There are scattered air-fluid levels. There is no well-defined obstruction or free air. There is moderate stool in the colon. There are surgical clips in the right upper quadrant.  IMPRESSION: There remains some wall thickening of several loops of bowel which given the history, may have neoplastic etiology. Scattered air-fluid levels may represent ileus or enteritis. Early obstruction is a consideration, although enteritis or ileus with tend to be more likely given the overall appearance of the bowel. No free air is seen.  There is no lung edema or consolidation.   Electronically Signed   By: Lowella Grip M.D.   On: 11/06/2013 11:22         Subjective: Patient states the pain has improved in the abdomen and back adjustment of his medications.  Bowel movements are firming up. Denies any fevers, chills, chest pain, shortness breath, vomiting, dysuria, hematuria.  Objective: Filed Vitals:   11/19/13 0451 11/19/13 1500 11/19/13 2128 11/20/13 0508  BP:  123/66 107/54 115/64 120/65  Pulse: 87 94 90 84  Temp: 98.6 F (37 C) 99.9 F (37.7 C) 98.7 F (37.1 C) 98.5 F (36.9 C)  TempSrc: Oral Oral Oral   Resp: 21 18 17 17   Height:      Weight:      SpO2: 96% 97% 98% 94%    Intake/Output Summary (Last 24 hours) at 11/20/13 1350 Last data filed at 11/20/13 0904  Gross per 24 hour  Intake    360 ml  Output   2700 ml  Net  -2340 ml   Weight change:  Exam:   General:  Pt is alert, follows commands appropriately, not in acute distress  HEENT: No icterus, No thrush,  [place]/AT  Cardiovascular: RRR, S1/S2, no rubs, no gallops  Respiratory: CTA bilaterally, no wheezing, no crackles, no rhonchi  Abdomen: Soft/+BS, diffusely tender without any peritoneal signs, non distended, no guarding--anasarca  Extremities: 3+LE edema, No lymphangitis, No petechiae, No rashes, no synovitis  Data Reviewed: Basic Metabolic Panel:  Recent Labs Lab 11/16/13 0532 11/17/13 0540 11/18/13 0405 11/19/13 0418 11/20/13 0659  NA 140 138 136* 138 138  K 3.3* 3.5* 3.8 3.5* 3.8  CL 107 104 105 102 104  CO2 23 23 22 24 26   GLUCOSE 106* 118* 126* 86 131*  BUN 7 6 6 6 7   CREATININE 0.70 0.67 0.60 0.62 0.64  CALCIUM 7.5* 7.7* 8.0* 8.1* 8.2*  MG 1.3* 1.7 1.6  --   --    Liver Function Tests:  Recent Labs Lab 11/20/13 0659  AST 29  ALT 16  ALKPHOS 69  BILITOT 2.3*  PROT 5.9*  ALBUMIN 2.0*   No results found for this basename: LIPASE, AMYLASE,  in the last 168 hours No results found for this basename: AMMONIA,  in the last 168 hours CBC:  Recent Labs Lab 11/14/13 0350 11/15/13 0510 11/16/13 0532  WBC 4.3 2.7* 2.1*  HGB 9.9* 9.0* 8.2*  HCT 28.8* 25.7* 23.6*  MCV 102.9* 104.9* 105.4*  PLT 40* 31* 37*   Cardiac Enzymes:  Recent Labs Lab 11/16/13 0532 11/17/13 1647  CKTOTAL 48 55   BNP: No components found with this basename: POCBNP,  CBG: No results found for this basename: GLUCAP,  in the last 168 hours  Recent Results (from  the past 240 hour(s))  CULTURE, BLOOD (ROUTINE X 2)     Status: None   Collection Time    11/10/13  4:35 PM      Result Value Ref Range Status   Specimen Description BLOOD LEFT HAND   Final   Special Requests BOTTLES DRAWN AEROBIC ONLY 3CC   Final   Culture  Setup Time     Final   Value: 11/10/2013 22:39     Performed at [facility]   Culture     Final   Value: ENTEROCOCCUS SPECIES     Note: SUSCEPTIBILITIES PERFORMED ON PREVIOUS CULTURE WITHIN THE LAST 5 DAYS.     Note: Gram Stain Report Called to,Read Back By and Verified With: JILL MOORE 11/11/13 @ 2:40PM BY RUSCOE A.     Performed at [facility]   Report Status 11/14/2013 FINAL   Final  CULTURE, BLOOD (ROUTINE X 2)     Status: None   Collection Time  11/10/13  4:50 PM      Result Value Ref Range Status   Specimen Description BLOOD LEFT ANTECUBITAL   Final   Special Requests BOTTLES DRAWN AEROBIC ONLY 10CC   Final   Culture  Setup Time     Final   Value: 11/10/2013 22:39     Performed at [facility]   Culture     Final   Value: ENTEROCOCCUS SPECIES     Note: Gram Stain Report Called to,Read Back By and Verified With: JILL MOORE 11/11/13 @ 4:03PM BY RUSCOE A.     Performed at [facility]   Report Status 11/14/2013 FINAL   Final   Organism ID, Bacteria ENTEROCOCCUS SPECIES   Final  AFB CULTURE, BLOOD     Status: None   Collection Time    11/11/13  9:35 AM      Result Value Ref Range Status   Specimen Description BLOOD LEFT HAND   Final   Special Requests 5CC   Final   Culture     Final   Value: CANDIDA ALBICANS     Note: CRITICAL RESULT CALLED TO, READ BACK BY AND VERIFIED WITH: BRENDA RN @ 10:11AM ON 11/15/13 BY WILSN     Performed at [facility]   Report Status PENDING   Incomplete  CLOSTRIDIUM DIFFICILE BY PCR     Status: None   Collection Time    11/11/13  4:18 PM      Result Value Ref Range Status   C difficile by pcr NEGATIVE  NEGATIVE Final  CULTURE, BLOOD (ROUTINE X 2)      Status: None   Collection Time    11/11/13  8:25 PM      Result Value Ref Range Status   Specimen Description BLOOD RIGHT ARM   Final   Special Requests BOTTLES DRAWN AEROBIC AND ANAEROBIC 5CC EACH   Final   Culture  Setup Time     Final   Value: 11/12/2013 01:05     Performed at [facility]   Culture     Final   Value: VANCOMYCIN RESISTANT ENTEROCOCCUS ISOLATED     Note: LINEZOLID TO FOLLOW CRITICAL RESULT CALLED TO, READ BACK BY AND VERIFIED WITH: BRENDA HALL 11/15/13 0815 BY SMITHERSJ DAPTOMYCIN=3 UG/ML=SENSITIVE SENT TO REFERENCE LAB     Note: Gram Stain Report Called to,Read Back By and Verified With: FRANCIS PLEASANT 11/12/13 @ 8:25PM BY RUSCOE A.     Performed at [facility]   Report Status PENDING   Incomplete   Organism ID, Bacteria VANCOMYCIN RESISTANT ENTEROCOCCUS ISOLATED   Final  CULTURE, BLOOD (ROUTINE X 2)     Status: None   Collection Time    11/11/13  8:35 PM      Result Value Ref Range Status   Specimen Description BLOOD LEFT HAND   Final   Special Requests BOTTLES DRAWN AEROBIC ONLY 1CC   Final   Culture  Setup Time     Final   Value: 11/12/2013 01:05     Performed at [facility]   Culture     Final   Value: NO GROWTH 5 DAYS     Performed at [facility]   Report Status 11/18/2013 FINAL   Final  URINE CULTURE     Status: None   Collection Time    11/12/13 11:22 AM      Result Value Ref Range Status   Specimen Description URINE, CLEAN CATCH  Final   Special Requests Immunocompromised   Final   Culture  Setup Time     Final   Value: 11/12/2013 23:37     Performed at [place]     Final   Value: NO GROWTH     Performed at [facility]   Culture     Final   Value: NO GROWTH     Performed at [facility]   Report Status 11/13/2013 FINAL   Final  CULTURE, BLOOD (ROUTINE X 2)     Status: None   Collection Time    11/17/13  1:00 PM      Result Value Ref Range Status   Specimen  Description BLOOD LEFT ANTECUBITAL   Final   Special Requests BOTTLES DRAWN AEROBIC AND ANAEROBIC 10CC   Final   Culture  Setup Time     Final   Value: 11/17/2013 16:48     Performed at [facility]   Culture     Final   Value:        BLOOD CULTURE RECEIVED NO GROWTH TO DATE CULTURE WILL BE HELD FOR 5 DAYS BEFORE ISSUING A FINAL NEGATIVE REPORT     Performed at [facility]   Report Status PENDING   Incomplete  CULTURE, BLOOD (ROUTINE X 2)     Status: None   Collection Time    11/17/13  1:10 PM      Result Value Ref Range Status   Specimen Description BLOOD RIGHT HAND   Final   Special Requests BOTTLES DRAWN AEROBIC ONLY 2CC   Final   Culture  Setup Time     Final   Value: 11/17/2013 16:48     Performed at [facility]   Culture     Final   Value:        BLOOD CULTURE RECEIVED NO GROWTH TO DATE CULTURE WILL BE HELD FOR 5 DAYS BEFORE ISSUING A FINAL NEGATIVE REPORT     Performed at [facility]   Report Status PENDING   Incomplete     Scheduled Meds: . atovaquone  1,500 mg Oral Daily  . azithromycin  1,200 mg Oral Weekly  . cyclobenzaprine  5 mg Oral QHS  . DAPTOmycin (CUBICIN)  IV  750 mg Intravenous Q24H  . Darunavir Ethanolate  800 mg Oral Q breakfast  . diphenoxylate-atropine  2 tablet Oral TID  . emtricitabine-tenofovir  1 tablet Oral QHS  . feeding supplement (RESOURCE BREEZE)  1 Container Oral TID BM  . furosemide  20 mg Intravenous Once  . furosemide  40 mg Oral Daily  . micafungin [facility]) IV  100 mg Intravenous Daily  . morphine  30 mg Oral BID  . multivitamin with minerals  1 tablet Oral Daily  . nystatin  5 mL Oral QID  . pantoprazole  40 mg Oral BID  . phytonadione  10 mg Oral Daily  . ritonavir  100 mg Oral Q breakfast  . spironolactone  100 mg Oral Daily  . sucralfate  1 g Oral TID WC   Continuous Infusions:    Shernell Saldierna, DO  [facility] Pager [phone]  If 7PM-7AM, please contact  night-coverage [email or web address] Password [email or web address] 11/20/2013, 1:50 PM   LOS: 14 days

## 2013-11-20 NOTE — Discharge Summary (Signed)
Physician Discharge Summary  Samuel Schultz ZSW:[id number] DOB: 1976-04-22 DOA: 11/06/2013  PCP: PROVIDER NOT IN SYSTEM  Admit date: 11/06/2013 Discharge date: 11/22/13 Recommendations for Outpatient Follow-up:  1. Pt will need to follow up with PCP in 2 weeks post discharge 2. Please obtain BMP  3. Please also check CBC  4. Continue daptomycin through and including 11/29/13 5. Continue fluconazole through and including 11/29/13 6. Remove PICC line after last dose of antimicrobials     Discharge Condition: stable  Disposition: SNF  Diet:low sodium Wt Readings from Last 3 Encounters:  11/06/13 92.352 kg (203 lb 9.6 oz)  11/06/13 92.352 kg (203 lb 9.6 oz)  10/16/13 96.208 kg (212 lb 1.6 oz)    Discharge Diagnoses: Persistent colitis  -possibly related to AIDS given recurrent symptoms for over 4 weeks and neg w/u  - repeat stool for Neg CMV PCR, C.diff , stool culture, ova and parasites negative. Diarrhea has somewhat improved on Lomotil but still has abdominal pain.  -Abdominal pain partly due to anasarca  -Flagyl discontinued on 6/5 but was restarted even worsened colitis seen on repeat CT scan. Flagyl ultimately discontinued on 11/17/2013  -Patient continues to have diarrhea although frequency had reduced and stool is more formed  -Advance diet  -GI consulted. No further workup recommended recent negative workups including studies and CMV culture from colon biopsy.--CMV PCR neg  -Continue Lomotil for symptomatic treatment  -Colonoscopy 10/27/2013 negative  -10/16/13 stool microsporidia negative  Fungemia  -11/11/13 AFB culture grew C.albicans  -empiric echinocandin (D#6)-->switched to fluconzaole through 11/29/13 which will mark 14 days of therapy from the last negative culture -appreciate ID followup  -I spoke with hematology, Dr. Alen Blew regarding coagulopathy--stated INR around 2.0 may be as good as it gets--recommends concurrent FFP infusion during port-a-cath removal    -11/16/2013--Port-A-Cath removed  -11/17/2013--repeat blood cultures--remains negative  -Initially refused procedure on 6/11 but ultimately consented  VRE bacteremia/Enteroccocus bacteremia  -Etiology do to translocation of bacteria across GI tract from colitis  -Discontinued vancomycin and ampicillin  -Started Cubicin secondary to the patient's thrombocytopenia  -continue Cubicin through 11/29/2013 -Continue vancomycin for 14 days be on the last negative culture (11/17/12)  -Repeat CT scan of the abdomen showed worsening colitis.  -Patient has a right-sided Port-A-Cath for chemotherapy placed few years back .  - TEE on 6/8 which was negative for vegetation or thrombus. 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  -pt received 8 units FFP total for port-a-cath removal  Back pain  -MRI back--neg for spondyldiskitis  -start flexeril--back pain slowly improving  -increase MS Contin to 30mg  q12h-->pain improving  HIV/AIDS on HAART  - appreciate ID consult and recommendations  - pt is on ART . CD4 count of 20  - also on azithromycin and bactrim for PCP and MAC prophylaxis .monitor Qtc. -Continue mepron--pt has been intermittenly refusing--counseled on importance of taking it  Leg edema/Anasarca/Scrotal Edema  -venous duplex--neg for DVT  -ck 48  -likely due to hypoalbuminemia/cirrhosis-->anasarca  -Previous UA is negative for proteinuria  -No fever or SIRS  -improving with intermitten IV lasix during the hospitalization -increased po furosemide and po aldactone  -check adolase--please followup on results  -2 gram (low sodium) sodium diet Large cell lymphoma/Sclerotic bone Lumbosacral spine  - Was previously treated with chemotherapy. Reports that he is in remission. 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  -Bone marrow bx done at [facility] recently negative for lymphoma or MAI  -Sclerosis in vertebrae not typical for lymphoma--may be due to previous intrathecal chemo/XRT  -  previously saw heme/onc  @[place]  [facility] but prior to "[facility]"  NSVT on 6/7  low magnesium corrected. EKG within normal limits. No further episodes.  Pancytopenia  - likely bone marrow suppression from HIV/AIDS and liver cirrhosis . No lymphoma seen on recent bone marrow bx done at [facility].  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 Has thrombocytopenia and coagulopathy.  -started daily vitamin K  Depression  Continue Lexapro.  Liver cirrhosis  Resumed Aldactone and Lasix.  -Doses of furosemide and Aldactone have been increased due to the patient's anasarca AKI  Secondary to dehydration. Resolved.  Oral thrush  -On micafungin-->fluconzaole    Hospital Course:  38 year old male with past medical history of HIV/AIDS on HAART (last CD4 10/18/2013 20), large cell lymphoma with liver infiltration (treawted with [facility] in 2010-2012 (followed at [facility]), recent admission for colitis (transferred to [facility] on 10/22/2013) where he had full w/up including negative stool studies, cx , negative colonoscopy and a bone marrow bx showing hypercellular marrow without finidings of lymphoma or leukemia. He showed improvement in symptoms there an was discharged off abx on 5/22. He then presented to [facility] ED 11/06/2013 with ongoing fatigue, weakness, diarrhea. He was transferred to [facility] for further care per GI (Dr. Dudley Major in [facility]) recommendations as he thought that the pt requires higher level of care.  Pt was started on flagyl for treatment of possible enteritis. CT abd done showed wall thickening throughout colon. The patient continues to have abdominal pain and diarrhea. Extensive workup for the diarrhea has been negative from an infectious disease standpoint. He has been treated symptomatically with opioids and anti-motility agents. His hospital stay has been complicated and prolonged by enterococcal bacteremia and fungemia. The patient has been started on daptomycin and micafungin with infectious disease following.  Because of his persistent bacteremia and fungemia, his Port-A-Cath was removed on 11/16/2013 after some struggles with the patient's coagulopathy. The patient was started on chronic vitamin K replacement for his coagulopathy. His surveillance blood cultures obtained 11/17/2013 are negative to date. The patient has developed worsening anasarca, and his diuretic dosing has been increased. The patient will continue on Cubicin and fluconazole through 11/29/2013 which is well demarcated 14 days of antimicrobial therapy beyond the last negative culture. Ultimate disposition is to SNF when stable and cleared by ID.    Consultants: ID--[place]  Discharge Exam: Filed Vitals:   11/21/13 1307  BP: 121/60  Pulse: 92  Temp: 98.1 F (36.7 C)  Resp: 17   Filed Vitals:   11/20/13 1504 11/20/13 2211 11/21/13 0513 11/21/13 1307  BP: 124/68 116/59 105/61 121/60  Pulse: 90 88 89 92  Temp: 98.8 F (37.1 C) 99.1 F (37.3 C) 98.5 F (36.9 C) 98.1 F (36.7 C)  TempSrc: Oral Oral Oral Oral  Resp: 17 18 18 17   Height:      Weight:      SpO2: 98% 95% 95% 96%   General: A&O x 3, NAD, pleasant, cooperative Cardiovascular: RRR, no rub, no gallop, no S3 Respiratory: CTAB, no wheeze, no rhonchi Abdomen:soft, nontender, nondistended, positive bowel sounds Extremities: 2+LE edema, No lymphangitis, no petechiae  Discharge Instructions     Medication List    STOP taking these medications       polyethylene glycol packet  Commonly known as:  MIRALAX / GLYCOLAX     prazosin 1 MG capsule  Commonly known as:  MINIPRESS     senna 8.6 MG tablet  Commonly known as:  [employer]  STOOL SOFTENER 100 MG capsule  Generic drug:  docusate sodium      TAKE these medications       atovaquone 750 MG/5ML suspension  Commonly known as:  MEPRON  Take 1,500 mg by mouth daily.     azithromycin 600 MG tablet  Commonly known as:  ZITHROMAX  Take 2 tablets (1,200 mg total) by mouth once a week. Next dose on  11/24/13  Start taking on:  11/24/2013     cyclobenzaprine 5 MG tablet  Commonly known as:  FLEXERIL  Take 1 tablet (5 mg total) by mouth at bedtime.     DAPTOmycin 750 mg in sodium chloride 0.9 % 100 mL  Inject 750 mg into the vein daily.     diphenoxylate-atropine 2.5-0.025 MG per tablet  Commonly known as:  LOMOTIL  Take 2 tablets by mouth 3 (three) times daily.     emtricitabine-tenofovir 200-300 MG per tablet  Commonly known as:  TRUVADA  Take 1 tablet by mouth at bedtime.     escitalopram 20 MG tablet  Commonly known as:  LEXAPRO  Take 20 mg by mouth daily.     feeding supplement (RESOURCE BREEZE) Liqd  Take 1 Container by mouth 3 (three) times daily between meals.     fluconazole 400-0.9 MG/200ML-% IVPB  Commonly known as:  DIFLUCAN  Inject 200 mLs (400 mg total) into the vein daily.  Start taking on:  11/22/2013     furosemide 40 MG tablet  Commonly known as:  LASIX  Take 1 tablet (40 mg total) by mouth daily.     gabapentin 300 MG capsule  Commonly known as:  NEURONTIN  Take 300 mg by mouth 3 (three) times daily.     morphine 30 MG 12 hr tablet  Commonly known as:  MS CONTIN  Take 1 tablet (30 mg total) by mouth 2 (two) times daily.     pantoprazole 40 MG tablet  Commonly known as:  PROTONIX  Take 40 mg by mouth 2 (two) times daily.     phytonadione 5 MG tablet  Commonly known as:  VITAMIN K  Take 2 tablets (10 mg total) by mouth daily.     PREZISTA 800 MG tablet  Generic drug:  Darunavir Ethanolate  Take 800 mg by mouth daily.     ritonavir 100 MG capsule  Commonly known as:  NORVIR  Take 100 mg by mouth daily with breakfast.     spironolactone 100 MG tablet  Commonly known as:  ALDACTONE  Take 1 tablet (100 mg total) by mouth daily.     sucralfate 1 G tablet  Commonly known as:  CARAFATE  Take 1 g by mouth 3 (three) times daily with meals.         The results of significant diagnostics from this hospitalization (including imaging,  microbiology, ancillary and laboratory) are listed below for reference.    Significant Diagnostic Studies: Dg Abd 1 View  11/12/2013   CLINICAL DATA:  Colitis.  EXAM: ABDOMEN - 1 VIEW  COMPARISON:  CT of the abdomen and pelvis 11/10/2013.  FINDINGS: Oral contrast material is noted throughout the colon and distal rectum. No pathologic distention of small bowel. No pneumoperitoneum. Multiple surgical clips project over the right upper quadrant of the abdomen. Splenic contour appears enlarged.  IMPRESSION: 1. Nonobstructive bowel gas pattern. 2. No pneumoperitoneum. 3. Splenomegaly.   Electronically Signed   By: Vinnie Langton M.D.   On: 11/12/2013 11:47   Ct Head Wo  Contrast  11/10/2013   CLINICAL DATA:  Persistent abdominal pain.  Dizziness.  Headache.  EXAM: CT HEAD WITHOUT CONTRAST  TECHNIQUE: Contiguous axial images were obtained from the base of the skull through the vertex without intravenous contrast.  COMPARISON:  No priors.  FINDINGS: No acute intracranial abnormalities. Specifically, no evidence of acute intracranial hemorrhage, no definite findings of acute/subacute cerebral ischemia, no mass, mass effect, hydrocephalus or abnormal intra or extra-axial fluid collections. Visualized paranasal sinuses and mastoids are well pneumatized. No acute displaced skull fractures are identified.  IMPRESSION: *No acute intracranial abnormalities. *The appearance of the brain is normal.   Electronically Signed   By: Vinnie Langton M.D.   On: 11/10/2013 19:43   Mr Lumbar Spine W Wo Contrast  11/15/2013   CLINICAL DATA:  38 year old male with bacteremia and new back pain. Severe low back pain radiating to the right lower extremity. Initial encounter. HIV, cirrhosis, lymphoma.  EXAM: MRI LUMBAR SPINE WITHOUT AND WITH CONTRAST  TECHNIQUE: Multiplanar and multiecho pulse sequences of the lumbar spine were obtained without and with intravenous contrast.  CONTRAST:  66mL MULTIHANCE GADOBENATE DIMEGLUMINE 529 MG/ML  IV SOLN  COMPARISON:  CT Abdomen and Pelvis 11/10/2013 and earlier.  FINDINGS: Normal lumbar segmentation depicted on comparison.  Diffuse loss of normal bone marrow signal in the visible spine, and left iliac bone. However, no marrow edema or destructive osseous lesion identified. No abnormal vertebral enhancement in the spine.  Visualized lower thoracic spinal cord is normal with conus medularis at L1. Normal cauda equina nerve roots. No abnormal intradural enhancement.  There is mild abnormal STIR and T2 signal in the lower right erector spinae muscles, at the L4 and L5 level (series 5, image 1 and series 6, image 26). This is nonspecific. No definite abnormal enhancement, and other visible paraspinal muscles appear normal. There is mild subcutaneous edema in the lumbar spine, significance doubtful in this setting.  Much of the abdominal viscera are obscured on these images.  T11-T12: Grossly normal.  T12-L1:  Negative.  L1-L2:  Negative.  L2-L3:  Negative.  L3-L4: Mild disc space loss and disc desiccation. Mild circumferential disc osteophyte complex. Mild facet hypertrophy. No significant stenosis.  L4-L5:  Negative except for mild facet hypertrophy.  L5-S1: Disc space loss with disc desiccation and right eccentric circumferential disc osteophyte complex. Focal central component (series 6, image 31), but no spinal or definite lateral recess stenosis. No foraminal stenosis.  IMPRESSION: 1. Diffuse loss of normal bone marrow signal, but may be post treatment affect rather than due to widespread osseous metastatic disease. No destructive osseous lesion identified. 2. No evidence of lumbar discitis osteomyelitis. Mild to moderate chronic appearing disc degeneration at L3-L4 and L5-S1. No spinal stenosis. 3. Mild signal abnormality in the right lower lumbar erector spinae musculature, nonspecific. Early infectious myositis is not excluded in this setting.   Electronically Signed   By: Lars Pinks M.D.   On: 11/15/2013  20:30   Ct Abdomen Pelvis W Contrast  11/10/2013   CLINICAL DATA:  Persistent abdominal pain.  EXAM: CT ABDOMEN AND PELVIS WITH CONTRAST  TECHNIQUE: Multidetector CT imaging of the abdomen and pelvis was performed using the standard protocol following bolus administration of intravenous contrast.  CONTRAST:  163mL OMNIPAQUE IOHEXOL 300 MG/ML  SOLN  COMPARISON:  CT of the abdomen and pelvis 10/22/2013.  FINDINGS: Lung Bases: Linear opacities throughout the lung bases bilaterally, predominantly in a peribronchovascular distribution, favored to represent areas of peribronchial inflammation, potentially related to  recent recurrent bouts of aspiration. Markedly thickened distal esophagus. Numerous serpiginous structures adjacent to the distal esophagus, presumably esophageal varices. Tip of a porta cath in the right atrium.  Abdomen/Pelvis: The liver has a shrunken appearance and nodular contour, compatible with advanced cirrhosis. No definite focal hepatic lesions are noted on today's limited noncontrast CT examination. Numerous surgical clips are noted adjacent to the liver. Status post cholecystectomy. Potential stone or migrated surgical clip in the proximal aspect of the common bile duct demonstrated on images 19 and 20 of series 2, unchanged. The portal vein is dilated measuring 18 mm. The spleen is massively enlarged measuring 18.6 x 14.1 x 17.7 cm (estimated splenic volume of 2,321 mL).  New compared to the recent prior examination there is marked thickening of the colonic wall involving the cecum and ascending colon. These findings are concerning for colitis. More distal aspect of the colon appears uninvolved. No significant volume of ascites. No pneumoperitoneum. No pathologic distention of small bowel. No definite lymphadenopathy identified within the abdomen or pelvis on today's non contrast CT examination. Prostate gland and urinary bladder are unremarkable in appearance. Small umbilical hernia containing  only omental fat incidentally noted (unchanged).  Musculoskeletal: Multifocal sclerotic lesions are noted throughout the visualized axial and appendicular skeleton, most apparent within the sacrum at the level of S1, in the right side of the L5 vertebral body, and scattered throughout the remainder of the spine. These findings are similar to prior examinations. At T10 and T11 there is mild compression which is unchanged, with approximately 15-20% loss of height anteriorly at both vertebral body levels. Edema throughout the subcutaneous fat of the flanks bilaterally (right greater than left).  IMPRESSION: 1. Interval development of extensive colonic wall thickening involving the cecum and ascending colon, concerning for colitis. Clinical correlation is recommended. 2. Stigmata of cirrhosis and portal hypertension, including massive splenomegaly and esophageal varices redemonstrated, as above. 3. The appearance of the lung bases suggest sequela of multiple recent bouts of aspiration. 4. Small umbilical hernia containing only omental fat incidentally noted. 5. 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 6. Additional incidental findings, as above.   Electronically Signed   By: Vinnie Langton M.D.   On: 11/10/2013 19:56   Ct Abdomen Pelvis W Contrast  11/07/2013   CLINICAL DATA:  Abdominal pain  EXAM: CT ABDOMEN AND PELVIS WITH CONTRAST  TECHNIQUE: Multidetector CT imaging of the abdomen and pelvis was performed using the standard protocol following bolus administration of intravenous contrast.  CONTRAST:  82mL OMNIPAQUE IOHEXOL 300 MG/ML  SOLN  COMPARISON:  10/22/2013  FINDINGS: Cirrhotic liver.  Portal vein is grossly patent.  Bibasilar atelectasis.  Splenomegaly.  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  Right pleural effusion and abdominal ascites have resolved. Trace amount of fluid is  layering in the pelvis.  Pancreas, kidneys, adrenal glands are within normal limits.  Umbilical hernia containing adipose tissue is stable.  There are areas of wall thickening in the colon. This occurs in the ascending colon on image 53, transverse colon on image 39, and possibly within the sigmoid colon, although the sigmoid colon is decompressed.  Bladder and prostate are unremarkable.  Sclerotic changes within thoracic and lumbar vertebral bodies is not significantly changed. This may be associated with the patient's known history of lymphoma. Lytic areas in the iliac bones are also noted.  IMPRESSION: Cirrhotic liver.  Stable splenomegaly.  There are areas  of wall thickening throughout the colon. Differential diagnosis includes an inflammatory process or malignancy.  Pleural effusions and abdominal ascites have resolved. Trace fluid persist in the pelvis.   Electronically Signed   By: Maryclare Bean M.D.   On: 11/07/2013 16:11   Ct Abdomen Pelvis W Contrast  10/22/2013   CLINICAL DATA:  Lymphoma, prior cholecystectomy and appendectomy. Status post bone marrow transplant. Abdominal pain and lactic acidosis.  EXAM: CT ABDOMEN AND PELVIS WITH CONTRAST  TECHNIQUE: Multidetector CT imaging of the abdomen and pelvis was performed using the standard protocol following bolus administration of intravenous contrast.  CONTRAST:  117mL OMNIPAQUE IOHEXOL 300 MG/ML  SOLN  COMPARISON:  CT ABD - PELV W/ CM dated 10/16/2013  FINDINGS: Trace pleural effusions are increased since previously, with bibasilar dependent atelectasis.  Splenomegaly is reidentified. Nodular hepatic contour reidentified with abdominal ascites in all 4 quadrants. Interval increase in soft tissue edema compatible with third spacing. Mild irregularity of the omental fat adjacent to the anterior segment right hepatic lobe with adjacent surgical clips noted, image 14. Upper abdominal vascular collaterals likely indicate portal hypertension. Portal vein not  visualized, which may indicate chronic thrombosis.  Fat containing umbilical hernia noted. Foci of gas within the anterior abdominal wall subcutaneous fat likely indicating injection locations. Improvement in colonic wall thickening is identified. Adrenal glands, spleen, and kidneys are unremarkable. Cholecystectomy clips are noted. No free air. Small retroperitoneal nodes are reidentified, largest 5 mm in the aortocaval space for example image 33. Bladder is normal. Multiple axial sclerotic osseous lesions are reidentified.  IMPRESSION: Although colonic bowel wall thickening has improved since the previous exam, there has been interval worsening of ascites, pleural effusions, and subcutaneous soft tissue edema. 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  Right upper quadrant omental nodularity. Omental disease could have this appearance although postsurgical change is also possible given the presence of clips in this area.  Axial skeletal sclerotic lesions suspicious for metastatic disease.   Electronically Signed   By: Conchita Paris M.D.   On: 10/22/2013 13:04   Ir Removal Tun Access W/ Port W/o Fl Mod Sed  11/16/2013   CLINICAL DATA:  History of bacteremia, concern for seeding of patient's chronic Port a Catheter.  EXAM: REMOVAL OF IMPLANTED TUNNELED PORT-A-CATH  MEDICATIONS: The patient is currently admitted to the hospital and receiving intravenous antibiotics; The antibiotic was administered within 1 hour prior to the start of the procedure.  ANESTHESIA/SEDATION: Intravenous Versed, Fentanyl and Dilaudid was administered for conscious sedation  Sedation time: 20  FLUOROSCOPY TIME:  None  PROCEDURE: Informed written consent was obtained from the patient after a discussion of the risk, benefits and alternatives to the procedure. The patient was positioned supine on the fluoroscopy table and the  right chest Port-A-Cath site was prepped with chlorhexidine. A sterile gown and gloves were worn during the procedure. Local anesthesia was provided with 1% lidocaine with epinephrine. A timeout was performed prior to the initiation of the procedure.  An incision was made overlying the Port-A-Cath with a #15 scalpel. Utilizing sharp and blunt dissection, the Port-A-Cath was removed completely. The pocked was irrigated with sterile saline. Wound closure was performed with subcutaneous 3-0 Monocryl, subcuticular 4-0 Vicryl and Dermabond. A dressing was placed. The patient tolerated the procedure well without immediate post procedural complication.  FINDINGS: Successful removal of implant Port-A-Cath without immediate post procedural complication.  Visual inspection of  the Port a catheter pocket was negative for any definitive evidence of infection and as such, the incision was closed primarily with absorbable suture as above.  IMPRESSION: Successful removal of implanted Port-A-Cath.   Electronically Signed   By: Sandi Mariscal M.D.   On: 11/16/2013 16:57   Dg Abd Acute W/chest  11/06/2013   CLINICAL DATA:  Fever and emesis ; lymphoma  EXAM: ACUTE ABDOMEN SERIES (ABDOMEN 2 VIEW & CHEST 1 VIEW)  COMPARISON:  Chest radiograph Oct 16, 2013; CT abdomen and pelvis Oct 22, 2013  FINDINGS: PA chest: There is no edema or consolidation. Heart size and pulmonary vascularity are normal. No adenopathy. Port-A-Cath tip is at the cavoatrial junction. No pneumothorax.  Supine and upright abdomen: There remains thickening of scattered bowel loops. There are scattered air-fluid levels. There is no well-defined obstruction or free air. There is moderate stool in the colon. There are surgical clips in the right upper quadrant.  IMPRESSION: There remains some wall thickening of several loops of bowel which given the history, may have neoplastic etiology. Scattered air-fluid levels may represent ileus or enteritis. Early obstruction is a  consideration, although enteritis or ileus with tend to be more likely given the overall appearance of the bowel. No free air is seen.  There is no lung edema or consolidation.   Electronically Signed   By: Lowella Grip M.D.   On: 11/06/2013 11:22     Microbiology: Recent Results (from the past 240 hour(s))  CULTURE, BLOOD (ROUTINE X 2)     Status: None   Collection Time    11/11/13  8:25 PM      Result Value Ref Range Status   Specimen Description BLOOD RIGHT ARM   Final   Special Requests BOTTLES DRAWN AEROBIC AND ANAEROBIC 5CC EACH   Final   Culture  Setup Time     Final   Value: 11/12/2013 01:05     Performed at [facility]   Culture     Final   Value: VANCOMYCIN RESISTANT ENTEROCOCCUS ISOLATED     Note: LINEZOLID TO FOLLOW CRITICAL RESULT CALLED TO, READ BACK BY AND VERIFIED WITH: BRENDA HALL 11/15/13 0815 BY SMITHERSJ DAPTOMYCIN=3 UG/ML=SENSITIVE SENT TO REFERENCE LAB     Note: Gram Stain Report Called to,Read Back By and Verified With: FRANCIS PLEASANT 11/12/13 @ 8:25PM BY RUSCOE A.     Performed at [facility]   Report Status PENDING   Incomplete   Organism ID, Bacteria VANCOMYCIN RESISTANT ENTEROCOCCUS ISOLATED   Final  CULTURE, BLOOD (ROUTINE X 2)     Status: None   Collection Time    11/11/13  8:35 PM      Result Value Ref Range Status   Specimen Description BLOOD LEFT HAND   Final   Special Requests BOTTLES DRAWN AEROBIC ONLY 1CC   Final   Culture  Setup Time     Final   Value: 11/12/2013 01:05     Performed at [facility]   Culture     Final   Value: NO GROWTH 5 DAYS     Performed at [facility]   Report Status 11/18/2013 FINAL   Final  URINE CULTURE     Status: None   Collection Time    11/12/13 11:22 AM      Result Value Ref Range Status   Specimen Description URINE, CLEAN CATCH   Final   Special Requests Immunocompromised   Final   Culture  Setup Time  Final   Value: 11/12/2013 23:37     Performed at [employer]     Final   Value: NO GROWTH     Performed at [facility]   Culture     Final   Value: NO GROWTH     Performed at [facility]   Report Status 11/13/2013 FINAL   Final  CULTURE, BLOOD (ROUTINE X 2)     Status: None   Collection Time    11/17/13  1:00 PM      Result Value Ref Range Status   Specimen Description BLOOD LEFT ANTECUBITAL   Final   Special Requests BOTTLES DRAWN AEROBIC AND ANAEROBIC 10CC   Final   Culture  Setup Time     Final   Value: 11/17/2013 16:48     Performed at [facility]   Culture     Final   Value:        BLOOD CULTURE RECEIVED NO GROWTH TO DATE CULTURE WILL BE HELD FOR 5 DAYS BEFORE ISSUING A FINAL NEGATIVE REPORT     Performed at [facility]   Report Status PENDING   Incomplete  CULTURE, BLOOD (ROUTINE X 2)     Status: None   Collection Time    11/17/13  1:10 PM      Result Value Ref Range Status   Specimen Description BLOOD RIGHT HAND   Final   Special Requests BOTTLES DRAWN AEROBIC ONLY 2CC   Final   Culture  Setup Time     Final   Value: 11/17/2013 16:48     Performed at [facility]   Culture     Final   Value:        BLOOD CULTURE RECEIVED NO GROWTH TO DATE CULTURE WILL BE HELD FOR 5 DAYS BEFORE ISSUING A FINAL NEGATIVE REPORT     Performed at [facility]   Report Status PENDING   Incomplete     Labs: Basic Metabolic Panel:  Recent Labs Lab 11/16/13 0532 11/17/13 0540 11/18/13 0405 11/19/13 0418 11/20/13 0659 11/21/13 0437  NA 140 138 136* 138 138 136*  K 3.3* 3.5* 3.8 3.5* 3.8 3.9  CL 107 104 105 102 104 98  CO2 23 23 22 24 26 27   GLUCOSE 106* 118* 126* 86 131* 77  BUN 7 6 6 6 7 7   CREATININE 0.70 0.67 0.60 0.62 0.64 0.63  CALCIUM 7.5* 7.7* 8.0* 8.1* 8.2* 8.5  MG 1.3* 1.7 1.6  --   --   --    Liver Function Tests:  Recent Labs Lab 11/20/13 0659  AST 29  ALT 16  ALKPHOS 69  BILITOT 2.3*  PROT 5.9*  ALBUMIN 2.0*   No results found for this  basename: LIPASE, AMYLASE,  in the last 168 hours No results found for this basename: AMMONIA,  in the last 168 hours CBC:  Recent Labs Lab 11/15/13 0510 11/16/13 0532  WBC 2.7* 2.1*  HGB 9.0* 8.2*  HCT 25.7* 23.6*  MCV 104.9* 105.4*  PLT 31* 37*   Cardiac Enzymes:  Recent Labs Lab 11/16/13 0532 11/17/13 1647  CKTOTAL 48 55   BNP: No components found with this basename: POCBNP,  CBG: No results found for this basename: GLUCAP,  in the last 168 hours  Time coordinating discharge:  Greater than 30 minutes  Signed:  Tandrea Kommer, DO [facility] Pager: [phone] 11/21/2013, 4:43 PM

## 2013-11-20 NOTE — Telephone Encounter (Signed)
S/W DENISE ON 6500 NURSES DESK AND GAVE NP APPT FOR 06/30 @ 1:30 W/DR. [place].  DX- LARGE CELL LYMPHOMA REFERRING DR. DAVID TAT WELCOME PACKET MAILED.

## 2013-11-20 NOTE — Progress Notes (Signed)
[facility] for Infectious Disease    Date of Admission:  11/06/2013   Total days of antibiotics         Day 6 micafungin        Day 4 daptomycin           ID: Samuel Schultz is a 37 y.o. male with  HIV/AIDS, hx of lymphoma & pancytopenia from chemo presents with ongoing diarrhea due to colitis, infectious/malignancy causes ruled out.   Principal Problem:   Nausea and vomiting in adult Active Problems:   AIDS   Large cell lymphoma   Cirrhosis, non-alcoholic   Pancytopenia   Enteritis   Other pancytopenia   Acute kidney injury   Hypotension, unspecified   Sepsis   Hypoglycemia   Other and unspecified noninfectious gastroenteritis and colitis(558.9)   Gram-positive cocci bacteremia   NSVT (nonsustained ventricular tachycardia)   Protein-calorie malnutrition, severe   VRE bacteremia   Fungemia    Subjective: Still having some loose stools. Improved leg pain, decrease swelling from recent diuresis. Working with physical therapy, ambulating with walker  Medications:  . atovaquone  1,500 mg Oral Daily  . azithromycin  1,200 mg Oral Weekly  . cyclobenzaprine  5 mg Oral QHS  . DAPTOmycin (CUBICIN)  IV  750 mg Intravenous Q24H  . Darunavir Ethanolate  800 mg Oral Q breakfast  . diphenoxylate-atropine  2 tablet Oral TID  . emtricitabine-tenofovir  1 tablet Oral QHS  . feeding supplement (RESOURCE BREEZE)  1 Container Oral TID BM  . furosemide  40 mg Oral Daily  . micafungin [facility]) IV  100 mg Intravenous Daily  . morphine  30 mg Oral BID  . multivitamin with minerals  1 tablet Oral Daily  . nystatin  5 mL Oral QID  . pantoprazole  40 mg Oral BID  . phytonadione  10 mg Oral Daily  . ritonavir  100 mg Oral Q breakfast  . spironolactone  100 mg Oral Daily  . sucralfate  1 g Oral TID WC    Objective: Vital signs in last 24 hours: Temp:  [98.5 F (36.9 C)-98.8 F (37.1 C)] 98.8 F (37.1 C) (06/15 1504) Pulse Rate:  [84-90] 90 (06/15 1504) Resp:  [17] 17 (06/15  1504) BP: (115-124)/(64-68) 124/68 mmHg (06/15 1504) SpO2:  [94 %-98 %] 98 % (06/15 1504) Physical Exam  Constitutional: chronically ill appearing. No distress.  HENT:  Mouth/Throat: Oropharynx is clear and moist. No oropharyngeal exudate.  Cardiovascular: Normal rate, regular rhythm and normal heart sounds. Exam reveals no gallop and no friction rub.  No murmur heard.  Pulmonary/Chest: Effort normal and breath sounds normal. No respiratory distress. He has no wheezes.  Abdominal: Soft. Bowel sounds are normal. He exhibits no distension. There is no tenderness.  Lymphadenopathy:  He has no cervical adenopathy.  Neurological: He is alert and oriented to person, place, and time.  Skin: Skin is warm and dry. No rash noted. No erythema.  Psychiatric: He has a normal mood and affect. His behavior is normal.     Lab Results  Recent Labs  11/19/13 0418 11/20/13 0659  NA 138 138  K 3.5* 3.8  CL 102 104  CO2 24 26  BUN 6 7  CREATININE 0.62 0.64   Liver Panel  Recent Labs  11/20/13 0659  PROT 5.9*  ALBUMIN 2.0*  AST 29  ALT 16  ALKPHOS 69  BILITOT 2.3*   Sedimentation Rate No results found for this basename: ESRSEDRATE,  in the  last 72 hours C-Reactive Protein No results found for this basename: CRP,  in the last 72 hours  Microbiology: 6/12 blood cx ngtd 6/6 1 of 2 blood cx VRE 6/6 AFB blood cx c.albicans 6/5 blood cx 2 of 2 amp S enterococcus  Studies/Results: 6/8 TEE no veg  Assessment/Plan: Polymicrobial bloodstream infection with enterococcus and candida = likely for gi source. Subcutaneous port has been removed. Would use day of clearance as day #1 of antibiotics. Use 6/12 as day 1 of 14. Continue with daptomycin at 8mg /kg/daily plus fluconazole 400mg  IV daily until 11/30/13. We have ruled out endocarditis by TEE  - for discharge can switch from micafungin to fluconazole 400mg  IV. Concern that his diarrhea affects absorption of pills, thus recommend to keep  fluconazole as IV despite having 100% oral bioavailability  - ok to have him get new picc line tomorrow  HIV = continue on current regimen. Low level viremia could be due to malabsorption of medication  Diarrhea = will recommend continue with lomotil, infectious causes ruled out during our admission and recent may admission at [facility]  Will arrange follow up in ID clinic for continued care  [facility], [facility]ious [facility] Cell: [phone] Pager: [phone]  11/20/2013, 3:54 PM

## 2013-11-20 NOTE — Clinical Social Work Note (Signed)
SNF bed offered at [facility]- updated the SNF rep today as to plans for d/c with PICC and IV meds- they have requested to review these meds due to concern for cost of cubacin-  CSW sent updated info to them for review and await further word. MD advised plan for PICC placement prior to transfer- will update once SNF replies Eduard Clos, MSW, [place]

## 2013-11-20 NOTE — Progress Notes (Signed)
Physical Therapy Treatment Patient Details Name: Kery Batzel MRN: [id number] DOB: 1975/09/06 Today's Date: 11/20/2013    History of Present Illness Admitted as transfer from [facility] with N/V/D due to persistent colitis over 3 week period.  ?complication of HIV, malnitrition.    PT Comments    Patient in 10/10 leg pain and shaking upon standing. Did agree to sit up in recliner for a while. RN made aware. Will work on patient being premedicated prior to next PT session.   Follow Up Recommendations   (long term care. ALF v, SNF)     Equipment Recommendations  Rolling walker with 5" wheels    Recommendations for Other Services       Precautions / Restrictions Precautions Precautions: Fall    Mobility  Bed Mobility         Supine to sit: Supervision     General bed mobility comments: increased time bc of pain  Transfers Overall transfer level: Needs assistance Equipment used: Rolling walker (2 wheeled)   Sit to Stand: Min assist         General transfer comment: lifting and coming forward assist  Ambulation/Gait Ambulation/Gait assistance: Min guard Ambulation Distance (Feet): 10 Feet Assistive device: Rolling walker (2 wheeled)       General Gait Details: slow walk to recliner. Patient in 10/10 leg pain.    Stairs            Wheelchair Mobility    Modified Rankin (Stroke Patients Only)       Balance                                    Cognition Arousal/Alertness: Awake/alert Behavior During Therapy: WFL for tasks assessed/performed Overall Cognitive Status: Within Functional Limits for tasks assessed                      Exercises      General Comments        Pertinent Vitals/Pain 10/10 BLE. RN aware    Home Living                      Prior Function            PT Goals (current goals can now be found in the care plan section) Progress towards PT goals: Not progressing toward goals - comment    Frequency  Min 3X/week    PT Plan Current plan remains appropriate    Co-evaluation             End of Session   Activity Tolerance: Patient limited by pain Patient left: in chair;with call bell/phone within reach     Time: 1320-1336 PT Time Calculation (min): 16 min  Charges:  $Therapeutic Activity: 8-22 mins                    G Codes:      Jacqualyn Posey 11/20/2013, 3:18 PM 11/20/2013 Jacqualyn Posey PTA [phone] pager [phone] office

## 2013-11-21 LAB — BASIC METABOLIC PANEL
BUN: 7 mg/dL (ref 6–23)
CALCIUM: 8.5 mg/dL (ref 8.4–10.5)
CO2: 27 mEq/L (ref 19–32)
CREATININE: 0.63 mg/dL (ref 0.50–1.35)
Chloride: 98 mEq/L (ref 96–112)
Glucose, Bld: 77 mg/dL (ref 70–99)
Potassium: 3.9 mEq/L (ref 3.7–5.3)
Sodium: 136 mEq/L — ABNORMAL LOW (ref 137–147)

## 2013-11-21 LAB — PROTIME-INR
INR: 1.79 — ABNORMAL HIGH (ref 0.00–1.49)
PROTHROMBIN TIME: 20.3 s — AB (ref 11.6–15.2)

## 2013-11-21 LAB — ALDOLASE: Aldolase: 5.1 U/L (ref <=8.1)

## 2013-11-21 MED ORDER — FUROSEMIDE 10 MG/ML IJ SOLN
20.0000 mg | Freq: Once | INTRAMUSCULAR | Status: AC
  Administered 2013-11-21: 20 mg via INTRAVENOUS
  Filled 2013-11-21: qty 2

## 2013-11-21 NOTE — Progress Notes (Signed)
Agree with dietetic intern note/assessment. Pryor Ochoa RD, LDN Inpatient Clinical Dietitian Pager: [phone] After Hours Pager: [phone]

## 2013-11-21 NOTE — Progress Notes (Signed)
NUTRITION FOLLOW UP  Intervention:    Continue Resource Breeze TID, each supplement providing 250 kcal and 9 grams of protein   Daily multivitamin   RD to continue to follow   Nutrition Dx:   Increased nutrient needs related to HIV/AIDS as evidenced by estimated needs; ongoing   Goal:   Pt to meet >/=90% of estimated nutrition needs; unmet   Monitor:   PO intake, Supplement acceptance, weight trend, labs   Assessment:   Patient is a 38 y.o. 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 difficile 2 years ago, history of GERD and recent hospitalization for colitis present to emergency department with a chief complaint of intractable nausea vomiting and abdominal pain. 2 days PTA he developed nausea with persistent vomiting. He reported having 10-12 loose stools daily.   Pt recently hospitalized May 2015 for persistent fevers, nausea vomiting and diarrhea. Diagnosed with colitis of unknown etiology.   6/10: - Pt advanced to full liquid diet 6/9.  - Pt reports not having anything to drink  - Pt states that he likes resource breeze and would like to receive those - Pt continues to be in much pain and states that he cannot get into or out of bed without help   6/15: - Diet advanced to 2g sodium 6/15 - Pt reports no n/v or abdominal pain with eating - Pt states that he ate most of his breakfast - Per doc flowsheet records , pt is eating 50-100% of meals at this time  - Pt reports acceptance of the oral nutrition supplement   Height: Ht Readings from Last 1 Encounters:  11/06/13 5\' 9"  (1.753 m)    Weight Status:   Wt Readings from Last 1 Encounters:  11/06/13 203 lb 9.6 oz (92.352 kg)    Re-estimated needs:  Kcal: 2400 - 2600  Protein: 115 - 125 grams  Fluid: >/= 2.7 L/ day   Skin: +3 bilateral LE edema    Diet Order: Sodium Restricted   Intake/Output  Summary (Last 24 hours) at 11/21/13 1018 Last data filed at 11/20/13 1816  Gross per 24 hour  Intake    960 ml  Output   3550 ml  Net  -2590 ml    Last BM: 6/15   Labs:   Recent Labs Lab 11/16/13 0532 11/17/13 0540 11/18/13 0405 11/19/13 0418 11/20/13 0659 11/21/13 0437  NA 140 138 136* 138 138 136*  K 3.3* 3.5* 3.8 3.5* 3.8 3.9  CL 107 104 105 102 104 98  CO2 23 23 22 24 26 27   BUN 7 6 6 6 7 7   CREATININE 0.70 0.67 0.60 0.62 0.64 0.63  CALCIUM 7.5* 7.7* 8.0* 8.1* 8.2* 8.5  MG 1.3* 1.7 1.6  --   --   --   GLUCOSE 106* 118* 126* 86 131* 77    CBG (last 3)  No results found for this basename: GLUCAP,  in the last 72 hours  Scheduled Meds: . atovaquone  1,500 mg Oral Daily  . azithromycin  1,200 mg Oral Weekly  . cyclobenzaprine  5 mg Oral QHS  . DAPTOmycin (CUBICIN)  IV  750 mg Intravenous Q24H  . Darunavir Ethanolate  800 mg Oral Q breakfast  . diphenoxylate-atropine  2 tablet Oral TID  . emtricitabine-tenofovir  1 tablet Oral QHS  . feeding supplement (RESOURCE BREEZE)  1 Container Oral TID BM  . [START ON  11/22/2013] fluconazole (DIFLUCAN) IV  400 mg Intravenous Q24H  . fluconazole (DIFLUCAN) IV  800 mg Intravenous Once  . furosemide  40 mg Oral Daily  . morphine  30 mg Oral BID  . multivitamin with minerals  1 tablet Oral Daily  . nystatin  5 mL Oral QID  . pantoprazole  40 mg Oral BID  . phytonadione  10 mg Oral Daily  . ritonavir  100 mg Oral Q breakfast  . spironolactone  100 mg Oral Daily  . sucralfate  1 g Oral TID WC    Continuous Infusions:    Carrolyn Leigh, BS Dietetic Intern Pager: [phone]

## 2013-11-21 NOTE — Clinical Social Work Note (Signed)
SNF bed offer was rescinded due to the high cost of 3 meds ($10,000). CSW is working diligently with other SNF options to hopefully secure a bed ASAP. Will advise-   Eduard Clos, MSW, Latanya Presser [phone]

## 2013-11-22 LAB — BASIC METABOLIC PANEL
BUN: 7 mg/dL (ref 6–23)
CALCIUM: 8.3 mg/dL — AB (ref 8.4–10.5)
CO2: 30 meq/L (ref 19–32)
Chloride: 100 mEq/L (ref 96–112)
Creatinine, Ser: 0.62 mg/dL (ref 0.50–1.35)
GFR calc Af Amer: >90 mL/min (ref >90)
GFR calc non Af Amer: >90 mL/min (ref >90)
GLUCOSE: 90 mg/dL (ref 70–99)
Potassium: 3.8 mEq/L (ref 3.7–5.3)
Sodium: 137 mEq/L (ref 137–147)

## 2013-11-22 LAB — CK: Total CK: 44 U/L (ref 7–232)

## 2013-11-22 LAB — PROTIME-INR
INR: 1.76 — ABNORMAL HIGH (ref 0.00–1.49)
Prothrombin Time: 20 seconds — ABNORMAL HIGH (ref 11.6–15.2)

## 2013-11-22 NOTE — Progress Notes (Signed)
PROGRESS NOTE  Samuel Schultz EXH:[id number] DOB: 1976/02/23 DOA: 11/06/2013 PCP: PROVIDER NOT IN SYSTEM   Brief history  38 year old male with past medical history of HIV/AIDS on HAART (last CD4 10/18/2013 20), large cell lymphoma with liver infiltration (treawted with R-[facility] in 2010-2012 (followed at [facility]), recent admission for colitis (transferred to [facility] on 10/22/2013) where he had full w/up including negative stool studies, cx , negative colonoscopy and a bone marrow bx showing hypercellular marrow without finidings of lymphoma or leukemia. He showed improvement in symptoms there an was discharged off abx on 5/22. He then presented to [facility] ED 11/06/2013 with ongoing fatigue, weakness, diarrhea. He was transferred to [facility] for further care per GI (Dr. Dudley Major in [facility]) recommendations as he thought that the pt requires higher level of care.  Pt was started on flagyl for treatment of possible enteritis. CT abd done showed wall thickening throughout colon. The patient continues to have abdominal pain and diarrhea. Extensive workup for the diarrhea has been negative from an infectious disease standpoint. He has been treated symptomatically with opioids and anti-motility agents. His hospital stay has been complicated and prolonged by enterococcal bacteremia and fungemia. The patient has been started on daptomycin and micafungin with infectious disease following. Because of his persistent bacteremia and fungemia, his Port-A-Cath was removed on 11/16/2013 after some struggles with the patient's coagulopathy. The patient was started on chronic vitamin K replacement for his coagulopathy. His surveillance blood cultures are negative to date. The patient has developed worsening anasarca, and his diuretic dosing has been increased. Ultimate disposition is to SNF when stable and cleared by ID.   Subjective: Having pain and "spasms" in right hip and leg. Agreeing to try a K pad to help.   Assessment/Plan:    Persistent colitis  -possibly related to AIDS given recurrent symptoms for over 4 weeks and neg w/u  - repeat stool for Neg CMV PCR, C.diff , stool culture, ova and parasites negative. Diarrhea has somewhat improved on Lomotil but still has abdominal pain.  -Abdominal pain partly due to anasarca -Flagyl discontinued on 6/5 but was restarted even worsened colitis seen on repeat CT scan. Flagyl ultimately discontinued on 11/17/2013  -Patient continues to have diarrhea although frequency had reduced and stool is more formed  -GI consulted. 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 Alen Blew regarding coagulopathy--stated INR around 2.0 may be as good as it gets--recommends concurrent FFP infusion during port-a-cath which was done -11/16/2013--Port-A-Cath removed  -11/17/2013--repeat blood cultures--remains negative  -Initially refused procedure on 6/11 but ultimately consented   VRE bacteremia  -Etiology do to translocation of bacteria across GI tract from colitis  -Discontinued vancomycin and ampicillin  -Started Cubicin secondary to the patient's thrombocytopenia  -Continue vancomycin for 14 days based on the last negative culture (11/17/12) -Repeat CT scan of the abdomen showed worsening colitis.  -Patient has a right-sided Port-A-Cath for chemotherapy placed few years back .  - TEE on 6/8 which was negative for vegetation or thrombus. 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  -pt received 8 units FFP total for port-a-cath  removal on 6/11  Back pain  -MRI back--neg for  spondyldiskitis  -start flexeril--back pain slowly improving -increase MS Contin to 30mg  q12h  HIV/AIDS on HAART  - appreciate ID consult and recommendations  - pt is on ART . CD4 count of 20  - also on azithromycin and bactrim for PCP and MAC prophylaxis .monitor Qtc. -Continue mepron   Leg edema/Anasarca/Scrotal Edema  -venous duplex--neg for DVT  -ck 48  -likely due to hypoalbuminemia/cirrhosis-->anasarca  -Previous UA is negative for proteinuria  -No fever or SIRS  - furosemide and aldactone  - adolase low  Large cell lymphoma/Sclerotic bone Lumbosacral spine  - Was previously treated with chemotherapy. Reports that he is in remission. 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  -Bone marrow bx done at [facility] recently negative for lymphoma or MAI  -Sclerosis in vertebrae not typical for lymphoma--may be due to previous intrathecal chemo/XRT  -previously saw heme/onc @[facility] but prior to "Care Everywhere"   NSVT on 6/7  low magnesium corrected. EKG within normal limits. No further episodes.   Pancytopenia  - likely bone marrow suppression from HIV/AIDS and liver cirrhosis . No lymphoma seen on recent bone marrow bx done at [facility].   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 Has thrombocytopenia and coagulopathy.  -start daily vitamin K   Depression  Continue Lexapro.   Liver cirrhosis  Dr Tat resumed Aldactone and Lasix ad higher than home doses - follow BUN/ Cr with diuresis  AKI  Secondary to dehydration. Resolved.   Oral thrush  -On micafungin   Family Communication: Pt at beside  Disposition Plan: SNF when medically stable     Procedures/Studies: Dg Abd 1 View  11/12/2013   CLINICAL DATA:  Colitis.  EXAM: ABDOMEN - 1 VIEW  COMPARISON:  CT of the abdomen and pelvis 11/10/2013.  FINDINGS: Oral contrast material is noted throughout the colon and distal rectum. No pathologic distention of small bowel. No  pneumoperitoneum. Multiple surgical clips project over the right upper quadrant of the abdomen. Splenic contour appears enlarged.  IMPRESSION: 1. Nonobstructive bowel gas pattern. 2. No pneumoperitoneum. 3. Splenomegaly.   Electronically Signed   By: Vinnie Langton M.D.   On: 11/12/2013 11:47   Ct Head Wo Contrast  11/10/2013   CLINICAL DATA:  Persistent abdominal pain.  Dizziness.  Headache.  EXAM: CT HEAD WITHOUT CONTRAST  TECHNIQUE: Contiguous axial images were obtained from the base of the skull through the vertex without intravenous contrast.  COMPARISON:  No priors.  FINDINGS: No acute intracranial abnormalities. Specifically, no evidence of acute intracranial hemorrhage, no definite findings of acute/subacute cerebral ischemia, no mass, mass effect, hydrocephalus or abnormal intra or extra-axial fluid collections. Visualized paranasal sinuses and mastoids are well pneumatized. No acute displaced skull fractures are identified.  IMPRESSION: *No acute intracranial abnormalities. *The appearance of the brain is normal.   Electronically Signed   By: Vinnie Langton M.D.   On: 11/10/2013 19:43   Mr Lumbar Spine W Wo Contrast  11/15/2013   CLINICAL DATA:  38 year old male with bacteremia and new back pain. Severe low back pain radiating to the right lower extremity. Initial encounter. HIV, cirrhosis, lymphoma.  EXAM: MRI LUMBAR SPINE WITHOUT AND WITH CONTRAST  TECHNIQUE: Multiplanar and multiecho pulse sequences of the lumbar spine were obtained without and with intravenous contrast.  CONTRAST:  46mL MULTIHANCE GADOBENATE DIMEGLUMINE 529 MG/ML IV SOLN  COMPARISON:  CT Abdomen and Pelvis 11/10/2013 and earlier.  FINDINGS: Normal lumbar  segmentation depicted on comparison.  Diffuse loss of normal bone marrow signal in the visible spine, and left iliac bone. However, no marrow edema or destructive osseous lesion identified. No abnormal vertebral enhancement in the spine.  Visualized lower thoracic spinal cord  is normal with conus medularis at L1. Normal cauda equina nerve roots. No abnormal intradural enhancement.  There is mild abnormal STIR and T2 signal in the lower right erector spinae muscles, at the L4 and L5 level (series 5, image 1 and series 6, image 26). This is nonspecific. No definite abnormal enhancement, and other visible paraspinal muscles appear normal. There is mild subcutaneous edema in the lumbar spine, significance doubtful in this setting.  Much of the abdominal viscera are obscured on these images.  T11-T12: Grossly normal.  T12-L1:  Negative.  L1-L2:  Negative.  L2-L3:  Negative.  L3-L4: Mild disc space loss and disc desiccation. Mild circumferential disc osteophyte complex. Mild facet hypertrophy. No significant stenosis.  L4-L5:  Negative except for mild facet hypertrophy.  L5-S1: Disc space loss with disc desiccation and right eccentric circumferential disc osteophyte complex. Focal central component (series 6, image 31), but no spinal or definite lateral recess stenosis. No foraminal stenosis.  IMPRESSION: 1. Diffuse loss of normal bone marrow signal, but may be post treatment affect rather than due to widespread osseous metastatic disease. No destructive osseous lesion identified. 2. No evidence of lumbar discitis osteomyelitis. Mild to moderate chronic appearing disc degeneration at L3-L4 and L5-S1. No spinal stenosis. 3. Mild signal abnormality in the right lower lumbar erector spinae musculature, nonspecific. Early infectious myositis is not excluded in this setting.   Electronically Signed   By: Lars Pinks M.D.   On: 11/15/2013 20:30   Ct Abdomen Pelvis W Contrast  11/10/2013   CLINICAL DATA:  Persistent abdominal pain.  EXAM: CT ABDOMEN AND PELVIS WITH CONTRAST  TECHNIQUE: Multidetector CT imaging of the abdomen and pelvis was performed using the standard protocol following bolus administration of intravenous contrast.  CONTRAST:  196mL OMNIPAQUE IOHEXOL 300 MG/ML  SOLN  COMPARISON:  CT  of the abdomen and pelvis 10/22/2013.  FINDINGS: Lung Bases: Linear opacities throughout the lung bases bilaterally, predominantly in a peribronchovascular distribution, favored to represent areas of peribronchial inflammation, potentially related to recent recurrent bouts of aspiration. Markedly thickened distal esophagus. Numerous serpiginous structures adjacent to the distal esophagus, presumably esophageal varices. Tip of a porta cath in the right atrium.  Abdomen/Pelvis: The liver has a shrunken appearance and nodular contour, compatible with advanced cirrhosis. No definite focal hepatic lesions are noted on today's limited noncontrast CT examination. Numerous surgical clips are noted adjacent to the liver. Status post cholecystectomy. Potential stone or migrated surgical clip in the proximal aspect of the common bile duct demonstrated on images 19 and 20 of series 2, unchanged. The portal vein is dilated measuring 18 mm. The spleen is massively enlarged measuring 18.6 x 14.1 x 17.7 cm (estimated splenic volume of 2,321 mL).  New compared to the recent prior examination there is marked thickening of the colonic wall involving the cecum and ascending colon. These findings are concerning for colitis. More distal aspect of the colon appears uninvolved. No significant volume of ascites. No pneumoperitoneum. No pathologic distention of small bowel. No definite lymphadenopathy identified within the abdomen or pelvis on today's non contrast CT examination. Prostate gland and urinary bladder are unremarkable in appearance. Small umbilical hernia containing only omental fat incidentally noted (unchanged).  Musculoskeletal: Multifocal sclerotic lesions are noted throughout  the visualized axial and appendicular skeleton, most apparent within the sacrum at the level of S1, in the right side of the L5 vertebral body, and scattered throughout the remainder of the spine. These findings are similar to prior examinations. At  T10 and T11 there is mild compression which is unchanged, with approximately 15-20% loss of height anteriorly at both vertebral body levels. Edema throughout the subcutaneous fat of the flanks bilaterally (right greater than left).  IMPRESSION: 1. Interval development of extensive colonic wall thickening involving the cecum and ascending colon, concerning for colitis. Clinical correlation is recommended. 2. Stigmata of cirrhosis and portal hypertension, including massive splenomegaly and esophageal varices redemonstrated, as above. 3. The appearance of the lung bases suggest sequela of multiple recent bouts of aspiration. 4. Small umbilical hernia containing only omental fat incidentally noted. 5. 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 6. Additional incidental findings, as above.   Electronically Signed   By: Vinnie Langton M.D.   On: 11/10/2013 19:56   Ct Abdomen Pelvis W Contrast  11/07/2013   CLINICAL DATA:  Abdominal pain  EXAM: CT ABDOMEN AND PELVIS WITH CONTRAST  TECHNIQUE: Multidetector CT imaging of the abdomen and pelvis was performed using the standard protocol following bolus administration of intravenous contrast.  CONTRAST:  16mL OMNIPAQUE IOHEXOL 300 MG/ML  SOLN  COMPARISON:  10/22/2013  FINDINGS: Cirrhotic liver.  Portal vein is grossly patent.  Bibasilar atelectasis.  Splenomegaly.  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  Right pleural effusion and abdominal ascites have resolved. Trace amount of fluid is layering in the pelvis.  Pancreas, kidneys, adrenal glands are within normal limits.  Umbilical hernia containing adipose tissue is stable.  There are areas of wall thickening in the colon. This occurs in the ascending colon on image 53, transverse colon on image 39, and possibly within the sigmoid colon, although the sigmoid colon is decompressed.  Bladder and  prostate are unremarkable.  Sclerotic changes within thoracic and lumbar vertebral bodies is not significantly changed. This may be associated with the patient's known history of lymphoma. Lytic areas in the iliac bones are also noted.  IMPRESSION: Cirrhotic liver.  Stable splenomegaly.  There are areas of wall thickening throughout the colon. Differential diagnosis includes an inflammatory process or malignancy.  Pleural effusions and abdominal ascites have resolved. Trace fluid persist in the pelvis.   Electronically Signed   By: Maryclare Bean M.D.   On: 11/07/2013 16:11   Ct Abdomen Pelvis W Contrast  10/22/2013   CLINICAL DATA:  Lymphoma, prior cholecystectomy and appendectomy. Status post bone marrow transplant. Abdominal pain and lactic acidosis.  EXAM: CT ABDOMEN AND PELVIS WITH CONTRAST  TECHNIQUE: Multidetector CT imaging of the abdomen and pelvis was performed using the standard protocol following bolus administration of intravenous contrast.  CONTRAST:  115mL OMNIPAQUE IOHEXOL 300 MG/ML  SOLN  COMPARISON:  CT ABD - PELV W/ CM dated 10/16/2013  FINDINGS: Trace pleural effusions are increased since previously, with bibasilar dependent atelectasis.  Splenomegaly is reidentified. Nodular hepatic contour reidentified with abdominal ascites in all 4 quadrants. Interval increase in soft tissue edema compatible with third spacing. Mild irregularity of the omental fat adjacent to the anterior segment right hepatic lobe with adjacent surgical clips noted, image 14. Upper abdominal vascular collaterals likely indicate portal hypertension. Portal vein not visualized, which may indicate chronic thrombosis.  Fat containing umbilical hernia noted. Foci of gas within the  anterior abdominal wall subcutaneous fat likely indicating injection locations. Improvement in colonic wall thickening is identified. Adrenal glands, spleen, and kidneys are unremarkable. Cholecystectomy clips are noted. No free air. Small retroperitoneal  nodes are reidentified, largest 5 mm in the aortocaval space for example image 33. Bladder is normal. Multiple axial sclerotic osseous lesions are reidentified.  IMPRESSION: Although colonic bowel wall thickening has improved since the previous exam, there has been interval worsening of ascites, pleural effusions, and subcutaneous soft tissue edema. 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  Right upper quadrant omental nodularity. Omental disease could have this appearance although postsurgical change is also possible given the presence of clips in this area.  Axial skeletal sclerotic lesions suspicious for metastatic disease.   Electronically Signed   By: Conchita Paris M.D.   On: 10/22/2013 13:04   Ir Removal Tun Access W/ Port W/o Fl Mod Sed  11/16/2013   CLINICAL DATA:  History of bacteremia, concern for seeding of patient's chronic Port a Catheter.  EXAM: REMOVAL OF IMPLANTED TUNNELED PORT-A-CATH  MEDICATIONS: The patient is currently admitted to the hospital and receiving intravenous antibiotics; The antibiotic was administered within 1 hour prior to the start of the procedure.  ANESTHESIA/SEDATION: Intravenous Versed, Fentanyl and Dilaudid was administered for conscious sedation  Sedation time: 20  FLUOROSCOPY TIME:  None  PROCEDURE: Informed written consent was obtained from the patient after a discussion of the risk, benefits and alternatives to the procedure. The patient was positioned supine on the fluoroscopy table and the right chest Port-A-Cath site was prepped with chlorhexidine. A sterile gown and gloves were worn during the procedure. Local anesthesia was provided with 1% lidocaine with epinephrine. A timeout was performed prior to the initiation of the procedure.  An incision was made overlying the Port-A-Cath with a #15 scalpel. Utilizing sharp and blunt dissection, the  Port-A-Cath was removed completely. The pocked was irrigated with sterile saline. Wound closure was performed with subcutaneous 3-0 Monocryl, subcuticular 4-0 Vicryl and Dermabond. A dressing was placed. The patient tolerated the procedure well without immediate post procedural complication.  FINDINGS: Successful removal of implant Port-A-Cath without immediate post procedural complication.  Visual inspection of the Port a catheter pocket was negative for any definitive evidence of infection and as such, the incision was closed primarily with absorbable suture as above.  IMPRESSION: Successful removal of implanted Port-A-Cath.   Electronically Signed   By: Sandi Mariscal M.D.   On: 11/16/2013 16:57   Dg Abd Acute W/chest  11/06/2013   CLINICAL DATA:  Fever and emesis ; lymphoma  EXAM: ACUTE ABDOMEN SERIES (ABDOMEN 2 VIEW & CHEST 1 VIEW)  COMPARISON:  Chest radiograph Oct 16, 2013; CT abdomen and pelvis Oct 22, 2013  FINDINGS: PA chest: There is no edema or consolidation. Heart size and pulmonary vascularity are normal. No adenopathy. Port-A-Cath tip is at the cavoatrial junction. No pneumothorax.  Supine and upright abdomen: There remains thickening of scattered bowel loops. There are scattered air-fluid levels. There is no well-defined obstruction or free air. There is moderate stool in the colon. There are surgical clips in the right upper quadrant.  IMPRESSION: There remains some wall thickening of several loops of bowel which given the history, may have neoplastic etiology. Scattered air-fluid levels may represent ileus or enteritis. Early obstruction is a consideration, although enteritis or ileus with tend to be more likely given the overall appearance of  the bowel. No free air is seen.  There is no lung edema or consolidation.   Electronically Signed   By: Lowella Grip M.D.   On: 11/06/2013 11:22    Objective: Filed Vitals:   11/21/13 1307 11/21/13 2202 11/22/13 0556 11/22/13 1416  BP: 121/60 121/66  119/63 119/54  Pulse: 92 90 87 93  Temp: 98.1 F (36.7 C) 98.7 F (37.1 C) 98.5 F (36.9 C) 98 F (36.7 C)  TempSrc: Oral Oral Oral Oral  Resp: 17 18 16 16   Height:      Weight:      SpO2: 96% 96% 96% 98%    Intake/Output Summary (Last 24 hours) at 11/22/13 1754 Last data filed at 11/22/13 1601  Gross per 24 hour  Intake    840 ml  Output   5900 ml  Net  -5060 ml   Weight change:  Exam:   General:  Pt is alert, follows commands appropriately, not in acute distress  HEENT: No icterus, No thrush,  [place]/AT  Cardiovascular: RRR, S1/S2, no rubs, no gallops  Respiratory: CTA bilaterally, no wheezing, no crackles, no rhonchi  Abdomen: Soft/+BS, diffusely tender without any peritoneal signs, non distended, no guarding--anasarca  Extremities: 3+LE edema, No lymphangitis, No petechiae, No rashes, no synovitis  Data Reviewed: Basic Metabolic Panel:  Recent Labs Lab 11/16/13 0532 11/17/13 0540 11/18/13 0405 11/19/13 0418 11/20/13 0659 11/21/13 0437 11/22/13 0530  NA 140 138 136* 138 138 136* 137  K 3.3* 3.5* 3.8 3.5* 3.8 3.9 3.8  CL 107 104 105 102 104 98 100  CO2 23 23 22 24 26 27 30   GLUCOSE 106* 118* 126* 86 131* 77 90  BUN 7 6 6 6 7 7 7   CREATININE 0.70 0.67 0.60 0.62 0.64 0.63 0.62  CALCIUM 7.5* 7.7* 8.0* 8.1* 8.2* 8.5 8.3*  MG 1.3* 1.7 1.6  --   --   --   --    Liver Function Tests:  Recent Labs Lab 11/20/13 0659  AST 29  ALT 16  ALKPHOS 69  BILITOT 2.3*  PROT 5.9*  ALBUMIN 2.0*   No results found for this basename: LIPASE, AMYLASE,  in the last 168 hours No results found for this basename: AMMONIA,  in the last 168 hours CBC:  Recent Labs Lab 11/16/13 0532  WBC 2.1*  HGB 8.2*  HCT 23.6*  MCV 105.4*  PLT 37*   Cardiac Enzymes:  Recent Labs Lab 11/16/13 0532 11/17/13 1647 11/22/13 0530  CKTOTAL 48 55 44   BNP: No components found with this basename: POCBNP,  CBG: No results found for this basename: GLUCAP,  in the last 168  hours  Recent Results (from the past 240 hour(s))  CULTURE, BLOOD (ROUTINE X 2)     Status: None   Collection Time    11/17/13  1:00 PM      Result Value Ref Range Status   Specimen Description BLOOD LEFT ANTECUBITAL   Final   Special Requests BOTTLES DRAWN AEROBIC AND ANAEROBIC 10CC   Final   Culture  Setup Time     Final   Value: 11/17/2013 16:48     Performed at [facility]   Culture     Final   Value:        BLOOD CULTURE RECEIVED NO GROWTH TO DATE CULTURE WILL BE HELD FOR 5 DAYS BEFORE ISSUING A FINAL NEGATIVE REPORT     Performed at [facility]   Report Status PENDING  Incomplete  CULTURE, BLOOD (ROUTINE X 2)     Status: None   Collection Time    11/17/13  1:10 PM      Result Value Ref Range Status   Specimen Description BLOOD RIGHT HAND   Final   Special Requests BOTTLES DRAWN AEROBIC ONLY 2CC   Final   Culture  Setup Time     Final   Value: 11/17/2013 16:48     Performed at [facility]   Culture     Final   Value:        BLOOD CULTURE RECEIVED NO GROWTH TO DATE CULTURE WILL BE HELD FOR 5 DAYS BEFORE ISSUING A FINAL NEGATIVE REPORT     Performed at [facility]   Report Status PENDING   Incomplete     Scheduled Meds: . atovaquone  1,500 mg Oral Daily  . azithromycin  1,200 mg Oral Weekly  . cyclobenzaprine  5 mg Oral QHS  . DAPTOmycin (CUBICIN)  IV  750 mg Intravenous Q24H  . Darunavir Ethanolate  800 mg Oral Q breakfast  . diphenoxylate-atropine  2 tablet Oral TID  . emtricitabine-tenofovir  1 tablet Oral QHS  . feeding supplement (RESOURCE BREEZE)  1 Container Oral TID BM  . fluconazole (DIFLUCAN) IV  400 mg Intravenous Q24H  . furosemide  40 mg Oral Daily  . morphine  30 mg Oral BID  . multivitamin with minerals  1 tablet Oral Daily  . nystatin  5 mL Oral QID  . pantoprazole  40 mg Oral BID  . phytonadione  10 mg Oral Daily  . ritonavir  100 mg Oral Q breakfast  . spironolactone  100 mg Oral Daily  . sucralfate  1 g Oral  TID WC   Continuous Infusions:    Debbe Odea, MD [facility] If 7PM-7AM, please contact night-coverage [email or web address]  11/22/2013, 5:54 PM   LOS: 16 days

## 2013-11-22 NOTE — Progress Notes (Signed)
Physical Therapy Treatment Patient Details Name: Samuel Schultz MRN: [id number] DOB: 07/17/1975 Today's Date: 11/22/2013    History of Present Illness Admitted as transfer from [facility] with N/V/D due to persistent colitis over 3 week period.  ?complication of HIV, malnitrition.    PT Comments    Slowly progressing. His affect has improved with pt smiling on occasion, interacting with staff in hall.  Pain is still very prevalent, pt states it feels like the bone is going to pop out right here (R. Ant. Thigh )  Follow Up Recommendations  Other (comment)     Equipment Recommendations  Rolling walker with 5" wheels    Recommendations for Other Services       Precautions / Restrictions Precautions Precautions: Fall Restrictions Weight Bearing Restrictions: No    Mobility  Bed Mobility Overal bed mobility: Needs Assistance Bed Mobility: Sidelying to Sit   Sidelying to sit: HOB elevated;Supervision       General bed mobility comments: increased time bc of pain  Transfers Overall transfer level: Needs assistance Equipment used: Rolling walker (2 wheeled) Transfers: Sit to/from Stand Sit to Stand: Min guard         General transfer comment: cues for safety, no assist needed, increased time due to pain  Ambulation/Gait Ambulation/Gait assistance: Supervision Ambulation Distance (Feet): 900 Feet Assistive device: Rolling walker (2 wheeled) Gait Pattern/deviations: Step-through pattern Gait velocity: very slow Gait velocity interpretation: <1.8 ft/sec, indicative of risk for recurrent falls General Gait Details: slow, painful, "tin soldier-like" gait with little toe off on the Right.   Stairs            Wheelchair Mobility    Modified Rankin (Stroke Patients Only)       Balance Overall balance assessment: Needs assistance Sitting-balance support: No upper extremity supported Sitting balance-Leahy Scale: Fair     Standing balance support: No upper  extremity supported Standing balance-Leahy Scale: Fair                      Cognition Arousal/Alertness: Awake/alert Behavior During Therapy: WFL for tasks assessed/performed Overall Cognitive Status: Within Functional Limits for tasks assessed                      Exercises      General Comments        Pertinent Vitals/Pain More manageable with dilaudid    Home Living                      Prior Function            PT Goals (current goals can now be found in the care plan section) Acute Rehab PT Goals PT Goal Formulation: With patient Time For Goal Achievement: 11/28/13 Potential to Achieve Goals: Good Progress towards PT goals: Progressing toward goals    Frequency  Min 3X/week    PT Plan Current plan remains appropriate    Co-evaluation             End of Session   Activity Tolerance: Patient tolerated treatment well (quality and speed of gait limited by pain) Patient left: in chair;with call bell/phone within reach     Time: 2355-7322 PT Time Calculation (min): 31 min  Charges:  $Gait Training: 23-37 mins                    G Codes:      Jerie Basford, Tessie Fass 11/22/2013, 5:42 PM 11/22/2013  Donnella Sham, PT [phone]  (pager)

## 2013-11-22 NOTE — Clinical Social Work Note (Signed)
[facility] is hopeful for a (private room) bed to open up for patient on this Saturday- CSW is working with them to see what needs to be done to facilitate this transfer on Saturday- will advise.   Eduard Clos, MSW, [place]

## 2013-11-23 ENCOUNTER — Encounter: Payer: Self-pay | Admitting: Internal Medicine

## 2013-11-23 ENCOUNTER — Telehealth: Payer: Self-pay | Admitting: Internal Medicine

## 2013-11-23 LAB — CULTURE, BLOOD (ROUTINE X 2)
CULTURE: NO GROWTH
CULTURE: NO GROWTH

## 2013-11-23 LAB — PROTIME-INR
INR: 1.76 — ABNORMAL HIGH (ref 0.00–1.49)
Prothrombin Time: 20 seconds — ABNORMAL HIGH (ref 11.6–15.2)

## 2013-11-23 MED ORDER — DICLOFENAC SODIUM 1 % TD GEL
4.0000 g | Freq: Three times a day (TID) | TRANSDERMAL | Status: DC
  Administered 2013-11-23 – 2013-11-25 (×10): 4 g via TOPICAL
  Filled 2013-11-23 (×2): qty 100

## 2013-11-23 NOTE — Clinical Social Work Note (Signed)
SNF bed has been offered for patient at [facility] and will be available on Saturday. Patient updated and agreeable to this plan- he is planning to ask staff from [facility] where he was residing prior to admission to bring him some of his clothes to take to SNF rehab.  CSW answered all questions- he is still having a good bit of pain in leg- CSW offered support and will f/u tomorrow for further [place], MSW, [place]

## 2013-11-23 NOTE — Progress Notes (Signed)
PROGRESS NOTE  Samuel Schultz TIR:[id number] DOB: [birth year] DOA: 11/06/2013 PCP: Dr Olin Hauser [facility] [place]- [phone]   Brief history  38 year old male with past medical history of HIV/AIDS on HAART (last CD4 10/18/2013 20), large cell lymphoma with liver infiltration (treawted with [facility] in 2010-2012 (followed at [facility]), recent admission for colitis (transferred [facility] on 10/22/2013) where he had full w/up including negative stool studies, cx , negative colonoscopy and a bone marrow bx showing hypercellular marrow without finidings of lymphoma or leukemia. He showed improvement in symptoms there an was discharged off abx on 5/22. He then presented to [facility] ED 11/06/2013 with ongoing fatigue, weakness, diarrhea. He was transferred to [facility] for further care per GI (Dr. Dudley Major in [facility]) recommendations as he thought that the pt requires higher level of care.  Pt was started on flagyl for treatment of possible enteritis. CT abd done showed wall thickening throughout colon. The patient continues to have abdominal pain and diarrhea. Extensive workup for the diarrhea has been negative from an infectious disease standpoint. He has been treated symptomatically with opioids and anti-motility agents. His hospital stay has been complicated and prolonged by enterococcal bacteremia and fungemia. The patient has been started on daptomycin and micafungin with infectious disease following. Because of his persistent bacteremia and fungemia, his Port-A-Cath was removed on 11/16/2013 after some struggles with the patient's coagulopathy. The patient was started on chronic vitamin K replacement for his coagulopathy. His surveillance blood cultures are negative to date. The patient has developed worsening anasarca, and his diuretic dosing has been increased. Ultimate disposition is to SNF when stable and cleared by ID.   Subjective: Continues to have soreness in right lower back/buttock and  thigh  Assessment/Plan:  Persistent colitis  -possibly related to AIDS given recurrent symptoms for over 4 weeks and neg w/u  - repeat stool for Neg CMV PCR, C.diff , stool culture, ova and parasites negative. Diarrhea has somewhat improved on Lomotil but still has abdominal pain.  -Abdominal pain partly due to anasarca -Flagyl discontinued on 6/5 but was restarted even worsened colitis seen on repeat CT scan. Flagyl ultimately discontinued on 11/17/2013  -Patient continues to have diarrhea although frequency had reduced and stool is more formed  -GI consulted. No further workup recommended recent negative workups including studies and CMV culture from colon biopsy.--CMV PCR neg -Colonoscopy 10/27/2013 negative  -10/16/13 stool microsporidia negative  - tolerting a solid diet---Continue Lomotil for symptomatic treatment   Fungemia  -11/11/13 AFB culture grew C.albicans (due to infected port a cath) -empiric echinocandin -->switched to fluconzaole through 11/29/13 which will mark 14 days of therapy from the last negative culture -appreciate ID followup  - Dr Tat spoke with hematology, Dr. Alen Blew regarding coagulopathy--stated INR around 2.0 may be as good as it gets--recommends concurrent FFP infusion during port-a-cath which was done -11/16/2013--Port-A-Cath removed  -11/17/2013--repeat blood cultures--remains negative  -Initially refused procedure on 6/11 but ultimately consented   VRE bacteremia  -Etiology do to translocation of bacteria across GI tract from colitis  -Discontinued vancomycin and ampicillin  -Started Cubicin secondary to the patient's thrombocytopenia --stop 11/29/13 -Repeat CT scan of the abdomen showed worsening colitis.  - TEE on 6/8 which was negative for vegetation or thrombus.  -pt received 8 units FFP total for port-a-cath removal on 6/11  Back pain  -MRI back--neg for spondyldiskitis  -started flexeril--back pain slowly improving - MS Contin - 30mg  q12h - add  Voltaren  gel  HIV/AIDS on HAART  - appreciate ID consult and recommendations  - pt is on ART . CD4 count of 20  - also on azithromycin and bactrim for PCP and MAC prophylaxis .monitor Qtc. -Continue mepron   Leg edema/Anasarca/Scrotal Edema  -venous duplex 6/13--neg for DVT  -ck 48  -likely due to hypoalbuminemia/cirrhosis-->anasarca  -Previous UA is negative for proteinuria  -No fever or SIRS  - furosemide and aldactone  - adolase low  Large cell lymphoma/Sclerotic bone Lumbosacral spine  - Was previously treated with chemotherapy. Reports that he is in remission. 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  -Bone marrow bx done at [facility] recently negative for lymphoma or MAI  -Sclerosis in vertebrae not typical for lymphoma--may be due to previous intrathecal chemo/XRT  -previously saw heme/onc @[facility] but prior to "Care Everywhere"   NSVT on 6/7  low magnesium corrected. EKG within normal limits. No further episodes.   Pancytopenia  - likely bone marrow suppression from HIV/AIDS and liver cirrhosis . No lymphoma seen on recent bone marrow bx done at [facility].   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 Has thrombocytopenia and coagulopathy.  -start daily vitamin K   Depression  Continue Lexapro.   Liver cirrhosis  Dr Tat resumed Aldactone and Lasix ad higher than home doses   AKI  Secondary to dehydration. Resolved.   Oral thrush  -On fluconazole   Family Communication: Pt at beside  Disposition Plan: SNF - bed available at [facility] on Sat   Procedures/Studies: Dg Abd 1 View  11/12/2013   CLINICAL DATA:  Colitis.  EXAM: ABDOMEN - 1 VIEW  COMPARISON:  CT of the abdomen and pelvis 11/10/2013.  FINDINGS: Oral contrast material is noted throughout the colon and distal rectum. No pathologic distention of small bowel. No pneumoperitoneum. Multiple surgical clips project over the right upper quadrant of the abdomen.  Splenic contour appears enlarged.  IMPRESSION: 1. Nonobstructive bowel gas pattern. 2. No pneumoperitoneum. 3. Splenomegaly.   Electronically Signed   By: Vinnie Langton M.D.   On: 11/12/2013 11:47   Ct Head Wo Contrast  11/10/2013   CLINICAL DATA:  Persistent abdominal pain.  Dizziness.  Headache.  EXAM: CT HEAD WITHOUT CONTRAST  TECHNIQUE: Contiguous axial images were obtained from the base of the skull through the vertex without intravenous contrast.  COMPARISON:  No priors.  FINDINGS: No acute intracranial abnormalities. Specifically, no evidence of acute intracranial hemorrhage, no definite findings of acute/subacute cerebral ischemia, no mass, mass effect, hydrocephalus or abnormal intra or extra-axial fluid collections. Visualized paranasal sinuses and mastoids are well pneumatized. No acute displaced skull fractures are identified.  IMPRESSION: *No acute intracranial abnormalities. *The appearance of the brain is normal.   Electronically Signed   By: Vinnie Langton M.D.   On: 11/10/2013 19:43   Mr Lumbar Spine W Wo Contrast  11/15/2013   CLINICAL DATA:  38 year old male with bacteremia and new back pain. Severe low back pain radiating to the right lower extremity. Initial encounter. HIV, cirrhosis, lymphoma.  EXAM: MRI LUMBAR SPINE WITHOUT AND WITH CONTRAST  TECHNIQUE: Multiplanar and multiecho pulse sequences of the lumbar spine were obtained without and with intravenous contrast.  CONTRAST:  73mL MULTIHANCE GADOBENATE DIMEGLUMINE 529 MG/ML IV SOLN  COMPARISON:  CT Abdomen and Pelvis 11/10/2013 and earlier.  FINDINGS: Normal lumbar segmentation depicted on comparison.  Diffuse loss of normal bone marrow signal in the visible spine, and left iliac bone. However, no marrow  edema or destructive osseous lesion identified. No abnormal vertebral enhancement in the spine.  Visualized lower thoracic spinal cord is normal with conus medularis at L1. Normal cauda equina nerve roots. No abnormal intradural  enhancement.  There is mild abnormal STIR and T2 signal in the lower right erector spinae muscles, at the L4 and L5 level (series 5, image 1 and series 6, image 26). This is nonspecific. No definite abnormal enhancement, and other visible paraspinal muscles appear normal. There is mild subcutaneous edema in the lumbar spine, significance doubtful in this setting.  Much of the abdominal viscera are obscured on these images.  T11-T12: Grossly normal.  T12-L1:  Negative.  L1-L2:  Negative.  L2-L3:  Negative.  L3-L4: Mild disc space loss and disc desiccation. Mild circumferential disc osteophyte complex. Mild facet hypertrophy. No significant stenosis.  L4-L5:  Negative except for mild facet hypertrophy.  L5-S1: Disc space loss with disc desiccation and right eccentric circumferential disc osteophyte complex. Focal central component (series 6, image 31), but no spinal or definite lateral recess stenosis. No foraminal stenosis.  IMPRESSION: 1. Diffuse loss of normal bone marrow signal, but may be post treatment affect rather than due to widespread osseous metastatic disease. No destructive osseous lesion identified. 2. No evidence of lumbar discitis osteomyelitis. Mild to moderate chronic appearing disc degeneration at L3-L4 and L5-S1. No spinal stenosis. 3. Mild signal abnormality in the right lower lumbar erector spinae musculature, nonspecific. Early infectious myositis is not excluded in this setting.   Electronically Signed   By: Lars Pinks M.D.   On: 11/15/2013 20:30   Ct Abdomen Pelvis W Contrast  11/10/2013   CLINICAL DATA:  Persistent abdominal pain.  EXAM: CT ABDOMEN AND PELVIS WITH CONTRAST  TECHNIQUE: Multidetector CT imaging of the abdomen and pelvis was performed using the standard protocol following bolus administration of intravenous contrast.  CONTRAST:  146mL OMNIPAQUE IOHEXOL 300 MG/ML  SOLN  COMPARISON:  CT of the abdomen and pelvis 10/22/2013.  FINDINGS: Lung Bases: Linear opacities throughout the  lung bases bilaterally, predominantly in a peribronchovascular distribution, favored to represent areas of peribronchial inflammation, potentially related to recent recurrent bouts of aspiration. Markedly thickened distal esophagus. Numerous serpiginous structures adjacent to the distal esophagus, presumably esophageal varices. Tip of a porta cath in the right atrium.  Abdomen/Pelvis: The liver has a shrunken appearance and nodular contour, compatible with advanced cirrhosis. No definite focal hepatic lesions are noted on today's limited noncontrast CT examination. Numerous surgical clips are noted adjacent to the liver. Status post cholecystectomy. Potential stone or migrated surgical clip in the proximal aspect of the common bile duct demonstrated on images 19 and 20 of series 2, unchanged. The portal vein is dilated measuring 18 mm. The spleen is massively enlarged measuring 18.6 x 14.1 x 17.7 cm (estimated splenic volume of 2,321 mL).  New compared to the recent prior examination there is marked thickening of the colonic wall involving the cecum and ascending colon. These findings are concerning for colitis. More distal aspect of the colon appears uninvolved. No significant volume of ascites. No pneumoperitoneum. No pathologic distention of small bowel. No definite lymphadenopathy identified within the abdomen or pelvis on today's non contrast CT examination. Prostate gland and urinary bladder are unremarkable in appearance. Small umbilical hernia containing only omental fat incidentally noted (unchanged).  Musculoskeletal: Multifocal sclerotic lesions are noted throughout the visualized axial and appendicular skeleton, most apparent within the sacrum at the level of S1, in the right side of the L5  vertebral body, and scattered throughout the remainder of the spine. These findings are similar to prior examinations. At T10 and T11 there is mild compression which is unchanged, with approximately 15-20% loss of  height anteriorly at both vertebral body levels. Edema throughout the subcutaneous fat of the flanks bilaterally (right greater than left).  IMPRESSION: 1. Interval development of extensive colonic wall thickening involving the cecum and ascending colon, concerning for colitis. Clinical correlation is recommended. 2. Stigmata of cirrhosis and portal hypertension, including massive splenomegaly and esophageal varices redemonstrated, as above. 3. The appearance of the lung bases suggest sequela of multiple recent bouts of aspiration. 4. Small umbilical hernia containing only omental fat incidentally noted. 5. 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 6. Additional incidental findings, as above.   Electronically Signed   By: Vinnie Langton M.D.   On: 11/10/2013 19:56   Ct Abdomen Pelvis W Contrast  11/07/2013   CLINICAL DATA:  Abdominal pain  EXAM: CT ABDOMEN AND PELVIS WITH CONTRAST  TECHNIQUE: Multidetector CT imaging of the abdomen and pelvis was performed using the standard protocol following bolus administration of intravenous contrast.  CONTRAST:  42mL OMNIPAQUE IOHEXOL 300 MG/ML  SOLN  COMPARISON:  10/22/2013  FINDINGS: Cirrhotic liver.  Portal vein is grossly patent.  Bibasilar atelectasis.  Splenomegaly.  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  Right pleural effusion and abdominal ascites have resolved. Trace amount of fluid is layering in the pelvis.  Pancreas, kidneys, adrenal glands are within normal limits.  Umbilical hernia containing adipose tissue is stable.  There are areas of wall thickening in the colon. This occurs in the ascending colon on image 53, transverse colon on image 39, and possibly within the sigmoid colon, although the sigmoid colon is decompressed.  Bladder and prostate are unremarkable.  Sclerotic changes within thoracic and lumbar vertebral bodies is not  significantly changed. This may be associated with the patient's known history of lymphoma. Lytic areas in the iliac bones are also noted.  IMPRESSION: Cirrhotic liver.  Stable splenomegaly.  There are areas of wall thickening throughout the colon. Differential diagnosis includes an inflammatory process or malignancy.  Pleural effusions and abdominal ascites have resolved. Trace fluid persist in the pelvis.   Electronically Signed   By: Maryclare Bean M.D.   On: 11/07/2013 16:11   Ct Abdomen Pelvis W Contrast  10/22/2013   CLINICAL DATA:  Lymphoma, prior cholecystectomy and appendectomy. Status post bone marrow transplant. Abdominal pain and lactic acidosis.  EXAM: CT ABDOMEN AND PELVIS WITH CONTRAST  TECHNIQUE: Multidetector CT imaging of the abdomen and pelvis was performed using the standard protocol following bolus administration of intravenous contrast.  CONTRAST:  139mL OMNIPAQUE IOHEXOL 300 MG/ML  SOLN  COMPARISON:  CT ABD - PELV W/ CM dated 10/16/2013  FINDINGS: Trace pleural effusions are increased since previously, with bibasilar dependent atelectasis.  Splenomegaly is reidentified. Nodular hepatic contour reidentified with abdominal ascites in all 4 quadrants. Interval increase in soft tissue edema compatible with third spacing. Mild irregularity of the omental fat adjacent to the anterior segment right hepatic lobe with adjacent surgical clips noted, image 14. Upper abdominal vascular collaterals likely indicate portal hypertension. Portal vein not visualized, which may indicate chronic thrombosis.  Fat containing umbilical hernia noted. Foci of gas within the anterior abdominal wall subcutaneous fat likely indicating injection locations. Improvement in colonic wall thickening is identified. Adrenal glands, spleen, and kidneys are unremarkable.  Cholecystectomy clips are noted. No free air. Small retroperitoneal nodes are reidentified, largest 5 mm in the aortocaval space for example image 33. Bladder is  normal. Multiple axial sclerotic osseous lesions are reidentified.  IMPRESSION: Although colonic bowel wall thickening has improved since the previous exam, there has been interval worsening of ascites, pleural effusions, and subcutaneous soft tissue edema. 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  Right upper quadrant omental nodularity. Omental disease could have this appearance although postsurgical change is also possible given the presence of clips in this area.  Axial skeletal sclerotic lesions suspicious for metastatic disease.   Electronically Signed   By: Conchita Paris M.D.   On: 10/22/2013 13:04   Ir Removal Tun Access W/ Port W/o Fl Mod Sed  11/16/2013   CLINICAL DATA:  History of bacteremia, concern for seeding of patient's chronic Port a Catheter.  EXAM: REMOVAL OF IMPLANTED TUNNELED PORT-A-CATH  MEDICATIONS: The patient is currently admitted to the hospital and receiving intravenous antibiotics; The antibiotic was administered within 1 hour prior to the start of the procedure.  ANESTHESIA/SEDATION: Intravenous Versed, Fentanyl and Dilaudid was administered for conscious sedation  Sedation time: 20  FLUOROSCOPY TIME:  None  PROCEDURE: Informed written consent was obtained from the patient after a discussion of the risk, benefits and alternatives to the procedure. The patient was positioned supine on the fluoroscopy table and the right chest Port-A-Cath site was prepped with chlorhexidine. A sterile gown and gloves were worn during the procedure. Local anesthesia was provided with 1% lidocaine with epinephrine. A timeout was performed prior to the initiation of the procedure.  An incision was made overlying the Port-A-Cath with a #15 scalpel. Utilizing sharp and blunt dissection, the Port-A-Cath was removed completely. The pocked was irrigated with sterile saline. Wound closure was  performed with subcutaneous 3-0 Monocryl, subcuticular 4-0 Vicryl and Dermabond. A dressing was placed. The patient tolerated the procedure well without immediate post procedural complication.  FINDINGS: Successful removal of implant Port-A-Cath without immediate post procedural complication.  Visual inspection of the Port a catheter pocket was negative for any definitive evidence of infection and as such, the incision was closed primarily with absorbable suture as above.  IMPRESSION: Successful removal of implanted Port-A-Cath.   Electronically Signed   By: Sandi Mariscal M.D.   On: 11/16/2013 16:57   Dg Abd Acute W/chest  11/06/2013   CLINICAL DATA:  Fever and emesis ; lymphoma  EXAM: ACUTE ABDOMEN SERIES (ABDOMEN 2 VIEW & CHEST 1 VIEW)  COMPARISON:  Chest radiograph Oct 16, 2013; CT abdomen and pelvis Oct 22, 2013  FINDINGS: PA chest: There is no edema or consolidation. Heart size and pulmonary vascularity are normal. No adenopathy. Port-A-Cath tip is at the cavoatrial junction. No pneumothorax.  Supine and upright abdomen: There remains thickening of scattered bowel loops. There are scattered air-fluid levels. There is no well-defined obstruction or free air. There is moderate stool in the colon. There are surgical clips in the right upper quadrant.  IMPRESSION: There remains some wall thickening of several loops of bowel which given the history, may have neoplastic etiology. Scattered air-fluid levels may represent ileus or enteritis. Early obstruction is a consideration, although enteritis or ileus with tend to be more likely given the overall appearance of the bowel. No free air is seen.  There is no lung edema or consolidation.   Electronically Signed   By: Gwyndolyn Saxon  Jasmine December M.D.   On: 11/06/2013 11:22    Objective: Filed Vitals:   11/22/13 0556 11/22/13 1416 11/22/13 2108 11/23/13 0508  BP: 119/63 119/54 109/57 118/62  Pulse: 87 93 90 84  Temp: 98.5 F (36.9 C) 98 F (36.7 C) 98.4 F (36.9 C) 98.5  F (36.9 C)  TempSrc: Oral Oral Oral Oral  Resp: 16 16 20 20   Height:      Weight:      SpO2: 96% 98% 100% 95%    Intake/Output Summary (Last 24 hours) at 11/23/13 0809 Last data filed at 11/23/13 3474  Gross per 24 hour  Intake   2990 ml  Output   3675 ml  Net   -685 ml   Weight change:  Exam:   General:  Pt is alert, follows commands appropriately, not in acute distress  HEENT: No icterus, No thrush,  [place]/AT  Cardiovascular: RRR, S1/S2, no rubs, no gallops  Respiratory: CTA bilaterally, no wheezing, no crackles, no rhonchi  Abdomen: Soft/+BS, diffusely tender without any peritoneal signs, non distended, no guarding--anasarca  Extremities: no LE edema, No lymphangitis, No petechiae, No rashes, no synovitis  Back: tender on right lower back/ buttock and thigh  Data Reviewed: Basic Metabolic Panel:  Recent Labs Lab 11/17/13 0540 11/18/13 0405 11/19/13 0418 11/20/13 0659 11/21/13 0437 11/22/13 0530  NA 138 136* 138 138 136* 137  K 3.5* 3.8 3.5* 3.8 3.9 3.8  CL 104 105 102 104 98 100  CO2 23 22 24 26 27 30   GLUCOSE 118* 126* 86 131* 77 90  BUN 6 6 6 7 7 7   CREATININE 0.67 0.60 0.62 0.64 0.63 0.62  CALCIUM 7.7* 8.0* 8.1* 8.2* 8.5 8.3*  MG 1.7 1.6  --   --   --   --    Liver Function Tests:  Recent Labs Lab 11/20/13 0659  AST 29  ALT 16  ALKPHOS 69  BILITOT 2.3*  PROT 5.9*  ALBUMIN 2.0*   No results found for this basename: LIPASE, AMYLASE,  in the last 168 hours No results found for this basename: AMMONIA,  in the last 168 hours CBC: No results found for this basename: WBC, NEUTROABS, HGB, HCT, MCV, PLT,  in the last 168 hours Cardiac Enzymes:  Recent Labs Lab 11/17/13 1647 11/22/13 0530  CKTOTAL 55 44   BNP: No components found with this basename: POCBNP,  CBG: No results found for this basename: GLUCAP,  in the last 168 hours  Recent Results (from the past 240 hour(s))  CULTURE, BLOOD (ROUTINE X 2)     Status: None   Collection Time      11/17/13  1:00 PM      Result Value Ref Range Status   Specimen Description BLOOD LEFT ANTECUBITAL   Final   Special Requests BOTTLES DRAWN AEROBIC AND ANAEROBIC 10CC   Final   Culture  Setup Time     Final   Value: 11/17/2013 16:48     Performed at [facility]   Culture     Final   Value:        BLOOD CULTURE RECEIVED NO GROWTH TO DATE CULTURE WILL BE HELD FOR 5 DAYS BEFORE ISSUING A FINAL NEGATIVE REPORT     Performed at [facility]   Report Status PENDING   Incomplete  CULTURE, BLOOD (ROUTINE X 2)     Status: None   Collection Time    11/17/13  1:10 PM      Result Value  Ref Range Status   Specimen Description BLOOD RIGHT HAND   Final   Special Requests BOTTLES DRAWN AEROBIC ONLY 2CC   Final   Culture  Setup Time     Final   Value: 11/17/2013 16:48     Performed at [facility]   Culture     Final   Value:        BLOOD CULTURE RECEIVED NO GROWTH TO DATE CULTURE WILL BE HELD FOR 5 DAYS BEFORE ISSUING A FINAL NEGATIVE REPORT     Performed at [facility]   Report Status PENDING   Incomplete     Scheduled Meds: . atovaquone  1,500 mg Oral Daily  . azithromycin  1,200 mg Oral Weekly  . cyclobenzaprine  5 mg Oral QHS  . DAPTOmycin (CUBICIN)  IV  750 mg Intravenous Q24H  . Darunavir Ethanolate  800 mg Oral Q breakfast  . diclofenac sodium  4 g Topical QID  . diphenoxylate-atropine  2 tablet Oral TID  . emtricitabine-tenofovir  1 tablet Oral QHS  . feeding supplement (RESOURCE BREEZE)  1 Container Oral TID BM  . fluconazole (DIFLUCAN) IV  400 mg Intravenous Q24H  . furosemide  40 mg Oral Daily  . morphine  30 mg Oral BID  . multivitamin with minerals  1 tablet Oral Daily  . nystatin  5 mL Oral QID  . pantoprazole  40 mg Oral BID  . phytonadione  10 mg Oral Daily  . ritonavir  100 mg Oral Q breakfast  . spironolactone  100 mg Oral Daily  . sucralfate  1 g Oral TID WC   Continuous Infusions:    Debbe Odea, MD [facility] If  7PM-7AM, please contact night-coverage [email or web address]  11/23/2013, 8:09 AM   LOS: 17 days

## 2013-11-24 LAB — PROTIME-INR
INR: 1.78 — ABNORMAL HIGH (ref 0.00–1.49)
PROTHROMBIN TIME: 20.2 s — AB (ref 11.6–15.2)

## 2013-11-24 MED ORDER — SODIUM CHLORIDE 0.9 % IV SOLN: 750.0000 mg | INTRAVENOUS | Status: AC

## 2013-11-24 MED ORDER — BOOST / RESOURCE BREEZE PO LIQD: 1.0000 | Freq: Three times a day (TID) | ORAL | Status: DC

## 2013-11-24 MED ORDER — FLUCONAZOLE IN SODIUM CHLORIDE 400-0.9 MG/200ML-% IV SOLN: 400.0000 mg | INTRAVENOUS | Status: AC

## 2013-11-24 MED ORDER — HYDROMORPHONE HCL PF 1 MG/ML IJ SOLN
2.0000 mg | Freq: Once | INTRAMUSCULAR | Status: AC
  Administered 2013-11-24: 2 mg via INTRAVENOUS

## 2013-11-24 NOTE — Discharge Summary (Addendum)
Physician Discharge Summary  Samuel Schultz QXI:[id number] DOB: 1975/12/28 DOA: 11/06/2013  PCP: Nader Boys NOT IN SYSTEM  Admit date: 11/06/2013 Discharge date: 11/22/13 Recommendations for Outpatient Follow-up:  1. Pt will need to follow up with PCP in 2 weeks post discharge 2. Needs to see ID - the clinic will arrange the appt for 2-3 wks from now 3. Please obtain BMP and CBC in 1 wk 4. Continue daptomycin through and including 11/29/13 5. Continue fluconazole through and including 11/29/13 6. Remove PICC line after last dose of antimicrobials 7. 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 He showed improvement in symptoms there an was discharged off abx on 5/22.   He then presented to [facility] ED 11/06/2013 with ongoing fatigue, weakness, diarrhea. He was transferred to [facility] for further care per GI (Dr. Dudley Major in [facility]) recommendations as he thought that the pt requires higher level of care.  Pt was started on flagyl for treatment of possible enteritis. CT abd done showed wall thickening throughout colon. Extensive workup for the diarrhea has been negative from an infectious disease standpoint. He has been treated symptomatically with opioids and anti-motility agents.   His hospital stay has been complicated and prolonged by enterococcal bacteremia and fungemia.  The patient has been started on daptomycin and micafungin with infectious disease following. Because of his persistent bacteremia and fungemia, his Port-A-Cath was removed on 11/16/2013 after some struggles with the patient's coagulopathy. The patient was started on chronic vitamin K replacement for his coagulopathy. His surveillance blood cultures obtained 11/17/2013 are negative to date. The patient has developed worsening anasarca, and his diuretic dosing has been increased. The patient will continue on Cubicin and fluconazole through 11/29/2013 which is 14 days of antimicrobial therapy beyond the last negative culture.    Persistent colitis  - extensively worked up by GI and ID now - admitted to [facility] for acute colitis on 10/16/13-seen by Dr Laural Golden (GI) in [place] on 5/14- eventually transferred to [facility] -10/16/13 stool microsporidia negative  -Colonoscopy 10/27/2013 negative  -possibly related to AIDS given recurrent symptoms for over 4 weeks and neg w/u  - repeat stool for Neg CMV PCR, C.diff , stool culture, ova and parasites negative. Diarrhea has somewhat improved on Lomotil but still has abdominal pain.  -Abdominal pain partly due to anasarca which has resolved with aggressive diuresis -Flagyl discontinued on 6/5 but was restarted- worsened colitis seen on repeat CT scan. Flagyl ultimately discontinued on 11/17/2013  -GI consulted. No further workup recommended recent negative workups including studies and CMV culture from colon biopsy.--CMV PCR neg  - giving Lomotil per GI and ID recommendations to attempt to control diarrhea  Fungemia  -11/11/13 AFB culture grew C.albicans - suspected to be from port a cath -empiric echinocandin (D#6)-->switched to fluconzaole through 11/29/13 which will mark 14 days of therapy from the last  negative culture -Dr Tat spoke with hematology, Dr. Alen Blew regarding coagulopathy--stated INR around 2.0 may be as good as it gets--recommends concurrent FFP  infusion during port-a-cath removal  -11/16/2013--Port-A-Cath removed  -11/17/2013--repeat blood cultures--remains negative  -Initially refused procedure on 6/11 but ultimately consented   VRE bacteremia/Enteroccocus bacteremia  -Etiology suspected to be due to translocation of bacteria across GI tract from colitis vs Port a cath -Discontinued vancomycin and ampicillin  -Started Cubicin secondary to the patient's thrombocytopenia  -continue Cubicin through 11/29/2013 -Repeat CT scan of the abdomen showed worsening colitis.  - TEE on 6/8 which was negative for vegetation or thrombus. - per Dr Storm Frisk last note, she will arrange f/u at her ID clinic for continued care  Right sided lower back and leg pain -MRI back--neg for spondyldiskitis  -start flexeril--back pain slowly improving  -increase MS Contin to 30mg  q12h-->pain improving   HIV/AIDS on HAART  - appreciate ID consult and recommendations  - pt is on ART . CD4 count of 20  - also on azithromycin and bactrim for PCP and MAC prophylaxis .monitor Qtc. -Continue mepron--pt has been intermittenly refusing--counseled on importance of taking it  - he has an ID physician who manages his HIV but has moved to [place] and looking to find a local physician  Leg edema/Anasarca/Scrotal Edema  -venous duplex--neg for DVT  -ck 48  -likely due to hypoalbuminemia/cirrhosis-->anasarca  -Previous UA is negative for proteinuria  -No fever or SIRS  -improving with intermitten IV lasix during the hospitalization -Dr Tat doubled po furosemide and po aldactone resulting in resolution of edema- will cut dose back  to home dose before he becomes dehydrated -Dr Tat ordered an adolase--normal -2 gram (low sodium) sodium diet  Large cell lymphoma/Sclerotic bone Lumbosacral spine  - Was previously treated with chemotherapy. Reports that he is in remission. 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  -Bone marrow bx done at [facility]  recently negative for lymphoma or MAI  -Sclerosis in vertebrae not typical for lymphoma--may be due to previous intrathecal chemo/XRT  -previously saw heme/onc @[facility] but prior to "Care Everywhere"   NSVT on 6/7  low magnesium corrected. EKG within normal limits. No further episodes.   Pancytopenia  - likely bone marrow suppression from HIV/AIDS and liver cirrhosis . No lymphoma seen on recent bone marrow bx done at [facility]   Cirrhosis  -Has thrombocytopenia and coagulopathy.   Depression  Continue Lexapro.   Liver cirrhosis  Resumed Aldactone and Lasix.  -Doses of furosemide and Aldactone have been increased due to the patient's anasarca  AKI  Secondary to dehydration. Resolved.   Oral thrush  -On micafungin-->now on fluconzaole      Consultants: ID--Dr Baxter Flattery GI - Dr Thereasa Solo  Discharge Exam: Filed Vitals:   11/24/13 1357  BP: 108/55  Pulse: 90  Temp: 98.7 F (37.1 C)  Resp: 20   Filed Vitals:   11/23/13 1630 11/23/13 2137 11/24/13 0613 11/24/13 1357  BP:  113/55 108/57 108/55  Pulse:  91 81 90  Temp:  98.7 F (37.1 C) 98.4 F (36.9 C) 98.7 F (37.1 C)  TempSrc:  Oral Oral Oral  Resp:  20 20 20   Height:      Weight: 99.111 kg (218 lb 8 oz)     SpO2:  96% 98% 98%   General: A&O x 3, NAD, pleasant, cooperative Cardiovascular: RRR, no rub, no gallop, no S3 Respiratory: CTAB, no wheeze, no rhonchi Abdomen:soft, nontender, nondistended, positive bowel sounds Extremities: no  edema No lymphangitis, no petechiae  Discharge Instructions     Medication List    STOP taking these medications       polyethylene glycol packet  Commonly known as:  MIRALAX / GLYCOLAX     prazosin 1 MG capsule  Commonly known as:  MINIPRESS     senna 8.6 MG tablet  Commonly known as:  SENOKOT     STOOL SOFTENER 100 MG capsule  Generic drug:  docusate sodium      TAKE these medications       atovaquone 750 MG/5ML suspension  Commonly known as:  MEPRON  Take  1,500 mg by mouth daily.     azithromycin 600 MG tablet  Commonly known as:  ZITHROMAX  Take 2 tablets (1,200 mg total) by mouth once a week. Next dose on 11/24/13     cyclobenzaprine 5 MG tablet  Commonly known as:  FLEXERIL  Take 1 tablet (5 mg total) by mouth at bedtime.     DAPTOmycin 750 mg in sodium chloride 0.9 % 100 mL  Inject 750 mg into the vein daily.     diphenoxylate-atropine 2.5-0.025 MG per tablet  Commonly known as:  LOMOTIL  Take 2 tablets by mouth 3 (three) times daily.     emtricitabine-tenofovir 200-300 MG per tablet  Commonly known as:  TRUVADA  Take 1 tablet by mouth at bedtime.     escitalopram 20 MG tablet  Commonly known as:  LEXAPRO  Take 20 mg by mouth daily.     feeding supplement (RESOURCE BREEZE) Liqd  Take 1 Container by mouth 3 (three) times daily between meals.     fluconazole 400-0.9 MG/200ML-% IVPB  Commonly known as:  DIFLUCAN  Inject 200 mLs (400 mg total) into the vein daily.     furosemide 20 MG tablet  Commonly known as:  LASIX  Take 20 mg by mouth daily.     gabapentin 300 MG capsule  Commonly known as:  NEURONTIN  Take 300 mg by mouth 3 (three) times daily.     morphine 30 MG 12 hr tablet  Commonly known as:  MS CONTIN  Take 1 tablet (30 mg total) by mouth 2 (two) times daily.     pantoprazole 40 MG tablet  Commonly known as:  PROTONIX  Take 40 mg by mouth 2 (two) times daily.     PREZISTA 800 MG tablet  Generic drug:  Darunavir Ethanolate  Take 800 mg by mouth daily.     ritonavir 100 MG capsule  Commonly known as:  NORVIR  Take 100 mg by mouth daily with breakfast.     spironolactone 50 MG tablet  Commonly known as:  ALDACTONE  Take 50 mg by mouth daily.     sucralfate 1 G tablet  Commonly known as:  CARAFATE  Take 1 g by mouth 3 (three) times daily with meals.         The results of significant diagnostics from this hospitalization (including imaging, microbiology, ancillary and laboratory) are listed  below for reference.    Significant Diagnostic Studies: Dg Abd 1 View  11/12/2013   CLINICAL DATA:  Colitis.  EXAM: ABDOMEN - 1 VIEW  COMPARISON:  CT of the abdomen and pelvis 11/10/2013.  FINDINGS: Oral contrast material is noted throughout the colon and distal rectum. No pathologic distention of small bowel. No pneumoperitoneum. Multiple surgical clips project over the right upper quadrant of the abdomen. Splenic contour appears enlarged.  IMPRESSION: 1. Nonobstructive bowel gas  pattern. 2. No pneumoperitoneum. 3. Splenomegaly.   Electronically Signed   By: Vinnie Langton M.D.   On: 11/12/2013 11:47   Ct Head Wo Contrast  11/10/2013   CLINICAL DATA:  Persistent abdominal pain.  Dizziness.  Headache.  EXAM: CT HEAD WITHOUT CONTRAST  TECHNIQUE: Contiguous axial images were obtained from the base of the skull through the vertex without intravenous contrast.  COMPARISON:  No priors.  FINDINGS: No acute intracranial abnormalities. Specifically, no evidence of acute intracranial hemorrhage, no definite findings of acute/subacute cerebral ischemia, no mass, mass effect, hydrocephalus or abnormal intra or extra-axial fluid collections. Visualized paranasal sinuses and mastoids are well pneumatized. No acute displaced skull fractures are identified.  IMPRESSION: *No acute intracranial abnormalities. *The appearance of the brain is normal.   Electronically Signed   By: Vinnie Langton M.D.   On: 11/10/2013 19:43   Mr Lumbar Spine W Wo Contrast  11/15/2013   CLINICAL DATA:  38 year old male with bacteremia and new back pain. Severe low back pain radiating to the right lower extremity. Initial encounter. HIV, cirrhosis, lymphoma.  EXAM: MRI LUMBAR SPINE WITHOUT AND WITH CONTRAST  TECHNIQUE: Multiplanar and multiecho pulse sequences of the lumbar spine were obtained without and with intravenous contrast.  CONTRAST:  63mL MULTIHANCE GADOBENATE DIMEGLUMINE 529 MG/ML IV SOLN  COMPARISON:  CT Abdomen and Pelvis  11/10/2013 and earlier.  FINDINGS: Normal lumbar segmentation depicted on comparison.  Diffuse loss of normal bone marrow signal in the visible spine, and left iliac bone. However, no marrow edema or destructive osseous lesion identified. No abnormal vertebral enhancement in the spine.  Visualized lower thoracic spinal cord is normal with conus medularis at L1. Normal cauda equina nerve roots. No abnormal intradural enhancement.  There is mild abnormal STIR and T2 signal in the lower right erector spinae muscles, at the L4 and L5 level (series 5, image 1 and series 6, image 26). This is nonspecific. No definite abnormal enhancement, and other visible paraspinal muscles appear normal. There is mild subcutaneous edema in the lumbar spine, significance doubtful in this setting.  Much of the abdominal viscera are obscured on these images.  T11-T12: Grossly normal.  T12-L1:  Negative.  L1-L2:  Negative.  L2-L3:  Negative.  L3-L4: Mild disc space loss and disc desiccation. Mild circumferential disc osteophyte complex. Mild facet hypertrophy. No significant stenosis.  L4-L5:  Negative except for mild facet hypertrophy.  L5-S1: Disc space loss with disc desiccation and right eccentric circumferential disc osteophyte complex. Focal central component (series 6, image 31), but no spinal or definite lateral recess stenosis. No foraminal stenosis.  IMPRESSION: 1. Diffuse loss of normal bone marrow signal, but may be post treatment affect rather than due to widespread osseous metastatic disease. No destructive osseous lesion identified. 2. No evidence of lumbar discitis osteomyelitis. Mild to moderate chronic appearing disc degeneration at L3-L4 and L5-S1. No spinal stenosis. 3. Mild signal abnormality in the right lower lumbar erector spinae musculature, nonspecific. Early infectious myositis is not excluded in this setting.   Electronically Signed   By: Lars Pinks M.D.   On: 11/15/2013 20:30   Ct Abdomen Pelvis W  Contrast  11/10/2013   CLINICAL DATA:  Persistent abdominal pain.  EXAM: CT ABDOMEN AND PELVIS WITH CONTRAST  TECHNIQUE: Multidetector CT imaging of the abdomen and pelvis was performed using the standard protocol following bolus administration of intravenous contrast.  CONTRAST:  152mL OMNIPAQUE IOHEXOL 300 MG/ML  SOLN  COMPARISON:  CT of the abdomen and pelvis  10/22/2013.  FINDINGS: Lung Bases: Linear opacities throughout the lung bases bilaterally, predominantly in a peribronchovascular distribution, favored to represent areas of peribronchial inflammation, potentially related to recent recurrent bouts of aspiration. Markedly thickened distal esophagus. Numerous serpiginous structures adjacent to the distal esophagus, presumably esophageal varices. Tip of a porta cath in the right atrium.  Abdomen/Pelvis: The liver has a shrunken appearance and nodular contour, compatible with advanced cirrhosis. No definite focal hepatic lesions are noted on today's limited noncontrast CT examination. Numerous surgical clips are noted adjacent to the liver. Status post cholecystectomy. Potential stone or migrated surgical clip in the proximal aspect of the common bile duct demonstrated on images 19 and 20 of series 2, unchanged. The portal vein is dilated measuring 18 mm. The spleen is massively enlarged measuring 18.6 x 14.1 x 17.7 cm (estimated splenic volume of 2,321 mL).  New compared to the recent prior examination there is marked thickening of the colonic wall involving the cecum and ascending colon. These findings are concerning for colitis. More distal aspect of the colon appears uninvolved. No significant volume of ascites. No pneumoperitoneum. No pathologic distention of small bowel. No definite lymphadenopathy identified within the abdomen or pelvis on today's non contrast CT examination. Prostate gland and urinary bladder are unremarkable in appearance. Small umbilical hernia containing only omental fat incidentally  noted (unchanged).  Musculoskeletal: Multifocal sclerotic lesions are noted throughout the visualized axial and appendicular skeleton, most apparent within the sacrum at the level of S1, in the right side of the L5 vertebral body, and scattered throughout the remainder of the spine. These findings are similar to prior examinations. At T10 and T11 there is mild compression which is unchanged, with approximately 15-20% loss of height anteriorly at both vertebral body levels. Edema throughout the subcutaneous fat of the flanks bilaterally (right greater than left).  IMPRESSION: 1. Interval development of extensive colonic wall thickening involving the cecum and ascending colon, concerning for colitis. Clinical correlation is recommended. 2. Stigmata of cirrhosis and portal hypertension, including massive splenomegaly and esophageal varices redemonstrated, as above. 3. The appearance of the lung bases suggest sequela of multiple recent bouts of aspiration. 4. Small umbilical hernia containing only omental fat incidentally noted. 5. 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 6. Additional incidental findings, as above.   Electronically Signed   By: Vinnie Langton M.D.   On: 11/10/2013 19:56   Ct Abdomen Pelvis W Contrast  11/07/2013   CLINICAL DATA:  Abdominal pain  EXAM: CT ABDOMEN AND PELVIS WITH CONTRAST  TECHNIQUE: Multidetector CT imaging of the abdomen and pelvis was performed using the standard protocol following bolus administration of intravenous contrast.  CONTRAST:  35mL OMNIPAQUE IOHEXOL 300 MG/ML  SOLN  COMPARISON:  10/22/2013  FINDINGS: Cirrhotic liver.  Portal vein is grossly patent.  Bibasilar atelectasis.  Splenomegaly.  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  Right pleural effusion and abdominal ascites have resolved. Trace amount of fluid is layering in the pelvis.  Pancreas,  kidneys, adrenal glands are within normal limits.  Umbilical hernia containing adipose tissue is stable.  There are areas of wall thickening in the colon. This occurs in the ascending colon on image 53, transverse colon on image 39, and possibly within the sigmoid colon, although the sigmoid colon is decompressed.  Bladder and prostate are unremarkable.  Sclerotic changes within thoracic and lumbar vertebral bodies is not significantly changed. This may be associated  with the patient's known history of lymphoma. Lytic areas in the iliac bones are also noted.  IMPRESSION: Cirrhotic liver.  Stable splenomegaly.  There are areas of wall thickening throughout the colon. Differential diagnosis includes an inflammatory process or malignancy.  Pleural effusions and abdominal ascites have resolved. Trace fluid persist in the pelvis.   Electronically Signed   By: Maryclare Bean M.D.   On: 11/07/2013 16:11   Ct Abdomen Pelvis W Contrast  10/22/2013   CLINICAL DATA:  Lymphoma, prior cholecystectomy and appendectomy. Status post bone marrow transplant. Abdominal pain and lactic acidosis.  EXAM: CT ABDOMEN AND PELVIS WITH CONTRAST  TECHNIQUE: Multidetector CT imaging of the abdomen and pelvis was performed using the standard protocol following bolus administration of intravenous contrast.  CONTRAST:  139mL OMNIPAQUE IOHEXOL 300 MG/ML  SOLN  COMPARISON:  CT ABD - PELV W/ CM dated 10/16/2013  FINDINGS: Trace pleural effusions are increased since previously, with bibasilar dependent atelectasis.  Splenomegaly is reidentified. Nodular hepatic contour reidentified with abdominal ascites in all 4 quadrants. Interval increase in soft tissue edema compatible with third spacing. Mild irregularity of the omental fat adjacent to the anterior segment right hepatic lobe with adjacent surgical clips noted, image 14. Upper abdominal vascular collaterals likely indicate portal hypertension. Portal vein not visualized, which may indicate chronic  thrombosis.  Fat containing umbilical hernia noted. Foci of gas within the anterior abdominal wall subcutaneous fat likely indicating injection locations. Improvement in colonic wall thickening is identified. Adrenal glands, spleen, and kidneys are unremarkable. Cholecystectomy clips are noted. No free air. Small retroperitoneal nodes are reidentified, largest 5 mm in the aortocaval space for example image 33. Bladder is normal. Multiple axial sclerotic osseous lesions are reidentified.  IMPRESSION: Although colonic bowel wall thickening has improved since the previous exam, there has been interval worsening of ascites, pleural effusions, and subcutaneous soft tissue edema. 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  Right upper quadrant omental nodularity. Omental disease could have this appearance although postsurgical change is also possible given the presence of clips in this area.  Axial skeletal sclerotic lesions suspicious for metastatic disease.   Electronically Signed   By: Conchita Paris M.D.   On: 10/22/2013 13:04   Ir Removal Tun Access W/ Port W/o Fl Mod Sed  11/16/2013   CLINICAL DATA:  History of bacteremia, concern for seeding of patient's chronic Port a Catheter.  EXAM: REMOVAL OF IMPLANTED TUNNELED PORT-A-CATH  MEDICATIONS: The patient is currently admitted to the hospital and receiving intravenous antibiotics; The antibiotic was administered within 1 hour prior to the start of the procedure.  ANESTHESIA/SEDATION: Intravenous Versed, Fentanyl and Dilaudid was administered for conscious sedation  Sedation time: 20  FLUOROSCOPY TIME:  None  PROCEDURE: Informed written consent was obtained from the patient after a discussion of the risk, benefits and alternatives to the procedure. The patient was positioned supine on the fluoroscopy table and the right chest Port-A-Cath site was  prepped with chlorhexidine. A sterile gown and gloves were worn during the procedure. Local anesthesia was provided with 1% lidocaine with epinephrine. A timeout was performed prior to the initiation of the procedure.  An incision was made overlying the Port-A-Cath with a #15 scalpel. Utilizing sharp and blunt dissection, the Port-A-Cath was removed completely. The pocked was irrigated with sterile saline. Wound closure was performed with subcutaneous 3-0 Monocryl, subcuticular 4-0 Vicryl and Dermabond. A dressing was placed.  The patient tolerated the procedure well without immediate post procedural complication.  FINDINGS: Successful removal of implant Port-A-Cath without immediate post procedural complication.  Visual inspection of the Port a catheter pocket was negative for any definitive evidence of infection and as such, the incision was closed primarily with absorbable suture as above.  IMPRESSION: Successful removal of implanted Port-A-Cath.   Electronically Signed   By: Sandi Mariscal M.D.   On: 11/16/2013 16:57   Dg Abd Acute W/chest  11/06/2013   CLINICAL DATA:  Fever and emesis ; lymphoma  EXAM: ACUTE ABDOMEN SERIES (ABDOMEN 2 VIEW & CHEST 1 VIEW)  COMPARISON:  Chest radiograph Oct 16, 2013; CT abdomen and pelvis Oct 22, 2013  FINDINGS: PA chest: There is no edema or consolidation. Heart size and pulmonary vascularity are normal. No adenopathy. Port-A-Cath tip is at the cavoatrial junction. No pneumothorax.  Supine and upright abdomen: There remains thickening of scattered bowel loops. There are scattered air-fluid levels. There is no well-defined obstruction or free air. There is moderate stool in the colon. There are surgical clips in the right upper quadrant.  IMPRESSION: There remains some wall thickening of several loops of bowel which given the history, may have neoplastic etiology. Scattered air-fluid levels may represent ileus or enteritis. Early obstruction is a consideration, although enteritis  or ileus with tend to be more likely given the overall appearance of the bowel. No free air is seen.  There is no lung edema or consolidation.   Electronically Signed   By: Lowella Grip M.D.   On: 11/06/2013 11:22     Microbiology: Recent Results (from the past 240 hour(s))  CULTURE, BLOOD (ROUTINE X 2)     Status: None   Collection Time    11/17/13  1:00 PM      Result Value Ref Range Status   Specimen Description BLOOD LEFT ANTECUBITAL   Final   Special Requests BOTTLES DRAWN AEROBIC AND ANAEROBIC 10CC   Final   Culture  Setup Time     Final   Value: 11/17/2013 16:48     Performed at [facility]   Culture     Final   Value: NO GROWTH 5 DAYS     Performed at [facility]   Report Status 11/23/2013 FINAL   Final  CULTURE, BLOOD (ROUTINE X 2)     Status: None   Collection Time    11/17/13  1:10 PM      Result Value Ref Range Status   Specimen Description BLOOD RIGHT HAND   Final   Special Requests BOTTLES DRAWN AEROBIC ONLY 2CC   Final   Culture  Setup Time     Final   Value: 11/17/2013 16:48     Performed at [facility]   Culture     Final   Value: NO GROWTH 5 DAYS     Performed at [facility]   Report Status 11/23/2013 FINAL   Final     Labs: Basic Metabolic Panel:  Recent Labs Lab 11/18/13 0405 11/19/13 0418 11/20/13 0659 11/21/13 0437 11/22/13 0530  NA 136* 138 138 136* 137  K 3.8 3.5* 3.8 3.9 3.8  CL 105 102 104 98 100  CO2 22 24 26 27 30   GLUCOSE 126* 86 131* 77 90  BUN 6 6 7 7 7   CREATININE 0.60 0.62 0.64 0.63 0.62  CALCIUM 8.0* 8.1* 8.2* 8.5 8.3*  MG 1.6  --   --   --   --  Liver Function Tests:  Recent Labs Lab 11/20/13 0659  AST 29  ALT 16  ALKPHOS 69  BILITOT 2.3*  PROT 5.9*  ALBUMIN 2.0*   No results found for this basename: LIPASE, AMYLASE,  in the last 168 hours No results found for this basename: AMMONIA,  in the last 168 hours CBC: No results found for this basename: WBC, NEUTROABS, HGB, HCT,  MCV, PLT,  in the last 168 hours Cardiac Enzymes:  Recent Labs Lab 11/17/13 1647 11/22/13 0530  CKTOTAL 55 44   BNP: No components found with this basename: POCBNP,  CBG: No results found for this basename: GLUCAP,  in the last 168 hours  Time coordinating discharge:  Greater than 30 minutes  SignedDebbe Odea, MD [facility] Pager: [phone] 11/24/2013, 4:46 PM

## 2013-11-24 NOTE — Progress Notes (Signed)
Physical Therapy Treatment Patient Details Name: Samuel Schultz MRN: [id number] DOB: December 09, 1975 Today's Date: 11/24/2013    History of Present Illness Admitted as transfer from [facility] with N/V/D due to persistent colitis over 3 week period.  ?complication of HIV, malnitrition.    PT Comments    Pt continues to be limited with mobility and demo gt abnormalities due to increased pain in Rt LE with weightbearing. Pt continues to be great candidate for SNF for post acute placement. Will cont to follow per POC.   Follow Up Recommendations  SNF     Equipment Recommendations  Rolling walker with 5" wheels    Recommendations for Other Services       Precautions / Restrictions Precautions Precautions: Fall Restrictions Weight Bearing Restrictions: No    Mobility  Bed Mobility Overal bed mobility: Modified Independent Bed Mobility: Supine to Sit     Supine to sit: Modified independent (Device/Increase time);HOB elevated     General bed mobility comments: HOB elevated and definite use of handrails due to pain  Transfers Overall transfer level: Needs assistance Equipment used: Rolling walker (2 wheeled) Transfers: Sit to/from Stand Sit to Stand: Min guard         General transfer comment: min gaurd to steady; cues for hand placement and sequencing with RW   Ambulation/Gait Ambulation/Gait assistance: Min assist Ambulation Distance (Feet): 500 Feet Assistive device: Rolling walker (2 wheeled) Gait Pattern/deviations: Step-through pattern;Decreased stance time - right;Decreased step length - left;Antalgic;Narrow base of support;Trunk flexed Gait velocity: very slow Gait velocity interpretation: <1.8 ft/sec, indicative of risk for recurrent falls General Gait Details: requires multiple rest breaks due to pain; cues for gt sequencing and upright posture; pt limited due to pain with incr WB on Rt LE    Stairs            Wheelchair Mobility    Modified Rankin (Stroke  Patients Only)       Balance Overall balance assessment: Needs assistance;History of Falls Sitting-balance support: Feet supported;No upper extremity supported Sitting balance-Leahy Scale: Good     Standing balance support: During functional activity;Bilateral upper extremity supported;No upper extremity supported Standing balance-Leahy Scale: Fair Standing balance comment: was able to reach out BOS without UE support momentairly with min guard                     Cognition Arousal/Alertness: Awake/alert Behavior During Therapy: Anxious Overall Cognitive Status: Within Functional Limits for tasks assessed                      Exercises General Exercises - Lower Extremity Ankle Circles/Pumps: AROM;Strengthening;Both;10 reps;Supine    General Comments        Pertinent Vitals/Pain 8/10; patient repositioned for comfort with Rt LE elevated    Home Living                      Prior Function            PT Goals (current goals can now be found in the care plan section) Acute Rehab PT Goals Patient Stated Goal: Get well and be independent again PT Goal Formulation: With patient Time For Goal Achievement: 11/28/13 Potential to Achieve Goals: Good Progress towards PT goals: Progressing toward goals    Frequency  Min 3X/week    PT Plan Current plan remains appropriate    Co-evaluation             End of Session Equipment  Utilized During Treatment: Gait belt Activity Tolerance: Patient limited by pain Patient left: in chair;with call bell/phone within reach     Time: 4132-4401 PT Time Calculation (min): 28 min  Charges:  [employer] Training: 23-37 mins                    G CodesElie Confer [place], [place]  [phone] 11/24/2013, 4:40 PM

## 2013-11-24 NOTE — Clinical Social Work Note (Signed)
[facility] is prepared to accept patient tomorrow- patient agreeable and has completed the paperwork for admission. CSW covering tomorrow to finalize transfer- all questions answered- support provided.     Eduard Clos, MSW, [place]

## 2013-11-24 NOTE — Progress Notes (Signed)
PROGRESS NOTE  Samuel Schultz YPP:[id number] DOB: Aug 16, 1975 DOA: 11/06/2013 PCP: Dr Olin Hauser [facility] [place]- [phone]   Brief history  38 year old male with past medical history of HIV/AIDS on HAART (last CD4 10/18/2013 20), large cell lymphoma with liver infiltration (treawted with [facility] in 2010-2012 (followed at [facility]), recent admission for colitis (transfer[facility] on 10/22/[facility]) where he had full w/up including negative stool studies, cx , negative colonoscopy and a bone marrow bx showing hypercellular marrow without finidings of lymphoma or leukemia. He showed improvement in symptoms there an was discharged off abx on 5/22. He then presented to [facility] ED 11/06/2013 with ongoing fatigue, weakness, diarrhea. He was transferred to [facility] for further care per GI (Dr. Dudley Major in [facility]) recommendations as he thought that the pt requires higher level of care.  Pt was started on flagyl for treatment of possible enteritis. CT abd done showed wall thickening throughout colon. The patient continues to have abdominal pain and diarrhea. Extensive workup for the diarrhea has been negative from an infectious disease standpoint. He has been treated symptomatically with opioids and anti-motility agents. His hospital stay has been complicated and prolonged by enterococcal bacteremia and fungemia. The patient has been started on daptomycin and micafungin with infectious disease following. Because of his persistent bacteremia and fungemia, his Port-A-Cath was removed on 11/16/2013 after some struggles with the patient's coagulopathy. The patient was started on chronic vitamin K replacement for his coagulopathy. His surveillance blood cultures are negative to date. The patient has developed worsening anasarca, and his diuretic dosing has been increased. Ultimate disposition is to SNF when stable and cleared by ID.   Subjective: Continues to have soreness in right lower back/buttock and  thigh  Assessment/Plan:  Persistent colitis  -possibly related to AIDS given recurrent symptoms for over 4 weeks and neg w/u  - repeat stool for Neg CMV PCR, C.diff , stool culture, ova and parasites negative. Diarrhea has somewhat improved on Lomotil but still has abdominal pain.  -Abdominal pain partly due to anasarca -Flagyl discontinued on 6/5 but was restarted even worsened colitis seen on repeat CT scan. Flagyl ultimately discontinued on 11/17/2013  -Patient continues to have diarrhea although frequency had reduced and stool is more formed  -GI consulted. No further workup recommended recent negative workups including studies and CMV culture from colon biopsy.--CMV PCR neg -Colonoscopy 10/27/2013 negative  -10/16/13 stool microsporidia negative  - tolerting a solid diet---Continue Lomotil for symptomatic treatment   Fungemia  -11/11/13 AFB culture grew C.albicans (due to infected port a cath) -empiric echinocandin -->switched to fluconzaole through 11/29/13 which will mark 14 days of therapy from the last negative culture -appreciate ID followup  - Dr Tat spoke with hematology, Dr. Alen Blew regarding coagulopathy--stated INR around 2.0 may be as good as it gets--recommends concurrent FFP infusion during port-a-cath which was done -11/16/2013--Port-A-Cath removed  -11/17/2013--repeat blood cultures--remains negative  -Initially refused procedure on 6/11 but ultimately consented   VRE bacteremia  -Etiology do to translocation of bacteria across GI tract from colitis  -Discontinued vancomycin and ampicillin  -Started Cubicin secondary to the patient's thrombocytopenia --stop 11/29/13 -Repeat CT scan of the abdomen showed worsening colitis.  - TEE on 6/8 which was negative for vegetation or thrombus.  -pt received 8 units FFP total for port-a-cath removal on 6/11  Back pain  -MRI back--neg for spondyldiskitis  -started flexeril--back pain slowly improving - MS Contin - 30mg  q12h - add  Voltaren  gel  HIV/AIDS on HAART  - appreciate ID consult and recommendations  - pt is on ART . CD4 count of 20  - also on azithromycin and bactrim for PCP and MAC prophylaxis .monitor Qtc. -Continue mepron   Leg edema/Anasarca/Scrotal Edema  -venous duplex 6/13--neg for DVT  -ck 48  -likely due to hypoalbuminemia/cirrhosis-->anasarca  -Previous UA is negative for proteinuria  -No fever or SIRS  - given furosemide and aldactone on higher than home dose- edema has resolved  Large cell lymphoma/Sclerotic bone Lumbosacral spine  - Was previously treated with chemotherapy. Reports that he is in remission. 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  -Bone marrow bx done at [facility] recently negative for lymphoma or MAI  -Sclerosis in vertebrae not typical for lymphoma--may be due to previous intrathecal chemo/XRT  -previously saw heme/onc @[facility] but prior to "[facility]"   NSVT on 6/7  low magnesium corrected. EKG within normal limits. No further episodes.   Pancytopenia  - likely bone marrow suppression from HIV/AIDS and liver cirrhosis . No lymphoma seen on recent bone marrow bx done at [facility].   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 Has thrombocytopenia and coagulopathy.  -started on daily vitamin K   Depression  Continue Lexapro.   Liver cirrhosis  Dr Tat resumed Aldactone and Lasix at higher than home doses   Oral thrush  -On fluconazole   Family Communication: Pt at beside  Disposition Plan: SNF - bed available at [facility] on Sat   Procedures/Studies: Dg Abd 1 View  11/12/2013   CLINICAL DATA:  Colitis.  EXAM: ABDOMEN - 1 VIEW  COMPARISON:  CT of the abdomen and pelvis 11/10/2013.  FINDINGS: Oral contrast material is noted throughout the colon and distal rectum. No pathologic distention of small bowel. No pneumoperitoneum. Multiple surgical clips project over the right upper quadrant of the abdomen.  Splenic contour appears enlarged.  IMPRESSION: 1. Nonobstructive bowel gas pattern. 2. No pneumoperitoneum. 3. Splenomegaly.   Electronically Signed   By: Vinnie Langton M.D.   On: 11/12/2013 11:47   Ct Head Wo Contrast  11/10/2013   CLINICAL DATA:  Persistent abdominal pain.  Dizziness.  Headache.  EXAM: CT HEAD WITHOUT CONTRAST  TECHNIQUE: Contiguous axial images were obtained from the base of the skull through the vertex without intravenous contrast.  COMPARISON:  No priors.  FINDINGS: No acute intracranial abnormalities. Specifically, no evidence of acute intracranial hemorrhage, no definite findings of acute/subacute cerebral ischemia, no mass, mass effect, hydrocephalus or abnormal intra or extra-axial fluid collections. Visualized paranasal sinuses and mastoids are well pneumatized. No acute displaced skull fractures are identified.  IMPRESSION: *No acute intracranial abnormalities. *The appearance of the brain is normal.   Electronically Signed   By: Vinnie Langton M.D.   On: 11/10/2013 19:43   Mr Lumbar Spine W Wo Contrast  11/15/2013   CLINICAL DATA:  38 year old male with bacteremia and new back pain. Severe low back pain radiating to the right lower extremity. Initial encounter. HIV, cirrhosis, lymphoma.  EXAM: MRI LUMBAR SPINE WITHOUT AND WITH CONTRAST  TECHNIQUE: Multiplanar and multiecho pulse sequences of the lumbar spine were obtained without and with intravenous contrast.  CONTRAST:  42mL MULTIHANCE GADOBENATE DIMEGLUMINE 529 MG/ML IV SOLN  COMPARISON:  CT Abdomen and Pelvis 11/10/2013 and earlier.  FINDINGS: Normal lumbar segmentation depicted on comparison.  Diffuse loss of normal bone marrow signal in the visible spine, and left iliac bone. However, no marrow edema or  destructive osseous lesion identified. No abnormal vertebral enhancement in the spine.  Visualized lower thoracic spinal cord is normal with conus medularis at L1. Normal cauda equina nerve roots. No abnormal intradural  enhancement.  There is mild abnormal STIR and T2 signal in the lower right erector spinae muscles, at the L4 and L5 level (series 5, image 1 and series 6, image 26). This is nonspecific. No definite abnormal enhancement, and other visible paraspinal muscles appear normal. There is mild subcutaneous edema in the lumbar spine, significance doubtful in this setting.  Much of the abdominal viscera are obscured on these images.  T11-T12: Grossly normal.  T12-L1:  Negative.  L1-L2:  Negative.  L2-L3:  Negative.  L3-L4: Mild disc space loss and disc desiccation. Mild circumferential disc osteophyte complex. Mild facet hypertrophy. No significant stenosis.  L4-L5:  Negative except for mild facet hypertrophy.  L5-S1: Disc space loss with disc desiccation and right eccentric circumferential disc osteophyte complex. Focal central component (series 6, image 31), but no spinal or definite lateral recess stenosis. No foraminal stenosis.  IMPRESSION: 1. Diffuse loss of normal bone marrow signal, but may be post treatment affect rather than due to widespread osseous metastatic disease. No destructive osseous lesion identified. 2. No evidence of lumbar discitis osteomyelitis. Mild to moderate chronic appearing disc degeneration at L3-L4 and L5-S1. No spinal stenosis. 3. Mild signal abnormality in the right lower lumbar erector spinae musculature, nonspecific. Early infectious myositis is not excluded in this setting.   Electronically Signed   By: Lars Pinks M.D.   On: 11/15/2013 20:30   Ct Abdomen Pelvis W Contrast  11/10/2013   CLINICAL DATA:  Persistent abdominal pain.  EXAM: CT ABDOMEN AND PELVIS WITH CONTRAST  TECHNIQUE: Multidetector CT imaging of the abdomen and pelvis was performed using the standard protocol following bolus administration of intravenous contrast.  CONTRAST:  17mL OMNIPAQUE IOHEXOL 300 MG/ML  SOLN  COMPARISON:  CT of the abdomen and pelvis 10/22/2013.  FINDINGS: Lung Bases: Linear opacities throughout the  lung bases bilaterally, predominantly in a peribronchovascular distribution, favored to represent areas of peribronchial inflammation, potentially related to recent recurrent bouts of aspiration. Markedly thickened distal esophagus. Numerous serpiginous structures adjacent to the distal esophagus, presumably esophageal varices. Tip of a porta cath in the right atrium.  Abdomen/Pelvis: The liver has a shrunken appearance and nodular contour, compatible with advanced cirrhosis. No definite focal hepatic lesions are noted on today's limited noncontrast CT examination. Numerous surgical clips are noted adjacent to the liver. Status post cholecystectomy. Potential stone or migrated surgical clip in the proximal aspect of the common bile duct demonstrated on images 19 and 20 of series 2, unchanged. The portal vein is dilated measuring 18 mm. The spleen is massively enlarged measuring 18.6 x 14.1 x 17.7 cm (estimated splenic volume of 2,321 mL).  New compared to the recent prior examination there is marked thickening of the colonic wall involving the cecum and ascending colon. These findings are concerning for colitis. More distal aspect of the colon appears uninvolved. No significant volume of ascites. No pneumoperitoneum. No pathologic distention of small bowel. No definite lymphadenopathy identified within the abdomen or pelvis on today's non contrast CT examination. Prostate gland and urinary bladder are unremarkable in appearance. Small umbilical hernia containing only omental fat incidentally noted (unchanged).  Musculoskeletal: Multifocal sclerotic lesions are noted throughout the visualized axial and appendicular skeleton, most apparent within the sacrum at the level of S1, in the right side of the L5 vertebral body,  and scattered throughout the remainder of the spine. These findings are similar to prior examinations. At T10 and T11 there is mild compression which is unchanged, with approximately 15-20% loss of  height anteriorly at both vertebral body levels. Edema throughout the subcutaneous fat of the flanks bilaterally (right greater than left).  IMPRESSION: 1. Interval development of extensive colonic wall thickening involving the cecum and ascending colon, concerning for colitis. Clinical correlation is recommended. 2. Stigmata of cirrhosis and portal hypertension, including massive splenomegaly and esophageal varices redemonstrated, as above. 3. The appearance of the lung bases suggest sequela of multiple recent bouts of aspiration. 4. Small umbilical hernia containing only omental fat incidentally noted. 5. 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 6. Additional incidental findings, as above.   Electronically Signed   By: Vinnie Langton M.D.   On: 11/10/2013 19:56   Ct Abdomen Pelvis W Contrast  11/07/2013   CLINICAL DATA:  Abdominal pain  EXAM: CT ABDOMEN AND PELVIS WITH CONTRAST  TECHNIQUE: Multidetector CT imaging of the abdomen and pelvis was performed using the standard protocol following bolus administration of intravenous contrast.  CONTRAST:  72mL OMNIPAQUE IOHEXOL 300 MG/ML  SOLN  COMPARISON:  10/22/2013  FINDINGS: Cirrhotic liver.  Portal vein is grossly patent.  Bibasilar atelectasis.  Splenomegaly.  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  Right pleural effusion and abdominal ascites have resolved. Trace amount of fluid is layering in the pelvis.  Pancreas, kidneys, adrenal glands are within normal limits.  Umbilical hernia containing adipose tissue is stable.  There are areas of wall thickening in the colon. This occurs in the ascending colon on image 53, transverse colon on image 39, and possibly within the sigmoid colon, although the sigmoid colon is decompressed.  Bladder and prostate are unremarkable.  Sclerotic changes within thoracic and lumbar vertebral bodies is not  significantly changed. This may be associated with the patient's known history of lymphoma. Lytic areas in the iliac bones are also noted.  IMPRESSION: Cirrhotic liver.  Stable splenomegaly.  There are areas of wall thickening throughout the colon. Differential diagnosis includes an inflammatory process or malignancy.  Pleural effusions and abdominal ascites have resolved. Trace fluid persist in the pelvis.   Electronically Signed   By: Maryclare Bean M.D.   On: 11/07/2013 16:11   Ct Abdomen Pelvis W Contrast  10/22/2013   CLINICAL DATA:  Lymphoma, prior cholecystectomy and appendectomy. Status post bone marrow transplant. Abdominal pain and lactic acidosis.  EXAM: CT ABDOMEN AND PELVIS WITH CONTRAST  TECHNIQUE: Multidetector CT imaging of the abdomen and pelvis was performed using the standard protocol following bolus administration of intravenous contrast.  CONTRAST:  186mL OMNIPAQUE IOHEXOL 300 MG/ML  SOLN  COMPARISON:  CT ABD - PELV W/ CM dated 10/16/2013  FINDINGS: Trace pleural effusions are increased since previously, with bibasilar dependent atelectasis.  Splenomegaly is reidentified. Nodular hepatic contour reidentified with abdominal ascites in all 4 quadrants. Interval increase in soft tissue edema compatible with third spacing. Mild irregularity of the omental fat adjacent to the anterior segment right hepatic lobe with adjacent surgical clips noted, image 14. Upper abdominal vascular collaterals likely indicate portal hypertension. Portal vein not visualized, which may indicate chronic thrombosis.  Fat containing umbilical hernia noted. Foci of gas within the anterior abdominal wall subcutaneous fat likely indicating injection locations. Improvement in colonic wall thickening is identified. Adrenal glands, spleen, and kidneys are unremarkable. Cholecystectomy clips  are noted. No free air. Small retroperitoneal nodes are reidentified, largest 5 mm in the aortocaval space for example image 33. Bladder is  normal. Multiple axial sclerotic osseous lesions are reidentified.  IMPRESSION: Although colonic bowel wall thickening has improved since the previous exam, there has been interval worsening of ascites, pleural effusions, and subcutaneous soft tissue edema. 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  Right upper quadrant omental nodularity. Omental disease could have this appearance although postsurgical change is also possible given the presence of clips in this area.  Axial skeletal sclerotic lesions suspicious for metastatic disease.   Electronically Signed   By: Conchita Paris M.D.   On: 10/22/2013 13:04   Ir Removal Tun Access W/ Port W/o Fl Mod Sed  11/16/2013   CLINICAL DATA:  History of bacteremia, concern for seeding of patient's chronic Port a Catheter.  EXAM: REMOVAL OF IMPLANTED TUNNELED PORT-A-CATH  MEDICATIONS: The patient is currently admitted to the hospital and receiving intravenous antibiotics; The antibiotic was administered within 1 hour prior to the start of the procedure.  ANESTHESIA/SEDATION: Intravenous Versed, Fentanyl and Dilaudid was administered for conscious sedation  Sedation time: 20  FLUOROSCOPY TIME:  None  PROCEDURE: Informed written consent was obtained from the patient after a discussion of the risk, benefits and alternatives to the procedure. The patient was positioned supine on the fluoroscopy table and the right chest Port-A-Cath site was prepped with chlorhexidine. A sterile gown and gloves were worn during the procedure. Local anesthesia was provided with 1% lidocaine with epinephrine. A timeout was performed prior to the initiation of the procedure.  An incision was made overlying the Port-A-Cath with a #15 scalpel. Utilizing sharp and blunt dissection, the Port-A-Cath was removed completely. The pocked was irrigated with sterile saline. Wound closure was  performed with subcutaneous 3-0 Monocryl, subcuticular 4-0 Vicryl and Dermabond. A dressing was placed. The patient tolerated the procedure well without immediate post procedural complication.  FINDINGS: Successful removal of implant Port-A-Cath without immediate post procedural complication.  Visual inspection of the Port a catheter pocket was negative for any definitive evidence of infection and as such, the incision was closed primarily with absorbable suture as above.  IMPRESSION: Successful removal of implanted Port-A-Cath.   Electronically Signed   By: Sandi Mariscal M.D.   On: 11/16/2013 16:57   Dg Abd Acute W/chest  11/06/2013   CLINICAL DATA:  Fever and emesis ; lymphoma  EXAM: ACUTE ABDOMEN SERIES (ABDOMEN 2 VIEW & CHEST 1 VIEW)  COMPARISON:  Chest radiograph Oct 16, 2013; CT abdomen and pelvis Oct 22, 2013  FINDINGS: PA chest: There is no edema or consolidation. Heart size and pulmonary vascularity are normal. No adenopathy. Port-A-Cath tip is at the cavoatrial junction. No pneumothorax.  Supine and upright abdomen: There remains thickening of scattered bowel loops. There are scattered air-fluid levels. There is no well-defined obstruction or free air. There is moderate stool in the colon. There are surgical clips in the right upper quadrant.  IMPRESSION: There remains some wall thickening of several loops of bowel which given the history, may have neoplastic etiology. Scattered air-fluid levels may represent ileus or enteritis. Early obstruction is a consideration, although enteritis or ileus with tend to be more likely given the overall appearance of the bowel. No free air is seen.  There is no lung edema or consolidation.   Electronically Signed   By: Lowella Grip  M.D.   On: 11/06/2013 11:22    Objective: Filed Vitals:   11/23/13 1630 11/23/13 2137 11/24/13 0613 11/24/13 1357  BP:  113/55 108/57 108/55  Pulse:  91 81 90  Temp:  98.7 F (37.1 C) 98.4 F (36.9 C) 98.7 F (37.1 C)    TempSrc:  Oral Oral Oral  Resp:  20 20 20   Height:      Weight: 99.111 kg (218 lb 8 oz)     SpO2:  96% 98% 98%    Intake/Output Summary (Last 24 hours) at 11/24/13 1631 Last data filed at 11/24/13 1433  Gross per 24 hour  Intake   1200 ml  Output   2400 ml  Net  -1200 ml   Weight change:  Exam:   General:  Pt is alert, follows commands appropriately, not in acute distress  HEENT: No icterus, No thrush,  [place]/AT  Cardiovascular: RRR, S1/S2, no rubs, no gallops  Respiratory: CTA bilaterally, no wheezing, no crackles, no rhonchi  Abdomen: Soft/+BS, diffusely tender without any peritoneal signs, non distended, no guarding--anasarca  Extremities: no LE edema, No lymphangitis, No petechiae, No rashes, no synovitis  Back: tender on right lower back/ buttock and thigh  Data Reviewed: Basic Metabolic Panel:  Recent Labs Lab 11/18/13 0405 11/19/13 0418 11/20/13 0659 11/21/13 0437 11/22/13 0530  NA 136* 138 138 136* 137  K 3.8 3.5* 3.8 3.9 3.8  CL 105 102 104 98 100  CO2 22 24 26 27 30   GLUCOSE 126* 86 131* 77 90  BUN 6 6 7 7 7   CREATININE 0.60 0.62 0.64 0.63 0.62  CALCIUM 8.0* 8.1* 8.2* 8.5 8.3*  MG 1.6  --   --   --   --    Liver Function Tests:  Recent Labs Lab 11/20/13 0659  AST 29  ALT 16  ALKPHOS 69  BILITOT 2.3*  PROT 5.9*  ALBUMIN 2.0*   No results found for this basename: LIPASE, AMYLASE,  in the last 168 hours No results found for this basename: AMMONIA,  in the last 168 hours CBC: No results found for this basename: WBC, NEUTROABS, HGB, HCT, MCV, PLT,  in the last 168 hours Cardiac Enzymes:  Recent Labs Lab 11/17/13 1647 11/22/13 0530  CKTOTAL 55 44   BNP: No components found with this basename: POCBNP,  CBG: No results found for this basename: GLUCAP,  in the last 168 hours  Recent Results (from the past 240 hour(s))  CULTURE, BLOOD (ROUTINE X 2)     Status: None   Collection Time    11/17/13  1:00 PM      Result Value Ref Range  Status   Specimen Description BLOOD LEFT ANTECUBITAL   Final   Special Requests BOTTLES DRAWN AEROBIC AND ANAEROBIC 10CC   Final   Culture  Setup Time     Final   Value: 11/17/2013 16:48     Performed at [facility]   Culture     Final   Value: NO GROWTH 5 DAYS     Performed at [facility]   Report Status 11/23/2013 FINAL   Final  CULTURE, BLOOD (ROUTINE X 2)     Status: None   Collection Time    11/17/13  1:10 PM      Result Value Ref Range Status   Specimen Description BLOOD RIGHT HAND   Final   Special Requests BOTTLES DRAWN AEROBIC ONLY [place]   Final   Culture  Setup Time  Final   Value: 11/17/2013 16:48     Performed at [facility]   Culture     Final   Value: NO GROWTH 5 DAYS     Performed at [facility]   Report Status 11/23/2013 FINAL   Final     Scheduled Meds: . atovaquone  1,500 mg Oral Daily  . azithromycin  1,200 mg Oral Weekly  . cyclobenzaprine  5 mg Oral QHS  . DAPTOmycin (CUBICIN)  IV  750 mg Intravenous Q24H  . Darunavir Ethanolate  800 mg Oral Q breakfast  . diclofenac sodium  4 g Topical TID AC & HS  . diphenoxylate-atropine  2 tablet Oral TID  . emtricitabine-tenofovir  1 tablet Oral QHS  . feeding supplement (RESOURCE BREEZE)  1 Container Oral TID BM  . fluconazole (DIFLUCAN) IV  400 mg Intravenous Q24H  . furosemide  40 mg Oral Daily  . morphine  30 mg Oral BID  . multivitamin with minerals  1 tablet Oral Daily  . nystatin  5 mL Oral QID  . pantoprazole  40 mg Oral BID  . phytonadione  10 mg Oral Daily  . ritonavir  100 mg Oral Q breakfast  . spironolactone  100 mg Oral Daily  . sucralfate  1 g Oral TID WC   Continuous Infusions:    Debbe Odea, MD [facility] If 7PM-7AM, please contact night-coverage [email or web address]  11/24/2013, 4:31 PM   LOS: 18 days

## 2013-11-25 LAB — PROTIME-INR
INR: 1.85 — ABNORMAL HIGH (ref 0.00–1.49)
Prothrombin Time: 20.8 seconds — ABNORMAL HIGH (ref 11.6–15.2)

## 2013-11-25 MED ORDER — HEPARIN SOD (PORK) LOCK FLUSH 100 UNIT/ML IV SOLN
250.0000 [IU] | INTRAVENOUS | Status: AC | PRN
  Administered 2013-11-25: 250 [IU]

## 2013-11-25 NOTE — Progress Notes (Addendum)
Patient evaluated this AM. He is stable to go to rehab today- d/c summary updated. Needs to f/u with ID- dr Baxter Flattery states that her clinic with arrange the f/u appt

## 2013-11-25 NOTE — Progress Notes (Signed)
Pt discharged to [facility] via [place].  Pt has capped Right PICC line.  Report has been given to [employer].  All paperwork completed to send and my # given as a reference if Maudie Mercury has any questions once pt arrives.  FU appts given and explained.  Rx sent.

## 2013-11-25 NOTE — Progress Notes (Signed)
Pt ready for discharge to [facility] this afternoon via [facility].  DC instructions printed.  IV team called to cap PICC line for transport.  Pt has received his IV antibiotics for today.

## 2013-11-25 NOTE — Progress Notes (Signed)
Report given to Samuel Schultz at [facility] where pt is to be transported via [facility].  Will send some Resources with pt to the facility.

## 2013-11-26 NOTE — Clinical Social Work Placement (Signed)
Clinical Social Work Department CLINICAL SOCIAL WORK PLACEMENT NOTE 11/26/2013  Patient:  KAELAN, AMBLE  Account Number:  [id number] Admit date:  11/06/2013  Clinical Social Worker:  Daiva Huge  Date/time:  11/14/2013 02:09 PM  Clinical Social Work is seeking post-discharge placement for this patient at the following level of care:   [place]   (*CSW will update this form in Epic as items are completed)   11/14/2013  Patient/family provided with [facility] Department of Clinical Social Work's list of facilities offering this level of care within the geographic area requested by the patient (or if unable, by the patient's family).  11/14/2013  Patient/family informed of their freedom to choose among providers that offer the needed level of care, that participate in Medicare, Medicaid or managed care program needed by the patient, have an available bed and are willing to accept the patient.  11/14/2013  Patient/family informed of [facility]' ownership interest in [facility], as well as of the fact that they are under no obligation to receive care at this facility.  PASARR submitted to EDS on 11/14/2013 PASARR number received on   FL2 transmitted to all facilities in geographic area requested by pt/family on  11/14/2013 FL2 transmitted to all facilities within larger geographic area on   Patient informed that his/her managed care company has contracts with or will negotiate with  certain facilities, including the following:     Patient/family informed of bed offers received:  11/24/2013 Patient chooses bed at [facility] Physician recommends and patient chooses bed at    Patient to be transferred to [facility] on  11/25/2013 Patient to be transferred to facility by EMS Patient and family notified of transfer on 11/25/2013 Name of family member notified:  PT REPORTS NO FAMILY INVOLVEMENT- ANTHONY FROM [facility]  The following physician  request were entered in Epic:   Additional Comments: Eduard Clos, MSW, [place]

## 2013-11-29 LAB — AFB CULTURE, BLOOD

## 2013-12-02 LAB — CBC WITH DIFFERENTIAL/PLATELET
BASOS PCT: 1.1 %
Basophil #: 0 10*3/uL (ref 0.0–0.1)
EOS ABS: 0.3 10*3/uL (ref 0.0–0.7)
Eosinophil %: 8.5 %
HCT: 33.5 % — AB (ref 40.0–52.0)
HGB: 11.7 g/dL — AB (ref 13.0–18.0)
LYMPHS PCT: 36.7 %
Lymphocyte #: 1.1 10*3/uL (ref 1.0–3.6)
MCH: 37.5 pg — ABNORMAL HIGH (ref 26.0–34.0)
MCHC: 34.9 g/dL (ref 32.0–36.0)
MCV: 108 fL — ABNORMAL HIGH (ref 80–100)
MONO ABS: 0.4 x10 3/mm (ref 0.2–1.0)
MONOS PCT: 14.3 %
Neutrophil #: 1.2 10*3/uL — ABNORMAL LOW (ref 1.4–6.5)
Neutrophil %: 39.4 %
PLATELETS: 112 10*3/uL — AB (ref 150–440)
RBC: 3.11 10*6/uL — ABNORMAL LOW (ref 4.40–5.90)
RDW: 17.5 % — AB (ref 11.5–14.5)
WBC: 2.9 10*3/uL — AB (ref 3.8–10.6)

## 2013-12-02 LAB — BASIC METABOLIC PANEL
Anion Gap: 4 — ABNORMAL LOW (ref 7–16)
BUN: 12 mg/dL (ref 7–18)
CALCIUM: 9 mg/dL (ref 8.5–10.1)
CHLORIDE: 98 mmol/L (ref 98–107)
CO2: 30 mmol/L (ref 21–32)
CREATININE: 0.9 mg/dL (ref 0.60–1.30)
EGFR (Non-African Amer.): >60
Glucose: 85 mg/dL (ref 65–99)
Osmolality: 264 (ref 275–301)
Potassium: 4.3 mmol/L (ref 3.5–5.1)
Sodium: 132 mmol/L — ABNORMAL LOW (ref 136–145)

## 2013-12-05 ENCOUNTER — Encounter: Payer: Self-pay | Admitting: Hematology and Oncology

## 2013-12-05 ENCOUNTER — Ambulatory Visit: Payer: Medicaid Other

## 2013-12-05 ENCOUNTER — Ambulatory Visit (HOSPITAL_BASED_OUTPATIENT_CLINIC_OR_DEPARTMENT_OTHER): Payer: Medicaid Other | Admitting: Hematology and Oncology

## 2013-12-05 VITALS — BP 113/73 | HR 106 | Temp 97.5°F | Resp 18 | Ht 69.0 in | Wt 193.3 lb

## 2013-12-05 DIAGNOSIS — B20 Human immunodeficiency virus [HIV] disease: Secondary | ICD-10-CM

## 2013-12-05 DIAGNOSIS — K5289 Other specified noninfective gastroenteritis and colitis: Secondary | ICD-10-CM

## 2013-12-05 DIAGNOSIS — D61818 Other pancytopenia: Secondary | ICD-10-CM

## 2013-12-05 DIAGNOSIS — C8580 Other specified types of non-Hodgkin lymphoma, unspecified site: Secondary | ICD-10-CM

## 2013-12-05 DIAGNOSIS — K529 Noninfective gastroenteritis and colitis, unspecified: Secondary | ICD-10-CM

## 2013-12-05 DIAGNOSIS — Z87898 Personal history of other specified conditions: Secondary | ICD-10-CM

## 2013-12-05 NOTE — Progress Notes (Signed)
Checked in new patient with no issues at this time prior to seeing the dr. He didn't 'have card and not sure if medicaid or [place] access.

## 2013-12-05 NOTE — Assessment & Plan Note (Signed)
I reviewed outside records extensively. He was originally diagnosed in 2008 and received R. CHOP chemotherapy along with intrathecal chemotherapy. According to outside records, the disease was "everywhere". He was originally treated at [facility] but has been seen at [facility]. His last bone marrow biopsy done recently at [facility] show no evidence of recurrence. CT scan at [facility] showed nonspecific enteritis but no evidence of relapse of lymphoma. I recommend followup closer to home in [place] and would refer him to [facility] for future followup.

## 2013-12-05 NOTE — Assessment & Plan Note (Addendum)
He will continue on antiretroviral treatment.

## 2013-12-05 NOTE — Assessment & Plan Note (Signed)
This is multifactorial related to his HIV disease and severe liver cirrhosis. He has lab drawn at the nursing home today. I would defer his HIV treatment to his infectious disease physician. As long as the patient has no signs of bleeding, I would not recommend platelet transfusion unless it dropped to less than 10,000. Unless the patient has symptoms of severe anemia, I would not recommend blood transfusion unless it dropped to less than 7 g.

## 2013-12-05 NOTE — Assessment & Plan Note (Signed)
He had recent enteritis and was treated with broad-spectrum antimicrobial therapy. He had diarrhea 3 times a day. Recommend increase oral fluid intake.

## 2013-12-05 NOTE — Progress Notes (Signed)
[facility] NOTE  Patient Care Team: [name], MD as PCP - General (Internal Medicine) Pcp Not In System  CHIEF COMPLAINTS/PURPOSE OF CONSULTATION:  History of lymphoma and pancytopenia  HISTORY OF PRESENTING ILLNESS:  Samuel Schultz 38 y.o. male is here because of history of lymphoma. The patient himself is a poor historian. A review of records from [place], [place] and [place]. 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 CHOP chemotherapy along with intrathecal chemotherapy in 2008. 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 He was admitted to the hospital recently for severe diarrhea and pancytopenia. Currently, he is residing in a skilled nursing facility and will stay there permanently due to chronic disability. The patient denies any recent signs or symptoms of bleeding such as spontaneous epistaxis, hematuria or hematochezia. He denies any recent fever, chills, night sweats or abnormal weight loss He has chronic diarrhea. He has chronic weakness and is wheelchair bound most of the time.  MEDICAL HISTORY:  Past Medical History  Diagnosis Date  . Cancer   . Lymphoma   . HIV disease   . Cirrhosis   . PTSD (post-traumatic stress disorder)   . Depressive disorder     SURGICAL HISTORY: Past Surgical History  Procedure Laterality Date  . Cholecystectomy    . Appendectomy    . Tee without cardioversion N/A 11/13/2013    Procedure: TRANSESOPHAGEAL ECHOCARDIOGRAM (TEE);  Surgeon: Dorothy Spark, MD;  Location: [facility] ENDOSCOPY;  Service: Cardiovascular;  Laterality: N/A;    SOCIAL HISTORY: History   Social History  . Marital Status: Single    Spouse Name: N/A    Number of Children: N/A  . Years of Education: N/A   Occupational History  . Not on file.   Social History Main Topics  . Smoking status:  Never Smoker   . Smokeless tobacco: Never Used  . Alcohol Use: No  . Drug Use: No  . Sexual Activity: Not on file   Other Topics Concern  . Not on file   Social History Narrative  . No narrative on file    FAMILY HISTORY: Family History  Problem Relation Age of Onset  . Cancer Maternal Grandmother     breast ca  . Cancer Maternal Grandfather     colon ca    ALLERGIES:  is allergic to codeine.  MEDICATIONS:  Current Outpatient Prescriptions  Medication Sig Dispense Refill  . atovaquone (MEPRON) 750 MG/5ML suspension Take 1,500 mg by mouth daily.      Marland Kitchen azithromycin (ZITHROMAX) 600 MG tablet Take 2 tablets (1,200 mg total) by mouth once a week. Next dose on 11/24/13  8 tablet  0  . cyclobenzaprine (FLEXERIL) 5 MG tablet Take 1 tablet (5 mg total) by mouth at bedtime.  30 tablet  0  . Darunavir Ethanolate (PREZISTA) 800 MG tablet Take 800 mg by mouth daily.      . diphenoxylate-atropine (LOMOTIL) 2.5-0.025 MG per tablet Take 2 tablets by mouth 3 (three) times daily.  180 tablet  0  . emtricitabine-tenofovir (TRUVADA) 200-300 MG per tablet Take 1 tablet by mouth at bedtime.      Marland Kitchen escitalopram (LEXAPRO) 20 MG tablet Take 20 mg by mouth daily.      . feeding supplement, RESOURCE BREEZE, (RESOURCE BREEZE) LIQD Take 1 Container by mouth 3 (three) times daily between meals.  0  . gabapentin (NEURONTIN) 300 MG capsule Take 300 mg by mouth 3 (three) times daily.      Marland Kitchen morphine (MS CONTIN) 30 MG 12 hr tablet Take 1 tablet (30 mg total) by mouth 2 (two) times daily.  60 tablet  0  . pantoprazole (PROTONIX) 40 MG tablet Take 40 mg by mouth 2 (two) times daily.      . ritonavir (NORVIR) 100 MG capsule Take 100 mg by mouth daily with breakfast.      . spironolactone (ALDACTONE) 50 MG tablet Take 50 mg by mouth daily.      . sucralfate (CARAFATE) 1 G tablet Take 1 g by mouth 3 (three) times daily with meals.       . furosemide (LASIX) 20 MG tablet Take 20 mg by mouth daily.       No  current facility-administered medications for this visit.    REVIEW OF SYSTEMS:   Constitutional: Denies fevers, chills or abnormal night sweats Eyes: Denies blurriness of vision, double vision or watery eyes Ears, nose, mouth, throat, and face: Denies mucositis or sore throat Respiratory: Denies cough, dyspnea or wheezes Cardiovascular: Denies palpitation, chest discomfort or lower extremity swelling Skin: Denies abnormal skin rashes Lymphatics: Denies new lymphadenopathy Behavioral/Psych: Mood is stable, no new changes  All other systems were reviewed with the patient and are negative.  PHYSICAL EXAMINATION: ECOG PERFORMANCE STATUS: 3 - Symptomatic, >50% confined to bed  Filed Vitals:   12/05/13 1414  BP: 113/73  Pulse: 106  Temp: 97.5 F (36.4 C)  Resp: 18   Filed Weights   12/05/13 1414  Weight: 193 lb 4.8 oz (87.68 kg)    GENERAL:alert, no distress and comfortable. He looks pale and debilitated sitting on the wheelchair SKIN: skin color, texture, turgor are normal, no rashes or significant lesions EYES: normal, conjunctiva are pale and non-injected, sclera clear OROPHARYNX:no exudate, no erythema and lips, buccal mucosa, and tongue normal . No thrush NECK: supple, thyroid normal size, non-tender, without nodularity LYMPH:  no palpable lymphadenopathy in the cervical, axillary or inguinal LUNGS: clear to auscultation and percussion with normal breathing effort HEART: regular rate & rhythm and no murmurs and no lower extremity edema ABDOMEN:abdomen soft, non-tender and normal bowel sounds Musculoskeletal:no cyanosis of digits and no clubbing  PSYCH: alert & oriented x 3 with fluent speech NEURO: no focal motor/sensory deficits  LABORATORY DATA:  I have reviewed the data as listed Lab Results  Component Value Date   WBC 2.1* 11/16/2013   HGB 8.2* 11/16/2013   HCT 23.6* 11/16/2013   MCV 105.4* 11/16/2013   PLT 37* 11/16/2013    Recent Labs  10/16/13 1149   10/22/13 1042 11/06/13 1020 11/08/13 0940  11/20/13 0659 11/21/13 0437 11/22/13 0530  NA 141  < >  --  139 134*  < > 138 136* 137  K 3.2*  < >  --  3.7 3.9  < > 3.8 3.9 3.8  CL 109  < >  --  108 107  < > 104 98 100  CO2 21  < >  --  20 14*  < > 26 27 30   GLUCOSE 91  < >  --  85 160*  < > 131* 77 90  BUN 7  < >  --  5* 11  < > 7 7 7   CREATININE 0.71  < >  --  0.58 1.81*  < > 0.64 0.63 0.62  CALCIUM 8.5  < >  --  8.6 8.2*  < > 8.2* 8.5 8.3*  GFRNONAA >90  < >  --  >90 46*  < > >90 >90 >90  GFRAA >90  < >  --  >90 53*  < > >90 >90 >90  PROT 6.5  --  6.0 7.2 5.9*  --  5.9*  --   --   ALBUMIN 2.6*  --  2.4* 2.7* 2.1*  --  2.0*  --   --   AST 45*  --  45* 46* 52*  --  29  --   --   ALT 23  --  21 22 18   --  16  --   --   ALKPHOS 92  --  100 115 81  --  69  --   --   BILITOT 3.3*  --  1.7* 3.4* 1.8*  --  2.3*  --   --   BILIDIR  --   --  0.5*  --   --   --   --   --   --   IBILI  --   --  1.2*  --   --   --   --   --   --   < > = values in this interval not displayed.  RADIOGRAPHIC STUDIES: I have personally reviewed the radiological images as listed and agreed with the findings in the report. Mr Lumbar Spine W Wo Contrast  11/15/2013   CLINICAL DATA:  38 year old male with bacteremia and new back pain. Severe low back pain radiating to the right lower extremity. Initial encounter. HIV, cirrhosis, lymphoma.  EXAM: MRI LUMBAR SPINE WITHOUT AND WITH CONTRAST  TECHNIQUE: Multiplanar and multiecho pulse sequences of the lumbar spine were obtained without and with intravenous contrast.  CONTRAST:  20m MULTIHANCE GADOBENATE DIMEGLUMINE 529 MG/ML IV SOLN  COMPARISON:  CT Abdomen and Pelvis 11/10/2013 and earlier.  FINDINGS: Normal lumbar segmentation depicted on comparison.  Diffuse loss of normal bone marrow signal in the visible spine, and left iliac bone. However, no marrow edema or destructive osseous lesion identified. No abnormal vertebral enhancement in the spine.  Visualized lower thoracic  spinal cord is normal with conus medularis at L1. Normal cauda equina nerve roots. No abnormal intradural enhancement.  There is mild abnormal STIR and T2 signal in the lower right erector spinae muscles, at the L4 and L5 level (series 5, image 1 and series 6, image 26). This is nonspecific. No definite abnormal enhancement, and other visible paraspinal muscles appear normal. There is mild subcutaneous edema in the lumbar spine, significance doubtful in this setting.  Much of the abdominal viscera are obscured on these images.  T11-T12: Grossly normal.  T12-L1:  Negative.  L1-L2:  Negative.  L2-L3:  Negative.  L3-L4: Mild disc space loss and disc desiccation. Mild circumferential disc osteophyte complex. Mild facet hypertrophy. No significant stenosis.  L4-L5:  Negative except for mild facet hypertrophy.  L5-S1: Disc space loss with disc desiccation and right eccentric circumferential disc osteophyte complex. Focal central component (series 6, image 31), but no spinal or definite lateral recess stenosis. No foraminal stenosis.  IMPRESSION: 1. Diffuse loss of normal bone marrow signal, but may be post treatment affect rather than due to widespread osseous metastatic disease. No destructive osseous lesion identified. 2. No evidence of lumbar discitis osteomyelitis. Mild to moderate chronic appearing disc degeneration at L3-L4 and L5-S1. No spinal stenosis. 3. Mild signal abnormality in the right lower lumbar erector spinae musculature, nonspecific. Early infectious  myositis is not excluded in this setting.   Electronically Signed   By: Lars Pinks M.D.   On: 11/15/2013 20:30   Ct Abdomen Pelvis W Contrast  11/10/2013   CLINICAL DATA:  Persistent abdominal pain.  EXAM: CT ABDOMEN AND PELVIS WITH CONTRAST  TECHNIQUE: Multidetector CT imaging of the abdomen and pelvis was performed using the standard protocol following bolus administration of intravenous contrast.  CONTRAST:  176m OMNIPAQUE IOHEXOL 300 MG/ML  SOLN   COMPARISON:  CT of the abdomen and pelvis 10/22/2013.  FINDINGS: Lung Bases: Linear opacities throughout the lung bases bilaterally, predominantly in a peribronchovascular distribution, favored to represent areas of peribronchial inflammation, potentially related to recent recurrent bouts of aspiration. Markedly thickened distal esophagus. Numerous serpiginous structures adjacent to the distal esophagus, presumably esophageal varices. Tip of a porta cath in the right atrium.  Abdomen/Pelvis: The liver has a shrunken appearance and nodular contour, compatible with advanced cirrhosis. No definite focal hepatic lesions are noted on today's limited noncontrast CT examination. Numerous surgical clips are noted adjacent to the liver. Status post cholecystectomy. Potential stone or migrated surgical clip in the proximal aspect of the common bile duct demonstrated on images 19 and 20 of series 2, unchanged. The portal vein is dilated measuring 18 mm. The spleen is massively enlarged measuring 18.6 x 14.1 x 17.7 cm (estimated splenic volume of 2,321 mL).  New compared to the recent prior examination there is marked thickening of the colonic wall involving the cecum and ascending colon. These findings are concerning for colitis. More distal aspect of the colon appears uninvolved. No significant volume of ascites. No pneumoperitoneum. No pathologic distention of small bowel. No definite lymphadenopathy identified within the abdomen or pelvis on today's non contrast CT examination. Prostate gland and urinary bladder are unremarkable in appearance. Small umbilical hernia containing only omental fat incidentally noted (unchanged).  Musculoskeletal: Multifocal sclerotic lesions are noted throughout the visualized axial and appendicular skeleton, most apparent within the sacrum at the level of S1, in the right side of the L5 vertebral body, and scattered throughout the remainder of the spine. These findings are similar to prior  examinations. At T10 and T11 there is mild compression which is unchanged, with approximately 15-20% loss of height anteriorly at both vertebral body levels. Edema throughout the subcutaneous fat of the flanks bilaterally (right greater than left).  IMPRESSION: 1. Interval development of extensive colonic wall thickening involving the cecum and ascending colon, concerning for colitis. Clinical correlation is recommended. 2. Stigmata of cirrhosis and portal hypertension, including massive splenomegaly and esophageal varices redemonstrated, as above. 3. The appearance of the lung bases suggest sequela of multiple recent bouts of aspiration. 4. Small umbilical hernia containing only omental fat incidentally noted. 5. 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 6. Additional incidental findings, as above.   Electronically Signed   By: DVinnie LangtonM.D.   On: 11/10/2013 19:56   Ct Abdomen Pelvis W Contrast  11/07/2013   CLINICAL DATA:  Abdominal pain  EXAM: CT ABDOMEN AND PELVIS WITH CONTRAST  TECHNIQUE: Multidetector CT imaging of the abdomen and pelvis was performed using the standard protocol following bolus administration of intravenous contrast.  CONTRAST:  869mOMNIPAQUE IOHEXOL 300 MG/ML  SOLN  COMPARISON:  10/22/2013  FINDINGS: Cirrhotic liver.  Portal vein is grossly patent.  Bibasilar atelectasis.  Splenomegaly.  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  Right pleural effusion and  abdominal ascites have resolved. Trace amount of fluid is layering in the pelvis.  Pancreas, kidneys, adrenal glands are within normal limits.  Umbilical hernia containing adipose tissue is stable.  There are areas of wall thickening in the colon. This occurs in the ascending colon on image 53, transverse colon on image 39, and possibly within the sigmoid colon, although the sigmoid colon is decompressed.   Bladder and prostate are unremarkable.  Sclerotic changes within thoracic and lumbar vertebral bodies is not significantly changed. This may be associated with the patient's known history of lymphoma. Lytic areas in the iliac bones are also noted.  IMPRESSION: Cirrhotic liver.  Stable splenomegaly.  There are areas of wall thickening throughout the colon. Differential diagnosis includes an inflammatory process or malignancy.  Pleural effusions and abdominal ascites have resolved. Trace fluid persist in the pelvis.   Electronically Signed   By: Maryclare Bean M.D.   On: 11/07/2013 16:11   ASSESSMENT & PLAN:  Large cell lymphoma I reviewed outside records extensively. He was originally diagnosed in 2008 and received R. CHOP chemotherapy along with intrathecal chemotherapy. According to outside records, the disease was "everywhere". He was originally treated at [facility] but has been seen at [facility]. His last bone marrow biopsy done recently at [facility] show no evidence of recurrence. CT scan at [facility] showed nonspecific enteritis but no evidence of relapse of lymphoma. I recommend followup closer to home in [place] and would refer him to [facility] for future followup.  Pancytopenia This is multifactorial related to his HIV disease and severe liver cirrhosis. He has lab drawn at the nursing home today. I would defer his HIV treatment to his infectious disease physician. As long as the patient has no signs of bleeding, I would not recommend platelet transfusion unless it dropped to less than 10,000. Unless the patient has symptoms of severe anemia, I would not recommend blood transfusion unless it dropped to less than 7 g.  AIDS He will continue on antiretroviral treatment.  Enteritis He had recent enteritis and was treated with broad-spectrum antimicrobial therapy. He had diarrhea 3 times a day. Recommend increase oral fluid  intake.       All questions were answered. The patient knows to call the clinic with any problems, questions or concerns. I spent 40 minutes counseling the patient face to face. The total time spent in the appointment was 60 minutes and more than 50% was on counseling.     [facility], [place], MD 12/05/2013 3:56 PM

## 2013-12-06 ENCOUNTER — Encounter: Payer: Self-pay | Admitting: Internal Medicine

## 2013-12-06 ENCOUNTER — Telehealth: Payer: Self-pay | Admitting: Hematology and Oncology

## 2013-12-06 LAB — CULTURE, BLOOD (ROUTINE X 2)

## 2013-12-06 NOTE — Telephone Encounter (Signed)
, °

## 2013-12-10 ENCOUNTER — Emergency Department: Payer: Self-pay | Admitting: Emergency Medicine

## 2013-12-10 ENCOUNTER — Inpatient Hospital Stay (HOSPITAL_COMMUNITY)
Admission: AD | Admit: 2013-12-10 | Discharge: 2013-12-19 | DRG: 551 | Disposition: A | Discharge status: Skilled Nursing Facility | Payer: Medicaid Other | Source: Other Acute Inpatient Hospital | Attending: Internal Medicine | Admitting: Internal Medicine

## 2013-12-10 DIAGNOSIS — D61818 Other pancytopenia: Secondary | ICD-10-CM | POA: Diagnosis present

## 2013-12-10 DIAGNOSIS — M109 Gout, unspecified: Secondary | ICD-10-CM | POA: Diagnosis present

## 2013-12-10 DIAGNOSIS — B952 Enterococcus as the cause of diseases classified elsewhere: Secondary | ICD-10-CM

## 2013-12-10 DIAGNOSIS — B20 Human immunodeficiency virus [HIV] disease: Secondary | ICD-10-CM

## 2013-12-10 DIAGNOSIS — R609 Edema, unspecified: Secondary | ICD-10-CM | POA: Diagnosis present

## 2013-12-10 DIAGNOSIS — A498 Other bacterial infections of unspecified site: Secondary | ICD-10-CM

## 2013-12-10 DIAGNOSIS — Z6828 Body mass index (BMI) 28.0-28.9, adult: Secondary | ICD-10-CM | POA: Diagnosis not present

## 2013-12-10 DIAGNOSIS — E871 Hypo-osmolality and hyponatremia: Secondary | ICD-10-CM

## 2013-12-10 DIAGNOSIS — C8580 Other specified types of non-Hodgkin lymphoma, unspecified site: Secondary | ICD-10-CM

## 2013-12-10 DIAGNOSIS — F329 Major depressive disorder, single episode, unspecified: Secondary | ICD-10-CM | POA: Diagnosis present

## 2013-12-10 DIAGNOSIS — M79609 Pain in unspecified limb: Secondary | ICD-10-CM | POA: Diagnosis present

## 2013-12-10 DIAGNOSIS — F3289 Other specified depressive episodes: Secondary | ICD-10-CM | POA: Diagnosis present

## 2013-12-10 DIAGNOSIS — F4310 Post-traumatic stress disorder, unspecified: Secondary | ICD-10-CM | POA: Diagnosis present

## 2013-12-10 DIAGNOSIS — M869 Osteomyelitis, unspecified: Secondary | ICD-10-CM | POA: Diagnosis present

## 2013-12-10 DIAGNOSIS — Z885 Allergy status to narcotic agent status: Secondary | ICD-10-CM | POA: Diagnosis not present

## 2013-12-10 DIAGNOSIS — M4646 Discitis, unspecified, lumbar region: Secondary | ICD-10-CM

## 2013-12-10 DIAGNOSIS — E43 Unspecified severe protein-calorie malnutrition: Secondary | ICD-10-CM | POA: Diagnosis present

## 2013-12-10 DIAGNOSIS — M4640 Discitis, unspecified, site unspecified: Secondary | ICD-10-CM | POA: Diagnosis present

## 2013-12-10 DIAGNOSIS — M79675 Pain in left toe(s): Secondary | ICD-10-CM

## 2013-12-10 DIAGNOSIS — R112 Nausea with vomiting, unspecified: Secondary | ICD-10-CM

## 2013-12-10 DIAGNOSIS — E236 Other disorders of pituitary gland: Secondary | ICD-10-CM | POA: Diagnosis present

## 2013-12-10 DIAGNOSIS — K7460 Unspecified cirrhosis of liver: Secondary | ICD-10-CM

## 2013-12-10 DIAGNOSIS — E875 Hyperkalemia: Secondary | ICD-10-CM | POA: Diagnosis present

## 2013-12-10 DIAGNOSIS — M519 Unspecified thoracic, thoracolumbar and lumbosacral intervertebral disc disorder: Principal | ICD-10-CM | POA: Diagnosis present

## 2013-12-10 DIAGNOSIS — K529 Noninfective gastroenteritis and colitis, unspecified: Secondary | ICD-10-CM

## 2013-12-10 DIAGNOSIS — E440 Moderate protein-calorie malnutrition: Secondary | ICD-10-CM

## 2013-12-10 DIAGNOSIS — I472 Ventricular tachycardia: Secondary | ICD-10-CM

## 2013-12-10 DIAGNOSIS — C8589 Other specified types of non-Hodgkin lymphoma, extranodal and solid organ sites: Secondary | ICD-10-CM | POA: Diagnosis present

## 2013-12-10 DIAGNOSIS — I4729 Other ventricular tachycardia: Secondary | ICD-10-CM

## 2013-12-10 DIAGNOSIS — K5289 Other specified noninfective gastroenteritis and colitis: Secondary | ICD-10-CM

## 2013-12-10 DIAGNOSIS — N179 Acute kidney failure, unspecified: Secondary | ICD-10-CM

## 2013-12-10 DIAGNOSIS — Z79899 Other long term (current) drug therapy: Secondary | ICD-10-CM

## 2013-12-10 DIAGNOSIS — I959 Hypotension, unspecified: Secondary | ICD-10-CM

## 2013-12-10 DIAGNOSIS — E162 Hypoglycemia, unspecified: Secondary | ICD-10-CM

## 2013-12-10 DIAGNOSIS — M10271 Drug-induced gout, right ankle and foot: Secondary | ICD-10-CM

## 2013-12-10 DIAGNOSIS — D689 Coagulation defect, unspecified: Secondary | ICD-10-CM | POA: Diagnosis present

## 2013-12-10 DIAGNOSIS — C8590 Non-Hodgkin lymphoma, unspecified, unspecified site: Secondary | ICD-10-CM

## 2013-12-10 LAB — COMPREHENSIVE METABOLIC PANEL
ALBUMIN: 3 g/dL — AB (ref 3.4–5.0)
ALBUMIN: 2.8 g/dL — AB (ref 3.5–5.2)
ALT: 80 U/L — AB (ref 12–78)
ALT: 67 U/L — ABNORMAL HIGH (ref 0–53)
ANION GAP: 12 (ref 5–15)
AST: 92 U/L — AB (ref 15–37)
AST: 82 U/L — ABNORMAL HIGH (ref 0–37)
Alkaline Phosphatase: 145 U/L — ABNORMAL HIGH
Alkaline Phosphatase: 123 U/L — ABNORMAL HIGH (ref 39–117)
Anion Gap: 7 (ref 7–16)
BILIRUBIN TOTAL: 4.9 mg/dL — AB (ref 0.2–1.0)
BUN: 26 mg/dL — ABNORMAL HIGH (ref 7–18)
BUN: 24 mg/dL — AB (ref 6–23)
CALCIUM: 9.5 mg/dL (ref 8.5–10.1)
CALCIUM: 9.5 mg/dL (ref 8.4–10.5)
CO2: 22 mEq/L (ref 19–32)
CREATININE: 0.86 mg/dL (ref 0.50–1.35)
Chloride: 89 mmol/L — ABNORMAL LOW (ref 98–107)
Chloride: 88 mEq/L — ABNORMAL LOW (ref 96–112)
Co2: 25 mmol/L (ref 21–32)
Creatinine: 1.43 mg/dL — ABNORMAL HIGH (ref 0.60–1.30)
EGFR (Non-African Amer.): >60
GFR calc Af Amer: >90 mL/min (ref >90)
GFR calc non Af Amer: >90 mL/min (ref >90)
GLUCOSE: 123 mg/dL — AB (ref 65–99)
Glucose, Bld: 103 mg/dL — ABNORMAL HIGH (ref 70–99)
OSMOLALITY: 250 (ref 275–301)
POTASSIUM: 4.3 mmol/L (ref 3.5–5.1)
Potassium: 4.5 mEq/L (ref 3.7–5.3)
Sodium: 121 mmol/L — ABNORMAL LOW (ref 136–145)
Sodium: 122 mEq/L — ABNORMAL LOW (ref 137–147)
TOTAL PROTEIN: 9.6 g/dL — AB (ref 6.4–8.2)
TOTAL PROTEIN: 8.3 g/dL (ref 6.0–8.3)
Total Bilirubin: 4.7 mg/dL — ABNORMAL HIGH (ref 0.3–1.2)

## 2013-12-10 LAB — URINALYSIS, COMPLETE
Bilirubin,UR: NEGATIVE
Blood: NEGATIVE
Glucose,UR: NEGATIVE mg/dL (ref 0–75)
Ketone: NEGATIVE
Leukocyte Esterase: NEGATIVE
Nitrite: NEGATIVE
PROTEIN: NEGATIVE
Ph: 7 (ref 4.5–8.0)
RBC, UR: NONE SEEN /HPF (ref 0–5)
Specific Gravity: 1.009 (ref 1.003–1.030)

## 2013-12-10 LAB — CBC WITH DIFFERENTIAL/PLATELET
BASOS ABS: 0 10*3/uL (ref 0.0–0.1)
BASOS PCT: 0.3 %
EOS PCT: 0.8 %
Eosinophil #: 0 10*3/uL (ref 0.0–0.7)
HCT: 37 % — AB (ref 40.0–52.0)
HGB: 13.5 g/dL (ref 13.0–18.0)
LYMPHS PCT: 12 %
Lymphocyte #: 0.8 10*3/uL — ABNORMAL LOW (ref 1.0–3.6)
MCH: 38.4 pg — ABNORMAL HIGH (ref 26.0–34.0)
MCHC: 36.5 g/dL — AB (ref 32.0–36.0)
MCV: 105 fL — AB (ref 80–100)
MONO ABS: 0.6 x10 3/mm (ref 0.2–1.0)
Monocyte %: 9.9 %
Neutrophil #: 5 10*3/uL (ref 1.4–6.5)
Neutrophil %: 77 %
Platelet: 171 10*3/uL (ref 150–440)
RBC: 3.51 10*6/uL — AB (ref 4.40–5.90)
RDW: 16.5 % — ABNORMAL HIGH (ref 11.5–14.5)
WBC: 6.5 10*3/uL (ref 3.8–10.6)

## 2013-12-10 LAB — TYPE AND SCREEN
ABO/RH(D): A POS
Antibody Screen: NEGATIVE

## 2013-12-10 LAB — PROTIME-INR
INR: 1.49 (ref 0.00–1.49)
Prothrombin Time: 18 seconds — ABNORMAL HIGH (ref 11.6–15.2)

## 2013-12-10 LAB — TSH: TSH: 2.52 u[IU]/mL (ref 0.350–4.500)

## 2013-12-10 MED ORDER — ACETAMINOPHEN 325 MG PO TABS
650.0000 mg | ORAL_TABLET | Freq: Four times a day (QID) | ORAL | Status: DC | PRN
  Administered 2013-12-12 – 2013-12-18 (×7): 650 mg via ORAL
  Filled 2013-12-10 (×7): qty 2

## 2013-12-10 MED ORDER — GABAPENTIN 300 MG PO CAPS
300.0000 mg | ORAL_CAPSULE | Freq: Three times a day (TID) | ORAL | Status: DC
  Administered 2013-12-10 – 2013-12-19 (×26): 300 mg via ORAL
  Filled 2013-12-10 (×34): qty 1

## 2013-12-10 MED ORDER — SODIUM CHLORIDE 0.9 % IJ SOLN
3.0000 mL | Freq: Two times a day (BID) | INTRAMUSCULAR | Status: DC
  Administered 2013-12-10 – 2013-12-16 (×8): 3 mL via INTRAVENOUS

## 2013-12-10 MED ORDER — AZITHROMYCIN 600 MG PO TABS
1200.0000 mg | ORAL_TABLET | ORAL | Status: DC
  Administered 2013-12-15: 1200 mg via ORAL
  Filled 2013-12-10: qty 2

## 2013-12-10 MED ORDER — SODIUM CHLORIDE 0.9 % IV SOLN
INTRAVENOUS | Status: DC
  Administered 2013-12-10 – 2013-12-12 (×2): via INTRAVENOUS

## 2013-12-10 MED ORDER — RITONAVIR 100 MG PO CAPS
100.0000 mg | ORAL_CAPSULE | Freq: Every day | ORAL | Status: DC
  Administered 2013-12-11 – 2013-12-19 (×9): 100 mg via ORAL
  Filled 2013-12-10 (×10): qty 1

## 2013-12-10 MED ORDER — CYCLOBENZAPRINE HCL 5 MG PO TABS
5.0000 mg | ORAL_TABLET | Freq: Every day | ORAL | Status: DC
  Administered 2013-12-10 – 2013-12-18 (×9): 5 mg via ORAL
  Filled 2013-12-10 (×13): qty 1

## 2013-12-10 MED ORDER — MORPHINE SULFATE ER 30 MG PO TBCR
30.0000 mg | EXTENDED_RELEASE_TABLET | Freq: Two times a day (BID) | ORAL | Status: DC
  Administered 2013-12-10 – 2013-12-11 (×2): 30 mg via ORAL
  Filled 2013-12-10 (×2): qty 2

## 2013-12-10 MED ORDER — ENOXAPARIN SODIUM 40 MG/0.4ML ~~LOC~~ SOLN: 40.0000 mg | SUBCUTANEOUS | Status: DC

## 2013-12-10 MED ORDER — SPIRONOLACTONE 50 MG PO TABS
50.0000 mg | ORAL_TABLET | Freq: Every day | ORAL | Status: DC
  Administered 2013-12-11 – 2013-12-12 (×2): 50 mg via ORAL
  Filled 2013-12-10 (×2): qty 1

## 2013-12-10 MED ORDER — ONDANSETRON HCL 4 MG/2ML IJ SOLN
4.0000 mg | Freq: Four times a day (QID) | INTRAMUSCULAR | Status: DC | PRN
  Administered 2013-12-11 – 2013-12-19 (×6): 4 mg via INTRAVENOUS
  Filled 2013-12-10 (×6): qty 2

## 2013-12-10 MED ORDER — HYDROMORPHONE HCL PF 1 MG/ML IJ SOLN
1.0000 mg | INTRAMUSCULAR | Status: DC | PRN
  Administered 2013-12-10 – 2013-12-14 (×13): 1 mg via INTRAVENOUS
  Filled 2013-12-10 (×14): qty 1

## 2013-12-10 MED ORDER — PANTOPRAZOLE SODIUM 40 MG PO TBEC
40.0000 mg | DELAYED_RELEASE_TABLET | Freq: Two times a day (BID) | ORAL | Status: DC
  Administered 2013-12-10 – 2013-12-19 (×18): 40 mg via ORAL
  Filled 2013-12-10 (×18): qty 1

## 2013-12-10 MED ORDER — DARUNAVIR ETHANOLATE 800 MG PO TABS
800.0000 mg | ORAL_TABLET | Freq: Every day | ORAL | Status: DC
  Administered 2013-12-11 – 2013-12-19 (×9): 800 mg via ORAL
  Filled 2013-12-10 (×10): qty 1

## 2013-12-10 MED ORDER — ONDANSETRON HCL 4 MG PO TABS: 4.0000 mg | ORAL_TABLET | Freq: Four times a day (QID) | ORAL | Status: DC | PRN

## 2013-12-10 MED ORDER — SUCRALFATE 1 G PO TABS
1.0000 g | ORAL_TABLET | Freq: Three times a day (TID) | ORAL | Status: DC
  Administered 2013-12-11 – 2013-12-19 (×22): 1 g via ORAL
  Filled 2013-12-10 (×30): qty 1

## 2013-12-10 MED ORDER — EMTRICITABINE-TENOFOVIR DF 200-300 MG PO TABS
1.0000 | ORAL_TABLET | Freq: Every day | ORAL | Status: DC
  Administered 2013-12-10 – 2013-12-18 (×9): 1 via ORAL
  Filled 2013-12-10 (×10): qty 1

## 2013-12-10 MED ORDER — DOCUSATE SODIUM 100 MG PO CAPS
100.0000 mg | ORAL_CAPSULE | Freq: Two times a day (BID) | ORAL | Status: DC
  Administered 2013-12-10 – 2013-12-19 (×15): 100 mg via ORAL
  Filled 2013-12-10 (×16): qty 1

## 2013-12-10 MED ORDER — ATOVAQUONE 750 MG/5ML PO SUSP
1500.0000 mg | Freq: Every day | ORAL | Status: DC
  Filled 2013-12-10 (×4): qty 10

## 2013-12-10 MED ORDER — ACETAMINOPHEN 650 MG RE SUPP: 650.0000 mg | Freq: Four times a day (QID) | RECTAL | Status: DC | PRN

## 2013-12-10 NOTE — H&P (Signed)
[facility] History and Physical  Samuel Schultz QQI:[id number] DOB: 08-Dec-1975 DOA: 12/10/2013  Referring physician:  PCP: [name], MD   Chief Complaint:   HPI:  38 y.o. male is here because of history of diffuse large B cell lymphoma, stage IV disease and was treated with R. 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 He was continued on Cubicin and fluconazole through 6/24, 14 days after his last negative culture.   During the same admission he also complained of right-sided lower back pain and leg pain, MRI was found to be negative for discitis . He was started on Flexeril and MS Contin for back pain. He has been progressively getting weaker, has had chills,rigors,, no documented fever at the nursing home. The patient is unable to ambulate because of weakness and pain in his back. He had an MRI done at [facility] ED that showed new/progressive L3-L4 discitis/osteomyelitis but no abscess. He also had WBC count of 6.8, a sodium of 121.  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 Subsequently had a PICC line placed, which was removed  after completion of fluconazole and Cubicin .     Review of Systems: negative for the following  Constitutional: Denies fever, chills, diaphoresis, appetite change and fatigue.  HEENT: Denies photophobia, eye pain, redness, hearing loss, ear pain, congestion, sore throat, rhinorrhea, sneezing, mouth sores, trouble swallowing, neck pain, neck stiffness and tinnitus.  Respiratory: Denies SOB, DOE, cough, chest tightness, and wheezing.  Cardiovascular: Denies chest pain, palpitations and leg swelling.  Gastrointestinal: Denies nausea, vomiting, abdominal pain, diarrhea, constipation, blood in stool and abdominal distention.  Genitourinary: Denies dysuria, urgency, frequency, hematuria, flank pain and difficulty urinating.  Musculoskeletal: Positive for myalgias, back pain, joint swelling, arthralgias and gait problem.  Skin: Denies pallor, rash and wound.  Neurological: Denies dizziness, seizures, syncope, weakness, light-headedness, numbness and headaches.  Hematological: Denies adenopathy. Easy bruising, personal or family bleeding history  Psychiatric/Behavioral: Denies suicidal ideation, mood changes, confusion, nervousness, sleep disturbance and agitation       Past Medical History  Diagnosis Date  . Cancer   . Lymphoma   . HIV disease   . Cirrhosis   . PTSD (post-traumatic stress disorder)   . Depressive disorder      Past Surgical History  Procedure Laterality Date  . Cholecystectomy    . Appendectomy    . Tee without cardioversion N/A 11/13/2013    Procedure: TRANSESOPHAGEAL ECHOCARDIOGRAM (TEE);  Surgeon: Dorothy Spark, MD;  Location: [facility];  Service: Cardiovascular;  Laterality: N/A;      Social History:  reports that he has never smoked. He has never used smokeless tobacco. He reports that he does not drink alcohol or use illicit drugs.    Allergies  Allergen Reactions  . Codeine  Anaphylaxis    Family History  Problem Relation Age of Onset  .  Cancer Maternal Grandmother     breast ca  . Cancer Maternal Grandfather     colon ca     Prior to Admission medications   Medication Sig Start Date End Date Taking? Authorizing Provider  atovaquone (MEPRON) 750 MG/5ML suspension Take 1,500 mg by mouth daily.    Historical Provider, MD  azithromycin (ZITHROMAX) 600 MG tablet Take 2 tablets (1,200 mg total) by mouth once a week. Next dose on 11/24/13 11/24/13   Orson Eva, MD  cyclobenzaprine (FLEXERIL) 5 MG tablet Take 1 tablet (5 mg total) by mouth at bedtime. 11/20/13   Orson Eva, MD  Darunavir Ethanolate (PREZISTA) 800 MG tablet Take 800 mg by mouth daily.    Historical Provider, MD  diphenoxylate-atropine (LOMOTIL) 2.5-0.025 MG per tablet Take 2 tablets by mouth 3 (three) times daily. 11/20/13   Orson Eva, MD  emtricitabine-tenofovir (TRUVADA) 200-300 MG per tablet Take 1 tablet by mouth at bedtime.    Historical Provider, MD  escitalopram (LEXAPRO) 20 MG tablet Take 20 mg by mouth daily.    Historical Provider, MD  feeding supplement, RESOURCE BREEZE, (RESOURCE BREEZE) LIQD Take 1 Container by mouth 3 (three) times daily between meals. 11/24/13   Debbe Odea, MD  furosemide (LASIX) 20 MG tablet Take 20 mg by mouth daily. 11/02/13 11/02/14  Historical Provider, MD  gabapentin (NEURONTIN) 300 MG capsule Take 300 mg by mouth 3 (three) times daily.    Historical Provider, MD  morphine (MS CONTIN) 30 MG 12 hr tablet Take 1 tablet (30 mg total) by mouth 2 (two) times daily. 11/20/13   Orson Eva, MD  pantoprazole (PROTONIX) 40 MG tablet Take 40 mg by mouth 2 (two) times daily.    Historical Provider, MD  ritonavir (NORVIR) 100 MG capsule Take 100 mg by mouth daily with breakfast.    Historical Provider, MD  spironolactone (ALDACTONE) 50 MG tablet Take 50 mg by mouth daily. 11/02/13 11/02/14  Historical Provider, MD  sucralfate (CARAFATE) 1 G tablet Take 1 g by mouth 3 (three) times daily with meals.     Historical Provider, MD     Physical  Exam: Filed Vitals:   12/10/13 1904  BP: 113/64  Pulse: 86  Temp: 97.9 F (36.6 C)  TempSrc: Oral  Resp: 20  Height: 5\' 9"  (1.753 m)  Weight: 88.451 kg (195 lb)  SpO2: 95%     Constitutional: Vital signs reviewed. Patient is a well-developed and well-nourished in no acute distress and cooperative with exam. Alert and oriented x3.  Head: Normocephalic and atraumatic  Ear: TM normal bilaterally  Mouth: no erythema or exudates, MMM  Eyes: PERRL, EOMI, conjunctivae normal, No scleral icterus.  Neck: Supple, Trachea midline normal ROM, No JVD, mass, thyromegaly, or carotid bruit present.  Cardiovascular: RRR, S1 normal, S2 normal, no MRG, pulses symmetric and intact bilaterally  Pulmonary/Chest: CTAB, no wheezes, rales, or rhonchi  Abdominal: Soft. Non-tender, non-distended, bowel sounds are normal, no masses, organomegaly, or guarding present.  GU: no CVA tenderness Musculoskeletal: No joint deformities, erythema, or stiffness, ROM full and no nontender Ext: no edema and no cyanosis, pulses palpable bilaterally (DP and PT)  Hematology: no cervical, inginal, or axillary adenopathy.  Neurological: A&O x3, Strenght is normal and symmetric bilaterally, cranial nerve II-XII are grossly intact, no focal motor deficit, sensory intact to light touch bilaterally.  Skin: Warm, dry and intact. No rash, cyanosis, or clubbing.  Psychiatric:  Normal mood and affect. speech and behavior is normal. Judgment and thought content normal. Cognition and memory are normal.       Labs on Admission:    Basic Metabolic Panel: No results found for this basename: NA, K, CL, CO2, GLUCOSE, BUN, CREATININE, CALCIUM, MG, PHOS,  in the last 168 hours Liver Function Tests: No results found for this basename: AST, ALT, ALKPHOS, BILITOT, PROT, ALBUMIN,  in the last 168 hours No results found for this basename: LIPASE, AMYLASE,  in the last 168 hours No results found for this basename: AMMONIA,  in the last 168  hours CBC: No results found for this basename: WBC, NEUTROABS, HGB, HCT, MCV, PLT,  in the last 168 hours Cardiac Enzymes: No results found for this basename: CKTOTAL, CKMB, CKMBINDEX, TROPONINI,  in the last 168 hours  BNP (last 3 results) No results found for this basename: PROBNP,  in the last 8760 hours    CBG: No results found for this basename: GLUCAP,  in the last 168 hours  Radiological Exams on Admission: No results found.  EKG: Independently reviewed.   Assessment/Plan Active Problems:   Discitis   Osteomyelitis/discitis/L3-L4 Recent enterococcal bacteremia and fungemia Completed antibiotics 6/24 cultures obtained 11/17/2013 are negative to date Discussed with Dr.,comer,, infectious disease, He recommends interventional radiology aspiration/ biopsy Patient received empiric vancomycin and Zosyn Hold off on antibiotics for now and infectious disease will see him in the morning Obtain AFB, fungal, bacterial culture May need FFP infusion prior to biopsy, will check stat PT/INR  Back pain Secondary to the above diagnoses Continue MS Contin, Flexeril PT OT consultation  HIV/AIDS on HAART  - appreciate ID consult and recommendations  - pt is on ART . CD4 count of 20  - also on azithromycin and bactrim for PCP and MAC prophylaxis .monitor Qtc. -Continue mepron--pt has been intermittenly refusing--counseled on importance of taking it  - he has an ID physician who manages his HIV  Pancytopenia  - likely bone marrow suppression from HIV/AIDS and liver cirrhosis . No lymphoma seen on recent bone marrow bx done at [facility].   Cirrhosis  -Has thrombocytopenia and coagulopathy.   Depression  Holding Lexapro because of low sodium of 121.   Liver cirrhosis  Resumed Aldactone and Lasix.  -Doses of furosemide and Aldactone have been increased due to the patient's anasarca   Hyponatremia Holding Lexapro, could be secondary to dehydration Repeat CMP, if low will  check urine osmolality serum osmolality Could be secondary to his HIV  Code Status:   full Family Communication: bedside Disposition Plan: admit   Time spent: 70 mins   [facility] Pager [phone]  If 7PM-7AM, please contact night-coverage [email or web address] Password [email or web address] 12/10/2013, 8:52 PM

## 2013-12-10 NOTE — Progress Notes (Signed)
Paged MD, pt pain 9/10.  MD stated "putting some orders in now" that she will be to pt's room shortly.

## 2013-12-11 ENCOUNTER — Inpatient Hospital Stay (HOSPITAL_COMMUNITY): Payer: Medicaid Other

## 2013-12-11 ENCOUNTER — Inpatient Hospital Stay: Payer: Medicaid Other | Admitting: Internal Medicine

## 2013-12-11 ENCOUNTER — Encounter (HOSPITAL_COMMUNITY): Payer: Self-pay | Admitting: *Deleted

## 2013-12-11 DIAGNOSIS — M519 Unspecified thoracic, thoracolumbar and lumbosacral intervertebral disc disorder: Principal | ICD-10-CM

## 2013-12-11 DIAGNOSIS — Z21 Asymptomatic human immunodeficiency virus [HIV] infection status: Secondary | ICD-10-CM

## 2013-12-11 DIAGNOSIS — M869 Osteomyelitis, unspecified: Secondary | ICD-10-CM

## 2013-12-11 LAB — CREATININE, URINE, RANDOM: Creatinine, Urine: 91.56 mg/dL

## 2013-12-11 LAB — URINE MICROSCOPIC-ADD ON

## 2013-12-11 LAB — COMPREHENSIVE METABOLIC PANEL
ALBUMIN: 2.7 g/dL — AB (ref 3.5–5.2)
ALK PHOS: 117 U/L (ref 39–117)
ALT: 69 U/L — AB (ref 0–53)
AST: 79 U/L — AB (ref 0–37)
Anion gap: 9 (ref 5–15)
BILIRUBIN TOTAL: 4.3 mg/dL — AB (ref 0.3–1.2)
BUN: 21 mg/dL (ref 6–23)
CHLORIDE: 92 meq/L — AB (ref 96–112)
CO2: 23 meq/L (ref 19–32)
Calcium: 9.1 mg/dL (ref 8.4–10.5)
Creatinine, Ser: 0.9 mg/dL (ref 0.50–1.35)
GFR calc Af Amer: >90 mL/min (ref >90)
Glucose, Bld: 86 mg/dL (ref 70–99)
POTASSIUM: 4.5 meq/L (ref 3.7–5.3)
SODIUM: 124 meq/L — AB (ref 137–147)
Total Protein: 8 g/dL (ref 6.0–8.3)

## 2013-12-11 LAB — OSMOLALITY: Osmolality: 270 mOsm/kg — ABNORMAL LOW (ref 275–300)

## 2013-12-11 LAB — URINALYSIS, ROUTINE W REFLEX MICROSCOPIC
GLUCOSE, UA: NEGATIVE mg/dL
HGB URINE DIPSTICK: NEGATIVE
KETONES UR: NEGATIVE mg/dL
Nitrite: NEGATIVE
PROTEIN: NEGATIVE mg/dL
Specific Gravity, Urine: 1.021 (ref 1.005–1.030)
UROBILINOGEN UA: 4 mg/dL — AB (ref 0.0–1.0)
pH: 6.5 (ref 5.0–8.0)

## 2013-12-11 LAB — CBC
HCT: 31.6 % — ABNORMAL LOW (ref 39.0–52.0)
Hemoglobin: 11.3 g/dL — ABNORMAL LOW (ref 13.0–17.0)
MCH: 36.5 pg — AB (ref 26.0–34.0)
MCHC: 35.8 g/dL (ref 30.0–36.0)
MCV: 101.9 fL — AB (ref 78.0–100.0)
PLATELETS: 119 10*3/uL — AB (ref 150–400)
RBC: 3.1 MIL/uL — ABNORMAL LOW (ref 4.22–5.81)
RDW: 15.7 % — AB (ref 11.5–15.5)
WBC: 4.3 10*3/uL (ref 4.0–10.5)

## 2013-12-11 LAB — SODIUM, URINE, RANDOM: SODIUM UR: 59 meq/L

## 2013-12-11 LAB — PROTIME-INR
INR: 1.53 — AB (ref 0.00–1.49)
PROTHROMBIN TIME: 18.4 s — AB (ref 11.6–15.2)

## 2013-12-11 MED ORDER — HYDROMORPHONE HCL PF 1 MG/ML IJ SOLN
INTRAMUSCULAR | Status: AC
  Filled 2013-12-11: qty 1

## 2013-12-11 MED ORDER — MIDAZOLAM HCL 2 MG/2ML IJ SOLN
INTRAMUSCULAR | Status: AC | PRN
  Administered 2013-12-11 (×2): 1 mg via INTRAVENOUS

## 2013-12-11 MED ORDER — SODIUM CHLORIDE 0.9 % IV SOLN
INTRAVENOUS | Status: AC
  Administered 2013-12-11: 14:00:00 via INTRAVENOUS

## 2013-12-11 MED ORDER — MIDAZOLAM HCL 2 MG/2ML IJ SOLN
INTRAMUSCULAR | Status: AC
  Filled 2013-12-11: qty 4

## 2013-12-11 MED ORDER — FENTANYL CITRATE 0.05 MG/ML IJ SOLN
INTRAMUSCULAR | Status: AC | PRN
  Administered 2013-12-11 (×4): 25 ug via INTRAVENOUS

## 2013-12-11 MED ORDER — MORPHINE SULFATE ER 15 MG PO TBCR
15.0000 mg | EXTENDED_RELEASE_TABLET | Freq: Two times a day (BID) | ORAL | Status: DC
  Administered 2013-12-11 – 2013-12-19 (×16): 15 mg via ORAL
  Filled 2013-12-11 (×16): qty 1

## 2013-12-11 MED ORDER — FENTANYL CITRATE 0.05 MG/ML IJ SOLN
INTRAMUSCULAR | Status: AC
  Filled 2013-12-11: qty 4

## 2013-12-11 MED ORDER — PHYTONADIONE 5 MG PO TABS
10.0000 mg | ORAL_TABLET | Freq: Every day | ORAL | Status: DC
  Administered 2013-12-11 – 2013-12-19 (×9): 10 mg via ORAL
  Filled 2013-12-11 (×9): qty 2

## 2013-12-11 NOTE — Procedures (Signed)
S/P  L3 L4 disc aspiration with fluoro guidance.

## 2013-12-11 NOTE — H&P (Signed)
Samuel Schultz is an 38 y.o. male.   Chief Complaint: Pt has had progressive back pain since June 2015 He has Hx lymphoma and PAC was removed after treatment 6/66/15 Back pain has worsened recently and now admitted MRI reveals Lumbar 3-4 discitis Request has been made for consult for disc aspiration per [facility] Dr Allyson Sabal Dr Estanislado Pandy has reviewed imaging Pt has been seen and examined Now scheduled for same  HPI: Lymphoma; HIV; Cirrhosis; PTSD; back pain  Past Medical History  Diagnosis Date  . Cancer   . Lymphoma   . HIV disease   . Cirrhosis   . PTSD (post-traumatic stress disorder)   . Depressive disorder     Past Surgical History  Procedure Laterality Date  . Cholecystectomy    . Appendectomy    . Tee without cardioversion N/A 11/13/2013    Procedure: TRANSESOPHAGEAL ECHOCARDIOGRAM (TEE);  Surgeon: Dorothy Spark, MD;  Location: [facility] ENDOSCOPY;  Service: Cardiovascular;  Laterality: N/A;    Family History  Problem Relation Age of Onset  . Cancer Maternal Grandmother     breast ca  . Cancer Maternal Grandfather     colon ca   Social History:  reports that he has never smoked. He has never used smokeless tobacco. He reports that he does not drink alcohol or use illicit drugs.  Allergies:  Allergies  Allergen Reactions  . Codeine Anaphylaxis    Medications Prior to Admission  Medication Sig Dispense Refill  . atovaquone (MEPRON) 750 MG/5ML suspension Take 1,500 mg by mouth daily.      Marland Kitchen azithromycin (ZITHROMAX) 600 MG tablet Take 2 tablets (1,200 mg total) by mouth once a week. Next dose on 11/24/13  8 tablet  0  . cyclobenzaprine (FLEXERIL) 5 MG tablet Take 1 tablet (5 mg total) by mouth at bedtime.  30 tablet  0  . Darunavir Ethanolate (PREZISTA) 800 MG tablet Take 800 mg by mouth daily.      . diphenoxylate-atropine (LOMOTIL) 2.5-0.025 MG per tablet Take 2 tablets by mouth 3 (three) times daily.  180 tablet  0  . emtricitabine-tenofovir (TRUVADA) 200-300 MG per tablet  Take 1 tablet by mouth at bedtime.      Marland Kitchen escitalopram (LEXAPRO) 20 MG tablet Take 20 mg by mouth daily.      . feeding supplement, RESOURCE BREEZE, (RESOURCE BREEZE) LIQD Take 1 Container by mouth 3 (three) times daily between meals.    0  . furosemide (LASIX) 20 MG tablet Take 20 mg by mouth daily.      Marland Kitchen gabapentin (NEURONTIN) 300 MG capsule Take 300 mg by mouth 3 (three) times daily.      Marland Kitchen morphine (MS CONTIN) 30 MG 12 hr tablet Take 1 tablet (30 mg total) by mouth 2 (two) times daily.  60 tablet  0  . pantoprazole (PROTONIX) 40 MG tablet Take 40 mg by mouth 2 (two) times daily.      . ritonavir (NORVIR) 100 MG capsule Take 100 mg by mouth daily with breakfast.      . spironolactone (ALDACTONE) 50 MG tablet Take 50 mg by mouth daily.      . sucralfate (CARAFATE) 1 G tablet Take 1 g by mouth 3 (three) times daily with meals.         Results for orders placed during the hospital encounter of 12/10/13 (from the past 48 hour(s))  COMPREHENSIVE METABOLIC PANEL     Status: Abnormal   Collection Time    12/10/13  8:50 PM  Result Value Ref Range   Sodium 122 (*) 137 - 147 mEq/L   Potassium 4.5  3.7 - 5.3 mEq/L   Chloride 88 (*) 96 - 112 mEq/L   CO2 22  19 - 32 mEq/L   Glucose, Bld 103 (*) 70 - 99 mg/dL   BUN 24 (*) 6 - 23 mg/dL   Creatinine, Ser 0.86  0.50 - 1.35 mg/dL   Calcium 9.5  8.4 - 10.5 mg/dL   Total Protein 8.3  6.0 - 8.3 g/dL   Albumin 2.8 (*) 3.5 - 5.2 g/dL   AST 82 (*) 0 - 37 U/L   ALT 67 (*) 0 - 53 U/L   Alkaline Phosphatase 123 (*) 39 - 117 U/L   Total Bilirubin 4.7 (*) 0.3 - 1.2 mg/dL   GFR calc non Af Amer >90  >90 mL/min   GFR calc Af Amer >90  >90 mL/min   Comment: (NOTE)     The eGFR has been calculated using the CKD EPI equation.     This calculation has not been validated in all clinical situations.     eGFR's persistently <90 mL/min signify possible Chronic Kidney     Disease.   Anion gap 12  5 - 15  TSH     Status: None   Collection Time    12/10/13   8:50 PM      Result Value Ref Range   TSH 2.520  0.350 - 4.500 uIU/mL  PROTIME-INR     Status: Abnormal   Collection Time    12/10/13  8:50 PM      Result Value Ref Range   Prothrombin Time 18.0 (*) 11.6 - 15.2 seconds   INR 1.49  0.00 - 1.49  TYPE AND SCREEN     Status: None   Collection Time    12/10/13 10:30 PM      Result Value Ref Range   ABO/RH(D) A POS     Antibody Screen NEG     Sample Expiration 12/13/2013    COMPREHENSIVE METABOLIC PANEL     Status: Abnormal   Collection Time    12/11/13  5:00 AM      Result Value Ref Range   Sodium 124 (*) 137 - 147 mEq/L   Potassium 4.5  3.7 - 5.3 mEq/L   Chloride 92 (*) 96 - 112 mEq/L   CO2 23  19 - 32 mEq/L   Glucose, Bld 86  70 - 99 mg/dL   BUN 21  6 - 23 mg/dL   Creatinine, Ser 0.90  0.50 - 1.35 mg/dL   Calcium 9.1  8.4 - 10.5 mg/dL   Total Protein 8.0  6.0 - 8.3 g/dL   Albumin 2.7 (*) 3.5 - 5.2 g/dL   AST 79 (*) 0 - 37 U/L   ALT 69 (*) 0 - 53 U/L   Alkaline Phosphatase 117  39 - 117 U/L   Total Bilirubin 4.3 (*) 0.3 - 1.2 mg/dL   GFR calc non Af Amer >90  >90 mL/min   GFR calc Af Amer >90  >90 mL/min   Comment: (NOTE)     The eGFR has been calculated using the CKD EPI equation.     This calculation has not been validated in all clinical situations.     eGFR's persistently <90 mL/min signify possible Chronic Kidney     Disease.   Anion gap 9  5 - 15  CBC     Status: Abnormal   Collection Time  12/11/13  5:00 AM      Result Value Ref Range   WBC 4.3  4.0 - 10.5 K/uL   RBC 3.10 (*) 4.22 - 5.81 MIL/uL   Hemoglobin 11.3 (*) 13.0 - 17.0 g/dL   HCT 31.6 (*) 39.0 - 52.0 %   MCV 101.9 (*) 78.0 - 100.0 fL   MCH 36.5 (*) 26.0 - 34.0 pg   MCHC 35.8  30.0 - 36.0 g/dL   RDW 15.7 (*) 11.5 - 15.5 %   Platelets 119 (*) 150 - 400 K/uL   Comment: CONSISTENT WITH PREVIOUS RESULT   No results found.  Review of Systems  Constitutional: Negative for fever.  Respiratory: Negative for shortness of breath.   Cardiovascular:  Negative for chest pain.  Gastrointestinal: Negative for nausea and vomiting.  Musculoskeletal: Positive for back pain.  Neurological: Positive for weakness and headaches. Negative for dizziness.    Blood pressure 117/59, pulse 89, temperature 97.5 F (36.4 C), temperature source Oral, resp. rate 19, height _0  (1.753 m), weight 88.451 kg (195 lb), SpO2 95.00%. Physical Exam  Constitutional: He is oriented to person, place, and time. He appears well-nourished.  Cardiovascular: Normal rate and regular rhythm.   No murmur heard. Respiratory: Effort normal. He has no wheezes.  GI: Soft. Bowel sounds are normal. There is no tenderness.  Musculoskeletal: Normal range of motion.  Low back pain  Neurological: He is alert and oriented to person, place, and time.  Skin: Skin is warm.  Psychiatric: He has a normal mood and affect. His behavior is normal. Thought content normal.     Assessment/Plan LBP worsening x few weeks MRI shows discitis L3-4 Now scheduled for aspiration Pt is aware of procedure benefits and risks and agreeable to proceed Consent signed and in chart  Vonore A 12/11/2013, 9:49 AM

## 2013-12-11 NOTE — Progress Notes (Signed)
Patient Demographics  Samuel Schultz, is a 38 y.o. male, DOB - [birth year], PYK:[id number]  Admit date - 12/10/2013   Admitting Physician Domenic Polite, MD  Outpatient Primary MD for the patient is [name], MD  LOS - 1   No chief complaint on file.       HPI:  38 y.o. male is here because of history of diffuse large B cell lymphoma, stage IV disease and was treated with R. 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 He was continued on Cubicin and fluconazole through 6/24, 14 days after his last negative culture.   During the same admission he also complained of right-sided lower back pain and leg pain, MRI was found to be negative for discitis . He was started on Flexeril and MS Contin for back pain. He has been progressively getting weaker, has had chills,rigors,, no documented fever at the nursing home. The patient is unable to ambulate because of weakness and pain in his back. He had an MRI done at [facility] ED that showed new/progressive L3-L4 discitis/osteomyelitis but no abscess. He also had WBC  count of 6.8, a sodium of 121.   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        Subjective:   Samuel Schultz today has, No headache, No chest pain, No abdominal pain - No Nausea, No new weakness tingling or numbness, No Cough - SOB. ++ Low back pain.    Assessment & Plan    1. Back Pain - per MRI done at [facility] ER on 12/10/2013 patient has discitis/os to mellitus without the evidence of frank abscess at L3-L4. Case was discussed with ID who are on board, currently planned to hold antibiotics, patient to be taken to radiology by IR for CT-guided needle aspiration on 12/11/2013 for targeted culture and sensitivity. ID following. Continue supportive care for now.    2. AIDS - is to be followed by ID in the outpatient setting had a pending appointment. Continue outpatient medications for now. ID to follow.    3. History of advanced Large cell lymphoma with pancytopenia (Cirrhosis) - being followed by Dr. Alvy Bimler and [facility] in the outpatient setting.     4. 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    5. Depression on Lexapro.    6. Hyponatremia. On Aldactone for cirrhosis, will check urine sodium  and osmolality along with serum osmolality. Currently seems to have responded a little bit to IV fluids. We'll monitor.      Code Status: full  Family Communication: none present  Disposition Plan: TBD   Procedures   MRI [place] [facility] spine - diskitis   Consults  IR, ID   Medications  Scheduled Meds: . atovaquone  1,500 mg Oral Daily  . [START ON 12/15/2013] azithromycin  1,200 mg  Oral Q Fri  . cyclobenzaprine  5 mg Oral QHS  . Darunavir Ethanolate  800 mg Oral Q breakfast  . docusate sodium  100 mg Oral BID  . emtricitabine-tenofovir  1 tablet Oral QHS  . gabapentin  300 mg Oral TID  . morphine  30 mg Oral BID  . pantoprazole  40 mg Oral BID  . phytonadione  10 mg Oral Daily  . ritonavir  100 mg Oral Q breakfast  . sodium chloride  3 mL Intravenous Q12H  . spironolactone  50 mg Oral Daily  . sucralfate  1 g Oral TID WC   Continuous Infusions: . sodium chloride 75 mL/hr at 12/10/13 2030   PRN Meds:.acetaminophen, acetaminophen, HYDROmorphone (DILAUDID) injection, ondansetron (ZOFRAN) IV, ondansetron  DVT Prophylaxis  SCDs    Lab Results  Component Value Date   PLT 119* 12/11/2013    Antibiotics    Anti-infectives   Start     Dose/Rate Route Frequency Ordered Stop   12/15/13 1000  azithromycin (ZITHROMAX) tablet 1,200 mg     1,200 mg Oral Every Fri 12/10/13 2050     12/11/13 1000  atovaquone (MEPRON) 750 MG/5ML suspension 1,500 mg     1,500 mg Oral Daily 12/10/13 2050     12/11/13 0800  Darunavir Ethanolate (PREZISTA) tablet 800 mg     800 mg Oral Daily with breakfast 12/10/13 2050     12/11/13 0800  ritonavir (NORVIR) capsule 100 mg     100 mg Oral Daily with breakfast 12/10/13 2050     12/10/13 2200  emtricitabine-tenofovir (TRUVADA) 200-300 MG per tablet 1 tablet     1 tablet Oral Daily at bedtime 12/10/13 2050            Objective:   Filed Vitals:   12/10/13 1904 12/10/13 2245 12/11/13 0545  BP: 113/64 117/60 117/59  Pulse: 86 85 89  Temp: 97.9 F (36.6 C) 97.9 F (36.6 C) 97.5 F (36.4 C)  TempSrc: Oral Oral Oral  Resp: 20 18 19   Height: 5\' 9"  (1.753 m)    Weight: 88.451 kg (195 lb)    SpO2: 95% 94% 95%    Wt Readings from Last 3 Encounters:  12/10/13 88.451 kg (195 lb)  12/05/13 87.68 kg (193 lb 4.8 oz)  11/23/13 99.111 kg (218 lb 8 oz)     Intake/Output Summary (Last 24 hours) at 12/11/13 1038 Last data filed at  12/11/13 0930  Gross per 24 hour  Intake    240 ml  Output   1700 ml  Net  -1460 ml     Physical Exam  Awake Alert, Oriented X 3, No new F.N deficits, Normal affect [place].AT,PERRAL Supple Neck,No JVD, No cervical lymphadenopathy appriciated.  Symmetrical Chest wall movement, Good air movement bilaterally, CTAB RRR,No Gallops,Rubs or new Murmurs, No Parasternal Heave +ve B.Sounds, Abd Soft, No tenderness, No organomegaly appriciated, No rebound - guarding or rigidity. No Cyanosis, Clubbing or edema, No new Rash or bruise      Data Review   Micro Results No results found  for this or any previous visit (from the past 240 hour(s)).  Radiology Reports Dg Abd 1 View  11/12/2013   CLINICAL DATA:  Colitis.  EXAM: ABDOMEN - 1 VIEW  COMPARISON:  CT of the abdomen and pelvis 11/10/2013.  FINDINGS: Oral contrast material is noted throughout the colon and distal rectum. No pathologic distention of small bowel. No pneumoperitoneum. Multiple surgical clips project over the right upper quadrant of the abdomen. Splenic contour appears enlarged.  IMPRESSION: 1. Nonobstructive bowel gas pattern. 2. No pneumoperitoneum. 3. Splenomegaly.   Electronically Signed   By: Vinnie Langton M.D.   On: 11/12/2013 11:47   Mr Lumbar Spine W Wo Contrast  11/15/2013   CLINICAL DATA:  38 year old male with bacteremia and new back pain. Severe low back pain radiating to the right lower extremity. Initial encounter. HIV, cirrhosis, lymphoma.  EXAM: MRI LUMBAR SPINE WITHOUT AND WITH CONTRAST  TECHNIQUE: Multiplanar and multiecho pulse sequences of the lumbar spine were obtained without and with intravenous contrast.  CONTRAST:  65mL MULTIHANCE GADOBENATE DIMEGLUMINE 529 MG/ML IV SOLN  COMPARISON:  CT Abdomen and Pelvis 11/10/2013 and earlier.  FINDINGS: Normal lumbar segmentation depicted on comparison.  Diffuse loss of normal bone marrow signal in the visible spine, and left iliac bone. However, no marrow edema or destructive  osseous lesion identified. No abnormal vertebral enhancement in the spine.  Visualized lower thoracic spinal cord is normal with conus medularis at L1. Normal cauda equina nerve roots. No abnormal intradural enhancement.  There is mild abnormal STIR and T2 signal in the lower right erector spinae muscles, at the L4 and L5 level (series 5, image 1 and series 6, image 26). This is nonspecific. No definite abnormal enhancement, and other visible paraspinal muscles appear normal. There is mild subcutaneous edema in the lumbar spine, significance doubtful in this setting.  Much of the abdominal viscera are obscured on these images.  T11-T12: Grossly normal.  T12-L1:  Negative.  L1-L2:  Negative.  L2-L3:  Negative.  L3-L4: Mild disc space loss and disc desiccation. Mild circumferential disc osteophyte complex. Mild facet hypertrophy. No significant stenosis.  L4-L5:  Negative except for mild facet hypertrophy.  L5-S1: Disc space loss with disc desiccation and right eccentric circumferential disc osteophyte complex. Focal central component (series 6, image 31), but no spinal or definite lateral recess stenosis. No foraminal stenosis.  IMPRESSION: 1. Diffuse loss of normal bone marrow signal, but may be post treatment affect rather than due to widespread osseous metastatic disease. No destructive osseous lesion identified. 2. No evidence of lumbar discitis osteomyelitis. Mild to moderate chronic appearing disc degeneration at L3-L4 and L5-S1. No spinal stenosis. 3. Mild signal abnormality in the right lower lumbar erector spinae musculature, nonspecific. Early infectious myositis is not excluded in this setting.   Electronically Signed   By: Lars Pinks M.D.   On: 11/15/2013 20:30   Ir Removal [employer] Access W/ Port W/o Fl Mod Sed  11/16/2013   CLINICAL DATA:  History of bacteremia, concern for seeding of patient's chronic Port a Catheter.  EXAM: REMOVAL OF IMPLANTED TUNNELED PORT-A-CATH  MEDICATIONS: The patient is currently  admitted to the hospital and receiving intravenous antibiotics; The antibiotic was administered within 1 hour prior to the start of the procedure.  ANESTHESIA/SEDATION: Intravenous Versed, Fentanyl and Dilaudid was administered for conscious sedation  Sedation time: 20  FLUOROSCOPY TIME:  None  PROCEDURE: Informed written consent was obtained from the patient after a discussion of the risk, benefits and alternatives to  the procedure. The patient was positioned supine on the fluoroscopy table and the right chest Port-A-Cath site was prepped with chlorhexidine. A sterile gown and gloves were worn during the procedure. Local anesthesia was provided with 1% lidocaine with epinephrine. A timeout was performed prior to the initiation of the procedure.  An incision was made overlying the Port-A-Cath with a #15 scalpel. Utilizing sharp and blunt dissection, the Port-A-Cath was removed completely. The pocked was irrigated with sterile saline. Wound closure was performed with subcutaneous 3-0 Monocryl, subcuticular 4-0 Vicryl and Dermabond. A dressing was placed. The patient tolerated the procedure well without immediate post procedural complication.  FINDINGS: Successful removal of implant Port-A-Cath without immediate post procedural complication.  Visual inspection of the Port a catheter pocket was negative for any definitive evidence of infection and as such, the incision was closed primarily with absorbable suture as above.  IMPRESSION: Successful removal of implanted Port-A-Cath.   Electronically Signed   By: Sandi Mariscal M.D.   On: 11/16/2013 16:57    CBC  Recent Labs Lab 12/11/13 0500  WBC 4.3  HGB 11.3*  HCT 31.6*  PLT 119*  MCV 101.9*  MCH 36.5*  MCHC 35.8  RDW 15.7*    Chemistries   Recent Labs Lab 12/10/13 2050 12/11/13 0500  NA 122* 124*  K 4.5 4.5  CL 88* 92*  CO2 22 23  GLUCOSE 103* 86  BUN 24* 21  CREATININE 0.86 0.90  CALCIUM 9.5 9.1  AST 82* 79*  ALT 67* 69*  ALKPHOS 123* 117   BILITOT 4.7* 4.3*   ------------------------------------------------------------------------------------------------------------------ estimated creatinine clearance is 122.5 ml/min (by C-G formula based on Cr of 0.9). ------------------------------------------------------------------------------------------------------------------ No results found for this basename: HGBA1C,  in the last 72 hours ------------------------------------------------------------------------------------------------------------------ No results found for this basename: CHOL, HDL, LDLCALC, TRIG, CHOLHDL, LDLDIRECT,  in the last 72 hours ------------------------------------------------------------------------------------------------------------------  Recent Labs  12/10/13 2050  TSH 2.520   ------------------------------------------------------------------------------------------------------------------ No results found for this basename: VITAMINB12, FOLATE, FERRITIN, TIBC, IRON, RETICCTPCT,  in the last 72 hours  Coagulation profile  Recent Labs Lab 12/10/13 2050  INR 1.49    No results found for this basename: DDIMER,  in the last 72 hours  Cardiac Enzymes No results found for this basename: CK, CKMB, TROPONINI, MYOGLOBIN,  in the last 168 hours ------------------------------------------------------------------------------------------------------------------ No components found with this basename: POCBNP,      Time Spent in minutes   35   SINGH,PRASHANT K M.D on 12/11/2013 at 10:38 AM  Between 7am to 7pm - Pager - [phone]  After 7pm go to [email or web address] - password [email or web address]  And look for the night coverage person covering for me after hours  [facility] Office  [phone]   **Disclaimer: This note may have been dictated with voice recognition software. Similar sounding words can inadvertently be transcribed and this note may contain transcription errors which may not have been  corrected upon publication of note.**

## 2013-12-11 NOTE — Sedation Documentation (Signed)
Pain appears to be easing. Sleeping unless disturbed.  Awaiting transport team.

## 2013-12-11 NOTE — Progress Notes (Signed)
OT Cancellation Note  Patient Details Name: Samuel Schultz MRN: [id number] DOB: 1975-10-12   Cancelled Treatment:    Reason Eval/Treat Not Completed: Other (comment) Pt Medicaid and from SNF with current D/C plan back to SNF. No apparent immediate acute care OT needs, therefore will defer OT to SNF. If OT eval is needed please call Acute Rehab Dept. at [phone] or text page OT at [phone].     Almon Register [phone] 12/11/2013, 3:10 PM

## 2013-12-11 NOTE — Evaluation (Signed)
Physical Therapy Evaluation Patient Details Name: Samuel Schultz MRN: [id number] DOB: 1976-04-05 Today's Date: 12/11/2013   History of Present Illness  Pt admitted 7/5 with progressive back pain from [place]. Pt with history of diffuse large B cell lymphoma, stage IV disease.  Clinical Impression  Pt mobility greatly limited by back pain. Pt con't to have bilat LE edema and 9/10 back pain requiring assist for all transfers. Con't to recommend SNF upon d/c. Pt admitted from [place]. Acute PT to follow to progress mobility as able.    Follow Up Recommendations SNF    Equipment Recommendations  None recommended by PT    Recommendations for Other Services       Precautions / Restrictions Precautions Precautions: Fall Restrictions Weight Bearing Restrictions: No      Mobility  Bed Mobility Overal bed mobility: Needs Assistance Bed Mobility: Rolling;Sidelying to Sit Rolling: Modified independent (Device/Increase time) (used bed rail, v/c's for technique) Sidelying to sit: Mod assist       General bed mobility comments: assist for trunk elevation due to pain, increasd time.  Transfers Overall transfer level: Needs assistance Equipment used: Rolling walker (2 wheeled) Transfers: Sit to/from Stand Sit to Stand: Mod assist         General transfer comment: increased time, assist to transition to walker due to pain  Ambulation/Gait Ambulation/Gait assistance: Min assist Ambulation Distance (Feet): 40 Feet Assistive device: Rolling walker (2 wheeled) Gait Pattern/deviations: Step-through pattern;Trunk flexed Gait velocity: very slow Gait velocity interpretation: <1.8 ft/sec, indicative of risk for recurrent falls General Gait Details: pt with some knee buckling requiring pt to sit due to back pain  Stairs            Wheelchair Mobility    Modified Rankin (Stroke Patients Only)       Balance           Standing balance support:  Bilateral upper extremity supported Standing balance-Leahy Scale: Poor Standing balance comment: needs RW due to back pain                             Pertinent Vitals/Pain 9/10 back pain    Home Living Family/patient expects to be discharged to::  ([facility])                 Additional Comments: pt was using RW for short distance amb, staff was setting pt up for bathing    Prior Function Level of Independence: Needs assistance   Gait / Transfers Assistance Needed: used RW  ADL's / Homemaking Assistance Needed: assist for bathing        Hand Dominance   Dominant Hand: Right    Extremity/Trunk Assessment   Upper Extremity Assessment: Generalized weakness (due to back pain, limited ability to tolerate MMT)           Lower Extremity Assessment: Generalized weakness (due to back pain, limited ability to tolerate MMT)      Cervical / Trunk Assessment: Normal  Communication   Communication: No difficulties  Cognition Arousal/Alertness: Awake/alert Behavior During Therapy: WFL for tasks assessed/performed Overall Cognitive Status: Within Functional Limits for tasks assessed                      General Comments      Exercises        Assessment/Plan    PT Assessment Patient needs continued PT services  PT Diagnosis  Difficulty walking;Acute pain;Generalized weakness   PT Problem List Decreased strength;Decreased activity tolerance;Decreased balance;Decreased mobility;Pain;Cardiopulmonary status limiting activity;Decreased knowledge of use of DME  PT Treatment Interventions DME instruction;Gait training;Functional mobility training;Therapeutic activities;Patient/family education   PT Goals (Current goals can be found in the Care Plan section) Acute Rehab PT Goals PT Goal Formulation: With patient Time For Goal Achievement: 12/18/13 Potential to Achieve Goals: Good    Frequency Min 3X/week   Barriers to discharge         Co-evaluation               End of Session Equipment Utilized During Treatment: Gait belt Activity Tolerance: Patient limited by pain Patient left: in chair;with call bell/phone within reach Nurse Communication: Mobility status         Time: 6213-0865 PT Time Calculation (min): 31 min   Charges:   PT Evaluation $Initial PT Evaluation Tier I: 1 Procedure PT Treatments $Gait Training: 8-22 mins   PT G CodesKingsley Callander 12/11/2013, 12:47 PM  Kittie Plater, PT, DPT Pager #: [phone] Office #: [phone]

## 2013-12-11 NOTE — Sedation Documentation (Signed)
Pt still having back pain 91/2 out of10 per pt.  Dr Estanislado Pandy requested fentanyl 25 mcg given IV.

## 2013-12-11 NOTE — Consult Note (Signed)
[facility] for Infectious Disease    Date of Admission:  12/10/2013  Date of Consult:  12/11/2013  Reason for Consult:Diskitis  Referring Physician: Dr. Candiss Norse   HPI: Samuel Schultz is an 38 y.o. male.  with HIV/AIDS, hx of large cell lymphoma, who was found to have polymicrobial bactermia/fungemia with AMP sensitive Enterococcus in blood cultures on 11/10/2013 and Candida in one blood culture on 11/11/2013 and vancomycin resistant enterococcus in the other blood culture on 11/11/2013. A transesophageal echocardiogram which did not show any evidence of vegetation. He was ultimately converted over to daptomycin along with fluconazole and was sent, with a stop date of 10/25/ 2015. He came to the ER with progressive weakness chills riders. He was unable to walk due to severe pain in his back MRI done at [facility] showed new progressive L3-L4 discitis and osteomyelitis but no abscess. Of note he had an MRI done while he was an inpatient due to some back pain and this had failed to show the discitis and osteomyelitis which is emerged. He was given a dose of vancomycin at [facility] regional prior to transfer to [place]. We were called in infectious disease and asked for his antibiotics to please be held so that an IR guided aspirate could be obtained from his disc space. This will be done today. Note he also has been taking Bactrim for PCP prophylaxis and azithromycin weekly for Mycobacterium avium prophylaxis   Past Medical History  Diagnosis Date  . Cancer   . Lymphoma   . HIV disease   . Cirrhosis   . PTSD (post-traumatic stress disorder)   . Depressive disorder     Past Surgical History  Procedure Laterality Date  . Cholecystectomy    . Appendectomy    . Tee without cardioversion N/A 11/13/2013    Procedure: TRANSESOPHAGEAL ECHOCARDIOGRAM (TEE);  Surgeon: Dorothy Spark, MD;  Location: [facility];  Service: Cardiovascular;  Laterality: N/A;  ergies:   Allergies  Allergen  Reactions  . Codeine Anaphylaxis, Hives, Nausea Only, Swelling and Nausea And Vomiting  . Oxycodone     Other reaction(s): Dizziness Lightheaded, nausea     Medications: I have reviewed patients current medications as documented in Epic Anti-infectives   Start     Dose/Rate Route Frequency Ordered Stop   12/15/13 1000  azithromycin (ZITHROMAX) tablet 1,200 mg     1,200 mg Oral Every Fri 12/10/13 2050     12/11/13 1000  atovaquone (MEPRON) 750 MG/5ML suspension 1,500 mg     1,500 mg Oral Daily 12/10/13 2050     12/11/13 0800  Darunavir Ethanolate (PREZISTA) tablet 800 mg     800 mg Oral Daily with breakfast 12/10/13 2050     12/11/13 0800  ritonavir (NORVIR) capsule 100 mg     100 mg Oral Daily with breakfast 12/10/13 2050     12/10/13 2200  emtricitabine-tenofovir (TRUVADA) 200-300 MG per tablet 1 tablet     1 tablet Oral Daily at bedtime 12/10/13 2050        Social History:  reports that he has never smoked. He has never used smokeless tobacco. He reports that he does not drink alcohol or use illicit drugs.  Family History  Problem Relation Age of Onset  . Cancer Maternal Grandmother     breast ca  . Cancer Maternal Grandfather     colon ca    As in HPI and primary teams notes otherwise 12 point review of systems is negative  Blood  pressure 117/59, pulse 94, temperature 96.8 F (36 C), temperature source Axillary, resp. rate 15, height 5' 9"  (1.753 m), weight 195 lb (88.451 kg), SpO2 97.00%. General: Alert and awake, oriented x3, pale coarse voice and clearly in pain HEENT: anicteric sclera, pupils reactive to light and accommodation, EOMI, oropharynx clear and without exudate CVS regular rate, normal r,  no murmur rubs or gallops Chest: clear to auscultation bilaterally, no wheezing, rales or rhonchi Abdomen: soft nontender, nondistended, normal bowel sounds, Extremities: no  clubbing or edema noted bilaterally Skin: no rashes Neuro: No focal deficits but has severe  pain with trying to raise his right leg in particular.   Results for orders placed during the hospital encounter of 12/10/13 (from the past 48 hour(s))  COMPREHENSIVE METABOLIC PANEL     Status: Abnormal   Collection Time    12/10/13  8:50 PM      Result Value Ref Range   Sodium 122 (*) 137 - 147 mEq/L   Potassium 4.5  3.7 - 5.3 mEq/L   Chloride 88 (*) 96 - 112 mEq/L   CO2 22  19 - 32 mEq/L   Glucose, Bld 103 (*) 70 - 99 mg/dL   BUN 24 (*) 6 - 23 mg/dL   Creatinine, Ser 0.86  0.50 - 1.35 mg/dL   Calcium 9.5  8.4 - 10.5 mg/dL   Total Protein 8.3  6.0 - 8.3 g/dL   Albumin 2.8 (*) 3.5 - 5.2 g/dL   AST 82 (*) 0 - 37 U/L   ALT 67 (*) 0 - 53 U/L   Alkaline Phosphatase 123 (*) 39 - 117 U/L   Total Bilirubin 4.7 (*) 0.3 - 1.2 mg/dL   GFR calc non Af Amer >90  >90 mL/min   GFR calc Af Amer >90  >90 mL/min   Comment: (NOTE)     The eGFR has been calculated using the CKD EPI equation.     This calculation has not been validated in all clinical situations.     eGFR's persistently <90 mL/min signify possible Chronic Kidney     Disease.   Anion gap 12  5 - 15  TSH     Status: None   Collection Time    12/10/13  8:50 PM      Result Value Ref Range   TSH 2.520  0.350 - 4.500 uIU/mL  PROTIME-INR     Status: Abnormal   Collection Time    12/10/13  8:50 PM      Result Value Ref Range   Prothrombin Time 18.0 (*) 11.6 - 15.2 seconds   INR 1.49  0.00 - 1.49  TYPE AND SCREEN     Status: None   Collection Time    12/10/13 10:30 PM      Result Value Ref Range   ABO/RH(D) A POS     Antibody Screen NEG     Sample Expiration 12/13/2013    COMPREHENSIVE METABOLIC PANEL     Status: Abnormal   Collection Time    12/11/13  5:00 AM      Result Value Ref Range   Sodium 124 (*) 137 - 147 mEq/L   Potassium 4.5  3.7 - 5.3 mEq/L   Chloride 92 (*) 96 - 112 mEq/L   CO2 23  19 - 32 mEq/L   Glucose, Bld 86  70 - 99 mg/dL   BUN 21  6 - 23 mg/dL   Creatinine, Ser 0.90  0.50 - 1.35 mg/dL   Calcium  9.1   8.4 - 10.5 mg/dL   Total Protein 8.0  6.0 - 8.3 g/dL   Albumin 2.7 (*) 3.5 - 5.2 g/dL   AST 79 (*) 0 - 37 U/L   ALT 69 (*) 0 - 53 U/L   Alkaline Phosphatase 117  39 - 117 U/L   Total Bilirubin 4.3 (*) 0.3 - 1.2 mg/dL   GFR calc non Af Amer >90  >90 mL/min   GFR calc Af Amer >90  >90 mL/min   Comment: (NOTE)     The eGFR has been calculated using the CKD EPI equation.     This calculation has not been validated in all clinical situations.     eGFR's persistently <90 mL/min signify possible Chronic Kidney     Disease.   Anion gap 9  5 - 15  CBC     Status: Abnormal   Collection Time    12/11/13  5:00 AM      Result Value Ref Range   WBC 4.3  4.0 - 10.5 K/uL   RBC 3.10 (*) 4.22 - 5.81 MIL/uL   Hemoglobin 11.3 (*) 13.0 - 17.0 g/dL   HCT 31.6 (*) 39.0 - 52.0 %   MCV 101.9 (*) 78.0 - 100.0 fL   MCH 36.5 (*) 26.0 - 34.0 pg   MCHC 35.8  30.0 - 36.0 g/dL   RDW 15.7 (*) 11.5 - 15.5 %   Platelets 119 (*) 150 - 400 K/uL   Comment: CONSISTENT WITH PREVIOUS RESULT  PROTIME-INR     Status: Abnormal   Collection Time    12/11/13 10:35 AM      Result Value Ref Range   Prothrombin Time 18.4 (*) 11.6 - 15.2 seconds   INR 1.53 (*) 0.00 - 1.49  OSMOLALITY     Status: Abnormal   Collection Time    12/11/13 10:57 AM      Result Value Ref Range   Osmolality 270 (*) 275 - 300 mOsm/kg   Comment: Performed at [facility]   @BRIEFLABTABLE (sdes,specrequest,cult,reptstatus)   ) Recent Results (from the past 720 hour(s))  CULTURE, BLOOD (ROUTINE X 2)     Status: None   Collection Time    11/11/13  8:25 PM      Result Value Ref Range Status   Specimen Description BLOOD RIGHT ARM   Final   Special Requests BOTTLES DRAWN AEROBIC AND ANAEROBIC 5CC EACH   Final   Culture  Setup Time     Final   Value: 11/12/2013 01:05     Performed at [facility]   Culture     Final   Value: VANCOMYCIN RESISTANT ENTEROCOCCUS ISOLATED     Note: CRITICAL RESULT CALLED TO, READ BACK BY AND  VERIFIED WITH: BRENDA HALL 11/15/13 0815 BY SMITHERSJ DAPTOMYCIN=3 UG/ML=SENSITIVE LINEZOLID RESULTS PROVIDED BY [facility]     Note: Gram Stain Report Called to,Read Back By and Verified With: FRANCIS PLEASANT 11/12/13 @ 8:25PM BY RUSCOE A.     Performed at [facility]   Report Status 12/06/2013 FINAL   Final   Organism ID, Bacteria VANCOMYCIN RESISTANT ENTEROCOCCUS ISOLATED   Final  CULTURE, BLOOD (ROUTINE X 2)     Status: None   Collection Time    11/11/13  8:35 PM      Result Value Ref Range Status   Specimen Description BLOOD LEFT HAND   Final   Special Requests BOTTLES DRAWN AEROBIC ONLY [place]   Final   Culture  Setup Time     Final   Value: 11/12/2013 01:05     Performed at [facility]   Culture     Final   Value: NO GROWTH 5 DAYS     Performed at [facility]   Report Status 11/18/2013 FINAL   Final  URINE CULTURE     Status: None   Collection Time    11/12/13 11:22 AM      Result Value Ref Range Status   Specimen Description URINE, CLEAN CATCH   Final   Special Requests Immunocompromised   Final   Culture  Setup Time     Final   Value: 11/12/2013 23:37     Performed at [place]     Final   Value: NO GROWTH     Performed at [facility]   Culture     Final   Value: NO GROWTH     Performed at [facility]   Report Status 11/13/2013 FINAL   Final  CULTURE, BLOOD (ROUTINE X 2)     Status: None   Collection Time    11/17/13  1:00 PM      Result Value Ref Range Status   Specimen Description BLOOD LEFT ANTECUBITAL   Final   Special Requests BOTTLES DRAWN AEROBIC AND ANAEROBIC 10CC   Final   Culture  Setup Time     Final   Value: 11/17/2013 16:48     Performed at [facility]   Culture     Final   Value: NO GROWTH 5 DAYS     Performed at [facility]   Report Status 11/23/2013 FINAL   Final  CULTURE, BLOOD (ROUTINE X 2)     Status: None   Collection Time    11/17/13  1:10 PM       Result Value Ref Range Status   Specimen Description BLOOD RIGHT HAND   Final   Special Requests BOTTLES DRAWN AEROBIC ONLY 2CC   Final   Culture  Setup Time     Final   Value: 11/17/2013 16:48     Performed at [facility]   Culture     Final   Value: NO GROWTH 5 DAYS     Performed at [facility]   Report Status 11/23/2013 FINAL   Final     Impression/Recommendation  Active Problems:   AIDS   Large cell lymphoma   Cirrhosis, non-alcoholic   Protein-calorie malnutrition, severe   Discitis   Samuel Schultz is a 38 y.o. male with  HIV-AIDS, large B-cell lymphoma recently admitted to town with ampicillin sensitive enterococcal bacteremia but then found to have VRE and candida on surveillance cultures, treated with fluconazole and daptomycin for 14 days after negative blood cultures and with a negative TEE. 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 He got a dose of vancomycin at [facility] unfortunately prior to transfer here.  #1 osteomyelitis with vertebral infection and discitis:  --Continue to hold antibiotics and followup cultures  --If initial cultures are negative consider repeat aspirate from the disc space and sent for bacterial cultures.  --I am most suspicious for AMP sensitive ENterococcus and think that the VRE and Candida Could have been contaminants that we saw before (though his family do not want to risk  of not treating them.)  #2 HIV/AIDS: continue as Prezista Norvir Truvada, will resume prophylactic antibiotics after we have nailed down what is going on in his vertebral osteomyelitis.  I spent greater than 40 minutes with the patient including greater than 50% of time in face to face counsel of the patient and in coordination of their care.    12/11/2013, 5:04 PM   Thank you so much for this interesting  consult  [place] [facility] [place] [phone] (pager) [phone] (office) 12/11/2013, 5:04 PM  Rhina [place] 12/11/2013, 5:04 PM

## 2013-12-12 ENCOUNTER — Telehealth: Payer: Self-pay | Admitting: Hematology and Oncology

## 2013-12-12 ENCOUNTER — Encounter (HOSPITAL_COMMUNITY): Payer: Self-pay | Admitting: *Deleted

## 2013-12-12 ENCOUNTER — Inpatient Hospital Stay: Payer: Medicaid Other | Admitting: Internal Medicine

## 2013-12-12 DIAGNOSIS — M519 Unspecified thoracic, thoracolumbar and lumbosacral intervertebral disc disorder: Secondary | ICD-10-CM

## 2013-12-12 DIAGNOSIS — C8589 Other specified types of non-Hodgkin lymphoma, extranodal and solid organ sites: Secondary | ICD-10-CM

## 2013-12-12 DIAGNOSIS — E440 Moderate protein-calorie malnutrition: Secondary | ICD-10-CM | POA: Insufficient documentation

## 2013-12-12 LAB — URINE CULTURE
CULTURE: NO GROWTH
Colony Count: NO GROWTH

## 2013-12-12 LAB — CBC
HEMATOCRIT: 30.2 % — AB (ref 39.0–52.0)
HEMOGLOBIN: 10.8 g/dL — AB (ref 13.0–17.0)
MCH: 37.2 pg — AB (ref 26.0–34.0)
MCHC: 35.8 g/dL (ref 30.0–36.0)
MCV: 104.1 fL — AB (ref 78.0–100.0)
Platelets: 109 10*3/uL — ABNORMAL LOW (ref 150–400)
RBC: 2.9 MIL/uL — AB (ref 4.22–5.81)
RDW: 15.9 % — ABNORMAL HIGH (ref 11.5–15.5)
WBC: 4.4 10*3/uL (ref 4.0–10.5)

## 2013-12-12 LAB — BASIC METABOLIC PANEL
Anion gap: 9 (ref 5–15)
BUN: 17 mg/dL (ref 6–23)
CHLORIDE: 93 meq/L — AB (ref 96–112)
CO2: 23 meq/L (ref 19–32)
CREATININE: 0.89 mg/dL (ref 0.50–1.35)
Calcium: 9 mg/dL (ref 8.4–10.5)
GFR calc non Af Amer: >90 mL/min (ref >90)
GLUCOSE: 93 mg/dL (ref 70–99)
POTASSIUM: 5.4 meq/L — AB (ref 3.7–5.3)
Sodium: 125 mEq/L — ABNORMAL LOW (ref 137–147)

## 2013-12-12 LAB — MAGNESIUM: MAGNESIUM: 1.8 mg/dL (ref 1.5–2.5)

## 2013-12-12 LAB — OSMOLALITY, URINE: OSMOLALITY UR: 337 mosm/kg — AB (ref 390–1090)

## 2013-12-12 MED ORDER — BOOST / RESOURCE BREEZE PO LIQD
1.0000 | Freq: Two times a day (BID) | ORAL | Status: DC
  Administered 2013-12-12 – 2013-12-19 (×14): 1 via ORAL

## 2013-12-12 MED ORDER — FUROSEMIDE 10 MG/ML IJ SOLN
20.0000 mg | Freq: Once | INTRAMUSCULAR | Status: AC
  Administered 2013-12-12: 20 mg via INTRAVENOUS
  Filled 2013-12-12: qty 2

## 2013-12-12 MED ORDER — BOOST PLUS PO LIQD
237.0000 mL | Freq: Two times a day (BID) | ORAL | Status: DC
  Administered 2013-12-12 – 2013-12-17 (×8): 237 mL via ORAL
  Filled 2013-12-12 (×2): qty 237
  Filled 2013-12-12: qty 1
  Filled 2013-12-12: qty 237
  Filled 2013-12-12: qty 1
  Filled 2013-12-12 (×14): qty 237

## 2013-12-12 MED ORDER — SODIUM POLYSTYRENE SULFONATE 15 GM/60ML PO SUSP
30.0000 g | Freq: Once | ORAL | Status: AC
  Administered 2013-12-12: 30 g via ORAL
  Filled 2013-12-12: qty 120

## 2013-12-12 NOTE — Progress Notes (Addendum)
     Samuel Schultz was admitted to the Hospital on 12/10/2013 and Discharged  12/12/2013 and should be excused from court duty/work/school  for 7 days starting 12/10/2013 , is currently hospitalized.  Call Lala Lund MD, [facility] [phone] with questions.  Thurnell Lose M.D on 12/12/2013,at 10:34 AM  [facility]   Office  [phone]

## 2013-12-12 NOTE — Progress Notes (Signed)
[facility] for Infectious Disease    Subjective: Still complaining of back pain   Antibiotics:  Anti-infectives   Start     Dose/Rate Route Frequency Ordered Stop   12/15/13 1000  azithromycin (ZITHROMAX) tablet 1,200 mg     1,200 mg Oral Every Fri 12/10/13 2050     12/11/13 1000  atovaquone (MEPRON) 750 MG/5ML suspension 1,500 mg     1,500 mg Oral Daily 12/10/13 2050     12/11/13 0800  Darunavir Ethanolate (PREZISTA) tablet 800 mg     800 mg Oral Daily with breakfast 12/10/13 2050     12/11/13 0800  ritonavir (NORVIR) capsule 100 mg     100 mg Oral Daily with breakfast 12/10/13 2050     12/10/13 2200  emtricitabine-tenofovir (TRUVADA) 200-300 MG per tablet 1 tablet     1 tablet Oral Daily at bedtime 12/10/13 2050        Medications: Scheduled Meds: . atovaquone  1,500 mg Oral Daily  . [START ON 12/15/2013] azithromycin  1,200 mg Oral Q Fri  . cyclobenzaprine  5 mg Oral QHS  . Darunavir Ethanolate  800 mg Oral Q breakfast  . docusate sodium  100 mg Oral BID  . emtricitabine-tenofovir  1 tablet Oral QHS  . feeding supplement (RESOURCE BREEZE)  1 Container Oral BID PC  . gabapentin  300 mg Oral TID  . lactose free nutrition  237 mL Oral BID  . morphine  15 mg Oral BID  . pantoprazole  40 mg Oral BID  . phytonadione  10 mg Oral Daily  . ritonavir  100 mg Oral Q breakfast  . sodium chloride  3 mL Intravenous Q12H  . sucralfate  1 g Oral TID WC   Continuous Infusions:  PRN Meds:.acetaminophen, acetaminophen, HYDROmorphone (DILAUDID) injection, ondansetron (ZOFRAN) IV, ondansetron    Objective: Weight change:   Intake/Output Summary (Last 24 hours) at 12/12/13 1447 Last data filed at 12/12/13 0600  Gross per 24 hour  Intake   2850 ml  Output   1000 ml  Net   1850 ml   Blood pressure 113/56, pulse 89, temperature 98.1 F (36.7 C), temperature source Oral, resp. rate 18, height 5\' 9"  (1.753 m), weight 195 lb (88.451 kg), SpO2 96.00%. Temp:  [97.6 F (36.4  C)-98.1 F (36.7 C)] 98.1 F (36.7 C) (07/07 1300) Pulse Rate:  [89-95] 89 (07/07 1300) Resp:  [18-20] 18 (07/07 1300) BP: (112-120)/(50-58) 113/56 mmHg (07/07 1300) SpO2:  [96 %-99 %] 96 % (07/07 1300)  Physical Exam: General: Alert and awake, oriented x3, pale coarse voice and clearly in pain  HEENT: anicteric sclera, pupils reactive to light and accommodation, EOMI, oropharynx clear and without exudate  CVS regular rate, normal r, no murmur rubs or gallops  Chest: clear to auscultation bilaterally, no wheezing, rales or rhonchi  Abdomen: soft nontender, nondistended, normal bowel sounds,  Extremities: no clubbing or edema noted bilaterally  Skin: no rashes  Neuro: No focal deficits but has severe pain with trying to raise his right leg in particular  CBC:  Recent Labs Lab 12/10/13 2050 12/11/13 0500 12/11/13 1035 12/12/13 0800  HGB  --  11.3*  --  10.8*  HCT  --  31.6*  --  30.2*  PLT  --  119*  --  109*  INR 1.49  --  1.53*  --      BMET  Recent Labs  12/11/13 0500 12/12/13 0800  NA 124* 125*  K 4.5 5.4*  CL 92* 93*  CO2 23 23  GLUCOSE 86 93  BUN 21 17  CREATININE 0.90 0.89  CALCIUM 9.1 9.0     Liver Panel   Recent Labs  12/10/13 2050 12/11/13 0500  PROT 8.3 8.0  ALBUMIN 2.8* 2.7*  AST 82* 79*  ALT 67* 69*  ALKPHOS 123* 117  BILITOT 4.7* 4.3*       Sedimentation Rate No results found for this basename: ESRSEDRATE,  in the last 72 hours C-Reactive Protein No results found for this basename: CRP,  in the last 72 hours  Micro Results: Recent Results (from the past 720 hour(s))  CULTURE, BLOOD (ROUTINE X 2)     Status: None   Collection Time    11/17/13  1:00 PM      Result Value Ref Range Status   Specimen Description BLOOD LEFT ANTECUBITAL   Final   Special Requests BOTTLES DRAWN AEROBIC AND ANAEROBIC 10CC   Final   Culture  Setup Time     Final   Value: 11/17/2013 16:48     Performed at [facility]   Culture     Final    Value: NO GROWTH 5 DAYS     Performed at [facility]   Report Status 11/23/2013 FINAL   Final  CULTURE, BLOOD (ROUTINE X 2)     Status: None   Collection Time    11/17/13  1:10 PM      Result Value Ref Range Status   Specimen Description BLOOD RIGHT HAND   Final   Special Requests BOTTLES DRAWN AEROBIC ONLY 2CC   Final   Culture  Setup Time     Final   Value: 11/17/2013 16:48     Performed at [facility]   Culture     Final   Value: NO GROWTH 5 DAYS     Performed at [facility]   Report Status 11/23/2013 FINAL   Final  FUNGUS CULTURE W SMEAR     Status: None   Collection Time    12/11/13 12:44 PM      Result Value Ref Range Status   Specimen Description WOUND   Final   Special Requests LUMBAR 4 AND 5 DISC   Final   Fungal Smear     Final   Value: NO YEAST OR FUNGAL ELEMENTS SEEN     Performed at [facility]   Culture     Final   Value: CULTURE IN PROGRESS FOR FOUR WEEKS     Performed at [facility]   Report Status PENDING   Incomplete    Studies/Results: No results found.    Assessment/Plan:  Active Problems:   AIDS   Large cell lymphoma   Cirrhosis, non-alcoholic   Protein-calorie malnutrition, severe   Discitis   Malnutrition of moderate degree    Cisco Kindt is a 38 y.o. 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 He got a dose of vancomycin and another antibiotic he thinks at [facility] unfortunately prior to transfer here. He's undergone I are guided biopsy yesterday. I  called the  microbiology lab and X. he had processed AFB and fungal cultures but had not even set up a tissue culture. I reordered tissue culture and they should be adding this for setup today   #1 osteomyelitis with vertebral infection and discitis:  --Continue to hold antibiotics and followup cultures , see above discussion guarding problems with the micro-lab setting up his tissue cultures hopefully that'll be set up properly.   --If initial cultures are negative consider repeat aspirate from the disc space and RE-SEND for bacterial cultures.   --I am most suspicious for AMP sensitive ENterococcus and think that the VRE and Candida Could have been contaminants that we saw before (though his family do not want to risk of not treating them.)   #2 HIV/AIDS: continue as Prezista Norvir Truvada, will resume prophylactic antibiotics after we have nailed down what is going on in his vertebral osteomyelitis.         LOS: 2 days   Alcide Evener 12/12/2013, 2:47 PM

## 2013-12-12 NOTE — Telephone Encounter (Signed)
Faxed pt medical records to [facility]. The nurse will call with appt.

## 2013-12-12 NOTE — Progress Notes (Signed)
Patient Demographics  Samuel Schultz, is a 38 y.o. male, DOB - July 30, 1975, MCN:[id number]  Admit date - 12/10/2013   Admitting Physician Domenic Polite, MD  Outpatient Primary MD for the patient is [name], MD  LOS - 2   No chief complaint on file.       HPI:  39 y.o. male is here because of history of diffuse large B cell lymphoma, stage IV disease and was treated with R. 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 He was continued on Cubicin and fluconazole through 6/24, 14 days after his last negative culture.   During the same admission he also complained of right-sided lower back pain and leg pain, MRI was found to be negative for discitis . He was started on Flexeril and MS Contin for back pain. He has been progressively getting weaker, has had chills,rigors,, no documented fever at the nursing home. The patient is unable to ambulate because of weakness and pain in his back. He had an MRI done at [facility] ED that showed new/progressive L3-L4 discitis/osteomyelitis but no abscess. He also had  WBC count of 6.8, a sodium of 121.   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        Subjective:   [name] today has, No headache, No chest pain, No abdominal pain - No Nausea, No new weakness tingling or numbness, No Cough - SOB. ++ Low back pain.      Assessment & Plan    1. Back Pain - per MRI done at [facility] on 12/10/2013 patient has discitis/os to mellitus without the evidence of frank abscess at L3-L4. Case was discussed with ID who are on board, currently planned to hold antibiotics, post CT-guided needle aspiration on 12/11/2013 by IR for targeted culture and sensitivity. ID following. Continue supportive care for now.    2. AIDS - is to be followed by ID in the outpatient setting had a pending appointment. Continue outpatient medications for now. ID to follow.   On azithromycin for Mycobacterium avium prophylaxis, currently not on Bactrim. ID following.     3. History of advanced Large cell lymphoma with pancytopenia (Cirrhosis) - being followed by Dr. Alvy Bimler and [place] in the outpatient setting.     4. 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     5. Depression on Lexapro.  6. Hyponatremia. Serum osmolality is low, he appears to be in mild fluid overload actually, stop IV fluids, urine sodium is 59, urine osmolality is pending, will give him a trial of IV Lasix and monitor.    7. Mild hyperkalemia. Hold Aldactone today, IV Lasix, Kayexalate oral, repeat BMP in the morning.      Code Status: full  Family Communication: none present  Disposition Plan:  TBD   Procedures   MRI [place] spine - diskitis   Consults  IR, ID   Medications  Scheduled Meds: . atovaquone  1,500 mg Oral Daily  . [START ON 12/15/2013] azithromycin  1,200 mg Oral Q Fri  . cyclobenzaprine  5 mg Oral QHS  . Darunavir Ethanolate  800 mg Oral Q breakfast  . docusate sodium  100 mg Oral BID  . emtricitabine-tenofovir  1 tablet Oral QHS  . gabapentin  300 mg Oral TID  . morphine  15 mg Oral BID  . pantoprazole  40 mg Oral BID  . phytonadione  10 mg Oral Daily  . ritonavir  100 mg Oral Q breakfast  . sodium chloride  3 mL Intravenous Q12H  . spironolactone  50 mg Oral Daily  . sucralfate  1 g Oral TID WC   Continuous Infusions: . sodium chloride 75 mL/hr at 12/12/13 0853   PRN Meds:.acetaminophen, acetaminophen, HYDROmorphone (DILAUDID) injection, ondansetron (ZOFRAN) IV, ondansetron  DVT Prophylaxis  SCDs    Lab Results  Component Value Date   PLT 109* 12/12/2013    Antibiotics    Anti-infectives   Start     Dose/Rate Route Frequency Ordered Stop   12/15/13 1000  azithromycin (ZITHROMAX) tablet 1,200 mg     1,200 mg Oral Every Fri 12/10/13 2050     12/11/13 1000  atovaquone (MEPRON) 750 MG/5ML suspension 1,500 mg     1,500 mg Oral Daily 12/10/13 2050     12/11/13 0800  Darunavir Ethanolate (PREZISTA) tablet 800 mg     800 mg Oral Daily with breakfast 12/10/13 2050     12/11/13 0800  ritonavir (NORVIR) capsule 100 mg     100 mg Oral Daily with breakfast 12/10/13 2050     12/10/13 2200  emtricitabine-tenofovir (TRUVADA) 200-300 MG per tablet 1 tablet     1 tablet Oral Daily at bedtime 12/10/13 2050            Objective:   Filed Vitals:   12/11/13 1250 12/11/13 1322 12/11/13 2114 12/12/13 0605  BP: 128/52 117/59 112/50 120/58  Pulse: 100 94 95 93  Temp:  96.8 F (36 C) 97.6 F (36.4 C) 97.7 F (36.5 C)  TempSrc:  Axillary Oral Oral  Resp: 16 15 18 20   Height:      Weight:      SpO2: 98% 97% 98% 99%    Wt Readings from Last 3  Encounters:  12/10/13 88.451 kg (195 lb)  12/05/13 87.68 kg (193 lb 4.8 oz)  11/23/13 99.111 kg (218 lb 8 oz)     Intake/Output Summary (Last 24 hours) at 12/12/13 1030 Last data filed at 12/12/13 0600  Gross per 24 hour  Intake   2850 ml  Output   1000 ml  Net   1850 ml     Physical Exam  Awake Alert, Oriented X 3, No new F.N deficits, Normal affect [place].AT,PERRAL Supple Neck,No JVD, No cervical lymphadenopathy appriciated.  Symmetrical Chest wall movement, Good air movement bilaterally, CTAB RRR,No Gallops,Rubs or new Murmurs, No Parasternal Heave +  ve B.Sounds, Abd Soft, No tenderness, No organomegaly appriciated, No rebound - guarding or rigidity. No Cyanosis, Clubbing or edema, No new Rash or bruise      Data Review   Micro Results No results found for this or any previous visit (from the past 240 hour(s)).  Radiology Reports Dg Abd 1 View  11/12/2013   CLINICAL DATA:  Colitis.  EXAM: ABDOMEN - 1 VIEW  COMPARISON:  CT of the abdomen and pelvis 11/10/2013.  FINDINGS: Oral contrast material is noted throughout the colon and distal rectum. No pathologic distention of small bowel. No pneumoperitoneum. Multiple surgical clips project over the right upper quadrant of the abdomen. Splenic contour appears enlarged.  IMPRESSION: 1. Nonobstructive bowel gas pattern. 2. No pneumoperitoneum. 3. Splenomegaly.   Electronically Signed   By: Vinnie Langton M.D.   On: 11/12/2013 11:47   Mr Lumbar Spine W Wo Contrast  11/15/2013   CLINICAL DATA:  38 year old male with bacteremia and new back pain. Severe low back pain radiating to the right lower extremity. Initial encounter. HIV, cirrhosis, lymphoma.  EXAM: MRI LUMBAR SPINE WITHOUT AND WITH CONTRAST  TECHNIQUE: Multiplanar and multiecho pulse sequences of the lumbar spine were obtained without and with intravenous contrast.  CONTRAST:  75mL MULTIHANCE GADOBENATE DIMEGLUMINE 529 MG/ML IV SOLN  COMPARISON:  CT Abdomen and Pelvis 11/10/2013 and  earlier.  FINDINGS: Normal lumbar segmentation depicted on comparison.  Diffuse loss of normal bone marrow signal in the visible spine, and left iliac bone. However, no marrow edema or destructive osseous lesion identified. No abnormal vertebral enhancement in the spine.  Visualized lower thoracic spinal cord is normal with conus medularis at L1. Normal cauda equina nerve roots. No abnormal intradural enhancement.  There is mild abnormal STIR and T2 signal in the lower right erector spinae muscles, at the L4 and L5 level (series 5, image 1 and series 6, image 26). This is nonspecific. No definite abnormal enhancement, and other visible paraspinal muscles appear normal. There is mild subcutaneous edema in the lumbar spine, significance doubtful in this setting.  Much of the abdominal viscera are obscured on these images.  T11-T12: Grossly normal.  T12-L1:  Negative.  L1-L2:  Negative.  L2-L3:  Negative.  L3-L4: Mild disc space loss and disc desiccation. Mild circumferential disc osteophyte complex. Mild facet hypertrophy. No significant stenosis.  L4-L5:  Negative except for mild facet hypertrophy.  L5-S1: Disc space loss with disc desiccation and right eccentric circumferential disc osteophyte complex. Focal central component (series 6, image 31), but no spinal or definite lateral recess stenosis. No foraminal stenosis.  IMPRESSION: 1. Diffuse loss of normal bone marrow signal, but may be post treatment affect rather than due to widespread osseous metastatic disease. No destructive osseous lesion identified. 2. No evidence of lumbar discitis osteomyelitis. Mild to moderate chronic appearing disc degeneration at L3-L4 and L5-S1. No spinal stenosis. 3. Mild signal abnormality in the right lower lumbar erector spinae musculature, nonspecific. Early infectious myositis is not excluded in this setting.   Electronically Signed   By: Lars Pinks M.D.   On: 11/15/2013 20:30   Ir Removal [employer] Access W/ Port W/o Fl Mod  Sed  11/16/2013   CLINICAL DATA:  History of bacteremia, concern for seeding of patient's chronic Port a Catheter.  EXAM: REMOVAL OF IMPLANTED TUNNELED PORT-A-CATH  MEDICATIONS: The patient is currently admitted to the hospital and receiving intravenous antibiotics; The antibiotic was administered within 1 hour prior to the start of the procedure.  ANESTHESIA/SEDATION:  Intravenous Versed, Fentanyl and Dilaudid was administered for conscious sedation  Sedation time: 20  FLUOROSCOPY TIME:  None  PROCEDURE: Informed written consent was obtained from the patient after a discussion of the risk, benefits and alternatives to the procedure. The patient was positioned supine on the fluoroscopy table and the right chest Port-A-Cath site was prepped with chlorhexidine. A sterile gown and gloves were worn during the procedure. Local anesthesia was provided with 1% lidocaine with epinephrine. A timeout was performed prior to the initiation of the procedure.  An incision was made overlying the Port-A-Cath with a #15 scalpel. Utilizing sharp and blunt dissection, the Port-A-Cath was removed completely. The pocked was irrigated with sterile saline. Wound closure was performed with subcutaneous 3-0 Monocryl, subcuticular 4-0 Vicryl and Dermabond. A dressing was placed. The patient tolerated the procedure well without immediate post procedural complication.  FINDINGS: Successful removal of implant Port-A-Cath without immediate post procedural complication.  Visual inspection of the Port a catheter pocket was negative for any definitive evidence of infection and as such, the incision was closed primarily with absorbable suture as above.  IMPRESSION: Successful removal of implanted Port-A-Cath.   Electronically Signed   By: Sandi Mariscal M.D.   On: 11/16/2013 16:57    CBC  Recent Labs Lab 12/11/13 0500 12/12/13 0800  WBC 4.3 4.4  HGB 11.3* 10.8*  HCT 31.6* 30.2*  PLT 119* 109*  MCV 101.9* 104.1*  MCH 36.5* 37.2*  MCHC  35.8 35.8  RDW 15.7* 15.9*    Chemistries   Recent Labs Lab 12/10/13 2050 12/11/13 0500 12/12/13 0800  NA 122* 124* 125*  K 4.5 4.5 5.4*  CL 88* 92* 93*  CO2 22 23 23   GLUCOSE 103* 86 93  BUN 24* 21 17  CREATININE 0.86 0.90 0.89  CALCIUM 9.5 9.1 9.0  MG  --   --  1.8  AST 82* 79*  --   ALT 67* 69*  --   ALKPHOS 123* 117  --   BILITOT 4.7* 4.3*  --    ------------------------------------------------------------------------------------------------------------------ estimated creatinine clearance is 123.8 ml/min (by C-G formula based on Cr of 0.89). ------------------------------------------------------------------------------------------------------------------ No results found for this basename: HGBA1C,  in the last 72 hours ------------------------------------------------------------------------------------------------------------------ No results found for this basename: CHOL, HDL, LDLCALC, TRIG, CHOLHDL, LDLDIRECT,  in the last 72 hours ------------------------------------------------------------------------------------------------------------------  Recent Labs  12/10/13 2050  TSH 2.520   ------------------------------------------------------------------------------------------------------------------ No results found for this basename: VITAMINB12, FOLATE, FERRITIN, TIBC, IRON, RETICCTPCT,  in the last 72 hours  Coagulation profile  Recent Labs Lab 12/10/13 2050 12/11/13 1035  INR 1.49 1.53*    No results found for this basename: DDIMER,  in the last 72 hours  Cardiac Enzymes No results found for this basename: CK, CKMB, TROPONINI, MYOGLOBIN,  in the last 168 hours ------------------------------------------------------------------------------------------------------------------ No components found with this basename: POCBNP,      Time Spent in minutes   35   Raford Brissett K M.D on 12/12/2013 at 10:30 AM  Between 7am to 7pm - Pager -  [phone]  After 7pm go to [email or web address] - password [email or web address]  And look for the night coverage person covering for me after hours  [facility] Office  [phone]   **Disclaimer: This note may have been dictated with voice recognition software. Similar sounding words can inadvertently be transcribed and this note may contain transcription errors which may not have been corrected upon publication of note.**

## 2013-12-12 NOTE — Progress Notes (Signed)
INITIAL NUTRITION ASSESSMENT  DOCUMENTATION CODES Per approved criteria  -Non-severe (moderate) malnutrition in the context of chronic illness  Pt meets criteria for MODERATE MALNUTRITION in the context of CHRONIC ILLNESS as evidenced by 8% weight loss in less than 2 months, mild to moderate muscle/fat wasting and estimated energy intake <75% of estimated energy requirements for >/= 1 month.   INTERVENTION: Provide Resource Breeze BID with meals Provide Boost Plus BID after meals Encourage PO   NUTRITION DIAGNOSIS: Inadequate oral intake related to poor appetite as evidenced by pt's report and 8% weight loss in less than 2 months.   Goal: Pt to meet >/= 90% of their estimated nutrition needs   Monitor:  PO intake, weight trend, labs  Reason for Assessment: Malnutrition Screening Tool, score of 4  38 y.o. male  Admitting Dx: <principal problem not specified>  ASSESSMENT: 38 y.o. male is here because of history of diffuse large B cell lymphoma, stage IV disease and was treated with R. 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 He has been progressively getting weaker, has had chills,rigors,, no documented fever at the nursing home. The patient is unable to ambulate because of weakness and pain in his back. He had an MRI done at [facility] ED that showed new/progressive L3-L4 discitis/osteomyelitis but no abscess.  Pt familiar to nutrition team from previous admission. During previous admission patient has nausea, vomiting, and diarrhea. On 6/15 symptoms improved and pt was able to tolerate 50-100% of meals. Pt reports usual body weight of 230 lbs. Pt reports that for the past month he has only been able to tolerate a few  bites at each meal. Weight history shows pt has had 8% weight loss within the past 2 months.  He reports feeling week and nauseous today; he was able to tolerate a few bites of his breakfast.   Nutrition Focused Physical Exam:  Subcutaneous Fat:  Orbital Region: wnl Upper Arm Region: mild wasting Thoracic and Lumbar Region: NA  Muscle:  Temple Region: moderate wasting Clavicle Bone Region: mild wasting Clavicle and Acromion Bone Region: wnl Scapular Bone Region: NA Dorsal Hand: wnl Patellar Region: mild wasting Anterior Thigh Region: mild wasting Posterior Calf Region: mild wasting  Edema: +2 RLE and LLE edema   Height: Ht Readings from Last 1 Encounters:  12/10/13 5\' 9"  (1.753 m)    Weight: Wt Readings from Last 1 Encounters:  12/10/13 195 lb (88.451 kg)    Ideal Body Weight: 160 lbs  % Ideal Body Weight: 122%  Wt Readings from Last 10 Encounters:  12/10/13 195 lb (88.451 kg)  12/05/13 193 lb 4.8 oz (87.68 kg)  11/23/13 218 lb 8 oz (99.111 kg)  11/23/13 218 lb 8 oz (99.111 kg)  10/16/13 212 lb 1.6 oz (96.208 kg)  11/06/13 203 lb  Usual Body Weight: 230 lbs  % Usual Body Weight: 85%  BMI:  Body mass index is 28.78 kg/(m^2).  Estimated Nutritional Needs: Kcal: 5462-7035 Protein: 105-120 grams Fluid: 2.4 L/day  Skin: +2 RLE and LLE edema; closed incision on lumbar  Diet Order: Cardiac  EDUCATION NEEDS: -No education needs identified at this time   Intake/Output Summary (Last 24 hours) at 12/12/13 1148 Last data filed at 12/12/13 0600  Gross per 24 hour  Intake   2850  ml  Output   1000 ml  Net   1850 ml    Last BM: 7/5  Labs:   Recent Labs Lab 12/10/13 2050 12/11/13 0500 12/12/13 0800  NA 122* 124* 125*  K 4.5 4.5 5.4*  CL 88* 92* 93*  CO2 22 23 23   BUN 24* 21 17  CREATININE 0.86 0.90 0.89  CALCIUM 9.5 9.1 9.0  MG  --   --  1.8  GLUCOSE 103* 86 93    CBG (last 3)  No results found for this basename: GLUCAP,  in the last 72  hours  Scheduled Meds: . atovaquone  1,500 mg Oral Daily  . [START ON 12/15/2013] azithromycin  1,200 mg Oral Q Fri  . cyclobenzaprine  5 mg Oral QHS  . Darunavir Ethanolate  800 mg Oral Q breakfast  . docusate sodium  100 mg Oral BID  . emtricitabine-tenofovir  1 tablet Oral QHS  . furosemide  20 mg Intravenous Once  . gabapentin  300 mg Oral TID  . morphine  15 mg Oral BID  . pantoprazole  40 mg Oral BID  . phytonadione  10 mg Oral Daily  . ritonavir  100 mg Oral Q breakfast  . sodium chloride  3 mL Intravenous Q12H  . sodium polystyrene  30 g Oral Once  . sucralfate  1 g Oral TID WC    Continuous Infusions:   Past Medical History  Diagnosis Date  . Cancer   . Lymphoma   . HIV disease   . Cirrhosis   . PTSD (post-traumatic stress disorder)   . Depressive disorder     Past Surgical History  Procedure Laterality Date  . Cholecystectomy    . Appendectomy    . Tee without cardioversion N/A 11/13/2013    Procedure: TRANSESOPHAGEAL ECHOCARDIOGRAM (TEE);  Surgeon: Dorothy Spark, MD;  Location: [place];  Service: Cardiovascular;  Laterality: N/A;    Pryor Ochoa RD, LDN Inpatient Clinical Dietitian Pager: [phone] After Hours Pager: [phone]

## 2013-12-12 NOTE — Telephone Encounter (Signed)
Pt appt. With Dr. Ma Hillock @ [facility] is 12/21/13@2 :50. Medical records faxed. Pt is aware

## 2013-12-13 ENCOUNTER — Inpatient Hospital Stay (HOSPITAL_COMMUNITY): Payer: Medicaid Other

## 2013-12-13 LAB — CBC
HCT: 27.7 % — ABNORMAL LOW (ref 39.0–52.0)
HEMOGLOBIN: 9.9 g/dL — AB (ref 13.0–17.0)
MCH: 37.4 pg — ABNORMAL HIGH (ref 26.0–34.0)
MCHC: 35.7 g/dL (ref 30.0–36.0)
MCV: 104.5 fL — ABNORMAL HIGH (ref 78.0–100.0)
Platelets: 72 10*3/uL — ABNORMAL LOW (ref 150–400)
RBC: 2.65 MIL/uL — ABNORMAL LOW (ref 4.22–5.81)
RDW: 15.9 % — AB (ref 11.5–15.5)
WBC: 3.1 10*3/uL — ABNORMAL LOW (ref 4.0–10.5)

## 2013-12-13 LAB — BASIC METABOLIC PANEL
ANION GAP: 9 (ref 5–15)
BUN: 17 mg/dL (ref 6–23)
CHLORIDE: 92 meq/L — AB (ref 96–112)
CO2: 24 mEq/L (ref 19–32)
CREATININE: 0.83 mg/dL (ref 0.50–1.35)
Calcium: 8.7 mg/dL (ref 8.4–10.5)
GFR calc non Af Amer: >90 mL/min (ref >90)
Glucose, Bld: 82 mg/dL (ref 70–99)
Potassium: 5 mEq/L (ref 3.7–5.3)
SODIUM: 125 meq/L — AB (ref 137–147)

## 2013-12-13 LAB — URIC ACID: URIC ACID, SERUM: 4.8 mg/dL (ref 4.0–7.8)

## 2013-12-13 LAB — MAGNESIUM: MAGNESIUM: 1.5 mg/dL (ref 1.5–2.5)

## 2013-12-13 MED ORDER — FUROSEMIDE 10 MG/ML IJ SOLN
20.0000 mg | Freq: Two times a day (BID) | INTRAMUSCULAR | Status: AC
  Administered 2013-12-13: 20 mg via INTRAVENOUS
  Filled 2013-12-13: qty 2

## 2013-12-13 NOTE — Progress Notes (Signed)
Patient Demographics  Samuel Schultz, is a 38 y.o. male, DOB - 08-11-1975, SMO:[id number]  Admit date - 12/10/2013   Admitting Physician Domenic Polite, MD  Outpatient Primary MD for the patient is [name], MD  LOS - 3   No chief complaint on file.       HPI:  38 y.o. male is here because of history of diffuse large B cell lymphoma, stage IV disease and was treated with R. 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 He was continued on Cubicin and fluconazole through 6/24, 14 days after his last negative culture.   During the same admission he also complained of right-sided lower back pain and leg pain, MRI was found to be negative for discitis . He was started on Flexeril and MS Contin for back pain. He has been progressively getting weaker, has had chills,rigors,, no documented fever at the nursing home. The patient is unable to ambulate because of weakness and pain in his back.   He had an MRI done at [facility] ED that showed new/progressive L3-L4  discitis/osteomyelitis but no abscess. He also had WBC count of 6.8, a sodium of 121.    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        Subjective:   Samuel Schultz today has, No headache, No chest pain, No abdominal pain - No Nausea, No new weakness tingling or numbness, No Cough - SOB. ++ Low back pain.      Assessment & Plan     1. Back Pain - per MRI done at [facility] ER on 12/10/2013 patient has discitis/os to mellitus without the evidence of frank abscess at L3-L4. Case  was discussed with ID who are on board, currently planned to hold antibiotics, post CT-guided needle aspiration on 12/11/2013 by IR for targeted culture and sensitivity. ID following. Continue supportive care for now.    2. AIDS - is to be followed by ID in the outpatient setting had a pending appointment. Continue outpatient medications for now. ID to follow.   On azithromycin for Mycobacterium avium prophylaxis, currently not on Bactrim. ID following.     3. History of advanced Large cell lymphoma with pancytopenia (Cirrhosis) - being followed by Dr. Alvy Bimler and [place] in the outpatient setting.      4. 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     5. Depression on Lexapro.     6. Hyponatremia. Serum osmolality is lower than urine osmolality, urine sodium greater then 50. 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 Continue free water restriction and IV Lasix. Monitor BMP.     7. Mild hyperkalemia. Hold Aldactone today, IV Lasix, Kayexalate oral, repeat BMP in the morning.      Code Status: full  Family Communication:  none present  Disposition Plan: TBD   Procedures   MRI [place] L spine - diskitis   Consults  IR, ID   Medications  Scheduled Meds: . atovaquone  1,500 mg Oral Daily  . [START ON 12/15/2013] azithromycin  1,200 mg Oral Q Fri  . cyclobenzaprine  5 mg Oral QHS  . Darunavir Ethanolate  800 mg Oral Q breakfast  . docusate sodium  100 mg Oral BID  . emtricitabine-tenofovir  1 tablet Oral QHS  . feeding supplement (RESOURCE BREEZE)  1 Container Oral BID PC  . furosemide  20 mg Intravenous BID  . gabapentin  300 mg Oral TID  . lactose free nutrition  237 mL Oral BID  . morphine  15 mg Oral BID  . pantoprazole  40 mg Oral BID  . phytonadione  10 mg Oral Daily  . ritonavir  100 mg Oral Q breakfast  . sodium chloride  3 mL Intravenous Q12H  . sucralfate  1 g Oral TID WC   Continuous Infusions:   PRN Meds:.acetaminophen, acetaminophen, HYDROmorphone (DILAUDID) injection, ondansetron (ZOFRAN) IV, ondansetron  DVT Prophylaxis  SCDs    Lab Results  Component Value Date   PLT 72* 12/13/2013    Antibiotics    Anti-infectives   Start     Dose/Rate Route Frequency Ordered Stop   12/15/13 1000  azithromycin (ZITHROMAX) tablet 1,200 mg     1,200 mg Oral Every Fri 12/10/13 2050     12/11/13 1000  atovaquone (MEPRON) 750 MG/5ML suspension 1,500 mg     1,500 mg Oral Daily 12/10/13 2050     12/11/13 0800  Darunavir Ethanolate (PREZISTA) tablet 800 mg     800 mg Oral Daily with breakfast 12/10/13 2050     12/11/13 0800  ritonavir (NORVIR) capsule 100 mg     100 mg Oral Daily with breakfast 12/10/13 2050     12/10/13 2200  emtricitabine-tenofovir (TRUVADA) 200-300 MG per tablet 1 tablet     1 tablet Oral Daily at bedtime 12/10/13 2050            Objective:   Filed Vitals:   12/12/13 0605 12/12/13 1300 12/12/13 2157 12/13/13 0506  BP: 120/58 113/56 109/56 95/39  Pulse: 93 89 98 89  Temp:  97.7 F (36.5 C) 98.1 F (36.7 C) 98.1 F (36.7 C) 97.6 F (36.4 C)  TempSrc: Oral  Oral Oral Oral  Resp: 20 18  15   Height:      Weight:      SpO2: 99% 96% 99% 95%    Wt Readings from Last 3 Encounters:  12/10/13 88.451 kg (195 lb)  12/05/13 87.68 kg (193 lb 4.8 oz)  11/23/13 99.111 kg (218 lb 8 oz)     Intake/Output Summary (Last 24 hours) at 12/13/13 1021 Last data filed at 12/13/13 1000  Gross per 24 hour  Intake    458 ml  Output   2200 ml  Net  -1742 ml     Physical Exam  Awake Alert, Oriented X 3, No new F.N deficits, dramatic affect .AT,PERRAL Supple Neck,No JVD, No cervical lymphadenopathy appriciated.  Symmetrical Chest wall movement, Good air movement bilaterally, CTAB RRR,No Gallops,Rubs or new Murmurs, No Parasternal Heave +ve B.Sounds, Abd Soft, No tenderness, No organomegaly appriciated, No rebound - guarding or rigidity. No Cyanosis, Clubbing or edema, No new Rash or bruise      Data Review   Micro Results Recent Results (from the past 240 hour(s))  AFB CULTURE WITH SMEAR     Status: None   Collection Time    12/11/13 12:44 PM      Result Value Ref Range Status   Specimen Description WOUND   Final   Special Requests LUMBAR 4 AND 5 DISC   Final   Acid Fast Smear     Final   Value: NO ACID FAST BACILLI SEEN     Performed at [facility]   Culture     Final   Value: CULTURE WILL BE EXAMINED FOR 6 WEEKS BEFORE ISSUING A FINAL REPORT     Performed at [facility]   Report Status PENDING   Incomplete  FUNGUS CULTURE W SMEAR     Status: None   Collection Time    12/11/13 12:44 PM      Result Value Ref Range Status   Specimen Description WOUND   Final   Special Requests LUMBAR 4 AND 5 DISC   Final   Fungal Smear     Final   Value: NO YEAST OR FUNGAL ELEMENTS SEEN     Performed at [facility]   Culture     Final   Value: CULTURE IN PROGRESS FOR FOUR WEEKS     Performed at [facility]   Report Status PENDING   Incomplete  URINE CULTURE     Status: None   Collection Time    12/11/13  5:14 PM        Result Value Ref Range Status   Specimen Description URINE, CLEAN CATCH   Final   Special Requests NONE   Final   Culture  Setup Time     Final   Value: 12/11/2013 18:43     Performed at [place]     Final   Value: NO GROWTH     Performed at [facility]   Culture     Final   Value: NO GROWTH     Performed at [facility]   Report Status 12/12/2013 FINAL   Final  CULTURE, BLOOD (ROUTINE X 2)     Status: None   Collection Time    12/11/13  7:20 PM      Result Value Ref Range Status  Specimen Description BLOOD RIGHT HAND   Final   Special Requests BOTTLES DRAWN AEROBIC ONLY [facility]   Final   Culture  Setup Time     Final   Value: 12/12/2013 00:23     Performed at [facility]   Culture     Final   Value:        BLOOD CULTURE RECEIVED NO GROWTH TO DATE CULTURE WILL BE HELD FOR 5 DAYS BEFORE ISSUING A FINAL NEGATIVE REPORT     Performed at [facility]   Report Status PENDING   Incomplete  CULTURE, BLOOD (ROUTINE X 2)     Status: None   Collection Time    12/11/13  7:30 PM      Result Value Ref Range Status   Specimen Description BLOOD LEFT HAND   Final   Special Requests BOTTLES DRAWN AEROBIC ONLY 3.5CC   Final   Culture  Setup Time     Final   Value: 12/12/2013 00:23     Performed at [facility]   Culture     Final   Value:        BLOOD CULTURE RECEIVED NO GROWTH TO DATE CULTURE WILL BE HELD FOR 5 DAYS BEFORE ISSUING A FINAL NEGATIVE REPORT     Performed at [facility]   Report Status PENDING   Incomplete    Radiology Reports Dg Abd 1 View  11/12/2013   CLINICAL DATA:  Colitis.  EXAM: ABDOMEN - 1 VIEW  COMPARISON:  CT of the abdomen and pelvis 11/10/2013.  FINDINGS: Oral contrast material is noted throughout the colon and distal rectum. No pathologic distention of small bowel. No pneumoperitoneum. Multiple surgical clips project over the right upper quadrant of the abdomen. Splenic contour appears  enlarged.  IMPRESSION: 1. Nonobstructive bowel gas pattern. 2. No pneumoperitoneum. 3. Splenomegaly.   Electronically Signed   By: Vinnie Langton M.D.   On: 11/12/2013 11:47   Mr Lumbar Spine W Wo Contrast  11/15/2013   CLINICAL DATA:  38 year old male with bacteremia and new back pain. Severe low back pain radiating to the right lower extremity. Initial encounter. HIV, cirrhosis, lymphoma.  EXAM: MRI LUMBAR SPINE WITHOUT AND WITH CONTRAST  TECHNIQUE: Multiplanar and multiecho pulse sequences of the lumbar spine were obtained without and with intravenous contrast.  CONTRAST:  35mL MULTIHANCE GADOBENATE DIMEGLUMINE 529 MG/ML IV SOLN  COMPARISON:  CT Abdomen and Pelvis 11/10/2013 and earlier.  FINDINGS: Normal lumbar segmentation depicted on comparison.  Diffuse loss of normal bone marrow signal in the visible spine, and left iliac bone. However, no marrow edema or destructive osseous lesion identified. No abnormal vertebral enhancement in the spine.  Visualized lower thoracic spinal cord is normal with conus medularis at L1. Normal cauda equina nerve roots. No abnormal intradural enhancement.  There is mild abnormal STIR and T2 signal in the lower right erector spinae muscles, at the L4 and L5 level (series 5, image 1 and series 6, image 26). This is nonspecific. No definite abnormal enhancement, and other visible paraspinal muscles appear normal. There is mild subcutaneous edema in the lumbar spine, significance doubtful in this setting.  Much of the abdominal viscera are obscured on these images.  T11-T12: Grossly normal.  T12-L1:  Negative.  L1-L2:  Negative.  L2-L3:  Negative.  L3-L4: Mild disc space loss and disc desiccation. Mild circumferential disc osteophyte complex. Mild facet hypertrophy. No significant stenosis.  L4-L5:  Negative except for mild facet hypertrophy.  L5-S1: Disc  space loss with disc desiccation and right eccentric circumferential disc osteophyte complex. Focal central component (series  6, image 31), but no spinal or definite lateral recess stenosis. No foraminal stenosis.  IMPRESSION: 1. Diffuse loss of normal bone marrow signal, but may be post treatment affect rather than due to widespread osseous metastatic disease. No destructive osseous lesion identified. 2. No evidence of lumbar discitis osteomyelitis. Mild to moderate chronic appearing disc degeneration at L3-L4 and L5-S1. No spinal stenosis. 3. Mild signal abnormality in the right lower lumbar erector spinae musculature, nonspecific. Early infectious myositis is not excluded in this setting.   Electronically Signed   By: Lars Pinks M.D.   On: 11/15/2013 20:30   Ir Removal [employer] Access W/ Port W/o Fl Mod Sed  11/16/2013   CLINICAL DATA:  History of bacteremia, concern for seeding of patient's chronic Port a Catheter.  EXAM: REMOVAL OF IMPLANTED TUNNELED PORT-A-CATH  MEDICATIONS: The patient is currently admitted to the hospital and receiving intravenous antibiotics; The antibiotic was administered within 1 hour prior to the start of the procedure.  ANESTHESIA/SEDATION: Intravenous Versed, Fentanyl and Dilaudid was administered for conscious sedation  Sedation time: 20  FLUOROSCOPY TIME:  None  PROCEDURE: Informed written consent was obtained from the patient after a discussion of the risk, benefits and alternatives to the procedure. The patient was positioned supine on the fluoroscopy table and the right chest Port-A-Cath site was prepped with chlorhexidine. A sterile gown and gloves were worn during the procedure. Local anesthesia was provided with 1% lidocaine with epinephrine. A timeout was performed prior to the initiation of the procedure.  An incision was made overlying the Port-A-Cath with a #15 scalpel. Utilizing sharp and blunt dissection, the Port-A-Cath was removed completely. The pocked was irrigated with sterile saline. Wound closure was performed with subcutaneous 3-0 Monocryl, subcuticular 4-0 Vicryl and Dermabond. A dressing  was placed. The patient tolerated the procedure well without immediate post procedural complication.  FINDINGS: Successful removal of implant Port-A-Cath without immediate post procedural complication.  Visual inspection of the Port a catheter pocket was negative for any definitive evidence of infection and as such, the incision was closed primarily with absorbable suture as above.  IMPRESSION: Successful removal of implanted Port-A-Cath.   Electronically Signed   By: Sandi Mariscal M.D.   On: 11/16/2013 16:57    CBC  Recent Labs Lab 12/11/13 0500 12/12/13 0800 12/13/13 0050  WBC 4.3 4.4 3.1*  HGB 11.3* 10.8* 9.9*  HCT 31.6* 30.2* 27.7*  PLT 119* 109* 72*  MCV 101.9* 104.1* 104.5*  MCH 36.5* 37.2* 37.4*  MCHC 35.8 35.8 35.7  RDW 15.7* 15.9* 15.9*    Chemistries   Recent Labs Lab 12/10/13 2050 12/11/13 0500 12/12/13 0800 12/13/13 0050  NA 122* 124* 125* 125*  K 4.5 4.5 5.4* 5.0  CL 88* 92* 93* 92*  CO2 22 23 23 24   GLUCOSE 103* 86 93 82  BUN 24* 21 17 17   CREATININE 0.86 0.90 0.89 0.83  CALCIUM 9.5 9.1 9.0 8.7  MG  --   --  1.8 1.5  AST 82* 79*  --   --   ALT 67* 69*  --   --   ALKPHOS 123* 117  --   --   BILITOT 4.7* 4.3*  --   --    ------------------------------------------------------------------------------------------------------------------ estimated creatinine clearance is 132.8 ml/min (by C-G formula based on Cr of 0.83). ------------------------------------------------------------------------------------------------------------------ No results found for this basename: HGBA1C,  in the last 72 hours ------------------------------------------------------------------------------------------------------------------  No results found for this basename: CHOL, HDL, LDLCALC, TRIG, CHOLHDL, LDLDIRECT,  in the last 72 hours ------------------------------------------------------------------------------------------------------------------  Recent Labs  12/10/13 2050  TSH  2.520   ------------------------------------------------------------------------------------------------------------------ No results found for this basename: VITAMINB12, FOLATE, FERRITIN, TIBC, IRON, RETICCTPCT,  in the last 72 hours  Coagulation profile  Recent Labs Lab 12/10/13 2050 12/11/13 1035  INR 1.49 1.53*    No results found for this basename: DDIMER,  in the last 72 hours  Cardiac Enzymes No results found for this basename: CK, CKMB, TROPONINI, MYOGLOBIN,  in the last 168 hours ------------------------------------------------------------------------------------------------------------------ No components found with this basename: POCBNP,      Time Spent in minutes   35   Clorissa Gruenberg K M.D on 12/13/2013 at 10:21 AM  Between 7am to 7pm - Pager - [phone]  After 7pm go to [email or web address] - password [email or web address]  And look for the night coverage person covering for me after hours  [facility] Office  [phone]   **Disclaimer: This note may have been dictated with voice recognition software. Similar sounding words can inadvertently be transcribed and this note may contain transcription errors which may not have been corrected upon publication of note.**

## 2013-12-13 NOTE — Progress Notes (Signed)
Physical Therapy Treatment Patient Details Name: Winson Eichorn MRN: [id number] DOB: [birth year] Today's Date: 12/13/2013    History of Present Illness Pt admitted 7/5 with progressive back pain from [place]. Pt with history of diffuse large B cell lymphoma, stage IV disease.Pt now with L foot pain with report "the doctors think the infection spead to my foot."    PT Comments    Pt mobility further limited this date by onset of L foot pain 7/7. Pt functioning at a minA level and con't to be on O2. Acute PT to con't to follow to progress activity tolerance.  Follow Up Recommendations  SNF     Equipment Recommendations       Recommendations for Other Services       Precautions / Restrictions Precautions Precautions: Fall Restrictions Weight Bearing Restrictions: No    Mobility  Bed Mobility Overal bed mobility: Needs Assistance Bed Mobility: Rolling;Sidelying to Sit Rolling: Modified independent (Device/Increase time) Sidelying to sit: Min assist       General bed mobility comments: assist for trunk elevation due to pain, increasd time.  Transfers Overall transfer level: Needs assistance Equipment used: Rolling walker (2 wheeled) Transfers: Sit to/from Stand Sit to Stand: Min assist         General transfer comment: increased time, assist to transition to walker due to pain  Ambulation/Gait Ambulation/Gait assistance: Min guard Ambulation Distance (Feet): 50 Feet (x2) Assistive device: Rolling walker (2 wheeled) Gait Pattern/deviations: Step-to pattern;Antalgic Gait velocity: very slow Gait velocity interpretation: <1.8 ft/sec, indicative of risk for recurrent falls General Gait Details: pt with significant increased bilat UE WBing, limited L LE wbing tolerance. seated rest break x 3 minutes   Stairs            Wheelchair Mobility    Modified Rankin (Stroke Patients Only)       Balance                                     Cognition Arousal/Alertness: Awake/alert Behavior During Therapy: WFL for tasks assessed/performed Overall Cognitive Status: Within Functional Limits for tasks assessed                      Exercises      General Comments        Pertinent Vitals/Pain 8/10 back pain and 9/10 L foot pain    Home Living Family/patient expects to be discharged to:: Skilled nursing facility                    Prior Function            PT Goals (current goals can now be found in the care plan section) Progress towards PT goals: Progressing toward goals    Frequency  Min 3X/week    PT Plan Current plan remains appropriate    Co-evaluation             End of Session Equipment Utilized During Treatment: Gait belt Activity Tolerance: Patient limited by pain Patient left: in chair;with call bell/phone within reach     Time: 1513-1539 PT Time Calculation (min): 26 min  Charges:  $Gait Training: 23-37 mins                    G Codes:      Kingsley Callander 12/13/2013, 4:25 PM  Kittie Plater, PT, DPT Pager #:  [phone] Office #: [phone]

## 2013-12-13 NOTE — Clinical Documentation Improvement (Signed)
Conflicting documentation found in chart regarding the acuity of malnutrition   Nutritional Consult notes Moderate Malnutrition with mild to moderate muscle wasting  Severe Malnutrition noted by Medicine  Please clarify the degree/acuity of Malnutrition the patient has: Severe, Moderate, Mild   Thank You, Zoila Shutter ,RN Clinical Documentation Specialist:  [place] [facility]

## 2013-12-13 NOTE — Clinical Social Work Psychosocial (Signed)
Clinical Social Work Department BRIEF PSYCHOSOCIAL ASSESSMENT 12/13/2013  Patient:  Samuel Schultz, Samuel Schultz     Account Number:  [id number]     Admit date:  12/10/2013  Clinical Social Worker:  Delrae Sawyers  Date/Time:  12/13/2013 11:31 AM  Referred by:  Physician  Date Referred:  12/13/2013 Referred for  SNF Placement   Other Referral:   none.   Interview type:  Patient Other interview type:   none.    PSYCHOSOCIAL DATA Living Status:  FACILITY Admitted from facility:   Level of care:  Family Care Home Primary support name:  Stanton Kidney and Mel Almond Primary support relationship to patient:  FRIEND Degree of support available:   Adequate support system. Pt first denied any support system from family or friends. Pt then stated he "calls Stanton Kidney and Mel Almond if I need anything." Pt reports "Stanton Kidney and [place]" live in [place], [place].    CURRENT CONCERNS Current Concerns  Post-Acute Placement   Other Concerns:   none.    SOCIAL WORK ASSESSMENT / PLAN CSW received consult regarding possible SNF placement at time of discharge. CSW met with pt at bedside to discuss discharge disposition. Pt stated he is currently living at [place], but is agreeable to short-term rehabilitation at [facility]. Pt informed CSW he has previously received rehabilitation at [facility].    Pt was first unable to report any support systems, but then stated two friends [Mary and Bailey] who both live in [place], [place]. CSW to continue to follow and assist with discharge planning needs.   Assessment/plan status:  Psychosocial Support/Ongoing Assessment of Needs Other assessment/ plan:   none.   Information/referral to community resources:   SNF bed offers in [place], [place], [place], [place], and [place] counties.    PATIENT'S/FAMILY'S RESPONSE TO PLAN OF CARE: Pt understanding and agreeable to CSW plan of care. Pt stated he was tired and not feeling well during assessment. Pt kept his eyes close during CSW  assessment, but did respond to CSW questions. Pt expressed no further questions or concerns at this time regarding his discharge disposition.       Lubertha Sayres, MSW, [facility] Licensed Clinical Social Worker [phone] and [phone]

## 2013-12-13 NOTE — Progress Notes (Signed)
[facility]    Subjective: Pain worse  Antibiotics:  Anti-infectives   Start     Dose/Rate Route Frequency Ordered Stop   12/15/13 1000  azithromycin (ZITHROMAX) tablet 1,200 mg     1,200 mg Oral Every Fri 12/10/13 2050     12/11/13 1000  atovaquone (MEPRON) 750 MG/5ML suspension 1,500 mg     1,500 mg Oral Daily 12/10/13 2050     12/11/13 0800  Darunavir Ethanolate (PREZISTA) tablet 800 mg     800 mg Oral Daily with breakfast 12/10/13 2050     12/11/13 0800  ritonavir (NORVIR) capsule 100 mg     100 mg Oral Daily with breakfast 12/10/13 2050     12/10/13 2200  emtricitabine-tenofovir (TRUVADA) 200-300 MG per tablet 1 tablet     1 tablet Oral Daily at bedtime 12/10/13 2050        Medications: Scheduled Meds: . atovaquone  1,500 mg Oral Daily  . [START ON 12/15/2013] azithromycin  1,200 mg Oral Q Fri  . cyclobenzaprine  5 mg Oral QHS  . Darunavir Ethanolate  800 mg Oral Q breakfast  . docusate sodium  100 mg Oral BID  . emtricitabine-tenofovir  1 tablet Oral QHS  . feeding supplement (RESOURCE BREEZE)  1 Container Oral BID PC  . furosemide  20 mg Intravenous BID  . gabapentin  300 mg Oral TID  . lactose free nutrition  237 mL Oral BID  . morphine  15 mg Oral BID  . pantoprazole  40 mg Oral BID  . phytonadione  10 mg Oral Daily  . ritonavir  100 mg Oral Q breakfast  . sodium chloride  3 mL Intravenous Q12H  . sucralfate  1 g Oral TID WC   Continuous Infusions:  PRN Meds:.acetaminophen, acetaminophen, HYDROmorphone (DILAUDID) injection, ondansetron (ZOFRAN) IV, ondansetron    Objective: Weight change:   Intake/Output Summary (Last 24 hours) at 12/13/13 1531 Last data filed at 12/13/13 1000  Gross per 24 hour  Intake    458 ml  Output   1600 ml  Net  -1142 ml   Blood pressure 95/39, pulse 89, temperature 97.6 F (36.4 C), temperature source Oral, resp. rate 15, height 5\' 9"  (1.753 m), weight 195 lb (88.451 kg), SpO2 95.00%. Temp:   [97.6 F (36.4 C)-98.1 F (36.7 C)] 97.6 F (36.4 C) (07/08 0506) Pulse Rate:  [89-98] 89 (07/08 0506) Resp:  [15] 15 (07/08 0506) BP: (95-109)/(39-56) 95/39 mmHg (07/08 0506) SpO2:  [95 %-99 %] 95 % (07/08 0506)  Physical Exam: General: Alert and awake, oriented x3, pale coarse voice and clearly in pain  HEENT: anicteric sclera, pupils reactive to light and accommodation, EOMI, oropharynx clear and without exudate  CVS regular rate, normal r, no murmur rubs or gallops  Chest: clear to auscultation bilaterally, no wheezing, rales or rhonchi  Abdomen: soft nontender, nondistended, normal bowel sounds,  Extremities: no clubbing or edema noted bilaterally  Skin: no rashes  Neuro: No focal deficits but has severe pain with trying to raise his right leg in particular  CBC:  Recent Labs Lab 12/10/13 2050 12/11/13 0500 12/11/13 1035 12/12/13 0800 12/13/13 0050  HGB  --  11.3*  --  10.8* 9.9*  HCT  --  31.6*  --  30.2* 27.7*  PLT  --  119*  --  109* 72*  INR 1.49  --  1.53*  --   --      BMET  Recent Labs  12/12/13 0800 12/13/13 0050  NA 125* 125*  K 5.4* 5.0  CL 93* 92*  CO2 23 24  GLUCOSE 93 82  BUN 17 17  CREATININE 0.89 0.83  CALCIUM 9.0 8.7     Liver Panel   Recent Labs  12/10/13 2050 12/11/13 0500  PROT 8.3 8.0  ALBUMIN 2.8* 2.7*  AST 82* 79*  ALT 67* 69*  ALKPHOS 123* 117  BILITOT 4.7* 4.3*       Sedimentation Rate No results found for this basename: ESRSEDRATE,  in the last 72 hours C-Reactive Protein No results found for this basename: CRP,  in the last 72 hours  Micro Results: Recent Results (from the past 720 hour(s))  CULTURE, BLOOD (ROUTINE X 2)     Status: None   Collection Time    11/17/13  1:00 PM      Result Value Ref Range Status   Specimen Description BLOOD LEFT ANTECUBITAL   Final   Special Requests BOTTLES DRAWN AEROBIC AND ANAEROBIC 10CC   Final   Culture  Setup Time     Final   Value: 11/17/2013 16:48     Performed at  [facility]   Culture     Final   Value: NO GROWTH 5 DAYS     Performed at [facility]   Report Status 11/23/2013 FINAL   Final  CULTURE, BLOOD (ROUTINE X 2)     Status: None   Collection Time    11/17/13  1:10 PM      Result Value Ref Range Status   Specimen Description BLOOD RIGHT HAND   Final   Special Requests BOTTLES DRAWN AEROBIC ONLY 2CC   Final   Culture  Setup Time     Final   Value: 11/17/2013 16:48     Performed at [facility]   Culture     Final   Value: NO GROWTH 5 DAYS     Performed at [facility]   Report Status 11/23/2013 FINAL   Final  AFB CULTURE WITH SMEAR     Status: None   Collection Time    12/11/13 12:44 PM      Result Value Ref Range Status   Specimen Description WOUND   Final   Special Requests LUMBAR 4 AND 5 DISC   Final   Acid Fast Smear     Final   Value: NO ACID FAST BACILLI SEEN     Performed at [facility]   Culture     Final   Value: CULTURE WILL BE EXAMINED FOR 6 WEEKS BEFORE ISSUING A FINAL REPORT     Performed at [facility]   Report Status PENDING   Incomplete  FUNGUS CULTURE W SMEAR     Status: None   Collection Time    12/11/13 12:44 PM      Result Value Ref Range Status   Specimen Description WOUND   Final   Special Requests LUMBAR 4 AND 5 DISC   Final   Fungal Smear     Final   Value: NO YEAST OR FUNGAL ELEMENTS SEEN     Performed at [facility]   Culture     Final   Value: CULTURE IN PROGRESS FOR FOUR WEEKS     Performed at [facility]   Report Status PENDING   Incomplete  CULTURE, ROUTINE-ABSCESS     Status: None   Collection Time    12/11/13 12:44 PM  Result Value Ref Range Status   Specimen Description ABSCESS BACK   Final   Special Requests L4 L5 DISC ASPIRATE   Final   Gram Stain PENDING   Incomplete   Culture     Final   Value: NO GROWTH 1 DAY     Performed at [facility]   Report Status PENDING   Incomplete  URINE CULTURE      Status: None   Collection Time    12/11/13  5:14 PM      Result Value Ref Range Status   Specimen Description URINE, CLEAN CATCH   Final   Special Requests NONE   Final   Culture  Setup Time     Final   Value: 12/11/2013 18:43     Performed at [place]     Final   Value: NO GROWTH     Performed at [facility]   Culture     Final   Value: NO GROWTH     Performed at [facility]   Report Status 12/12/2013 FINAL   Final  CULTURE, BLOOD (ROUTINE X 2)     Status: None   Collection Time    12/11/13  7:20 PM      Result Value Ref Range Status   Specimen Description BLOOD RIGHT HAND   Final   Special Requests BOTTLES DRAWN AEROBIC ONLY 6CC   Final   Culture  Setup Time     Final   Value: 12/12/2013 00:23     Performed at [facility]   Culture     Final   Value:        BLOOD CULTURE RECEIVED NO GROWTH TO DATE CULTURE WILL BE HELD FOR 5 DAYS BEFORE ISSUING A FINAL NEGATIVE REPORT     Performed at [facility]   Report Status PENDING   Incomplete  CULTURE, BLOOD (ROUTINE X 2)     Status: None   Collection Time    12/11/13  7:30 PM      Result Value Ref Range Status   Specimen Description BLOOD LEFT HAND   Final   Special Requests BOTTLES DRAWN AEROBIC ONLY 3.5CC   Final   Culture  Setup Time     Final   Value: 12/12/2013 00:23     Performed at [facility]   Culture     Final   Value:        BLOOD CULTURE RECEIVED NO GROWTH TO DATE CULTURE WILL BE HELD FOR 5 DAYS BEFORE ISSUING A FINAL NEGATIVE REPORT     Performed at [facility]   Report Status PENDING   Incomplete    Studies/Results: No results found.    Assessment/Plan:  Active Problems:   AIDS   Large cell lymphoma   Cirrhosis, non-alcoholic   Protein-calorie malnutrition, severe   Discitis   Malnutrition of moderate degree    Samuel Schultz is a 38 y.o. 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 He got a dose of vancomycin and another antibiotic he thinks  at [facility] unfortunately prior to transfer here. He's undergone I are guided biopsy yesterday. I called the microbiology lab and they had processed AFB and fungal cultures but had not even set up a tissue culture until yesterday  The culture that is being intubated in the micro-lab was started and is negative at 24 hours mother is working on getting this made official so it should crossover into Epic. I doubt one to grow any organisms I think examination need repeat aspirate   #1 osteomyelitis with vertebral infection and discitis:   --Continue to hold antibiotics and followup cultures , see above discussion  --I FEEL STRONGLY THAT HE SHOULD HAVE A REPEAT ASPIRATE OF DISK SPACE ON Friday IF BACTERIAL CULTURES STILL NEGATIVE WITH ASPIRATE SENT FOR BACTERIAL CULTURES ONLY --ONCE WE HAVE THOSE CULTURES WE CAN START HIS ANTIBIOTICS UP, LIKELY BROAD ONES SUCH AS DAPTOMYCIN AT 8MG /KG + IMIPENEM if nothing grows   --I am most suspicious for AMP sensitive ENterococcus and think that the VRE and Candida Could have been contaminants that we saw before (though his family do not want to risk of not treating them.)   #2 HIV/AIDS: continue as Prezista Norvir Truvada, will resume prophylactic antibiotics after we have nailed down what is going on in his vertebral osteomyelitis.         LOS: 3 days   Alcide Evener 12/13/2013, 3:31 PM

## 2013-12-13 NOTE — Clinical Social Work Placement (Signed)
Clinical Social Work Department CLINICAL SOCIAL WORK PLACEMENT NOTE 12/13/2013  Patient:  Samuel Schultz, Samuel Schultz  Account Number:  [id number] Admit date:  12/10/2013  Clinical Social Worker:  Delrae Sawyers  Date/time:  12/13/2013 11:56 AM  Clinical Social Work is seeking post-discharge placement for this patient at the following level of care:   [facility]   (*CSW will update this form in Epic as items are completed)   12/13/2013  Patient/family provided with [facility] Department of Clinical Social Work's list of facilities offering this level of care within the geographic area requested by the patient (or if unable, by the patient's family).  12/13/2013  Patient/family informed of their freedom to choose among providers that offer the needed level of care, that participate in Medicare, Medicaid or managed care program needed by the patient, have an available bed and are willing to accept the patient.  12/13/2013  Patient/family informed of [facility]' ownership interest in [facility], as well as of the fact that they are under no obligation to receive care at this facility.  PASARR submitted to [facility]DS on 12/13/2013 PASARR number received on   FL2 transmitted to all facilities in geographic area requested by pt/family on  12/13/2013 FL2 transmitted to all facilities within larger geographic area on 12/13/2013  Patient informed that his/her managed care company has contracts with or will negotiate with  certain facilities, including the following:     Patient/family informed of bed offers received:   Patient chooses bed at  Physician recommends and patient chooses bed at    Patient to be transferred to  on   Patient to be transferred to facility by  Patient and family notified of transfer on  Name of family member notified:    The following physician request were entered in Epic:   Additional Comments:  Lubertha Sayres, MSW, [facility] Licensed Clinical Social  Worker [phone] and [phone]

## 2013-12-14 ENCOUNTER — Inpatient Hospital Stay (HOSPITAL_COMMUNITY): Payer: Medicaid Other

## 2013-12-14 DIAGNOSIS — M79609 Pain in unspecified limb: Secondary | ICD-10-CM

## 2013-12-14 LAB — COMPREHENSIVE METABOLIC PANEL
ALT: 53 U/L (ref 0–53)
AST: 53 U/L — ABNORMAL HIGH (ref 0–37)
Albumin: 2.4 g/dL — ABNORMAL LOW (ref 3.5–5.2)
Alkaline Phosphatase: 115 U/L (ref 39–117)
Anion gap: 10 (ref 5–15)
BUN: 13 mg/dL (ref 6–23)
CO2: 23 meq/L (ref 19–32)
Calcium: 8.7 mg/dL (ref 8.4–10.5)
Chloride: 96 mEq/L (ref 96–112)
Creatinine, Ser: 0.7 mg/dL (ref 0.50–1.35)
GFR calc Af Amer: >90 mL/min (ref >90)
GFR calc non Af Amer: >90 mL/min (ref >90)
Glucose, Bld: 77 mg/dL (ref 70–99)
POTASSIUM: 5 meq/L (ref 3.7–5.3)
SODIUM: 129 meq/L — AB (ref 137–147)
TOTAL PROTEIN: 7.3 g/dL (ref 6.0–8.3)
Total Bilirubin: 2.4 mg/dL — ABNORMAL HIGH (ref 0.3–1.2)

## 2013-12-14 LAB — CBC
HEMATOCRIT: 26.8 % — AB (ref 39.0–52.0)
HEMOGLOBIN: 9.5 g/dL — AB (ref 13.0–17.0)
MCH: 36.5 pg — ABNORMAL HIGH (ref 26.0–34.0)
MCHC: 35.4 g/dL (ref 30.0–36.0)
MCV: 103.1 fL — ABNORMAL HIGH (ref 78.0–100.0)
Platelets: 84 10*3/uL — ABNORMAL LOW (ref 150–400)
RBC: 2.6 MIL/uL — ABNORMAL LOW (ref 4.22–5.81)
RDW: 15.5 % (ref 11.5–15.5)
WBC: 3.3 10*3/uL — AB (ref 4.0–10.5)

## 2013-12-14 LAB — MAGNESIUM: Magnesium: 1.8 mg/dL (ref 1.5–2.5)

## 2013-12-14 MED ORDER — COLCHICINE 0.6 MG PO TABS
0.6000 mg | ORAL_TABLET | Freq: Every day | ORAL | Status: DC
Start: 2013-12-14 — End: 2013-12-17
  Administered 2013-12-14 – 2013-12-17 (×4): 0.6 mg via ORAL
  Filled 2013-12-14 (×5): qty 1

## 2013-12-14 MED ORDER — PREDNISONE 20 MG PO TABS
20.0000 mg | ORAL_TABLET | Freq: Every day | ORAL | Status: DC
  Administered 2013-12-14: 20 mg via ORAL
  Filled 2013-12-14: qty 1

## 2013-12-14 MED ORDER — KETOROLAC TROMETHAMINE 30 MG/ML IJ SOLN
30.0000 mg | Freq: Once | INTRAMUSCULAR | Status: AC
  Administered 2013-12-14: 30 mg via INTRAVENOUS
  Filled 2013-12-14: qty 1

## 2013-12-14 MED ORDER — HYDROMORPHONE HCL PF 1 MG/ML IJ SOLN
1.0000 mg | INTRAMUSCULAR | Status: DC | PRN
  Administered 2013-12-14 – 2013-12-17 (×13): 1 mg via INTRAVENOUS
  Filled 2013-12-14 (×14): qty 1

## 2013-12-14 MED ORDER — GADOBENATE DIMEGLUMINE 529 MG/ML IV SOLN
18.0000 mL | Freq: Once | INTRAVENOUS | Status: AC | PRN
  Administered 2013-12-14: 18 mL via INTRAVENOUS

## 2013-12-14 MED ORDER — FUROSEMIDE 40 MG PO TABS
40.0000 mg | ORAL_TABLET | Freq: Two times a day (BID) | ORAL | Status: DC
  Administered 2013-12-14 – 2013-12-19 (×12): 40 mg via ORAL
  Filled 2013-12-14 (×13): qty 1

## 2013-12-14 NOTE — Progress Notes (Signed)
Patient Demographics  Samuel Schultz, is a 38 y.o. male, DOB - 07/31/1975, XHB:[id number]  Admit date - 12/10/2013   Admitting Physician Domenic Polite, MD  Outpatient Primary MD for the patient is [name], MD  LOS - 4   No chief complaint on file.       HPI:  38 y.o. male is here because of history of diffuse large B cell lymphoma, stage IV disease and was treated with R. 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 He was continued on Cubicin and fluconazole through 6/24, 14 days after his last negative culture.   During the same admission he also complained of right-sided lower back pain and leg pain, MRI was found to be negative for discitis . He was started on Flexeril and MS Contin for back pain. He has been progressively getting weaker, has had chills,rigors,, no documented fever at the nursing home. The patient is unable to ambulate because of weakness and pain in his back.   He had an MRI done at [facility] ED that showed new/progressive L3-L4  discitis/osteomyelitis but no abscess. He also had WBC count of 6.8, a sodium of 121.    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        Subjective:   [name] today has, No headache, No chest pain, No abdominal pain - No Nausea, No new weakness tingling or numbness, No Cough - SOB. + Low back pain. Mild left first toe pain.     Assessment & Plan     1. Back Pain - per MRI done at [facility] ER on 12/10/2013 patient has discitis/os to mellitus without the evidence of frank  abscess at L3-L4. Case was discussed with ID who are on board, currently planned to hold antibiotics, post CT-guided needle aspiration on 12/11/2013 by IR for targeted culture and sensitivity. ID following. Continue supportive care for now. Per ID likely repeat IR guided aspiration on 12/15/2013    2. AIDS - is to be followed by ID in the outpatient setting had a pending appointment. Continue outpatient medications for now. ID to follow.   On azithromycin for Mycobacterium avium prophylaxis, currently not on Bactrim. ID following.     3. History of advanced Large cell lymphoma with pancytopenia (Cirrhosis) - being followed by Dr. Alvy Bimler and [place] in the outpatient setting.      4. 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     5. Depression on Lexapro.     6. Hyponatremia. Serum osmolality is lower than urine osmolality, urine sodium greater then 50. 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 Urine with free water restriction and Lasix. Monitor BMP.     7. Mild hyperkalemia. Stop Aldactone, gentle Lasix. Post Kayexalate 1 dose.  Now upper limits of normal. Monitor closely.    8. Left great toe pain. Likely mild gout, seen by orthopedic physician Dr. Sharol Given, uric acid level stable. X-ray stable. Placed on prednisone and colchicine by orthopedics. Monitor clinically.      Code Status: full  Family Communication: none present  Disposition Plan: TBD   Procedures   MRI [place] L spine - diskitis   Consults  IR, ID, orthopedics Dr. Sharol Given   Medications  Scheduled Meds: . atovaquone  1,500 mg Oral Daily  . [START ON 12/15/2013] azithromycin  1,200 mg Oral Q Fri  . colchicine  0.6 mg Oral Daily  . cyclobenzaprine  5 mg Oral QHS  . Darunavir Ethanolate  800 mg Oral Q breakfast  . docusate sodium  100 mg Oral BID  . emtricitabine-tenofovir  1 tablet Oral QHS  . feeding supplement (RESOURCE BREEZE)  1 Container Oral BID PC  . furosemide  40 mg Oral BID  . gabapentin  300 mg Oral TID  . lactose free nutrition  237 mL Oral BID  . morphine  15 mg Oral BID  . pantoprazole  40 mg Oral BID  . phytonadione  10 mg Oral Daily  . predniSONE  20 mg Oral Q breakfast  . ritonavir  100 mg Oral Q breakfast  . sodium chloride  3 mL Intravenous Q12H  . sucralfate  1 g Oral TID WC   Continuous Infusions:   PRN Meds:.acetaminophen, acetaminophen, HYDROmorphone (DILAUDID) injection, ondansetron (ZOFRAN) IV, ondansetron  DVT Prophylaxis  SCDs    Lab Results  Component Value Date   PLT 84* 12/14/2013    Antibiotics    Anti-infectives   Start     Dose/Rate Route Frequency Ordered Stop   12/15/13 1000  azithromycin (ZITHROMAX) tablet 1,200 mg     1,200 mg Oral Every Fri 12/10/13 2050     12/11/13 1000  atovaquone (MEPRON) 750 MG/5ML suspension 1,500 mg     1,500 mg Oral Daily 12/10/13 2050     12/11/13 0800  Darunavir Ethanolate (PREZISTA) tablet 800 mg     800 mg Oral Daily with breakfast 12/10/13 2050     12/11/13 0800  ritonavir (NORVIR) capsule 100 mg     100 mg Oral Daily with breakfast 12/10/13  2050      12/10/13 2200  emtricitabine-tenofovir (TRUVADA) 200-300 MG per tablet 1 tablet     1 tablet Oral Daily at bedtime 12/10/13 2050            Objective:   Filed Vitals:   12/13/13 0506 12/13/13 1300 12/13/13 2149 12/14/13 0612  BP: 95/39 107/55 107/45 103/46  Pulse: 89 96 104 92  Temp: 97.6 F (36.4 C) 98.4 F (36.9 C) 97.8 F (36.6 C) 98.3 F (36.8 C)  TempSrc: Oral Oral Oral Oral  Resp: 15 16 20 20   Height:      Weight:      SpO2: 95% 96% 98% 98%    Wt Readings from Last 3 Encounters:  12/10/13 88.451 kg (195 lb)  12/05/13 87.68 kg (193 lb 4.8 oz)  11/23/13 99.111 kg (218 lb 8 oz)     Intake/Output Summary (Last 24 hours) at 12/14/13 0944 Last data filed at 12/14/13 5784  Gross per 24 hour  Intake   1200 ml  Output   2875 ml  Net  -1675 ml     Physical Exam  Awake Alert, Oriented X 3, No new F.N deficits, dramatic affect [place].AT,PERRAL Supple Neck,No JVD, No cervical lymphadenopathy appriciated.  Symmetrical Chest wall movement, Good air movement bilaterally, CTAB RRR,No Gallops,Rubs or new Murmurs, No Parasternal Heave +ve B.Sounds, Abd Soft, No tenderness, No organomegaly appriciated, No rebound - guarding or rigidity. No Cyanosis, Clubbing or edema, No new Rash or bruise      Data Review   Micro Results Recent Results (from the past 240 hour(s))  AFB CULTURE WITH SMEAR     Status: None   Collection Time    12/11/13 12:44 PM      Result Value Ref Range Status   Specimen Description WOUND   Final   Special Requests LUMBAR 4 AND 5 DISC   Final   Acid Fast Smear     Final   Value: NO ACID FAST BACILLI SEEN     Performed at [facility]   Culture     Final   Value: CULTURE WILL BE EXAMINED FOR 6 WEEKS BEFORE ISSUING A FINAL REPORT     Performed at [facility]   Report Status PENDING   Incomplete  FUNGUS CULTURE W SMEAR     Status: None   Collection Time    12/11/13 12:44 PM      Result Value Ref Range Status   Specimen Description  WOUND   Final   Special Requests LUMBAR 4 AND 5 DISC   Final   Fungal Smear     Final   Value: NO YEAST OR FUNGAL ELEMENTS SEEN     Performed at [facility]   Culture     Final   Value: CULTURE IN PROGRESS FOR FOUR WEEKS     Performed at [facility]   Report Status PENDING   Incomplete  CULTURE, ROUTINE-ABSCESS     Status: None   Collection Time    12/11/13 12:44 PM      Result Value Ref Range Status   Specimen Description ABSCESS BACK   Final   Special Requests L4 L5 DISC ASPIRATE   Final   Gram Stain     Final   Value: NO WBC SEEN     NO SQUAMOUS EPITHELIAL CELLS SEEN     NO ORGANISMS SEEN     Performed at [facility]  Final   Value: NO GROWTH 2 DAYS     Performed at [facility]   Report Status PENDING   Incomplete  URINE CULTURE     Status: None   Collection Time    12/11/13  5:14 PM      Result Value Ref Range Status   Specimen Description URINE, CLEAN CATCH   Final   Special Requests NONE   Final   Culture  Setup Time     Final   Value: 12/11/2013 18:43     Performed at [facility]     Final   Value: NO GROWTH     Performed at [facility]   Culture     Final   Value: NO GROWTH     Performed at [facility]   Report Status 12/12/2013 FINAL   Final  CULTURE, BLOOD (ROUTINE X 2)     Status: None   Collection Time    12/11/13  7:20 PM      Result Value Ref Range Status   Specimen Description BLOOD RIGHT HAND   Final   Special Requests BOTTLES DRAWN AEROBIC ONLY [place]   Final   Culture  Setup Time     Final   Value: 12/12/2013 00:23     Performed at [facility]   Culture     Final   Value:        BLOOD CULTURE RECEIVED NO GROWTH TO DATE CULTURE WILL BE HELD FOR 5 DAYS BEFORE ISSUING A FINAL NEGATIVE REPORT     Performed at [facility]   Report Status PENDING   Incomplete  CULTURE, BLOOD (ROUTINE X 2)     Status: None   Collection Time    12/11/13  7:30 PM       Result Value Ref Range Status   Specimen Description BLOOD LEFT HAND   Final   Special Requests BOTTLES DRAWN AEROBIC ONLY 3.5CC   Final   Culture  Setup Time     Final   Value: 12/12/2013 00:23     Performed at [facility]   Culture     Final   Value:        BLOOD CULTURE RECEIVED NO GROWTH TO DATE CULTURE WILL BE HELD FOR 5 DAYS BEFORE ISSUING A FINAL NEGATIVE REPORT     Performed at [facility]   Report Status PENDING   Incomplete    Radiology Reports Dg Abd 1 View  11/12/2013   CLINICAL DATA:  Colitis.  EXAM: ABDOMEN - 1 VIEW  COMPARISON:  CT of the abdomen and pelvis 11/10/2013.  FINDINGS: Oral contrast material is noted throughout the colon and distal rectum. No pathologic distention of small bowel. No pneumoperitoneum. Multiple surgical clips project over the right upper quadrant of the abdomen. Splenic contour appears enlarged.  IMPRESSION: 1. Nonobstructive bowel gas pattern. 2. No pneumoperitoneum. 3. Splenomegaly.   Electronically Signed   By: Vinnie Langton M.D.   On: 11/12/2013 11:47   Mr Lumbar Spine W Wo Contrast  11/15/2013   CLINICAL DATA:  38 year old male with bacteremia and new back pain. Severe low back pain radiating to the right lower extremity. Initial encounter. HIV, cirrhosis, lymphoma.  EXAM: MRI LUMBAR SPINE WITHOUT AND WITH CONTRAST  TECHNIQUE: Multiplanar and multiecho pulse sequences of the lumbar spine were obtained without and with intravenous contrast.  CONTRAST:  69mL MULTIHANCE GADOBENATE DIMEGLUMINE 529 MG/ML IV SOLN  COMPARISON:  CT Abdomen and Pelvis 11/10/2013 and earlier.  FINDINGS: Normal lumbar segmentation depicted on comparison.  Diffuse loss of normal bone marrow signal in the visible spine, and left iliac bone. However, no marrow edema or destructive osseous lesion identified. No abnormal vertebral enhancement in the spine.  Visualized lower thoracic spinal cord is normal with conus medularis at L1. Normal cauda equina nerve roots.  No abnormal intradural enhancement.  There is mild abnormal STIR and T2 signal in the lower right erector spinae muscles, at the L4 and L5 level (series 5, image 1 and series 6, image 26). This is nonspecific. No definite abnormal enhancement, and other visible paraspinal muscles appear normal. There is mild subcutaneous edema in the lumbar spine, significance doubtful in this setting.  Much of the abdominal viscera are obscured on these images.  T11-T12: Grossly normal.  T12-L1:  Negative.  L1-L2:  Negative.  L2-L3:  Negative.  L3-L4: Mild disc space loss and disc desiccation. Mild circumferential disc osteophyte complex. Mild facet hypertrophy. No significant stenosis.  L4-L5:  Negative except for mild facet hypertrophy.  L5-S1: Disc space loss with disc desiccation and right eccentric circumferential disc osteophyte complex. Focal central component (series 6, image 31), but no spinal or definite lateral recess stenosis. No foraminal stenosis.  IMPRESSION: 1. Diffuse loss of normal bone marrow signal, but may be post treatment affect rather than due to widespread osseous metastatic disease. No destructive osseous lesion identified. 2. No evidence of lumbar discitis osteomyelitis. Mild to moderate chronic appearing disc degeneration at L3-L4 and L5-S1. No spinal stenosis. 3. Mild signal abnormality in the right lower lumbar erector spinae musculature, nonspecific. Early infectious myositis is not excluded in this setting.   Electronically Signed   By: Lars Pinks M.D.   On: 11/15/2013 20:30   Ir Removal [employer] Access W/ Port W/o Fl Mod Sed  11/16/2013   CLINICAL DATA:  History of bacteremia, concern for seeding of patient's chronic Port a Catheter.  EXAM: REMOVAL OF IMPLANTED TUNNELED PORT-A-CATH  MEDICATIONS: The patient is currently admitted to the hospital and receiving intravenous antibiotics; The antibiotic was administered within 1 hour prior to the start of the procedure.  ANESTHESIA/SEDATION: Intravenous  Versed, Fentanyl and Dilaudid was administered for conscious sedation  Sedation time: 20  FLUOROSCOPY TIME:  None  PROCEDURE: Informed written consent was obtained from the patient after a discussion of the risk, benefits and alternatives to the procedure. The patient was positioned supine on the fluoroscopy table and the right chest Port-A-Cath site was prepped with chlorhexidine. A sterile gown and gloves were worn during the procedure. Local anesthesia was provided with 1% lidocaine with epinephrine. A timeout was performed prior to the initiation of the procedure.  An incision was made overlying the Port-A-Cath with a #15 scalpel. Utilizing sharp and blunt dissection, the Port-A-Cath was removed completely. The pocked was irrigated with sterile saline. Wound closure was performed with subcutaneous 3-0 Monocryl, subcuticular 4-0 Vicryl and Dermabond. A dressing was placed. The patient tolerated the procedure well without immediate post procedural complication.  FINDINGS: Successful removal of implant Port-A-Cath without immediate post procedural complication.  Visual inspection of the Port a catheter pocket was negative for any definitive evidence of infection and as such, the incision was closed primarily with absorbable suture as above.  IMPRESSION: Successful removal of implanted Port-A-Cath.   Electronically Signed   By: Sandi Mariscal M.D.   On: 11/16/2013 16:57    CBC  Recent Labs Lab 12/11/13 0500 12/12/13 0800 12/13/13  0050 12/14/13 0325  WBC 4.3 4.4 3.1* 3.3*  HGB 11.3* 10.8* 9.9* 9.5*  HCT 31.6* 30.2* 27.7* 26.8*  PLT 119* 109* 72* 84*  MCV 101.9* 104.1* 104.5* 103.1*  MCH 36.5* 37.2* 37.4* 36.5*  MCHC 35.8 35.8 35.7 35.4  RDW 15.7* 15.9* 15.9* 15.5    Chemistries   Recent Labs Lab 12/10/13 2050 12/11/13 0500 12/12/13 0800 12/13/13 0050 12/14/13 0325  NA 122* 124* 125* 125* 129*  K 4.5 4.5 5.4* 5.0 5.0  CL 88* 92* 93* 92* 96  CO2 22 23 23 24 23   GLUCOSE 103* 86 93 82 77   BUN 24* 21 17 17 13   CREATININE 0.86 0.90 0.89 0.83 0.70  CALCIUM 9.5 9.1 9.0 8.7 8.7  MG  --   --  1.8 1.5 1.8  AST 82* 79*  --   --  53*  ALT 67* 69*  --   --  53  ALKPHOS 123* 117  --   --  115  BILITOT 4.7* 4.3*  --   --  2.4*   ------------------------------------------------------------------------------------------------------------------ estimated creatinine clearance is 137.8 ml/min (by C-G formula based on Cr of 0.7). ------------------------------------------------------------------------------------------------------------------ No results found for this basename: HGBA1C,  in the last 72 hours ------------------------------------------------------------------------------------------------------------------ No results found for this basename: CHOL, HDL, LDLCALC, TRIG, CHOLHDL, LDLDIRECT,  in the last 72 hours ------------------------------------------------------------------------------------------------------------------ No results found for this basename: TSH, T4TOTAL, FREET3, T3FREE, THYROIDAB,  in the last 72 hours ------------------------------------------------------------------------------------------------------------------ No results found for this basename: VITAMINB12, FOLATE, FERRITIN, TIBC, IRON, RETICCTPCT,  in the last 72 hours  Coagulation profile  Recent Labs Lab 12/10/13 2050 12/11/13 1035  INR 1.49 1.53*    No results found for this basename: DDIMER,  in the last 72 hours  Cardiac Enzymes No results found for this basename: CK, CKMB, TROPONINI, MYOGLOBIN,  in the last 168 hours ------------------------------------------------------------------------------------------------------------------ No components found with this basename: POCBNP,      Time Spent in minutes   35   SINGH,PRASHANT K M.D on 12/14/2013 at 9:44 AM  Between 7am to 7pm - Pager - [phone]  After 7pm go to [email or web address] - password [email or web address]  And look for the night coverage  person covering for me after hours  [facility] Office  [phone]   **Disclaimer: This note may have been dictated with voice recognition software. Similar sounding words can inadvertently be transcribed and this note may contain transcription errors which may not have been corrected upon publication of note.**

## 2013-12-14 NOTE — H&P (Signed)
Samuel Schultz is an 38 y.o. male.   Chief Complaint: back pain since 11/2013 Hx Lymphoma- PAC removed 11/11/13 Work up revealed L3-4 discitis Pt had Lumbar 3-4 disc aspiration 12/11/13 No growth to date Pt still with severe back pain ID MD has asked IR to repeat aspiration 7/10 Pt has been off antibiotics x 48 hrs  HPI: Lymphoma; HIV; cirrhosis  Past Medical History  Diagnosis Date  . Cancer   . Lymphoma   . HIV disease   . Cirrhosis   . PTSD (post-traumatic stress disorder)   . Depressive disorder     Past Surgical History  Procedure Laterality Date  . Cholecystectomy    . Appendectomy    . Tee without cardioversion N/A 11/13/2013    Procedure: TRANSESOPHAGEAL ECHOCARDIOGRAM (TEE);  Surgeon: Dorothy Spark, MD;  Location: [facility] ENDOSCOPY;  Service: Cardiovascular;  Laterality: N/A;    Family History  Problem Relation Age of Onset  . Cancer Maternal Grandmother     breast ca  . Cancer Maternal Grandfather     colon ca   Social History:  reports that he has never smoked. He has never used smokeless tobacco. He reports that he does not drink alcohol or use illicit drugs.  Allergies:  Allergies  Allergen Reactions  . Codeine Anaphylaxis, Hives, Nausea Only, Swelling and Nausea And Vomiting  . Oxycodone     Other reaction(s): Dizziness Lightheaded, nausea    Medications Prior to Admission  Medication Sig Dispense Refill  . azithromycin (ZITHROMAX) 600 MG tablet Take 1,200 mg by mouth once a week. On fridays. Last dose was 12/08/2013. Next dose 12/15/2013.      . cyclobenzaprine (FLEXERIL) 5 MG tablet Take 1 tablet (5 mg total) by mouth at bedtime.  30 tablet  0  . Darunavir Ethanolate (PREZISTA) 800 MG tablet Take 800 mg by mouth daily.      . diphenoxylate-atropine (LOMOTIL) 2.5-0.025 MG per tablet Take 2 tablets by mouth 3 (three) times daily.  180 tablet  0  . emtricitabine-tenofovir (TRUVADA) 200-300 MG per tablet Take 1 tablet by mouth at bedtime.      Marland Kitchen escitalopram  (LEXAPRO) 20 MG tablet Take 20 mg by mouth daily.      . furosemide (LASIX) 40 MG tablet Take 40 mg by mouth daily.      Marland Kitchen gabapentin (NEURONTIN) 300 MG capsule Take 300 mg by mouth 3 (three) times daily.      . pantoprazole (PROTONIX) 40 MG tablet Take 40 mg by mouth 2 (two) times daily.      . phytonadione (VITAMIN K) 5 MG tablet Take 10 mg by mouth daily.      . ritonavir (NORVIR) 100 MG capsule Take 100 mg by mouth daily with breakfast.      . spironolactone (ALDACTONE) 100 MG tablet Take 100 mg by mouth daily.      . sucralfate (CARAFATE) 1 G tablet Take 1 g by mouth 3 (three) times daily with meals.       Marland Kitchen atovaquone (MEPRON) 750 MG/5ML suspension Take 1,500 mg by mouth daily.      . feeding supplement, RESOURCE BREEZE, (RESOURCE BREEZE) LIQD Take 1 Container by mouth 3 (three) times daily between meals.    0  . morphine (MS CONTIN) 30 MG 12 hr tablet Take 1 tablet (30 mg total) by mouth 2 (two) times daily.  60 tablet  0    Results for orders placed during the hospital encounter of 12/10/13 (from the past  48 hour(s))  OSMOLALITY, URINE     Status: Abnormal   Collection Time    12/12/13  3:44 PM      Result Value Ref Range   Osmolality, Ur 337 (*) 390 - 1090 mOsm/kg   Comment: Performed at [facility]     Status: Abnormal   Collection Time    12/13/13 12:50 AM      Result Value Ref Range   Sodium 125 (*) 137 - 147 mEq/L   Potassium 5.0  3.7 - 5.3 mEq/L   Chloride 92 (*) 96 - 112 mEq/L   CO2 24  19 - 32 mEq/L   Glucose, Bld 82  70 - 99 mg/dL   BUN 17  6 - 23 mg/dL   Creatinine, Ser 0.83  0.50 - 1.35 mg/dL   Calcium 8.7  8.4 - 10.5 mg/dL   GFR calc non Af Amer >90  >90 mL/min   GFR calc Af Amer >90  >90 mL/min   Comment: (NOTE)     The eGFR has been calculated using the CKD EPI equation.     This calculation has not been validated in all clinical situations.     eGFR's persistently <90 mL/min signify possible Chronic Kidney     Disease.    Anion gap 9  5 - 15  CBC     Status: Abnormal   Collection Time    12/13/13 12:50 AM      Result Value Ref Range   WBC 3.1 (*) 4.0 - 10.5 K/uL   RBC 2.65 (*) 4.22 - 5.81 MIL/uL   Hemoglobin 9.9 (*) 13.0 - 17.0 g/dL   HCT 27.7 (*) 39.0 - 52.0 %   MCV 104.5 (*) 78.0 - 100.0 fL   MCH 37.4 (*) 26.0 - 34.0 pg   MCHC 35.7  30.0 - 36.0 g/dL   RDW 15.9 (*) 11.5 - 15.5 %   Platelets 72 (*) 150 - 400 K/uL   Comment: REPEATED TO VERIFY     PLATELET COUNT CONFIRMED BY SMEAR     DELTA CHECK NOTED  MAGNESIUM     Status: None   Collection Time    12/13/13 12:50 AM      Result Value Ref Range   Magnesium 1.5  1.5 - 2.5 mg/dL  URIC ACID     Status: None   Collection Time    12/13/13  6:13 PM      Result Value Ref Range   Uric Acid, Serum 4.8  4.0 - 7.8 mg/dL  CBC     Status: Abnormal   Collection Time    12/14/13  3:25 AM      Result Value Ref Range   WBC 3.3 (*) 4.0 - 10.5 K/uL   RBC 2.60 (*) 4.22 - 5.81 MIL/uL   Hemoglobin 9.5 (*) 13.0 - 17.0 g/dL   HCT 26.8 (*) 39.0 - 52.0 %   MCV 103.1 (*) 78.0 - 100.0 fL   MCH 36.5 (*) 26.0 - 34.0 pg   MCHC 35.4  30.0 - 36.0 g/dL   RDW 15.5  11.5 - 15.5 %   Platelets 84 (*) 150 - 400 K/uL   Comment: CONSISTENT WITH PREVIOUS RESULT  MAGNESIUM     Status: None   Collection Time    12/14/13  3:25 AM      Result Value Ref Range   Magnesium 1.8  1.5 - 2.5 mg/dL  COMPREHENSIVE METABOLIC PANEL     Status:  Abnormal   Collection Time    12/14/13  3:25 AM      Result Value Ref Range   Sodium 129 (*) 137 - 147 mEq/L   Potassium 5.0  3.7 - 5.3 mEq/L   Chloride 96  96 - 112 mEq/L   CO2 23  19 - 32 mEq/L   Glucose, Bld 77  70 - 99 mg/dL   BUN 13  6 - 23 mg/dL   Creatinine, Ser 0.70  0.50 - 1.35 mg/dL   Calcium 8.7  8.4 - 10.5 mg/dL   Total Protein 7.3  6.0 - 8.3 g/dL   Albumin 2.4 (*) 3.5 - 5.2 g/dL   AST 53 (*) 0 - 37 U/L   ALT 53  0 - 53 U/L   Alkaline Phosphatase 115  39 - 117 U/L   Total Bilirubin 2.4 (*) 0.3 - 1.2 mg/dL   GFR calc non Af  Amer >90  >90 mL/min   GFR calc Af Amer >90  >90 mL/min   Comment: (NOTE)     The eGFR has been calculated using the CKD EPI equation.     This calculation has not been validated in all clinical situations.     eGFR's persistently <90 mL/min signify possible Chronic Kidney     Disease.   Anion gap 10  5 - 15   Dg Foot Complete Left  12/13/2013   CLINICAL DATA:  Left foot pain.  EXAM: LEFT FOOT - COMPLETE 3+ VIEW  COMPARISON:  None.  FINDINGS: There are slight arthritic changes of the first metatarsal phalangeal joint. The bones are otherwise normal other than a tiny plantar calcaneal spur.  IMPRESSION: Minimal degenerative changes of the first metatarsal phalangeal joint.   Electronically Signed   By: Rozetta Nunnery M.D.   On: 12/13/2013 19:09    Review of Systems  Constitutional: Positive for weight loss. Negative for fever.  Respiratory: Negative for shortness of breath.   Cardiovascular: Negative for chest pain.  Gastrointestinal: Positive for nausea. Negative for vomiting and abdominal pain.  Musculoskeletal: Positive for back pain.  Neurological: Positive for weakness. Negative for dizziness.    Blood pressure 103/46, pulse 92, temperature 98.3 F (36.8 C), temperature source Oral, resp. rate 20, height 5' 9"  (1.753 m), weight 88.451 kg (195 lb), SpO2 98.00%. Physical Exam  Constitutional: He is oriented to person, place, and time. He appears well-developed.  Cardiovascular: Normal rate.   No murmur heard. Respiratory: Effort normal. He has no wheezes.  GI: Soft. There is no tenderness.  Musculoskeletal: Normal range of motion.  Back pain  Neurological: He is alert and oriented to person, place, and time.  Skin: Skin is warm and dry.  Psychiatric: He has a normal mood and affect. His behavior is normal. Judgment and thought content normal.     Assessment/Plan Scheduled for repeat L3-4 disc aspiration 7/10 in IR Pt aware of procedure benefits and risks and agreeable to  proceed Consent signed and in chart  Cresta Riden A 12/14/2013, 1:02 PM

## 2013-12-14 NOTE — Consult Note (Signed)
Reason for Consult: Left foot pain Referring Physician: Dr. Nelly Rout Samuel Schultz is an 38 y.o. male.  HPI: Patient is a 38 year old gentleman who reports no history of trauma. He states he's had one week history of acute pain in his left foot. Patient denies any type of open wound foreign body or cellulitis.  Past Medical History  Diagnosis Date  . Cancer   . Lymphoma   . HIV disease   . Cirrhosis   . PTSD (post-traumatic stress disorder)   . Depressive disorder     Past Surgical History  Procedure Laterality Date  . Cholecystectomy    . Appendectomy    . Tee without cardioversion N/A 11/13/2013    Procedure: TRANSESOPHAGEAL ECHOCARDIOGRAM (TEE);  Surgeon: Dorothy Spark, MD;  Location: [facility] ENDOSCOPY;  Service: Cardiovascular;  Laterality: N/A;    Family History  Problem Relation Age of Onset  . Cancer Maternal Grandmother     breast ca  . Cancer Maternal Grandfather     colon ca    Social History:  reports that he has never smoked. He has never used smokeless tobacco. He reports that he does not drink alcohol or use illicit drugs.  Allergies:  Allergies  Allergen Reactions  . Codeine Anaphylaxis, Hives, Nausea Only, Swelling and Nausea And Vomiting  . Oxycodone     Other reaction(s): Dizziness Lightheaded, nausea    Medications: I have reviewed the patient's current medications.  Results for orders placed during the hospital encounter of 12/10/13 (from the past 48 hour(s))  BASIC METABOLIC PANEL     Status: Abnormal   Collection Time    12/12/13  8:00 AM      Result Value Ref Range   Sodium 125 (*) 137 - 147 mEq/L   Potassium 5.4 (*) 3.7 - 5.3 mEq/L   Chloride 93 (*) 96 - 112 mEq/L   CO2 23  19 - 32 mEq/L   Glucose, Bld 93  70 - 99 mg/dL   BUN 17  6 - 23 mg/dL   Creatinine, Ser 0.89  0.50 - 1.35 mg/dL   Calcium 9.0  8.4 - 10.5 mg/dL   GFR calc non Af Amer >90  >90 mL/min   GFR calc Af Amer >90  >90 mL/min   Comment: (NOTE)     The eGFR has been calculated  using the CKD EPI equation.     This calculation has not been validated in all clinical situations.     eGFR's persistently <90 mL/min signify possible Chronic Kidney     Disease.   Anion gap 9  5 - 15  CBC     Status: Abnormal   Collection Time    12/12/13  8:00 AM      Result Value Ref Range   WBC 4.4  4.0 - 10.5 K/uL   RBC 2.90 (*) 4.22 - 5.81 MIL/uL   Hemoglobin 10.8 (*) 13.0 - 17.0 g/dL   HCT 30.2 (*) 39.0 - 52.0 %   MCV 104.1 (*) 78.0 - 100.0 fL   MCH 37.2 (*) 26.0 - 34.0 pg   MCHC 35.8  30.0 - 36.0 g/dL   RDW 15.9 (*) 11.5 - 15.5 %   Platelets 109 (*) 150 - 400 K/uL   Comment: PLATELET COUNT CONFIRMED BY SMEAR  MAGNESIUM     Status: None   Collection Time    12/12/13  8:00 AM      Result Value Ref Range   Magnesium 1.8  1.5 - 2.5  mg/dL  OSMOLALITY, URINE     Status: Abnormal   Collection Time    12/12/13  3:44 PM      Result Value Ref Range   Osmolality, Ur 337 (*) 390 - 1090 mOsm/kg   Comment: Performed at [facility]     Status: Abnormal   Collection Time    12/13/13 12:50 AM      Result Value Ref Range   Sodium 125 (*) 137 - 147 mEq/L   Potassium 5.0  3.7 - 5.3 mEq/L   Chloride 92 (*) 96 - 112 mEq/L   CO2 24  19 - 32 mEq/L   Glucose, Bld 82  70 - 99 mg/dL   BUN 17  6 - 23 mg/dL   Creatinine, Ser 0.83  0.50 - 1.35 mg/dL   Calcium 8.7  8.4 - 10.5 mg/dL   GFR calc non Af Amer >90  >90 mL/min   GFR calc Af Amer >90  >90 mL/min   Comment: (NOTE)     The eGFR has been calculated using the CKD EPI equation.     This calculation has not been validated in all clinical situations.     eGFR's persistently <90 mL/min signify possible Chronic Kidney     Disease.   Anion gap 9  5 - 15  CBC     Status: Abnormal   Collection Time    12/13/13 12:50 AM      Result Value Ref Range   WBC 3.1 (*) 4.0 - 10.5 K/uL   RBC 2.65 (*) 4.22 - 5.81 MIL/uL   Hemoglobin 9.9 (*) 13.0 - 17.0 g/dL   HCT 27.7 (*) 39.0 - 52.0 %   MCV 104.5 (*) 78.0 -  100.0 fL   MCH 37.4 (*) 26.0 - 34.0 pg   MCHC 35.7  30.0 - 36.0 g/dL   RDW 15.9 (*) 11.5 - 15.5 %   Platelets 72 (*) 150 - 400 K/uL   Comment: REPEATED TO VERIFY     PLATELET COUNT CONFIRMED BY SMEAR     DELTA CHECK NOTED  MAGNESIUM     Status: None   Collection Time    12/13/13 12:50 AM      Result Value Ref Range   Magnesium 1.5  1.5 - 2.5 mg/dL  URIC ACID     Status: None   Collection Time    12/13/13  6:13 PM      Result Value Ref Range   Uric Acid, Serum 4.8  4.0 - 7.8 mg/dL  CBC     Status: Abnormal   Collection Time    12/14/13  3:25 AM      Result Value Ref Range   WBC 3.3 (*) 4.0 - 10.5 K/uL   RBC 2.60 (*) 4.22 - 5.81 MIL/uL   Hemoglobin 9.5 (*) 13.0 - 17.0 g/dL   HCT 26.8 (*) 39.0 - 52.0 %   MCV 103.1 (*) 78.0 - 100.0 fL   MCH 36.5 (*) 26.0 - 34.0 pg   MCHC 35.4  30.0 - 36.0 g/dL   RDW 15.5  11.5 - 15.5 %   Platelets 84 (*) 150 - 400 K/uL   Comment: CONSISTENT WITH PREVIOUS RESULT  MAGNESIUM     Status: None   Collection Time    12/14/13  3:25 AM      Result Value Ref Range   Magnesium 1.8  1.5 - 2.5 mg/dL  COMPREHENSIVE METABOLIC PANEL     Status: Abnormal  Collection Time    12/14/13  3:25 AM      Result Value Ref Range   Sodium 129 (*) 137 - 147 mEq/L   Potassium 5.0  3.7 - 5.3 mEq/L   Chloride 96  96 - 112 mEq/L   CO2 23  19 - 32 mEq/L   Glucose, Bld 77  70 - 99 mg/dL   BUN 13  6 - 23 mg/dL   Creatinine, Ser 0.70  0.50 - 1.35 mg/dL   Calcium 8.7  8.4 - 10.5 mg/dL   Total Protein 7.3  6.0 - 8.3 g/dL   Albumin 2.4 (*) 3.5 - 5.2 g/dL   AST 53 (*) 0 - 37 U/L   ALT 53  0 - 53 U/L   Alkaline Phosphatase 115  39 - 117 U/L   Total Bilirubin 2.4 (*) 0.3 - 1.2 mg/dL   GFR calc non Af Amer >90  >90 mL/min   GFR calc Af Amer >90  >90 mL/min   Comment: (NOTE)     The eGFR has been calculated using the CKD EPI equation.     This calculation has not been validated in all clinical situations.     eGFR's persistently <90 mL/min signify possible Chronic Kidney      Disease.   Anion gap 10  5 - 15    Dg Foot Complete Left  12/13/2013   CLINICAL DATA:  Left foot pain.  EXAM: LEFT FOOT - COMPLETE 3+ VIEW  COMPARISON:  None.  FINDINGS: There are slight arthritic changes of the first metatarsal phalangeal joint. The bones are otherwise normal other than a tiny plantar calcaneal spur.  IMPRESSION: Minimal degenerative changes of the first metatarsal phalangeal joint.   Electronically Signed   By: Rozetta Nunnery M.D.   On: 12/13/2013 19:09    Review of Systems  All other systems reviewed and are negative.  Blood pressure 103/46, pulse 92, temperature 98.3 F (36.8 C), temperature source Oral, resp. rate 20, height 5' 9"  (1.753 m), weight 88.451 kg (195 lb), SpO2 98.00%. Physical Exam On examination patient has a good dorsalis pedis and posterior tibial pulse. He has normal alignment of his foot there is no redness no cellulitis no adenopathy no open wounds no loss of the normal alignment of his foot. Patient is tender to palpation of the great toe MTP joint as well as tender to palpation across the Lisfranc joint. Review of the radiographs shows no evidence of any destructive changes patient does have some mild periarticular cystic changes no evidence of any vascular abnormalities. There is no ischemic changes to the foot. He has good capillary refill. Review of his labs shows a diminished white cell count no signs of infection he does have a normal uric acid no laboratory findings consistent with gout however his symptoms are consistent with gout or pseudogout. Assessment/Plan: Assessment: Possible pseudogout left foot.  Orders written for prednisone and colchicine. I will reevaluate tomorrow see how patient progresses.  Samuel Schultz,Samuel Schultz 12/14/2013, 7:05 AM

## 2013-12-14 NOTE — Progress Notes (Signed)
[facility]    Subjective: Pain worse, left big toe, plain films suggest "degenerative disease"  Antibiotics:  Anti-infectives   Start     Dose/Rate Route Frequency Ordered Stop   12/15/13 1000  azithromycin (ZITHROMAX) tablet 1,200 mg     1,200 mg Oral Every Fri 12/10/13 2050     12/11/13 1000  atovaquone (MEPRON) 750 MG/5ML suspension 1,500 mg  Status:  Discontinued     1,500 mg Oral Daily 12/10/13 2050 12/14/13 0953   12/11/13 0800  Darunavir Ethanolate (PREZISTA) tablet 800 mg     800 mg Oral Daily with breakfast 12/10/13 2050     12/11/13 0800  ritonavir (NORVIR) capsule 100 mg     100 mg Oral Daily with breakfast 12/10/13 2050     12/10/13 2200  emtricitabine-tenofovir (TRUVADA) 200-300 MG per tablet 1 tablet     1 tablet Oral Daily at bedtime 12/10/13 2050        Medications: Scheduled Meds: . [START ON 12/15/2013] azithromycin  1,200 mg Oral Q Fri  . colchicine  0.6 mg Oral Daily  . cyclobenzaprine  5 mg Oral QHS  . Darunavir Ethanolate  800 mg Oral Q breakfast  . docusate sodium  100 mg Oral BID  . emtricitabine-tenofovir  1 tablet Oral QHS  . feeding supplement (RESOURCE BREEZE)  1 Container Oral BID PC  . furosemide  40 mg Oral BID  . gabapentin  300 mg Oral TID  . lactose free nutrition  237 mL Oral BID  . morphine  15 mg Oral BID  . pantoprazole  40 mg Oral BID  . phytonadione  10 mg Oral Daily  . ritonavir  100 mg Oral Q breakfast  . sodium chloride  3 mL Intravenous Q12H  . sucralfate  1 g Oral TID WC   Continuous Infusions:  PRN Meds:.acetaminophen, HYDROmorphone (DILAUDID) injection, ondansetron (ZOFRAN) IV    Objective: Weight change:   Intake/Output Summary (Last 24 hours) at 12/14/13 0954 Last data filed at 12/14/13 2836  Gross per 24 hour  Intake   1200 ml  Output   2875 ml  Net  -1675 ml   Blood pressure 103/46, pulse 92, temperature 98.3 F (36.8 C), temperature source Oral, resp. rate 20, height 5\' 9"   (1.753 m), weight 195 lb (88.451 kg), SpO2 98.00%. Temp:  [97.8 F (36.6 C)-98.4 F (36.9 C)] 98.3 F (36.8 C) (07/09 0612) Pulse Rate:  [92-104] 92 (07/09 0612) Resp:  [16-20] 20 (07/09 0612) BP: (103-107)/(45-55) 103/46 mmHg (07/09 0612) SpO2:  [96 %-98 %] 98 % (07/09 0612)  Physical Exam: General: Alert and awake, oriented x3, pale coarse voice and clearly in pain  HEENT: anicteric sclera, pupils reactive to light and accommodation, EOMI, oropharynx clear and without exudate  CVS regular rate, normal r, no murmur rubs or gallops  Chest: clear to auscultation bilaterally, no wheezing, rales or rhonchi  Abdomen: soft nontender, nondistended, normal bowel sounds,  Extremities: tenderness in big toe Neuro: No focal deficits but has severe pain with trying to raise his right leg in particular  CBC:  Recent Labs Lab 12/10/13 2050 12/11/13 0500 12/11/13 1035 12/12/13 0800 12/13/13 0050 12/14/13 0325  HGB  --  11.3*  --  10.8* 9.9* 9.5*  HCT  --  31.6*  --  30.2* 27.7* 26.8*  PLT  --  119*  --  109* 72* 84*  INR 1.49  --  1.53*  --   --   --  BMET  Recent Labs  12/13/13 0050 12/14/13 0325  NA 125* 129*  K 5.0 5.0  CL 92* 96  CO2 24 23  GLUCOSE 82 77  BUN 17 13  CREATININE 0.83 0.70  CALCIUM 8.7 8.7     Liver Panel   Recent Labs  12/14/13 0325  PROT 7.3  ALBUMIN 2.4*  AST 53*  ALT 53  ALKPHOS 115  BILITOT 2.4*       Sedimentation Rate No results found for this basename: ESRSEDRATE,  in the last 72 hours C-Reactive Protein No results found for this basename: CRP,  in the last 72 hours  Micro Results: Recent Results (from the past 720 hour(s))  CULTURE, BLOOD (ROUTINE X 2)     Status: None   Collection Time    11/17/13  1:00 PM      Result Value Ref Range Status   Specimen Description BLOOD LEFT ANTECUBITAL   Final   Special Requests BOTTLES DRAWN AEROBIC AND ANAEROBIC 10CC   Final   Culture  Setup Time     Final   Value: 11/17/2013  16:48     Performed at [facility]   Culture     Final   Value: NO GROWTH 5 DAYS     Performed at [facility]   Report Status 11/23/2013 FINAL   Final  CULTURE, BLOOD (ROUTINE X 2)     Status: None   Collection Time    11/17/13  1:10 PM      Result Value Ref Range Status   Specimen Description BLOOD RIGHT HAND   Final   Special Requests BOTTLES DRAWN AEROBIC ONLY 2CC   Final   Culture  Setup Time     Final   Value: 11/17/2013 16:48     Performed at [facility]   Culture     Final   Value: NO GROWTH 5 DAYS     Performed at [facility]   Report Status 11/23/2013 FINAL   Final  AFB CULTURE WITH SMEAR     Status: None   Collection Time    12/11/13 12:44 PM      Result Value Ref Range Status   Specimen Description WOUND   Final   Special Requests LUMBAR 4 AND 5 DISC   Final   Acid Fast Smear     Final   Value: NO ACID FAST BACILLI SEEN     Performed at [facility]   Culture     Final   Value: CULTURE WILL BE EXAMINED FOR 6 WEEKS BEFORE ISSUING A FINAL REPORT     Performed at [facility]   Report Status PENDING   Incomplete  FUNGUS CULTURE W SMEAR     Status: None   Collection Time    12/11/13 12:44 PM      Result Value Ref Range Status   Specimen Description WOUND   Final   Special Requests LUMBAR 4 AND 5 DISC   Final   Fungal Smear     Final   Value: NO YEAST OR FUNGAL ELEMENTS SEEN     Performed at [facility]   Culture     Final   Value: CULTURE IN PROGRESS FOR FOUR WEEKS     Performed at [facility]   Report Status PENDING   Incomplete  CULTURE, ROUTINE-ABSCESS     Status: None   Collection Time    12/11/13 12:44 PM  Result Value Ref Range Status   Specimen Description ABSCESS BACK   Final   Special Requests L4 L5 DISC ASPIRATE   Final   Gram Stain     Final   Value: NO WBC SEEN     NO SQUAMOUS EPITHELIAL CELLS SEEN     NO ORGANISMS SEEN     Performed at [facility]   Culture      Final   Value: NO GROWTH 2 DAYS     Performed at [facility]   Report Status PENDING   Incomplete  URINE CULTURE     Status: None   Collection Time    12/11/13  5:14 PM      Result Value Ref Range Status   Specimen Description URINE, CLEAN CATCH   Final   Special Requests NONE   Final   Culture  Setup Time     Final   Value: 12/11/2013 18:43     Performed at [facility] [place]     Final   Value: NO GROWTH     Performed at [facility]   Culture     Final   Value: NO GROWTH     Performed at [facility]   Report Status 12/12/2013 FINAL   Final  CULTURE, BLOOD (ROUTINE X 2)     Status: None   Collection Time    12/11/13  7:20 PM      Result Value Ref Range Status   Specimen Description BLOOD RIGHT HAND   Final   Special Requests BOTTLES DRAWN AEROBIC ONLY [place]   Final   Culture  Setup Time     Final   Value: 12/12/2013 00:23     Performed at [facility]   Culture     Final   Value:        BLOOD CULTURE RECEIVED NO GROWTH TO DATE CULTURE WILL BE HELD FOR 5 DAYS BEFORE ISSUING A FINAL NEGATIVE REPORT     Performed at [facility]   Report Status PENDING   Incomplete  CULTURE, BLOOD (ROUTINE X 2)     Status: None   Collection Time    12/11/13  7:30 PM      Result Value Ref Range Status   Specimen Description BLOOD LEFT HAND   Final   Special Requests BOTTLES DRAWN AEROBIC ONLY 3.5CC   Final   Culture  Setup Time     Final   Value: 12/12/2013 00:23     Performed at [facility]   Culture     Final   Value:        BLOOD CULTURE RECEIVED NO GROWTH TO DATE CULTURE WILL BE HELD FOR 5 DAYS BEFORE ISSUING A FINAL NEGATIVE REPORT     Performed at [facility]   Report Status PENDING   Incomplete    Studies/Results: Dg Foot Complete Left  12/13/2013   CLINICAL DATA:  Left foot pain.  EXAM: LEFT FOOT - COMPLETE 3+ VIEW  COMPARISON:  None.  FINDINGS: There are slight arthritic changes of the first metatarsal  phalangeal joint. The bones are otherwise normal other than a tiny plantar calcaneal spur.  IMPRESSION: Minimal degenerative changes of the first metatarsal phalangeal joint.   Electronically Signed   By: Rozetta Nunnery M.D.   On: 12/13/2013 19:09      Assessment/Plan:  Active Problems:   AIDS   Large cell lymphoma   Cirrhosis, non-alcoholic  Protein-calorie malnutrition, severe   Discitis   Malnutrition of moderate degree    Samuel Schultz is a 38 y.o. 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 He got a dose of vancomycin and another antibiotic he thinks at [facility] unfortunately prior to transfer here. He's undergone I are guided biopsy cx negative to date  He has toe pain and now plain films c/w degnerative disease. Dr. Sharol Given has seen pt and believes he may have gout vs pseudogout  #1 osteomyelitis with vertebral infection and discitis:   --Continue to hold antibiotics and followup cultures   --I FEEL STRONGLY THAT HE SHOULD HAVE A REPEAT ASPIRATE OF DISK SPACE ON Friday IF BACTERIAL CULTURES STILL NEGATIVE WITH ASPIRATE SENT FOR BACTERIAL CULTURES ONLY  --ONCE WE HAVE THOSE CULTURES WE CAN START HIS ANTIBIOTICS UP, LIKELY BROAD ONES SUCH AS DAPTOMYCIN AT 8MG /KG + IMIPENEM if nothing grows   --I am most suspicious for AMP sensitive ENterococcus and think that the VRE and Candida Could have been contaminants that we saw before (though his family do not want to risk of not treating them.)   #2 Toe pain: I will get an MRI of the toe. Proceeding with colchicine  seems reasonable in case this is gout but I am not comfortable with prednisone given the fact that he has KNOWN, untreated diskitis as we await another biopsy for cultures.  Given his recent known bacteremia concern of course on my part is for seeding of other sites besides back  #2 HIV/AIDS: continue as Prezista Norvir Truvada, will resume prophylactic antibiotics after we have nailed down what is going on in his vertebral osteomyelitis. Hold off his mepron as well pending biopsy tomorrow.        LOS: 4 days   Alcide Evener 12/14/2013, 9:54 AM

## 2013-12-15 ENCOUNTER — Inpatient Hospital Stay (HOSPITAL_COMMUNITY): Payer: Medicaid Other

## 2013-12-15 LAB — BASIC METABOLIC PANEL
Anion gap: 11 (ref 5–15)
BUN: 14 mg/dL (ref 6–23)
CALCIUM: 9 mg/dL (ref 8.4–10.5)
CO2: 23 meq/L (ref 19–32)
CREATININE: 0.69 mg/dL (ref 0.50–1.35)
Chloride: 97 mEq/L (ref 96–112)
GFR calc Af Amer: >90 mL/min (ref >90)
GFR calc non Af Amer: >90 mL/min (ref >90)
Glucose, Bld: 79 mg/dL (ref 70–99)
Potassium: 5 mEq/L (ref 3.7–5.3)
Sodium: 131 mEq/L — ABNORMAL LOW (ref 137–147)

## 2013-12-15 LAB — CULTURE, ROUTINE-ABSCESS
Culture: NO GROWTH
GRAM STAIN: NONE SEEN

## 2013-12-15 LAB — PROTIME-INR
INR: 1.5 — ABNORMAL HIGH (ref 0.00–1.49)
Prothrombin Time: 18.1 seconds — ABNORMAL HIGH (ref 11.6–15.2)

## 2013-12-15 LAB — CBC
HCT: 27.2 % — ABNORMAL LOW (ref 39.0–52.0)
HEMOGLOBIN: 9.7 g/dL — AB (ref 13.0–17.0)
MCH: 37 pg — AB (ref 26.0–34.0)
MCHC: 35.7 g/dL (ref 30.0–36.0)
MCV: 103.8 fL — AB (ref 78.0–100.0)
Platelets: 77 10*3/uL — ABNORMAL LOW (ref 150–400)
RBC: 2.62 MIL/uL — ABNORMAL LOW (ref 4.22–5.81)
RDW: 15.5 % (ref 11.5–15.5)
WBC: 3.2 10*3/uL — ABNORMAL LOW (ref 4.0–10.5)

## 2013-12-15 LAB — CULTURE, BLOOD (SINGLE)

## 2013-12-15 LAB — MAGNESIUM: Magnesium: 1.7 mg/dL (ref 1.5–2.5)

## 2013-12-15 MED ORDER — SODIUM CHLORIDE 0.9 % IV SOLN
INTRAVENOUS | Status: DC
  Administered 2013-12-15: 11:00:00 via INTRAVENOUS

## 2013-12-15 MED ORDER — SODIUM CHLORIDE 0.9 % IV SOLN
INTRAVENOUS | Status: AC | PRN
  Administered 2013-12-15: 150 mL/h via INTRAVENOUS

## 2013-12-15 MED ORDER — MIDAZOLAM HCL 2 MG/2ML IJ SOLN
INTRAMUSCULAR | Status: AC | PRN
  Administered 2013-12-15 (×2): 0.5 mg via INTRAVENOUS

## 2013-12-15 MED ORDER — FENTANYL CITRATE 0.05 MG/ML IJ SOLN
INTRAMUSCULAR | Status: AC
  Filled 2013-12-15: qty 2

## 2013-12-15 MED ORDER — KETOROLAC TROMETHAMINE 15 MG/ML IJ SOLN
15.0000 mg | Freq: Three times a day (TID) | INTRAMUSCULAR | Status: AC | PRN
  Administered 2013-12-16 – 2013-12-17 (×3): 15 mg via INTRAVENOUS
  Filled 2013-12-15 (×4): qty 1

## 2013-12-15 MED ORDER — FENTANYL CITRATE 0.05 MG/ML IJ SOLN
INTRAMUSCULAR | Status: AC | PRN
  Administered 2013-12-15: 25 ug via INTRAVENOUS
  Administered 2013-12-15 (×2): 12.5 ug via INTRAVENOUS
  Administered 2013-12-15: 25 ug via INTRAVENOUS

## 2013-12-15 MED ORDER — SODIUM CHLORIDE 0.9 % IV SOLN
700.0000 mg | INTRAVENOUS | Status: DC
  Administered 2013-12-15 – 2013-12-19 (×5): 700 mg via INTRAVENOUS
  Filled 2013-12-15 (×10): qty 14

## 2013-12-15 MED ORDER — MIDAZOLAM HCL 2 MG/2ML IJ SOLN
INTRAMUSCULAR | Status: AC
  Filled 2013-12-15: qty 2

## 2013-12-15 NOTE — Procedures (Signed)
S/P  Fluoro  guided  L3-L4 disc aspiration

## 2013-12-15 NOTE — Progress Notes (Signed)
Patient now agreeable to remain within the hospital as long as his attending physician is changed.  Physician to be changed in am 12/16/13.  Needs attended to.  Will continue to monitor patient.  Dirk Dress 12/15/2013

## 2013-12-15 NOTE — Progress Notes (Signed)
PT Cancellation Note  Patient Details Name: Samuel Schultz MRN: [id number] DOB: 14-Mar-1976   Cancelled Treatment:    Reason Eval/Treat Not Completed: Patient at procedure or test/unavailable. Pt off the floor for L3-L4 aspiration. Will re-attempt  To see pt as time allows.    Elie Confer [place], [place] 12/15/2013, 9:42 AM

## 2013-12-15 NOTE — Significant Event (Signed)
Patient stating that he is leaving this hospital.  Patient states that he is angry that his fluids are restricted and that he remains on a heart healthy diet and not a regular diet.  Patient also states that he does not feel that he is being listened to.  Patient has taken his intravenous line as well telemetry monitor off.  Patient states that he will have someone come to pick him up.  Patient informed of risks associated with leaving hospital.  Patient has recently returned from lumbar puncture and is still requiring bedrest after procedure.  Patient is refusing to sign AMA form.  Charge nurse as well as Dr. Candiss Norse informed.  Will continue to monitor.   Dirk Dress 12/15/2013

## 2013-12-15 NOTE — Progress Notes (Signed)
ANTIBIOTIC CONSULT NOTE - INITIAL  Pharmacy Consult for Daptomycin Indication: Discitis  Allergies  Allergen Reactions  . Codeine Anaphylaxis, Hives, Nausea Only, Swelling and Nausea And Vomiting  . Oxycodone     Other reaction(s): Dizziness Lightheaded, nausea   Patient Measurements: Height: 5\' 9"  (175.3 cm) Weight: 195 lb (88.451 kg) IBW/kg (Calculated) : 70.7  Vital Signs: Temp: 98.3 F (36.8 C) (07/10 1108) Temp src: Oral (07/10 1108) BP: 104/41 mmHg (07/10 1108) Pulse Rate: 104 (07/10 0936) Intake/Output from previous day: 07/09 0701 - 07/10 0700 In: 7793 [P.O.:1262; I.V.:3] Out: 3700 [Urine:3700]  Labs:  Recent Labs  12/13/13 0050 12/14/13 0325 12/15/13 0605  WBC 3.1* 3.3* 3.2*  HGB 9.9* 9.5* 9.7*  PLT 72* 84* 77*  CREATININE 0.83 0.70 0.69   Estimated Creatinine Clearance: 137.8 ml/min (by C-G formula based on Cr of 0.69).  Microbiology: Recent Results (from the past 720 hour(s))  CULTURE, BLOOD (ROUTINE X 2)     Status: None   Collection Time    11/17/13  1:00 PM      Result Value Ref Range Status   Specimen Description BLOOD LEFT ANTECUBITAL   Final   Special Requests BOTTLES DRAWN AEROBIC AND ANAEROBIC 10CC   Final   Culture  Setup Time     Final   Value: 11/17/2013 16:48     Performed at [facility]   Culture     Final   Value: NO GROWTH 5 DAYS     Performed at [facility]   Report Status 11/23/2013 FINAL   Final  CULTURE, BLOOD (ROUTINE X 2)     Status: None   Collection Time    11/17/13  1:10 PM      Result Value Ref Range Status   Specimen Description BLOOD RIGHT HAND   Final   Special Requests BOTTLES DRAWN AEROBIC ONLY 2CC   Final   Culture  Setup Time     Final   Value: 11/17/2013 16:48     Performed at [facility]   Culture     Final   Value: NO GROWTH 5 DAYS     Performed at [facility]   Report Status 11/23/2013 FINAL   Final  AFB CULTURE WITH SMEAR     Status: None   Collection Time   12/11/13 12:44 PM      Result Value Ref Range Status   Specimen Description WOUND   Final   Special Requests LUMBAR 4 AND 5 DISC   Final   Acid Fast Smear     Final   Value: NO ACID FAST BACILLI SEEN     Performed at [facility]   Culture     Final   Value: CULTURE WILL BE EXAMINED FOR 6 WEEKS BEFORE ISSUING A FINAL REPORT     Performed at [facility]   Report Status PENDING   Incomplete  FUNGUS CULTURE W SMEAR     Status: None   Collection Time    12/11/13 12:44 PM      Result Value Ref Range Status   Specimen Description WOUND   Final   Special Requests LUMBAR 4 AND 5 DISC   Final   Fungal Smear     Final   Value: NO YEAST OR FUNGAL ELEMENTS SEEN     Performed at [facility]   Culture     Final   Value: CULTURE IN PROGRESS FOR FOUR WEEKS     Performed  at [facility]   Report Status PENDING   Incomplete  CULTURE, ROUTINE-ABSCESS     Status: None   Collection Time    12/11/13 12:44 PM      Result Value Ref Range Status   Specimen Description ABSCESS BACK   Final   Special Requests L4 L5 DISC ASPIRATE   Final   Gram Stain     Final   Value: NO WBC SEEN     NO SQUAMOUS EPITHELIAL CELLS SEEN     NO ORGANISMS SEEN     Performed at [facility]   Culture     Final   Value: NO GROWTH 2 DAYS     Performed at [facility]   Report Status PENDING   Incomplete  URINE CULTURE     Status: None   Collection Time    12/11/13  5:14 PM      Result Value Ref Range Status   Specimen Description URINE, CLEAN CATCH   Final   Special Requests NONE   Final   Culture  Setup Time     Final   Value: 12/11/2013 18:43     Performed at [place]     Final   Value: NO GROWTH     Performed at [facility]   Culture     Final   Value: NO GROWTH     Performed at [facility]   Report Status 12/12/2013 FINAL   Final  CULTURE, BLOOD (ROUTINE X 2)     Status: None   Collection Time    12/11/13  7:20 PM       Result Value Ref Range Status   Specimen Description BLOOD RIGHT HAND   Final   Special Requests BOTTLES DRAWN AEROBIC ONLY 6CC   Final   Culture  Setup Time     Final   Value: 12/12/2013 00:23     Performed at [facility]   Culture     Final   Value:        BLOOD CULTURE RECEIVED NO GROWTH TO DATE CULTURE WILL BE HELD FOR 5 DAYS BEFORE ISSUING A FINAL NEGATIVE REPORT     Performed at [facility]   Report Status PENDING   Incomplete  CULTURE, BLOOD (ROUTINE X 2)     Status: None   Collection Time    12/11/13  7:30 PM      Result Value Ref Range Status   Specimen Description BLOOD LEFT HAND   Final   Special Requests BOTTLES DRAWN AEROBIC ONLY 3.5CC   Final   Culture  Setup Time     Final   Value: 12/12/2013 00:23     Performed at [facility]   Culture     Final   Value:        BLOOD CULTURE RECEIVED NO GROWTH TO DATE CULTURE WILL BE HELD FOR 5 DAYS BEFORE ISSUING A FINAL NEGATIVE REPORT     Performed at [facility]   Report Status PENDING   Incomplete   Medical History: Past Medical History  Diagnosis Date  . Cancer   . Lymphoma   . HIV disease   . Cirrhosis   . PTSD (post-traumatic stress disorder)   . Depressive disorder    Medications:  Anti-infectives   Start     Dose/Rate Route Frequency Ordered Stop   12/15/13 1300  DAPTOmycin (CUBICIN) 700 mg in sodium chloride 0.9 %  IVPB     700 mg 228 mL/hr over 30 Minutes Intravenous Every 24 hours 12/15/13 1226     12/15/13 1000  azithromycin (ZITHROMAX) tablet 1,200 mg     1,200 mg Oral Every Fri 12/10/13 2050     12/11/13 1000  atovaquone (MEPRON) 750 MG/5ML suspension 1,500 mg  Status:  Discontinued     1,500 mg Oral Daily 12/10/13 2050 12/14/13 0953   12/11/13 0800  Darunavir Ethanolate (PREZISTA) tablet 800 mg     800 mg Oral Daily with breakfast 12/10/13 2050     12/11/13 0800  ritonavir (NORVIR) capsule 100 mg     100 mg Oral Daily with breakfast 12/10/13 2050     12/10/13  2200  emtricitabine-tenofovir (TRUVADA) 200-300 MG per tablet 1 tablet     1 tablet Oral Daily at bedtime 12/10/13 2050       Assessment: 38 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  He has had multiple infections including VRE bacteremia as well as enterococcus.  He also has had candidemia.  He has had an MRI and now with vertebral osteomyelitis and discitis.  We have been asked to dose his Daptomycin and monitor his response.  Currently his creatinine is 0.7 with an estimated crcl of 147ml/min.  His WBC is 3.2K and at his baseline.  He has some thrombocytopenia and slight elevation of INR  Goal of Therapy:  8mg /kg per ID request  Plan:  1.  Will begin Daptomycin at 700mg  daily 2.  Monitor renal function and electrolytes 3.  F/u for clinical response  Rober Minion, PharmD., MS Clinical Pharmacist Pager:  [phone] Thank you for allowing pharmacy to be part of this patients care team. 12/15/2013,12:28 PM

## 2013-12-15 NOTE — Progress Notes (Signed)
[facility]    Subjective: Was angry about fluid restrictions and wanted a different hospital is. I talked to him extensively and he is agreeable to staying in the hospital after an apparent left AGAINST MEDICAL ADVICE  Antibiotics:  Anti-infectives   Start     Dose/Rate Route Frequency Ordered Stop   12/15/13 1300  DAPTOmycin (CUBICIN) 700 mg in sodium chloride 0.9 % IVPB     700 mg 228 mL/hr over 30 Minutes Intravenous Every 24 hours 12/15/13 1226     12/15/13 1000  azithromycin (ZITHROMAX) tablet 1,200 mg     1,200 mg Oral Every Fri 12/10/13 2050     12/11/13 1000  atovaquone (MEPRON) 750 MG/5ML suspension 1,500 mg  Status:  Discontinued     1,500 mg Oral Daily 12/10/13 2050 12/14/13 0953   12/11/13 0800  Darunavir Ethanolate (PREZISTA) tablet 800 mg     800 mg Oral Daily with breakfast 12/10/13 2050     12/11/13 0800  ritonavir (NORVIR) capsule 100 mg     100 mg Oral Daily with breakfast 12/10/13 2050     12/10/13 2200  emtricitabine-tenofovir (TRUVADA) 200-300 MG per tablet 1 tablet     1 tablet Oral Daily at bedtime 12/10/13 2050        Medications: Scheduled Meds: . azithromycin  1,200 mg Oral Q Fri  . colchicine  0.6 mg Oral Daily  . cyclobenzaprine  5 mg Oral QHS  . DAPTOmycin (CUBICIN)  IV  700 mg Intravenous Q24H  . Darunavir Ethanolate  800 mg Oral Q breakfast  . docusate sodium  100 mg Oral BID  . emtricitabine-tenofovir  1 tablet Oral QHS  . feeding supplement (RESOURCE BREEZE)  1 Container Oral BID PC  . fentaNYL      . furosemide  40 mg Oral BID  . gabapentin  300 mg Oral TID  . lactose free nutrition  237 mL Oral BID  . midazolam      . morphine  15 mg Oral BID  . pantoprazole  40 mg Oral BID  . phytonadione  10 mg Oral Daily  . ritonavir  100 mg Oral Q breakfast  . sodium chloride  3 mL Intravenous Q12H  . sucralfate  1 g Oral TID WC   Continuous Infusions:  PRN Meds:.acetaminophen, HYDROmorphone (DILAUDID) injection,  ketorolac, ondansetron (ZOFRAN) IV    Objective: Weight change:   Intake/Output Summary (Last 24 hours) at 12/15/13 1746 Last data filed at 12/15/13 1500  Gross per 24 hour  Intake   1190 ml  Output   3050 ml  Net  -1860 ml   Blood pressure 106/55, pulse 100, temperature 98.4 F (36.9 C), temperature source Oral, resp. rate 16, height 5\' 9"  (1.753 m), weight 195 lb (88.451 kg), SpO2 100.00%. Temp:  [98.3 F (36.8 C)-98.4 F (36.9 C)] 98.4 F (36.9 C) (07/10 1432) Pulse Rate:  [92-109] 100 (07/10 1432) Resp:  [12-18] 16 (07/10 1432) BP: (88-106)/(39-60) 106/55 mmHg (07/10 1432) SpO2:  [96 %-100 %] 100 % (07/10 1432)  Physical Exam: General: Alert and awake, oriented x3, pale coarse voice and clearly in pain  HEENT: anicteric sclera, pupils reactive to light and accommodation, EOMI, oropharynx clear and without exudate  CVS regular rate, normal r, no murmur rubs or gallops  Chest: clear to auscultation bilaterally, no wheezing, rales or rhonchi  Abdomen: soft nontender, nondistended, normal bowel sounds,  Extremities: tenderness in big toe Neuro: No focal deficits but has severe  pain with trying to raise his right leg in particular  CBC:  Recent Labs Lab 12/10/13 2050 12/11/13 0500 12/11/13 1035 12/12/13 0800 12/13/13 0050 12/14/13 0325 12/15/13 0605  HGB  --  11.3*  --  10.8* 9.9* 9.5* 9.7*  HCT  --  31.6*  --  30.2* 27.7* 26.8* 27.2*  PLT  --  119*  --  109* 72* 84* 77*  INR 1.49  --  1.53*  --   --   --  1.50*     BMET  Recent Labs  12/14/13 0325 12/15/13 0605  NA 129* 131*  K 5.0 5.0  CL 96 97  CO2 23 23  GLUCOSE 77 79  BUN 13 14  CREATININE 0.70 0.69  CALCIUM 8.7 9.0     Liver Panel   Recent Labs  12/14/13 0325  PROT 7.3  ALBUMIN 2.4*  AST 53*  ALT 53  ALKPHOS 115  BILITOT 2.4*       Sedimentation Rate No results found for this basename: ESRSEDRATE,  in the last 72 hours C-Reactive Protein No results found for this basename:  CRP,  in the last 72 hours  Micro Results: Recent Results (from the past 720 hour(s))  CULTURE, BLOOD (ROUTINE X 2)     Status: None   Collection Time    11/17/13  1:00 PM      Result Value Ref Range Status   Specimen Description BLOOD LEFT ANTECUBITAL   Final   Special Requests BOTTLES DRAWN AEROBIC AND ANAEROBIC 10CC   Final   Culture  Setup Time     Final   Value: 11/17/2013 16:48     Performed at [facility]   Culture     Final   Value: NO GROWTH 5 DAYS     Performed at [facility]   Report Status 11/23/2013 FINAL   Final  CULTURE, BLOOD (ROUTINE X 2)     Status: None   Collection Time    11/17/13  1:10 PM      Result Value Ref Range Status   Specimen Description BLOOD RIGHT HAND   Final   Special Requests BOTTLES DRAWN AEROBIC ONLY 2CC   Final   Culture  Setup Time     Final   Value: 11/17/2013 16:48     Performed at [facility]   Culture     Final   Value: NO GROWTH 5 DAYS     Performed at [facility]   Report Status 11/23/2013 FINAL   Final  AFB CULTURE WITH SMEAR     Status: None   Collection Time    12/11/13 12:44 PM      Result Value Ref Range Status   Specimen Description WOUND   Final   Special Requests LUMBAR 4 AND 5 DISC   Final   Acid Fast Smear     Final   Value: NO ACID FAST BACILLI SEEN     Performed at [facility]   Culture     Final   Value: CULTURE WILL BE EXAMINED FOR 6 WEEKS BEFORE ISSUING A FINAL REPORT     Performed at [facility]   Report Status PENDING   Incomplete  FUNGUS CULTURE W SMEAR     Status: None   Collection Time    12/11/13 12:44 PM      Result Value Ref Range Status   Specimen Description WOUND   Final   Special Requests LUMBAR 4 AND  5 DISC   Final   Fungal Smear     Final   Value: NO YEAST OR FUNGAL ELEMENTS SEEN     Performed at [facility]   Culture     Final   Value: CULTURE IN PROGRESS FOR FOUR WEEKS     Performed at [facility]   Report Status  PENDING   Incomplete  CULTURE, ROUTINE-ABSCESS     Status: None   Collection Time    12/11/13 12:44 PM      Result Value Ref Range Status   Specimen Description ABSCESS BACK   Final   Special Requests L4 L5 DISC ASPIRATE   Final   Gram Stain     Final   Value: NO WBC SEEN     NO SQUAMOUS EPITHELIAL CELLS SEEN     NO ORGANISMS SEEN     Performed at [facility]   Culture     Final   Value: NO GROWTH 3 DAYS     Performed at [facility]   Report Status 12/15/2013 FINAL   Final  URINE CULTURE     Status: None   Collection Time    12/11/13  5:14 PM      Result Value Ref Range Status   Specimen Description URINE, CLEAN CATCH   Final   Special Requests NONE   Final   Culture  Setup Time     Final   Value: 12/11/2013 18:43     Performed at [place]     Final   Value: NO GROWTH     Performed at [facility]   Culture     Final   Value: NO GROWTH     Performed at [facility]   Report Status 12/12/2013 FINAL   Final  CULTURE, BLOOD (ROUTINE X 2)     Status: None   Collection Time    12/11/13  7:20 PM      Result Value Ref Range Status   Specimen Description BLOOD RIGHT HAND   Final   Special Requests BOTTLES DRAWN AEROBIC ONLY 6CC   Final   Culture  Setup Time     Final   Value: 12/12/2013 00:23     Performed at [facility]   Culture     Final   Value:        BLOOD CULTURE RECEIVED NO GROWTH TO DATE CULTURE WILL BE HELD FOR 5 DAYS BEFORE ISSUING A FINAL NEGATIVE REPORT     Performed at [facility]   Report Status PENDING   Incomplete  CULTURE, BLOOD (ROUTINE X 2)     Status: None   Collection Time    12/11/13  7:30 PM      Result Value Ref Range Status   Specimen Description BLOOD LEFT HAND   Final   Special Requests BOTTLES DRAWN AEROBIC ONLY 3.5CC   Final   Culture  Setup Time     Final   Value: 12/12/2013 00:23     Performed at [facility]   Culture     Final   Value:        BLOOD  CULTURE RECEIVED NO GROWTH TO DATE CULTURE WILL BE HELD FOR 5 DAYS BEFORE ISSUING A FINAL NEGATIVE REPORT     Performed at [facility]   Report Status PENDING   Incomplete    Studies/Results: Mr Foot Left W Wo Contrast  12/14/2013  CLINICAL DATA:  Osteomyelitis.  Bacteremia.  Great toe pain.  EXAM: MRI OF THE LEFT FOREFOOT WITHOUT AND WITH CONTRAST  TECHNIQUE: Multiplanar, multisequence MR imaging was performed both before and after administration of intravenous contrast.  CONTRAST:  59mL MULTIHANCE GADOBENATE DIMEGLUMINE 529 MG/ML IV SOLN  COMPARISON:  12/13/2013.  FINDINGS: There is no osteomyelitis. No effacement of normal fatty marrow or erosive changes. No soft tissue abscess. Flexor and extensor tendons appear within normal limits. Mild subcutaneous edema is present over the lateral dorsum of the foot, which may represent dependent edema or cellulitis in the appropriate clinical setting.  Although only partially visualized, there appears to be midfoot enhancement suspicious for small marginal erosions. This can be associated with gout arthropathy and correlation with serum uric acid is recommended. Amyloid in a patient with renal failure could produce a similar appearance.  There is no first MTP joint osteoarthritis or erosive change. No joint effusion in the forefoot.  IMPRESSION: 1. Negative for osteomyelitis. 2. Mild dorsal forefoot edema likely representing cellulitis or dependent edema. 3. Inflammatory changes in enhancement in the midfoot, most commonly associated with gout arthritis. Consider correlation with serum uric acid.   Electronically Signed   By: Dereck Ligas M.D.   On: 12/14/2013 16:54   Ir Fluoro Guide Ndl Plmt / Bx  12/15/2013   CLINICAL DATA:  Low back pain.  L3-L4 discitis/osteomyelitis.  EXAM: IR FLUORO GUIDE NEEDLE PLACEMENT /BIOPSY  ANESTHESIA/SEDATION: Conscious sedation.  MEDICATIONS: Versed 1 mg IV.  Fentanyl 75 mcg IV.  CONTRAST:  None.  PROCEDURE: Following  full explanation of the procedure along with the potential associated complications, an informed witnessed consent was obtained.  Patient was laid prone on the fluoroscopic table. The skin overlying the lumbar region was then prepped and draped in the usual sterile fashion. The skin entry site in the right parasagittal region at L3-L4 was then infiltrated with 0.25% Bupivacaine and carried into the paraspinous soft tissues.  Using biplane intermittent fluoroscopy, a 20 gauge Francine biopsy needle was then advanced into the L3-L4 disc space without difficulty. Using a 20 mL syringe, bloody aspirate was obtained and sent for microbiologic analysis. A second pass was similarly made with the new Francine biopsy needle in a different location at L3-L4. Again using a 20 mL syringe, bloody aspirate was obtained and sent for microbiologic analysis. The needle was removed. Hemostasis was achieved at the skin entry site. Patient tolerated the procedure well.  COMPLICATIONS: None immediate.  FINDINGS: As above.  IMPRESSION: Status post fluoroscopic-guided needle placement for disc aspiration at L3-L4 as described above without event.   Electronically Signed   By: Luanne Bras M.D.   On: 12/15/2013 12:00   Dg Foot Complete Left  12/13/2013   CLINICAL DATA:  Left foot pain.  EXAM: LEFT FOOT - COMPLETE 3+ VIEW  COMPARISON:  None.  FINDINGS: There are slight arthritic changes of the first metatarsal phalangeal joint. The bones are otherwise normal other than a tiny plantar calcaneal spur.  IMPRESSION: Minimal degenerative changes of the first metatarsal phalangeal joint.   Electronically Signed   By: Rozetta Nunnery M.D.   On: 12/13/2013 19:09      Assessment/Plan:  Active Problems:   AIDS   Large cell lymphoma   Cirrhosis, non-alcoholic   Protein-calorie malnutrition, severe   Discitis   Malnutrition of moderate degree    Samuel Schultz is a 38 y.o. 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 He got a dose of vancomycin and another antibiotic he thinks at [facility] unfortunately prior to transfer here. He's undergone I are guided biopsy cx negative to date  He has toe pain and now plain films c/w degnerative disease. Dr. Sharol Given has seen pt and believes he may have gout vs pseudogout.MRI suggestive of gout.  #1 osteomyelitis with vertebral infection and discitis:   --Culture the been taken a second time and we have started daptomycin   --I am most suspicious for AMP sensitive ENterococcus and think that the VRE and Candida Could have been contaminants that we saw before (though his family do not want to risk of not treating them.)   #2 Toe pain: fine to continue colchicine   #2 HIV/AIDS: continue as Prezista Norvir Truvada, will resume prophylactic antibiotics after we have nailed down what is going on in his vertebral osteomyelitis. Hold off his mepron as well pending biopsy tomorrow.        LOS: 5 days   Alcide Evener 12/15/2013, 5:46 PM

## 2013-12-15 NOTE — Progress Notes (Addendum)
Patient Demographics  Samuel Schultz, is a 38 y.o. male, DOB - 21-Jul-1975, WPY:[id number]  Admit date - 12/10/2013   Admitting Physician Samuel Polite, MD  Outpatient Primary MD for the patient is Samuel Liv, MD  LOS - 5   No chief complaint on file.       HPI:  38 y.o. male is here because of history of diffuse large B cell lymphoma, stage IV disease and was treated with R. 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 He was continued on Cubicin and fluconazole through 6/24, 14 days after his last negative culture.   During the same admission he also complained of right-sided lower back pain and leg pain, MRI was found to be negative for discitis . He was started on Flexeril and MS Contin for back pain. He has been progressively getting weaker, has had chills,rigors,, no documented fever at the nursing home. The patient is unable to ambulate because of weakness and pain in his back.   He had an MRI done at [facility] ED that showed new/progressive L3-L4  discitis/osteomyelitis but no abscess. He also had WBC count of 6.8, a sodium of 121.    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        Subjective:   Samuel Schultz today has, No headache, No chest pain, No abdominal pain - No Nausea, No new weakness tingling or numbness, No Cough - SOB. + Low back pain. Improved first toe pain.   Note patient extremely agitated in the morning of 12/15/2013 and threatening to leave AMA, he also wants to Samuel Schultz the hospital for suboptimal care, unhappy with fluid restriction despite being explained that  this was being done due to SIADH. Of note his hyponatremia is much improved with fluid restriction. However she says is being treated as a child and Schultz water through a sippy cup, he was told that this was being done to monitor fluid intake closely and this is helping his health.   He has been adequately warned that if he leaves AMA appropriate care which includes antibiotics cannot be provided which can result in sepsis, paraplegia, death, stroke. He assumes all responsibility for leaving AMA. We'll counsel him further and try to keep him in the hospital for PICC line placement and antibiotics the   Assessment & Plan     1. Back Pain - per MRI done at [facility] ER on 12/10/2013 patient has discitis/os to mellitus without the evidence of frank abscess at L3-L4. Case was discussed with ID who are on board, currently planned to hold antibiotics, post CT-guided needle aspiration on 12/11/2013 by IR for targeted culture and sensitivity which were unremarkable, being followed by ID, Per ID got repeat IR guided aspiration on 12/15/2013, discussed the case with Samuel Schultz likely will be started on antibiotics with a PICC line placement on 12/15/2013.  Pt requesting Toradol for pain, will order 15mg  IV q8 PRN x 2  days .    2. AIDS - is to be followed by ID in the outpatient setting had a pending appointment. Continue outpatient medications for now. ID to follow.   On azithromycin for Mycobacterium avium prophylaxis, currently not on Bactrim. ID following.     3. History of advanced Large cell lymphoma with pancytopenia (Cirrhosis) - being followed by Dr. Alvy Schultz and Samuel Schultz in the outpatient setting.      4. 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     5. Depression on Lexapro.     6. Hyponatremia due to SIADH. Much improved on Lasix along with fluid restriction, continue.    7. Mild hyperkalemia. Stopped Aldactone, gentle Lasix. Post Kayexalate 1 dose. Now upper limits of normal. Monitor closely.    8. Left great toe pain. Likely mild gout, seen by orthopedic physician Samuel Schultz, uric acid level stable. X-ray stable. Placed on prednisone and colchicine by orthopedics. Monitor clinically. Pain is improved.      Code Status: full  Family Communication: none present  Disposition Plan: SNF   Procedures   MRI [place] [facility] spine - diskitis  L-spine aspiration by IR on 12/10/2013 and 12/15/2013.  PICC ordered on 12/15/2013   Consults  IR, ID, orthopedics Samuel Schultz   Medications  Scheduled Meds: . azithromycin  1,200 mg Oral Q Fri  . colchicine  0.6 mg Oral Daily  . cyclobenzaprine  5 mg Oral QHS  . Darunavir Ethanolate  800 mg Oral Q breakfast  . docusate sodium  100 mg Oral BID  . emtricitabine-tenofovir  1 tablet Oral QHS  . feeding supplement (RESOURCE BREEZE)  1 Container Oral BID PC  . fentaNYL      . furosemide  40 mg Oral BID  . gabapentin  300 mg Oral TID  . lactose free nutrition  237 mL Oral BID  . midazolam      .  morphine  15 mg Oral BID  . pantoprazole  40 mg Oral BID  . phytonadione  10 mg Oral Daily  . ritonavir  100  mg Oral Q breakfast  . sodium chloride  3 mL Intravenous Q12H  . sucralfate  1 g Oral TID WC   Continuous Infusions:   PRN Meds:.acetaminophen, HYDROmorphone (DILAUDID) injection, ondansetron (ZOFRAN) IV  DVT Prophylaxis  SCDs    Lab Results  Component Value Date   PLT 77* 12/15/2013    Antibiotics    Anti-infectives   Start     Dose/Rate Route Frequency Ordered Stop   12/15/13 1000  azithromycin (ZITHROMAX) tablet 1,200 mg     1,200 mg Oral Every Fri 12/10/13 2050     12/11/13 1000  atovaquone (MEPRON) 750 MG/5ML suspension 1,500 mg  Status:  Discontinued     1,500 mg Oral Daily 12/10/13 2050 12/14/13 0953   12/11/13 0800  Darunavir Ethanolate (PREZISTA) tablet 800 mg     800 mg Oral Daily with breakfast 12/10/13 2050     12/11/13 0800  ritonavir (NORVIR) capsule 100 mg     100 mg Oral Daily with breakfast 12/10/13 2050     12/10/13 2200  emtricitabine-tenofovir (TRUVADA) 200-300 MG per tablet 1 tablet     1 tablet Oral Daily at bedtime 12/10/13 2050            Objective:   Filed Vitals:   12/15/13 0924 12/15/13 0930 12/15/13 0936 12/15/13 1108  BP: 100/55 91/45 102/54 104/41  Pulse: 109 109 104   Temp:    98.3 F (36.8 C)  TempSrc:    Oral  Resp: 16 15 12    Height:      Weight:      SpO2: 100% 100% 100% 98%    Wt Readings from Last 3 Encounters:  12/10/13 88.451 kg (195 lb)  12/05/13 87.68 kg (193 lb 4.8 oz)  11/23/13 99.111 kg (218 lb 8 oz)     Intake/Output Summary (Last 24 hours) at 12/15/13 1132 Last data filed at 12/15/13 0609  Gross per 24 hour  Intake   1262 ml  Output   3700 ml  Net  -2438 ml     Physical Exam  Awake Alert, Oriented X 3, No new F.N deficits, dramatic affect [place].AT,PERRAL Supple Neck,No JVD, No cervical lymphadenopathy appriciated.  Symmetrical Chest wall movement, Good air movement bilaterally, CTAB RRR,No  Gallops,Rubs or new Murmurs, No Parasternal Heave +ve B.Sounds, Abd Soft, No tenderness, No organomegaly appriciated, No rebound - guarding or rigidity. No Cyanosis, Clubbing or edema, No new Rash or bruise , L 1st toe mildly tender, loss of first toe nail is chronic, no erythema. No warmth.     Data Review   Micro Results Recent Results (from the past 240 hour(s))  AFB CULTURE WITH SMEAR     Status: None   Collection Time    12/11/13 12:44 PM      Result Value Ref Range Status   Specimen Description WOUND   Final   Special Requests LUMBAR 4 AND 5 DISC   Final   Acid Fast Smear     Final   Value: NO ACID FAST BACILLI SEEN     Performed at [facility]   Culture     Final   Value: CULTURE WILL BE EXAMINED FOR 6 WEEKS BEFORE ISSUING A FINAL REPORT     Performed at [facility]   Report Status PENDING   Incomplete  FUNGUS CULTURE W SMEAR     Status: None   Collection Time    12/11/13 12:44 PM      Result Value Ref Range  Status   Specimen Description WOUND   Final   Special Requests LUMBAR 4 AND 5 DISC   Final   Fungal Smear     Final   Value: NO YEAST OR FUNGAL ELEMENTS SEEN     Performed at [facility]   Culture     Final   Value: CULTURE IN PROGRESS FOR FOUR WEEKS     Performed at [facility]   Report Status PENDING   Incomplete  CULTURE, ROUTINE-ABSCESS     Status: None   Collection Time    12/11/13 12:44 PM      Result Value Ref Range Status   Specimen Description ABSCESS BACK   Final   Special Requests L4 L5 DISC ASPIRATE   Final   Gram Stain     Final   Value: NO WBC SEEN     NO SQUAMOUS EPITHELIAL CELLS SEEN     NO ORGANISMS SEEN     Performed at [facility]   Culture     Final   Value: NO GROWTH 2 DAYS     Performed at [facility]   Report Status PENDING   Incomplete  URINE CULTURE     Status: None   Collection Time    12/11/13  5:14 PM      Result Value Ref Range Status   Specimen Description URINE, CLEAN  CATCH   Final   Special Requests NONE   Final   Culture  Setup Time     Final   Value: 12/11/2013 18:43     Performed at [facility]     Final   Value: NO GROWTH     Performed at [facility]   Culture     Final   Value: NO GROWTH     Performed at [facility]   Report Status 12/12/2013 FINAL   Final  CULTURE, BLOOD (ROUTINE X 2)     Status: None   Collection Time    12/11/13  7:20 PM      Result Value Ref Range Status   Specimen Description BLOOD RIGHT HAND   Final   Special Requests BOTTLES DRAWN AEROBIC ONLY 6CC   Final   Culture  Setup Time     Final   Value: 12/12/2013 00:23     Performed at [facility]   Culture     Final   Value:        BLOOD CULTURE RECEIVED NO GROWTH TO DATE CULTURE WILL BE HELD FOR 5 DAYS BEFORE ISSUING A FINAL NEGATIVE REPORT     Performed at [facility]   Report Status PENDING   Incomplete  CULTURE, BLOOD (ROUTINE X 2)     Status: None   Collection Time    12/11/13  7:30 PM      Result Value Ref Range Status   Specimen Description BLOOD LEFT HAND   Final   Special Requests BOTTLES DRAWN AEROBIC ONLY 3.5CC   Final   Culture  Setup Time     Final   Value: 12/12/2013 00:23     Performed at [facility]   Culture     Final   Value:        BLOOD CULTURE RECEIVED NO GROWTH TO DATE CULTURE WILL BE HELD FOR 5 DAYS BEFORE ISSUING A FINAL NEGATIVE REPORT     Performed at [facility]   Report Status PENDING  Incomplete    Radiology Reports Dg Abd 1 View  11/12/2013   CLINICAL DATA:  Colitis.  EXAM: ABDOMEN - 1 VIEW  COMPARISON:  CT of the abdomen and pelvis 11/10/2013.  FINDINGS: Oral contrast material is noted throughout the colon and distal rectum. No pathologic distention of small bowel. No pneumoperitoneum. Multiple surgical clips project over the right upper quadrant of the abdomen. Splenic contour appears enlarged.  IMPRESSION: 1. Nonobstructive bowel gas pattern. 2. No  pneumoperitoneum. 3. Splenomegaly.   Electronically Signed   By: Vinnie Langton M.D.   On: 11/12/2013 11:47   Mr Lumbar Spine W Wo Contrast  11/15/2013   CLINICAL DATA:  38 year old male with bacteremia and new back pain. Severe low back pain radiating to the right lower extremity. Initial encounter. HIV, cirrhosis, lymphoma.  EXAM: MRI LUMBAR SPINE WITHOUT AND WITH CONTRAST  TECHNIQUE: Multiplanar and multiecho pulse sequences of the lumbar spine were obtained without and with intravenous contrast.  CONTRAST:  60mL MULTIHANCE GADOBENATE DIMEGLUMINE 529 MG/ML IV SOLN  COMPARISON:  CT Abdomen and Pelvis 11/10/2013 and earlier.  FINDINGS: Normal lumbar segmentation depicted on comparison.  Diffuse loss of normal bone marrow signal in the visible spine, and left iliac bone. However, no marrow edema or destructive osseous lesion identified. No abnormal vertebral enhancement in the spine.  Visualized lower thoracic spinal cord is normal with conus medularis at L1. Normal cauda equina nerve roots. No abnormal intradural enhancement.  There is mild abnormal STIR and T2 signal in the lower right erector spinae muscles, at the L4 and L5 level (series 5, image 1 and series 6, image 26). This is nonspecific. No definite abnormal enhancement, and other visible paraspinal muscles appear normal. There is mild subcutaneous edema in the lumbar spine, significance doubtful in this setting.  Much of the abdominal viscera are obscured on these images.  T11-T12: Grossly normal.  T12-L1:  Negative.  L1-L2:  Negative.  L2-L3:  Negative.  L3-L4: Mild disc space loss and disc desiccation. Mild circumferential disc osteophyte complex. Mild facet hypertrophy. No significant stenosis.  L4-L5:  Negative except for mild facet hypertrophy.  L5-S1: Disc space loss with disc desiccation and right eccentric circumferential disc osteophyte complex. Focal central component (series 6, image 31), but no spinal or definite lateral recess stenosis.  No foraminal stenosis.  IMPRESSION: 1. Diffuse loss of normal bone marrow signal, but may be post treatment affect rather than due to widespread osseous metastatic disease. No destructive osseous lesion identified. 2. No evidence of lumbar discitis osteomyelitis. Mild to moderate chronic appearing disc degeneration at L3-L4 and L5-S1. No spinal stenosis. 3. Mild signal abnormality in the right lower lumbar erector spinae musculature, nonspecific. Early infectious myositis is not excluded in this setting.   Electronically Signed   By: Lars Pinks M.D.   On: 11/15/2013 20:30   Ir Removal [employer] Access W/ Port W/o Fl Mod Sed  11/16/2013   CLINICAL DATA:  History of bacteremia, concern for seeding of patient's chronic Port a Catheter.  EXAM: REMOVAL OF IMPLANTED TUNNELED PORT-A-CATH  MEDICATIONS: The patient is currently admitted to the hospital and receiving intravenous antibiotics; The antibiotic was administered within 1 hour prior to the start of the procedure.  ANESTHESIA/SEDATION: Intravenous Versed, Fentanyl and Dilaudid was administered for conscious sedation  Sedation time: 20  FLUOROSCOPY TIME:  None  PROCEDURE: Informed written consent was obtained from the patient after a discussion of the risk, benefits and alternatives to the procedure. The patient was positioned supine on  the fluoroscopy table and the right chest Port-A-Cath site was prepped with chlorhexidine. A sterile gown and gloves were worn during the procedure. Local anesthesia was provided with 1% lidocaine with epinephrine. A timeout was performed prior to the initiation of the procedure.  An incision was made overlying the Port-A-Cath with a #15 scalpel. Utilizing sharp and blunt dissection, the Port-A-Cath was removed completely. The pocked was irrigated with sterile saline. Wound closure was performed with subcutaneous 3-0 Monocryl, subcuticular 4-0 Vicryl and Dermabond. A dressing was placed. The patient tolerated the procedure well without  immediate post procedural complication.  FINDINGS: Successful removal of implant Port-A-Cath without immediate post procedural complication.  Visual inspection of the Port a catheter pocket was negative for any definitive evidence of infection and as such, the incision was closed primarily with absorbable suture as above.  IMPRESSION: Successful removal of implanted Port-A-Cath.   Electronically Signed   By: Sandi Mariscal M.D.   On: 11/16/2013 16:57    CBC  Recent Labs Lab 12/11/13 0500 12/12/13 0800 12/13/13 0050 12/14/13 0325 12/15/13 0605  WBC 4.3 4.4 3.1* 3.3* 3.2*  HGB 11.3* 10.8* 9.9* 9.5* 9.7*  HCT 31.6* 30.2* 27.7* 26.8* 27.2*  PLT 119* 109* 72* 84* 77*  MCV 101.9* 104.1* 104.5* 103.1* 103.8*  MCH 36.5* 37.2* 37.4* 36.5* 37.0*  MCHC 35.8 35.8 35.7 35.4 35.7  RDW 15.7* 15.9* 15.9* 15.5 15.5    Chemistries   Recent Labs Lab 12/10/13 2050 12/11/13 0500 12/12/13 0800 12/13/13 0050 12/14/13 0325 12/15/13 0605  NA 122* 124* 125* 125* 129* 131*  K 4.5 4.5 5.4* 5.0 5.0 5.0  CL 88* 92* 93* 92* 96 97  CO2 22 23 23 24 23 23   GLUCOSE 103* 86 93 82 77 79  BUN 24* 21 17 17 13 14   CREATININE 0.86 0.90 0.89 0.83 0.70 0.69  CALCIUM 9.5 9.1 9.0 8.7 8.7 9.0  MG  --   --  1.8 1.5 1.8 1.7  AST 82* 79*  --   --  53*  --   ALT 67* 69*  --   --  53  --   ALKPHOS 123* 117  --   --  115  --   BILITOT 4.7* 4.3*  --   --  2.4*  --    ------------------------------------------------------------------------------------------------------------------ estimated creatinine clearance is 137.8 ml/min (by C-G formula based on Cr of 0.69). ------------------------------------------------------------------------------------------------------------------ No results found for this basename: HGBA1C,  in the last 72 hours ------------------------------------------------------------------------------------------------------------------ No results found for this basename: CHOL, HDL, LDLCALC, TRIG,  CHOLHDL, LDLDIRECT,  in the last 72 hours ------------------------------------------------------------------------------------------------------------------ No results found for this basename: TSH, T4TOTAL, FREET3, T3FREE, THYROIDAB,  in the last 72 hours ------------------------------------------------------------------------------------------------------------------ No results found for this basename: VITAMINB12, FOLATE, FERRITIN, TIBC, IRON, RETICCTPCT,  in the last 72 hours  Coagulation profile  Recent Labs Lab 12/10/13 2050 12/11/13 1035 12/15/13 0605  INR 1.49 1.53* 1.50*    No results found for this basename: DDIMER,  in the last 72 hours  Cardiac Enzymes No results found for this basename: CK, CKMB, TROPONINI, MYOGLOBIN,  in the last 168 hours ------------------------------------------------------------------------------------------------------------------ No components found with this basename: POCBNP,      Time Spent in minutes   35   Rontrell Moquin K M.D on 12/15/2013 at 11:32 AM  Between 7am to 7pm - Pager - [phone]  After 7pm go to [email or web address] - password [email or web address]  And look for the night coverage person covering for me after hours  [facility] Office  [phone]   **Disclaimer: This note may have been dictated with voice recognition software. Similar sounding words can inadvertently be transcribed and this note may contain transcription errors which may not have been corrected upon publication of note.**

## 2013-12-15 NOTE — Clinical Social Work Note (Signed)
CSW continuing to follow and assist with discharge planning needs. Pt has one bed offer from [facility].  Lubertha Sayres, MSW, [facility] Licensed Clinical Social Worker [phone] and [phone]

## 2013-12-15 NOTE — Progress Notes (Signed)
Patient ID: Samuel Schultz, male   DOB: 11/02/1975, 38 y.o.   MRN: [id number] Patient states that he had rapid relief of pain in his foot after starting the colchicine. He states the medicine seems to have worn off this morning. Would continue twice a day colchicine through his hospital stay and discharged on 1 tablet a day. I will followup after discharge.

## 2013-12-16 ENCOUNTER — Inpatient Hospital Stay (HOSPITAL_COMMUNITY): Payer: Medicaid Other

## 2013-12-16 DIAGNOSIS — K7460 Unspecified cirrhosis of liver: Secondary | ICD-10-CM

## 2013-12-16 DIAGNOSIS — B20 Human immunodeficiency virus [HIV] disease: Secondary | ICD-10-CM

## 2013-12-16 DIAGNOSIS — C8589 Other specified types of non-Hodgkin lymphoma, extranodal and solid organ sites: Secondary | ICD-10-CM

## 2013-12-16 LAB — CBC
HEMATOCRIT: 25.6 % — AB (ref 39.0–52.0)
HEMOGLOBIN: 8.9 g/dL — AB (ref 13.0–17.0)
MCH: 36.6 pg — ABNORMAL HIGH (ref 26.0–34.0)
MCHC: 34.8 g/dL (ref 30.0–36.0)
MCV: 105.3 fL — AB (ref 78.0–100.0)
Platelets: 63 10*3/uL — ABNORMAL LOW (ref 150–400)
RBC: 2.43 MIL/uL — ABNORMAL LOW (ref 4.22–5.81)
RDW: 15.7 % — AB (ref 11.5–15.5)
WBC: 2 10*3/uL — AB (ref 4.0–10.5)

## 2013-12-16 LAB — BASIC METABOLIC PANEL
Anion gap: 7 (ref 5–15)
BUN: 17 mg/dL (ref 6–23)
CHLORIDE: 98 meq/L (ref 96–112)
CO2: 25 meq/L (ref 19–32)
Calcium: 8.7 mg/dL (ref 8.4–10.5)
Creatinine, Ser: 0.93 mg/dL (ref 0.50–1.35)
GFR calc Af Amer: >90 mL/min (ref >90)
GFR calc non Af Amer: >90 mL/min (ref >90)
GLUCOSE: 85 mg/dL (ref 70–99)
POTASSIUM: 5.3 meq/L (ref 3.7–5.3)
Sodium: 130 mEq/L — ABNORMAL LOW (ref 137–147)

## 2013-12-16 LAB — MAGNESIUM: Magnesium: 1.7 mg/dL (ref 1.5–2.5)

## 2013-12-16 MED ORDER — SODIUM CHLORIDE 0.9 % IJ SOLN
10.0000 mL | INTRAMUSCULAR | Status: DC | PRN
  Administered 2013-12-18 – 2013-12-19 (×3): 10 mL

## 2013-12-16 NOTE — Progress Notes (Signed)
Chart reviewed.                                                                                                                                                                                                                                             Patient Demographics  Samuel Schultz, is a 38 y.o. male, DOB - Jul 05, 1975, GNO:[id number]  Admit date - 12/10/2013   Admitting Physician Domenic Polite, MD  Outpatient Primary MD for the patient is [name], MD  LOS - 6   No chief complaint on file.       HPI:  38 y.o. male is here because of history of diffuse large B cell lymphoma, stage IV disease and was treated with R. 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 He was continued on Cubicin and fluconazole through 6/24, 14 days after his last negative culture.   During the same admission he also complained of right-sided lower back pain and leg pain, MRI was found to be negative for discitis . He was started on Flexeril and MS Contin for back pain. He has been progressively getting weaker, has had chills,rigors,, no documented fever at the nursing home. The patient is unable to ambulate because of weakness and pain in his back.   He had an MRI done at [facility] ED that showed  new/progressive L3-L4 discitis/osteomyelitis but no abscess. He also had WBC count of 6.8, a sodium of 121.       Subjective:   No new complaints   Assessment & Plan   1.Discitis/osteomyelitis: first aspiration negative thus far. Repeat aspiration for bacterial cx 7/9 pending. PICC and abx per id  2. AIDS per ID  3. History of advanced Large cell lymphoma  4. 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 Depression on Lexapro.  6. Hyponatremia due to SIADH. Much improved on Lasix along with fluid restriction, continue.  7. Mild hyperkalemia. Stopped Aldactone, gentle Lasix. Post Kayexalate 1 dose. Now upper limits of normal. Monitor closely.  8. Gout: continue current  Code Status: full  Family Communication: none present  Disposition Plan: SNF   Procedures   MRI [place] L spine - diskitis  L-spine aspiration by IR on 12/10/2013 and 12/15/2013.  PICC 12/15/2013   Consults  IR, ID, orthopedics Dr. Sharol Given   Medications  Scheduled Meds: . azithromycin  1,200 mg Oral Q Fri  . colchicine  0.6 mg Oral Daily  . cyclobenzaprine  5 mg Oral QHS  . DAPTOmycin (CUBICIN)  IV  700 mg Intravenous Q24H  . Darunavir Ethanolate  800 mg Oral Q breakfast  . docusate sodium  100 mg Oral BID  . emtricitabine-tenofovir  1 tablet Oral QHS  . feeding supplement (RESOURCE BREEZE)  1 Container Oral BID PC  . furosemide  40 mg Oral BID  . gabapentin  300 mg Oral TID  . lactose free nutrition  237 mL Oral BID  . morphine  15 mg Oral BID  . pantoprazole  40 mg Oral BID  . phytonadione  10 mg Oral Daily  . ritonavir  100 mg Oral Q breakfast  . sodium chloride  3 mL Intravenous Q12H  . sucralfate  1 g Oral TID WC   Continuous Infusions:   PRN Meds:.acetaminophen, HYDROmorphone (DILAUDID) injection, ketorolac, ondansetron (ZOFRAN) IV, sodium chloride  DVT Prophylaxis  SCDs    Lab Results    Component Value Date   PLT 63* 12/16/2013    Antibiotics    Anti-infectives   Start     Dose/Rate Route Frequency Ordered Stop   12/15/13 1300  DAPTOmycin (CUBICIN) 700 mg in sodium chloride 0.9 % IVPB     700 mg 228 mL/hr over 30 Minutes Intravenous Every 24 hours 12/15/13 1226     12/15/13 1000  azithromycin (ZITHROMAX) tablet 1,200 mg     1,200 mg Oral Every Fri 12/10/13 2050     12/11/13 1000  atovaquone (MEPRON) 750 MG/5ML suspension 1,500 mg  Status:  Discontinued     1,500 mg Oral Daily 12/10/13 2050 12/14/13 0953   12/11/13 0800  Darunavir Ethanolate (PREZISTA) tablet 800 mg     800 mg Oral Daily with breakfast 12/10/13 2050     12/11/13 0800  ritonavir (NORVIR) capsule 100 mg     100 mg Oral Daily with breakfast 12/10/13 2050     12/10/13 2200  emtricitabine-tenofovir (TRUVADA) 200-300 MG per tablet 1 tablet     1 tablet Oral Daily at bedtime 12/10/13 2050            Objective:   Filed Vitals:   12/15/13 2229 12/16/13 0528 12/16/13 1445 12/16/13 2103  BP: 91/53 91/44 96/46  99/49  Pulse: 102 102 102 102  Temp: 97.7 F (36.5 C) 98 F (36.7 C) 98.5 F (36.9 C) 98.1 F (36.7 C)  TempSrc: Oral Oral Oral Oral  Resp: 20 18 18 18   Height:      Weight:      SpO2: 100% 100% 96% 99%    Wt Readings from Last 3 Encounters:  12/10/13 88.451 kg (195 lb)  12/05/13 87.68 kg (193 lb 4.8 oz)  11/23/13 99.111 kg (218 lb 8 oz)     Intake/Output Summary (Last 24 hours) at 12/16/13 2121 Last data filed  at 12/16/13 1721  Gross per 24 hour  Intake   2040 ml  Output   2675 ml  Net   -635 ml     Physical Exam  Gen: keeps eyes closed during most of interaction, but answers questions. Symmetrical Chest wall movement, Good air movement bilaterally, CTAB RRR,No Gallops,Rubs or new Murmurs, No Parasternal Heave +ve B.Sounds, Abd Soft, No tenderness, No organomegaly appriciated, No rebound - guarding or rigidity. No Cyanosis, Clubbing or edema, No new Rash or bruise , no  tenderness   Data Review   Micro Results Recent Results (from the past 240 hour(s))  AFB CULTURE WITH SMEAR     Status: None   Collection Time    12/11/13 12:44 PM      Result Value Ref Range Status   Specimen Description WOUND   Final   Special Requests LUMBAR 4 AND 5 DISC   Final   Acid Fast Smear     Final   Value: NO ACID FAST BACILLI SEEN     Performed at [facility]   Culture     Final   Value: CULTURE WILL BE EXAMINED FOR 6 WEEKS BEFORE ISSUING A FINAL REPORT     Performed at [facility]   Report Status PENDING   Incomplete  FUNGUS CULTURE W SMEAR     Status: None   Collection Time    12/11/13 12:44 PM      Result Value Ref Range Status   Specimen Description WOUND   Final   Special Requests LUMBAR 4 AND 5 DISC   Final   Fungal Smear     Final   Value: NO YEAST OR FUNGAL ELEMENTS SEEN     Performed at [facility]   Culture     Final   Value: CULTURE IN PROGRESS FOR FOUR WEEKS     Performed at [facility]   Report Status PENDING   Incomplete  CULTURE, ROUTINE-ABSCESS     Status: None   Collection Time    12/11/13 12:44 PM      Result Value Ref Range Status   Specimen Description ABSCESS BACK   Final   Special Requests L4 L5 DISC ASPIRATE   Final   Gram Stain     Final   Value: NO WBC SEEN     NO SQUAMOUS EPITHELIAL CELLS SEEN     NO ORGANISMS SEEN     Performed at [facility]   Culture     Final   Value: NO GROWTH 3 DAYS     Performed at [facility]   Report Status 12/15/2013 FINAL   Final  URINE CULTURE     Status: None   Collection Time    12/11/13  5:14 PM      Result Value Ref Range Status   Specimen Description URINE, CLEAN CATCH   Final   Special Requests NONE   Final   Culture  Setup Time     Final   Value: 12/11/2013 18:43     Performed at [facility]     Final   Value: NO GROWTH     Performed at [facility]   Culture     Final   Value: NO GROWTH      Performed at [facility]   Report Status 12/12/2013 FINAL   Final  CULTURE, BLOOD (ROUTINE X 2)     Status: None   Collection  Time    12/11/13  7:20 PM      Result Value Ref Range Status   Specimen Description BLOOD RIGHT HAND   Final   Special Requests BOTTLES DRAWN AEROBIC ONLY [facility]   Final   Culture  Setup Time     Final   Value: 12/12/2013 00:23     Performed at [facility]   Culture     Final   Value:        BLOOD CULTURE RECEIVED NO GROWTH TO DATE CULTURE WILL BE HELD FOR 5 DAYS BEFORE ISSUING A FINAL NEGATIVE REPORT     Performed at [facility]   Report Status PENDING   Incomplete  CULTURE, BLOOD (ROUTINE X 2)     Status: None   Collection Time    12/11/13  7:30 PM      Result Value Ref Range Status   Specimen Description BLOOD LEFT HAND   Final   Special Requests BOTTLES DRAWN AEROBIC ONLY 3.5CC   Final   Culture  Setup Time     Final   Value: 12/12/2013 00:23     Performed at [facility]   Culture     Final   Value:        BLOOD CULTURE RECEIVED NO GROWTH TO DATE CULTURE WILL BE HELD FOR 5 DAYS BEFORE ISSUING A FINAL NEGATIVE REPORT     Performed at [facility]   Report Status PENDING   Incomplete  WOUND CULTURE     Status: None   Collection Time    12/15/13  9:34 AM      Result Value Ref Range Status   Specimen Description WOUND ASPIRATE BACK   Final   Special Requests DISC SPACE L 3 4   Final   Gram Stain     Final   Value: NO WBC SEEN     NO SQUAMOUS EPITHELIAL CELLS SEEN     NO ORGANISMS SEEN     Performed at [facility]   Culture     Final   Value: NO GROWTH 1 DAY     Performed at [facility]   Report Status PENDING   Incomplete    Radiology Reports Dg Abd 1 View  11/12/2013   CLINICAL DATA:  Colitis.  EXAM: ABDOMEN - 1 VIEW  COMPARISON:  CT of the abdomen and pelvis 11/10/2013.  FINDINGS: Oral contrast material is noted throughout the colon and distal rectum. No pathologic distention of small  bowel. No pneumoperitoneum. Multiple surgical clips project over the right upper quadrant of the abdomen. Splenic contour appears enlarged.  IMPRESSION: 1. Nonobstructive bowel gas pattern. 2. No pneumoperitoneum. 3. Splenomegaly.   Electronically Signed   By: Vinnie Langton M.D.   On: 11/12/2013 11:47   Mr Lumbar Spine W Wo Contrast  11/15/2013   CLINICAL DATA:  38 year old male with bacteremia and new back pain. Severe low back pain radiating to the right lower extremity. Initial encounter. HIV, cirrhosis, lymphoma.  EXAM: MRI LUMBAR SPINE WITHOUT AND WITH CONTRAST  TECHNIQUE: Multiplanar and multiecho pulse sequences of the lumbar spine were obtained without and with intravenous contrast.  CONTRAST:  31mL MULTIHANCE GADOBENATE DIMEGLUMINE 529 MG/ML IV SOLN  COMPARISON:  CT Abdomen and Pelvis 11/10/2013 and earlier.  FINDINGS: Normal lumbar segmentation depicted on comparison.  Diffuse loss of normal bone marrow signal in the visible spine, and left iliac bone. However, no marrow edema or destructive osseous lesion identified. No abnormal vertebral  enhancement in the spine.  Visualized lower thoracic spinal cord is normal with conus medularis at L1. Normal cauda equina nerve roots. No abnormal intradural enhancement.  There is mild abnormal STIR and T2 signal in the lower right erector spinae muscles, at the L4 and L5 level (series 5, image 1 and series 6, image 26). This is nonspecific. No definite abnormal enhancement, and other visible paraspinal muscles appear normal. There is mild subcutaneous edema in the lumbar spine, significance doubtful in this setting.  Much of the abdominal viscera are obscured on these images.  T11-T12: Grossly normal.  T12-L1:  Negative.  L1-L2:  Negative.  L2-L3:  Negative.  L3-L4: Mild disc space loss and disc desiccation. Mild circumferential disc osteophyte complex. Mild facet hypertrophy. No significant stenosis.  L4-L5:  Negative except for mild facet hypertrophy.  L5-S1:  Disc space loss with disc desiccation and right eccentric circumferential disc osteophyte complex. Focal central component (series 6, image 31), but no spinal or definite lateral recess stenosis. No foraminal stenosis.  IMPRESSION: 1. Diffuse loss of normal bone marrow signal, but may be post treatment affect rather than due to widespread osseous metastatic disease. No destructive osseous lesion identified. 2. No evidence of lumbar discitis osteomyelitis. Mild to moderate chronic appearing disc degeneration at L3-L4 and L5-S1. No spinal stenosis. 3. Mild signal abnormality in the right lower lumbar erector spinae musculature, nonspecific. Early infectious myositis is not excluded in this setting.   Electronically Signed   By: Lars Pinks M.D.   On: 11/15/2013 20:30   Ir Removal [employer] Access W/ Port W/o Fl Mod Sed  11/16/2013   CLINICAL DATA:  History of bacteremia, concern for seeding of patient's chronic Port a Catheter.  EXAM: REMOVAL OF IMPLANTED TUNNELED PORT-A-CATH  MEDICATIONS: The patient is currently admitted to the hospital and receiving intravenous antibiotics; The antibiotic was administered within 1 hour prior to the start of the procedure.  ANESTHESIA/SEDATION: Intravenous Versed, Fentanyl and Dilaudid was administered for conscious sedation  Sedation time: 20  FLUOROSCOPY TIME:  None  PROCEDURE: Informed written consent was obtained from the patient after a discussion of the risk, benefits and alternatives to the procedure. The patient was positioned supine on the fluoroscopy table and the right chest Port-A-Cath site was prepped with chlorhexidine. A sterile gown and gloves were worn during the procedure. Local anesthesia was provided with 1% lidocaine with epinephrine. A timeout was performed prior to the initiation of the procedure.  An incision was made overlying the Port-A-Cath with a #15 scalpel. Utilizing sharp and blunt dissection, the Port-A-Cath was removed completely. The pocked was irrigated  with sterile saline. Wound closure was performed with subcutaneous 3-0 Monocryl, subcuticular 4-0 Vicryl and Dermabond. A dressing was placed. The patient tolerated the procedure well without immediate post procedural complication.  FINDINGS: Successful removal of implant Port-A-Cath without immediate post procedural complication.  Visual inspection of the Port a catheter pocket was negative for any definitive evidence of infection and as such, the incision was closed primarily with absorbable suture as above.  IMPRESSION: Successful removal of implanted Port-A-Cath.   Electronically Signed   By: Sandi Mariscal M.D.   On: 11/16/2013 16:57    CBC  Recent Labs Lab 12/12/13 0800 12/13/13 0050 12/14/13 0325 12/15/13 0605 12/16/13 0328  WBC 4.4 3.1* 3.3* 3.2* 2.0*  HGB 10.8* 9.9* 9.5* 9.7* 8.9*  HCT 30.2* 27.7* 26.8* 27.2* 25.6*  PLT 109* 72* 84* 77* 63*  MCV 104.1* 104.5* 103.1* 103.8* 105.3*  MCH 37.2*  37.4* 36.5* 37.0* 36.6*  MCHC 35.8 35.7 35.4 35.7 34.8  RDW 15.9* 15.9* 15.5 15.5 15.7*    Chemistries   Recent Labs Lab 12/10/13 2050 12/11/13 0500 12/12/13 0800 12/13/13 0050 12/14/13 0325 12/15/13 0605 12/16/13 0328  NA 122* 124* 125* 125* 129* 131* 130*  K 4.5 4.5 5.4* 5.0 5.0 5.0 5.3  CL 88* 92* 93* 92* 96 97 98  CO2 22 23 23 24 23 23 25   GLUCOSE 103* 86 93 82 77 79 85  BUN 24* 21 17 17 13 14 17   CREATININE 0.86 0.90 0.89 0.83 0.70 0.69 0.93  CALCIUM 9.5 9.1 9.0 8.7 8.7 9.0 8.7  MG  --   --  1.8 1.5 1.8 1.7 1.7  AST 82* 79*  --   --  53*  --   --   ALT 67* 69*  --   --  53  --   --   ALKPHOS 123* 117  --   --  115  --   --   BILITOT 4.7* 4.3*  --   --  2.4*  --   --    ------------------------------------------------------------------------------------------------------------------ estimated creatinine clearance is 118.5 ml/min (by C-G formula based on Cr of  0.93). ------------------------------------------------------------------------------------------------------------------ No results found for this basename: HGBA1C,  in the last 72 hours ------------------------------------------------------------------------------------------------------------------ No results found for this basename: CHOL, HDL, LDLCALC, TRIG, CHOLHDL, LDLDIRECT,  in the last 72 hours ------------------------------------------------------------------------------------------------------------------ No results found for this basename: TSH, T4TOTAL, FREET3, T3FREE, THYROIDAB,  in the last 72 hours ------------------------------------------------------------------------------------------------------------------ No results found for this basename: VITAMINB12, FOLATE, FERRITIN, TIBC, IRON, RETICCTPCT,  in the last 72 hours  Coagulation profile  Recent Labs Lab 12/10/13 2050 12/11/13 1035 12/15/13 0605  INR 1.49 1.53* 1.50*    No results found for this basename: DDIMER,  in the last 72 hours  Cardiac Enzymes No results found for this basename: CK, CKMB, TROPONINI, MYOGLOBIN,  in the last 168 hours ------------------------------------------------------------------------------------------------------------------ No components found with this basename: POCBNP,      Time Spent in minutes   35   Tayvion Lauder L M.D on 12/16/2013 at 9:21 PM   After 7pm go to [email or web address] - password [email or web address]  [facility] [phone]

## 2013-12-16 NOTE — Progress Notes (Signed)
Peripherally Inserted Central Catheter/Midline Placement  The IV Nurse has discussed with the patient and/or persons authorized to consent for the patient, the purpose of this procedure and the potential benefits and risks involved with this procedure.  The benefits include less needle sticks, lab draws from the catheter and patient may be discharged home with the catheter.  Risks include, but not limited to, infection, bleeding, blood clot (thrombus formation), and puncture of an artery; nerve damage and irregular heat beat.  Alternatives to this procedure were also discussed.  PICC/Midline Placement Documentation  PICC / Midline Single Lumen 45/40/98 PICC Right Basilic 43 cm 1 cm (Active)  Indication for Insertion or Continuance of Line Home intravenous therapies (PICC only) 11/25/2013  2:35 PM  Exposed Catheter (cm) 1 cm 11/25/2013  2:35 PM  Site Assessment Clean;Dry;Intact 11/25/2013  2:35 PM  Line Status Flushed;Saline locked 11/25/2013  2:35 PM  Dressing Type Transparent 11/25/2013  2:35 PM  Dressing Status Clean;Dry;Intact 11/25/2013  2:35 PM  Line Care Connections checked and tightened 11/25/2013  5:00 AM  Dressing Change Due 11/27/13 11/25/2013  2:35 PM     PICC / Midline Single Lumen 12/16/13 PICC Right Brachial 40 cm 2 cm (Active)  Indication for Insertion or Continuance of Line Home intravenous therapies (PICC only) 12/16/2013 11:46 AM  Exposed Catheter (cm) 2 cm 12/16/2013 11:46 AM  Site Assessment Clean;Dry;Intact 12/16/2013 11:46 AM  Line Status Flushed;Saline locked;Blood return noted 12/16/2013 11:46 AM  Dressing Type Transparent 12/16/2013 11:46 AM  Dressing Status Clean;Dry;Intact;Antimicrobial disc in place 12/16/2013 11:46 AM  Line Care Connections checked and tightened 12/16/2013 11:46 AM  Dressing Intervention New dressing 12/16/2013 11:46 AM  Dressing Change Due 12/23/13 12/16/2013 11:46 AM       Samuel Schultz 12/16/2013, 11:47 AM

## 2013-12-17 LAB — CBC
HEMATOCRIT: 23.4 % — AB (ref 39.0–52.0)
HEMOGLOBIN: 8.4 g/dL — AB (ref 13.0–17.0)
MCH: 37.2 pg — AB (ref 26.0–34.0)
MCHC: 35.9 g/dL (ref 30.0–36.0)
MCV: 103.5 fL — AB (ref 78.0–100.0)
Platelets: 55 10*3/uL — ABNORMAL LOW (ref 150–400)
RBC: 2.26 MIL/uL — ABNORMAL LOW (ref 4.22–5.81)
RDW: 15.7 % — ABNORMAL HIGH (ref 11.5–15.5)
WBC: 1.8 10*3/uL — AB (ref 4.0–10.5)

## 2013-12-17 LAB — BASIC METABOLIC PANEL
Anion gap: 9 (ref 5–15)
BUN: 18 mg/dL (ref 6–23)
CALCIUM: 8.6 mg/dL (ref 8.4–10.5)
CO2: 25 mEq/L (ref 19–32)
CREATININE: 0.94 mg/dL (ref 0.50–1.35)
Chloride: 95 mEq/L — ABNORMAL LOW (ref 96–112)
GFR calc Af Amer: >90 mL/min (ref >90)
GFR calc non Af Amer: >90 mL/min (ref >90)
GLUCOSE: 88 mg/dL (ref 70–99)
Potassium: 5.2 mEq/L (ref 3.7–5.3)
Sodium: 129 mEq/L — ABNORMAL LOW (ref 137–147)

## 2013-12-17 LAB — WOUND CULTURE
CULTURE: NO GROWTH
Gram Stain: NONE SEEN

## 2013-12-17 MED ORDER — HYDROMORPHONE HCL PF 1 MG/ML IJ SOLN
1.5000 mg | INTRAMUSCULAR | Status: DC | PRN
  Administered 2013-12-17 – 2013-12-19 (×11): 1.5 mg via INTRAVENOUS
  Filled 2013-12-17 (×12): qty 2

## 2013-12-17 MED ORDER — ATOVAQUONE 750 MG/5ML PO SUSP
1500.0000 mg | Freq: Every day | ORAL | Status: DC
  Filled 2013-12-17 (×4): qty 10

## 2013-12-17 NOTE — Progress Notes (Signed)
[facility]  Day 3 daptomycin  Subjective: Still complaining of back pain Antibiotics:  Anti-infectives   Start     Dose/Rate Route Frequency Ordered Stop   12/15/13 1300  DAPTOmycin (CUBICIN) 700 mg in sodium chloride 0.9 % IVPB     700 mg 228 mL/hr over 30 Minutes Intravenous Every 24 hours 12/15/13 1226     12/15/13 1000  azithromycin (ZITHROMAX) tablet 1,200 mg     1,200 mg Oral Every Fri 12/10/13 2050     12/11/13 1000  atovaquone (MEPRON) 750 MG/5ML suspension 1,500 mg  Status:  Discontinued     1,500 mg Oral Daily 12/10/13 2050 12/14/13 0953   12/11/13 0800  Darunavir Ethanolate (PREZISTA) tablet 800 mg     800 mg Oral Daily with breakfast 12/10/13 2050     12/11/13 0800  ritonavir (NORVIR) capsule 100 mg     100 mg Oral Daily with breakfast 12/10/13 2050     12/10/13 2200  emtricitabine-tenofovir (TRUVADA) 200-300 MG per tablet 1 tablet     1 tablet Oral Daily at bedtime 12/10/13 2050        Medications: Scheduled Meds: . azithromycin  1,200 mg Oral Q Fri  . cyclobenzaprine  5 mg Oral QHS  . DAPTOmycin (CUBICIN)  IV  700 mg Intravenous Q24H  . Darunavir Ethanolate  800 mg Oral Q breakfast  . docusate sodium  100 mg Oral BID  . emtricitabine-tenofovir  1 tablet Oral QHS  . feeding supplement (RESOURCE BREEZE)  1 Container Oral BID PC  . furosemide  40 mg Oral BID  . gabapentin  300 mg Oral TID  . lactose free nutrition  237 mL Oral BID  . morphine  15 mg Oral BID  . pantoprazole  40 mg Oral BID  . phytonadione  10 mg Oral Daily  . ritonavir  100 mg Oral Q breakfast  . sodium chloride  3 mL Intravenous Q12H  . sucralfate  1 g Oral TID WC   Continuous Infusions:  PRN Meds:.acetaminophen, HYDROmorphone (DILAUDID) injection, ondansetron (ZOFRAN) IV, sodium chloride    Objective: Weight change:   Intake/Output Summary (Last 24 hours) at 12/17/13 1604 Last data filed at 12/17/13 1544  Gross per 24 hour  Intake    600 ml  Output    2375 ml  Net  -1775 ml   Blood pressure 111/48, pulse 89, temperature 98.4 F (36.9 C), temperature source Oral, resp. rate 16, height 5\' 9"  (1.753 m), weight 195 lb (88.451 kg), SpO2 100.00%. Temp:  [97.8 F (36.6 C)-98.4 F (36.9 C)] 98.4 F (36.9 C) (07/12 1410) Pulse Rate:  [89-102] 89 (07/12 1410) Resp:  [16-18] 16 (07/12 1410) BP: (96-111)/(48-58) 111/48 mmHg (07/12 1410) SpO2:  [99 %-100 %] 100 % (07/12 1410)  Physical Exam: General: Alert and awake, oriented x3, pale coarse voice and clearly in pain  HEENT: anicteric sclera, pupils reactive to light and accommodation, EOMI, oropharynx clear and without exudate  CVS regular rate, normal r, no murmur rubs or gallops  Chest: clear to auscultation bilaterally, no wheezing, rales or rhonchi  Abdomen: soft nontender, nondistended, normal bowel sounds,  Extremities: tenderness in big toe Neuro: No focal deficits but has severe pain with trying to raise his right leg in particular  CBC:  Recent Labs Lab 12/10/13 2050  12/11/13 1035  12/13/13 0050 12/14/13 0325 12/15/13 0605 12/16/13 0328 12/17/13 0620  HGB  --   < >  --   < >  9.9* 9.5* 9.7* 8.9* 8.4*  HCT  --   < >  --   < > 27.7* 26.8* 27.2* 25.6* 23.4*  PLT  --   < >  --   < > 72* 84* 77* 63* 55*  INR 1.49  --  1.53*  --   --   --  1.50*  --   --   < > = values in this interval not displayed.   BMET  Recent Labs  12/16/13 0328 12/17/13 0620  NA 130* 129*  K 5.3 5.2  CL 98 95*  CO2 25 25  GLUCOSE 85 88  BUN 17 18  CREATININE 0.93 0.94  CALCIUM 8.7 8.6     Liver Panel  No results found for this basename: PROT, ALBUMIN, AST, ALT, ALKPHOS, BILITOT, BILIDIR, IBILI,  in the last 72 hours     Sedimentation Rate No results found for this basename: ESRSEDRATE,  in the last 72 hours C-Reactive Protein No results found for this basename: CRP,  in the last 72 hours  Micro Results: Recent Results (from the past 720 hour(s))  AFB CULTURE WITH SMEAR      Status: None   Collection Time    12/11/13 12:44 PM      Result Value Ref Range Status   Specimen Description WOUND   Final   Special Requests LUMBAR 4 AND 5 DISC   Final   Acid Fast Smear     Final   Value: NO ACID FAST BACILLI SEEN     Performed at [facility]   Culture     Final   Value: CULTURE WILL BE EXAMINED FOR 6 WEEKS BEFORE ISSUING A FINAL REPORT     Performed at [facility]   Report Status PENDING   Incomplete  FUNGUS CULTURE W SMEAR     Status: None   Collection Time    12/11/13 12:44 PM      Result Value Ref Range Status   Specimen Description WOUND   Final   Special Requests LUMBAR 4 AND 5 DISC   Final   Fungal Smear     Final   Value: NO YEAST OR FUNGAL ELEMENTS SEEN     Performed at [facility]   Culture     Final   Value: CULTURE IN PROGRESS FOR FOUR WEEKS     Performed at [facility]   Report Status PENDING   Incomplete  CULTURE, ROUTINE-ABSCESS     Status: None   Collection Time    12/11/13 12:44 PM      Result Value Ref Range Status   Specimen Description ABSCESS BACK   Final   Special Requests L4 L5 DISC ASPIRATE   Final   Gram Stain     Final   Value: NO WBC SEEN     NO SQUAMOUS EPITHELIAL CELLS SEEN     NO ORGANISMS SEEN     Performed at [facility]   Culture     Final   Value: NO GROWTH 3 DAYS     Performed at [facility]   Report Status 12/15/2013 FINAL   Final  URINE CULTURE     Status: None   Collection Time    12/11/13  5:14 PM      Result Value Ref Range Status   Specimen Description URINE, CLEAN CATCH   Final   Special Requests NONE   Final   Culture  Setup Time  Final   Value: 12/11/2013 18:43     Performed at [facility]     Final   Value: NO GROWTH     Performed at [facility]   Culture     Final   Value: NO GROWTH     Performed at [facility]   Report Status 12/12/2013 FINAL   Final  CULTURE, BLOOD (ROUTINE X 2)     Status: None     Collection Time    12/11/13  7:20 PM      Result Value Ref Range Status   Specimen Description BLOOD RIGHT HAND   Final   Special Requests BOTTLES DRAWN AEROBIC ONLY [facility]   Final   Culture  Setup Time     Final   Value: 12/12/2013 00:23     Performed at [facility]   Culture     Final   Value:        BLOOD CULTURE RECEIVED NO GROWTH TO DATE CULTURE WILL BE HELD FOR 5 DAYS BEFORE ISSUING A FINAL NEGATIVE REPORT     Performed at [facility]   Report Status PENDING   Incomplete  CULTURE, BLOOD (ROUTINE X 2)     Status: None   Collection Time    12/11/13  7:30 PM      Result Value Ref Range Status   Specimen Description BLOOD LEFT HAND   Final   Special Requests BOTTLES DRAWN AEROBIC ONLY 3.5CC   Final   Culture  Setup Time     Final   Value: 12/12/2013 00:23     Performed at [facility]   Culture     Final   Value:        BLOOD CULTURE RECEIVED NO GROWTH TO DATE CULTURE WILL BE HELD FOR 5 DAYS BEFORE ISSUING A FINAL NEGATIVE REPORT     Performed at [facility]   Report Status PENDING   Incomplete  WOUND CULTURE     Status: None   Collection Time    12/15/13  9:34 AM      Result Value Ref Range Status   Specimen Description WOUND ASPIRATE BACK   Final   Special Requests DISC SPACE L 3 4   Final   Gram Stain     Final   Value: NO WBC SEEN     NO SQUAMOUS EPITHELIAL CELLS SEEN     NO ORGANISMS SEEN     Performed at [facility]   Culture     Final   Value: NO GROWTH 2 DAYS     Performed at [facility]   Report Status 12/17/2013 FINAL   Final    Studies/Results: Dg Chest Port 1 View  12/16/2013   CLINICAL DATA:  Line placement  EXAM: PORTABLE CHEST - 1 VIEW  COMPARISON:  Portable exam 1226 hr compared to 11/06/2013  FINDINGS: RIGHT arm PICC line tip projects over mid SVC.  Upper normal heart size.  Normal mediastinal contours and pulmonary vascularity.  Low lung volumes with RIGHT basilar atelectasis.  No infiltrate, pleural  effusion or pneumothorax.  IMPRESSION: RIGHT basilar atelectasis.  Tip of RIGHT arm PICC line projects over mid SVC.   Electronically Signed   By: Lavonia Dana M.D.   On: 12/16/2013 13:45      Assessment/Plan:  Active Problems:   AIDS   Large cell lymphoma   Cirrhosis, non-alcoholic   Protein-calorie malnutrition, severe  Discitis   Malnutrition of moderate degree    Samuel Schultz is a 38 y.o. 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 He got a dose of vancomycin and another antibiotic he thinks at [facility] unfortunately prior to transfer here. He's undergone I are guided biopsy cx negative to date  He has toe pain and now plain films c/w degnerative disease. Dr. Sharol Given has seen pt and believes he may have gout vs pseudogout.MRI suggestive of gout.  #1 osteomyelitis with vertebral infection and discitis:   --Culture the been taken a second time and still NO GROWTH  and we have started daptomycin   --I am most suspicious for AMP sensitive ENterococcus and think that the VRE and Candida Could have been contaminants that we saw before (though his family do not want to risk of not treating them.)  Will consider adding CEFTAZ on the chance he could have had a pseudomonas or GNR --but his repeat culture of disk also negative and no hx of GNR bacteremia  Will discuss with the pt tomorrow risks benefits of going broader  #2 Toe pain: fine to continue colchicine   #2 HIV/AIDS: continue as Prezista Norvir Truvada, will resume prophylactic antibiotics after we have  nailed down what is going on in his vertebral osteomyelitis. Restart his mepron        LOS: 7 days   Alcide Evener 12/17/2013, 4:04 PM

## 2013-12-17 NOTE — Progress Notes (Signed)
Clinical Education officer, museum (CSW) faxed H&P, FL2, and 30 Day note to [facility].   Blima Rich, Horace Weekend CSW [phone]

## 2013-12-17 NOTE — Progress Notes (Signed)
Chart reviewed.                                                                                                                                                                                                                                             Patient Demographics  Samuel Schultz, is a 38 y.o. male, DOB - [birth year], YDX:[id number]  Admit date - 12/10/2013   Admitting Physician Domenic Polite, MD  Outpatient Primary MD for the patient is [name], MD  LOS - 7   No chief complaint on file.       HPI:  38 y.o. male is here because of history of diffuse large B cell lymphoma, stage IV disease and was treated with R. 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 He was continued on Cubicin and fluconazole through 6/24, 14 days after his last negative culture.   During the same admission he also complained of right-sided lower back pain and leg pain, MRI was found to be negative for discitis . He was started on Flexeril and MS Contin for back pain. He has been progressively getting weaker, has had chills,rigors,, no documented fever at the nursing home. The patient is unable to ambulate because of weakness and pain in his back.   He had an MRI done at [facility] that showed  new/progressive L3-L4 discitis/osteomyelitis but no abscess. He also had WBC count of 6.8, a sodium of 121.     Subjective:  C/o back pain. Gout pain better  Assessment & Plan   1.Discitis/osteomyelitis: first aspiration negative thus far. Repeat aspiration for bacterial cx 7/9 pending. PICC and abx per id  2. AIDS per ID  3. History of advanced Large cell lymphoma  4. 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 Depression on Lexapro.  6. Hyponatremia due to SIADH. Much improved on Lasix along with fluid restriction, continue.  7. Mild hyperkalemia. Stopped Aldactone, gentle Lasix. Post Kayexalate 1 dose. Now upper limits of normal. Monitor closely.  8. Gout: resolved  Pancytopenia worsening since colchicine started. Could cause blood dyscrasias. Will stop.  Code Status: full  Family Communication: none present  Disposition Plan: SNF   Procedures   MRI [place] L spine - diskitis  L-spine aspiration by IR on 12/10/2013 and 12/15/2013.  PICC 12/15/2013   Consults  IR, ID, orthopedics Dr. Sharol Given   Medications  Scheduled Meds: . azithromycin  1,200 mg Oral Q Fri  . cyclobenzaprine  5 mg Oral QHS  . DAPTOmycin (CUBICIN)  IV  700 mg Intravenous Q24H  . Darunavir Ethanolate  800 mg Oral Q breakfast  . docusate sodium  100 mg Oral BID  . emtricitabine-tenofovir  1 tablet Oral QHS  . feeding supplement (RESOURCE BREEZE)  1 Container Oral BID PC  . furosemide  40 mg Oral BID  . gabapentin  300 mg Oral TID  . lactose free nutrition  237 mL Oral BID  . morphine  15 mg Oral BID  . pantoprazole  40 mg Oral BID  . phytonadione  10 mg Oral Daily  . ritonavir  100 mg Oral Q breakfast  . sodium chloride  3 mL Intravenous Q12H  . sucralfate  1 g Oral TID WC   Continuous Infusions:   PRN Meds:.acetaminophen, HYDROmorphone (DILAUDID) injection, ondansetron (ZOFRAN) IV, sodium  chloride  DVT Prophylaxis  SCDs    Lab Results  Component Value Date   PLT 55* 12/17/2013    Antibiotics    Anti-infectives   Start     Dose/Rate Route Frequency Ordered Stop   12/15/13 1300  DAPTOmycin (CUBICIN) 700 mg in sodium chloride 0.9 % IVPB     700 mg 228 mL/hr over 30 Minutes Intravenous Every 24 hours 12/15/13 1226     12/15/13 1000  azithromycin (ZITHROMAX) tablet 1,200 mg     1,200 mg Oral Every Fri 12/10/13 2050     12/11/13 1000  atovaquone (MEPRON) 750 MG/5ML suspension 1,500 mg  Status:  Discontinued     1,500 mg Oral Daily 12/10/13 2050 12/14/13 0953   12/11/13 0800  Darunavir Ethanolate (PREZISTA) tablet 800 mg     800 mg Oral Daily with breakfast 12/10/13 2050     12/11/13 0800  ritonavir (NORVIR) capsule 100 mg     100 mg Oral Daily with breakfast 12/10/13 2050     12/10/13 2200  emtricitabine-tenofovir (TRUVADA) 200-300 MG per tablet 1 tablet     1 tablet Oral Daily at bedtime 12/10/13 2050            Objective:   Filed Vitals:   12/16/13 1445 12/16/13 2103 12/17/13 0515 12/17/13 1410  BP: 96/46 99/49 96/58  111/48  Pulse: 102 102 94 89  Temp: 98.5 F (36.9 C) 98.1 F (36.7 C) 97.8 F (36.6 C) 98.4 F (36.9 C)  TempSrc: Oral Oral Oral Oral  Resp: 18 18 18 16   Height:      Weight:      SpO2: 96% 99% 100% 100%    Wt Readings from Last 3 Encounters:  12/10/13 88.451 kg (195 lb)  12/05/13 87.68 kg (193 lb 4.8 oz)  11/23/13 99.111 kg (218 lb 8 oz)     Intake/Output Summary (Last 24 hours) at 12/17/13 1504 Last data filed  at 12/17/13 1410  Gross per 24 hour  Intake    600 ml  Output   1875 ml  Net  -1275 ml     Physical Exam  Gen: keeps eyes closed during most of interaction, but answers questions. Symmetrical Chest wall movement, Good air movement bilaterally, CTAB RRR,No Gallops,Rubs or new Murmurs,  +ve B.Sounds, Abd Soft, No tenderness, No organomegaly appriciated,  No Cyanosis, Clubbing or edema, No new Rash or bruise , no  tenderness   Data Review   Micro Results Recent Results (from the past 240 hour(s))  AFB CULTURE WITH SMEAR     Status: None   Collection Time    12/11/13 12:44 PM      Result Value Ref Range Status   Specimen Description WOUND   Final   Special Requests LUMBAR 4 AND 5 DISC   Final   Acid Fast Smear     Final   Value: NO ACID FAST BACILLI SEEN     Performed at [facility]   Culture     Final   Value: CULTURE WILL BE EXAMINED FOR 6 WEEKS BEFORE ISSUING A FINAL REPORT     Performed at [facility]   Report Status PENDING   Incomplete  FUNGUS CULTURE W SMEAR     Status: None   Collection Time    12/11/13 12:44 PM      Result Value Ref Range Status   Specimen Description WOUND   Final   Special Requests LUMBAR 4 AND 5 DISC   Final   Fungal Smear     Final   Value: NO YEAST OR FUNGAL ELEMENTS SEEN     Performed at [facility]   Culture     Final   Value: CULTURE IN PROGRESS FOR FOUR WEEKS     Performed at [facility]   Report Status PENDING   Incomplete  CULTURE, ROUTINE-ABSCESS     Status: None   Collection Time    12/11/13 12:44 PM      Result Value Ref Range Status   Specimen Description ABSCESS BACK   Final   Special Requests L4 L5 DISC ASPIRATE   Final   Gram Stain     Final   Value: NO WBC SEEN     NO SQUAMOUS EPITHELIAL CELLS SEEN     NO ORGANISMS SEEN     Performed at [facility]   Culture     Final   Value: NO GROWTH 3 DAYS     Performed at [facility]   Report Status 12/15/2013 FINAL   Final  URINE CULTURE     Status: None   Collection Time    12/11/13  5:14 PM      Result Value Ref Range Status   Specimen Description URINE, CLEAN CATCH   Final   Special Requests NONE   Final   Culture  Setup Time     Final   Value: 12/11/2013 18:43     Performed at [place]     Final   Value: NO GROWTH     Performed at [facility]   Culture     Final   Value: NO GROWTH      Performed at [facility]   Report Status 12/12/2013 FINAL   Final  CULTURE, BLOOD (ROUTINE X 2)     Status: None   Collection Time    12/11/13  7:20  PM      Result Value Ref Range Status   Specimen Description BLOOD RIGHT HAND   Final   Special Requests BOTTLES DRAWN AEROBIC ONLY [facility]   Final   Culture  Setup Time     Final   Value: 12/12/2013 00:23     Performed at [facility]   Culture     Final   Value:        BLOOD CULTURE RECEIVED NO GROWTH TO DATE CULTURE WILL BE HELD FOR 5 DAYS BEFORE ISSUING A FINAL NEGATIVE REPORT     Performed at [facility]   Report Status PENDING   Incomplete  CULTURE, BLOOD (ROUTINE X 2)     Status: None   Collection Time    12/11/13  7:30 PM      Result Value Ref Range Status   Specimen Description BLOOD LEFT HAND   Final   Special Requests BOTTLES DRAWN AEROBIC ONLY 3.5CC   Final   Culture  Setup Time     Final   Value: 12/12/2013 00:23     Performed at [facility]   Culture     Final   Value:        BLOOD CULTURE RECEIVED NO GROWTH TO DATE CULTURE WILL BE HELD FOR 5 DAYS BEFORE ISSUING A FINAL NEGATIVE REPORT     Performed at [facility]   Report Status PENDING   Incomplete  WOUND CULTURE     Status: None   Collection Time    12/15/13  9:34 AM      Result Value Ref Range Status   Specimen Description WOUND ASPIRATE BACK   Final   Special Requests DISC SPACE L 3 4   Final   Gram Stain     Final   Value: NO WBC SEEN     NO SQUAMOUS EPITHELIAL CELLS SEEN     NO ORGANISMS SEEN     Performed at [facility]   Culture     Final   Value: NO GROWTH 2 DAYS     Performed at [facility]   Report Status 12/17/2013 FINAL   Final    Radiology Reports Dg Abd 1 View  11/12/2013   CLINICAL DATA:  Colitis.  EXAM: ABDOMEN - 1 VIEW  COMPARISON:  CT of the abdomen and pelvis 11/10/2013.  FINDINGS: Oral contrast material is noted throughout the colon and distal rectum. No pathologic distention of  small bowel. No pneumoperitoneum. Multiple surgical clips project over the right upper quadrant of the abdomen. Splenic contour appears enlarged.  IMPRESSION: 1. Nonobstructive bowel gas pattern. 2. No pneumoperitoneum. 3. Splenomegaly.   Electronically Signed   By: Vinnie Langton M.D.   On: 11/12/2013 11:47   Mr Lumbar Spine W Wo Contrast  11/15/2013   CLINICAL DATA:  38 year old male with bacteremia and new back pain. Severe low back pain radiating to the right lower extremity. Initial encounter. HIV, cirrhosis, lymphoma.  EXAM: MRI LUMBAR SPINE WITHOUT AND WITH CONTRAST  TECHNIQUE: Multiplanar and multiecho pulse sequences of the lumbar spine were obtained without and with intravenous contrast.  CONTRAST:  25mL MULTIHANCE GADOBENATE DIMEGLUMINE 529 MG/ML IV SOLN  COMPARISON:  CT Abdomen and Pelvis 11/10/2013 and earlier.  FINDINGS: Normal lumbar segmentation depicted on comparison.  Diffuse loss of normal bone marrow signal in the visible spine, and left iliac bone. However, no marrow edema or destructive osseous lesion identified. No abnormal vertebral enhancement in the spine.  Visualized  lower thoracic spinal cord is normal with conus medularis at L1. Normal cauda equina nerve roots. No abnormal intradural enhancement.  There is mild abnormal STIR and T2 signal in the lower right erector spinae muscles, at the L4 and L5 level (series 5, image 1 and series 6, image 26). This is nonspecific. No definite abnormal enhancement, and other visible paraspinal muscles appear normal. There is mild subcutaneous edema in the lumbar spine, significance doubtful in this setting.  Much of the abdominal viscera are obscured on these images.  T11-T12: Grossly normal.  T12-L1:  Negative.  L1-L2:  Negative.  L2-L3:  Negative.  L3-L4: Mild disc space loss and disc desiccation. Mild circumferential disc osteophyte complex. Mild facet hypertrophy. No significant stenosis.  L4-L5:  Negative except for mild facet hypertrophy.   L5-S1: Disc space loss with disc desiccation and right eccentric circumferential disc osteophyte complex. Focal central component (series 6, image 31), but no spinal or definite lateral recess stenosis. No foraminal stenosis.  IMPRESSION: 1. Diffuse loss of normal bone marrow signal, but may be post treatment affect rather than due to widespread osseous metastatic disease. No destructive osseous lesion identified. 2. No evidence of lumbar discitis osteomyelitis. Mild to moderate chronic appearing disc degeneration at L3-L4 and L5-S1. No spinal stenosis. 3. Mild signal abnormality in the right lower lumbar erector spinae musculature, nonspecific. Early infectious myositis is not excluded in this setting.   Electronically Signed   By: Lars Pinks M.D.   On: 11/15/2013 20:30   Ir Removal [employer] Access W/ Port W/o Fl Mod Sed  11/16/2013   CLINICAL DATA:  History of bacteremia, concern for seeding of patient's chronic Port a Catheter.  EXAM: REMOVAL OF IMPLANTED TUNNELED PORT-A-CATH  MEDICATIONS: The patient is currently admitted to the hospital and receiving intravenous antibiotics; The antibiotic was administered within 1 hour prior to the start of the procedure.  ANESTHESIA/SEDATION: Intravenous Versed, Fentanyl and Dilaudid was administered for conscious sedation  Sedation time: 20  FLUOROSCOPY TIME:  None  PROCEDURE: Informed written consent was obtained from the patient after a discussion of the risk, benefits and alternatives to the procedure. The patient was positioned supine on the fluoroscopy table and the right chest Port-A-Cath site was prepped with chlorhexidine. A sterile gown and gloves were worn during the procedure. Local anesthesia was provided with 1% lidocaine with epinephrine. A timeout was performed prior to the initiation of the procedure.  An incision was made overlying the Port-A-Cath with a #15 scalpel. Utilizing sharp and blunt dissection, the Port-A-Cath was removed completely. The pocked was  irrigated with sterile saline. Wound closure was performed with subcutaneous 3-0 Monocryl, subcuticular 4-0 Vicryl and Dermabond. A dressing was placed. The patient tolerated the procedure well without immediate post procedural complication.  FINDINGS: Successful removal of implant Port-A-Cath without immediate post procedural complication.  Visual inspection of the Port a catheter pocket was negative for any definitive evidence of infection and as such, the incision was closed primarily with absorbable suture as above.  IMPRESSION: Successful removal of implanted Port-A-Cath.   Electronically Signed   By: Sandi Mariscal M.D.   On: 11/16/2013 16:57    CBC  Recent Labs Lab 12/13/13 0050 12/14/13 0325 12/15/13 0605 12/16/13 0328 12/17/13 0620  WBC 3.1* 3.3* 3.2* 2.0* 1.8*  HGB 9.9* 9.5* 9.7* 8.9* 8.4*  HCT 27.7* 26.8* 27.2* 25.6* 23.4*  PLT 72* 84* 77* 63* 55*  MCV 104.5* 103.1* 103.8* 105.3* 103.5*  MCH 37.4* 36.5* 37.0* 36.6* 37.2*  MCHC  35.7 35.4 35.7 34.8 35.9  RDW 15.9* 15.5 15.5 15.7* 15.7*    Chemistries   Recent Labs Lab 12/10/13 2050 12/11/13 0500 12/12/13 0800 12/13/13 0050 12/14/13 0325 12/15/13 0605 12/16/13 0328 12/17/13 0620  NA 122* 124* 125* 125* 129* 131* 130* 129*  K 4.5 4.5 5.4* 5.0 5.0 5.0 5.3 5.2  CL 88* 92* 93* 92* 96 97 98 95*  CO2 22 23 23 24 23 23 25 25   GLUCOSE 103* 86 93 82 77 79 85 88  BUN 24* 21 17 17 13 14 17 18   CREATININE 0.86 0.90 0.89 0.83 0.70 0.69 0.93 0.94  CALCIUM 9.5 9.1 9.0 8.7 8.7 9.0 8.7 8.6  MG  --   --  1.8 1.5 1.8 1.7 1.7  --   AST 82* 79*  --   --  53*  --   --   --   ALT 67* 69*  --   --  53  --   --   --   ALKPHOS 123* 117  --   --  115  --   --   --   BILITOT 4.7* 4.3*  --   --  2.4*  --   --   --    ------------------------------------------------------------------------------------------------------------------ estimated creatinine clearance is 117.3 ml/min (by C-G formula based on Cr of  0.94). ------------------------------------------------------------------------------------------------------------------ No results found for this basename: HGBA1C,  in the last 72 hours ------------------------------------------------------------------------------------------------------------------ No results found for this basename: CHOL, HDL, LDLCALC, TRIG, CHOLHDL, LDLDIRECT,  in the last 72 hours ------------------------------------------------------------------------------------------------------------------ No results found for this basename: TSH, T4TOTAL, FREET3, T3FREE, THYROIDAB,  in the last 72 hours ------------------------------------------------------------------------------------------------------------------ No results found for this basename: VITAMINB12, FOLATE, FERRITIN, TIBC, IRON, RETICCTPCT,  in the last 72 hours  Coagulation profile  Recent Labs Lab 12/10/13 2050 12/11/13 1035 12/15/13 0605  INR 1.49 1.53* 1.50*    No results found for this basename: DDIMER,  in the last 72 hours  Cardiac Enzymes No results found for this basename: CK, CKMB, TROPONINI, MYOGLOBIN,  in the last 168 hours ------------------------------------------------------------------------------------------------------------------ No components found with this basename: POCBNP,      Time Spent in minutes   25   Yacqub Baston L M.D on 12/17/2013 at 3:04 PM   After 7pm go to [email or web address] - password [facility]  [facility] [phone]

## 2013-12-18 LAB — BASIC METABOLIC PANEL
Anion gap: 8 (ref 5–15)
BUN: 19 mg/dL (ref 6–23)
CHLORIDE: 96 meq/L (ref 96–112)
CO2: 26 mEq/L (ref 19–32)
Calcium: 9.2 mg/dL (ref 8.4–10.5)
Creatinine, Ser: 0.93 mg/dL (ref 0.50–1.35)
GFR calc Af Amer: >90 mL/min (ref >90)
GLUCOSE: 96 mg/dL (ref 70–99)
Potassium: 5.4 mEq/L — ABNORMAL HIGH (ref 3.7–5.3)
Sodium: 130 mEq/L — ABNORMAL LOW (ref 137–147)

## 2013-12-18 LAB — CULTURE, BLOOD (ROUTINE X 2)
CULTURE: NO GROWTH
Culture: NO GROWTH

## 2013-12-18 LAB — CBC
HEMATOCRIT: 25.2 % — AB (ref 39.0–52.0)
Hemoglobin: 8.9 g/dL — ABNORMAL LOW (ref 13.0–17.0)
MCH: 36.8 pg — ABNORMAL HIGH (ref 26.0–34.0)
MCHC: 35.3 g/dL (ref 30.0–36.0)
MCV: 104.1 fL — ABNORMAL HIGH (ref 78.0–100.0)
Platelets: 61 10*3/uL — ABNORMAL LOW (ref 150–400)
RBC: 2.42 MIL/uL — ABNORMAL LOW (ref 4.22–5.81)
RDW: 15.7 % — AB (ref 11.5–15.5)
WBC: 2.1 10*3/uL — ABNORMAL LOW (ref 4.0–10.5)

## 2013-12-18 MED ORDER — BOOST / RESOURCE BREEZE PO LIQD
1.0000 | Freq: Two times a day (BID) | ORAL | Status: DC
Start: 2013-12-18 — End: 2014-01-01

## 2013-12-18 MED ORDER — MORPHINE SULFATE ER 15 MG PO TBCR: 15.0000 mg | EXTENDED_RELEASE_TABLET | Freq: Two times a day (BID) | ORAL | Status: DC

## 2013-12-18 MED ORDER — COLCHICINE 0.6 MG PO TABS: 0.6000 mg | ORAL_TABLET | Freq: Every day | ORAL | Status: DC

## 2013-12-18 MED ORDER — CYCLOBENZAPRINE HCL 5 MG PO TABS: 5.0000 mg | ORAL_TABLET | Freq: Every day | ORAL | Status: DC

## 2013-12-18 MED ORDER — DEXTROSE 5 % IV SOLN
2.0000 g | Freq: Three times a day (TID) | INTRAVENOUS | Status: DC
  Administered 2013-12-18 – 2013-12-19 (×4): 2 g via INTRAVENOUS
  Filled 2013-12-18 (×6): qty 2

## 2013-12-18 MED ORDER — COLCHICINE 0.6 MG PO TABS
0.6000 mg | ORAL_TABLET | Freq: Every day | ORAL | Status: DC
  Administered 2013-12-18 – 2013-12-19 (×2): 0.6 mg via ORAL
  Filled 2013-12-18 (×2): qty 1

## 2013-12-18 MED ORDER — IBUPROFEN 400 MG PO TABS
400.0000 mg | ORAL_TABLET | Freq: Once | ORAL | Status: AC
  Administered 2013-12-18: 400 mg via ORAL
  Filled 2013-12-18: qty 1

## 2013-12-18 MED ORDER — AZITHROMYCIN 600 MG PO TABS: 1200.0000 mg | ORAL_TABLET | ORAL | Status: DC

## 2013-12-18 MED ORDER — FUROSEMIDE 40 MG PO TABS: 40.0000 mg | ORAL_TABLET | Freq: Two times a day (BID) | ORAL | Status: DC

## 2013-12-18 MED ORDER — SODIUM POLYSTYRENE SULFONATE 15 GM/60ML PO SUSP
45.0000 g | Freq: Once | ORAL | Status: AC
  Administered 2013-12-18: 45 g via ORAL
  Filled 2013-12-18: qty 180

## 2013-12-18 MED ORDER — DEXTROSE 5 % IV SOLN: 2.0000 g | Freq: Three times a day (TID) | INTRAVENOUS | Status: DC

## 2013-12-18 MED ORDER — BOOST PLUS PO LIQD: 237.0000 mL | Freq: Two times a day (BID) | ORAL | Status: DC

## 2013-12-18 MED ORDER — SODIUM CHLORIDE 0.9 % IV SOLN: 700.0000 mg | INTRAVENOUS | Status: DC

## 2013-12-18 NOTE — Discharge Summary (Addendum)
Physician Discharge Summary  Samuel Schultz KYH:[id number] DOB: June 07, 1976 DOA: 12/10/2013  PCP: [name], MD  Admit date: 12/10/2013 Discharge date: 12/19/2013  Time spent: 35 minutes  Recommendations for Outpatient Follow-up:  1. To SNF 2. Needs IV abx for at least 6 weeks  weekly cbc, bmp and CK faxed to Dr. Tommy Medal at [phone]    Discharge Diagnoses:  Active Problems:   AIDS   Large cell lymphoma   Cirrhosis, non-alcoholic   Protein-calorie malnutrition, severe   Discitis   Malnutrition of moderate degree   Discharge Condition: improved  Diet recommendation: 1200 ml fluid restriction with regular diet  Filed Weights   12/10/13 1904  Weight: 88.451 kg (195 lb)    History of present illness:  38 y.o. male is here because of history of diffuse large B cell lymphoma, stage IV disease and was treated with R. 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 He was continued on Cubicin and fluconazole through 6/24, 14 days after his last negative culture.  During the same admission he also complained of right-sided lower back pain and leg pain, MRI was found to be negative for discitis . He was started on Flexeril and MS Contin for back pain.  He has been progressively getting weaker, has  had chills,rigors,, no documented fever at the nursing home. The patient is unable to ambulate because of weakness and pain in his back. He had an MRI done at [facility] ED that showed new/progressive L3-L4 discitis/osteomyelitis but no abscess. He also had WBC count of 6.8, a sodium of 121.  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 Subsequently had a PICC line placed, which was removed after completion of fluconazole and [facility] Course:  osteomyelitis with vertebral infection and discitis:  --Culture the been taken a second time and still NO GROWTH and we have started daptomycin (only abx as not able to take zyvox and grew VRE in past) ID suspicious for AMP sensitive ENterococcus and think that the VRE and Candida Could have been contaminants that we saw before (though his family do not want to risk of not treating them.)  -added CEFTAZ on the chance he could have had a pseudomonas or GNR --but his repeat culture of disk also negative and no hx of GNR bacteremia  - at least  6 weeks of abx, needs to follow up with ID in 3-4 weeks  Toe pain: fine to continue colchicine- monitor CBC as risk of blood dyscrasias- spoke at length with patient about risks- he thinks he was getting benefit from the medication and thus wants it restarted  HIV/AIDS: continue as Prezista Norvir Truvada, will resume prophylactic antibiotics after we have nailed down what is going on in his vertebral osteomyelitis. 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 Much improved on Lasix along with fluid restriction   Mild hyperkalemia. Stopped  Aldactone, gentle Lasix. Post Kayexalate 1 dose. Now upper limits of normal. Monitor closely.   Gout: resolved   Procedures:  aspiration  Consultations:  ID  IR  Ortho Sharol Given)  Discharge Exam: Filed Vitals:   12/19/13 0632  BP: 92/50  Pulse: 98  Temp: 98.3 F (36.8 C)  Resp: 20    General: A+Ox3, NAD Cardiovascular: rrr Respiratory: clear  Discharge Instructions You were cared for by a hospitalist during your hospital stay. If you have any questions about your discharge medications or the care you received while you were in the hospital after you are discharged, you can call the unit and asked to speak with the hospitalist on call if the hospitalist that took care of you is not available. Once you are discharged, your primary care physician will handle any further medical issues. Please note that NO REFILLS for any discharge medications will be authorized once you are discharged, as it is imperative that you return to your primary care physician (or establish a relationship with a primary care physician if you do not have one) for your aftercare needs so that they can reassess your need for medications and monitor your lab values.      Discharge Instructions   Discharge instructions    Complete by:  As directed   Cbc, cmp weekly- may need to stop colchicine if pancytopenia continues Continue IV abx for at least 6 weeks- will need follow up with Dr. Tommy Medal in 3-4 weeks Fluid restrict to 1.2L while Na low     Increase activity slowly    Complete by:  As directed             Medication List    STOP taking these medications       diphenoxylate-atropine 2.5-0.025 MG per tablet  Commonly known as:  LOMOTIL     escitalopram 20 MG tablet  Commonly known as:  LEXAPRO     spironolactone 100 MG tablet  Commonly known as:  ALDACTONE      TAKE these medications       atovaquone 750 MG/5ML suspension  Commonly known as:  MEPRON  Take 1,500 mg by mouth daily.      azithromycin 600 MG tablet  Commonly known as:  ZITHROMAX  Take 2 tablets (1,200 mg total) by mouth every Friday.     cefTAZidime 2 g in dextrose 5 % 50 mL  Inject 2 g into the vein every 8 (eight) hours.     colchicine 0.6 MG tablet  Take 1 tablet (0.6 mg total) by mouth daily.     cyclobenzaprine 5 MG tablet  Commonly known as:  FLEXERIL  Take 1 tablet (5 mg total) by mouth at bedtime.     DAPTOmycin 700 mg in sodium chloride 0.9 % 100 mL  Inject 700 mg into the vein daily.     emtricitabine-tenofovir 200-300 MG per tablet  Commonly known as:  TRUVADA  Take 1 tablet by mouth at bedtime.     feeding supplement (RESOURCE BREEZE) Liqd  Take 1 Container by mouth 2 (two) times daily after a meal.     lactose free nutrition Liqd  Take 237 mLs by mouth 2 (two) times  daily.     furosemide 40 MG tablet  Commonly known as:  LASIX  Take 1 tablet (40 mg total) by mouth 2 (two) times daily.     gabapentin 300 MG capsule  Commonly known as:  NEURONTIN  Take 300 mg by mouth 3 (three) times daily.     methocarbamol 500 MG tablet  Commonly known as:  ROBAXIN  Take 1 tablet (500 mg total) by mouth every 6 (six) hours as needed for muscle spasms.     morphine 15 MG 12 hr tablet  Commonly known as:  MS CONTIN  Take 1 tablet (15 mg total) by mouth 2 (two) times daily.     pantoprazole 40 MG tablet  Commonly known as:  PROTONIX  Take 40 mg by mouth 2 (two) times daily.     phytonadione 5 MG tablet  Commonly known as:  VITAMIN K  Take 10 mg by mouth daily.     PREZISTA 800 MG tablet  Generic drug:  Darunavir Ethanolate  Take 800 mg by mouth daily.     ritonavir 100 MG capsule  Commonly known as:  NORVIR  Take 100 mg by mouth daily with breakfast.     sucralfate 1 G tablet  Commonly known as:  CARAFATE  Take 1 g by mouth 3 (three) times daily with meals.       Allergies  Allergen Reactions  . Codeine Anaphylaxis, Hives, Nausea Only, Swelling and Nausea And Vomiting  .  Oxycodone     Other reaction(s): Dizziness Lightheaded, nausea   Follow-up Information   Follow up with DUDA,MARCUS V, MD In 3 weeks.   Specialty:  Orthopedic Surgery   Contact information:   [address] [place] [phone]        The results of significant diagnostics from this hospitalization (including imaging, microbiology, ancillary and laboratory) are listed below for reference.    Significant Diagnostic Studies: Mr Foot Left W Wo Contrast  12/14/2013   CLINICAL DATA:  Osteomyelitis.  Bacteremia.  Great toe pain.  EXAM: MRI OF THE LEFT FOREFOOT WITHOUT AND WITH CONTRAST  TECHNIQUE: Multiplanar, multisequence MR imaging was performed both before and after administration of intravenous contrast.  CONTRAST:  27mL MULTIHANCE GADOBENATE DIMEGLUMINE 529 MG/ML IV SOLN  COMPARISON:  12/13/2013.  FINDINGS: There is no osteomyelitis. No effacement of normal fatty marrow or erosive changes. No soft tissue abscess. Flexor and extensor tendons appear within normal limits. Mild subcutaneous edema is present over the lateral dorsum of the foot, which may represent dependent edema or cellulitis in the appropriate clinical setting.  Although only partially visualized, there appears to be midfoot enhancement suspicious for small marginal erosions. This can be associated with gout arthropathy and correlation with serum uric acid is recommended. Amyloid in a patient with renal failure could produce a similar appearance.  There is no first MTP joint osteoarthritis or erosive change. No joint effusion in the forefoot.  IMPRESSION: 1. Negative for osteomyelitis. 2. Mild dorsal forefoot edema likely representing cellulitis or dependent edema. 3. Inflammatory changes in enhancement in the midfoot, most commonly associated with gout arthritis. Consider correlation with serum uric acid.   Electronically Signed   By: Dereck Ligas M.D.   On: 12/14/2013 16:54   Ir Fluoro Guide Ndl Plmt /  Bx  12/15/2013   CLINICAL DATA:  Low back pain.  L3-L4 discitis/osteomyelitis.  EXAM: IR FLUORO GUIDE NEEDLE PLACEMENT /BIOPSY  ANESTHESIA/SEDATION: Conscious sedation.  MEDICATIONS: Versed 1 mg IV.  Fentanyl 75 mcg IV.  CONTRAST:  None.  PROCEDURE: Following full explanation of the procedure along with the potential associated complications, an informed witnessed consent was obtained.  Patient was laid prone on the fluoroscopic table. The skin overlying the lumbar region was then prepped and draped in the usual sterile fashion. The skin entry site in the right parasagittal region at L3-L4 was then infiltrated with 0.25% Bupivacaine and carried into the paraspinous soft tissues.  Using biplane intermittent fluoroscopy, a 20 gauge Francine biopsy needle was then advanced into the L3-L4 disc space without difficulty. Using a 20 mL syringe, bloody aspirate was obtained and sent for microbiologic analysis. A second pass was similarly made with the new Francine biopsy needle in a different location at L3-L4. Again using a 20 mL syringe, bloody aspirate was obtained and sent for microbiologic analysis. The needle was removed. Hemostasis was achieved at the skin entry site. Patient tolerated the procedure well.  COMPLICATIONS: None immediate.  FINDINGS: As above.  IMPRESSION: Status post fluoroscopic-guided needle placement for disc aspiration at L3-L4 as described above without event.   Electronically Signed   By: Luanne Bras M.D.   On: 12/15/2013 12:00   Ir Fluoro Guide Ndl Plmt / Bx  12/12/2013   CLINICAL DATA:  Severe low back pain secondary to discitis at L3-4 .  EXAM: IR FLUORO GUIDE NEEDLE PLACEMENT /BIOPSY  ANESTHESIA/SEDATION: Conscious sedation.  MEDICATIONS: Versed 1 mg IV.  Fentanyl 100 mcg IV.  CONTRAST:  None.  PROCEDURE: Following a full explanation of the procedure along with the potential associated complications, an informed witnessed consent was obtained. The patient laid prone on the  fluoroscopic table. The skin overlying the lumbar region was then prepped and draped in the usual sterile fashion. The skin entry site at L3-L4 in the right parasagittal soft tissues was then infiltrated with 0.25% bupivacaine, and extending into the paraspinous muscles.  Using biplane intermittent fluoroscopy, a 20 gauge Francine biopsy needle was then advanced into the L3-L4 disc space without difficulty. Using a 20 mL syringe, bloody aspirate was obtained. A second pass was then made with a different 20 gauge Francine needle. Again using a 20 mL syringe, bloody aspirate was obtained. The total amount of aspirate was approximately 2 mL. The specimen was sent for microbiological analysis. Hemostasis was achieved at the skin entry site. The patient tolerated the procedure well.  COMPLICATIONS: None immediate.  FINDINGS: As above.  IMPRESSION: Status post fluoroscopic guided disk aspiration at L3-L4 as described above without event.   Electronically Signed   By: Luanne Bras M.D.   On: 12/11/2013 13:06   Dg Chest Port 1 View  12/16/2013   CLINICAL DATA:  Line placement  EXAM: PORTABLE CHEST - 1 VIEW  COMPARISON:  Portable exam 1226 hr compared to 11/06/2013  FINDINGS: RIGHT arm PICC line tip projects over mid SVC.  Upper normal heart size.  Normal mediastinal contours and pulmonary vascularity.  Low lung volumes with RIGHT basilar atelectasis.  No infiltrate, pleural effusion or pneumothorax.  IMPRESSION: RIGHT basilar atelectasis.  Tip of RIGHT arm PICC line projects over mid SVC.   Electronically Signed   By: Lavonia Dana M.D.   On: 12/16/2013 13:45   Dg Foot Complete Left  12/13/2013   CLINICAL DATA:  Left foot pain.  EXAM: LEFT FOOT - COMPLETE 3+ VIEW  COMPARISON:  None.  FINDINGS: There are slight arthritic changes of the first metatarsal phalangeal joint. The bones are otherwise normal other than a  tiny plantar calcaneal spur.  IMPRESSION: Minimal degenerative changes of the first metatarsal  phalangeal joint.   Electronically Signed   By: Rozetta Nunnery M.D.   On: 12/13/2013 19:09    Microbiology: Recent Results (from the past 240 hour(s))  AFB CULTURE WITH SMEAR     Status: None   Collection Time    12/11/13 12:44 PM      Result Value Ref Range Status   Specimen Description WOUND   Final   Special Requests LUMBAR 4 AND 5 DISC   Final   Acid Fast Smear     Final   Value: NO ACID FAST BACILLI SEEN     Performed at [facility]   Culture     Final   Value: CULTURE WILL BE EXAMINED FOR 6 WEEKS BEFORE ISSUING A FINAL REPORT     Performed at [facility]   Report Status PENDING   Incomplete  FUNGUS CULTURE W SMEAR     Status: None   Collection Time    12/11/13 12:44 PM      Result Value Ref Range Status   Specimen Description WOUND   Final   Special Requests LUMBAR 4 AND 5 DISC   Final   Fungal Smear     Final   Value: NO YEAST OR FUNGAL ELEMENTS SEEN     Performed at [facility]   Culture     Final   Value: CULTURE IN PROGRESS FOR FOUR WEEKS     Performed at [facility]   Report Status PENDING   Incomplete  CULTURE, ROUTINE-ABSCESS     Status: None   Collection Time    12/11/13 12:44 PM      Result Value Ref Range Status   Specimen Description ABSCESS BACK   Final   Special Requests L4 L5 DISC ASPIRATE   Final   Gram Stain     Final   Value: NO WBC SEEN     NO SQUAMOUS EPITHELIAL CELLS SEEN     NO ORGANISMS SEEN     Performed at [facility]   Culture     Final   Value: NO GROWTH 3 DAYS     Performed at [facility]   Report Status 12/15/2013 FINAL   Final  URINE CULTURE     Status: None   Collection Time    12/11/13  5:14 PM      Result Value Ref Range Status   Specimen Description URINE, CLEAN CATCH   Final   Special Requests NONE   Final   Culture  Setup Time     Final   Value: 12/11/2013 18:43     Performed at [place]     Final   Value: NO GROWTH     Performed at [facility]   Culture     Final   Value: NO GROWTH     Performed at [facility]   Report Status 12/12/2013 FINAL   Final  CULTURE, BLOOD (ROUTINE X 2)     Status: None   Collection Time    12/11/13  7:20 PM      Result Value Ref Range Status   Specimen Description BLOOD RIGHT HAND   Final   Special Requests BOTTLES DRAWN AEROBIC ONLY [facility]   Final   Culture  Setup Time     Final   Value: 12/12/2013 00:23     Performed at  [facility]     Final   Value: NO GROWTH 5 DAYS     Performed at [facility]   Report Status 12/18/2013 FINAL   Final  CULTURE, BLOOD (ROUTINE X 2)     Status: None   Collection Time    12/11/13  7:30 PM      Result Value Ref Range Status   Specimen Description BLOOD LEFT HAND   Final   Special Requests BOTTLES DRAWN AEROBIC ONLY 3.5CC   Final   Culture  Setup Time     Final   Value: 12/12/2013 00:23     Performed at [facility]   Culture     Final   Value: NO GROWTH 5 DAYS     Performed at [facility]   Report Status 12/18/2013 FINAL   Final  WOUND CULTURE     Status: None   Collection Time    12/15/13  9:34 AM      Result Value Ref Range Status   Specimen Description WOUND ASPIRATE BACK   Final   Special Requests DISC SPACE L 3 4   Final   Gram Stain     Final   Value: NO WBC SEEN     NO SQUAMOUS EPITHELIAL CELLS SEEN     NO ORGANISMS SEEN     Performed at [facility]   Culture     Final   Value: NO GROWTH 2 DAYS     Performed at [facility]   Report Status 12/17/2013 FINAL   Final     Labs: Basic Metabolic Panel:  Recent Labs Lab 12/13/13 0050 12/14/13 0325 12/15/13 0605 12/16/13 0328 12/17/13 0620 12/18/13 0532 12/19/13 0549  NA 125* 129* 131* 130* 129* 130* 131*  K 5.0 5.0 5.0 5.3 5.2 5.4* 5.6*  CL 92* 96 97 98 95* 96 97  CO2 24 23 23 25 25 26 25   GLUCOSE 82 77 79 85 88 96 119*  BUN 17 13 14 17 18 19 19   CREATININE 0.83 0.70 0.69 0.93 0.94 0.93 0.95  CALCIUM  8.7 8.7 9.0 8.7 8.6 9.2 9.5  MG 1.5 1.8 1.7 1.7  --   --   --    Liver Function Tests:  Recent Labs Lab 12/14/13 0325  AST 53*  ALT 53  ALKPHOS 115  BILITOT 2.4*  PROT 7.3  ALBUMIN 2.4*   No results found for this basename: LIPASE, AMYLASE,  in the last 168 hours No results found for this basename: AMMONIA,  in the last 168 hours CBC:  Recent Labs Lab 12/15/13 0605 12/16/13 0328 12/17/13 0620 12/18/13 0532 12/19/13 0549  WBC 3.2* 2.0* 1.8* 2.1* 2.1*  HGB 9.7* 8.9* 8.4* 8.9* 8.9*  HCT 27.2* 25.6* 23.4* 25.2* 25.2*  MCV 103.8* 105.3* 103.5* 104.1* 104.6*  PLT 77* 63* 55* 61* 53*   Cardiac Enzymes:  Recent Labs Lab 12/19/13 0549  CKTOTAL 32   BNP: BNP (last 3 results) No results found for this basename: PROBNP,  in the last 8760 hours CBG: No results found for this basename: GLUCAP,  in the last 168 hours     Signed:  Eliseo Squires, Sharif Rendell  [facility] 12/19/2013, 11:42 AM

## 2013-12-18 NOTE — Clinical Social Work Note (Signed)
Pt bed offer at [facility] lost due to medication cost at discharge. Pt has been offered SNF bed at [facility]. Per [facility] admissions liaison, pt's bed will be available on 12/19/2013. Pt and pt's RN updated at bedside. CSW to continue to follow and assist with discharge on 12/19/2013.  Lubertha Sayres, MSW, [facility] Licensed Clinical Social Worker [phone] and [phone]

## 2013-12-18 NOTE — Progress Notes (Signed)
PT Cancellation Note  Patient Details Name: Holger Sokolowski MRN: [id number] DOB: 1976/03/27   Cancelled Treatment:    Reason Eval/Treat Not Completed: Patient declined, no reason specified. 12/18/2013  Donnella Sham, [place] [phone]  (pager)   Juanjesus Pepperman, Tessie Fass 12/18/2013, 4:21 PM

## 2013-12-18 NOTE — Progress Notes (Signed)
ANTIBIOTIC CONSULT NOTE   Pharmacy Consult for Daptomycin and Ceftazidime Indication: osteomyelitis with vertebral infection and discitis  Allergies  Allergen Reactions  . Codeine Anaphylaxis, Hives, Nausea Only, Swelling and Nausea And Vomiting  . Oxycodone     Other reaction(s): Dizziness Lightheaded, nausea   Patient Measurements: Height: 5\' 9"  (175.3 cm) Weight: 195 lb (88.451 kg) IBW/kg (Calculated) : 70.7  Vital Signs: Temp: 98 F (36.7 C) (07/13 0656) Temp src: Oral (07/13 0656) BP: 101/49 mmHg (07/13 0656) Pulse Rate: 90 (07/13 0656) Intake/Output from previous day: 07/12 0701 - 07/13 0700 In: 1302 [P.O.:960; IV Piggyback:342] Out: 1324 [Urine:4150]  Labs:  Recent Labs  12/16/13 0328 12/17/13 0620 12/18/13 0532  WBC 2.0* 1.8* 2.1*  HGB 8.9* 8.4* 8.9*  PLT 63* 55* 61*  CREATININE 0.93 0.94 0.93   Estimated Creatinine Clearance: 118.5 ml/min (by C-G formula based on Cr of 0.93).  Microbiology: Recent Results (from the past 720 hour(s))  AFB CULTURE WITH SMEAR     Status: None   Collection Time    12/11/13 12:44 PM      Result Value Ref Range Status   Specimen Description WOUND   Final   Special Requests LUMBAR 4 AND 5 DISC   Final   Acid Fast Smear     Final   Value: NO ACID FAST BACILLI SEEN     Performed at [facility]   Culture     Final   Value: CULTURE WILL BE EXAMINED FOR 6 WEEKS BEFORE ISSUING A FINAL REPORT     Performed at [facility]   Report Status PENDING   Incomplete  FUNGUS CULTURE W SMEAR     Status: None   Collection Time    12/11/13 12:44 PM      Result Value Ref Range Status   Specimen Description WOUND   Final   Special Requests LUMBAR 4 AND 5 DISC   Final   Fungal Smear     Final   Value: NO YEAST OR FUNGAL ELEMENTS SEEN     Performed at [facility]   Culture     Final   Value: CULTURE IN PROGRESS FOR FOUR WEEKS     Performed at [facility]   Report Status PENDING   Incomplete  CULTURE,  ROUTINE-ABSCESS     Status: None   Collection Time    12/11/13 12:44 PM      Result Value Ref Range Status   Specimen Description ABSCESS BACK   Final   Special Requests L4 L5 DISC ASPIRATE   Final   Gram Stain     Final   Value: NO WBC SEEN     NO SQUAMOUS EPITHELIAL CELLS SEEN     NO ORGANISMS SEEN     Performed at [facility]   Culture     Final   Value: NO GROWTH 3 DAYS     Performed at [facility]   Report Status 12/15/2013 FINAL   Final  URINE CULTURE     Status: None   Collection Time    12/11/13  5:14 PM      Result Value Ref Range Status   Specimen Description URINE, CLEAN CATCH   Final   Special Requests NONE   Final   Culture  Setup Time     Final   Value: 12/11/2013 18:43     Performed at [place]     Final  Value: NO GROWTH     Performed at [facility]   Culture     Final   Value: NO GROWTH     Performed at [facility]   Report Status 12/12/2013 FINAL   Final  CULTURE, BLOOD (ROUTINE X 2)     Status: None   Collection Time    12/11/13  7:20 PM      Result Value Ref Range Status   Specimen Description BLOOD RIGHT HAND   Final   Special Requests BOTTLES DRAWN AEROBIC ONLY [facility]   Final   Culture  Setup Time     Final   Value: 12/12/2013 00:23     Performed at [facility]   Culture     Final   Value: NO GROWTH 5 DAYS     Performed at [facility]   Report Status 12/18/2013 FINAL   Final  CULTURE, BLOOD (ROUTINE X 2)     Status: None   Collection Time    12/11/13  7:30 PM      Result Value Ref Range Status   Specimen Description BLOOD LEFT HAND   Final   Special Requests BOTTLES DRAWN AEROBIC ONLY 3.5CC   Final   Culture  Setup Time     Final   Value: 12/12/2013 00:23     Performed at [facility]   Culture     Final   Value: NO GROWTH 5 DAYS     Performed at [facility]   Report Status 12/18/2013 FINAL   Final  WOUND CULTURE     Status: None   Collection  Time    12/15/13  9:34 AM      Result Value Ref Range Status   Specimen Description WOUND ASPIRATE BACK   Final   Special Requests DISC SPACE L 3 4   Final   Gram Stain     Final   Value: NO WBC SEEN     NO SQUAMOUS EPITHELIAL CELLS SEEN     NO ORGANISMS SEEN     Performed at [facility]   Culture     Final   Value: NO GROWTH 2 DAYS     Performed at [facility]   Report Status 12/17/2013 FINAL   Final   Medical History: Past Medical History  Diagnosis Date  . Cancer   . Lymphoma   . HIV disease   . Cirrhosis   . PTSD (post-traumatic stress disorder)   . Depressive disorder    Medications:  Anti-infectives   Start     Dose/Rate Route Frequency Ordered Stop   12/18/13 0800  atovaquone (MEPRON) 750 MG/5ML suspension 1,500 mg     1,500 mg Oral Daily with breakfast 12/17/13 1606     12/15/13 1300  DAPTOmycin (CUBICIN) 700 mg in sodium chloride 0.9 % IVPB     700 mg 228 mL/hr over 30 Minutes Intravenous Every 24 hours 12/15/13 1226     12/15/13 1000  azithromycin (ZITHROMAX) tablet 1,200 mg     1,200 mg Oral Every Fri 12/10/13 2050     12/11/13 1000  atovaquone (MEPRON) 750 MG/5ML suspension 1,500 mg  Status:  Discontinued     1,500 mg Oral Daily 12/10/13 2050 12/14/13 0953   12/11/13 0800  Darunavir Ethanolate (PREZISTA) tablet 800 mg     800 mg Oral Daily with breakfast 12/10/13 2050     12/11/13 0800  ritonavir (NORVIR) capsule 100 mg  100 mg Oral Daily with breakfast 12/10/13 2050     12/10/13 2200  emtricitabine-tenofovir (TRUVADA) 200-300 MG per tablet 1 tablet     1 tablet Oral Daily at bedtime 12/10/13 2050       Assessment: 38 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  He has had multiple infections including VRE bacteremia as well as enterococcus.  He also has had candidemia.  He has had an MRI and now with vertebral osteomyelitis and discitis.  Pt well known to pharmacy from Daptomycin dosing this admission. ID MD now wishes to add  Ceftazidime to cover GNR.  Currently his creatinine is 0.93 with an estimated crcl of >166ml/min.  WBC remains low 2.1. Afebrile.  Goal of Therapy:  Resolution of infection  Plan:  1. Ceftazidime 2gm IV q8h. 2. Continue Daptomycin 700mg  (8mg /kg) daily 3.  Will f/u clinical response and renal function 4. Will need weekly CK with daptomycin  Sherlon Handing, PharmD, BCPS Clinical pharmacist, pager [phone] 12/18/2013,9:47 AM

## 2013-12-18 NOTE — Progress Notes (Signed)
[facility] for Infectious Disease  Day #4 daptomycin  Subjective: Still complaining of back pain foot pain and wrist pain that he thinks is due to gout his upset that the colchicine seen has been stopped   Antibiotics:  Anti-infectives   Start     Dose/Rate Route Frequency Ordered Stop   12/18/13 1000  cefTAZidime (FORTAZ) 2 g in dextrose 5 % 50 mL IVPB     2 g 100 mL/hr over 30 Minutes Intravenous 3 times per day 12/18/13 0953     12/18/13 0800  atovaquone (MEPRON) 750 MG/5ML suspension 1,500 mg     1,500 mg Oral Daily with breakfast 12/17/13 1606     12/18/13 0000  azithromycin (ZITHROMAX) 600 MG tablet     1,200 mg Oral Every Fri 12/18/13 1005     12/18/13 0000  cefTAZidime 2 g in dextrose 5 % 50 mL     2 g 100 mL/hr over 30 Minutes Intravenous Every 8 hours 12/18/13 1005     12/18/13 0000  DAPTOmycin 700 mg in sodium chloride 0.9 % 100 mL     700 mg 228 mL/hr over 30 Minutes Intravenous Every 24 hours 12/18/13 1005     12/15/13 1300  DAPTOmycin (CUBICIN) 700 mg in sodium chloride 0.9 % IVPB     700 mg 228 mL/hr over 30 Minutes Intravenous Every 24 hours 12/15/13 1226     12/15/13 1000  azithromycin (ZITHROMAX) tablet 1,200 mg     1,200 mg Oral Every Fri 12/10/13 2050     12/11/13 1000  atovaquone (MEPRON) 750 MG/5ML suspension 1,500 mg  Status:  Discontinued     1,500 mg Oral Daily 12/10/13 2050 12/14/13 0953   12/11/13 0800  Darunavir Ethanolate (PREZISTA) tablet 800 mg     800 mg Oral Daily with breakfast 12/10/13 2050     12/11/13 0800  ritonavir (NORVIR) capsule 100 mg     100 mg Oral Daily with breakfast 12/10/13 2050     12/10/13 2200  emtricitabine-tenofovir (TRUVADA) 200-300 MG per tablet 1 tablet     1 tablet Oral Daily at bedtime 12/10/13 2050        Medications: Scheduled Meds: . atovaquone  1,500 mg Oral Q breakfast  . azithromycin  1,200 mg Oral Q Fri  . cefTAZidime (FORTAZ)  IV  2 g Intravenous 3 times per day  . colchicine  0.6 mg Oral Daily    . cyclobenzaprine  5 mg Oral QHS  . DAPTOmycin (CUBICIN)  IV  700 mg Intravenous Q24H  . Darunavir Ethanolate  800 mg Oral Q breakfast  . docusate sodium  100 mg Oral BID  . emtricitabine-tenofovir  1 tablet Oral QHS  . feeding supplement (RESOURCE BREEZE)  1 Container Oral BID PC  . furosemide  40 mg Oral BID  . gabapentin  300 mg Oral TID  . lactose free nutrition  237 mL Oral BID  . morphine  15 mg Oral BID  . pantoprazole  40 mg Oral BID  . phytonadione  10 mg Oral Daily  . ritonavir  100 mg Oral Q breakfast  . sodium chloride  3 mL Intravenous Q12H  . sucralfate  1 g Oral TID WC   Continuous Infusions:  PRN Meds:.acetaminophen, HYDROmorphone (DILAUDID) injection, ondansetron (ZOFRAN) IV, sodium chloride    Objective: Weight change:   Intake/Output Summary (Last 24 hours) at 12/18/13 1425 Last data filed at 12/18/13 1413  Gross per 24 hour  Intake   1080  ml  Output   3550 ml  Net  -2470 ml   Blood pressure 105/55, pulse 90, temperature 97.6 F (36.4 C), temperature source Axillary, resp. rate 18, height 5\' 9"  (1.753 m), weight 195 lb (88.451 kg), SpO2 100.00%. Temp:  [97.6 F (36.4 C)-98.2 F (36.8 C)] 97.6 F (36.4 C) (07/13 1415) Pulse Rate:  [90-94] 90 (07/13 1415) Resp:  [18-20] 18 (07/13 1415) BP: (101-105)/(49-55) 105/55 mmHg (07/13 1415) SpO2:  [100 %] 100 % (07/13 1415)  Physical Exam: General: Alert and awake, oriented x3, pale coarse voice and clearly in pain  HEENT: anicteric sclera, pupils reactive to light and accommodation, EOMI, oropharynx clear and without exudate  CVS regular rate, normal r, no murmur rubs or gallops  Chest: clear to auscultation bilaterally, no wheezing, rales or rhonchi  Abdomen: soft nontender, nondistended, normal bowel sounds,  Extremities: tenderness in big toe Neuro: No focal deficits but has severe pain with trying to raise his right leg in particular  CBC:  Recent Labs Lab 12/14/13 0325 12/15/13 0605  12/16/13 0328 12/17/13 0620 12/18/13 0532  HGB 9.5* 9.7* 8.9* 8.4* 8.9*  HCT 26.8* 27.2* 25.6* 23.4* 25.2*  PLT 84* 77* 63* 55* 61*  INR  --  1.50*  --   --   --      BMET  Recent Labs  12/17/13 0620 12/18/13 0532  NA 129* 130*  K 5.2 5.4*  CL 95* 96  CO2 25 26  GLUCOSE 88 96  BUN 18 19  CREATININE 0.94 0.93  CALCIUM 8.6 9.2     Liver Panel  No results found for this basename: PROT, ALBUMIN, AST, ALT, ALKPHOS, BILITOT, BILIDIR, IBILI,  in the last 72 hours     Sedimentation Rate No results found for this basename: ESRSEDRATE,  in the last 72 hours C-Reactive Protein No results found for this basename: CRP,  in the last 72 hours  Micro Results: Recent Results (from the past 720 hour(s))  AFB CULTURE WITH SMEAR     Status: None   Collection Time    12/11/13 12:44 PM      Result Value Ref Range Status   Specimen Description WOUND   Final   Special Requests LUMBAR 4 AND 5 DISC   Final   Acid Fast Smear     Final   Value: NO ACID FAST BACILLI SEEN     Performed at [facility]   Culture     Final   Value: CULTURE WILL BE EXAMINED FOR 6 WEEKS BEFORE ISSUING A FINAL REPORT     Performed at [facility]   Report Status PENDING   Incomplete  FUNGUS CULTURE W SMEAR     Status: None   Collection Time    12/11/13 12:44 PM      Result Value Ref Range Status   Specimen Description WOUND   Final   Special Requests LUMBAR 4 AND 5 DISC   Final   Fungal Smear     Final   Value: NO YEAST OR FUNGAL ELEMENTS SEEN     Performed at [facility]   Culture     Final   Value: CULTURE IN PROGRESS FOR FOUR WEEKS     Performed at [facility]   Report Status PENDING   Incomplete  CULTURE, ROUTINE-ABSCESS     Status: None   Collection Time    12/11/13 12:44 PM      Result Value Ref Range Status   Specimen  Description ABSCESS BACK   Final   Special Requests L4 L5 DISC ASPIRATE   Final   Gram Stain     Final   Value: NO WBC SEEN     NO  SQUAMOUS EPITHELIAL CELLS SEEN     NO ORGANISMS SEEN     Performed at [facility]   Culture     Final   Value: NO GROWTH 3 DAYS     Performed at [facility]   Report Status 12/15/2013 FINAL   Final  URINE CULTURE     Status: None   Collection Time    12/11/13  5:14 PM      Result Value Ref Range Status   Specimen Description URINE, CLEAN CATCH   Final   Special Requests NONE   Final   Culture  Setup Time     Final   Value: 12/11/2013 18:43     Performed at [place]     Final   Value: NO GROWTH     Performed at [facility]   Culture     Final   Value: NO GROWTH     Performed at [facility]   Report Status 12/12/2013 FINAL   Final  CULTURE, BLOOD (ROUTINE X 2)     Status: None   Collection Time    12/11/13  7:20 PM      Result Value Ref Range Status   Specimen Description BLOOD RIGHT HAND   Final   Special Requests BOTTLES DRAWN AEROBIC ONLY [facility]   Final   Culture  Setup Time     Final   Value: 12/12/2013 00:23     Performed at [facility]   Culture     Final   Value: NO GROWTH 5 DAYS     Performed at [facility]   Report Status 12/18/2013 FINAL   Final  CULTURE, BLOOD (ROUTINE X 2)     Status: None   Collection Time    12/11/13  7:30 PM      Result Value Ref Range Status   Specimen Description BLOOD LEFT HAND   Final   Special Requests BOTTLES DRAWN AEROBIC ONLY 3.5CC   Final   Culture  Setup Time     Final   Value: 12/12/2013 00:23     Performed at [facility]   Culture     Final   Value: NO GROWTH 5 DAYS     Performed at [facility]   Report Status 12/18/2013 FINAL   Final  WOUND CULTURE     Status: None   Collection Time    12/15/13  9:34 AM      Result Value Ref Range Status   Specimen Description WOUND ASPIRATE BACK   Final   Special Requests DISC SPACE L 3 4   Final   Gram Stain     Final   Value: NO WBC SEEN     NO SQUAMOUS EPITHELIAL CELLS SEEN     NO  ORGANISMS SEEN     Performed at [facility]   Culture     Final   Value: NO GROWTH 2 DAYS     Performed at [facility]   Report Status 12/17/2013 FINAL   Final    Studies/Results: No results found.    Assessment/Plan:  Active Problems:   AIDS   Large cell lymphoma   Cirrhosis, non-alcoholic   Protein-calorie malnutrition,  severe   Discitis   Malnutrition of moderate degree    Samuel Schultz is a 38 y.o. 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 He got a dose of vancomycin and another antibiotic he thinks at [facility] unfortunately prior to transfer here. He's undergone I are guided biopsy cx negative to date  He has toe pain and now plain films c/w degnerative disease. Dr. Sharol Given has seen pt and believes he may have gout vs pseudogout.MRI suggestive of gout.  #1 osteomyelitis with vertebral infection and discitis:   --Culture the been taken a second time and still NO GROWTH  and we have started daptomycin   --I am most suspicious for AMP sensitive ENterococcus and think that the VRE and Candida Could have been contaminants that we saw before (though his family do not want to risk of not treating them.)  Will add CEFTAZ on the chance he could have had a pseudomonas or GNR --  Treat with 8 weeks of daptomycin and at least 6 weeks of ceftazidime  He will need weekly cbc, bmp and CK faxed to me at [phone]. Pharmacist at SNF will need to take joint ownership over these abx with me  #2 Toe pain:  Possible gout ok to restart colchicine  has chronic pancytopenia I don't think that the colchicine is playing a role here.   #3 HIV/AIDS: continue as Prezista Norvir Truvada, and prophylactic antibiotics.   I'll arrange for hospital followup in the next month with [place].  Please call with further questions.       LOS: 8 days   Alcide Evener 12/18/2013, 2:25 PM

## 2013-12-19 ENCOUNTER — Other Ambulatory Visit: Payer: Self-pay | Admitting: *Deleted

## 2013-12-19 ENCOUNTER — Inpatient Hospital Stay
Admission: RE | Admit: 2013-12-19 | Discharge: 2014-01-01 | Disposition: A | Discharge status: Nursing Home (Includes ICF) | Payer: Medicaid Other | Source: Ambulatory Visit | Attending: Internal Medicine | Admitting: Internal Medicine

## 2013-12-19 DIAGNOSIS — B20 Human immunodeficiency virus [HIV] disease: Secondary | ICD-10-CM

## 2013-12-19 DIAGNOSIS — R52 Pain, unspecified: Principal | ICD-10-CM

## 2013-12-19 LAB — BASIC METABOLIC PANEL
Anion gap: 9 (ref 5–15)
Anion gap: 10 (ref 5–15)
BUN: 19 mg/dL (ref 6–23)
BUN: 16 mg/dL (ref 6–23)
CHLORIDE: 97 meq/L (ref 96–112)
CHLORIDE: 93 meq/L — AB (ref 96–112)
CO2: 25 mEq/L (ref 19–32)
CO2: 25 mEq/L (ref 19–32)
CREATININE: 0.95 mg/dL (ref 0.50–1.35)
Calcium: 9.5 mg/dL (ref 8.4–10.5)
Calcium: 9.5 mg/dL (ref 8.4–10.5)
Creatinine, Ser: 0.93 mg/dL (ref 0.50–1.35)
GFR calc Af Amer: >90 mL/min (ref >90)
GFR calc non Af Amer: >90 mL/min (ref >90)
Glucose, Bld: 119 mg/dL — ABNORMAL HIGH (ref 70–99)
Glucose, Bld: 109 mg/dL — ABNORMAL HIGH (ref 70–99)
POTASSIUM: 5.2 meq/L (ref 3.7–5.3)
Potassium: 5.6 mEq/L — ABNORMAL HIGH (ref 3.7–5.3)
Sodium: 131 mEq/L — ABNORMAL LOW (ref 137–147)
Sodium: 128 mEq/L — ABNORMAL LOW (ref 137–147)

## 2013-12-19 LAB — CBC
HEMATOCRIT: 25.2 % — AB (ref 39.0–52.0)
Hemoglobin: 8.9 g/dL — ABNORMAL LOW (ref 13.0–17.0)
MCH: 36.9 pg — AB (ref 26.0–34.0)
MCHC: 35.3 g/dL (ref 30.0–36.0)
MCV: 104.6 fL — ABNORMAL HIGH (ref 78.0–100.0)
Platelets: 53 10*3/uL — ABNORMAL LOW (ref 150–400)
RBC: 2.41 MIL/uL — ABNORMAL LOW (ref 4.22–5.81)
RDW: 15.5 % (ref 11.5–15.5)
WBC: 2.1 10*3/uL — AB (ref 4.0–10.5)

## 2013-12-19 LAB — CK: Total CK: 32 U/L (ref 7–232)

## 2013-12-19 MED ORDER — DARUNAVIR ETHANOLATE 800 MG PO TABS: 800.0000 mg | ORAL_TABLET | Freq: Every day | ORAL | Status: DC

## 2013-12-19 MED ORDER — ATOVAQUONE 750 MG/5ML PO SUSP: 1500.0000 mg | Freq: Every day | ORAL | Status: DC

## 2013-12-19 MED ORDER — METHOCARBAMOL 500 MG PO TABS
500.0000 mg | ORAL_TABLET | Freq: Four times a day (QID) | ORAL | Status: DC | PRN
  Administered 2013-12-19: 500 mg via ORAL
  Filled 2013-12-19: qty 1

## 2013-12-19 MED ORDER — RITONAVIR 100 MG PO CAPS: 100.0000 mg | ORAL_CAPSULE | Freq: Every day | ORAL | Status: DC

## 2013-12-19 MED ORDER — METHOCARBAMOL 500 MG PO TABS: 500.0000 mg | ORAL_TABLET | Freq: Four times a day (QID) | ORAL | Status: DC | PRN

## 2013-12-19 MED ORDER — EMTRICITABINE-TENOFOVIR DF 200-300 MG PO TABS: 1.0000 | ORAL_TABLET | Freq: Every day | ORAL | Status: DC

## 2013-12-19 MED ORDER — HEPARIN SOD (PORK) LOCK FLUSH 100 UNIT/ML IV SOLN
250.0000 [IU] | Freq: Every day | INTRAVENOUS | Status: DC
  Filled 2013-12-19: qty 3

## 2013-12-19 MED ORDER — HEPARIN SOD (PORK) LOCK FLUSH 100 UNIT/ML IV SOLN
250.0000 [IU] | INTRAVENOUS | Status: DC | PRN
  Administered 2013-12-19: 250 [IU]
  Filled 2013-12-19: qty 3

## 2013-12-19 MED ORDER — SODIUM POLYSTYRENE SULFONATE 15 GM/60ML PO SUSP
45.0000 g | Freq: Once | ORAL | Status: DC
  Filled 2013-12-19: qty 180

## 2013-12-19 MED ORDER — MORPHINE SULFATE ER 15 MG PO TBCR: EXTENDED_RELEASE_TABLET | ORAL | Status: DC

## 2013-12-19 NOTE — Care Management Note (Signed)
  Page 2 of 2   12/19/2013     2:56:04 PM CARE MANAGEMENT NOTE 12/19/2013  Patient:  Samuel Schultz, Samuel Schultz   Account Number:  [id number]  Date Initiated:  12/19/2013  Documentation initiated by:  Magdalen Spatz  Subjective/Objective Assessment:     Action/Plan:   Anticipated DC Date:     Anticipated DC Plan:           Choice offered to / List presented to:             Status of service:   Medicare Important Message given?   (If response is "NO", the following Medicare IM given date fields will be blank) Date Medicare IM given:   Medicare IM given by:   Date Additional Medicare IM given:   Additional Medicare IM given by:    Discharge Disposition:    Per UR Regulation:    If discussed at Long Length of Stay Meetings, dates discussed:   12/19/2013    Comments:  12-19-13 Nine day supply of Truvada , Norvic , Atrvaquone , and Previta, picked up from main pharmacy and given to bedside nurse Armen Pickup . [facility] [place] aware and arranging transport. Magdalen Spatz RN BSN     12-19-13  [facility] requesting 6 weeks worth of patient's 042 medications  Truvada , Norvic , Atrvaquone , and  Previta  before they will accept him .  [place] had prescriptions filled at [facility] [phone] . Spoke to [place] at [facility] , prescriptions filled December 06, 2013 , therefore patient should have 9 days worth left at [facility] .  Langley Gauss at [facility] faxed prescriptions and Medicaid information to [place] on [address] , however, unable to fill prescriptions because just filled on July 1 .   Spoke with Sanjuan Dame at [facility] , she is currently not at [facility] but will call NCM back when she is, and see if she can ship medications to [facility] .  Spoke with Laverta Baltimore at [facility] , explained above and that [facility] needs medications today . Laverta Baltimore will fill prescriptions 9 days worth and after that , fill  prescriptions through Medicaid .  Langley Gauss will fax prescriptions directly to [place] .  Paged Dr Eliseo Squires to ask for prescriptions for 9 day supply of each medication. Dr Eliseo Squires wrote prescriptions , same sent to main pharmacy .  Magdalen Spatz RN BSN

## 2013-12-19 NOTE — Progress Notes (Signed)
Pt is ready for discharge to [facility] but is currently waiting on non-emergent ambulance for transport. Pt's PICC line has been disconnected by IV team and pt has a 9 day supply of medications that will transfer with pt. Pt is aware that he is going to [facility] this evening but doesn't like that he is traveling at night. Pt is currently in the bed resting, appears to be sleeping. No signs of any pain or distress noted at this time.

## 2013-12-19 NOTE — Telephone Encounter (Signed)
[facility] 

## 2013-12-19 NOTE — Progress Notes (Signed)
Patient is awaiting [facility]- med list updated Having some spasms- added robaxin Needs long term abx- will be d/c'd with abx  Eulogio Bear DO

## 2013-12-19 NOTE — Progress Notes (Signed)
PT Cancellation Note  Patient Details Name: Samuel Schultz MRN: [id number] DOB: 1976-05-13   Cancelled Treatment:    Reason Eval/Treat Not Completed: Patient declined, hurting too much. 12/19/2013  Donnella Sham, [place] [phone]  (pager)   Lyriq Jarchow, Tessie Fass 12/19/2013, 1:51 PM

## 2013-12-19 NOTE — Progress Notes (Signed)
Pt tx to [facility] via [place].  Pt agreable to tx.  Pt's contact, Rosaria Ferries, notified of tx by telephone.

## 2013-12-20 ENCOUNTER — Non-Acute Institutional Stay (SKILLED_NURSING_FACILITY): Payer: Medicaid Other | Admitting: Internal Medicine

## 2013-12-20 DIAGNOSIS — M519 Unspecified thoracic, thoracolumbar and lumbosacral intervertebral disc disorder: Secondary | ICD-10-CM

## 2013-12-20 DIAGNOSIS — E236 Other disorders of pituitary gland: Secondary | ICD-10-CM

## 2013-12-20 DIAGNOSIS — M4647 Discitis, unspecified, lumbosacral region: Secondary | ICD-10-CM

## 2013-12-20 DIAGNOSIS — K7460 Unspecified cirrhosis of liver: Secondary | ICD-10-CM

## 2013-12-20 DIAGNOSIS — E222 Syndrome of inappropriate secretion of antidiuretic hormone: Secondary | ICD-10-CM

## 2013-12-21 ENCOUNTER — Other Ambulatory Visit: Payer: Self-pay | Admitting: *Deleted

## 2013-12-21 MED ORDER — MORPHINE SULFATE 15 MG PO TABS: ORAL_TABLET | ORAL | Status: DC

## 2013-12-21 NOTE — Telephone Encounter (Signed)
[facility] 

## 2013-12-25 NOTE — Progress Notes (Addendum)
Patient ID: Samuel Schultz, male   DOB: 1975/09/25, 38 y.o.   MRN: [id number]               HISTORY & PHYSICAL  DATE:  12/20/2013    FACILITY: [facility]    LEVEL OF CARE:   SNF   CHIEF COMPLAINT:  Admission to SNF, post stay at [facility], 12/10/2013 through 12/19/2013.    HISTORY OF PRESENT ILLNESS:  This is a 38 year-old man who was hospitalized 11/06/2013 through 11/22/2013 for candida albicans fungemia, treated with 14 days of antifungal therapy and was continued on Fluconazole through 11/29/2013.  He was also treated for VRE bacteremia.    TEE on 11/13/2013 was negative for vegetation or thrombus.  Surveillance blood cultures obtained on 11/17/2013 were negative.  He continued on Cubicin and Fluconazole through 11/29/2013, which was 14 days after his last culture.      During the same admission, he started to complain of right lower back pain.  An MRI was negative.  He was put on analgesics and skeletal muscle relaxants.  The patient became unable to ambulate because of progressive weakness and pain in his back.  He had an MRI done at [facility] that showed progressive L3/L4 diskitis and osteomyelitis, but no abscess.   He was admitted with a white count of 6.8, a sodium of 121.  He has cirrhosis of the liver and has auto-anticoagulation and he is on chronic vitamin K replacement.    Culture taken from his L3/L4 site  apparently was negative.  He has been on daptomycin.  He was also treated with ceftazidine.   Both of these antibiotics are to continue for six weeks.  The ceftazidine was on the chance that he had gram-negative infection although his cultures of the disk were negative, I think two times.    PAST MEDICAL HISTORY/PROBLEM LIST:  Actually very extensive and includes:    HIV/AIDS.    On Prezista, Norvir, and Truvada.  He is also on prophylactic antibiotics.     History of advanced large cell lymphoma.  Apparently infiltrative of his liver.  The exact status of  this is not totally clear.    Cirrhosis, non-alcoholic.  Felt to be secondary to infiltrative lymphoma.    Pancytopenia.  Related presumably to portal hypertension and chronic coagulopathy.    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  Improved with Lasix along with fluid restriction.    Mild hyperkalemia.  Stopped aldactone and was given Kayexalate x1.    CURRENT MEDICATIONS:  Discharge medications are extensive and include:    Mepron 1500 daily suspension.    Azithromycin 600, 2 tablets/1200 mg every Friday longstanding.    Ceftazidine 2 g every 8 hours.    Colchicine 0.6 daily.    Flexeril 5 mg at bedtime.    Daptomycin 700 mg daily.    Truvada 1 tablet at bedtime.    Lactose free nutrition 237 b.i.d.    Lasix 40 mg two times daily.    Neurontin 300 three times daily.    Robaxin 500 every 6 hours p.r.n.     Morphine/MS Contin 15 mg two times a day.    Protonix 40 b.i.d.    Vitamin K 10 daily.    Prezista 800 mg daily.    Norvir 100 mg daily.    Sucralfate 1 g three times daily.    REVIEW OF SYSTEMS:   GENERAL:  He has no fever or chills.  CHEST/RESPIRATORY:  No shortness of breath.   GI:  No diarrhea or abdominal pain.   MUSCULOSKELETAL:  He is complaining of severe low back pain.  Tells me he was on Dilaudid p.r.n. IV every 3 hours prior to his transfer here.  He is on only MS Contin and I am not even sure that has arrived in the facility.    PHYSICAL EXAMINATION:   GENERAL APPEARANCE:  The patient looks upset, predominantly about the narcotics.  He also claims he was not told about the fluid restriction.   CHEST/RESPIRATORY:  Clear air entry bilaterally.   CARDIOVASCULAR:  CARDIAC:   Heart sounds are normal.  He appears to be euvolemic.   GASTROINTESTINAL:  LIVER/SPLEEN/KIDNEYS:  No liver, no spleen palpable.   LYMPHATICS:  None palpable in the cervical or clavicular areas.  There are small, soft nodes in the left axilla.   SKIN:  INSPECTION:  He  has a petechial-looking rash in both legs.    NEUROLOGICAL:    DEEP TENDON REFLEXES:  Reflexes are present at the knee jerks and ankle jerks.   SENSATION/STRENGTH:  He has good strength in the right leg.  He has a lot of problem lifting the left leg due to pain in his lower back.    ASSESSMENT/PLAN:  L3/L4 diskitis.  He is in a lot of pain because of this.  To be fair to him, he probably has not had any prn narcotics.  He is only on MS Contin 15 mg twice a day.  He will continue his ceftazidine and daptomycin as directed.  Follow-up labs ordered  SIADH.   I have not looked through his evaluation for this.  I will repeat his lab work.    Petechial-rash in his legs.  This is fairly faint at the moment, although this will need to be monitored.    Cirrhosis of the liver/lymphoma.  I have no information on this currently.  The patient states he thinks this is in remission.    Acute gout.  Thought to be present in his left foot.  This seems to involve the first, second, and third metatarsophalangeal joints.  He tells me the doctors thought this might be a septic arthritis, although an MRI suggested gout and he was treated accordingly.  He says this has gotten progressively worse, although it did improve on whatever treatment was ordered.     The patient comes with several issues, absence of adequate narcotic analgesia.  I am not planning to give him Dilaudid, although something p.r.n. probably is in order.  I will recheck his electrolytes, his platelet count tomorrow.   The petechial rash in his legs will need to be carefully monitored.

## 2013-12-26 ENCOUNTER — Non-Acute Institutional Stay (SKILLED_NURSING_FACILITY): Payer: Medicaid Other | Admitting: Internal Medicine

## 2013-12-26 DIAGNOSIS — R509 Fever, unspecified: Secondary | ICD-10-CM

## 2013-12-26 NOTE — Progress Notes (Signed)
Patient ID: Samuel Schultz, male   DOB: 09-16-1975, 38 y.o.   MRN: [id number]                                                           . This is an acute visit  Level of care skilled.  Facility [facility].  Chief complaint-acute visit secondary to low grade temperature.  History of present illness.  Patient is a 38 year old male with a complicated medical history including diffuse large B-cell lymphoma stage IV-history of of HIV  He was hospitalized with severe diarrhea and pancytopenia.  -treatedd forr fungemia that was treated with antifungal therapy also for VRE bacteremia enterococcus  TEE was negative for vegetation or thrombus-surveillance blood cultures were negative-he was continued on Unasyn and fluconazole through late June.  He also complained of right-sided lower back pain and leg pain MRI was negative for discitis however he progressively got weaker with chills and rigors-unable to ambulate because of weakness and pain in his back MRI then showed new progressive L3-L4 discitis osteomyelitis but no abscess.  His white count was 6.8 sodium 121.  .  In regards to the osteomyelitis discitis-he was started on daptomycin-ceftaz was started for chance he could have Pseudomonas  In regards to HIV AIDS he continues on his antivirals--Presista,norvir,Truvada--I see recommendation to restart Mepron on discharge summary however patient says he has been off this long term since the cause significant vomiting.  He does have a history of nonalcoholic cirrhosis 22 infiltrative lymphoma again with a history of severe pancytopenia and coagulopathy.  Apparently this morning he did have a temperature reading of 100.1-he denies any fever chills dysuria shortness of breath or cough.  He did state he did have an episode of diarrhea but denies any abdominal pain.  Family medical social history as been reviewed per admission note on 12/20/2013 and discharge note 12/19/2013.  Medications  have been reviewed per MAR.  Review of systems.  In general denies any fever or chills.  Skin does not complaining of rash or itching.  Respiratory denies any shortness of breath or cough.  Cardiac does not complaining of chest pain does not appear to have significant lower extremity edema.  GI did have an episode of diarrhea this morning denies any abdominal pain there has been no vomiting or nausea noted.  GU denies any dysuria.  Muscle skeletal has significant history of back pain but apparently medications currently are effective.  Neurologic does not complaining of any headache or dizziness  Physical exam.  Currently he is afebrile again had a temperature of 100.1 earlier today pulse is 90 respirations of 18 blood pressure taken manually 110/58.  In general this is a fairly well-nourished young male in no distress.  His skin is warm and dry--PIC line  On right upper arm appears unremarkable without surrounding erythema or drainage.  Chest is clear to auscultation there is no labored breathing.  Heart is regular rate and rhythm without murmur gallop or rub  He does not have significant lower extremity edema.  Abdomen is soft nontender with positive bowel sounds.  GU could not appreciate any suprapubic tenderness there was no drainage from the penis.  Muscle skeletal moves all extremities x4 do not note any deformities.  Neurologic is grossly intact speech is clear.  Psych he is  alert and oriented x3 pleasant and appropriate.  Labs.  12/25/2013.  Sodium 132 potassium 3.6 BUN 11 creatinine 0.71.  12/20/2013.  WBC 2.3 hemoglobin 9.4 platelets 53,000.  CK level was 29.   . Assessment and plan.  #1-low grade temperature-there could be numerous etiologies infectious disease has been contacted we will await their input-he does not appear to be overtly septic certainly denies any shortness of breath cough dysuria--- or overtly feeling like he has a fever chills  -- this could be actually a thermometer variation as well but we'll have to keep a close eye on this--clinically he appears to be at baseline.  He does have some diarrhea so will order C. difficile cultures as well-continue to monitor  [id number]  .  Addendum-of note nursing has spoken with infectious disease-labs will be drawn tomorrow -they are following him closely-labs are routinely ordered q. weekly with review by infectious disease  .

## 2013-12-27 ENCOUNTER — Non-Acute Institutional Stay (SKILLED_NURSING_FACILITY): Payer: Medicaid Other | Admitting: Internal Medicine

## 2013-12-27 DIAGNOSIS — M519 Unspecified thoracic, thoracolumbar and lumbosacral intervertebral disc disorder: Secondary | ICD-10-CM

## 2013-12-27 DIAGNOSIS — D61818 Other pancytopenia: Secondary | ICD-10-CM

## 2013-12-27 DIAGNOSIS — M4647 Discitis, unspecified, lumbosacral region: Secondary | ICD-10-CM

## 2013-12-29 ENCOUNTER — Encounter: Payer: Self-pay | Admitting: Internal Medicine

## 2013-12-29 ENCOUNTER — Non-Acute Institutional Stay (SKILLED_NURSING_FACILITY): Payer: Medicaid Other | Admitting: Internal Medicine

## 2013-12-29 ENCOUNTER — Telehealth: Payer: Self-pay | Admitting: Infectious Diseases

## 2013-12-29 DIAGNOSIS — D61818 Other pancytopenia: Secondary | ICD-10-CM

## 2013-12-29 DIAGNOSIS — R197 Diarrhea, unspecified: Secondary | ICD-10-CM

## 2013-12-29 DIAGNOSIS — M4646 Discitis, unspecified, lumbar region: Secondary | ICD-10-CM

## 2013-12-29 DIAGNOSIS — E236 Other disorders of pituitary gland: Secondary | ICD-10-CM

## 2013-12-29 DIAGNOSIS — E222 Syndrome of inappropriate secretion of antidiuretic hormone: Secondary | ICD-10-CM

## 2013-12-29 DIAGNOSIS — M519 Unspecified thoracic, thoracolumbar and lumbosacral intervertebral disc disorder: Secondary | ICD-10-CM

## 2013-12-29 NOTE — Telephone Encounter (Signed)
Samuel Schultz, I think this Dude has had chronic leukopenia before meds unless it is substantially worse

## 2013-12-29 NOTE — Telephone Encounter (Signed)
Pt with HIV, osteomyelitis. Having worsening WBC on Ceftaz and Dapto. Will continue his meds for now, have him seen in ID clinic next week.

## 2013-12-29 NOTE — Progress Notes (Signed)
Patient ID: Samuel Schultz, male   DOB: 10/06/1975, 38 y.o.   MRN: [id number]   this is an acute visit.  LOC-skilled.  Facility [facility].  Chief complaint acute visit followup leukopenia-pancytopenia-diarrhea-.  History of present illness.  Patient is a 38 year old male with a complicated medical history-he was hospitalized in early June for fungemia treated with antifungal therapy.  Also treated for VRE bacteremia.  He complained of low back pain and was discovered to have lumbar discitis osteomyelitis-he had been on daptomycin this was continued also treated with ceftazidine--.  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  He also has a history of HIV-AIDS on Norvir and Truvada as well as Prezita....  This is complicated with a history of cirrhosis felt to be secondary to infiltrative lymphoma-with pancytopenia and chronic coagulopathy.  Earlier this week it was noted his white count had dropped to 11.2-it is covered in this range most of the week.  Hemoglobin has hovered around 8 it was 7.5 on Lab done today ~  This week he did develop diarrhea and Lasix was put on hold--he was also given IV fluids-and colchicine was discontinued he does have a history of gout-- he is also supplemented with potassium labs today showed a potassium 4.1 which is improved from 3.6 earlier this week.  He says the diarrhea has resolved as of late Wednesday-says he feels a bit better than he did earlie r this week-.  Of note he was started on Flagyl earlier this week for empiric purposes-a C. difficile culture was obtained-this ihascome back negative  However there is concern with his low white count and somewhat diminished hemoglobin from baseline-.  I did discuss this with Dr. Dellia Nims via phone and also with infectious disease Dr. Steele Sizer labs are being faxed to infectious disease for followup-apparently has appointment with them also  will be moved up.  Family medical social history as been reviewed per admission note on 12/20/2013 as well as discharge on 12/19/2013.  Medications have been reviewed per MAR.  Review of systems.  In general denies fever or chills says he feels better than he did 2 days ago still weak.  Head ears eyes nose mouth and throat is not complaining of any visual changes or sore throat.  Respiratory does not complain of shortness of breath or cough.  Cardiac does not complaining of chest pain has some mild lower extremity edema.  GI does not complaining of abdominal discomfort he says the diarrhea has resolved there's been no nausea or vomiting noted.  Muscle skeletal continues to complain at times of pain most Leeann is back apparently pain medication is helping.  Neurologic is not complaining of dizziness headache or numbness.  Psych does have a history of anxiety as well as depression he is on Ativan when necessary as well as Wellbutrin routinely.  Physical exam.  Temperature is 97.7 pulse 88 respirations 19 blood pressure 110/60 taken manually it appears by machine systolics often run in the 80s-90s area  \In general this is a well-nourished male who does not appear to be in any distress he is sleeping but easily arousable.  The skin is warm and dry he is pale.  Chest is clear to auscultation there is no labored breathing.  Heart is regular rate and rhythm without murmur gallop or rub.  Abdomen is soft does not appear to have acute tenderness to palpation there are active bowel sounds it is nondistended.  Muscle skeletal Limited exam patient is in bed but he does move all extremities x4 is able to turn without difficulty although does have back pain with movement I cannot appreciate any deformity of his back.  Neurologic is grossly intact speech is clear.  Psych he is alert and oriented pleasant and appropriate.  Labs.  12/29/2013.  WBC 1.1 hemoglobin 7.5 platelets  50,000-absolute granulocytes reduced at 0.5 absolute lymphs reduced at 0.4.  Sodium 139 potassium 4.1 BUN 7 creatinine 0.71.  12/27/2013.  WBC 1.0 hemoglobin 8.1 platelets 44,000.  Sodium 136 potassium 3.6 BUN 9 creatinine 0.72.  CK total was 39.  12/20/2013.  Sodium was 132 potassium 4.5 BUN 15 creatinine 0.97.  WBC at that point was 2.3 hemoglobin was 9.4 platelets of 53,000.  Assessment and plan.  #1-pancytopenia with significantly reduced white count-this was discussed with Dr. Dellia Nims via phone as well as with infectious disease physician Dr. Elesa Massed will be faxed to infectious disease for review they also apparently will move up his appointment for followup-clinically patient appears to be at baseline he is afebrile-  Will update labs--CBC-BMP-- on Monday, July 27-also will reduce potassium  To 20 meq a day-- since patient's diarrhea apparently has resolved-.  #2-diarrhea apparently this has resolved again will reduce potassium-C. difficile culture has come back negative--we'll discontinue Flagyl.--This was discussed with Dr. Dellia Nims via phone  #3-history of discitis osteomyelitis-he is on daptomycin as well as ceftazidine---he is receiving MS Contin 50 mg twice a day routine every 4 hours when necessary also is on a muscle relaxer every 6 hours as well as Neurontin 3 times a day.  This again is followed by infectious disease.  #4-history of SIADH-this appears to be stable most recent sodium actually shows improvement.  #5-history of HIV AIDS-again he is on the above-stated medications this is followed by infectious disease as well.  [id number] note greater than 40 minutes spent assessing patient-discussing his status with nursing staff-reviewing medical records-discussing his status with infectious disease physician via phone-and formulating a plan of care-of note greater than 50% of time spent coordinating plan of care

## 2014-01-01 ENCOUNTER — Ambulatory Visit (HOSPITAL_COMMUNITY): Payer: Medicaid Other | Attending: Internal Medicine

## 2014-01-01 ENCOUNTER — Emergency Department (HOSPITAL_COMMUNITY): Payer: Medicaid Other

## 2014-01-01 ENCOUNTER — Encounter (HOSPITAL_COMMUNITY): Payer: Self-pay | Admitting: Emergency Medicine

## 2014-01-01 ENCOUNTER — Other Ambulatory Visit: Payer: Self-pay | Admitting: Licensed Clinical Social Worker

## 2014-01-01 ENCOUNTER — Non-Acute Institutional Stay (SKILLED_NURSING_FACILITY): Payer: Medicaid Other | Admitting: Internal Medicine

## 2014-01-01 ENCOUNTER — Emergency Department (HOSPITAL_COMMUNITY)
Admission: EM | Admit: 2014-01-01 | Discharge: 2014-01-02 | Disposition: A | Discharge status: Skilled Nursing Facility | Payer: Medicaid Other | Source: Home / Self Care | Attending: Emergency Medicine | Admitting: Emergency Medicine

## 2014-01-01 DIAGNOSIS — R6889 Other general symptoms and signs: Secondary | ICD-10-CM | POA: Diagnosis present

## 2014-01-01 DIAGNOSIS — R630 Anorexia: Secondary | ICD-10-CM | POA: Diagnosis not present

## 2014-01-01 DIAGNOSIS — R112 Nausea with vomiting, unspecified: Secondary | ICD-10-CM | POA: Diagnosis not present

## 2014-01-01 DIAGNOSIS — Z21 Asymptomatic human immunodeficiency virus [HIV] infection status: Secondary | ICD-10-CM | POA: Diagnosis not present

## 2014-01-01 DIAGNOSIS — Z792 Long term (current) use of antibiotics: Secondary | ICD-10-CM | POA: Diagnosis not present

## 2014-01-01 DIAGNOSIS — D61818 Other pancytopenia: Secondary | ICD-10-CM | POA: Insufficient documentation

## 2014-01-01 DIAGNOSIS — B20 Human immunodeficiency virus [HIV] disease: Secondary | ICD-10-CM

## 2014-01-01 DIAGNOSIS — M869 Osteomyelitis, unspecified: Secondary | ICD-10-CM | POA: Insufficient documentation

## 2014-01-01 DIAGNOSIS — Z87898 Personal history of other specified conditions: Secondary | ICD-10-CM | POA: Insufficient documentation

## 2014-01-01 DIAGNOSIS — M79609 Pain in unspecified limb: Secondary | ICD-10-CM | POA: Insufficient documentation

## 2014-01-01 DIAGNOSIS — M4620 Osteomyelitis of vertebra, site unspecified: Secondary | ICD-10-CM

## 2014-01-01 DIAGNOSIS — D849 Immunodeficiency, unspecified: Secondary | ICD-10-CM | POA: Diagnosis not present

## 2014-01-01 DIAGNOSIS — F4310 Post-traumatic stress disorder, unspecified: Secondary | ICD-10-CM | POA: Diagnosis not present

## 2014-01-01 DIAGNOSIS — Z79899 Other long term (current) drug therapy: Secondary | ICD-10-CM | POA: Insufficient documentation

## 2014-01-01 DIAGNOSIS — R5381 Other malaise: Secondary | ICD-10-CM | POA: Diagnosis not present

## 2014-01-01 DIAGNOSIS — Z8719 Personal history of other diseases of the digestive system: Secondary | ICD-10-CM | POA: Insufficient documentation

## 2014-01-01 DIAGNOSIS — R197 Diarrhea, unspecified: Secondary | ICD-10-CM | POA: Diagnosis not present

## 2014-01-01 DIAGNOSIS — R5383 Other fatigue: Secondary | ICD-10-CM

## 2014-01-01 LAB — CBC WITH DIFFERENTIAL/PLATELET
Band Neutrophils: 0 % (ref 0–10)
Basophils Absolute: 0 10*3/uL (ref 0.0–0.1)
Basophils Relative: 0 % (ref 0–1)
Blasts: 0 %
Eosinophils Absolute: 0 10*3/uL (ref 0.0–0.7)
Eosinophils Relative: 2 % (ref 0–5)
HCT: 24 % — ABNORMAL LOW (ref 39.0–52.0)
Hemoglobin: 8.4 g/dL — ABNORMAL LOW (ref 13.0–17.0)
LYMPHS ABS: 0.4 10*3/uL — AB (ref 0.7–4.0)
LYMPHS PCT: 27 % (ref 12–46)
MCH: 37.8 pg — ABNORMAL HIGH (ref 26.0–34.0)
MCHC: 35 g/dL (ref 30.0–36.0)
MCV: 108.1 fL — ABNORMAL HIGH (ref 78.0–100.0)
Metamyelocytes Relative: 0 %
Monocytes Absolute: 0.2 10*3/uL (ref 0.1–1.0)
Monocytes Relative: 15 % — ABNORMAL HIGH (ref 3–12)
Myelocytes: 0 %
NEUTROS ABS: 1 10*3/uL — AB (ref 1.7–7.7)
NEUTROS PCT: 56 % (ref 43–77)
PROMYELOCYTES ABS: 0 %
Platelets: 57 10*3/uL — ABNORMAL LOW (ref 150–400)
RBC: 2.22 MIL/uL — ABNORMAL LOW (ref 4.22–5.81)
RDW: 15.9 % — ABNORMAL HIGH (ref 11.5–15.5)
WBC: 1.6 10*3/uL — ABNORMAL LOW (ref 4.0–10.5)
nRBC: 0 /100 WBC

## 2014-01-01 LAB — URINALYSIS, ROUTINE W REFLEX MICROSCOPIC
BILIRUBIN URINE: NEGATIVE
GLUCOSE, UA: NEGATIVE mg/dL
Hgb urine dipstick: NEGATIVE
Ketones, ur: NEGATIVE mg/dL
Leukocytes, UA: NEGATIVE
Nitrite: NEGATIVE
PH: 6.5 (ref 5.0–8.0)
Protein, ur: NEGATIVE mg/dL
Specific Gravity, Urine: 1.015 (ref 1.005–1.030)
Urobilinogen, UA: 0.2 mg/dL (ref 0.0–1.0)

## 2014-01-01 LAB — COMPREHENSIVE METABOLIC PANEL
ALK PHOS: 98 U/L (ref 39–117)
ALT: 13 U/L (ref 0–53)
AST: 30 U/L (ref 0–37)
Albumin: 2.1 g/dL — ABNORMAL LOW (ref 3.5–5.2)
Anion gap: 8 (ref 5–15)
BILIRUBIN TOTAL: 2.4 mg/dL — AB (ref 0.3–1.2)
BUN: 7 mg/dL (ref 6–23)
CO2: 20 meq/L (ref 19–32)
CREATININE: 0.62 mg/dL (ref 0.50–1.35)
Calcium: 8.2 mg/dL — ABNORMAL LOW (ref 8.4–10.5)
Chloride: 106 mEq/L (ref 96–112)
GFR calc Af Amer: >90 mL/min (ref >90)
Glucose, Bld: 83 mg/dL (ref 70–99)
POTASSIUM: 4.6 meq/L (ref 3.7–5.3)
Sodium: 134 mEq/L — ABNORMAL LOW (ref 137–147)
Total Protein: 6.7 g/dL (ref 6.0–8.3)

## 2014-01-01 LAB — I-STAT CG4 LACTIC ACID, ED: LACTIC ACID, VENOUS: 1.19 mmol/L (ref 0.5–2.2)

## 2014-01-01 LAB — POC OCCULT BLOOD, ED: Fecal Occult Bld: NEGATIVE

## 2014-01-01 MED ORDER — ATOVAQUONE 750 MG/5ML PO SUSP: 1500.0000 mg | Freq: Every day | ORAL | Status: DC

## 2014-01-01 MED ORDER — SODIUM CHLORIDE 0.9 % IV BOLUS (SEPSIS)
500.0000 mL | Freq: Once | INTRAVENOUS | Status: AC
  Administered 2014-01-01: 500 mL via INTRAVENOUS

## 2014-01-01 MED ORDER — HYDROMORPHONE HCL PF 1 MG/ML IJ SOLN
1.0000 mg | Freq: Once | INTRAMUSCULAR | Status: AC
  Administered 2014-01-01: 1 mg via INTRAVENOUS
  Filled 2014-01-01 (×2): qty 1

## 2014-01-01 MED ORDER — EMTRICITABINE-TENOFOVIR DF 200-300 MG PO TABS: 1.0000 | ORAL_TABLET | Freq: Every day | ORAL | Status: AC

## 2014-01-01 MED ORDER — DARUNAVIR ETHANOLATE 800 MG PO TABS: 800.0000 mg | ORAL_TABLET | Freq: Every day | ORAL | Status: AC

## 2014-01-01 MED ORDER — HYDROMORPHONE HCL PF 1 MG/ML IJ SOLN
1.0000 mg | Freq: Once | INTRAMUSCULAR | Status: AC
  Administered 2014-01-01: 1 mg via INTRAVENOUS
  Filled 2014-01-01: qty 1

## 2014-01-01 MED ORDER — RITONAVIR 100 MG PO CAPS: 100.0000 mg | ORAL_CAPSULE | Freq: Every day | ORAL | Status: AC

## 2014-01-01 NOTE — Progress Notes (Signed)
Spoke with pt re: returning to [facility].  Pt adamantly refusing to return to NH because of remarks Dr. Dellia Nims made to him today.  Per pt, Dr. Dellia Nims told him that he was dying, causing pt to become very distraught.  Pt refused CSW or Ombudsman involvement re: his concerns re: Dr. Dellia Nims, opting instead to call his personal attorney.  CSW also offered to contact [facility] SW re: assisting pt find another NH bed, but pt refused, stating he would rather be d/c'ed "out to the street" as opposed to return to [facility].  CSW did speak with NH who confirmed that pt could come back.  Pt to be moved to [facility] this pm and dayshift CSW will attempt to secure alternative placement for pt.

## 2014-01-01 NOTE — ED Notes (Signed)
PA at bedside.

## 2014-01-01 NOTE — Progress Notes (Addendum)
Patient ID: Samuel Schultz, male   DOB: Aug 23, 1975, 38 y.o.   MRN: [id number]               PROGRESS NOTE  DATE:  12/27/2013    FACILITY: [facility]    LEVEL OF CARE:   SNF   Acute Visit   HISTORY OF PRESENT ILLNESS:  This is an ill man who came to [place] after a stay at [facility] from 12/10/2013 through 12/20/2013.  He has diskitis in the lumbar area.  He has been receiving ceftazidine 2 g every 8 hours, as well as daptomycin.  On top of this, he has HIV, cirrhosis, and large cell lymphoma.  The large cell lymphoma, I do not know much about.  The patient states he is in remission.    During his stay in hospital, he had pancytopenia.  The exact mechanism of this is not totally clear (multiple possibilities).  However, on 12/19/2013, his white count was 2.1, hemoglobin 8.9, and platelet count 53.  After his arrival here on 12/20/2013, his white count was 2.3, hemoglobin 9.4, and platelet count 53.  Today, back at 1, 8.1, and 44.  He has 40% granulocytes.    On his arrival here, he had acute arthritis in the left first metatarsophalangeal joint as well as his left hand.  I gave him colchicine.  This seems to have helped, but obviously could be a culprit in his pancytopenia.    REVIEW OF SYSTEMS:   CHEST/RESPIRATORY:  No shortness of breath.  No cough.   CARDIAC:   No chest pain.   GI:  Tells me he has been having diarrhea since Sunday, as well as abdominal pain.  He states he had eight bowel movements yesterday.   I note a stool for C.diff was ordered yesterday, as well, but I do not think the patient was seen.   MUSCULOSKELETAL:  States the pain in his left hand and left first metatarsophalangeal is better.    PHYSICAL EXAMINATION:   GENERAL APPEARANCE:  The patient does not look well.  He has a low-grade fever at 99.3 this morning and 99 currently.  His blood pressure was 93/57 this morning.  However, currently it is 124/58, pulse ox at 90%, pulse 90, respirations 20.     CHEST/RESPIRATORY:  His air entry is clear bilaterally.   CARDIOVASCULAR:  CARDIAC:   Heart sounds are normal.  He does not appear to be that dehydrated.   GASTROINTESTINAL:  ABDOMEN:   Bowel sounds are reduced.  He is diffusely tender, but without guarding or rebound.  There are no obvious masses.   MUSCULOSKELETAL:   EXTREMITIES:  His acute arthritis is improved.  He is no longer nearly as tender in the left foot and left hand.    ASSESSMENT/PLAN:  Pancytopenia.  This was present previously, but seems to have worsened.  Among the more obvious causes, I am going to repeat this with a peripheral stick rather than the PICC line.  I have discontinued his colchicine.  This could be beta lactam affect.  I cannot exactly see this on the list of  side effects of daptomycin.  Whether this is related to his HIV or its medications, previous cirrhosis and lymphoma I am uncertain. I will repeat the CBC in 2 days time contact infectious disease for their advice if this is not improving.  Probable C.diff.  I am going to start him empirically on Flagyl while we await the toxin assay.  I  am going to start him on some IV fluids.    Left hand and wrist pain. The colchicine helped with the pain in his left foot but has not helped his left hand. Consider a course of steroids or NSAIDs if this continues to worsen  I will see how he responds the rest of the day, especially the repeat CBC and differential.

## 2014-01-01 NOTE — ED Notes (Signed)
PA Lisa at the bedside.  

## 2014-01-01 NOTE — ED Notes (Signed)
Per EMS- pt is from the [facility]. Pt was recently in the hospital for 3 weeks and went to nursing center after that. Pts WBC has been monitored due to lymphoma. WBC has decreased 1.4-0.9. Pt is also being treated for an infection in his spine and has pain due to that.

## 2014-01-01 NOTE — ED Notes (Signed)
CSW has called the [facility] and left message for Charge RN re: pt.  Await return call.

## 2014-01-01 NOTE — ED Provider Notes (Signed)
CSN: [id number]     Arrival date & time 01/01/14  1442 History   First MD Initiated Contact with Patient 01/01/14 1503     Chief Complaint  Patient presents with  . Abnormal Lab     (Consider location/radiation/quality/duration/timing/severity/associated sxs/prior Treatment) The history is provided by the patient and medical records.   This is a 39 y.o. M with extensive PMH including HIV, cirrhosis, lymphoma, PTSD, osteomyelitis of spine, presenting to the ED for abnormal lab values.  Pt is currently residing at a nursing center upon discharge of a three-week hospital stay. Patient is currently followed by infectious disease, Dr. Tommy Medal, who is closely monitoring his pancytopenia. Lab results from a few days ago revealing a white blood cell count of 0.9, down from 1.4. Patient has PICC line in place and is currently being treated with daptomycin and ceftazidine for his L3-L4 discitis/osteomyelitis.  States he still has significant pain in his low back, but denies numbness or paresthesias of lower extremities. No loss of bowel or bladder control.  States he currently feels extremely weak, it took 2 nurses to help him get out of bed this morning. He continues having intermittent diarrhea which has been an ongoing issue for several months. He does note that some of his stools have appeared black and tarry in color, but denies any bright red blood.  Also notes some nausea and non-bloody non-bilious emesis recently and poor PO intake.  Denies known fever but reports frequent sweats throughout the night.  Dry cough present with intermittent SOB, mostly with exertion.  Denies chest pain, abdominal pain.  Past Medical History  Diagnosis Date  . Cancer   . Lymphoma   . HIV disease   . Cirrhosis   . PTSD (post-traumatic stress disorder)   . Depressive disorder    Past Surgical History  Procedure Laterality Date  . Cholecystectomy    . Appendectomy    . Tee without cardioversion N/A 11/13/2013   Procedure: TRANSESOPHAGEAL ECHOCARDIOGRAM (TEE);  Surgeon: Dorothy Spark, MD;  [facility];  Service: Cardiovascular;  Laterality: N/A;   Family History  Problem Relation Age of Onset  . Cancer Maternal Grandmother     breast ca  . Cancer Maternal Grandfather     colon ca   History  Substance Use Topics  . Smoking status: Never Smoker   . Smokeless tobacco: Never Used  . Alcohol Use: No    Review of Systems  Neurological: Positive for weakness.  All other systems reviewed and are negative.     Allergies  Codeine and Oxycodone  Home Medications   Prior to Admission medications   Medication Sig Start Date End Date Taking? Authorizing Provider  atovaquone (MEPRON) 750 MG/5ML suspension Take 10 mLs (1,500 mg total) by mouth daily. 01/01/14   Truman Hayward, MD  azithromycin [facility]) 600 MG tablet Take 2 tablets (1,200 mg total) by mouth every Friday. 12/18/13   Geradine Girt, DO  cefTAZidime 2 g in dextrose 5 % 50 mL Inject 2 g into the vein every 8 (eight) hours. 12/18/13   Geradine Girt, DO  colchicine 0.6 MG tablet Take 1 tablet (0.6 mg total) by mouth daily. 12/18/13   Geradine Girt, DO  cyclobenzaprine (FLEXERIL) 5 MG tablet Take 1 tablet (5 mg total) by mouth at bedtime. 12/18/13   Geradine Girt, DO  DAPTOmycin 700 mg in sodium chloride 0.9 % 100 mL Inject 700 mg into the vein daily. 12/18/13  Geradine Girt, DO  Darunavir Ethanolate (PREZISTA) 800 MG tablet Take 1 tablet (800 mg total) by mouth daily. 01/01/14   Truman Hayward, MD  emtricitabine-tenofovir (TRUVADA) 200-300 MG per tablet Take 1 tablet by mouth at bedtime. 01/01/14   Truman Hayward, MD  feeding supplement, RESOURCE BREEZE, (RESOURCE [place]) LIQD Take 1 Container by mouth 2 (two) times daily after a meal. 12/18/13   Geradine Girt, DO  furosemide (LASIX) 40 MG tablet Take 1 tablet (40 mg total) by mouth 2 (two) times daily. 12/18/13   Geradine Girt, DO  gabapentin (NEURONTIN) 300  MG capsule Take 300 mg by mouth 3 (three) times daily.    Historical Provider, MD  lactose free nutrition (BOOST PLUS) LIQD Take 237 mLs by mouth 2 (two) times daily. 12/18/13   Geradine Girt, DO  methocarbamol (ROBAXIN) 500 MG tablet Take 1 tablet (500 mg total) by mouth every 6 (six) hours as needed for muscle spasms. 12/19/13   Geradine Girt, DO  morphine (MS CONTIN) 15 MG 12 hr tablet Take one tablet by mouth twice daily for pain. Do not crush 12/19/13   Estill Dooms, MD  morphine (MSIR) 15 MG tablet Take one tablet by mouth every 4 hours as needed for pain 12/21/13   Pricilla Larsson, NP  pantoprazole (PROTONIX) 40 MG tablet Take 40 mg by mouth 2 (two) times daily.    Historical Provider, MD  phytonadione (VITAMIN K) 5 MG tablet Take 10 mg by mouth daily.    Historical Provider, MD  ritonavir (NORVIR) 100 MG capsule Take 1 capsule (100 mg total) by mouth daily with breakfast. With Prezista 01/01/14   Truman Hayward, MD  sucralfate (CARAFATE) 1 G tablet Take 1 g by mouth 3 (three) times daily with meals.     Historical Provider, MD   BP 110/63  Pulse 91  Temp(Src) 99 F (37.2 C) (Oral)  Resp 18  SpO2 98%  Physical Exam  Nursing note and vitals reviewed. Constitutional: He is oriented to person, place, and time. He appears well-developed and well-nourished.  Chronically ill appearing, pale  HENT:  Head: Normocephalic and atraumatic.  Right Ear: Tympanic membrane and ear canal normal.  Left Ear: Tympanic membrane and ear canal normal.  Nose: Nose normal.  Mouth/Throat: Uvula is midline, oropharynx is clear and moist and mucous membranes are normal. No oropharyngeal exudate, posterior oropharyngeal edema, posterior oropharyngeal erythema or tonsillar abscesses.  Eyes: Conjunctivae and EOM are normal. Pupils are equal, round, and reactive to light.  Neck: Normal range of motion and full passive range of motion without pain. Neck supple. No rigidity.  Cardiovascular: Normal rate,  regular rhythm and normal heart sounds.   Pulmonary/Chest: Effort normal and breath sounds normal. No respiratory distress. He has no wheezes. He has no rhonchi.  Abdominal: Soft. Bowel sounds are normal. There is no tenderness. There is no guarding.  Musculoskeletal: Normal range of motion. He exhibits no edema.  PICC line RUE; clean insertion site without erythema, induration, cellulitis, drainage, or other signs of infection  Neurological: He is alert and oriented to person, place, and time.  Skin: Skin is warm and dry.  Psychiatric: He has a normal mood and affect.    ED Course  Procedures (including critical care time) Labs Review Labs Reviewed  CBC WITH DIFFERENTIAL - Abnormal; Notable for the following:    WBC 1.6 (*)    RBC 2.22 (*)    Hemoglobin  8.4 (*)    HCT 24.0 (*)    MCV 108.1 (*)    MCH 37.8 (*)    RDW 15.9 (*)    Platelets 57 (*)    Monocytes Relative 15 (*)    Neutro Abs 1.0 (*)    Lymphs Abs 0.4 (*)    All other components within normal limits  COMPREHENSIVE METABOLIC PANEL - Abnormal; Notable for the following:    Sodium 134 (*)    Calcium 8.2 (*)    Albumin 2.1 (*)    Total Bilirubin 2.4 (*)    All other components within normal limits  CULTURE, BLOOD (ROUTINE X 2)  CULTURE, BLOOD (ROUTINE X 2)  URINE CULTURE  URINALYSIS, ROUTINE W REFLEX MICROSCOPIC  I-STAT CG4 LACTIC ACID, ED  POC OCCULT BLOOD, ED    Imaging Review Dg Chest Port 1 View  01/01/2014   CLINICAL DATA:  Shortness of breath  EXAM: PORTABLE CHEST - 1 VIEW  COMPARISON:  Portable chest x-ray of December 16, 2013  FINDINGS: The lungs are borderline hypoinflated but clear. Suspected right basilar atelectasis has cleared. The cardiac silhouette is mildly enlarged. The pulmonary vascularity is prominent centrally. There is no pleural effusion. The PICC line tip lies in the midportion of the SVC. The bony thorax is unremarkable.  IMPRESSION: There is bilateral pulmonary hypo inflation. There is no  evidence of CHF nor of pneumonia.   Electronically Signed   By: David  Martinique   On: 01/01/2014 16:18   Dg Hand Complete Left  01/01/2014   CLINICAL DATA:  Increasing left hand pain  EXAM: LEFT HAND - COMPLETE 3+ VIEW  COMPARISON:  None.  FINDINGS: There is no evidence of fracture or dislocation. There is no evidence of arthropathy or other focal bone abnormality. Soft tissues are unremarkable.  IMPRESSION: Negative.   Electronically Signed   By: Skipper Cliche M.D.   On: 01/01/2014 11:37     EKG Interpretation   Date/Time:  Monday January 01 2014 14:54:02 EDT Ventricular Rate:  95 PR Interval:  169 QRS Duration: 91 QT Interval:  379 QTC Calculation: 476 R Axis:   77 Text Interpretation:  Sinus rhythm Borderline prolonged QT interval  Compared to previous tracing T wave inversion now evident in V1 V2  Confirmed by [facility]  MD, Jenny Reichmann ([id number]) on 01/01/2014 3:05:47 PM      MDM   Final diagnoses:  Pancytopenia  Osteomyelitis of spine  HIV (human immunodeficiency virus infection)   38 y.o. immunocompromised male presenting to the ED for abnormal labs. Has been followed by infectious disease pancytopenia. Currently being treated for osteomyelitis of lumbar spine with daptomycin and ceftazidime.  On arrival, he is chronically ill appearing but non-toxic and is afebrile.  His biggest complaint is back pain and generalized weakness.  Will obtain labs, u/a, lactic acid, blood and urine cultures, CXR.  Dilaudid given for pain.  EKG sinus bradycardia without ischemic changes.  Labs again revealing pancytopenia, largely unchanged from results prior to discharge one week ago. Guaiac is negative and Hgb is stable.  CXR clear.  Case was discussed with on-call infectious disease, Dr. Megan Salon, who feels that given this is an ongoing issue and no drastic change from prior results feel he can be safely discharged home with close FU with Dr. Tommy Medal this week.  Urine and blood cultures are pending.  This was  discussed with patient but upon telling him that he is cleared to return back to [place] center, he  refused.  States if we discharge him back to that facility he will just end up in the street because he will not stay there and be under the care of Dr. Dellia Nims after telling him that he was going to die today. Pt is visibly upset and emotional regarding the events of today.  Given his fragile medical and emotional state, he needs treatment and do not feel safe discharging him back to his facility at this time while he is threatening to leave.  Have spoken with case management who will work on alternative placement.  Will place in Pod C overnight tonight.  Have ordered his regular home medications including abx for morning.  I have messaged Dr. Megan Salon regarding this change in plan of care.  Care signed out to Dr. Reather Converse at shift change.  Case discussed with attending physician, Dr. Vanita Panda, who agrees with assessment and plan of care.  Larene Pickett, PA-C 01/02/14 0104

## 2014-01-01 NOTE — Progress Notes (Signed)
ED CM spoke with patient at bedside, Patient presented to [facility] ED for abnormal labs. Patient has hx of HIV, cirrhosis, lymphoma, PTSD, osteomyelitis of spine  Pt is currently residing at the [facility] in [place]  Since  discharge of a three-week hospital stay. Patient is currently followed by infectious disease, Dr. Tommy Medal, who is closely monitoring his pancytopenia. Lab results from a few days ago revealing a white blood cell count of 0.9 Patient is receiving daptomycin IV for osteomyelitis. Patient reports he was told by PCP at the facility today that he need to get his affairs in order",  Patient is very tearful and upset and threatening not to return to facility. ED CM explained that the general  process for inter facility  take place at the facility and is done through the SW at the facility. Will consult with ED  CSW  Regarding patient.

## 2014-01-01 NOTE — ED Notes (Signed)
Walked in and patient had removed BP cuff and all the leads for the cardiac monitor. Patient was trying to pull out his own PICC Line. Patient encouraged not to take out PICC Line and that this nurse did not have the expertise to remove it. Made aware I would speak with the MD about his concerns. Patient verbalized understanding and left in the room reading papers.

## 2014-01-02 ENCOUNTER — Inpatient Hospital Stay
Admission: RE | Admit: 2014-01-02 | Discharge: 2014-01-04 | Disposition: A | Discharge status: Other Acute Inpatient Hospital | Payer: Medicaid Other | Source: Ambulatory Visit | Attending: Internal Medicine | Admitting: Internal Medicine

## 2014-01-02 LAB — URINE CULTURE
CULTURE: NO GROWTH
Colony Count: NO GROWTH

## 2014-01-02 MED ORDER — AMOXICILLIN 500 MG PO CAPS: 500.0000 mg | ORAL_CAPSULE | Freq: Three times a day (TID) | ORAL | Status: DC

## 2014-01-02 MED ORDER — MORPHINE SULFATE 15 MG PO TABS
15.0000 mg | ORAL_TABLET | ORAL | Status: DC | PRN
  Administered 2014-01-02 (×2): 15 mg via ORAL
  Filled 2014-01-02 (×2): qty 1

## 2014-01-02 MED ORDER — CLONAZEPAM 0.5 MG PO TABS
0.5000 mg | ORAL_TABLET | Freq: Two times a day (BID) | ORAL | Status: DC | PRN
  Administered 2014-01-02: 0.5 mg via ORAL
  Filled 2014-01-02: qty 1

## 2014-01-02 MED ORDER — BUPROPION HCL ER (XL) 150 MG PO TB24
150.0000 mg | ORAL_TABLET | Freq: Every day | ORAL | Status: DC
  Filled 2014-01-02: qty 1

## 2014-01-02 MED ORDER — DARUNAVIR ETHANOLATE 800 MG PO TABS
800.0000 mg | ORAL_TABLET | Freq: Every day | ORAL | Status: DC
  Filled 2014-01-02 (×2): qty 1

## 2014-01-02 MED ORDER — SODIUM CHLORIDE 0.9 % IJ SOLN
10.0000 mL | INTRAMUSCULAR | Status: DC | PRN
  Administered 2014-01-02: 10 mL

## 2014-01-02 MED ORDER — PROMETHAZINE HCL 12.5 MG PO TABS
12.5000 mg | ORAL_TABLET | Freq: Four times a day (QID) | ORAL | Status: DC | PRN
  Filled 2014-01-02: qty 1

## 2014-01-02 MED ORDER — POTASSIUM CHLORIDE CRYS ER 20 MEQ PO TBCR
20.0000 meq | EXTENDED_RELEASE_TABLET | Freq: Every day | ORAL | Status: DC
  Filled 2014-01-02: qty 1

## 2014-01-02 MED ORDER — PANTOPRAZOLE SODIUM 40 MG PO TBEC
40.0000 mg | DELAYED_RELEASE_TABLET | Freq: Every day | ORAL | Status: DC
Start: 2014-01-02 — End: 2014-01-02
  Filled 2014-01-02: qty 1

## 2014-01-02 MED ORDER — ATOVAQUONE 750 MG/5ML PO SUSP
1500.0000 mg | Freq: Every day | ORAL | Status: DC
  Filled 2014-01-02: qty 10

## 2014-01-02 MED ORDER — RITONAVIR 100 MG PO CAPS
100.0000 mg | ORAL_CAPSULE | Freq: Every day | ORAL | Status: DC
  Filled 2014-01-02 (×2): qty 1

## 2014-01-02 MED ORDER — MORPHINE SULFATE ER 15 MG PO TBCR
15.0000 mg | EXTENDED_RELEASE_TABLET | Freq: Two times a day (BID) | ORAL | Status: DC
  Administered 2014-01-02 (×2): 15 mg via ORAL
  Filled 2014-01-02 (×2): qty 1

## 2014-01-02 MED ORDER — SODIUM CHLORIDE 0.9 % IV SOLN
700.0000 mg | INTRAVENOUS | Status: DC
  Filled 2014-01-02: qty 14

## 2014-01-02 MED ORDER — BUPROPION HCL 75 MG PO TABS: 75.0000 mg | ORAL_TABLET | Freq: Every day | ORAL | Status: DC

## 2014-01-02 MED ORDER — SUCRALFATE 1 G PO TABS: 1.0000 g | ORAL_TABLET | Freq: Three times a day (TID) | ORAL | Status: DC

## 2014-01-02 MED ORDER — EMTRICITABINE-TENOFOVIR DF 200-300 MG PO TABS
1.0000 | ORAL_TABLET | Freq: Every day | ORAL | Status: DC
  Filled 2014-01-02 (×2): qty 1

## 2014-01-02 MED ORDER — SODIUM CHLORIDE 0.9 % IJ SOLN: 10.0000 mL | Freq: Two times a day (BID) | INTRAMUSCULAR | Status: DC

## 2014-01-02 MED ORDER — GABAPENTIN 300 MG PO CAPS
300.0000 mg | ORAL_CAPSULE | Freq: Three times a day (TID) | ORAL | Status: DC
  Filled 2014-01-02: qty 1

## 2014-01-02 MED ORDER — CIPROFLOXACIN HCL 500 MG PO TABS: 500.0000 mg | ORAL_TABLET | Freq: Two times a day (BID) | ORAL | Status: DC

## 2014-01-02 MED ORDER — AZITHROMYCIN 600 MG PO TABS: 1200.0000 mg | ORAL_TABLET | ORAL | Status: DC

## 2014-01-02 MED ORDER — DEXTROSE 5 % IV SOLN
2.0000 g | Freq: Three times a day (TID) | INTRAVENOUS | Status: DC
  Administered 2014-01-02: 2 g via INTRAVENOUS
  Filled 2014-01-02 (×4): qty 2

## 2014-01-02 MED ORDER — PHYTONADIONE 5 MG PO TABS
2.5000 mg | ORAL_TABLET | Freq: Every day | ORAL | Status: DC
  Filled 2014-01-02: qty 1

## 2014-01-02 MED ORDER — CYCLOBENZAPRINE HCL 10 MG PO TABS
5.0000 mg | ORAL_TABLET | Freq: Every day | ORAL | Status: DC
  Administered 2014-01-02: 5 mg via ORAL
  Filled 2014-01-02: qty 1

## 2014-01-02 MED ORDER — METHOCARBAMOL 500 MG PO TABS
500.0000 mg | ORAL_TABLET | Freq: Four times a day (QID) | ORAL | Status: DC | PRN
  Administered 2014-01-02 (×2): 500 mg via ORAL
  Filled 2014-01-02 (×2): qty 1

## 2014-01-02 NOTE — ED Provider Notes (Signed)
Care assumed from overnight team.  Pt is a 38 yo male with hx of HIV, neutropenia, and Osteomyelitis, the latter of which is being treated with IV dapotomycin at [facility].  He was transferred here due to neutropenia.  After evaluation last night, it was felt that he was at his baseline and did not require further inpatient or emergency care.  He was cleared to be transferred back to [facility].  However, he refused to be transferred back there. So, he was kept in the ED overnight for Social Work evaluation for possible alternative placement.   I have help multiple conversations with the patient throughout the day today.  He is very difficult to converse with as he is clearly very angry with our Emergency Department and the [facility].  However, he explained that his reasoning for refusing to return to [facility] is because he was told by his physician that he should get his affairs in order because he may die.    Throughout his ED stay, Doris, with Social Work, has been working to try and find him placement at an alternative facility.  As of now, 4:21PM, he does not have any acceptances other than to [facility].    Although he was not receptive to the treatment options that I recommended, he clearly had capacity to make his own medical decisions.  He stated that he would rather be discharged from the ED than go back to [facility].  He understood that we would have to remove his PICC line if her were to leave AMA.  He understood that he has multiple medical problems that would likely lead to significant morbidity and likely morbidity if he were to leave AMA.  I discussed an alternative treatment plan with Dr. Megan Salon (with ID) and communicated this to the patient.  This includes PO Ciprofloxacin and PO Amoxicillin.  He understands that this treatment is not as good as IV antibiotics for his condition.  He is homeless, and therefore, home health is not an option for his treatment.    At this point, he  is making his final decision about whether he will be transferred back to [facility] or whether he would leave the ED against our medical advice.  Dr. Tawnya Crook will follow up on this final decision and I will prepare his prescriptions and discharge paperwork in case he decides ultimately to leave.    4:39 PM All attempts at placement at other facilities have been unsuccessful.  Pt ultimately decided to return to the [facility] to continue his current treatment regimen.  Will dc from ED and arrange transfer back to his facility.    Clinical Impression: 1. Pancytopenia   2. Osteomyelitis of spine   3. HIV (human immunodeficiency virus infection)       Houston Siren III, MD 01/02/14 [phone]

## 2014-01-02 NOTE — Progress Notes (Signed)
CSW consult to pt regarding possible placement. Pt from [facility] and is refusing to return. Pt is requesting placement at other facilities.EDP, Dr. Doy Mince and CSW met with pt to discuss disposition. Pt was antagonistic when information was being shared about disposition. CSW contacted several facilities on pt's behalf. CSW awaiting answer from [facility]. CSW has placed several calls to this facility and the Director of Nursing has to give the final answer of acceptance. This information was shared with pt. EDP shared with pt that there is no medical reason for pt to be in the ED and that a discharge plan will be put into place. Pt was informed that his bed is being held at [facility] and he can return. Pt was informed that there is a possibility of another facility, [facility] will accept and CSW will call before 5:00 pm. Pt was informed that he could be discharged AMA. Pt reported that he will wait until 5:00 pm to receive answer from [facility]. CSW and EDP agreed. Pt awaiting disposition.  Juliann Mule [phone]

## 2014-01-02 NOTE — ED Notes (Signed)
Pt out in hall being loud stating he is not going back to room and he will be contacting lawyer.  Security called for assistance.

## 2014-01-02 NOTE — ED Notes (Signed)
Asked pt if he would like his muscle relaxer that is a PRN medication and he stated that he would take that medication. He also stated that he had not had anything to eat and was worried about his medication making him sick on his stomach. Offered to order a tray and pt refused to have tray ordered but did take beef

## 2014-01-02 NOTE — ED Notes (Signed)
PTAR leaving ED.

## 2014-01-02 NOTE — ED Notes (Signed)
CSW arranged transportation for pt back to [facility] via [place].

## 2014-01-02 NOTE — ED Notes (Addendum)
Pt c/o back pain. When asked if he would like his MS CONTIN pt refused and stated that it would not touch his pain and that he did not want it.

## 2014-01-02 NOTE — ED Notes (Signed)
Pt refusing to sign discharge.  Pt states "I am being forced to go back to [facility] against my will.  I have recorded phone conversations with MD twice stating I will be put in [place] unless I go back".  Social worker advised.  No new orders.

## 2014-01-02 NOTE — ED Notes (Signed)
Pt demanding Dr. Omer Jack name be put on discharge papers since he was discharging MD.  Discharge papers reprinted and given to pt.

## 2014-01-02 NOTE — Progress Notes (Signed)
CSW placed call to [facility] who reported that they will not accept pt due to his need for isolation. EDP and evening CSW contacted. Pt awaiting disposition.   [address] [place]

## 2014-01-02 NOTE — ED Notes (Signed)
PTAR in ED to take pt back to [facility].

## 2014-01-02 NOTE — ED Notes (Signed)
Spoke with Dr Doy Mince Re: complaints pt reported to this nurse. Attempted calming techniques with pt, offered hospital bed as opposed to ED stretcher. Pt refused, reports, "It's beyond that." "Take this PICC out and let me leave or I will pull it out." Explained to pt the Dangers of Pulling out his own PICC line. Pt reports, "If they don't care why should I." Pt also requests that his room door be left opened. Explained to pt that he is neutropenic which means his white blood cells are low and that we keep the door closed for his protection. Pt continues to refuse to have door closed.

## 2014-01-02 NOTE — ED Notes (Signed)
Pt refuses discharge vital signs. 

## 2014-01-02 NOTE — ED Notes (Signed)
Called report to [facility], spoke with [facility].  Pt returning to room 119.

## 2014-01-02 NOTE — ED Notes (Signed)
Pt is now saying that he will take his MS Contin because PTAR is at bedside with him as a witness.

## 2014-01-02 NOTE — ED Notes (Signed)
Care transferred, report received Dorann Ou, RN.

## 2014-01-03 NOTE — ED Provider Notes (Signed)
  This was a shared visit with a mid-level provided (NP or PA).  Throughout the patient's course I was available for consultation/collaboration.  I saw the ECG (if appropriate), relevant labs and studies - I agree with the interpretation.  On my exam the patient was in no distress. However, given his multiple medical problems, and concern for possible infection with possible neutropenia, patient brought evaluation, and throughout his evaluation I discussed his case with the physician assistant. Patient's labs were mildly improved from recent studies, but he remains relatively neutropenic.  After discussion with his former nursing home, it was clear that the patient would not return to that facility.  Patient did not need criteria for inpatient admission.  He worked with social work to find a new options for the patient.  This occurred after discussion with the patient's infectious disease team, who concurred with the patient's absence for indication of full admission.  On signout, the patient was awaiting additional social work, case Airline pilot.     Carmin Muskrat, MD 01/03/14 [phone]

## 2014-01-04 ENCOUNTER — Emergency Department (HOSPITAL_COMMUNITY)
Admission: EM | Admit: 2014-01-04 | Discharge: 2014-01-04 | Discharge status: Against Medical Advice | Payer: Medicaid Other | Attending: Emergency Medicine | Admitting: Emergency Medicine

## 2014-01-04 ENCOUNTER — Encounter (HOSPITAL_COMMUNITY): Payer: Self-pay | Admitting: Emergency Medicine

## 2014-01-04 DIAGNOSIS — M79609 Pain in unspecified limb: Secondary | ICD-10-CM | POA: Insufficient documentation

## 2014-01-04 HISTORY — DX: Anemia, unspecified: D64.9

## 2014-01-04 NOTE — ED Notes (Addendum)
Rt leg pain for 1 week, seen here today Pt is from [place].  Came via w/c .  Pt has a PICC rt arm , getting IV antibiotics

## 2014-01-04 NOTE — Progress Notes (Signed)
Patient ID: Samuel Schultz, male   DOB: 03-18-1976, 38 y.o.   MRN: [id number]               PROGRESS NOTE  DATE:  01/01/2014         FACILITY: [facility]    LEVEL OF CARE:   SNF   Acute Visit   CHIEF COMPLAINT:  Progressive pancytopenia.    HISTORY OF PRESENT ILLNESS:  [name] is a very complicated man who came here predominantly for treatment and completion of antibiotics for an extensive L3-L4 diskitis.   He has been on [place] and daptomycin.    He has underlying HIV, on medications as directed by Infectious Disease.    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    The patient has had progressive pancytopenia dating back over the last several days.  I had initially put him on colchicine for severe left hand and foot pain, thinking that this was probably gout.  Colchicine can certainly do this.  This was discontinued on 12/27/2013.  Nevertheless, his counts have continued to fall.  He remains on daptomycin and Fortaz.  Today, his counts were a white count of 0.9 with granulocytes too few to count.  The pathology smear is pending.  His hemoglobin is down to 7.5, platelet count 42,000.     PHYSICAL EXAMINATION:   GENERAL APPEARANCE:  The patient looks scared and chronically ill, but not acutely ill.   GASTROINTESTINAL:  LIVER/SPLEEN/KIDNEYS:  No liver, no spleen are palpable.   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   LEFT LOWER EXTREMITY:  His foot on the left metatarsophalangeals is actually quite a bit better.    ASSESSMENT/PLAN:  Severe neutropenia.  Hopefully, this is drug-induced.  I have not much experience with daptomycin.  However, I have seen beta lactams do this.  I am not familiar enough with the  side effects of his HIV regimen to really comment on this.  He has underlying large cell lymphoma which could be  playing a role here, as well.  I would wonder, if this does not respond to discontinuation of one or more of his medications, whether he is going to need a bone marrow aspirate.    Towards this end, I do not really see the point in continuing his stay in the nursing home.  I will refer him to [facility] for admission and work-up of this problem.  I have discussed this with the patient.

## 2014-01-04 NOTE — ED Notes (Signed)
Pt states it is taking too long for the doctor to see him, so he is leaving.  This nurse asked the patient if he has pain meds ordered at [facility], and he states "Yes" but they are not working and the doctor won't order me anything else.  Informed patient that he is welcome to stay to be seen and that he should be seen fairly soon.  Pt refused, and asked for assistance into his wheelchair.  I told patient that I would call [place] to have them come through the tunnel to get him, but he said "Oh, that will take forever" and patient wheeled himself out of the e.d. To the waiting area.

## 2014-01-05 ENCOUNTER — Inpatient Hospital Stay
Admission: RE | Admit: 2014-01-05 | Discharge: 2014-01-26 | Disposition: A | Discharge status: Other Acute Inpatient Hospital | Payer: Medicaid Other | Source: Ambulatory Visit | Attending: Internal Medicine | Admitting: Internal Medicine

## 2014-01-05 ENCOUNTER — Non-Acute Institutional Stay (SKILLED_NURSING_FACILITY): Payer: Medicaid Other | Admitting: Internal Medicine

## 2014-01-05 ENCOUNTER — Emergency Department (HOSPITAL_COMMUNITY)
Admission: EM | Admit: 2014-01-05 | Discharge: 2014-01-05 | Disposition: A | Discharge status: Home / Self Care | Payer: Medicaid Other | Attending: Emergency Medicine | Admitting: Emergency Medicine

## 2014-01-05 ENCOUNTER — Emergency Department (HOSPITAL_COMMUNITY): Payer: Medicaid Other

## 2014-01-05 ENCOUNTER — Encounter (HOSPITAL_COMMUNITY): Payer: Self-pay | Admitting: Emergency Medicine

## 2014-01-05 ENCOUNTER — Encounter: Payer: Self-pay | Admitting: Internal Medicine

## 2014-01-05 DIAGNOSIS — Z79899 Other long term (current) drug therapy: Secondary | ICD-10-CM | POA: Insufficient documentation

## 2014-01-05 DIAGNOSIS — F329 Major depressive disorder, single episode, unspecified: Secondary | ICD-10-CM | POA: Diagnosis not present

## 2014-01-05 DIAGNOSIS — Z21 Asymptomatic human immunodeficiency virus [HIV] infection status: Secondary | ICD-10-CM | POA: Diagnosis not present

## 2014-01-05 DIAGNOSIS — Z859 Personal history of malignant neoplasm, unspecified: Secondary | ICD-10-CM | POA: Diagnosis not present

## 2014-01-05 DIAGNOSIS — Z87898 Personal history of other specified conditions: Secondary | ICD-10-CM | POA: Insufficient documentation

## 2014-01-05 DIAGNOSIS — R509 Fever, unspecified: Secondary | ICD-10-CM | POA: Insufficient documentation

## 2014-01-05 DIAGNOSIS — Z8719 Personal history of other diseases of the digestive system: Secondary | ICD-10-CM | POA: Diagnosis not present

## 2014-01-05 DIAGNOSIS — F3289 Other specified depressive episodes: Secondary | ICD-10-CM | POA: Insufficient documentation

## 2014-01-05 DIAGNOSIS — M869 Osteomyelitis, unspecified: Secondary | ICD-10-CM | POA: Insufficient documentation

## 2014-01-05 DIAGNOSIS — Z862 Personal history of diseases of the blood and blood-forming organs and certain disorders involving the immune mechanism: Secondary | ICD-10-CM | POA: Diagnosis not present

## 2014-01-05 LAB — URINALYSIS, ROUTINE W REFLEX MICROSCOPIC
GLUCOSE, UA: NEGATIVE mg/dL
HGB URINE DIPSTICK: NEGATIVE
KETONES UR: NEGATIVE mg/dL
Leukocytes, UA: NEGATIVE
Nitrite: NEGATIVE
Protein, ur: NEGATIVE mg/dL
Specific Gravity, Urine: 1.015 (ref 1.005–1.030)
Urobilinogen, UA: 0.2 mg/dL (ref 0.0–1.0)
pH: 6.5 (ref 5.0–8.0)

## 2014-01-05 LAB — CBC WITH DIFFERENTIAL/PLATELET
BASOS PCT: 1 % (ref 0–1)
Basophils Absolute: 0 10*3/uL (ref 0.0–0.1)
EOS ABS: 0 10*3/uL (ref 0.0–0.7)
EOS PCT: 3 % (ref 0–5)
HCT: 22.9 % — ABNORMAL LOW (ref 39.0–52.0)
HEMOGLOBIN: 8.2 g/dL — AB (ref 13.0–17.0)
LYMPHS ABS: 0.3 10*3/uL — AB (ref 0.7–4.0)
Lymphocytes Relative: 24 % (ref 12–46)
MCH: 37.6 pg — AB (ref 26.0–34.0)
MCHC: 35.8 g/dL (ref 30.0–36.0)
MCV: 105 fL — ABNORMAL HIGH (ref 78.0–100.0)
MONO ABS: 0.2 10*3/uL (ref 0.1–1.0)
MONOS PCT: 17 % — AB (ref 3–12)
Neutro Abs: 0.7 10*3/uL — ABNORMAL LOW (ref 1.7–7.7)
Neutrophils Relative %: 56 % (ref 43–77)
Platelets: 58 10*3/uL — ABNORMAL LOW (ref 150–400)
RBC: 2.18 MIL/uL — ABNORMAL LOW (ref 4.22–5.81)
RDW: 15 % (ref 11.5–15.5)
WBC: 1.3 10*3/uL — CL (ref 4.0–10.5)

## 2014-01-05 LAB — FUNGUS CULTURE W SMEAR: FUNGAL SMEAR: NONE SEEN

## 2014-01-05 LAB — BASIC METABOLIC PANEL
Anion gap: 8 (ref 5–15)
BUN: 6 mg/dL (ref 6–23)
CO2: 22 mEq/L (ref 19–32)
CREATININE: 0.53 mg/dL (ref 0.50–1.35)
Calcium: 8.2 mg/dL — ABNORMAL LOW (ref 8.4–10.5)
Chloride: 105 mEq/L (ref 96–112)
GLUCOSE: 116 mg/dL — AB (ref 70–99)
Potassium: 4.3 mEq/L (ref 3.7–5.3)
Sodium: 135 mEq/L — ABNORMAL LOW (ref 137–147)

## 2014-01-05 NOTE — ED Notes (Signed)
CRITICAL VALUE ALERT  Critical value received: wbc 1.3  Date of notification:  01/05/14  Time of notification:  2011  Critical value read back:yes  Nurse who received alert:  Bobbye Morton  MD notified (1st page):    Time of first page  MD notified (2nd page):  Time of second page:  Responding MD:  zammit  Time MD responded:  2011

## 2014-01-05 NOTE — ED Notes (Signed)
Fever for  6-7 days.  Is at [facility],  Has PICC line and is currently receiving IV antibiodics for stomach infection.  Pt also c/o right upper leg pain.  Vomited times 1

## 2014-01-05 NOTE — ED Provider Notes (Signed)
CSN: [id number]     Arrival date & time 01/05/14  1810 History   First MD Initiated Contact with Patient 01/05/14 1813     Chief Complaint  Patient presents with  . Fever     (Consider location/radiation/quality/duration/timing/severity/associated sxs/prior Treatment) Patient is a 38 y.o. male presenting with fever. The history is provided by the patient (the pt is at a nursing home and being treated with iv antibiotics for osteomyelitis.   the pa at the nh call ID because the pt stated he had a fever of 102.  the ID md stated to get blood cultures.  the pa sent the pt to the er for evaluation).  Fever Temp source:  Subjective Severity:  Moderate Onset quality:  Sudden Timing:  Sporadic Progression:  Resolved Chronicity:  Recurrent Relieved by:  Nothing Associated symptoms: no chest pain, no congestion, no cough, no diarrhea, no headaches and no rash     Past Medical History  Diagnosis Date  . HIV disease   . Cirrhosis   . PTSD (post-traumatic stress disorder)   . Depressive disorder   . Cancer   . Lymphoma   . Anemia    Past Surgical History  Procedure Laterality Date  . Cholecystectomy    . Appendectomy    . Tee without cardioversion N/A 11/13/2013    Procedure: TRANSESOPHAGEAL ECHOCARDIOGRAM (TEE);  Surgeon: Dorothy Spark, MD;  Location: [place];  Service: Cardiovascular;  Laterality: N/A;  . Infection of spine     Family History  Problem Relation Age of Onset  . Cancer Maternal Grandmother     breast ca  . Cancer Maternal Grandfather     colon ca   History  Substance Use Topics  . Smoking status: Never Smoker   . Smokeless tobacco: Never Used  . Alcohol Use: No    Review of Systems  Constitutional: Positive for fever. Negative for appetite change and fatigue.  HENT: Negative for congestion, ear discharge and sinus pressure.   Eyes: Negative for discharge.  Respiratory: Negative for cough.   Cardiovascular: Negative for chest pain.   Gastrointestinal: Negative for abdominal pain and diarrhea.  Genitourinary: Negative for frequency and hematuria.  Musculoskeletal: Negative for back pain.  Skin: Negative for rash.  Neurological: Negative for seizures and headaches.  Psychiatric/Behavioral: Negative for hallucinations.      Allergies  Codeine and Oxycodone  Home Medications   Prior to Admission medications   Medication Sig Start Date End Date Taking? Authorizing Provider  atovaquone (MEPRON) 750 MG/5ML suspension Take 10 mLs (1,500 mg total) by mouth daily. 01/01/14   Truman Hayward, MD  azithromycin [facility]) 600 MG tablet Take 2 tablets (1,200 mg total) by mouth every Friday. 12/18/13   Geradine Girt, DO  buPROPion (WELLBUTRIN XL) 150 MG 24 hr tablet Take 150 mg by mouth daily.    Historical Provider, MD  buPROPion (WELLBUTRIN) 75 MG tablet Take 75 mg by mouth daily.    Historical Provider, MD  cefTAZidime 2 g in dextrose 5 % 50 mL Inject 2 g into the vein every 8 (eight) hours. 12/18/13   Geradine Girt, DO  clonazePAM (KLONOPIN) 0.5 MG tablet Take 0.5 mg by mouth 2 (two) times daily as needed for anxiety.    Historical Provider, MD  cyclobenzaprine (FLEXERIL) 5 MG tablet Take 1 tablet (5 mg total) by mouth at bedtime. 12/18/13   Geradine Girt, DO  DAPTOmycin 700 mg in sodium chloride 0.9 % 100 mL Inject 700  mg into the vein daily. 12/18/13   Geradine Girt, DO  Darunavir Ethanolate (PREZISTA) 800 MG tablet Take 1 tablet (800 mg total) by mouth daily. 01/01/14   Truman Hayward, MD  emtricitabine-tenofovir (TRUVADA) 200-300 MG per tablet Take 1 tablet by mouth at bedtime. 01/01/14   Truman Hayward, MD  gabapentin (NEURONTIN) 300 MG capsule Take 300 mg by mouth 3 (three) times daily.    Historical Provider, MD  methocarbamol (ROBAXIN) 500 MG tablet Take 1 tablet (500 mg total) by mouth every 6 (six) hours as needed for muscle spasms. 12/19/13   Geradine Girt, DO  morphine (MS CONTIN) 15 MG 12 hr tablet  Take 15 mg by mouth every 12 (twelve) hours.    Historical Provider, MD  morphine (MSIR) 15 MG tablet Take 15 mg by mouth every 4 (four) hours as needed for severe pain.    Historical Provider, MD  omeprazole (PRILOSEC) 20 MG capsule Take 20 mg by mouth 2 (two) times daily.    Historical Provider, MD  potassium chloride SA (K-DUR,KLOR-CON) 20 MEQ tablet Take 20 mEq by mouth daily.    Historical Provider, MD  promethazine (PHENERGAN) 12.5 MG tablet Take 12.5 mg by mouth every 6 (six) hours as needed for nausea or vomiting.    Historical Provider, MD  ritonavir (NORVIR) 100 MG capsule Take 1 capsule (100 mg total) by mouth daily with breakfast. With Prezista 01/01/14   Truman Hayward, MD  sucralfate (CARAFATE) 1 G tablet Take 1 g by mouth 3 (three) times daily with meals.     Historical Provider, MD  Vitamin K, Phytonadione, 100 MCG TABS Take 100 mcg by mouth daily.    Historical Provider, MD   BP 121/72  Pulse 100  Temp(Src) 99.6 F (37.6 C) (Oral)  Resp 20  Ht 5\' 9"  (1.753 m)  Wt 194 lb (87.998 kg)  BMI 28.64 kg/m2  SpO2 100% Physical Exam  Constitutional: He is oriented to person, place, and time. He appears well-developed.  HENT:  Head: Normocephalic.  Eyes: Conjunctivae and EOM are normal. No scleral icterus.  Neck: Neck supple. No thyromegaly present.  Cardiovascular: Normal rate and regular rhythm.  Exam reveals no gallop and no friction rub.   No murmur heard. Pulmonary/Chest: No stridor. He has no wheezes. He has no rales. He exhibits no tenderness.  Abdominal: He exhibits no distension. There is no tenderness. There is no rebound.  Musculoskeletal: Normal range of motion. He exhibits no edema.  picc line right arm appears normal  Lymphadenopathy:    He has no cervical adenopathy.  Neurological: He is oriented to person, place, and time. He exhibits normal muscle tone. Coordination normal.  Skin: No rash noted. No erythema.  Psychiatric: He has a normal mood and affect.  His behavior is normal.    ED Course  Procedures (including critical care time) Labs Review Labs Reviewed  CBC WITH DIFFERENTIAL - Abnormal; Notable for the following:    WBC 1.3 (*)    RBC 2.18 (*)    Hemoglobin 8.2 (*)    HCT 22.9 (*)    MCV 105.0 (*)    MCH 37.6 (*)    Platelets 58 (*)    Neutro Abs 0.7 (*)    Lymphs Abs 0.3 (*)    Monocytes Relative 17 (*)    All other components within normal limits  BASIC METABOLIC PANEL - Abnormal; Notable for the following:    Sodium 135 (*)  Glucose, Bld 116 (*)    Calcium 8.2 (*)    All other components within normal limits  URINALYSIS, ROUTINE W REFLEX MICROSCOPIC - Abnormal; Notable for the following:    Color, Urine ORANGE (*)    Bilirubin Urine SMALL (*)    All other components within normal limits  CULTURE, BLOOD (ROUTINE X 2)  CULTURE, BLOOD (ROUTINE X 2)  URINE CULTURE    Imaging Review Dg Chest 2 View  01/05/2014   CLINICAL DATA:  One week history of fever. Leukopenia. Current history of HIV, hepatic cirrhosis and lymphoma.  EXAM: CHEST  2 VIEW  COMPARISON:  Portable chest x-ray 01/01/2014, 12/16/2013, 10/16/2013. One view chest x-ray 11/06/2013.  FINDINGS: Suboptimal inspiration accounts for crowded bronchovascular markings, especially in the bases, and accentuates the cardiac silhouette. Taking this into account, cardiac silhouette upper normal in size, stable. Hilar and mediastinal contours otherwise unremarkable. Lungs clear. Bronchovascular markings normal. Pulmonary vascularity normal. No visible pleural effusions. No pneumothorax. The right arm PICC tip in the lower SVC. Visualized bony thorax intact.  IMPRESSION: Suboptimal inspiration.  No acute cardiopulmonary disease.   Electronically Signed   By: Evangeline Dakin M.D.   On: 01/05/2014 19:04     EKG Interpretation None     I spoke to Dr. Megan Salon ID and he stated not to change antibiotic tx unless the blood cultures show it is needed.   MDM   Final  diagnoses:  Osteomyelitis   Pt to go back to [facility]    The chart was scribed for me under my direct supervision.  I personally performed the history, physical, and medical decision making and all procedures in the evaluation of this patient.Maudry Diego, MD 01/05/14 [phone]

## 2014-01-05 NOTE — ED Notes (Signed)
Gave pt urinal in attempt to obtain a specimen.

## 2014-01-05 NOTE — Progress Notes (Signed)
Patient ID: Samuel Schultz, male   DOB: 31-May-1976, 38 y.o.   MRN: [id number]   This is an acute visit.  Level of care skilled.  Facility [facility].  Chief complaint-acute visit secondary to fever of unknown origin.  History of present illness.  : Samuel Schultz is a very complicated man who came here predominantly for treatment and completion of antibiotics for an extensive L3-L4 diskitis. He has been on [place] and daptomycin.  He has underlying HIV, on medications as directed by Infectious Disease.  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  The patient has had progressive pancytopenia --. On lab on July 29 WBC was 1.3 hemoglobin 7.5 platelets 42,000.    He has been complaining of increased right leg pain-also noted he had a low-grade temperature frequently over 100.0-nursing tells me it was 101 last night.   when I later spoke with the patient he stated he had a temperature of over 102.  He said he was diaphoretic.  Currently he is afebrile and vital signs are stable-  Family medical social history has been reviewed. per previous progress notes including admission note on 12/20/2013.  Medications have been reviewed.  Review of systems.  In general has had a fever as noted above.  Skin is not complaining of any itching.  Respiratory not complaining of cough or shortness of breath.  Cardiac does not complaining of chest pain.  GI is not complaining of abdominal pain per nursing there has not been diarrhea.  Muscle skeletal is complaining of right leg pain.  Neurologic does not complaining of headache or dizziness.  Physical exam.  Temperature 97.3 pulse 93 respirations of 18 blood pressure 111/57.  In general this is a young male appears somewhat lethargic but certainly more responsive as the exam progressed.  His skin is pale but dry.  PICC line right upper arm does not appear to be infected there is no  drainage or bleeding or erythema.  Heart is regular rate and rhythm without murmur gallop or rub.  Chest is clear to auscultation no labored breathing.  Abdomen is soft does not appear to be acutely tender there are positive bowel sounds.  GU could not really appreciate suprapubic tenderness.  Muscle skeletal is able to move all extremities x4 however with flexion and extension of his right leg he says he has significant pain I do not see any deformity.  Neurologic appears grossly intact his speech is clear cranial nerves intact.  Psych he continues to be alert and oriented.  Labs.  01/03/2014.  WBC 1.3 hemoglobin 7.5 platelets 42,000.  Sodium 134 potassium 4.4 BUN 7 creatinine 0.62.  Assessment and plan.  Fever of unknown origin-was able to contact Dr. Megan Salon infectious disease-I discussed patient's clinical picture he suggested obtaining blood cultures as well as a urine and blood work including a CBC-.  I addressed this with the patient as well-he stated he was concerned enough to want to go to the hospital last night-.  At this point for the sake of expediency  will send patient to the ER  for evaluation where  lab work will be obtained expediently-along with an assessment I suspect a chest x-ray and urine will be obtained as well.   addendum -- I did speak with the ER physician who stated he will obtain these studies-his initial evaluation was the patient did not appear to be septic- will await the studies   [id number]-of note  more than 35 minutes spent assessing patient-and coordinating and formulating a plan of care-of note greater than 50% of time spent coordinating plan of care  .  -      .

## 2014-01-05 NOTE — Discharge Instructions (Signed)
Continue present medicines and follow results of blood cultures

## 2014-01-07 LAB — CULTURE, BLOOD (ROUTINE X 2)
CULTURE: NO GROWTH
Culture: NO GROWTH

## 2014-01-07 LAB — URINE CULTURE
Colony Count: NO GROWTH
Culture: NO GROWTH

## 2014-01-08 ENCOUNTER — Other Ambulatory Visit (HOSPITAL_BASED_OUTPATIENT_CLINIC_OR_DEPARTMENT_OTHER): Payer: Self-pay | Admitting: Internal Medicine

## 2014-01-08 ENCOUNTER — Ambulatory Visit (HOSPITAL_COMMUNITY)
Admission: RE | Admit: 2014-01-08 | Discharge: 2014-01-08 | Disposition: A | Discharge status: Home / Self Care | Payer: Medicaid Other | Source: Ambulatory Visit | Attending: Internal Medicine | Admitting: Internal Medicine

## 2014-01-08 ENCOUNTER — Other Ambulatory Visit: Payer: Self-pay | Admitting: *Deleted

## 2014-01-08 DIAGNOSIS — M869 Osteomyelitis, unspecified: Secondary | ICD-10-CM

## 2014-01-08 MED ORDER — MORPHINE SULFATE ER 30 MG PO TBCR: EXTENDED_RELEASE_TABLET | ORAL | Status: DC

## 2014-01-08 NOTE — Telephone Encounter (Signed)
[facility] 

## 2014-01-10 ENCOUNTER — Telehealth: Payer: Self-pay | Admitting: *Deleted

## 2014-01-10 ENCOUNTER — Encounter: Payer: Self-pay | Admitting: Infectious Disease

## 2014-01-10 LAB — CULTURE, BLOOD (ROUTINE X 2)
Culture: NO GROWTH
Culture: NO GROWTH

## 2014-01-10 NOTE — Telephone Encounter (Signed)
Suanne Marker, Rn taking care of the patient at [facility] center to advise that the patient is routinly refusing to take his Mepron. She just wanted to let Dr Tommy Medal know and she also needed the fax number to send the patients lab work.

## 2014-01-15 ENCOUNTER — Inpatient Hospital Stay: Payer: Medicaid Other | Admitting: Infectious Disease

## 2014-01-19 ENCOUNTER — Telehealth: Payer: Self-pay | Admitting: Licensed Clinical Social Worker

## 2014-01-19 NOTE — Telephone Encounter (Signed)
RN at [facility] called a critical lab value in for Hemoglobin 6.9, advised RN that patient's pcp at the nursing center would need to address critical lab. PCP was out of town but [place], [place] will address critical lab.

## 2014-01-23 LAB — AFB CULTURE WITH SMEAR (NOT AT ARMC): ACID FAST SMEAR: NONE SEEN

## 2014-01-25 ENCOUNTER — Non-Acute Institutional Stay (SKILLED_NURSING_FACILITY): Payer: Medicaid Other | Admitting: Internal Medicine

## 2014-01-25 ENCOUNTER — Encounter: Payer: Self-pay | Admitting: Infectious Disease

## 2014-01-25 DIAGNOSIS — M4646 Discitis, unspecified, lumbar region: Secondary | ICD-10-CM

## 2014-01-25 DIAGNOSIS — M519 Unspecified thoracic, thoracolumbar and lumbosacral intervertebral disc disorder: Secondary | ICD-10-CM

## 2014-01-25 DIAGNOSIS — D61818 Other pancytopenia: Secondary | ICD-10-CM

## 2014-01-25 NOTE — Progress Notes (Signed)
Patient ID: Samuel Schultz, male   DOB: May 20, 1976, 38 y.o.   MRN: [id number]   This is an acute visit.  Level of care skilled.  Facility [facility].  She complaint-acute visit followup pancytopenia-right lower extremity pain.  History of present illness.  Pa: Mr. Schultz is a very complicated man who came here predominantly for treatment and completion of antibiotics for an extensive L3-L4 diskitis. He has been on [place] and daptomycin.  He has underlying HIV, on medications as directed by Infectious Disease.  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  The patient has had progressive pancytopenia --this has stabilized somewhat on recent lab of the white count 1.5-    he continues to complain of extensive right lower extremity pain-his pain medication has been titrated up he is now on long-acting morphine 30 mg twice a day as well as 15 mg of morphine every 4 hours when necessary pain-he also continues on Robaxin every 6 hours when necessary and has Klonopin 0.5 mg for anxiety  Patient does have a history of low blood pressures and this has complicated administrating his pain medication at times secondary to concerns over sedation.   patient did have an MRI scheduled by infectious disease however apparently patient refused the MRI apparently there was some anxiety involved here-he also apparently deferred his most recent appointment with infectious disease.  Apparently he had some diarrhea this morning which resolved later in the day.  His main complaint continues to be right lower extremity pain.  Family medical social history as been reviewed per previous progress notes including 01/05/2014.  Medications have been reviewed per MAR.  Review of systems.  In general is not really complaining of fever chills.  Respiratory is not complaining of shortness of breath.  Cardiac does not complaining of chest pain.  GI has  complained of some diarrhea but not specifically abdominal discomfort.  Muscle skeletal is complaining of fairly extensive right lower extremity pain it's been going on for some time.  Neurologic does not really complain of numbness here.  Psych he does have a history of significant anxiety and appears quite anxious today.  Physical exam.  Temperature is 98.2 pulse 90 respirations 20 blood pressure 102/52.  In general this is a young male lying in bed he does appear somewhat anxious.  His skin is warm and dry.  Chest is clear to auscultation no labored breathing.  Heart is regular rate and rhythm without murmur gallop or rub.  Abdomen is soft does not appear to be tender there are positive bowel sounds.  Muscle skeletal-is able to move all extremities however limited right lower extremity movement--holds in a somewhat contracted position.--Complaining of pain here.  Neurologic appears grossly intact no lateralizing findings again he does have right leg discomfort it appears.  Psych he is alert and oriented quite anxious.  Labs.  01/24/2014.  Sodium 134 potassium 3.5 BUN 7 creatinine 0.48.  WBC 1.5 hemoglobin 7.0 platelets 83,000.  Assessment and plan.  #1-history of right leg pain-this is complicated somewhat patient did not receive his long-acting morphine earlier today because of concerns of hypotension with blood pressure systolically under [id number] give the short acting morphine at a shorter interval today to hopefully give him some more immediate relief-later in the day as well patient was asking for an extra dose of Klonopin --secondary to significant anxiety did allow this.  Nursing staff also discussed followup of MRI with infectious disease  today-as noted MRI  has been attempted previously-infectious disease would like to see the patient in office before proceeding  with  the MRI  #2 chronic pancytopenia-this appears to have stabilized WBC was actually got below 1  at one point-we'll need to update lab work next week.  Of note is continues to be a challenging situation and will have to be monitored closely-at this point patient does not appear to be in acute distress.  CPT  [phone]  y

## 2014-01-26 ENCOUNTER — Inpatient Hospital Stay (HOSPITAL_COMMUNITY)
Admission: EM | Admit: 2014-01-26 | Discharge: 2014-02-05 | DRG: 094 | Disposition: A | Discharge status: Skilled Nursing Facility | Payer: Medicaid Other | Attending: Internal Medicine | Admitting: Internal Medicine

## 2014-01-26 ENCOUNTER — Encounter (HOSPITAL_COMMUNITY): Payer: Self-pay | Admitting: Emergency Medicine

## 2014-01-26 ENCOUNTER — Telehealth: Payer: Self-pay | Admitting: *Deleted

## 2014-01-26 ENCOUNTER — Emergency Department (HOSPITAL_COMMUNITY): Payer: Medicaid Other

## 2014-01-26 DIAGNOSIS — B20 Human immunodeficiency virus [HIV] disease: Secondary | ICD-10-CM | POA: Diagnosis present

## 2014-01-26 DIAGNOSIS — Z9221 Personal history of antineoplastic chemotherapy: Secondary | ICD-10-CM

## 2014-01-26 DIAGNOSIS — F3289 Other specified depressive episodes: Secondary | ICD-10-CM | POA: Diagnosis present

## 2014-01-26 DIAGNOSIS — C8589 Other specified types of non-Hodgkin lymphoma, extranodal and solid organ sites: Secondary | ICD-10-CM | POA: Diagnosis present

## 2014-01-26 DIAGNOSIS — N179 Acute kidney failure, unspecified: Secondary | ICD-10-CM

## 2014-01-26 DIAGNOSIS — D684 Acquired coagulation factor deficiency: Secondary | ICD-10-CM | POA: Diagnosis present

## 2014-01-26 DIAGNOSIS — Z79899 Other long term (current) drug therapy: Secondary | ICD-10-CM

## 2014-01-26 DIAGNOSIS — M519 Unspecified thoracic, thoracolumbar and lumbosacral intervertebral disc disorder: Secondary | ICD-10-CM | POA: Diagnosis present

## 2014-01-26 DIAGNOSIS — F329 Major depressive disorder, single episode, unspecified: Secondary | ICD-10-CM | POA: Diagnosis present

## 2014-01-26 DIAGNOSIS — G062 Extradural and subdural abscess, unspecified: Secondary | ICD-10-CM

## 2014-01-26 DIAGNOSIS — G061 Intraspinal abscess and granuloma: Principal | ICD-10-CM | POA: Diagnosis present

## 2014-01-26 DIAGNOSIS — M4646 Discitis, unspecified, lumbar region: Secondary | ICD-10-CM

## 2014-01-26 DIAGNOSIS — D61818 Other pancytopenia: Secondary | ICD-10-CM | POA: Diagnosis present

## 2014-01-26 DIAGNOSIS — F4310 Post-traumatic stress disorder, unspecified: Secondary | ICD-10-CM | POA: Diagnosis present

## 2014-01-26 DIAGNOSIS — C8580 Other specified types of non-Hodgkin lymphoma, unspecified site: Secondary | ICD-10-CM | POA: Diagnosis present

## 2014-01-26 DIAGNOSIS — E43 Unspecified severe protein-calorie malnutrition: Secondary | ICD-10-CM

## 2014-01-26 DIAGNOSIS — K7460 Unspecified cirrhosis of liver: Secondary | ICD-10-CM | POA: Diagnosis present

## 2014-01-26 LAB — COMPREHENSIVE METABOLIC PANEL
ALK PHOS: 78 U/L (ref 39–117)
ALT: 8 U/L (ref 0–53)
ANION GAP: 11 (ref 5–15)
AST: 25 U/L (ref 0–37)
Albumin: 2.1 g/dL — ABNORMAL LOW (ref 3.5–5.2)
BILIRUBIN TOTAL: 3 mg/dL — AB (ref 0.3–1.2)
BUN: 7 mg/dL (ref 6–23)
CHLORIDE: 101 meq/L (ref 96–112)
CO2: 21 meq/L (ref 19–32)
CREATININE: 0.55 mg/dL (ref 0.50–1.35)
Calcium: 8.3 mg/dL — ABNORMAL LOW (ref 8.4–10.5)
Glucose, Bld: 91 mg/dL (ref 70–99)
POTASSIUM: 3.9 meq/L (ref 3.7–5.3)
Sodium: 133 mEq/L — ABNORMAL LOW (ref 137–147)
Total Protein: 7.4 g/dL (ref 6.0–8.3)

## 2014-01-26 LAB — CBC WITH DIFFERENTIAL/PLATELET
Basophils Absolute: 0 10*3/uL (ref 0.0–0.1)
Basophils Relative: 0 % (ref 0–1)
Eosinophils Absolute: 0.1 10*3/uL (ref 0.0–0.7)
Eosinophils Relative: 2 % (ref 0–5)
HCT: 22.9 % — ABNORMAL LOW (ref 39.0–52.0)
HEMOGLOBIN: 7.8 g/dL — AB (ref 13.0–17.0)
LYMPHS ABS: 0.6 10*3/uL — AB (ref 0.7–4.0)
Lymphocytes Relative: 26 % (ref 12–46)
MCH: 35.3 pg — ABNORMAL HIGH (ref 26.0–34.0)
MCHC: 34.1 g/dL (ref 30.0–36.0)
MCV: 103.6 fL — ABNORMAL HIGH (ref 78.0–100.0)
MONOS PCT: 10 % (ref 3–12)
Monocytes Absolute: 0.2 10*3/uL (ref 0.1–1.0)
NEUTROS ABS: 1.4 10*3/uL — AB (ref 1.7–7.7)
NEUTROS PCT: 61 % (ref 43–77)
Platelets: 94 10*3/uL — ABNORMAL LOW (ref 150–400)
RBC: 2.21 MIL/uL — ABNORMAL LOW (ref 4.22–5.81)
RDW: 15.8 % — ABNORMAL HIGH (ref 11.5–15.5)
WBC: 2.3 10*3/uL — ABNORMAL LOW (ref 4.0–10.5)

## 2014-01-26 MED ORDER — ONDANSETRON HCL 4 MG/2ML IJ SOLN
4.0000 mg | Freq: Once | INTRAMUSCULAR | Status: AC
  Administered 2014-01-26: 4 mg via INTRAVENOUS
  Filled 2014-01-26: qty 2

## 2014-01-26 MED ORDER — LORAZEPAM 2 MG/ML IJ SOLN
1.0000 mg | Freq: Once | INTRAMUSCULAR | Status: AC
  Administered 2014-01-26: 1 mg via INTRAVENOUS
  Filled 2014-01-26: qty 1

## 2014-01-26 MED ORDER — SODIUM CHLORIDE 0.9 % IV SOLN: 700.0000 mg | Freq: Once | INTRAVENOUS | Status: DC

## 2014-01-26 MED ORDER — HYDROMORPHONE HCL PF 1 MG/ML IJ SOLN
1.0000 mg | Freq: Once | INTRAMUSCULAR | Status: AC
  Administered 2014-01-27: 1 mg via INTRAVENOUS
  Filled 2014-01-26: qty 1

## 2014-01-26 MED ORDER — CEFTAZIDIME 1 G IJ SOLR
1.0000 g | Freq: Once | INTRAMUSCULAR | Status: AC
  Administered 2014-01-26: 1 g via INTRAVENOUS
  Filled 2014-01-26: qty 1

## 2014-01-26 MED ORDER — GADOBENATE DIMEGLUMINE 529 MG/ML IV SOLN
19.0000 mL | Freq: Once | INTRAVENOUS | Status: AC | PRN
  Administered 2014-01-26: 19 mL via INTRAVENOUS

## 2014-01-26 MED ORDER — HYDROMORPHONE HCL PF 1 MG/ML IJ SOLN
1.0000 mg | Freq: Once | INTRAMUSCULAR | Status: AC
  Administered 2014-01-26: 1 mg via INTRAVENOUS
  Filled 2014-01-26: qty 1

## 2014-01-26 MED ORDER — SODIUM CHLORIDE 0.9 % IV SOLN
700.0000 mg | INTRAVENOUS | Status: DC
  Administered 2014-01-27 – 2014-02-05 (×9): 700 mg via INTRAVENOUS
  Filled 2014-01-26 (×15): qty 14

## 2014-01-26 NOTE — ED Notes (Signed)
Per pt sts that he was sent here for infection in back and pain. Pt currently being treated at [facility] facility. sts also he thinks that his hgb is low.

## 2014-01-26 NOTE — Telephone Encounter (Signed)
He will be sent to [facility] for admission via ED

## 2014-01-26 NOTE — Progress Notes (Signed)
ANTIBIOTIC CONSULT NOTE - INITIAL  Pharmacy Consult for Daptomycin Indication: Bacteremia   Allergies  Allergen Reactions  . Codeine Anaphylaxis, Hives, Nausea And Vomiting, Nausea Only and Swelling  . Oxycodone Other (See Comments)    Other reaction(s): Dizziness Lightheaded, nausea    Patient Measurements:   Adjusted Body Weight: 88kg  Vital Signs: Temp: 99.2 F (37.3 C) (08/21 1334) Temp src: Oral (08/21 1334) BP: 105/56 mmHg (08/21 1334) Pulse Rate: 101 (08/21 1334) Intake/Output from previous day:   Intake/Output from this shift:    Labs: No results found for this basename: WBC, HGB, PLT, LABCREA, CREATININE,  in the last 72 hours The CrCl is unknown because both a height and weight (above a minimum accepted value) are required for this calculation. No results found for this basename: VANCOTROUGH, Corlis Leak, VANCORANDOM, [place], GENTPEAK, GENTRANDOM, TOBRATROUGH, TOBRAPEAK, TOBRARND, AMIKACINPEAK, AMIKACINTROU, AMIKACIN,  in the last 72 hours   Microbiology: Recent Results (from the past 720 hour(s))  URINE CULTURE     Status: None   Collection Time    01/01/14  3:58 PM      Result Value Ref Range Status   Specimen Description URINE, CLEAN CATCH   Final   Special Requests NONE   Final   Culture  Setup Time     Final   Value: 01/01/2014 20:43     Performed at [facility]     Final   Value: NO GROWTH     Performed at [facility]   Culture     Final   Value: NO GROWTH     Performed at [facility]   Report Status 01/02/2014 FINAL   Final  CULTURE, BLOOD (ROUTINE X 2)     Status: None   Collection Time    01/01/14  4:25 PM      Result Value Ref Range Status   Specimen Description BLOOD LEFT ARM   Final   Special Requests BOTTLES DRAWN AEROBIC AND ANAEROBIC 10CC   Final   Culture  Setup Time     Final   Value: 01/01/2014 22:07     Performed at [facility]   Culture     Final   Value: NO GROWTH 5 DAYS     Performed at [facility]   Report Status 01/07/2014 FINAL   Final  CULTURE, BLOOD (ROUTINE X 2)     Status: None   Collection Time    01/01/14  4:31 PM      Result Value Ref Range Status   Specimen Description BLOOD LEFT HAND   Final   Special Requests BOTTLES DRAWN AEROBIC ONLY 10CC   Final   Culture  Setup Time     Final   Value: 01/01/2014 22:06     Performed at [facility]   Culture     Final   Value: NO GROWTH 5 DAYS     Performed at [facility]   Report Status 01/07/2014 FINAL   Final  CULTURE, BLOOD (ROUTINE X 2)     Status: None   Collection Time    01/05/14  7:10 PM      Result Value Ref Range Status   Specimen Description BLOOD LEFT ANTECUBITAL   Final   Special Requests BOTTLES DRAWN AEROBIC AND ANAEROBIC [place]   Final   Culture NO GROWTH 5 DAYS   Final   Report Status 01/10/2014 FINAL   Final  CULTURE, BLOOD (ROUTINE X 2)  Status: None   Collection Time    01/05/14  7:38 PM      Result Value Ref Range Status   Specimen Description BLOOD LEFT ANTECUBITAL   Final   Special Requests BOTTLES DRAWN AEROBIC AND ANAEROBIC 6CC   Final   Culture NO GROWTH 5 DAYS   Final   Report Status 01/10/2014 FINAL   Final  URINE CULTURE     Status: None   Collection Time    01/05/14  7:56 PM      Result Value Ref Range Status   Specimen Description URINE, CLEAN CATCH   Final   Special Requests Immunocompromised   Final   Culture  Setup Time     Final   Value: 01/06/2014 19:39     Performed at [facility]     Final   Value: NO GROWTH     Performed at [facility]   Culture     Final   Value: NO GROWTH     Performed at [facility]   Report Status 01/07/2014 FINAL   Final    Medical History: Past Medical History  Diagnosis Date  . HIV disease   . Cirrhosis   . PTSD (post-traumatic stress disorder)   . Depressive disorder   . Cancer   . Lymphoma   . Anemia     Assessment: 6 YOF with hx of HIV  currently being treated with Tressie Ellis and daptomycin at [facility] for discitis. He has worsening lower back pain, and LE pain and weakness. ID doctor recommended pt to be admitted for L-spine MRI. Pharmacy is consulted to continue daptomycin. MD ordered fortaz x 1. I called [facility], pt and confirmed with RN that pt. Has received a dose this morning. Blood culture, CBC, BMET are pending. Most recent CK (32) wnl on 7/14   Plan:  - Daptomycin 700 (8mg /kg) daily, next dose tomorrow morning. - Check CK with AM labs. - f/u blood culture and clinical course - Check weekly CK if continues - f/u plans for continue fortaz  Maryanna Shape, PharmD, BCPS  Clinical Pharmacist  Pager: [phone]   01/26/2014,4:54 PM

## 2014-01-26 NOTE — Telephone Encounter (Signed)
RN at [facility] was given your pager number to discuss this patient.

## 2014-01-26 NOTE — ED Provider Notes (Signed)
CSN: [id number]     Arrival date & time 01/26/14  1321 History   First MD Initiated Contact with Patient 01/26/14 1536     Chief Complaint  Patient presents with  . Back Pain  . Wound Infection     (Consider location/radiation/quality/duration/timing/severity/associated sxs/prior Treatment) Patient is a 38 y.o. male presenting with back pain. The history is provided by the patient.  Back Pain Location:  Lumbar spine Quality:  Aching and stabbing Radiates to:  Does not radiate Pain severity:  Severe Pain is:  Same all the time Onset quality:  Gradual Duration:  2 months Timing:  Constant Progression:  Unchanged Chronicity:  Chronic Relieved by:  Nothing Worsened by:  Nothing tried Ineffective treatments:  None tried Associated symptoms: leg pain and weakness   Associated symptoms: no abdominal pain, no bladder incontinence, no bowel incontinence, no chest pain, no fever, no headaches and no numbness    38 yo M with a chief complaint of worsening low back pain. Patient with a history of L3-L4 discitis currently being treated with Fortaz and daptomycin. Patient has a history HIV last CD4 count was 20. Patient with worsening low back pain radiating down his right leg. Patient states that he has some weakness in that leg though is unable to differentiate between weakness versus increased pain with movement. Patient continues to have fevers at night. Patient called his infectious disease doctor who recommended he come here for a L-spine MRI and admission to the hospital. Denies loss of bladder, has had some loss of bowel recently.  Past Medical History  Diagnosis Date  . HIV disease   . Cirrhosis   . PTSD (post-traumatic stress disorder)   . Depressive disorder   . Cancer   . Lymphoma   . Anemia    Past Surgical History  Procedure Laterality Date  . Cholecystectomy    . Appendectomy    . Tee without cardioversion N/A 11/13/2013    Procedure: TRANSESOPHAGEAL ECHOCARDIOGRAM (TEE);   Surgeon: Dorothy Spark, MD;  Location: [facility];  Service: Cardiovascular;  Laterality: N/A;  . Infection of spine     Family History  Problem Relation Age of Onset  . Cancer Maternal Grandmother     breast ca  . Cancer Maternal Grandfather     colon ca   History  Substance Use Topics  . Smoking status: Never Smoker   . Smokeless tobacco: Never Used  . Alcohol Use: No    Review of Systems  Constitutional: Negative for fever and chills.  HENT: Negative for congestion and facial swelling.   Eyes: Negative for discharge and visual disturbance.  Respiratory: Negative for shortness of breath.   Cardiovascular: Negative for chest pain and palpitations.  Gastrointestinal: Negative for vomiting, abdominal pain, diarrhea and bowel incontinence.  Genitourinary: Negative for bladder incontinence.  Musculoskeletal: Positive for back pain. Negative for arthralgias and myalgias.  Skin: Negative for color change and rash.  Neurological: Positive for weakness. Negative for tremors, syncope, numbness and headaches.  Psychiatric/Behavioral: Negative for confusion and dysphoric mood.      Allergies  Codeine and Oxycodone  Home Medications   Prior to Admission medications   Medication Sig Start Date End Date Taking? Authorizing Provider  atovaquone (MEPRON) 750 MG/5ML suspension Take 750 mg by mouth daily.   Yes Historical Provider, MD  azithromycin (ZITHROMAX) 600 MG tablet Take 1,200 mg by mouth once a week. saturday   Yes Historical Provider, MD  buPROPion (WELLBUTRIN XL) 300 MG 24 hr tablet  Take 300 mg by mouth daily.   Yes Historical Provider, MD  cefTRIAXone 1 g in dextrose 5 % 50 mL Inject 1 g into the vein every 8 (eight) hours.   Yes Historical Provider, MD  clonazePAM (KLONOPIN) 0.5 MG tablet Take 0.5 mg by mouth 2 (two) times daily as needed for anxiety.   Yes Historical Provider, MD  cyclobenzaprine (FLEXERIL) 5 MG tablet Take 1 tablet (5 mg total) by mouth at bedtime.  12/18/13  Yes Geradine Girt, DO  DAPTOmycin (CUBICIN IV) Inject 700 mg into the vein daily.   Yes Historical Provider, MD  Darunavir Ethanolate (PREZISTA) 800 MG tablet Take 1 tablet (800 mg total) by mouth daily. 01/01/14  Yes Truman Hayward, MD  emtricitabine-tenofovir (TRUVADA) 200-300 MG per tablet Take 1 tablet by mouth at bedtime. 01/01/14  Yes Truman Hayward, MD  gabapentin (NEURONTIN) 400 MG capsule Take 400 mg by mouth 3 (three) times daily.   Yes Historical Provider, MD  methocarbamol (ROBAXIN) 500 MG tablet Take 1 tablet (500 mg total) by mouth every 6 (six) hours as needed for muscle spasms. 12/19/13  Yes Geradine Girt, DO  morphine (MS CONTIN) 30 MG 12 hr tablet Take 30 mg by mouth every 12 (twelve) hours.   Yes Historical Provider, MD  morphine (MSIR) 15 MG tablet Take 15 mg by mouth every 4 (four) hours as needed for severe pain.   Yes Historical Provider, MD  omeprazole (PRILOSEC) 20 MG capsule Take 20 mg by mouth 2 (two) times daily.   Yes Historical Provider, MD  potassium chloride SA (K-DUR,KLOR-CON) 20 MEQ tablet Take 20 mEq by mouth daily.   Yes Historical Provider, MD  promethazine (PHENERGAN) 12.5 MG tablet Take 12.5 mg by mouth every 6 (six) hours as needed for nausea or vomiting.   Yes Historical Provider, MD  ritonavir (NORVIR) 100 MG capsule Take 1 capsule (100 mg total) by mouth daily with breakfast. With Prezista 01/01/14  Yes Truman Hayward, MD  sucralfate (CARAFATE) 1 G tablet Take 1 g by mouth 3 (three) times daily with meals.    Yes Historical Provider, MD  temazepam (RESTORIL) 7.5 MG capsule Take 7.5 mg by mouth at bedtime as needed for sleep.   Yes Historical Provider, MD  Vitamin K, Phytonadione, 100 MCG TABS Take 100 mcg by mouth daily.   Yes Historical Provider, MD   BP 114/57  Pulse 93  Temp(Src) 99.2 F (37.3 C) (Oral)  Resp 16  SpO2 96% Physical Exam  Constitutional: He is oriented to person, place, and time. He appears well-developed and  well-nourished.  HENT:  Head: Normocephalic and atraumatic.  Eyes: EOM are normal. Pupils are equal, round, and reactive to light.  Neck: Normal range of motion. Neck supple. No JVD present.  Cardiovascular: Normal rate and regular rhythm.  Exam reveals no gallop and no friction rub.   No murmur heard. Pulmonary/Chest: No respiratory distress. He has no wheezes.  Abdominal: He exhibits no distension. There is no rebound and no guarding.  Genitourinary:  Intact perirectal sensation  Musculoskeletal: Normal range of motion.  TTP about the knee and L4 L5 area and L-spine. Palpation about the right greater trochanter. 4/5 muscle strength in right lower extremity.  Neurological: He is alert and oriented to person, place, and time.  Skin: No rash noted. No pallor.  Psychiatric: He has a normal mood and affect. His behavior is normal.    ED Course  Procedures (including critical  care time) Labs Review Labs Reviewed  CBC WITH DIFFERENTIAL - Abnormal; Notable for the following:    WBC 2.3 (*)    RBC 2.21 (*)    Hemoglobin 7.8 (*)    HCT 22.9 (*)    MCV 103.6 (*)    MCH 35.3 (*)    RDW 15.8 (*)    Platelets 94 (*)    Neutro Abs 1.4 (*)    Lymphs Abs 0.6 (*)    All other components within normal limits  COMPREHENSIVE METABOLIC PANEL - Abnormal; Notable for the following:    Sodium 133 (*)    Calcium 8.3 (*)    Albumin 2.1 (*)    Total Bilirubin 3.0 (*)    All other components within normal limits  CULTURE, BLOOD (ROUTINE X 2)  CULTURE, BLOOD (ROUTINE X 2)  CK    Imaging Review Mr Lumbar Spine W Wo Contrast  01/26/2014   CLINICAL DATA:  History of spinal infection. On antibiotics. Increasing back pain and right leg weakness. HIV, cirrhosis  EXAM: MRI LUMBAR SPINE WITHOUT AND WITH CONTRAST  TECHNIQUE: Multiplanar and multiecho pulse sequences of the lumbar spine were obtained without and with intravenous contrast.  CONTRAST:  42mL MULTIHANCE GADOBENATE DIMEGLUMINE 529 MG/ML IV SOLN   COMPARISON:  Lumbar MRI 12/10/2013  FINDINGS: Image quality degraded by technical factors and significant motion.  Discitis and osteomyelitis at L3-4 as noted on prior studies. Diffuse bone marrow edema is present similar to the prior MRI. There is progressive loss of disc space at this level due to infection. No epidural abscess at L3-4. No fracture.  New area of edema in the disc space at L5-S1 with mild adjacent bone marrow edema. Interval development of epidural abscess just below the L5-S1 level to the left of midline. This was not present previously and shows rim enhancement and central fluid signal intensity. No significant nerve root compression although the abscess may be touching the left S1 nerve root.  Negative for acute fracture. Conus medullaris not optimally seen on this study.  IMPRESSION: Disc space infection L3-4 progressive disc space narrowing but no epidural abscess at L3-4.  Interval development of epidural abscess to left of midline just below the L5-S1 disc space. Presumably the L5-S1 disc space is also infected at this time. The abscess was not present on prior studies.  These results were called by telephone at the time of interpretation on 01/26/2014 at 9:27 pm to Dr. Deno Etienne , who verbally acknowledged these results.   Electronically Signed   By: Franchot Gallo M.D.   On: 01/26/2014 21:29     EKG Interpretation None      MDM   Final diagnoses:  Epidural abscess    38 yo M with a chief complaint of low back pain radiating down his right leg. Patient ambulatory in the room states that the pain is severe difficulty with hip flexion patient with 4/5 muscle strength in the right lower extremity. Intact sensation. We'll obtain an MRI, blood cultures, give the patient dose of Tressie Ellis as he missed this earlier today.  Patient found to have an epidural abscess just to the left of midline by L5-S1. Neurosurgery consult will come and evaluate the patient. Have paged the infectious  disease team. I do like the patient admitted Will reevaluate in the morning for possible change of antibiotics. Neurosurgery evaluated feel that not currently cauda equina that may need surgery for continuing pain or possibility of re culturing for antibiotic change.  Deno Etienne, MD 01/26/14 [place], MD 01/26/14 2330

## 2014-01-26 NOTE — ED Notes (Signed)
Per ID pt to be seen by EDP and then admitted to hospitalist

## 2014-01-26 NOTE — Telephone Encounter (Signed)
You NEED your back imaged with an MRI perhaps you need to be admitted to the hospital to facilitate this. At present the only way to mak this happen is for your facility to bring you to the ED-and I think that is probably the right thing to do

## 2014-01-26 NOTE — Consult Note (Addendum)
Reason for Consult: Back and right leg pain Referring Physician: ER Samuel Schultz is an 38 y.o. male.  HPI: Patient is a 38 year old gentleman with known HIV infection and poor CD4 counts and neutropenia who has a known diagnosis of an L3-4 discitis who presents with back and right leg pain that's been on for 3-4 weeks denies any significant left leg pain denies any bladder difficulty has had a couple episodes losing control of his bowels. He denies any numbness or tingling in his legs or his feet he denies any numbness in his groin or his perineum. He has been seen and followed by Dr. Lucianne Lei dam and infectious disease and is been on [place] as well as a Zithromax and for management of his known discitis. He states recurrent back and right leg pain is been this way for approximately 3-4 weeks not getting any better or any worse. He has been living at a rehabilitation facility outside of [facility].  Past Medical History  Diagnosis Date  . HIV disease   . Cirrhosis   . PTSD (post-traumatic stress disorder)   . Depressive disorder   . Cancer   . Lymphoma   . Anemia     Past Surgical History  Procedure Laterality Date  . Cholecystectomy    . Appendectomy    . Tee without cardioversion N/A 11/13/2013    Procedure: TRANSESOPHAGEAL ECHOCARDIOGRAM (TEE);  Surgeon: Dorothy Spark, MD;  Location: [place];  Service: Cardiovascular;  Laterality: N/A;  . Infection of spine      Family History  Problem Relation Age of Onset  . Cancer Maternal Grandmother     breast ca  . Cancer Maternal Grandfather     colon ca    Social History:  reports that he has never smoked. He has never used smokeless tobacco. He reports that he does not drink alcohol or use illicit drugs.  Allergies:  Allergies  Allergen Reactions  . Codeine Anaphylaxis, Hives, Nausea And Vomiting, Nausea Only and Swelling  . Oxycodone Other (See Comments)    Other reaction(s): Dizziness Lightheaded, nausea     Medications: I have reviewed the patient's current medications.  Results for orders placed during the hospital encounter of 01/26/14 (from the past 48 hour(s))  CBC WITH DIFFERENTIAL     Status: Abnormal   Collection Time    01/26/14  4:48 PM      Result Value Ref Range   WBC 2.3 (*) 4.0 - 10.5 K/uL   RBC 2.21 (*) 4.22 - 5.81 MIL/uL   Hemoglobin 7.8 (*) 13.0 - 17.0 g/dL   HCT 22.9 (*) 39.0 - 52.0 %   MCV 103.6 (*) 78.0 - 100.0 fL   MCH 35.3 (*) 26.0 - 34.0 pg   MCHC 34.1  30.0 - 36.0 g/dL   RDW 15.8 (*) 11.5 - 15.5 %   Platelets 94 (*) 150 - 400 K/uL   Comment: PLATELET COUNT CONFIRMED BY SMEAR   Neutrophils Relative % 61  43 - 77 %   Neutro Abs 1.4 (*) 1.7 - 7.7 K/uL   Lymphocytes Relative 26  12 - 46 %   Lymphs Abs 0.6 (*) 0.7 - 4.0 K/uL   Monocytes Relative 10  3 - 12 %   Monocytes Absolute 0.2  0.1 - 1.0 K/uL   Eosinophils Relative 2  0 - 5 %   Eosinophils Absolute 0.1  0.0 - 0.7 K/uL   Basophils Relative 0  0 - 1 %  Basophils Absolute 0.0  0.0 - 0.1 K/uL  COMPREHENSIVE METABOLIC PANEL     Status: Abnormal   Collection Time    01/26/14  4:48 PM      Result Value Ref Range   Sodium 133 (*) 137 - 147 mEq/L   Potassium 3.9  3.7 - 5.3 mEq/L   Chloride 101  96 - 112 mEq/L   CO2 21  19 - 32 mEq/L   Glucose, Bld 91  70 - 99 mg/dL   BUN 7  6 - 23 mg/dL   Creatinine, Ser 0.55  0.50 - 1.35 mg/dL   Calcium 8.3 (*) 8.4 - 10.5 mg/dL   Total Protein 7.4  6.0 - 8.3 g/dL   Albumin 2.1 (*) 3.5 - 5.2 g/dL   AST 25  0 - 37 U/L   ALT 8  0 - 53 U/L   Alkaline Phosphatase 78  39 - 117 U/L   Total Bilirubin 3.0 (*) 0.3 - 1.2 mg/dL   GFR calc non Af Amer >90  >90 mL/min   GFR calc Af Amer >90  >90 mL/min   Comment: (NOTE)     The eGFR has been calculated using the CKD EPI equation.     This calculation has not been validated in all clinical situations.     eGFR's persistently <90 mL/min signify possible Chronic Kidney     Disease.   Anion gap 11  5 - 15    Mr Lumbar Spine W  Wo Contrast  01/26/2014   CLINICAL DATA:  History of spinal infection. On antibiotics. Increasing back pain and right leg weakness. HIV, cirrhosis  EXAM: MRI LUMBAR SPINE WITHOUT AND WITH CONTRAST  TECHNIQUE: Multiplanar and multiecho pulse sequences of the lumbar spine were obtained without and with intravenous contrast.  CONTRAST:  2m MULTIHANCE GADOBENATE DIMEGLUMINE 529 MG/ML IV SOLN  COMPARISON:  Lumbar MRI 12/10/2013  FINDINGS: Image quality degraded by technical factors and significant motion.  Discitis and osteomyelitis at L3-4 as noted on prior studies. Diffuse bone marrow edema is present similar to the prior MRI. There is progressive loss of disc space at this level due to infection. No epidural abscess at L3-4. No fracture.  New area of edema in the disc space at L5-S1 with mild adjacent bone marrow edema. Interval development of epidural abscess just below the L5-S1 level to the left of midline. This was not present previously and shows rim enhancement and central fluid signal intensity. No significant nerve root compression although the abscess may be touching the left S1 nerve root.  Negative for acute fracture. Conus medullaris not optimally seen on this study.  IMPRESSION: Disc space infection L3-4 progressive disc space narrowing but no epidural abscess at L3-4.  Interval development of epidural abscess to left of midline just below the L5-S1 disc space. Presumably the L5-S1 disc space is also infected at this time. The abscess was not present on prior studies.  These results were called by telephone at the time of interpretation on 01/26/2014 at 9:27 pm to Dr. DDeno Etienne, who verbally acknowledged these results.   Electronically Signed   By: CFranchot GalloM.D.   On: 01/26/2014 21:29    Review of Systems  Constitutional: Negative.   HENT: Negative.   Eyes: Negative.   Respiratory: Negative.   Cardiovascular: Negative.   Gastrointestinal: Negative.   Genitourinary: Negative.    Musculoskeletal: Positive for back pain, joint pain and myalgias.  Skin: Negative.   Psychiatric/Behavioral: Negative.  Blood pressure 114/57, pulse 93, temperature 99.2 F (37.3 C), temperature source Oral, resp. rate 16, SpO2 96.00%. Physical Exam  Constitutional: He is oriented to person, place, and time. He appears well-developed and well-nourished.  HENT:  Head: Normocephalic.  Eyes: Pupils are equal, round, and reactive to light.  Neck: Normal range of motion.  Cardiovascular: Normal rate and regular rhythm.   Respiratory: Effort normal.  GI: Soft.  Neurological: He is alert and oriented to person, place, and time. He has normal strength. GCS eye subscore is 4. GCS verbal subscore is 5. GCS motor subscore is 6.  Reflex Scores:      Patellar reflexes are 0 on the right side and 0 on the left side.      Achilles reflexes are 0 on the right side and 0 on the left side. Strength is 5 out of 5 in his iliopsoas, quads, and she's, gastrocs and into tibialis, EHL. Bilaterally sensation appears be grossly intact to light touch appropriate session with no evidence of sagittal or perineal anesthesia. Reflex is decreased in his lower extremities bilaterally    Assessment/Plan: 38 year old presents with a discitis new onset spreaders discussed L5-S1 with a small component of epidural abscess displacing the left S1 nerve root. Currently the patient is complaining of back and right leg pain not left leg pain. His neurologic exam shows no evidence of weakness and a side from a couple episodes of bowel incontinence he has no clinical evidence of a cauda equina syndrome. His bladder is working fine he has no saddle perineal anesthesia. Currently the patient's medical status appears fairly compromised with neutropenia and fairly low CD4 counts. Recommend medical admission consults infectious disease assess the patient's systemic disease stability and give determination on patient's a surgical  candidacy. At this point he does not in my opinion have a cauda equina syndrome is no focal weakness his pain is on the contralateral side from the small epidural abscess diagnosed an MRI scan in that I still feel this is a medical management however certainly may need a different course or changing of his antibiotics. If infectious disease feels that we need to culture this fluid and they feel that he is stable for surgery and we could consider doing a laminotomy to aspirating culture the abscess component. I am reluctant to do this at this stage as this seems contralateral to the source of his pain. I would recommend repeat blood cultures. I will await infectious disease evaluation interpretation recommendations.  Samuel Schultz 01/26/2014, 10:36 PM

## 2014-01-26 NOTE — Telephone Encounter (Signed)
Dr. Tommy Medal, please see the following patient email and advise.  Thank you! ______________ DR Edmon Crape I feel like the day I came in the hospital , I have very little use of my right , at times I cant even use at all. My left is extremely in when touched and I can barley use it.o The back pain is back, at times its 30-45 minuets to get help or receive, I cant controls my bowles and I am confined to my bed I have not seen Dr. Doy Mince since July. Only the PA Arlo comes I need your help Edmond Ginsberg

## 2014-01-26 NOTE — ED Notes (Signed)
Pt refusing to be hooked up to monitor

## 2014-01-26 NOTE — ED Provider Notes (Signed)
This patient was seen in conjunction with the resident physician.  The documentation accurately reflects the patient's evaluation.  On my exam, the patient was quite uncomfortable.  He was moving all extremities spontaneously, but with his increasing pain, and prior discitis, as well as notable co morbidities, there was immediate concern for epidural abscess.    Patient had substantial reduction in pain here. Labs demonstrated neutropenia. MRI did show epidural abscess.   After discussion with neurosurgery, ID, critical care the patient was admitted to a step-down unit.    Carmin Muskrat, MD 01/26/14 [phone]

## 2014-01-27 ENCOUNTER — Encounter (HOSPITAL_COMMUNITY): Payer: Self-pay | Admitting: Internal Medicine

## 2014-01-27 DIAGNOSIS — G061 Intraspinal abscess and granuloma: Secondary | ICD-10-CM | POA: Diagnosis present

## 2014-01-27 DIAGNOSIS — K7460 Unspecified cirrhosis of liver: Secondary | ICD-10-CM | POA: Diagnosis present

## 2014-01-27 DIAGNOSIS — B20 Human immunodeficiency virus [HIV] disease: Secondary | ICD-10-CM | POA: Diagnosis present

## 2014-01-27 DIAGNOSIS — D61818 Other pancytopenia: Secondary | ICD-10-CM | POA: Diagnosis present

## 2014-01-27 DIAGNOSIS — F4310 Post-traumatic stress disorder, unspecified: Secondary | ICD-10-CM | POA: Diagnosis present

## 2014-01-27 DIAGNOSIS — G062 Extradural and subdural abscess, unspecified: Secondary | ICD-10-CM

## 2014-01-27 DIAGNOSIS — C8589 Other specified types of non-Hodgkin lymphoma, extranodal and solid organ sites: Secondary | ICD-10-CM | POA: Diagnosis present

## 2014-01-27 DIAGNOSIS — Z9221 Personal history of antineoplastic chemotherapy: Secondary | ICD-10-CM | POA: Diagnosis not present

## 2014-01-27 DIAGNOSIS — F3289 Other specified depressive episodes: Secondary | ICD-10-CM | POA: Diagnosis present

## 2014-01-27 DIAGNOSIS — D684 Acquired coagulation factor deficiency: Secondary | ICD-10-CM | POA: Diagnosis present

## 2014-01-27 DIAGNOSIS — Z79899 Other long term (current) drug therapy: Secondary | ICD-10-CM | POA: Diagnosis not present

## 2014-01-27 DIAGNOSIS — M545 Low back pain, unspecified: Secondary | ICD-10-CM | POA: Diagnosis present

## 2014-01-27 DIAGNOSIS — F329 Major depressive disorder, single episode, unspecified: Secondary | ICD-10-CM | POA: Diagnosis present

## 2014-01-27 DIAGNOSIS — M519 Unspecified thoracic, thoracolumbar and lumbosacral intervertebral disc disorder: Secondary | ICD-10-CM | POA: Diagnosis present

## 2014-01-27 LAB — COMPREHENSIVE METABOLIC PANEL
ALT: 7 U/L (ref 0–53)
AST: 24 U/L (ref 0–37)
Albumin: 1.9 g/dL — ABNORMAL LOW (ref 3.5–5.2)
Alkaline Phosphatase: 68 U/L (ref 39–117)
Anion gap: 8 (ref 5–15)
BUN: 8 mg/dL (ref 6–23)
CALCIUM: 8.3 mg/dL — AB (ref 8.4–10.5)
CO2: 22 mEq/L (ref 19–32)
Chloride: 105 mEq/L (ref 96–112)
Creatinine, Ser: 0.52 mg/dL (ref 0.50–1.35)
GFR calc Af Amer: >90 mL/min (ref >90)
GFR calc non Af Amer: >90 mL/min (ref >90)
Glucose, Bld: 98 mg/dL (ref 70–99)
Potassium: 3.9 mEq/L (ref 3.7–5.3)
SODIUM: 135 meq/L — AB (ref 137–147)
TOTAL PROTEIN: 6.6 g/dL (ref 6.0–8.3)
Total Bilirubin: 2.3 mg/dL — ABNORMAL HIGH (ref 0.3–1.2)

## 2014-01-27 LAB — CBC WITH DIFFERENTIAL/PLATELET
BASOS PCT: 1 % (ref 0–1)
Basophils Absolute: 0 10*3/uL (ref 0.0–0.1)
EOS PCT: 3 % (ref 0–5)
Eosinophils Absolute: 0 10*3/uL (ref 0.0–0.7)
HCT: 19.9 % — ABNORMAL LOW (ref 39.0–52.0)
HEMOGLOBIN: 6.9 g/dL — AB (ref 13.0–17.0)
LYMPHS ABS: 0.4 10*3/uL — AB (ref 0.7–4.0)
Lymphocytes Relative: 32 % (ref 12–46)
MCH: 35.8 pg — AB (ref 26.0–34.0)
MCHC: 34.7 g/dL (ref 30.0–36.0)
MCV: 103.1 fL — AB (ref 78.0–100.0)
MONO ABS: 0.1 10*3/uL (ref 0.1–1.0)
MONOS PCT: 10 % (ref 3–12)
NEUTROS PCT: 54 % (ref 43–77)
Neutro Abs: 0.8 10*3/uL — ABNORMAL LOW (ref 1.7–7.7)
Platelets: 83 10*3/uL — ABNORMAL LOW (ref 150–400)
RBC: 1.93 MIL/uL — AB (ref 4.22–5.81)
RDW: 15.5 % (ref 11.5–15.5)
WBC: 1.3 10*3/uL — CL (ref 4.0–10.5)

## 2014-01-27 LAB — CBC
HCT: 21.6 % — ABNORMAL LOW (ref 39.0–52.0)
Hemoglobin: 7.4 g/dL — ABNORMAL LOW (ref 13.0–17.0)
MCH: 33.9 pg (ref 26.0–34.0)
MCHC: 34.3 g/dL (ref 30.0–36.0)
MCV: 99.1 fL (ref 78.0–100.0)
PLATELETS: 76 10*3/uL — AB (ref 150–400)
RBC: 2.18 MIL/uL — ABNORMAL LOW (ref 4.22–5.81)
RDW: 17.7 % — AB (ref 11.5–15.5)
WBC: 2 10*3/uL — ABNORMAL LOW (ref 4.0–10.5)

## 2014-01-27 LAB — RETICULOCYTES
RBC.: 1.89 MIL/uL — ABNORMAL LOW (ref 4.22–5.81)
RETIC CT PCT: 4.1 % — AB (ref 0.4–3.1)
Retic Count, Absolute: 77.5 10*3/uL (ref 19.0–186.0)

## 2014-01-27 LAB — FOLATE: FOLATE: 11.4 ng/mL

## 2014-01-27 LAB — IRON AND TIBC
Iron: 37 ug/dL — ABNORMAL LOW (ref 42–135)
Saturation Ratios: 36 % (ref 20–55)
TIBC: 104 ug/dL — AB (ref 215–435)
UIBC: 67 ug/dL — ABNORMAL LOW (ref 125–400)

## 2014-01-27 LAB — VITAMIN B12: Vitamin B-12: 1155 pg/mL — ABNORMAL HIGH (ref 211–911)

## 2014-01-27 LAB — PREPARE RBC (CROSSMATCH)

## 2014-01-27 LAB — CK: CK TOTAL: 56 U/L (ref 7–232)

## 2014-01-27 LAB — FERRITIN: Ferritin: 354 ng/mL — ABNORMAL HIGH (ref 22–322)

## 2014-01-27 MED ORDER — AZITHROMYCIN 600 MG PO TABS
1200.0000 mg | ORAL_TABLET | ORAL | Status: DC
  Administered 2014-01-27 – 2014-02-03 (×2): 1200 mg via ORAL
  Filled 2014-01-27 (×2): qty 2

## 2014-01-27 MED ORDER — MORPHINE SULFATE ER 15 MG PO TBCR
30.0000 mg | EXTENDED_RELEASE_TABLET | Freq: Two times a day (BID) | ORAL | Status: DC
  Administered 2014-01-27 – 2014-01-31 (×10): 30 mg via ORAL
  Filled 2014-01-27 (×11): qty 2

## 2014-01-27 MED ORDER — MORPHINE SULFATE 15 MG PO TABS
15.0000 mg | ORAL_TABLET | ORAL | Status: DC | PRN
  Filled 2014-01-27: qty 1

## 2014-01-27 MED ORDER — POTASSIUM CHLORIDE CRYS ER 20 MEQ PO TBCR
20.0000 meq | EXTENDED_RELEASE_TABLET | Freq: Every day | ORAL | Status: DC
  Administered 2014-01-27 – 2014-02-05 (×9): 20 meq via ORAL
  Filled 2014-01-27 (×16): qty 1

## 2014-01-27 MED ORDER — ADULT MULTIVITAMIN W/MINERALS CH
1.0000 | ORAL_TABLET | Freq: Every day | ORAL | Status: DC
  Administered 2014-01-28 – 2014-02-05 (×8): 1 via ORAL
  Filled 2014-01-27 (×10): qty 1

## 2014-01-27 MED ORDER — CLONAZEPAM 0.5 MG PO TABS
0.5000 mg | ORAL_TABLET | Freq: Two times a day (BID) | ORAL | Status: DC | PRN
  Administered 2014-01-27 – 2014-02-01 (×4): 0.5 mg via ORAL
  Filled 2014-01-27 (×4): qty 1

## 2014-01-27 MED ORDER — HYDROMORPHONE HCL PF 1 MG/ML IJ SOLN
1.0000 mg | INTRAMUSCULAR | Status: DC | PRN
  Administered 2014-01-27 – 2014-01-30 (×10): 1 mg via INTRAVENOUS
  Filled 2014-01-27 (×11): qty 1

## 2014-01-27 MED ORDER — ONDANSETRON HCL 4 MG PO TABS
4.0000 mg | ORAL_TABLET | Freq: Four times a day (QID) | ORAL | Status: DC | PRN
  Administered 2014-02-04: 4 mg via ORAL
  Filled 2014-01-27: qty 1

## 2014-01-27 MED ORDER — BUPROPION HCL ER (XL) 300 MG PO TB24
300.0000 mg | ORAL_TABLET | Freq: Every day | ORAL | Status: DC
  Administered 2014-01-27 – 2014-02-05 (×9): 300 mg via ORAL
  Filled 2014-01-27 (×10): qty 1

## 2014-01-27 MED ORDER — EMTRICITABINE-TENOFOVIR DF 200-300 MG PO TABS
1.0000 | ORAL_TABLET | Freq: Every day | ORAL | Status: DC
  Administered 2014-01-27 – 2014-02-04 (×9): 1 via ORAL
  Filled 2014-01-27 (×10): qty 1

## 2014-01-27 MED ORDER — PANTOPRAZOLE SODIUM 40 MG PO TBEC
40.0000 mg | DELAYED_RELEASE_TABLET | Freq: Every day | ORAL | Status: DC
  Administered 2014-01-27 – 2014-02-05 (×9): 40 mg via ORAL
  Filled 2014-01-27 (×9): qty 1

## 2014-01-27 MED ORDER — ATOVAQUONE 750 MG/5ML PO SUSP
750.0000 mg | Freq: Every day | ORAL | Status: DC
  Administered 2014-01-27: 750 mg via ORAL
  Filled 2014-01-27 (×10): qty 5

## 2014-01-27 MED ORDER — DEXTROSE 5 % IV SOLN
2.0000 g | Freq: Three times a day (TID) | INTRAVENOUS | Status: DC
  Administered 2014-01-27 – 2014-02-01 (×16): 2 g via INTRAVENOUS
  Filled 2014-01-27 (×20): qty 2

## 2014-01-27 MED ORDER — METHOCARBAMOL 500 MG PO TABS
500.0000 mg | ORAL_TABLET | Freq: Four times a day (QID) | ORAL | Status: DC | PRN
  Administered 2014-01-30 – 2014-02-03 (×3): 500 mg via ORAL
  Filled 2014-01-27 (×4): qty 1

## 2014-01-27 MED ORDER — ACETAMINOPHEN 650 MG RE SUPP: 650.0000 mg | Freq: Four times a day (QID) | RECTAL | Status: DC | PRN

## 2014-01-27 MED ORDER — DARUNAVIR ETHANOLATE 800 MG PO TABS
800.0000 mg | ORAL_TABLET | Freq: Every day | ORAL | Status: DC
  Administered 2014-01-27 – 2014-02-05 (×9): 800 mg via ORAL
  Filled 2014-01-27 (×11): qty 1

## 2014-01-27 MED ORDER — GABAPENTIN 400 MG PO CAPS
400.0000 mg | ORAL_CAPSULE | Freq: Three times a day (TID) | ORAL | Status: DC
  Administered 2014-01-27 – 2014-02-05 (×28): 400 mg via ORAL
  Filled 2014-01-27 (×30): qty 1

## 2014-01-27 MED ORDER — RITONAVIR 100 MG PO CAPS
100.0000 mg | ORAL_CAPSULE | Freq: Every day | ORAL | Status: DC
  Administered 2014-01-27 – 2014-02-05 (×9): 100 mg via ORAL
  Filled 2014-01-27 (×11): qty 1

## 2014-01-27 MED ORDER — VITAMIN K (PHYTONADIONE) 100 MCG PO TABS: 100.0000 ug | ORAL_TABLET | Freq: Every day | ORAL | Status: DC

## 2014-01-27 MED ORDER — ONDANSETRON HCL 4 MG/2ML IJ SOLN
4.0000 mg | Freq: Four times a day (QID) | INTRAMUSCULAR | Status: DC | PRN
  Administered 2014-01-30 – 2014-02-05 (×3): 4 mg via INTRAVENOUS
  Filled 2014-01-27 (×3): qty 2

## 2014-01-27 MED ORDER — PROMETHAZINE HCL 12.5 MG PO TABS: 12.5000 mg | ORAL_TABLET | Freq: Four times a day (QID) | ORAL | Status: DC | PRN

## 2014-01-27 MED ORDER — DEXTROSE 5 % IV SOLN: 1.0000 g | Freq: Three times a day (TID) | INTRAVENOUS | Status: DC

## 2014-01-27 MED ORDER — TEMAZEPAM 7.5 MG PO CAPS: 7.5000 mg | ORAL_CAPSULE | Freq: Every evening | ORAL | Status: DC | PRN

## 2014-01-27 MED ORDER — MORPHINE SULFATE 15 MG PO TABS
15.0000 mg | ORAL_TABLET | ORAL | Status: DC | PRN
  Administered 2014-01-27 – 2014-02-05 (×13): 15 mg via ORAL
  Filled 2014-01-27 (×14): qty 1

## 2014-01-27 MED ORDER — SUCRALFATE 1 G PO TABS
1.0000 g | ORAL_TABLET | Freq: Three times a day (TID) | ORAL | Status: DC
  Administered 2014-01-27 – 2014-02-05 (×25): 1 g via ORAL
  Filled 2014-01-27 (×32): qty 1

## 2014-01-27 MED ORDER — FLUCONAZOLE IN SODIUM CHLORIDE 400-0.9 MG/200ML-% IV SOLN
400.0000 mg | INTRAVENOUS | Status: DC
  Administered 2014-01-27 – 2014-02-01 (×6): 400 mg via INTRAVENOUS
  Filled 2014-01-27 (×6): qty 200

## 2014-01-27 MED ORDER — SODIUM CHLORIDE 0.9 % IJ SOLN
3.0000 mL | Freq: Two times a day (BID) | INTRAMUSCULAR | Status: DC
  Administered 2014-02-01: 3 mL via INTRAVENOUS

## 2014-01-27 MED ORDER — CYCLOBENZAPRINE HCL 5 MG PO TABS
5.0000 mg | ORAL_TABLET | Freq: Every day | ORAL | Status: DC
  Administered 2014-01-27 – 2014-02-04 (×10): 5 mg via ORAL
  Filled 2014-01-27 (×11): qty 1

## 2014-01-27 MED ORDER — SODIUM CHLORIDE 0.9 % IV SOLN: Freq: Once | INTRAVENOUS | Status: DC

## 2014-01-27 MED ORDER — ACETAMINOPHEN 325 MG PO TABS: 650.0000 mg | ORAL_TABLET | Freq: Four times a day (QID) | ORAL | Status: DC | PRN

## 2014-01-27 NOTE — Progress Notes (Signed)
PROGRESS NOTE    Samuel Schultz UEA:[id number] DOB: 25-Oct-1975 DOA: 01/26/2014 PCP: [name], MD/infectious disease M.D. in [place], [place] Oncology: [facility].  HPI/Brief narrative 38 year old male hospitalized in June with polymicrobial bacteremia felt to be related to colitis. His blood cultures were positive for vancomycin and ampicillin sensitive enterococcus, vancomycin resistant enterococcus, and Candida albicans. He received a little over 2 weeks of therapy with daptomycin and fluconazole, completing therapy in late June. He was readmitted on July 5 with severe, acute back pain and was found to have L3-4 diskitis & osteomyelitis. He had lumbar aspirations on July 6 and July 10. Gram stain and culture were negative on both specimens. He was started on daptomycin and ceftazidime on July 10 and discharged to a skilled nursing facility. He presented with worsening back pain. MRI shows possible L5-S1 discitis with epidural abscess. Neurosurgery does not see acute surgical need. Infectious disease consulted.   Assessment/Plan:  1. 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 Infectious disease consultation appreciated. Continue IV daptomycin and Ceftazidime. IV fluconazole added. 2. Pancytopenia: DD-lymphoma, HIV and meds versus other etiology. Check anemia panel. Transfuse one unit PRBC for hemoglobin 6.9. Follow posttransfusion CBCs. No reported bleeding. 3. History of lymphoma: Apparently treated with chemotherapy in 2008 and missed for followup appointment at [facility] in 2014. Check peripheral smear. Will need outpatient followup with oncology. May not be a candidate for any chemotherapy at this time was ongoing acute infection. 4. HIV/AIDS: Continue retroviral therapy. ID input appreciated. 5. History of non-alcoholic cirrhosis with coagulopathy: Patient on vitamin K replacement.   Code Status: Full Family Communication:  None at bedside Disposition Plan: SNF when medically stable.   Consultants:  Infectious disease  Neurosurgery  Procedures:  None  Antibiotics:  IV daptomycin 8/22 >  IV ceftazidime 8/22 >  IV fluconazole/22 >   Subjective: Continues to complain of lower back pain radiating to right lower extremity. Denies bleeding.  Objective: Filed Vitals:   01/27/14 0030 01/27/14 0151 01/27/14 0616 01/27/14 1401  BP: 117/48 110/58 107/54 98/52  Pulse: 99 100 90 89  Temp:  100.3 F (37.9 C) 99.8 F (37.7 C) 99.2 F (37.3 C)  TempSrc:  Oral Oral Oral  Resp:  16 16 18   Height:  5\' 9"  (1.753 m)    Weight:  82.7 kg (182 lb 5.1 oz) 82.7 kg (182 lb 5.1 oz)   SpO2: 95% 96% 96% 96%    Intake/Output Summary (Last 24 hours) at 01/27/14 1428 Last data filed at 01/27/14 1403  Gross per 24 hour  Intake  462.5 ml  Output    500 ml  Net  -37.5 ml   Filed Weights   01/27/14 0151 01/27/14 0616  Weight: 82.7 kg (182 lb 5.1 oz) 82.7 kg (182 lb 5.1 oz)     Exam:  General exam: Pleasant young male lying in bed with mild painful distress Respiratory system: Clear. No increased work of breathing. Cardiovascular system: S1 & S2 heard, RRR. No JVD, murmurs, gallops, clicks or pedal edema. Gastrointestinal system: Abdomen is nondistended, soft and nontender. Normal bowel sounds heard. Central nervous system: Alert and oriented. No focal neurological deficits. Extremities: Symmetric 5 x 5 power. Limited evaluation of right lower extremity secondary to pain.   Data Reviewed: Basic Metabolic Panel:  Recent Labs Lab 01/26/14 1648 01/27/14 0647  NA 133* 135*  K 3.9 3.9  CL 101 105  CO2 21 22  GLUCOSE 91 98  BUN 7 8  CREATININE 0.55 0.52  CALCIUM 8.3* 8.3*   Liver Function Tests:  Recent Labs Lab 01/26/14 1648 01/27/14 0647  AST 25 24  ALT 8 7  ALKPHOS 78 68  BILITOT 3.0* 2.3*  PROT 7.4 6.6  ALBUMIN 2.1* 1.9*   No results found for this basename: LIPASE, AMYLASE,  in the  last 168 hours No results found for this basename: AMMONIA,  in the last 168 hours CBC:  Recent Labs Lab 01/26/14 1648 01/27/14 0647  WBC 2.3* 1.3*  NEUTROABS 1.4* 0.8*  HGB 7.8* 6.9*  HCT 22.9* 19.9*  MCV 103.6* 103.1*  PLT 94* 83*   Cardiac Enzymes:  Recent Labs Lab 01/27/14 0647  CKTOTAL 56   BNP (last 3 results) No results found for this basename: PROBNP,  in the last 8760 hours CBG: No results found for this basename: GLUCAP,  in the last 168 hours  Recent Results (from the past 240 hour(s))  CULTURE, BLOOD (ROUTINE X 2)     Status: None   Collection Time    01/26/14  4:49 PM      Result Value Ref Range Status   Specimen Description BLOOD RIGHT PICC LINE   Final   Special Requests BOTTLES DRAWN AEROBIC AND ANAEROBIC 10CC   Final   Culture  Setup Time     Final   Value: 01/26/2014 23:10     Performed at [facility]   Culture     Final   Value:        BLOOD CULTURE RECEIVED NO GROWTH TO DATE CULTURE WILL BE HELD FOR 5 DAYS BEFORE ISSUING A FINAL NEGATIVE REPORT     Performed at [facility]   Report Status PENDING   Incomplete  CULTURE, BLOOD (ROUTINE X 2)     Status: None   Collection Time    01/26/14  5:00 PM      Result Value Ref Range Status   Specimen Description BLOOD ARM LEFT   Final   Special Requests BOTTLES DRAWN AEROBIC AND ANAEROBIC 10CC   Final   Culture  Setup Time     Final   Value: 01/26/2014 23:08     Performed at [facility]   Culture     Final   Value:        BLOOD CULTURE RECEIVED NO GROWTH TO DATE CULTURE WILL BE HELD FOR 5 DAYS BEFORE ISSUING A FINAL NEGATIVE REPORT     Performed at [facility]   Report Status PENDING   Incomplete       Studies: Mr Lumbar Spine W Wo Contrast  01/26/2014   CLINICAL DATA:  History of spinal infection. On antibiotics. Increasing back pain and right leg weakness. HIV, cirrhosis  EXAM: MRI LUMBAR SPINE WITHOUT AND WITH CONTRAST  TECHNIQUE: Multiplanar and multiecho  pulse sequences of the lumbar spine were obtained without and with intravenous contrast.  CONTRAST:  85mL MULTIHANCE GADOBENATE DIMEGLUMINE 529 MG/ML IV SOLN  COMPARISON:  Lumbar MRI 12/10/2013  FINDINGS: Image quality degraded by technical factors and significant motion.  Discitis and osteomyelitis at L3-4 as noted on prior studies. Diffuse bone marrow edema is present similar to the prior MRI. There is progressive loss of disc space at this level due to infection. No epidural abscess at L3-4. No fracture.  New area of edema in the disc space at L5-S1 with mild adjacent bone marrow edema. Interval development of epidural abscess just below the  L5-S1 level to the left of midline. This was not present previously and shows rim enhancement and central fluid signal intensity. No significant nerve root compression although the abscess may be touching the left S1 nerve root.  Negative for acute fracture. Conus medullaris not optimally seen on this study.  IMPRESSION: Disc space infection L3-4 progressive disc space narrowing but no epidural abscess at L3-4.  Interval development of epidural abscess to left of midline just below the L5-S1 disc space. Presumably the L5-S1 disc space is also infected at this time. The abscess was not present on prior studies.  These results were called by telephone at the time of interpretation on 01/26/2014 at 9:27 pm to Dr. Deno Etienne , who verbally acknowledged these results.   Electronically Signed   By: Franchot Gallo M.D.   On: 01/26/2014 21:29        Scheduled Meds: . sodium chloride   Intravenous Once  . atovaquone  750 mg Oral Daily  . azithromycin  1,200 mg Oral Weekly  . buPROPion  300 mg Oral Daily  . cefTAZidime (FORTAZ)  IV  2 g Intravenous 3 times per day  . cyclobenzaprine  5 mg Oral QHS  . DAPTOmycin (CUBICIN)  IV  700 mg Intravenous Q24H  . Darunavir Ethanolate  800 mg Oral Q breakfast  . emtricitabine-tenofovir  1 tablet Oral QHS  . fluconazole (DIFLUCAN) IV   400 mg Intravenous Q24H  . gabapentin  400 mg Oral TID  . morphine  30 mg Oral Q12H  . multivitamin with minerals  1 tablet Oral Daily  . pantoprazole  40 mg Oral Daily  . potassium chloride SA  20 mEq Oral Daily  . ritonavir  100 mg Oral Q breakfast  . sodium chloride  3 mL Intravenous Q12H  . sucralfate  1 g Oral TID WC   Continuous Infusions:   Principal Problem:   Abscess in epidural space of lumbar spine Active Problems:   AIDS   Large cell lymphoma   Cirrhosis, non-alcoholic   Pancytopenia   Epidural abscess    Time spent: 30 minutes.    Vernell Leep, MD, FACP, FHM. [facility] Pager [phone]  If 7PM-7AM, please contact night-coverage [email or web address] Password [email or web address] 01/27/2014, 2:28 PM    LOS: 1 day

## 2014-01-27 NOTE — Progress Notes (Addendum)
Patient ID: Samuel Schultz, male   DOB: Feb 02, 1976, 38 y.o.   MRN: [id number] Patient appears more caudal as morning but also appears possibly oversedated on pain medication  Neurologically nonfocal strength5 out of 5 and voiding well  Await ID evaluation recommendations for antibiotics and staging of systemic disease

## 2014-01-27 NOTE — Progress Notes (Signed)
Patient ID: Samuel Schultz, male   DOB: 1976/03/13, 38 y.o.   MRN: [id number]         [facility]    Date of Admission:  01/26/2014   Total days of antibiotics 43         Principal Problem:   Abscess in epidural space of lumbar Schultz Active Problems:   AIDS   Large cell lymphoma   Cirrhosis, non-alcoholic   Pancytopenia   Epidural abscess   . sodium chloride   Intravenous Once  . atovaquone  750 mg Oral Daily  . azithromycin  1,200 mg Oral Weekly  . buPROPion  300 mg Oral Daily  . cefTAZidime (FORTAZ)  IV  2 g Intravenous 3 times per day  . cyclobenzaprine  5 mg Oral QHS  . DAPTOmycin (CUBICIN)  IV  700 mg Intravenous Q24H  . Darunavir Ethanolate  800 mg Oral Q breakfast  . emtricitabine-tenofovir  1 tablet Oral QHS  . gabapentin  400 mg Oral TID  . morphine  30 mg Oral Q12H  . multivitamin with minerals  1 tablet Oral Daily  . pantoprazole  40 mg Oral Daily  . potassium chloride SA  20 mEq Oral Daily  . ritonavir  100 mg Oral Q breakfast  . sodium chloride  3 mL Intravenous Q12H  . sucralfate  1 g Oral TID WC    Past Medical History  Diagnosis Date  . HIV disease   . Cirrhosis   . PTSD (post-traumatic stress disorder)   . Depressive disorder   . Cancer   . Lymphoma   . Anemia     History  Substance Use Topics  . Smoking status: Never Smoker   . Smokeless tobacco: Never Used  . Alcohol Use: No    Family History  Problem Relation Age of Onset  . Cancer Maternal Grandmother     breast ca  . Cancer Maternal Grandfather     colon ca    Allergies  Allergen Reactions  . Codeine Anaphylaxis, Hives, Nausea And Vomiting, Nausea Only and Swelling  . Oxycodone Other (See Comments)    Other reaction(s): Dizziness Lightheaded, nausea    Objective: Temp:  [99.8 F (37.7 C)-100.3 F (37.9 C)] 99.8 F (37.7 C) (08/22 0616) Pulse Rate:  [90-100] 90 (08/22 0616) Resp:  [15-19] 16 (08/22 0616) BP: (100-117)/(44-66) 107/54 mmHg  (08/22 0616) SpO2:  [92 %-100 %] 96 % (08/22 0616) Weight:  [182 lb 5.1 oz (82.7 kg)] 182 lb 5.1 oz (82.7 kg) (08/22 0616)  General: He is extremely groggy and unable to stay awake to answer questions Skin: No rash Lungs: Clear Cor: Regular S1 and S2 with no murmur Abdomen: Soft Joints and extremities: He is moving all extremities spontaneously  Lab Results Lab Results  Component Value Date   WBC 1.3* 01/27/2014   HGB 6.9* 01/27/2014   HCT 19.9* 01/27/2014   MCV 103.1* 01/27/2014   PLT 83* 01/27/2014    Lab Results  Component Value Date   CREATININE 0.52 01/27/2014   BUN 8 01/27/2014   NA 135* 01/27/2014   K 3.9 01/27/2014   CL 105 01/27/2014   CO2 22 01/27/2014    Lab Results  Component Value Date   ALT 7 01/27/2014   AST 24 01/27/2014   ALKPHOS 68 01/27/2014   BILITOT 2.3* 01/27/2014    HIV 1 RNA Quant (copies/mL)  Date Value  11/11/2013 178*     CD4 T Cell Abs (/uL)  Date Value  10/18/2013 20*   Microbiology: Recent Results (from the past 240 hour(s))  CULTURE, BLOOD (ROUTINE X 2)     Status: None   Collection Time    01/26/14  4:49 PM      Result Value Ref Range Status   Specimen Description BLOOD RIGHT PICC LINE   Final   Special Requests BOTTLES DRAWN AEROBIC AND ANAEROBIC 10CC   Final   Culture  Setup Time     Final   Value: 01/26/2014 23:10     Performed at [facility]   Culture     Final   Value:        BLOOD CULTURE RECEIVED NO GROWTH TO DATE CULTURE WILL BE HELD FOR 5 DAYS BEFORE ISSUING A FINAL NEGATIVE REPORT     Performed at [facility]   Report Status PENDING   Incomplete  CULTURE, BLOOD (ROUTINE X 2)     Status: None   Collection Time    01/26/14  5:00 PM      Result Value Ref Range Status   Specimen Description BLOOD ARM LEFT   Final   Special Requests BOTTLES DRAWN AEROBIC AND ANAEROBIC 10CC   Final   Culture  Setup Time     Final   Value: 01/26/2014 23:08     Performed at [facility]   Culture     Final   Value:         BLOOD CULTURE RECEIVED NO GROWTH TO DATE CULTURE WILL BE HELD FOR 5 DAYS BEFORE ISSUING A FINAL NEGATIVE REPORT     Performed at [facility]   Report Status PENDING   Incomplete    Studies/Results: Samuel Schultz W Wo Contrast  01/26/2014   CLINICAL DATA:  History of spinal infection. On antibiotics. Increasing back pain and right leg weakness. HIV, cirrhosis  EXAM: MRI LUMBAR Schultz WITHOUT AND WITH CONTRAST  TECHNIQUE: Multiplanar and multiecho pulse sequences of the lumbar Schultz were obtained without and with intravenous contrast.  CONTRAST:  51mL MULTIHANCE GADOBENATE DIMEGLUMINE 529 MG/ML IV SOLN  COMPARISON:  Lumbar MRI 12/10/2013  FINDINGS: Image quality degraded by technical factors and significant motion.  Discitis and osteomyelitis at L3-4 as noted on prior studies. Diffuse bone marrow edema is present similar to the prior MRI. There is progressive loss of disc space at this level due to infection. No epidural abscess at L3-4. No fracture.  New area of edema in the disc space at L5-S1 with mild adjacent bone marrow edema. Interval development of epidural abscess just below the L5-S1 level to the left of midline. This was not present previously and shows rim enhancement and central fluid signal intensity. No significant nerve root compression although the abscess may be touching the left S1 nerve root.  Negative for acute fracture. Conus medullaris not optimally seen on this study.  IMPRESSION: Disc space infection L3-4 progressive disc space narrowing but no epidural abscess at L3-4.  Interval development of epidural abscess to left of midline just below the L5-S1 disc space. Presumably the L5-S1 disc space is also infected at this time. The abscess was not present on prior studies.  These results were called by telephone at the time of interpretation on 01/26/2014 at 9:27 pm to Dr. Deno Schultz , who verbally acknowledged these results.   Electronically Signed   By: Samuel Schultz M.D.   On:  01/26/2014 21:29    Assessment: Samuel. Pelfrey was hospitalized in June with polymicrobial  bacteremia felt to be related to colitis. His blood cultures were positive for vancomycin and ampicillin sensitive enterococcus, vancomycin resistant enterococcus, and Candida albicans. He received a little over 2 weeks of therapy with daptomycin and fluconazole, completing therapy in late June. He was readmitted on July 5 with severe, acute back pain and was found to have L3-4 disc it is an osteomyelitis. He had lumbar aspirations on July 6 and July 10. Gram stain and culture were negative on both specimens. He was started on daptomycin and ceftazidime on July 10 and discharged to a skilled nursing facility. He called my partner, Dr. Tommy Medal, yesterday stating that his pain was getting worse. He was readmitted and the MRI shows some narrowing of the L3-4 disc space a new involvement of L5-S1 with a small epidural abscess. The abscess cannot safely be aspirated without seeing the spinal canal. His admission note indicates that his outpatient antibiotics were daptomycin and ceftriaxone. I will check with [facility] to see if he was on ceftriaxone or ceftazidime. I will have high dose fluconazole just in case he seated Schultz with Candida during his episode of fungemia in June.  Plan: 1. Continue daptomycin and ceftazidime for now 2. Start IV fluconazole 3. Call [facility] 4. Continue current antiretroviral regimen 5. Repeat CD4 and viral load  Michel Bickers, MD [facility] [place] [phone] pager   [phone] cell 01/27/2014, 1:42 PM

## 2014-01-27 NOTE — H&P (Signed)
[facility] History and Physical  Jakye Mullens HYW:[id number] DOB: Oct 11, 1975 DOA: 01/26/2014  Referring physician: ER physician. PCP: [name], MD  Chief Complaint: Low back pain.  HPI: Knowledge Samuel Schultz is a 38 y.o. male with history of AIDS, lymphoma, nonalcoholic cirrhosis, coagulopathy, pancytopenia presented to the ER because of worsening back pain. Patient states that he has been having pain in his low back last few days it has been radiating to his right leg. Denies any fever chills. Patient was recently admitted for discitis and is on IV daptomycin and ceftriaxone. MRI L-spine shows epidural abscess L5-S1 area. On-call neurosurgeon Dr. Saintclair Halsted was consulted and Dr. Saintclair Halsted at this time feels patient does not have any cauda equina features. And neurosurgeon has recommended infectious disease input and ED physician had discussed with on-call infectious disease consultant who at this time is recommended to continue present antibiotics and they will be seeing patient in consult. Blood cultures were obtained and patient has been admitted for further management. On exam patient has significant pain on moving his right lower extremity. He is able to move his left lower extremity without difficulty. Denies any nausea vomiting abdominal pain diarrhea chest pain or shortness of breath.    Review of Systems: As presented in the history of presenting illness, rest negative.  Past Medical History  Diagnosis Date  . HIV disease   . Cirrhosis   . PTSD (post-traumatic stress disorder)   . Depressive disorder   . Cancer   . Lymphoma   . Anemia    Past Surgical History  Procedure Laterality Date  . Cholecystectomy    . Appendectomy    . Tee without cardioversion N/A 11/13/2013    Procedure: TRANSESOPHAGEAL ECHOCARDIOGRAM (TEE);  Surgeon: Dorothy Spark, MD;  Location: [facility];  Service: Cardiovascular;  Laterality: N/A;  . Infection of spine     Social History:  reports that he has  never smoked. He has never used smokeless tobacco. He reports that he does not drink alcohol or use illicit drugs. Where does patient live nursing home. Can patient participate in ADLs? Not sure.  Allergies  Allergen Reactions  . Codeine Anaphylaxis, Hives, Nausea And Vomiting, Nausea Only and Swelling  . Oxycodone Other (See Comments)    Other reaction(s): Dizziness Lightheaded, nausea    Family History:  Family History  Problem Relation Age of Onset  . Cancer Maternal Grandmother     breast ca  . Cancer Maternal Grandfather     colon ca      Prior to Admission medications   Medication Sig Start Date End Date Taking? Authorizing Provider  atovaquone (MEPRON) 750 MG/5ML suspension Take 750 mg by mouth daily.   Yes Historical Provider, MD  azithromycin (ZITHROMAX) 600 MG tablet Take 1,200 mg by mouth once a week. saturday   Yes Historical Provider, MD  buPROPion (WELLBUTRIN XL) 300 MG 24 hr tablet Take 300 mg by mouth daily.   Yes Historical Provider, MD  cefTRIAXone 1 g in dextrose 5 % 50 mL Inject 1 g into the vein every 8 (eight) hours.   Yes Historical Provider, MD  clonazePAM (KLONOPIN) 0.5 MG tablet Take 0.5 mg by mouth 2 (two) times daily as needed for anxiety.   Yes Historical Provider, MD  cyclobenzaprine (FLEXERIL) 5 MG tablet Take 1 tablet (5 mg total) by mouth at bedtime. 12/18/13  Yes Geradine Girt, DO  DAPTOmycin (CUBICIN IV) Inject 700 mg into the vein daily.   Yes Historical Provider, MD  Darunavir Ethanolate (PREZISTA) 800 MG tablet Take 1 tablet (800 mg total) by mouth daily. 01/01/14  Yes Truman Hayward, MD  emtricitabine-tenofovir (TRUVADA) 200-300 MG per tablet Take 1 tablet by mouth at bedtime. 01/01/14  Yes Truman Hayward, MD  gabapentin (NEURONTIN) 400 MG capsule Take 400 mg by mouth 3 (three) times daily.   Yes Historical Provider, MD  methocarbamol (ROBAXIN) 500 MG tablet Take 1 tablet (500 mg total) by mouth every 6 (six) hours as needed for muscle  spasms. 12/19/13  Yes Geradine Girt, DO  morphine (MS CONTIN) 30 MG 12 hr tablet Take 30 mg by mouth every 12 (twelve) hours.   Yes Historical Provider, MD  morphine (MSIR) 15 MG tablet Take 15 mg by mouth every 4 (four) hours as needed for severe pain.   Yes Historical Provider, MD  omeprazole (PRILOSEC) 20 MG capsule Take 20 mg by mouth 2 (two) times daily.   Yes Historical Provider, MD  potassium chloride SA (K-DUR,KLOR-CON) 20 MEQ tablet Take 20 mEq by mouth daily.   Yes Historical Provider, MD  promethazine (PHENERGAN) 12.5 MG tablet Take 12.5 mg by mouth every 6 (six) hours as needed for nausea or vomiting.   Yes Historical Provider, MD  ritonavir (NORVIR) 100 MG capsule Take 1 capsule (100 mg total) by mouth daily with breakfast. With Prezista 01/01/14  Yes Truman Hayward, MD  sucralfate (CARAFATE) 1 G tablet Take 1 g by mouth 3 (three) times daily with meals.    Yes Historical Provider, MD  temazepam (RESTORIL) 7.5 MG capsule Take 7.5 mg by mouth at bedtime as needed for sleep.   Yes Historical Provider, MD  Vitamin K, Phytonadione, 100 MCG TABS Take 100 mcg by mouth daily.   Yes Historical Provider, MD    Physical Exam: Filed Vitals:   01/26/14 2130 01/26/14 2230 01/27/14 0000 01/27/14 0030  BP: 114/57 113/65 100/66 117/48  Pulse: 93 99 98 99  Temp:      TempSrc:      Resp:      SpO2: 96% 93% 95% 95%     General:  Well built and moderately nourished.  Eyes: Anicteric no pallor.  ENT: No discharge from ears eyes nose mouth.  Neck: No mass felt.  Cardiovascular: S1-S2 heard.  Respiratory: No rhonchi or crepitations.  Abdomen: Soft nontender bowel sounds present.  Skin: No rash.  Musculoskeletal: Patient has significant pain on moving his right lower extremity.  Psychiatric: Appears normal.  Neurologic: Alert awake oriented to time place and person. Moves all extremities. Pain on moving right lower extremity.  Labs on Admission:  Basic Metabolic  Panel:  Recent Labs Lab 01/26/14 1648  NA 133*  K 3.9  CL 101  CO2 21  GLUCOSE 91  BUN 7  CREATININE 0.55  CALCIUM 8.3*   Liver Function Tests:  Recent Labs Lab 01/26/14 1648  AST 25  ALT 8  ALKPHOS 78  BILITOT 3.0*  PROT 7.4  ALBUMIN 2.1*   No results found for this basename: LIPASE, AMYLASE,  in the last 168 hours No results found for this basename: AMMONIA,  in the last 168 hours CBC:  Recent Labs Lab 01/26/14 1648  WBC 2.3*  NEUTROABS 1.4*  HGB 7.8*  HCT 22.9*  MCV 103.6*  PLT 94*   Cardiac Enzymes: No results found for this basename: CKTOTAL, CKMB, CKMBINDEX, TROPONINI,  in the last 168 hours  BNP (last 3 results) No results found for this basename:  PROBNP,  in the last 8760 hours CBG: No results found for this basename: GLUCAP,  in the last 168 hours  Radiological Exams on Admission: Mr Lumbar Spine W Wo Contrast  01/26/2014   CLINICAL DATA:  History of spinal infection. On antibiotics. Increasing back pain and right leg weakness. HIV, cirrhosis  EXAM: MRI LUMBAR SPINE WITHOUT AND WITH CONTRAST  TECHNIQUE: Multiplanar and multiecho pulse sequences of the lumbar spine were obtained without and with intravenous contrast.  CONTRAST:  31mL MULTIHANCE GADOBENATE DIMEGLUMINE 529 MG/ML IV SOLN  COMPARISON:  Lumbar MRI 12/10/2013  FINDINGS: Image quality degraded by technical factors and significant motion.  Discitis and osteomyelitis at L3-4 as noted on prior studies. Diffuse bone marrow edema is present similar to the prior MRI. There is progressive loss of disc space at this level due to infection. No epidural abscess at L3-4. No fracture.  New area of edema in the disc space at L5-S1 with mild adjacent bone marrow edema. Interval development of epidural abscess just below the L5-S1 level to the left of midline. This was not present previously and shows rim enhancement and central fluid signal intensity. No significant nerve root compression although the abscess may  be touching the left S1 nerve root.  Negative for acute fracture. Conus medullaris not optimally seen on this study.  IMPRESSION: Disc space infection L3-4 progressive disc space narrowing but no epidural abscess at L3-4.  Interval development of epidural abscess to left of midline just below the L5-S1 disc space. Presumably the L5-S1 disc space is also infected at this time. The abscess was not present on prior studies.  These results were called by telephone at the time of interpretation on 01/26/2014 at 9:27 pm to Dr. Deno Etienne , who verbally acknowledged these results.   Electronically Signed   By: Franchot Gallo M.D.   On: 01/26/2014 21:29     Assessment/Plan Principal Problem:   Abscess in epidural space of lumbar spine Active Problems:   AIDS   Large cell lymphoma   Cirrhosis, non-alcoholic   Pancytopenia   Epidural abscess   1. Epidural abscess L5-S1 - patient was earlier placed on vancomycin and ceftriaxone for his discitis which has been continued for now and infectious disease will be seeing patient in consult. Further recommendations per neurosurgery and infectious disease. Patient is on pain relief medications. Blood cultures were drawn. 2. AIDS - last CD4 count 20. Continue antiretrovirals and prophylactic antibiotics. 3. Pancytopenia - colchicine was recently discontinued. Closely follow CBC with differentials. 4. History of nonalcoholic cirrhosis of the liver with coagulopathy - patient is on vitamin K replacement. 5. History of lymphoma status post chemotherapy in 2008.    Code Status: Full code.  Family Communication: None.  Disposition Plan: Admit to inpatient.    Jibril Mcminn N. [facility] Pager [phone].  If 7PM-7AM, please contact night-coverage [email or web address] Password [email or web address] 01/27/2014, 1:44 AM

## 2014-01-28 ENCOUNTER — Encounter: Payer: Self-pay | Admitting: Internal Medicine

## 2014-01-28 DIAGNOSIS — M4640 Discitis, unspecified, site unspecified: Secondary | ICD-10-CM | POA: Insufficient documentation

## 2014-01-28 LAB — TYPE AND SCREEN
ABO/RH(D): A POS
ANTIBODY SCREEN: NEGATIVE
Unit division: 0

## 2014-01-28 LAB — CBC
HCT: 22.5 % — ABNORMAL LOW (ref 39.0–52.0)
Hemoglobin: 7.6 g/dL — ABNORMAL LOW (ref 13.0–17.0)
MCH: 33.8 pg (ref 26.0–34.0)
MCHC: 33.8 g/dL (ref 30.0–36.0)
MCV: 100 fL (ref 78.0–100.0)
Platelets: 89 10*3/uL — ABNORMAL LOW (ref 150–400)
RBC: 2.25 MIL/uL — ABNORMAL LOW (ref 4.22–5.81)
RDW: 18.5 % — AB (ref 11.5–15.5)
WBC: 2.3 10*3/uL — ABNORMAL LOW (ref 4.0–10.5)

## 2014-01-28 NOTE — Progress Notes (Signed)
PROGRESS NOTE    Samuel Schultz JJH:[id number] DOB: 1976-04-29 DOA: 01/26/2014 PCP: [name], MD/infectious disease M.D. in [place], [place] Oncology: [facility].  HPI/Brief narrative 38 year old male hospitalized in June with polymicrobial bacteremia felt to be related to colitis. His blood cultures were positive for vancomycin and ampicillin sensitive enterococcus, vancomycin resistant enterococcus, and Candida albicans. He received a little over 2 weeks of therapy with daptomycin and fluconazole, completing therapy in late June. He was readmitted on July 5 with severe, acute back pain and was found to have L3-4 diskitis & osteomyelitis. He had lumbar aspirations on July 6 and July 10. Gram stain and culture were negative on both specimens. He was started on daptomycin and ceftazidime on July 10 and discharged to a skilled nursing facility. He presented with worsening back pain. MRI shows possible L5-S1 discitis with epidural abscess. Neurosurgery does not see acute surgical need. Infectious disease consulted.   Assessment/Plan:  1. 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 Infectious disease consultation appreciated. Continue IV daptomycin and Ceftazidime. IV fluconazole added. Back pain better controlled. Blood Cx's negative to date. 2. Pancytopenia: DD-lymphoma, HIV and meds versus other etiology. Anemia panel - ? Chronic disease ? Iron deficiency. S/p  1 unit PRBC on 8/22 for hemoglobin 6.9. Improved to 7.6. No reported bleeding. Follow. Start Iron supplements. WBC and Platelets slightly better. 3. History of lymphoma: Apparently treated with chemotherapy in 2008 and missed for followup appointment at [facility] in 2014. Check peripheral smear- pending. Will need outpatient followup with oncology. May not be a candidate for any chemotherapy at this time was ongoing acute infection. 4. HIV/AIDS: Continue retroviral therapy. ID  input appreciated. 5. History of non-alcoholic cirrhosis with coagulopathy: Patient on vitamin K replacement.   Code Status: Full Family Communication: None at bedside Disposition Plan: SNF when medically stable.   Consultants:  Infectious disease  Neurosurgery  Procedures:  None  Antibiotics:  IV daptomycin 8/22 >  IV ceftazidime 8/22 >  IV fluconazole/22 >   Subjective: Back pain better controlled with meds.  Objective: Filed Vitals:   01/27/14 1435 01/27/14 2100 01/28/14 0539 01/28/14 1411  BP: 107/79 108/52 107/57 115/49  Pulse: 92 87 92 89  Temp: 99.3 F (37.4 C) 99.4 F (37.4 C) 99.2 F (37.3 C) 99.2 F (37.3 C)  TempSrc: Oral   Oral  Resp: 18 16 15 18   Height:      Weight:   82.736 kg (182 lb 6.4 oz)   SpO2: 98% 98% 95% 97%    Intake/Output Summary (Last 24 hours) at 01/28/14 1524 Last data filed at 01/28/14 1410  Gross per 24 hour  Intake    240 ml  Output    700 ml  Net   -460 ml   Filed Weights   01/27/14 0151 01/27/14 0616 01/28/14 0539  Weight: 82.7 kg (182 lb 5.1 oz) 82.7 kg (182 lb 5.1 oz) 82.736 kg (182 lb 6.4 oz)     Exam:  General exam: Pleasant young male lying in bed without distress. Appears much comfortable today. Respiratory system: Clear. No increased work of breathing. Cardiovascular system: S1 & S2 heard, RRR. No JVD, murmurs, gallops, clicks or pedal edema. Gastrointestinal system: Abdomen is nondistended, soft and nontender. Normal bowel sounds heard. Central nervous system: Alert and oriented. No focal neurological deficits. Extremities: Symmetric 5 x 5 power. Limited evaluation of right lower extremity secondary to pain.   Data  Reviewed: Basic Metabolic Panel:  Recent Labs Lab 01/26/14 1648 01/27/14 0647  NA 133* 135*  K 3.9 3.9  CL 101 105  CO2 21 22  GLUCOSE 91 98  BUN 7 8  CREATININE 0.55 0.52  CALCIUM 8.3* 8.3*   Liver Function Tests:  Recent Labs Lab 01/26/14 1648 01/27/14 0647  AST 25 24    ALT 8 7  ALKPHOS 78 68  BILITOT 3.0* 2.3*  PROT 7.4 6.6  ALBUMIN 2.1* 1.9*   No results found for this basename: LIPASE, AMYLASE,  in the last 168 hours No results found for this basename: AMMONIA,  in the last 168 hours CBC:  Recent Labs Lab 01/26/14 1648 01/27/14 0647 01/27/14 1835 01/28/14 0430  WBC 2.3* 1.3* 2.0* 2.3*  NEUTROABS 1.4* 0.8*  --   --   HGB 7.8* 6.9* 7.4* 7.6*  HCT 22.9* 19.9* 21.6* 22.5*  MCV 103.6* 103.1* 99.1 100.0  PLT 94* 83* 76* 89*   Cardiac Enzymes:  Recent Labs Lab 01/27/14 0647  CKTOTAL 56   BNP (last 3 results) No results found for this basename: PROBNP,  in the last 8760 hours CBG: No results found for this basename: GLUCAP,  in the last 168 hours  Recent Results (from the past 240 hour(s))  CULTURE, BLOOD (ROUTINE X 2)     Status: None   Collection Time    01/26/14  4:49 PM      Result Value Ref Range Status   Specimen Description BLOOD RIGHT PICC LINE   Final   Special Requests BOTTLES DRAWN AEROBIC AND ANAEROBIC 10CC   Final   Culture  Setup Time     Final   Value: 01/26/2014 23:10     Performed at [facility]   Culture     Final   Value:        BLOOD CULTURE RECEIVED NO GROWTH TO DATE CULTURE WILL BE HELD FOR 5 DAYS BEFORE ISSUING A FINAL NEGATIVE REPORT     Performed at [facility]   Report Status PENDING   Incomplete  CULTURE, BLOOD (ROUTINE X 2)     Status: None   Collection Time    01/26/14  5:00 PM      Result Value Ref Range Status   Specimen Description BLOOD ARM LEFT   Final   Special Requests BOTTLES DRAWN AEROBIC AND ANAEROBIC 10CC   Final   Culture  Setup Time     Final   Value: 01/26/2014 23:08     Performed at [facility]   Culture     Final   Value:        BLOOD CULTURE RECEIVED NO GROWTH TO DATE CULTURE WILL BE HELD FOR 5 DAYS BEFORE ISSUING A FINAL NEGATIVE REPORT     Performed at [facility]   Report Status PENDING   Incomplete       Studies: Mr Lumbar Spine W Wo  Contrast  01/26/2014   CLINICAL DATA:  History of spinal infection. On antibiotics. Increasing back pain and right leg weakness. HIV, cirrhosis  EXAM: MRI LUMBAR SPINE WITHOUT AND WITH CONTRAST  TECHNIQUE: Multiplanar and multiecho pulse sequences of the lumbar spine were obtained without and with intravenous contrast.  CONTRAST:  41mL MULTIHANCE GADOBENATE DIMEGLUMINE 529 MG/ML IV SOLN  COMPARISON:  Lumbar MRI 12/10/2013  FINDINGS: Image quality degraded by technical factors and significant motion.  Discitis and osteomyelitis at L3-4 as noted on prior studies. Diffuse bone marrow edema is present  similar to the prior MRI. There is progressive loss of disc space at this level due to infection. No epidural abscess at L3-4. No fracture.  New area of edema in the disc space at L5-S1 with mild adjacent bone marrow edema. Interval development of epidural abscess just below the L5-S1 level to the left of midline. This was not present previously and shows rim enhancement and central fluid signal intensity. No significant nerve root compression although the abscess may be touching the left S1 nerve root.  Negative for acute fracture. Conus medullaris not optimally seen on this study.  IMPRESSION: Disc space infection L3-4 progressive disc space narrowing but no epidural abscess at L3-4.  Interval development of epidural abscess to left of midline just below the L5-S1 disc space. Presumably the L5-S1 disc space is also infected at this time. The abscess was not present on prior studies.  These results were called by telephone at the time of interpretation on 01/26/2014 at 9:27 pm to Dr. Deno Etienne , who verbally acknowledged these results.   Electronically Signed   By: Franchot Gallo M.D.   On: 01/26/2014 21:29        Scheduled Meds: . sodium chloride   Intravenous Once  . atovaquone  750 mg Oral Daily  . azithromycin  1,200 mg Oral Weekly  . buPROPion  300 mg Oral Daily  . cefTAZidime (FORTAZ)  IV  2 g  Intravenous 3 times per day  . cyclobenzaprine  5 mg Oral QHS  . DAPTOmycin (CUBICIN)  IV  700 mg Intravenous Q24H  . Darunavir Ethanolate  800 mg Oral Q breakfast  . emtricitabine-tenofovir  1 tablet Oral QHS  . fluconazole (DIFLUCAN) IV  400 mg Intravenous Q24H  . gabapentin  400 mg Oral TID  . morphine  30 mg Oral Q12H  . multivitamin with minerals  1 tablet Oral Daily  . pantoprazole  40 mg Oral Daily  . potassium chloride SA  20 mEq Oral Daily  . ritonavir  100 mg Oral Q breakfast  . sodium chloride  3 mL Intravenous Q12H  . sucralfate  1 g Oral TID WC   Continuous Infusions:   Principal Problem:   Abscess in epidural space of lumbar spine Active Problems:   AIDS   Large cell lymphoma   Cirrhosis, non-alcoholic   Pancytopenia   Epidural abscess    Time spent: 30 minutes.    Vernell Leep, MD, FACP, FHM. [facility] Pager [phone]  If 7PM-7AM, please contact night-coverage [email or web address] Password [email or web address] 01/28/2014, 3:24 PM    LOS: 2 days

## 2014-01-28 NOTE — Progress Notes (Signed)
Patient ID: Samuel Schultz, male   DOB: 1976-02-07, 38 y.o.   MRN: [id number]         [facility]    Date of Admission:  01/26/2014   Total days of antibiotics 44         Principal Problem:   Abscess in epidural space of lumbar spine Active Problems:   AIDS   Large cell lymphoma   Cirrhosis, non-alcoholic   Pancytopenia   Epidural abscess   . sodium chloride   Intravenous Once  . atovaquone  750 mg Oral Daily  . azithromycin  1,200 mg Oral Weekly  . buPROPion  300 mg Oral Daily  . cefTAZidime (FORTAZ)  IV  2 g Intravenous 3 times per day  . cyclobenzaprine  5 mg Oral QHS  . DAPTOmycin (CUBICIN)  IV  700 mg Intravenous Q24H  . Darunavir Ethanolate  800 mg Oral Q breakfast  . emtricitabine-tenofovir  1 tablet Oral QHS  . fluconazole (DIFLUCAN) IV  400 mg Intravenous Q24H  . gabapentin  400 mg Oral TID  . morphine  30 mg Oral Q12H  . multivitamin with minerals  1 tablet Oral Daily  . pantoprazole  40 mg Oral Daily  . potassium chloride SA  20 mEq Oral Daily  . ritonavir  100 mg Oral Q breakfast  . sodium chloride  3 mL Intravenous Q12H  . sucralfate  1 g Oral TID WC    Past Medical History  Diagnosis Date  . HIV disease   . Cirrhosis   . PTSD (post-traumatic stress disorder)   . Depressive disorder   . Cancer   . Lymphoma   . Anemia     History  Substance Use Topics  . Smoking status: Never Smoker   . Smokeless tobacco: Never Used  . Alcohol Use: No    Family History  Problem Relation Age of Onset  . Cancer Maternal Grandmother     breast ca  . Cancer Maternal Grandfather     colon ca    Allergies  Allergen Reactions  . Codeine Anaphylaxis, Hives, Nausea And Vomiting, Nausea Only and Swelling  . Oxycodone Other (See Comments)    Other reaction(s): Dizziness Lightheaded, nausea   Subjective: He is a little more conversant today but still very sleepy. He says there has been no change in his pain.  Objective: Temp:  [98.2  F (36.8 C)-99.4 F (37.4 C)] 98.2 F (36.8 C) (08/23 0923) Pulse Rate:  [87-92] 90 (08/23 0923) Resp:  [15-20] 20 (08/23 0923) BP: (98-108)/(52-79) 102/52 mmHg (08/23 0923) SpO2:  [95 %-98 %] 95 % (08/23 0539) Weight:  [182 lb 6.4 oz (82.736 kg)] 182 lb 6.4 oz (82.736 kg) (08/23 0539)  General: He is still very extremely groggy  Skin: No rash Lungs: Clear Cor: Regular S1 and S2 with no murmur Abdomen: Soft Joints and extremities: He is moving all extremities spontaneously  Lab Results Lab Results  Component Value Date   WBC 2.3* 01/28/2014   HGB 7.6* 01/28/2014   HCT 22.5* 01/28/2014   MCV 100.0 01/28/2014   PLT 89* 01/28/2014    Lab Results  Component Value Date   CREATININE 0.52 01/27/2014   BUN 8 01/27/2014   NA 135* 01/27/2014   K 3.9 01/27/2014   CL 105 01/27/2014   CO2 22 01/27/2014    Lab Results  Component Value Date   ALT 7 01/27/2014   AST 24 01/27/2014   ALKPHOS 68 01/27/2014  BILITOT 2.3* 01/27/2014    HIV 1 RNA Quant (copies/mL)  Date Value  11/11/2013 178*     CD4 T Cell Abs (/uL)  Date Value  10/18/2013 20*   Microbiology: Recent Results (from the past 240 hour(s))  CULTURE, BLOOD (ROUTINE X 2)     Status: None   Collection Time    01/26/14  4:49 PM      Result Value Ref Range Status   Specimen Description BLOOD RIGHT PICC LINE   Final   Special Requests BOTTLES DRAWN AEROBIC AND ANAEROBIC 10CC   Final   Culture  Setup Time     Final   Value: 01/26/2014 23:10     Performed at [facility]   Culture     Final   Value:        BLOOD CULTURE RECEIVED NO GROWTH TO DATE CULTURE WILL BE HELD FOR 5 DAYS BEFORE ISSUING A FINAL NEGATIVE REPORT     Performed at [facility]   Report Status PENDING   Incomplete  CULTURE, BLOOD (ROUTINE X 2)     Status: None   Collection Time    01/26/14  5:00 PM      Result Value Ref Range Status   Specimen Description BLOOD ARM LEFT   Final   Special Requests BOTTLES DRAWN AEROBIC AND ANAEROBIC 10CC   Final     Culture  Setup Time     Final   Value: 01/26/2014 23:08     Performed at [facility]   Culture     Final   Value:        BLOOD CULTURE RECEIVED NO GROWTH TO DATE CULTURE WILL BE HELD FOR 5 DAYS BEFORE ISSUING A FINAL NEGATIVE REPORT     Performed at [facility]   Report Status PENDING   Incomplete    Studies/Results: Mr Lumbar Spine W Wo Contrast  01/26/2014   CLINICAL DATA:  History of spinal infection. On antibiotics. Increasing back pain and right leg weakness. HIV, cirrhosis  EXAM: MRI LUMBAR SPINE WITHOUT AND WITH CONTRAST  TECHNIQUE: Multiplanar and multiecho pulse sequences of the lumbar spine were obtained without and with intravenous contrast.  CONTRAST:  89mL MULTIHANCE GADOBENATE DIMEGLUMINE 529 MG/ML IV SOLN  COMPARISON:  Lumbar MRI 12/10/2013  FINDINGS: Image quality degraded by technical factors and significant motion.  Discitis and osteomyelitis at L3-4 as noted on prior studies. Diffuse bone marrow edema is present similar to the prior MRI. There is progressive loss of disc space at this level due to infection. No epidural abscess at L3-4. No fracture.  New area of edema in the disc space at L5-S1 with mild adjacent bone marrow edema. Interval development of epidural abscess just below the L5-S1 level to the left of midline. This was not present previously and shows rim enhancement and central fluid signal intensity. No significant nerve root compression although the abscess may be touching the left S1 nerve root.  Negative for acute fracture. Conus medullaris not optimally seen on this study.  IMPRESSION: Disc space infection L3-4 progressive disc space narrowing but no epidural abscess at L3-4.  Interval development of epidural abscess to left of midline just below the L5-S1 disc space. Presumably the L5-S1 disc space is also infected at this time. The abscess was not present on prior studies.  These results were called by telephone at the time of interpretation on  01/26/2014 at 9:27 pm to Dr. Deno Etienne , who verbally acknowledged these results.  Electronically Signed   By: Franchot Gallo M.D.   On: 01/26/2014 21:29    Assessment: I am not sure why his lumbar infection is worsening. I did not get an answer at [facility] to help determine if he has been on IV ceftazidime or IV ceftriaxone recently.  Plan: 1. Continue daptomycin, ceftazidime and fluconazole for now 2. Continue current antiretroviral regimen 3. Repeat CD4 and viral load  Michel Bickers, MD [facility] for Infectious [place] [phone] pager   [phone] cell 01/28/2014, 12:54 PM

## 2014-01-28 NOTE — Progress Notes (Signed)
Patient ID: Samuel Schultz, male   DOB: [birth year], 38 y.o.   MRN: [id number] Neurologically unchanged seems more comfortable today however still complains of severe back and leg pain on the right  Continue to follow with ID surgery only if ID needs additional specimen aspirated

## 2014-01-28 NOTE — Evaluation (Signed)
Physical Therapy Evaluation Patient Details Name: Samuel Schultz MRN: [id number] DOB: [birth year] Today's Date: 01/28/2014   History of Present Illness  Pt is a 38 y.o. male with history of HIV/AIDS, lymphoma, nonalcoholic cirrhosis, coagulopathy, pancytopenia presented to the ER because of worsening back pain. Patient states that he has been having pain in his low back last few days it has been radiating to his right leg. Denies any fever chills. Patient was recently admitted for discitis and is on IV daptomycin and ceftriaxone. MRI L-spine shows epidural abscess L5-S1 area. On-call neurosurgeon Dr. Saintclair Schultz was consulted and Dr. Saintclair Schultz at this time feels patient does not have any cauda equina features.  Clinical Impression  Pt admitted with the above. Pt currently with functional limitations due to the deficits listed below (see PT Problem List). At the time of PT eval pt was severely limited by pain. RN present to provide pain medication prior to session and pt declining, after mobility, asking for pain medication again. Discussed benefits of premedicating when working with therapy to prevent sudden spike in pain levels. Pt will benefit from skilled PT to increase their independence and safety with mobility to allow discharge to the venue listed below.       Follow Up Recommendations SNF;Supervision/Assistance - 24 hour    Equipment Recommendations  None recommended by PT    Recommendations for Other Services       Precautions / Restrictions Precautions Precautions: Fall Restrictions Weight Bearing Restrictions: No      Mobility  Bed Mobility Overal bed mobility: Needs Assistance Bed Mobility: Supine to Sit     Supine to sit: Min assist     General bed mobility comments: Pt moving very slowly and guarded. VC's for technique to minimize back pain however pt made no corrective changes. Assist for elevation of trunk to full sitting position.   Transfers Overall transfer level: Needs  assistance Equipment used: 2 person hand held assist Transfers: Sit to/from [facility] Sit to Stand: Mod assist;+2 physical assistance;+2 safety/equipment Stand pivot transfers: Min assist;+2 physical assistance;+2 safety/equipment       General transfer comment: VC's for hand placement on seated surface for safety. Pt required bilateral assist at the trunk and hips for balance. Feel pt could have done better if he had UE assist. Refusing to use therapist and tech for support with his UE's and wanted to hold his hip instead due to pain. Max curing for sequencing when transferring around to the recliner.   Ambulation/Gait             General Gait Details: Deferred due to pain  Stairs            Wheelchair Mobility    Modified Rankin (Stroke Patients Only)       Balance Overall balance assessment: Needs assistance Sitting-balance support: Feet supported;Bilateral upper extremity supported Sitting balance-Leahy Scale: Poor Sitting balance - Comments: Pt requires use of UE's for support at this time.    Standing balance support: Bilateral upper extremity supported Standing balance-Leahy Scale: Poor Standing balance comment: +2 assist for balance.                             Pertinent Vitals/Pain Pain Assessment: Faces Faces Pain Scale: Hurts worst Pain Location: Pt holding R hip during transfer to recliner Pain Intervention(s): RN gave pain meds during session (RN attempted to give pain meds prior to session. Pt refusing)    Home  Living Family/patient expects to be discharged to:: Skilled nursing facility                 Additional Comments: pt was using RW for short distance amb, staff was setting pt up for bathing    Prior Function Level of Independence: Needs assistance   Gait / Transfers Assistance Needed: used RW  ADL's / Homemaking Assistance Needed: assist for bathing        Hand Dominance   Dominant Hand:  Right    Extremity/Trunk Assessment   Upper Extremity Assessment: Defer to OT evaluation           Lower Extremity Assessment: RLE deficits/detail RLE Deficits / Details: Acute pain associated with abscess on L5-S1. Decreased strength and AROM as a result    Cervical / Trunk Assessment: Normal  Communication   Communication: No difficulties  Cognition Arousal/Alertness: Awake/alert Behavior During Therapy: Anxious Overall Cognitive Status: Within Functional Limits for tasks assessed                      General Comments      Exercises        Assessment/Plan    PT Assessment Patient needs continued PT services  PT Diagnosis Acute pain;Difficulty walking   PT Problem List Decreased strength;Decreased range of motion;Decreased activity tolerance;Decreased balance;Decreased mobility;Decreased knowledge of use of DME;Decreased safety awareness;Decreased knowledge of precautions;Pain  PT Treatment Interventions DME instruction;Gait training;Functional mobility training;Therapeutic activities;Therapeutic exercise;Neuromuscular re-education;Patient/family education;Wheelchair mobility training   PT Goals (Current goals can be found in the Care Plan section) Acute Rehab PT Goals Patient Stated Goal: None stated during session PT Goal Formulation: With patient Time For Goal Achievement: 02/11/14 Potential to Achieve Goals: Fair    Frequency Min 2X/week   Barriers to discharge        Co-evaluation               End of Session Equipment Utilized During Treatment: Gait belt Activity Tolerance: Patient limited by pain Patient left: in chair;with call bell/phone within reach Nurse Communication: Mobility status         Time: 0902-0926 PT Time Calculation (min): 24 min   Charges:   PT Evaluation $Initial PT Evaluation Tier I: 1 Procedure PT Treatments $Therapeutic Activity: 23-37 mins   PT G CodesJolyn Schultz 01/28/2014, 11:54  AM  Samuel Schultz, PT, DPT Acute Rehabilitation Services Pager: [phone]

## 2014-01-29 ENCOUNTER — Other Ambulatory Visit: Payer: Self-pay | Admitting: Radiology

## 2014-01-29 DIAGNOSIS — M519 Unspecified thoracic, thoracolumbar and lumbosacral intervertebral disc disorder: Secondary | ICD-10-CM

## 2014-01-29 DIAGNOSIS — Z21 Asymptomatic human immunodeficiency virus [HIV] infection status: Secondary | ICD-10-CM

## 2014-01-29 DIAGNOSIS — M4646 Discitis, unspecified, lumbar region: Secondary | ICD-10-CM

## 2014-01-29 LAB — CBC
HCT: 22.6 % — ABNORMAL LOW (ref 39.0–52.0)
Hemoglobin: 7.6 g/dL — ABNORMAL LOW (ref 13.0–17.0)
MCH: 34.4 pg — ABNORMAL HIGH (ref 26.0–34.0)
MCHC: 33.6 g/dL (ref 30.0–36.0)
MCV: 102.3 fL — ABNORMAL HIGH (ref 78.0–100.0)
PLATELETS: 82 10*3/uL — AB (ref 150–400)
RBC: 2.21 MIL/uL — AB (ref 4.22–5.81)
RDW: 18.5 % — ABNORMAL HIGH (ref 11.5–15.5)
WBC: 2 10*3/uL — AB (ref 4.0–10.5)

## 2014-01-29 LAB — PATHOLOGIST SMEAR REVIEW

## 2014-01-29 LAB — HIV-1 RNA QUANT-NO REFLEX-BLD: HIV-1 RNA Quant, Log: <1.3 {Log} (ref <1.30)

## 2014-01-29 LAB — T-HELPER CELLS (CD4) COUNT (NOT AT ARMC)
CD4 T CELL HELPER: 6 % — AB (ref 33–55)
CD4 T Cell Abs: 40 /uL — ABNORMAL LOW (ref 400–2700)

## 2014-01-29 MED ORDER — DOCUSATE SODIUM 100 MG PO CAPS
100.0000 mg | ORAL_CAPSULE | Freq: Two times a day (BID) | ORAL | Status: DC
  Administered 2014-01-29 – 2014-02-05 (×14): 100 mg via ORAL
  Filled 2014-01-29 (×16): qty 1

## 2014-01-29 MED ORDER — FERROUS SULFATE 325 (65 FE) MG PO TABS
325.0000 mg | ORAL_TABLET | Freq: Two times a day (BID) | ORAL | Status: DC
  Administered 2014-01-29 – 2014-02-05 (×14): 325 mg via ORAL
  Filled 2014-01-29 (×19): qty 1

## 2014-01-29 MED ORDER — SODIUM CHLORIDE 0.9 % IJ SOLN
10.0000 mL | INTRAMUSCULAR | Status: DC | PRN
  Administered 2014-02-01 – 2014-02-05 (×3): 10 mL

## 2014-01-29 NOTE — Progress Notes (Addendum)
PROGRESS NOTE    Diante Barley OZD:[id number] DOB: May 22, 1976 DOA: 01/26/2014 PCP: [name], MD/infectious disease M.D. in [place], [place] Oncology: [facility].  HPI/Brief narrative 38 year old male hospitalized in June with polymicrobial bacteremia felt to be related to colitis. His blood cultures were positive for vancomycin and ampicillin sensitive enterococcus, vancomycin resistant enterococcus, and Candida albicans. He received a little over 2 weeks of therapy with daptomycin and fluconazole, completing therapy in late June. He was readmitted on July 5 with severe, acute back pain and was found to have L3-4 diskitis & osteomyelitis. He had lumbar aspirations on July 6 and July 10. Gram stain and culture were negative on both specimens. He was started on daptomycin and ceftazidime on July 10 and discharged to a skilled nursing facility. He presented with worsening back pain. MRI shows possible L5-S1 discitis with epidural abscess. Neurosurgery does not see acute surgical need. Infectious disease consulted.   Assessment/Plan:  1. 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 Infectious disease consultation appreciated. Continue IV Daptomycin, Ceftazidime & IV fluconazole added. Back pain better controlled. Blood Cx's negative to date. Since not responding to broad spectrum Abx, on 8/24 ID recommends aspiration by Neuro surgery for Cx (Bacterial, AFB & Fungal Cx) to appropriately Rx. Paged Dr.Cram. 2. Pancytopenia: DD-lymphoma, HIV and meds versus other etiology. Anemia panel - ? Chronic disease ? Iron deficiency. S/p  1 unit PRBC on 8/22 for hemoglobin 6.9. Improved to 7.6. No reported bleeding. Follow. Started Iron supplements. WBC and Platelets slightly better. 3. History of lymphoma: Apparently treated with chemotherapy in 2008 and missed for followup appointment at [facility] in 2014. Check peripheral smear- pending. Will need  outpatient followup with oncology. May not be a candidate for any chemotherapy at this time was ongoing acute infection. 4. HIV/AIDS: Continue retroviral therapy. ID input appreciated. 5. History of non-alcoholic cirrhosis with coagulopathy: Patient on vitamin K replacement.   Code Status: Full Family Communication: None at bedside Disposition Plan: SNF when medically stable.   Consultants:  Infectious disease  Neurosurgery  Procedures:  None  Antibiotics:  IV daptomycin 8/22 >  IV ceftazidime 8/22 >  IV fluconazole/22 >   Subjective: Back pain better controlled with meds. No new complaints.  Objective: Filed Vitals:   01/28/14 0539 01/28/14 1411 01/28/14 2050 01/29/14 0515  BP: 107/57 115/49 104/55 116/65  Pulse: 92 89 88 86  Temp: 99.2 F (37.3 C) 99.2 F (37.3 C) 99.1 F (37.3 C) 99.6 F (37.6 C)  TempSrc:  Oral    Resp: 15 18 15 16   Height:      Weight: 82.736 kg (182 lb 6.4 oz)     SpO2: 95% 97% 97% 99%    Intake/Output Summary (Last 24 hours) at 01/29/14 1247 Last data filed at 01/29/14 0500  Gross per 24 hour  Intake      0 ml  Output    600 ml  Net   -600 ml   Filed Weights   01/27/14 0151 01/27/14 0616 01/28/14 0539  Weight: 82.7 kg (182 lb 5.1 oz) 82.7 kg (182 lb 5.1 oz) 82.736 kg (182 lb 6.4 oz)     Exam:  General exam: Pleasant young male lying in bed without distress. Respiratory system: Clear. No increased work of breathing. Cardiovascular system: S1 & S2 heard, RRR. No JVD, murmurs, gallops, clicks or pedal edema. Gastrointestinal system: Abdomen is nondistended, soft and nontender. Normal bowel sounds heard.  Central nervous system: Alert and oriented. No focal neurological deficits. Extremities: Symmetric 5 x 5 power. Limited evaluation of right lower extremity secondary to pain.   Data Reviewed: Basic Metabolic Panel:  Recent Labs Lab 01/26/14 1648 01/27/14 0647  NA 133* 135*  K 3.9 3.9  CL 101 105  CO2 21 22  GLUCOSE  91 98  BUN 7 8  CREATININE 0.55 0.52  CALCIUM 8.3* 8.3*   Liver Function Tests:  Recent Labs Lab 01/26/14 1648 01/27/14 0647  AST 25 24  ALT 8 7  ALKPHOS 78 68  BILITOT 3.0* 2.3*  PROT 7.4 6.6  ALBUMIN 2.1* 1.9*   No results found for this basename: LIPASE, AMYLASE,  in the last 168 hours No results found for this basename: AMMONIA,  in the last 168 hours CBC:  Recent Labs Lab 01/26/14 1648 01/27/14 0647 01/27/14 1835 01/28/14 0430 01/29/14 0400  WBC 2.3* 1.3* 2.0* 2.3* 2.0*  NEUTROABS 1.4* 0.8*  --   --   --   HGB 7.8* 6.9* 7.4* 7.6* 7.6*  HCT 22.9* 19.9* 21.6* 22.5* 22.6*  MCV 103.6* 103.1* 99.1 100.0 102.3*  PLT 94* 83* 76* 89* 82*   Cardiac Enzymes:  Recent Labs Lab 01/27/14 0647  CKTOTAL 56   BNP (last 3 results) No results found for this basename: PROBNP,  in the last 8760 hours CBG: No results found for this basename: GLUCAP,  in the last 168 hours  Recent Results (from the past 240 hour(s))  CULTURE, BLOOD (ROUTINE X 2)     Status: None   Collection Time    01/26/14  4:49 PM      Result Value Ref Range Status   Specimen Description BLOOD RIGHT PICC LINE   Final   Special Requests BOTTLES DRAWN AEROBIC AND ANAEROBIC 10CC   Final   Culture  Setup Time     Final   Value: 01/26/2014 23:10     Performed at [facility]   Culture     Final   Value:        BLOOD CULTURE RECEIVED NO GROWTH TO DATE CULTURE WILL BE HELD FOR 5 DAYS BEFORE ISSUING A FINAL NEGATIVE REPORT     Performed at [facility]   Report Status PENDING   Incomplete  CULTURE, BLOOD (ROUTINE X 2)     Status: None   Collection Time    01/26/14  5:00 PM      Result Value Ref Range Status   Specimen Description BLOOD ARM LEFT   Final   Special Requests BOTTLES DRAWN AEROBIC AND ANAEROBIC 10CC   Final   Culture  Setup Time     Final   Value: 01/26/2014 23:08     Performed at [facility]   Culture     Final   Value:        BLOOD CULTURE RECEIVED NO GROWTH TO  DATE CULTURE WILL BE HELD FOR 5 DAYS BEFORE ISSUING A FINAL NEGATIVE REPORT     Performed at [facility]   Report Status PENDING   Incomplete       Studies: No results found.      Scheduled Meds: . sodium chloride   Intravenous Once  . atovaquone  750 mg Oral Daily  . azithromycin  1,200 mg Oral Weekly  . buPROPion  300 mg Oral Daily  . cefTAZidime (FORTAZ)  IV  2 g Intravenous 3 times per day  . cyclobenzaprine  5 mg Oral QHS  .  DAPTOmycin (CUBICIN)  IV  700 mg Intravenous Q24H  . Darunavir Ethanolate  800 mg Oral Q breakfast  . docusate sodium  100 mg Oral BID  . emtricitabine-tenofovir  1 tablet Oral QHS  . ferrous sulfate  325 mg Oral BID WC  . fluconazole (DIFLUCAN) IV  400 mg Intravenous Q24H  . gabapentin  400 mg Oral TID  . morphine  30 mg Oral Q12H  . multivitamin with minerals  1 tablet Oral Daily  . pantoprazole  40 mg Oral Daily  . potassium chloride SA  20 mEq Oral Daily  . ritonavir  100 mg Oral Q breakfast  . sodium chloride  3 mL Intravenous Q12H  . sucralfate  1 g Oral TID WC   Continuous Infusions:   Principal Problem:   Abscess in epidural space of lumbar spine Active Problems:   AIDS   Large cell lymphoma   Cirrhosis, non-alcoholic   Pancytopenia   Epidural abscess    Time spent: 25 minutes.    Vernell Leep, MD, FACP, FHM. [facility] Pager [phone]  If 7PM-7AM, please contact night-coverage [email or web address] Password [facility] 01/29/2014, 12:47 PM    LOS: 3 days

## 2014-01-29 NOTE — Progress Notes (Signed)
Clinical Social Work Department BRIEF PSYCHOSOCIAL ASSESSMENT 01/29/2014  Patient:  Samuel Schultz, Samuel Schultz     Account Number:  [id number]     Admit date:  01/26/2014  Clinical Social Worker:  Adair Laundry  Date/Time:  01/29/2014 01:00 PM  Referred by:  Physician  Date Referred:  01/29/2014 Referred for  SNF Placement   Other Referral:   Interview type:  Patient Other interview type:   Spoke with pt at bedside and pt friend over the phone    PSYCHOSOCIAL DATA Living Status:  FACILITY Admitted from facility:  [facility] Level of care:  [place] Primary support name:  Karma Lew Primary support relationship to patient:  FRIEND Degree of support available:   Pt has good support from friends    CURRENT CONCERNS Current Concerns  Post-Acute Placement   Other Concerns:    [place] / PLAN CSW received call from Karma Lew pt friend informing CSW that pt is not wanting to return to [facility] and would like a facility in [place] closer to his support system. CSW informed Ms. Mel Almond that [facility] would visit pt and discuss and update her if pt was agreeable to [place] speaking with her. CSW spoke with pt and pt confirmed that CSW can speak with Ms. Mel Almond and provide with any update. Pt informed CSW he is very unhappy at current facility and does not believe it is a good fit for him. Pt wanting to go to a facility in [place], [place] to be closer to his friends. Pt preference for [facility] facility but also agreeable to [facility]. CSW did notify pt that because of payor source it may be difficult to find placement for pt on short notice and that pt may need to return to current facility and have Ms. Mel Almond continue to work on transitioning to new facility. Pt upset with this but understanding. CSW did call [facility] facility and spoke with Hilda Blades. Hilda Blades to review clinicals and call CSW back.   Assessment/plan status:  Psychosocial Support/Ongoing Assessment  of Needs Other assessment/ plan:   Information/referral to community resources:   None needed at this time    PATIENT'S/FAMILY'S RESPONSE TO PLAN OF CARE: Pt upset during conversation and expressed that he is unhappy at current facility. Pt agreeable to SNF but hoping to dc to different facility.       [place], [address]

## 2014-01-29 NOTE — Progress Notes (Addendum)
Clinical Social Work Department CLINICAL SOCIAL WORK PLACEMENT NOTE 01/29/2014  Patient:  Samuel Schultz, Samuel Schultz  Account Number:  [id number] Admit date:  01/26/2014  Clinical Social Worker:  Berton Mount, Latanya Presser  Date/time:  01/29/2014 03:00 PM  Clinical Social Work is seeking post-discharge placement for this patient at the following level of care:   [place]   (*CSW will update this form in Epic as items are completed)   01/29/2014  Patient/family provided with [facility] Department of Clinical Social Work's list of facilities offering this level of care within the geographic area requested by the patient (or if unable, by the patient's family).  01/29/2014  Patient/family informed of their freedom to choose among providers that offer the needed level of care, that participate in Medicare, Medicaid or managed care program needed by the patient, have an available bed and are willing to accept the patient.  01/29/2014  Patient/family informed of [facility]' ownership interest in [facility], as well as of the fact that they are under no obligation to receive care at this facility.  PASARR submitted to EDS on existing PASARR number received on   FL2 transmitted to all facilities in geographic area requested by pt/family on  01/29/2014 FL2 transmitted to all facilities within larger geographic area on   Patient informed that his/her managed care company has contracts with or will negotiate with  certain facilities, including the following:     Patient/family informed of bed offers received:  08/31 Patient chooses bed at [facility] Physician recommends and patient chooses bed at    Patient to be transferred to [place] [facility] on08/31   Patient to be transferred to facility by [facility] Patient and family notified of transfer on 02/05/2014 Name of family member notified:  Karma Lew  The following physician request were entered in Epic: Physician Request   Please sign FL2.    Additional CommentsBerton Mount, [place]

## 2014-01-29 NOTE — Progress Notes (Signed)
Patient ID: Samuel Schultz, male   DOB: 03/31/1976, 38 y.o.   MRN: [id number] Mr. Karaffa remains unchanged still with low back and right leg radicular pain  Strength remains 5 out of 5 throughout his lower extremities bilaterally  I spoke with her radiology and his new MRI compared to his July MRI does show extension the fluid collection in the so as on the right of the L4 vertebral body. This looks to be a target image be a left used to aspirate additional fluid. There and discuss amongst themselves and we'll try to plan a CT-guided aspiration of the fluid to obtain cultures. I did explained this gone over this with Dr. Linus Salmons in ID and he is in agreement.

## 2014-01-29 NOTE — Progress Notes (Signed)
    [facility] for Infectious Disease  Date of Admission:  01/26/2014  Antibiotics:  Subjective: No new complaints  Objective: Temp:  [99.1 F (37.3 C)-99.6 F (37.6 C)] 99.6 F (37.6 C) (08/24 0515) Pulse Rate:  [86-89] 86 (08/24 0515) Resp:  [15-18] 16 (08/24 0515) BP: (104-116)/(49-65) 116/65 mmHg (08/24 0515) SpO2:  [97 %-99 %] 99 % (08/24 0515)  General: Awake, alert Skin: no rashes Lungs: CTA B Cor: RRR Abdomen: soft, nt Ext: no edema  Lab Results Lab Results  Component Value Date   WBC 2.0* 01/29/2014   HGB 7.6* 01/29/2014   HCT 22.6* 01/29/2014   MCV 102.3* 01/29/2014   PLT 82* 01/29/2014    Lab Results  Component Value Date   CREATININE 0.52 01/27/2014   BUN 8 01/27/2014   NA 135* 01/27/2014   K 3.9 01/27/2014   CL 105 01/27/2014   CO2 22 01/27/2014    Lab Results  Component Value Date   ALT 7 01/27/2014   AST 24 01/27/2014   ALKPHOS 68 01/27/2014   BILITOT 2.3* 01/27/2014      Microbiology: Recent Results (from the past 240 hour(s))  CULTURE, BLOOD (ROUTINE X 2)     Status: None   Collection Time    01/26/14  4:49 PM      Result Value Ref Range Status   Specimen Description BLOOD RIGHT PICC LINE   Final   Special Requests BOTTLES DRAWN AEROBIC AND ANAEROBIC 10CC   Final   Culture  Setup Time     Final   Value: 01/26/2014 23:10     Performed at [facility]   Culture     Final   Value:        BLOOD CULTURE RECEIVED NO GROWTH TO DATE CULTURE WILL BE HELD FOR 5 DAYS BEFORE ISSUING A FINAL NEGATIVE REPORT     Performed at [facility]   Report Status PENDING   Incomplete  CULTURE, BLOOD (ROUTINE X 2)     Status: None   Collection Time    01/26/14  5:00 PM      Result Value Ref Range Status   Specimen Description BLOOD ARM LEFT   Final   Special Requests BOTTLES DRAWN AEROBIC AND ANAEROBIC 10CC   Final   Culture  Setup Time     Final   Value: 01/26/2014 23:08     Performed at [facility]   Culture     Final   Value:         BLOOD CULTURE RECEIVED NO GROWTH TO DATE CULTURE WILL BE HELD FOR 5 DAYS BEFORE ISSUING A FINAL NEGATIVE REPORT     Performed at [facility]   Report Status PENDING   Incomplete    Studies/Results: No results found.  Assessment/Plan: 1) Discitis - not responding to very broad antibiotics.  I do think an aspirate for culture including bacterial, fungal and AFB culture is definitely of benefit to try to get an idea of what is causing this.    2) HIV - continue with ARVs    Shemicka Cohrs, Herbie [place], [place] [facility] [email or web address] [phone] pager   [phone] cell 01/29/2014, 11:57 AM

## 2014-01-30 LAB — CBC
HEMATOCRIT: 22.3 % — AB (ref 39.0–52.0)
HEMOGLOBIN: 7.5 g/dL — AB (ref 13.0–17.0)
MCH: 34.2 pg — AB (ref 26.0–34.0)
MCHC: 33.6 g/dL (ref 30.0–36.0)
MCV: 101.8 fL — ABNORMAL HIGH (ref 78.0–100.0)
Platelets: 76 10*3/uL — ABNORMAL LOW (ref 150–400)
RBC: 2.19 MIL/uL — AB (ref 4.22–5.81)
RDW: 18.2 % — ABNORMAL HIGH (ref 11.5–15.5)
WBC: 1.6 10*3/uL — ABNORMAL LOW (ref 4.0–10.5)

## 2014-01-30 MED ORDER — PHYTONADIONE 5 MG PO TABS
10.0000 mg | ORAL_TABLET | Freq: Once | ORAL | Status: AC
  Administered 2014-01-30: 10 mg via ORAL
  Filled 2014-01-30: qty 2

## 2014-01-30 NOTE — Progress Notes (Signed)
CSW Armed forces technical officer) spoke with pt preferred facility [facility]. They are still reviewing pt but asked if pt Daptomycin can be switched for alternate medication for pricing reasons. CSW to ask MD.  Berton Mount, [place]

## 2014-01-30 NOTE — Progress Notes (Signed)
PROGRESS NOTE    Samuel Schultz HQI:[id number] DOB: 28-Apr-1976 DOA: 01/26/2014 PCP: [name], MD/infectious disease M.D. in [place], [place] Oncology: [facility].  HPI/Brief narrative 38 year old male hospitalized in June with polymicrobial bacteremia felt to be related to colitis. His blood cultures were positive for vancomycin and ampicillin sensitive enterococcus, vancomycin resistant enterococcus, and Candida albicans. He received a little over 2 weeks of therapy with daptomycin and fluconazole, completing therapy in late June. He was readmitted on July 5 with severe, acute back pain and was found to have L3-4 diskitis & osteomyelitis. He had lumbar aspirations on July 6 and July 10. Gram stain and culture were negative on both specimens. He was started on daptomycin and ceftazidime on July 10 and discharged to a skilled nursing facility. He presented with worsening back pain. MRI shows possible L5-S1 discitis with epidural abscess. Neurosurgery does not see acute surgical need. Infectious disease following and recommend aspiration of disc space area for cultures-scheduled for 8/26 by IR.   Assessment/Plan:  1. 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 Infectious disease following. Continue IV Daptomycin, Ceftazidime & IV fluconazole added. Back pain better controlled. Blood Cx's negative to date. Since not responding to broad spectrum Abx, on 8/24 ID recommended aspiration for Cx (Bacterial, AFB & Fungal Cx) to appropriately Rx. Neurosurgery discussed with IR who planned to do CT guided aspiration of fluid to obtain cultures on 8/26. Management per ID. 2. Pancytopenia: DD-lymphoma, HIV and meds versus other etiology. Anemia panel - ? Chronic disease ? Iron deficiency. S/p  1 unit PRBC on 8/22 for hemoglobin 6.9. Improved to 7.6. No reported bleeding. Follow. Started Iron supplements. WBC and Platelets slightly  better. 3. History of lymphoma: Apparently treated with chemotherapy in 2008 and missed for followup appointment at [facility] in 2014. Check peripheral smear- pending. Will need outpatient followup with oncology. May not be a candidate for any chemotherapy at this time was ongoing acute infection. 4. HIV/AIDS: Continue retroviral therapy. ID following 5. History of non-alcoholic cirrhosis with coagulopathy: Patient not on vitamin K supplements. We'll provide a dose of vitamin K today and check INR in a.m. given planned CT aspiration. Defer to IR regarding FFP prior to procedure.  Code Status: Full Family Communication: None at bedside Disposition Plan: SNF when medically stable.   Consultants:  Infectious disease  Neurosurgery  IR  Procedures:  None  Antibiotics:  IV daptomycin 8/22 >  IV ceftazidime 8/22 >  IV fluconazole/22 >   Subjective: Back pain intermittently worse, radiating to RLE.  Objective: Filed Vitals:   01/29/14 1438 01/29/14 2157 01/30/14 0616 01/30/14 1325  BP: 97/69 114/61 101/54 94/50  Pulse: 94 87 88 90  Temp: 99.4 F (37.4 C) 100.1 F (37.8 C) 100.2 F (37.9 C) 99.3 F (37.4 C)  TempSrc: Oral Oral Oral Oral  Resp: 16 16 15 18   Height:      Weight:   83.144 kg (183 lb 4.8 oz)   SpO2: 97% 97% 94% 94%    Intake/Output Summary (Last 24 hours) at 01/30/14 1710 Last data filed at 01/30/14 1327  Gross per 24 hour  Intake    100 ml  Output    950 ml  Net   -850 ml   Filed Weights   01/27/14 0616 01/28/14 0539 01/30/14 0616  Weight: 82.7 kg (182 lb 5.1 oz) 82.736 kg (182 lb 6.4 oz) 83.144 kg (183 lb 4.8 oz)  Exam:  General exam: Pleasant young male lying in bed without distress. Respiratory system: Clear. No increased work of breathing. Cardiovascular system: S1 & S2 heard, RRR. No JVD, murmurs, gallops, clicks or pedal edema. Gastrointestinal system: Abdomen is nondistended, soft and nontender. Normal bowel sounds heard. Central nervous  system: Alert and oriented. No focal neurological deficits. Extremities: Symmetric 5 x 5 power. Limited evaluation of right lower extremity secondary to pain.   Data Reviewed: Basic Metabolic Panel:  Recent Labs Lab 01/26/14 1648 01/27/14 0647  NA 133* 135*  K 3.9 3.9  CL 101 105  CO2 21 22  GLUCOSE 91 98  BUN 7 8  CREATININE 0.55 0.52  CALCIUM 8.3* 8.3*   Liver Function Tests:  Recent Labs Lab 01/26/14 1648 01/27/14 0647  AST 25 24  ALT 8 7  ALKPHOS 78 68  BILITOT 3.0* 2.3*  PROT 7.4 6.6  ALBUMIN 2.1* 1.9*   No results found for this basename: LIPASE, AMYLASE,  in the last 168 hours No results found for this basename: AMMONIA,  in the last 168 hours CBC:  Recent Labs Lab 01/26/14 1648 01/27/14 0647 01/27/14 1835 01/28/14 0430 01/29/14 0400 01/30/14 0422  WBC 2.3* 1.3* 2.0* 2.3* 2.0* 1.6*  NEUTROABS 1.4* 0.8*  --   --   --   --   HGB 7.8* 6.9* 7.4* 7.6* 7.6* 7.5*  HCT 22.9* 19.9* 21.6* 22.5* 22.6* 22.3*  MCV 103.6* 103.1* 99.1 100.0 102.3* 101.8*  PLT 94* 83* 76* 89* 82* 76*   Cardiac Enzymes:  Recent Labs Lab 01/27/14 0647  CKTOTAL 56   BNP (last 3 results) No results found for this basename: PROBNP,  in the last 8760 hours CBG: No results found for this basename: GLUCAP,  in the last 168 hours  Recent Results (from the past 240 hour(s))  CULTURE, BLOOD (ROUTINE X 2)     Status: None   Collection Time    01/26/14  4:49 PM      Result Value Ref Range Status   Specimen Description BLOOD RIGHT PICC LINE   Final   Special Requests BOTTLES DRAWN AEROBIC AND ANAEROBIC 10CC   Final   Culture  Setup Time     Final   Value: 01/26/2014 23:10     Performed at [facility]   Culture     Final   Value:        BLOOD CULTURE RECEIVED NO GROWTH TO DATE CULTURE WILL BE HELD FOR 5 DAYS BEFORE ISSUING A FINAL NEGATIVE REPORT     Performed at [facility]   Report Status PENDING   Incomplete  CULTURE, BLOOD (ROUTINE X 2)     Status: None    Collection Time    01/26/14  5:00 PM      Result Value Ref Range Status   Specimen Description BLOOD ARM LEFT   Final   Special Requests BOTTLES DRAWN AEROBIC AND ANAEROBIC 10CC   Final   Culture  Setup Time     Final   Value: 01/26/2014 23:08     Performed at [facility]   Culture     Final   Value:        BLOOD CULTURE RECEIVED NO GROWTH TO DATE CULTURE WILL BE HELD FOR 5 DAYS BEFORE ISSUING A FINAL NEGATIVE REPORT     Performed at [facility]   Report Status PENDING   Incomplete       Studies: No results found.  Scheduled Meds: . sodium chloride   Intravenous Once  . atovaquone  750 mg Oral Daily  . azithromycin  1,200 mg Oral Weekly  . buPROPion  300 mg Oral Daily  . cefTAZidime (FORTAZ)  IV  2 g Intravenous 3 times per day  . cyclobenzaprine  5 mg Oral QHS  . DAPTOmycin (CUBICIN)  IV  700 mg Intravenous Q24H  . Darunavir Ethanolate  800 mg Oral Q breakfast  . docusate sodium  100 mg Oral BID  . emtricitabine-tenofovir  1 tablet Oral QHS  . ferrous sulfate  325 mg Oral BID WC  . fluconazole (DIFLUCAN) IV  400 mg Intravenous Q24H  . gabapentin  400 mg Oral TID  . morphine  30 mg Oral Q12H  . multivitamin with minerals  1 tablet Oral Daily  . pantoprazole  40 mg Oral Daily  . potassium chloride SA  20 mEq Oral Daily  . ritonavir  100 mg Oral Q breakfast  . sodium chloride  3 mL Intravenous Q12H  . sucralfate  1 g Oral TID WC   Continuous Infusions:   Principal Problem:   Abscess in epidural space of lumbar spine Active Problems:   AIDS   Large cell lymphoma   Cirrhosis, non-alcoholic   Pancytopenia   Epidural abscess    Time spent: 25 minutes.    Vernell Leep, MD, FACP, FHM. [facility] Pager [phone]  If 7PM-7AM, please contact night-coverage [email or web address] Password [email or web address] 01/30/2014, 5:10 PM    LOS: 4 days

## 2014-01-30 NOTE — Progress Notes (Signed)
Patient ID: Samuel Schultz, male   DOB: [birth year], 39 y.o.   MRN: [id number] Request received from Dr. Cram/Dr. Algis Liming  for fluoroscopic guided aspiration of right L4/psoas fluid collection and L5-S1 disc space on pt. Imaging studies have been reviewed by Dr. Vernard Gambles. 38 year old HIV positive male hospitalized in June with polymicrobial bacteremia felt to be related to colitis. His blood cultures were positive for vancomycin and ampicillin sensitive enterococcus, vancomycin resistant enterococcus, and Candida albicans. He received a little over 2 weeks of therapy with daptomycin and fluconazole, completing therapy in late June. He was readmitted on July 5 with severe, acute back pain and was found to have L3-4 diskitis & osteomyelitis. He had lumbar aspirations on July 6 and July 10. Gram stain and culture were negative on both specimens. He was started on daptomycin and ceftazidime on July 10 and discharged to a skilled nursing facility. He presented with worsening back pain. MRI shows possible L5-S1 discitis with epidural abscess. Additional hx as below. Exam: pt drowsy but alert; chest- CTA bilat ant; heart- RRR; abd- soft,+BS,NT; +LBP with rad down RLE; ext without edema.   Filed Vitals:   01/29/14 0515 01/29/14 1438 01/29/14 2157 01/30/14 0616  BP: 116/65 97/69 114/61 101/54  Pulse: 86 94 87 88  Temp: 99.6 F (37.6 C) 99.4 F (37.4 C) 100.1 F (37.8 C) 100.2 F (37.9 C)  TempSrc:  Oral Oral Oral  Resp: 16 16 16 15   Height:      Weight:    183 lb 4.8 oz (83.144 kg)  SpO2: 99% 97% 97% 94%   Past Medical History  Diagnosis Date  . HIV disease   . Cirrhosis   . PTSD (post-traumatic stress disorder)   . Depressive disorder   . Cancer   . Lymphoma   . Anemia    Past Surgical History  Procedure Laterality Date  . Cholecystectomy    . Appendectomy    . Tee without cardioversion N/A 11/13/2013    Procedure: TRANSESOPHAGEAL ECHOCARDIOGRAM (TEE);  Surgeon: Dorothy Spark, MD;  Location: [facility];  Service: Cardiovascular;  Laterality: N/A;  . Infection of spine     Dg Chest 2 View  01/05/2014   CLINICAL DATA:  One week history of fever. Leukopenia. Current history of HIV, hepatic cirrhosis and lymphoma.  EXAM: CHEST  2 VIEW  COMPARISON:  Portable chest x-ray 01/01/2014, 12/16/2013, 10/16/2013. One view chest x-ray 11/06/2013.  FINDINGS: Suboptimal inspiration accounts for crowded bronchovascular markings, especially in the bases, and accentuates the cardiac silhouette. Taking this into account, cardiac silhouette upper normal in size, stable. Hilar and mediastinal contours otherwise unremarkable. Lungs clear. Bronchovascular markings normal. Pulmonary vascularity normal. No visible pleural effusions. No pneumothorax. The right arm PICC tip in the lower SVC. Visualized bony thorax intact.  IMPRESSION: Suboptimal inspiration.  No acute cardiopulmonary disease.   Electronically Signed   By: Evangeline Dakin M.D.   On: 01/05/2014 19:04   Mr Lumbar Spine W Wo Contrast  01/26/2014   CLINICAL DATA:  History of spinal infection. On antibiotics. Increasing back pain and right leg weakness. HIV, cirrhosis  EXAM: MRI LUMBAR SPINE WITHOUT AND WITH CONTRAST  TECHNIQUE: Multiplanar and multiecho pulse sequences of the lumbar spine were obtained without and with intravenous contrast.  CONTRAST:  98mL MULTIHANCE GADOBENATE DIMEGLUMINE 529 MG/ML IV SOLN  COMPARISON:  Lumbar MRI 12/10/2013  FINDINGS: Image quality degraded by technical factors and significant motion.  Discitis and osteomyelitis at L3-4 as noted on prior studies.  Diffuse bone marrow edema is present similar to the prior MRI. There is progressive loss of disc space at this level due to infection. No epidural abscess at L3-4. No fracture.  New area of edema in the disc space at L5-S1 with mild adjacent bone marrow edema. Interval development of epidural abscess just below the L5-S1 level to the left of midline. This was not present previously  and shows rim enhancement and central fluid signal intensity. No significant nerve root compression although the abscess may be touching the left S1 nerve root.  Negative for acute fracture. Conus medullaris not optimally seen on this study.  IMPRESSION: Disc space infection L3-4 progressive disc space narrowing but no epidural abscess at L3-4.  Interval development of epidural abscess to left of midline just below the L5-S1 disc space. Presumably the L5-S1 disc space is also infected at this time. The abscess was not present on prior studies.  These results were called by telephone at the time of interpretation on 01/26/2014 at 9:27 pm to Dr. Deno Etienne , who verbally acknowledged these results.   Electronically Signed   By: Franchot Gallo M.D.   On: 01/26/2014 21:29   Dg Chest Port 1 View  01/01/2014   CLINICAL DATA:  Shortness of breath  EXAM: PORTABLE CHEST - 1 VIEW  COMPARISON:  Portable chest x-ray of December 16, 2013  FINDINGS: The lungs are borderline hypoinflated but clear. Suspected right basilar atelectasis has cleared. The cardiac silhouette is mildly enlarged. The pulmonary vascularity is prominent centrally. There is no pleural effusion. The PICC line tip lies in the midportion of the SVC. The bony thorax is unremarkable.  IMPRESSION: There is bilateral pulmonary hypo inflation. There is no evidence of CHF nor of pneumonia.   Electronically Signed   By: David  Martinique   On: 01/01/2014 16:18   Dg Hand Complete Left  01/01/2014   CLINICAL DATA:  Increasing left hand pain  EXAM: LEFT HAND - COMPLETE 3+ VIEW  COMPARISON:  None.  FINDINGS: There is no evidence of fracture or dislocation. There is no evidence of arthropathy or other focal bone abnormality. Soft tissues are unremarkable.  IMPRESSION: Negative.   Electronically Signed   By: Skipper Cliche M.D.   On: 01/01/2014 11:37  Results for orders placed during the hospital encounter of 01/26/14  CULTURE, BLOOD (ROUTINE X 2)      Result Value Ref  Range   Specimen Description BLOOD RIGHT PICC LINE     Special Requests BOTTLES DRAWN AEROBIC AND ANAEROBIC 10CC     Culture  Setup Time       Value: 01/26/2014 23:10     Performed at [facility]   Culture       Value:        BLOOD CULTURE RECEIVED NO GROWTH TO DATE CULTURE WILL BE HELD FOR 5 DAYS BEFORE ISSUING A FINAL NEGATIVE REPORT     Performed at [facility]   Report Status PENDING    CULTURE, BLOOD (ROUTINE X 2)      Result Value Ref Range   Specimen Description BLOOD ARM LEFT     Special Requests BOTTLES DRAWN AEROBIC AND ANAEROBIC 10CC     Culture  Setup Time       Value: 01/26/2014 23:08     Performed at [facility]   Culture       Value:        BLOOD CULTURE RECEIVED NO GROWTH TO DATE CULTURE WILL  BE HELD FOR 5 DAYS BEFORE ISSUING A FINAL NEGATIVE REPORT     Performed at [facility]   Report Status PENDING    CBC WITH DIFFERENTIAL      Result Value Ref Range   WBC 2.3 (*) 4.0 - 10.5 K/uL   RBC 2.21 (*) 4.22 - 5.81 MIL/uL   Hemoglobin 7.8 (*) 13.0 - 17.0 g/dL   HCT 22.9 (*) 39.0 - 52.0 %   MCV 103.6 (*) 78.0 - 100.0 fL   MCH 35.3 (*) 26.0 - 34.0 pg   MCHC 34.1  30.0 - 36.0 g/dL   RDW 15.8 (*) 11.5 - 15.5 %   Platelets 94 (*) 150 - 400 K/uL   Neutrophils Relative % 61  43 - 77 %   Neutro Abs 1.4 (*) 1.7 - 7.7 K/uL   Lymphocytes Relative 26  12 - 46 %   Lymphs Abs 0.6 (*) 0.7 - 4.0 K/uL   Monocytes Relative 10  3 - 12 %   Monocytes Absolute 0.2  0.1 - 1.0 K/uL   Eosinophils Relative 2  0 - 5 %   Eosinophils Absolute 0.1  0.0 - 0.7 K/uL   Basophils Relative 0  0 - 1 %   Basophils Absolute 0.0  0.0 - 0.1 K/uL  COMPREHENSIVE METABOLIC PANEL      Result Value Ref Range   Sodium 133 (*) 137 - 147 mEq/L   Potassium 3.9  3.7 - 5.3 mEq/L   Chloride 101  96 - 112 mEq/L   CO2 21  19 - 32 mEq/L   Glucose, Bld 91  70 - 99 mg/dL   BUN 7  6 - 23 mg/dL   Creatinine, Ser 0.55  0.50 - 1.35 mg/dL   Calcium 8.3 (*) 8.4 - 10.5 mg/dL   Total  Protein 7.4  6.0 - 8.3 g/dL   Albumin 2.1 (*) 3.5 - 5.2 g/dL   AST 25  0 - 37 U/L   ALT 8  0 - 53 U/L   Alkaline Phosphatase 78  39 - 117 U/L   Total Bilirubin 3.0 (*) 0.3 - 1.2 mg/dL   GFR calc non Af Amer >90  >90 mL/min   GFR calc Af Amer >90  >90 mL/min   Anion gap 11  5 - 15  CK      Result Value Ref Range   Total CK 56  7 - 232 U/L  COMPREHENSIVE METABOLIC PANEL      Result Value Ref Range   Sodium 135 (*) 137 - 147 mEq/L   Potassium 3.9  3.7 - 5.3 mEq/L   Chloride 105  96 - 112 mEq/L   CO2 22  19 - 32 mEq/L   Glucose, Bld 98  70 - 99 mg/dL   BUN 8  6 - 23 mg/dL   Creatinine, Ser 0.52  0.50 - 1.35 mg/dL   Calcium 8.3 (*) 8.4 - 10.5 mg/dL   Total Protein 6.6  6.0 - 8.3 g/dL   Albumin 1.9 (*) 3.5 - 5.2 g/dL   AST 24  0 - 37 U/L   ALT 7  0 - 53 U/L   Alkaline Phosphatase 68  39 - 117 U/L   Total Bilirubin 2.3 (*) 0.3 - 1.2 mg/dL   GFR calc non Af Amer >90  >90 mL/min   GFR calc Af Amer >90  >90 mL/min   Anion gap 8  5 - 15  CBC WITH DIFFERENTIAL  Result Value Ref Range   WBC 1.3 (*) 4.0 - 10.5 K/uL   RBC 1.93 (*) 4.22 - 5.81 MIL/uL   Hemoglobin 6.9 (*) 13.0 - 17.0 g/dL   HCT 19.9 (*) 39.0 - 52.0 %   MCV 103.1 (*) 78.0 - 100.0 fL   MCH 35.8 (*) 26.0 - 34.0 pg   MCHC 34.7  30.0 - 36.0 g/dL   RDW 15.5  11.5 - 15.5 %   Platelets 83 (*) 150 - 400 K/uL   Neutrophils Relative % 54  43 - 77 %   Lymphocytes Relative 32  12 - 46 %   Monocytes Relative 10  3 - 12 %   Eosinophils Relative 3  0 - 5 %   Basophils Relative 1  0 - 1 %   Neutro Abs 0.8 (*) 1.7 - 7.7 K/uL   Lymphs Abs 0.4 (*) 0.7 - 4.0 K/uL   Monocytes Absolute 0.1  0.1 - 1.0 K/uL   Eosinophils Absolute 0.0  0.0 - 0.7 K/uL   Basophils Absolute 0.0  0.0 - 0.1 K/uL   Smear Review MORPHOLOGY UNREMARKABLE    VITAMIN B12      Result Value Ref Range   Vitamin B-12 1155 (*) 211 - 911 pg/mL  FOLATE      Result Value Ref Range   Folate 11.4    IRON AND TIBC      Result Value Ref Range   Iron 37 (*) 42 - 135  ug/dL   TIBC 104 (*) 215 - 435 ug/dL   Saturation Ratios 36  20 - 55 %   UIBC 67 (*) 125 - 400 ug/dL  FERRITIN      Result Value Ref Range   Ferritin 354 (*) 22 - 322 ng/mL  RETICULOCYTES      Result Value Ref Range   Retic Ct Pct 4.1 (*) 0.4 - 3.1 %   RBC. 1.89 (*) 4.22 - 5.81 MIL/uL   Retic Count, Manual 77.5  19.0 - 186.0 K/uL  PATHOLOGIST SMEAR REVIEW      Result Value Ref Range   Path Review PANCYTOPENIA WITH MACROCYTIC    T-HELPER CELLS (CD4) COUNT      Result Value Ref Range   CD4 T Cell Abs 40 (*) 400 - 2700 /uL   CD4 % Helper T Cell 6 (*) 33 - 55 %  HIV 1 RNA QUANT-NO REFLEX-BLD      Result Value Ref Range   HIV 1 RNA Quant <20  <20 copies/mL   HIV1 RNA Quant, Log <1.30  <1.30 log 10  CBC      Result Value Ref Range   WBC 2.0 (*) 4.0 - 10.5 K/uL   RBC 2.18 (*) 4.22 - 5.81 MIL/uL   Hemoglobin 7.4 (*) 13.0 - 17.0 g/dL   HCT 21.6 (*) 39.0 - 52.0 %   MCV 99.1  78.0 - 100.0 fL   MCH 33.9  26.0 - 34.0 pg   MCHC 34.3  30.0 - 36.0 g/dL   RDW 17.7 (*) 11.5 - 15.5 %   Platelets 76 (*) 150 - 400 K/uL  CBC      Result Value Ref Range   WBC 2.3 (*) 4.0 - 10.5 K/uL   RBC 2.25 (*) 4.22 - 5.81 MIL/uL   Hemoglobin 7.6 (*) 13.0 - 17.0 g/dL   HCT 22.5 (*) 39.0 - 52.0 %   MCV 100.0  78.0 - 100.0 fL   MCH 33.8  26.0 - 34.0 pg  MCHC 33.8  30.0 - 36.0 g/dL   RDW 18.5 (*) 11.5 - 15.5 %   Platelets 89 (*) 150 - 400 K/uL  CBC      Result Value Ref Range   WBC 2.0 (*) 4.0 - 10.5 K/uL   RBC 2.21 (*) 4.22 - 5.81 MIL/uL   Hemoglobin 7.6 (*) 13.0 - 17.0 g/dL   HCT 22.6 (*) 39.0 - 52.0 %   MCV 102.3 (*) 78.0 - 100.0 fL   MCH 34.4 (*) 26.0 - 34.0 pg   MCHC 33.6  30.0 - 36.0 g/dL   RDW 18.5 (*) 11.5 - 15.5 %   Platelets 82 (*) 150 - 400 K/uL  CBC      Result Value Ref Range   WBC 1.6 (*) 4.0 - 10.5 K/uL   RBC 2.19 (*) 4.22 - 5.81 MIL/uL   Hemoglobin 7.5 (*) 13.0 - 17.0 g/dL   HCT 22.3 (*) 39.0 - 52.0 %   MCV 101.8 (*) 78.0 - 100.0 fL   MCH 34.2 (*) 26.0 - 34.0 pg   MCHC 33.6   30.0 - 36.0 g/dL   RDW 18.2 (*) 11.5 - 15.5 %   Platelets 76 (*) 150 - 400 K/uL  TYPE AND SCREEN      Result Value Ref Range   ABO/RH(D) A POS     Antibody Screen NEG     Sample Expiration 01/30/2014     Unit Number [id number]     Blood Component Type RBC, LR IRR     Unit division 00     Status of Unit ISSUED,FINAL     Transfusion Status OK TO TRANSFUSE     Crossmatch Result Compatible    PREPARE RBC (CROSSMATCH)      Result Value Ref Range   Order Confirmation ORDER PROCESSED BY BLOOD BANK     A/P: 38 year old HIV positive male hospitalized in June with polymicrobial bacteremia felt to be related to colitis. His blood cultures were positive for vancomycin and ampicillin sensitive enterococcus, vancomycin resistant enterococcus, and Candida albicans. He received a little over 2 weeks of therapy with daptomycin and fluconazole, completing therapy in late June. He was readmitted on July 5 with severe, acute back pain and was found to have L3-4 diskitis & osteomyelitis. He had lumbar aspirations on July 6 and July 10. Gram stain and culture were negative on both specimens. He was started on daptomycin and ceftazidime on July 10 and discharged to a skilled nursing facility. He presented with worsening back pain. MRI shows possible L5-S1 discitis with epidural abscess. Plan is for fluoroscopic guided aspiration of right L4 fluid collection and L5-S1 disc space on 8/26. Details/risks of procedure d/w pt with his understanding and consent.

## 2014-01-30 NOTE — Progress Notes (Signed)
    [facility] for Infectious Disease  Date of Admission:  01/26/2014  Antibiotics: daptomycin Ceftazidime Fluconazole DRV/r Truvada Weekly azithromycin PCP prophylaxis  Subjective: No new complaints  Objective: Temp:  [99.3 F (37.4 C)-100.2 F (37.9 C)] 99.3 F (37.4 C) (08/25 1325) Pulse Rate:  [87-90] 90 (08/25 1325) Resp:  [15-18] 18 (08/25 1325) BP: (94-114)/(50-61) 94/50 mmHg (08/25 1325) SpO2:  [94 %-97 %] 94 % (08/25 1325) Weight:  [183 lb 4.8 oz (83.144 kg)] 183 lb 4.8 oz (83.144 kg) (08/25 0616)  General: Awake, alert Skin: no rashes Lungs: CTA B Cor: RRR Abdomen: soft, nt Ext: no edema  Lab Results Lab Results  Component Value Date   WBC 1.6* 01/30/2014   HGB 7.5* 01/30/2014   HCT 22.3* 01/30/2014   MCV 101.8* 01/30/2014   PLT 76* 01/30/2014    Lab Results  Component Value Date   CREATININE 0.52 01/27/2014   BUN 8 01/27/2014   NA 135* 01/27/2014   K 3.9 01/27/2014   CL 105 01/27/2014   CO2 22 01/27/2014    Lab Results  Component Value Date   ALT 7 01/27/2014   AST 24 01/27/2014   ALKPHOS 68 01/27/2014   BILITOT 2.3* 01/27/2014      Microbiology: Recent Results (from the past 240 hour(s))  CULTURE, BLOOD (ROUTINE X 2)     Status: None   Collection Time    01/26/14  4:49 PM      Result Value Ref Range Status   Specimen Description BLOOD RIGHT PICC LINE   Final   Special Requests BOTTLES DRAWN AEROBIC AND ANAEROBIC 10CC   Final   Culture  Setup Time     Final   Value: 01/26/2014 23:10     Performed at [facility]   Culture     Final   Value:        BLOOD CULTURE RECEIVED NO GROWTH TO DATE CULTURE WILL BE HELD FOR 5 DAYS BEFORE ISSUING A FINAL NEGATIVE REPORT     Performed at [facility]   Report Status PENDING   Incomplete  CULTURE, BLOOD (ROUTINE X 2)     Status: None   Collection Time    01/26/14  5:00 PM      Result Value Ref Range Status   Specimen Description BLOOD ARM LEFT   Final   Special Requests BOTTLES DRAWN  AEROBIC AND ANAEROBIC 10CC   Final   Culture  Setup Time     Final   Value: 01/26/2014 23:08     Performed at [facility]   Culture     Final   Value:        BLOOD CULTURE RECEIVED NO GROWTH TO DATE CULTURE WILL BE HELD FOR 5 DAYS BEFORE ISSUING A FINAL NEGATIVE REPORT     Performed at [facility]   Report Status PENDING   Incomplete    Studies/Results: No results found.  Assessment/Plan: 1) Discitis - not responding to very broad antibiotics. Discussed case with Dr. Saintclair Halsted and IR.  They are going to aspirate disc (IR) area and fluid/phlegmon collection both and send for appropriate studies - bacterial, fungal and AFB.    2) HIV - continue with ARVs, OI prophylaxis  Alaia Lordi, [facility] [place] [email or web address] [phone] pager   [phone] cell 01/30/2014, 3:33 PM

## 2014-01-30 NOTE — Progress Notes (Signed)
Physical Therapy Treatment Patient Details Name: Samuel Schultz MRN: [id number] DOB: [birth year] Today's Date: 01/30/2014    History of Present Illness Pt is a 38 y.o. male with history of HIV/AIDS, lymphoma, nonalcoholic cirrhosis, coagulopathy, pancytopenia presented to the ER because of worsening back pain. Patient states that he has been having pain in his low back last few days it has been radiating to his right leg. Denies any fever chills. Patient was recently admitted for discitis and is on IV daptomycin and ceftriaxone. MRI L-spine shows epidural abscess L5-S1 area. On-call neurosurgeon Dr. Saintclair Halsted was consulted and Dr. Saintclair Halsted at this time feels patient does not have any cauda equina features.    PT Comments    Pt with eyes closed throughout session reporting 10/10 pain with RN notified and provided meds during session. Pt willing to mobilize slowly with significantly increased time between and during transfers. Pt educated for back precautions to assist with decreasing pain as well as improving  Mobility. Will continue to follow.   Follow Up Recommendations  SNF;Supervision/Assistance - 24 hour     Equipment Recommendations       Recommendations for Other Services       Precautions / Restrictions Precautions Precautions: Fall Precaution Comments: educated on back precautions for safety and decreased pain    Mobility  Bed Mobility Overal bed mobility: Needs Assistance Bed Mobility: Rolling;Sidelying to Sit Rolling: Min assist Sidelying to sit: Mod assist       General bed mobility comments: cues for sequence and technique with assist to elevate trunk from surface  Transfers Overall transfer level: Needs assistance   Transfers: Sit to/from Stand Sit to Stand: Min assist         General transfer comment: cues for hand placement and posture as pt trying to pull up on RW  Ambulation/Gait Ambulation/Gait assistance: Min assist Ambulation Distance (Feet): 25  Feet Assistive device: Rolling walker (2 wheeled) Gait Pattern/deviations: Step-to pattern;Decreased stride length;Narrow base of support   Gait velocity interpretation: Below normal speed for age/gender General Gait Details: Pt with cues for sequence, bil UE use of RW to decrease weight and pain, very slow painful gait pattern with encouragement and cues for breathing technique throughout   Stairs            Wheelchair Mobility    Modified Rankin (Stroke Patients Only)       Balance Overall balance assessment: Needs assistance   Sitting balance-Leahy Scale: Good Sitting balance - Comments: Pt sat EOB10 min prior to transfer to standing     Standing balance-Leahy Scale: Poor                      Cognition Arousal/Alertness: Awake/alert Behavior During Therapy: Flat affect Overall Cognitive Status: Within Functional Limits for tasks assessed                      Exercises      General Comments        Pertinent Vitals/Pain Pain Assessment: 0-10 Pain Score: 10-Worst pain ever Pain Location: back radiating to RLE Pain Descriptors / Indicators: Aching Pain Intervention(s): RN gave pain meds during session;Repositioned;Utilized relaxation techniques    Home Living                      Prior Function            PT Goals (current goals can now be found in the care plan section) Progress  towards PT goals: Progressing toward goals    Frequency  Min 2X/week    PT Plan Current plan remains appropriate    Co-evaluation             End of Session Equipment Utilized During Treatment: Gait belt Activity Tolerance: Patient limited by pain Patient left: in chair;with call bell/phone within Schultz;with chair alarm set;with nursing/sitter in room     Time: 5300-5110 PT Time Calculation (min): 31 min  Charges:  $Gait Training: 8-22 mins $Therapeutic Activity: 8-22 mins                    G Codes:      Samuel Schultz 01/30/2014, 11:00 AM Samuel Schultz, Samuel Schultz

## 2014-01-31 ENCOUNTER — Inpatient Hospital Stay (HOSPITAL_COMMUNITY): Payer: Medicaid Other

## 2014-01-31 DIAGNOSIS — M519 Unspecified thoracic, thoracolumbar and lumbosacral intervertebral disc disorder: Secondary | ICD-10-CM

## 2014-01-31 DIAGNOSIS — N179 Acute kidney failure, unspecified: Secondary | ICD-10-CM

## 2014-01-31 LAB — BASIC METABOLIC PANEL
Anion gap: 6 (ref 5–15)
BUN: 5 mg/dL — AB (ref 6–23)
CO2: 26 mEq/L (ref 19–32)
Calcium: 8.5 mg/dL (ref 8.4–10.5)
Chloride: 100 mEq/L (ref 96–112)
Creatinine, Ser: 0.47 mg/dL — ABNORMAL LOW (ref 0.50–1.35)
GFR calc Af Amer: >90 mL/min (ref >90)
GLUCOSE: 81 mg/dL (ref 70–99)
Potassium: 4.4 mEq/L (ref 3.7–5.3)
Sodium: 132 mEq/L — ABNORMAL LOW (ref 137–147)

## 2014-01-31 LAB — PROTIME-INR
INR: 1.86 — ABNORMAL HIGH (ref 0.00–1.49)
Prothrombin Time: 21.4 seconds — ABNORMAL HIGH (ref 11.6–15.2)

## 2014-01-31 LAB — CBC
HEMATOCRIT: 22.5 % — AB (ref 39.0–52.0)
Hemoglobin: 7.5 g/dL — ABNORMAL LOW (ref 13.0–17.0)
MCH: 33.5 pg (ref 26.0–34.0)
MCHC: 33.3 g/dL (ref 30.0–36.0)
MCV: 100.4 fL — AB (ref 78.0–100.0)
PLATELETS: 83 10*3/uL — AB (ref 150–400)
RBC: 2.24 MIL/uL — AB (ref 4.22–5.81)
RDW: 18 % — ABNORMAL HIGH (ref 11.5–15.5)
WBC: 1.7 10*3/uL — ABNORMAL LOW (ref 4.0–10.5)

## 2014-01-31 MED ORDER — FENTANYL CITRATE 0.05 MG/ML IJ SOLN
INTRAMUSCULAR | Status: AC
  Filled 2014-01-31: qty 2

## 2014-01-31 MED ORDER — MIDAZOLAM HCL 2 MG/2ML IJ SOLN
INTRAMUSCULAR | Status: AC
  Filled 2014-01-31: qty 2

## 2014-01-31 MED ORDER — HYDROMORPHONE HCL PF 1 MG/ML IJ SOLN
INTRAMUSCULAR | Status: AC
  Filled 2014-01-31: qty 2

## 2014-01-31 MED ORDER — SODIUM CHLORIDE 0.9 % IJ SOLN
INTRAMUSCULAR | Status: AC
  Filled 2014-01-31: qty 10

## 2014-01-31 MED ORDER — MIDAZOLAM HCL 2 MG/2ML IJ SOLN
INTRAMUSCULAR | Status: AC | PRN
  Administered 2014-01-31 (×4): 1 mg via INTRAVENOUS

## 2014-01-31 MED ORDER — FENTANYL CITRATE 0.05 MG/ML IJ SOLN
INTRAMUSCULAR | Status: AC | PRN
  Administered 2014-01-31 (×4): 50 ug via INTRAVENOUS

## 2014-01-31 MED ORDER — LIDOCAINE HCL 1 % IJ SOLN
INTRAMUSCULAR | Status: AC
  Filled 2014-01-31: qty 20

## 2014-01-31 MED ORDER — HYDROMORPHONE HCL PF 1 MG/ML IJ SOLN
INTRAMUSCULAR | Status: AC | PRN
  Administered 2014-01-31 (×2): 1 mg

## 2014-01-31 NOTE — Progress Notes (Signed)
PROGRESS NOTE    Samuel Schultz IHW:[id number] DOB: 1975-12-29 DOA: 01/26/2014 PCP: [name], MD/infectious disease M.D. in [place], [place] Oncology: [facility].  HPI/Brief narrative 37 year old male hospitalized in June with polymicrobial bacteremia felt to be related to colitis. His blood cultures were positive for vancomycin and ampicillin sensitive enterococcus, vancomycin resistant enterococcus, and Candida albicans. He received a little over 2 weeks of therapy with daptomycin and fluconazole, completing therapy in late June. He was readmitted on July 5 with severe, acute back pain and was found to have L3-4 diskitis & osteomyelitis. He had lumbar aspirations on July 6 and July 10. Gram stain and culture were negative on both specimens. He was started on daptomycin and ceftazidime on July 10 and discharged to a skilled nursing facility. He presented with worsening back pain. MRI shows possible L5-S1 discitis with epidural abscess. Neurosurgery does not see acute surgical need. Infectious disease following and recommend aspiration of disc space area for cultures-scheduled for 8/26 by IR.   Assessment/Plan:  1. 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 Infectious disease following. Continue IV Daptomycin, Ceftazidime & IV fluconazole added. Back pain better controlled. Blood Cx's negative to date. Since not responding to broad spectrum Abx, on 8/24 ID recommended aspiration for Cx (Bacterial, AFB & Fungal Cx) to appropriately Rx. He was taken to radiology on 01/31/2014 where he underwent fluoro guided aspiration. Pending cultures.  2. Pancytopenia: DD-lymphoma, HIV and meds versus other etiology. Anemia panel - ? Chronic disease ? Iron deficiency. S/p  1 unit PRBC on 8/22 for hemoglobin 6.9. Improved to 7.6. No reported bleeding. Follow. Started Iron supplements. WBC and Platelets slightly better. 3. History of lymphoma:  Apparently treated with chemotherapy in 2008 and missed for followup appointment at [facility] in 2014. Check peripheral smear- pending. Will need outpatient followup with oncology. May not be a candidate for any chemotherapy at this time was ongoing acute infection. 4. HIV/AIDS: Continue retroviral therapy. ID following History of non-alcoholic cirrhosis with coagulopathy: Patient was given Vit K for procedure. Has INR of 1.86  Code Status: Full Family Communication: None at bedside Disposition Plan: SNF when medically stable.   Consultants:  Infectious disease  Neurosurgery  IR  Procedures:  None  Antibiotics:  IV daptomycin 8/22 >  IV ceftazidime 8/22 >  IV fluconazole/22 >   Subjective: He complains of ongoing back pain with radiation down right lower extremity  Objective: Filed Vitals:   01/31/14 1130 01/31/14 1135 01/31/14 1140 01/31/14 1153  BP: 123/67 107/60 108/55 108/55  Pulse: 100 105 100   Temp:      TempSrc:      Resp: 18 25 15 16   Height:      Weight:      SpO2: 100% 97% 94%     Intake/Output Summary (Last 24 hours) at 01/31/14 1418 Last data filed at 01/31/14 0831  Gross per 24 hour  Intake      0 ml  Output   1275 ml  Net  -1275 ml   Filed Weights   01/27/14 0616 01/28/14 0539 01/30/14 0616  Weight: 82.7 kg (182 lb 5.1 oz) 82.736 kg (182 lb 6.4 oz) 83.144 kg (183 lb 4.8 oz)     Exam:  General exam: Pleasant young male lying in bed without distress. Respiratory system: Clear. No increased work of breathing. Cardiovascular system: S1 & S2 heard, RRR. No JVD, murmurs, gallops, clicks or pedal edema.  Gastrointestinal system: Abdomen is nondistended, soft and nontender. Normal bowel sounds heard. Central nervous system: Alert and oriented. No focal neurological deficits. Extremities: Symmetric 5 x 5 power. Limited evaluation of right lower extremity secondary to pain.   Data Reviewed: Basic Metabolic Panel:  Recent Labs Lab 01/26/14 1648  01/27/14 0647 01/31/14 0505  NA 133* 135* 132*  K 3.9 3.9 4.4  CL 101 105 100  CO2 21 22 26   GLUCOSE 91 98 81  BUN 7 8 5*  CREATININE 0.55 0.52 0.47*  CALCIUM 8.3* 8.3* 8.5   Liver Function Tests:  Recent Labs Lab 01/26/14 1648 01/27/14 0647  AST 25 24  ALT 8 7  ALKPHOS 78 68  BILITOT 3.0* 2.3*  PROT 7.4 6.6  ALBUMIN 2.1* 1.9*   No results found for this basename: LIPASE, AMYLASE,  in the last 168 hours No results found for this basename: AMMONIA,  in the last 168 hours CBC:  Recent Labs Lab 01/26/14 1648 01/27/14 0647 01/27/14 1835 01/28/14 0430 01/29/14 0400 01/30/14 0422 01/31/14 0505  WBC 2.3* 1.3* 2.0* 2.3* 2.0* 1.6* 1.7*  NEUTROABS 1.4* 0.8*  --   --   --   --   --   HGB 7.8* 6.9* 7.4* 7.6* 7.6* 7.5* 7.5*  HCT 22.9* 19.9* 21.6* 22.5* 22.6* 22.3* 22.5*  MCV 103.6* 103.1* 99.1 100.0 102.3* 101.8* 100.4*  PLT 94* 83* 76* 89* 82* 76* 83*   Cardiac Enzymes:  Recent Labs Lab 01/27/14 0647  CKTOTAL 56   BNP (last 3 results) No results found for this basename: PROBNP,  in the last 8760 hours CBG: No results found for this basename: GLUCAP,  in the last 168 hours  Recent Results (from the past 240 hour(s))  CULTURE, BLOOD (ROUTINE X 2)     Status: None   Collection Time    01/26/14  4:49 PM      Result Value Ref Range Status   Specimen Description BLOOD RIGHT PICC LINE   Final   Special Requests BOTTLES DRAWN AEROBIC AND ANAEROBIC 10CC   Final   Culture  Setup Time     Final   Value: 01/26/2014 23:10     Performed at [facility]   Culture     Final   Value:        BLOOD CULTURE RECEIVED NO GROWTH TO DATE CULTURE WILL BE HELD FOR 5 DAYS BEFORE ISSUING A FINAL NEGATIVE REPORT     Performed at [facility]   Report Status PENDING   Incomplete  CULTURE, BLOOD (ROUTINE X 2)     Status: None   Collection Time    01/26/14  5:00 PM      Result Value Ref Range Status   Specimen Description BLOOD ARM LEFT   Final   Special Requests  BOTTLES DRAWN AEROBIC AND ANAEROBIC 10CC   Final   Culture  Setup Time     Final   Value: 01/26/2014 23:08     Performed at [facility]   Culture     Final   Value:        BLOOD CULTURE RECEIVED NO GROWTH TO DATE CULTURE WILL BE HELD FOR 5 DAYS BEFORE ISSUING A FINAL NEGATIVE REPORT     Performed at [facility]   Report Status PENDING   Incomplete       Studies: No results found.      Scheduled Meds: . sodium chloride   Intravenous Once  . atovaquone  750 mg Oral Daily  . azithromycin  1,200 mg Oral Weekly  . buPROPion  300 mg Oral Daily  . cefTAZidime (FORTAZ)  IV  2 g Intravenous 3 times per day  . cyclobenzaprine  5 mg Oral QHS  . DAPTOmycin (CUBICIN)  IV  700 mg Intravenous Q24H  . Darunavir Ethanolate  800 mg Oral Q breakfast  . docusate sodium  100 mg Oral BID  . emtricitabine-tenofovir  1 tablet Oral QHS  . fentaNYL      . fentaNYL      . ferrous sulfate  325 mg Oral BID WC  . fluconazole (DIFLUCAN) IV  400 mg Intravenous Q24H  . gabapentin  400 mg Oral TID  . HYDROmorphone      . lidocaine      . midazolam      . midazolam      . morphine  30 mg Oral Q12H  . multivitamin with minerals  1 tablet Oral Daily  . pantoprazole  40 mg Oral Daily  . potassium chloride SA  20 mEq Oral Daily  . ritonavir  100 mg Oral Q breakfast  . sodium chloride  3 mL Intravenous Q12H  . sodium chloride      . sucralfate  1 g Oral TID WC   Continuous Infusions:   Principal Problem:   Abscess in epidural space of lumbar spine Active Problems:   AIDS   Large cell lymphoma   Cirrhosis, non-alcoholic   Pancytopenia   Epidural abscess    Time spent: 25 minutes.    Kelvin Cellar, MD, FACP, FHM. [facility] Pager [phone]  If 7PM-7AM, please contact night-coverage [email or web address] Password [email or web address] 01/31/2014, 2:18 PM    LOS: 5 days

## 2014-01-31 NOTE — Progress Notes (Signed)
ANTIBIOTIC CONSULT NOTE - INITIAL  Pharmacy Consult for Daptomycin Indication: Epidural abscess, osteomyelitis  Allergies  Allergen Reactions  . Codeine Anaphylaxis, Hives, Nausea And Vomiting, Nausea Only and Swelling  . Oxycodone Other (See Comments)    Other reaction(s): Dizziness Lightheaded, nausea    Labs:  Recent Labs  01/29/14 0400 01/30/14 0422 01/31/14 0505  WBC 2.0* 1.6* 1.7*  HGB 7.6* 7.5* 7.5*  PLT 82* 76* 83*  CREATININE  --   --  0.47*     Microbiology: Recent Results (from the past 720 hour(s))  URINE CULTURE     Status: None   Collection Time    01/01/14  3:58 PM      Result Value Ref Range Status   Specimen Description URINE, CLEAN CATCH   Final   Special Requests NONE   Final   Culture  Setup Time     Final   Value: 01/01/2014 20:43     Performed at [place]     Final   Value: NO GROWTH     Performed at [facility]   Culture     Final   Value: NO GROWTH     Performed at [facility]   Report Status 01/02/2014 FINAL   Final  CULTURE, BLOOD (ROUTINE X 2)     Status: None   Collection Time    01/01/14  4:25 PM      Result Value Ref Range Status   Specimen Description BLOOD LEFT ARM   Final   Special Requests BOTTLES DRAWN AEROBIC AND ANAEROBIC 10CC   Final   Culture  Setup Time     Final   Value: 01/01/2014 22:07     Performed at [facility]   Culture     Final   Value: NO GROWTH 5 DAYS     Performed at [facility]   Report Status 01/07/2014 FINAL   Final  CULTURE, BLOOD (ROUTINE X 2)     Status: None   Collection Time    01/01/14  4:31 PM      Result Value Ref Range Status   Specimen Description BLOOD LEFT HAND   Final   Special Requests BOTTLES DRAWN AEROBIC ONLY 10CC   Final   Culture  Setup Time     Final   Value: 01/01/2014 22:06     Performed at [facility]   Culture     Final   Value: NO GROWTH 5 DAYS     Performed at [facility]   Report Status  01/07/2014 FINAL   Final  CULTURE, BLOOD (ROUTINE X 2)     Status: None   Collection Time    01/05/14  7:10 PM      Result Value Ref Range Status   Specimen Description BLOOD LEFT ANTECUBITAL   Final   Special Requests BOTTLES DRAWN AEROBIC AND ANAEROBIC 6CC   Final   Culture NO GROWTH 5 DAYS   Final   Report Status 01/10/2014 FINAL   Final  CULTURE, BLOOD (ROUTINE X 2)     Status: None   Collection Time    01/05/14  7:38 PM      Result Value Ref Range Status   Specimen Description BLOOD LEFT ANTECUBITAL   Final   Special Requests BOTTLES DRAWN AEROBIC AND ANAEROBIC [place]   Final   Culture NO GROWTH 5 DAYS   Final   Report Status 01/10/2014 FINAL   Final  URINE CULTURE     Status: None   Collection Time    01/05/14  7:56 PM      Result Value Ref Range Status   Specimen Description URINE, CLEAN CATCH   Final   Special Requests Immunocompromised   Final   Culture  Setup Time     Final   Value: 01/06/2014 19:39     Performed at [place]     Final   Value: NO GROWTH     Performed at [facility]   Culture     Final   Value: NO GROWTH     Performed at [facility]   Report Status 01/07/2014 FINAL   Final  CULTURE, BLOOD (ROUTINE X 2)     Status: None   Collection Time    01/26/14  4:49 PM      Result Value Ref Range Status   Specimen Description BLOOD RIGHT PICC LINE   Final   Special Requests BOTTLES DRAWN AEROBIC AND ANAEROBIC 10CC   Final   Culture  Setup Time     Final   Value: 01/26/2014 23:10     Performed at [facility]   Culture     Final   Value:        BLOOD CULTURE RECEIVED NO GROWTH TO DATE CULTURE WILL BE HELD FOR 5 DAYS BEFORE ISSUING A FINAL NEGATIVE REPORT     Performed at [facility]   Report Status PENDING   Incomplete  CULTURE, BLOOD (ROUTINE X 2)     Status: None   Collection Time    01/26/14  5:00 PM      Result Value Ref Range Status   Specimen Description BLOOD ARM LEFT   Final   Special  Requests BOTTLES DRAWN AEROBIC AND ANAEROBIC 10CC   Final   Culture  Setup Time     Final   Value: 01/26/2014 23:08     Performed at [facility]   Culture     Final   Value:        BLOOD CULTURE RECEIVED NO GROWTH TO DATE CULTURE WILL BE HELD FOR 5 DAYS BEFORE ISSUING A FINAL NEGATIVE REPORT     Performed at [facility]   Report Status PENDING   Incomplete    Medical History: Past Medical History  Diagnosis Date  . HIV disease   . Cirrhosis   . PTSD (post-traumatic stress disorder)   . Depressive disorder   . Cancer   . Lymphoma   . Anemia     Assessment: 58 YOF with hx of HIV currently being treated with Tressie Ellis and daptomycin at [facility] for discitis. He has worsening lower back pain, and LE pain and weakness.   Disc aspirate planned today by IR Scr stable Next CK level 8/29  Plan:  - Continue Daptomycin 700 (8mg /kg) daily - Continue Fortaz 2 grams iv Q 8 hours - Continue to follow  Thank you. Anette Guarneri, PharmD [phone]   01/31/2014,10:30 AM

## 2014-01-31 NOTE — Progress Notes (Signed)
Patient ID: Samuel Schultz, male   DOB: 1975/08/17, 38 y.o.   MRN: [id number] Aspiration of disc space results pending. 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

## 2014-01-31 NOTE — Progress Notes (Signed)
CSW Armed forces technical officer) spoke with pt and updated that at this time CSW has no bed offers. CSW has contacted [facility] and they will need to review clinicals before they can confirm pt can return. CSW also spoke with pt about alternate facility in [place] that [facility] has contact information for. Pt was agreeable to CSW sending referral and potentially having a liaison from facility stop by.  [place], [place]

## 2014-01-31 NOTE — Procedures (Signed)
FLuoro guided R L4 paraspinal/psoas 16g aspiration Fluoro guided L5/S1 disc 16g aspiration ~90ml bloodly fluid from each site, send separately for GS, C&S, AFB, fungal No complication No blood loss. See complete dictation in [facility].

## 2014-02-01 DIAGNOSIS — C8589 Other specified types of non-Hodgkin lymphoma, extranodal and solid organ sites: Secondary | ICD-10-CM

## 2014-02-01 LAB — BASIC METABOLIC PANEL
ANION GAP: 8 (ref 5–15)
BUN: 5 mg/dL — ABNORMAL LOW (ref 6–23)
CHLORIDE: 98 meq/L (ref 96–112)
CO2: 27 meq/L (ref 19–32)
Calcium: 8.5 mg/dL (ref 8.4–10.5)
Creatinine, Ser: 0.5 mg/dL (ref 0.50–1.35)
GFR calc Af Amer: >90 mL/min (ref >90)
GFR calc non Af Amer: >90 mL/min (ref >90)
GLUCOSE: 117 mg/dL — AB (ref 70–99)
POTASSIUM: 4.2 meq/L (ref 3.7–5.3)
SODIUM: 133 meq/L — AB (ref 137–147)

## 2014-02-01 LAB — CBC
HCT: 22.9 % — ABNORMAL LOW (ref 39.0–52.0)
Hemoglobin: 7.7 g/dL — ABNORMAL LOW (ref 13.0–17.0)
MCH: 34.4 pg — ABNORMAL HIGH (ref 26.0–34.0)
MCHC: 33.6 g/dL (ref 30.0–36.0)
MCV: 102.2 fL — ABNORMAL HIGH (ref 78.0–100.0)
PLATELETS: 84 10*3/uL — AB (ref 150–400)
RBC: 2.24 MIL/uL — AB (ref 4.22–5.81)
RDW: 18 % — ABNORMAL HIGH (ref 11.5–15.5)
WBC: 1.9 10*3/uL — AB (ref 4.0–10.5)

## 2014-02-01 LAB — CULTURE, BLOOD (ROUTINE X 2)
CULTURE: NO GROWTH
Culture: NO GROWTH

## 2014-02-01 LAB — MRSA PCR SCREENING: MRSA BY PCR: NEGATIVE

## 2014-02-01 MED ORDER — DEXTROSE 5 % IV SOLN
2.0000 g | Freq: Two times a day (BID) | INTRAVENOUS | Status: DC
  Administered 2014-02-01 – 2014-02-05 (×8): 2 g via INTRAVENOUS
  Filled 2014-02-01 (×9): qty 2

## 2014-02-01 MED ORDER — MORPHINE SULFATE ER 60 MG PO TBCR
60.0000 mg | EXTENDED_RELEASE_TABLET | Freq: Two times a day (BID) | ORAL | Status: DC
  Administered 2014-02-01 – 2014-02-05 (×9): 60 mg via ORAL
  Filled 2014-02-01 (×9): qty 1

## 2014-02-01 MED ORDER — FLUCONAZOLE 200 MG PO TABS
400.0000 mg | ORAL_TABLET | Freq: Every day | ORAL | Status: DC
  Administered 2014-02-02 – 2014-02-05 (×4): 400 mg via ORAL
  Filled 2014-02-01 (×4): qty 2

## 2014-02-01 NOTE — Progress Notes (Signed)
[facility]  Date of Admission:  01/26/2014  Antibiotics: daptomycin Ceftazidime Fluconazole DRV/r [place] Weekly azithromycin PCP prophylaxis  Subjective: No new complaints  Objective: Temp:  [98.3 F (36.8 C)-99.5 F (37.5 C)] 98.3 F (36.8 C) (08/27 1148) Pulse Rate:  [87-95] 95 (08/27 1148) Resp:  [17-18] 18 (08/27 1148) BP: (94-107)/(53-60) 106/55 mmHg (08/27 1148) SpO2:  [91 %-97 %] 95 % (08/27 1148)  General: Awake, alert Skin: no rashes Lungs: CTA B Cor: RRR Abdomen: soft, nt Ext: no edema  Lab Results Lab Results  Component Value Date   WBC 1.9* 02/01/2014   HGB 7.7* 02/01/2014   HCT 22.9* 02/01/2014   MCV 102.2* 02/01/2014   PLT 84* 02/01/2014    Lab Results  Component Value Date   CREATININE 0.50 02/01/2014   BUN 5* 02/01/2014   NA 133* 02/01/2014   K 4.2 02/01/2014   CL 98 02/01/2014   CO2 27 02/01/2014    Lab Results  Component Value Date   ALT 7 01/27/2014   AST 24 01/27/2014   ALKPHOS 68 01/27/2014   BILITOT 2.3* 01/27/2014      Microbiology: Recent Results (from the past 240 hour(s))  CULTURE, BLOOD (ROUTINE X 2)     Status: None   Collection Time    01/26/14  4:49 PM      Result Value Ref Range Status   Specimen Description BLOOD RIGHT PICC LINE   Final   Special Requests BOTTLES DRAWN AEROBIC AND ANAEROBIC 10CC   Final   Culture  Setup Time     Final   Value: 01/26/2014 23:10     Performed at [facility]   Culture     Final   Value: NO GROWTH 5 DAYS     Performed at [facility]   Report Status 02/01/2014 FINAL   Final  CULTURE, BLOOD (ROUTINE X 2)     Status: None   Collection Time    01/26/14  5:00 PM      Result Value Ref Range Status   Specimen Description BLOOD ARM LEFT   Final   Special Requests BOTTLES DRAWN AEROBIC AND ANAEROBIC 10CC   Final   Culture  Setup Time     Final   Value: 01/26/2014 23:08     Performed at [facility]   Culture     Final   Value: NO GROWTH 5  DAYS     Performed at [facility]   Report Status 02/01/2014 FINAL   Final  CULTURE, ROUTINE-ABSCESS     Status: None   Collection Time    01/31/14 12:14 PM      Result Value Ref Range Status   Specimen Description ABSCESS RIGHT   Final   Special Requests L4   Final   Gram Stain     Final   Value: FEW WBC PRESENT,BOTH PMN AND MONONUCLEAR     NO SQUAMOUS EPITHELIAL CELLS SEEN     NO ORGANISMS SEEN     Performed at [facility]   Culture     Final   Value: NO GROWTH 1 DAY     Performed at [facility]   Report Status PENDING   Incomplete  FUNGUS CULTURE W SMEAR     Status: None   Collection Time    01/31/14 12:14 PM      Result Value Ref Range Status   Specimen Description ABSCESS RIGHT   Final  Special Requests L4   Final   Fungal Smear     Final   Value: NO YEAST OR FUNGAL ELEMENTS SEEN     Performed at [facility]   Culture     Final   Value: CULTURE IN PROGRESS FOR FOUR WEEKS     Performed at [facility]   Report Status PENDING   Incomplete  AFB CULTURE WITH SMEAR     Status: None   Collection Time    01/31/14 12:14 PM      Result Value Ref Range Status   Specimen Description ABSCESS RIGHT   Final   Special Requests L4   Final   Acid Fast Smear     Final   Value: NO ACID FAST BACILLI SEEN     Performed at [facility]   Culture     Final   Value: CULTURE WILL BE EXAMINED FOR 6 WEEKS BEFORE ISSUING A FINAL REPORT     Performed at [facility]   Report Status PENDING   Incomplete  CULTURE, ROUTINE-ABSCESS     Status: None   Collection Time    01/31/14 12:15 PM      Result Value Ref Range Status   Specimen Description ABSCESS   Final   Special Requests L5 S1 DISC SPACE   Final   Gram Stain     Final   Value: FEW WBC PRESENT,BOTH PMN AND MONONUCLEAR     NO SQUAMOUS EPITHELIAL CELLS SEEN     NO ORGANISMS SEEN     Performed at [facility]   Culture     Final   Value: NO GROWTH 1 DAY     Performed  at [facility]   Report Status PENDING   Incomplete  FUNGUS CULTURE W SMEAR     Status: None   Collection Time    01/31/14 12:15 PM      Result Value Ref Range Status   Specimen Description ABSCESS   Final   Special Requests L5 S1 DISC SPACE   Final   Fungal Smear     Final   Value: NO YEAST OR FUNGAL ELEMENTS SEEN     Performed at [facility]   Culture     Final   Value: CULTURE IN PROGRESS FOR FOUR WEEKS     Performed at [facility]   Report Status PENDING   Incomplete  AFB CULTURE WITH SMEAR     Status: None   Collection Time    01/31/14 12:15 PM      Result Value Ref Range Status   Specimen Description ABSCESS   Final   Special Requests L5 S1 DISC SPACE   Final   Acid Fast Smear     Final   Value: NO ACID FAST BACILLI SEEN     Performed at [facility]   Culture     Final   Value: CULTURE WILL BE EXAMINED FOR 6 WEEKS BEFORE ISSUING A FINAL REPORT     Performed at [facility]   Report Status PENDING   Incomplete  MRSA PCR SCREENING     Status: None   Collection Time    01/31/14 10:52 PM      Result Value Ref Range Status   MRSA by PCR NEGATIVE  NEGATIVE Final   Comment:            The GeneXpert MRSA Assay (FDA     approved for NASAL specimens  only), is one component of a     comprehensive MRSA colonization     surveillance program. It is not     intended to diagnose MRSA     infection nor to guide or     monitor treatment for     MRSA infections.    Studies/Results: Ir Fluoro Guide Ndl Plmt / Bx  01/31/2014   CLINICAL DATA:  MR demonstrates progressive discitis at l3-4 and new discitis l5-s1 with adjacent epidural abscess. there is also a small fluid signal collection in the right paraspinal/ psoas region at the l4 level which was not evident on prior study.  EXAM: IR FLUORO GUIDE NEEDLE PLACEMENT /BIOPSY x2  MEDICATIONS: Intravenous Fentanyl and Versed were administered as conscious sedation during continuous  cardiorespiratory monitoring by the radiology RN, with a total moderate sedation time of 30 minutes.  PROCEDURE: The procedure, risks (including but not limited to bleeding, infection, organ damage ), benefits, and alternatives were explained to the patient. Questions regarding the procedure were encouraged and answered. The patient understands and consents to the procedure.  Patient placed prone. Lumbar region prepped and draped in usual sterile fashion. Maximal barrier sterile technique was utilized including caps, mask, sterile gowns, sterile gloves, sterile drape, hand hygiene and skin antiseptic. Under fluoroscopic guidance, a 16 gauge trocar needle was advanced to the right paraspinal/psoas collection at the L4 level using bony landmarks. Approximately 2 mL of bloody fluid could be aspirated. The needle was removed.  In similar fashion, using new sterile equipment, a 16 gauge trocar needle was advanced into the L5-S1 interspace from a right posterolateral approach under fluoroscopy. Needle tip position within the central third of the disc was confirmed on AP and lateral imaging. Approximately 2 mL of bloody fluid were aspirated. The needle was removed.  Both aspirates were sent separately for Gram stain, culture and sensitivity, AFB if, and fungal culture.  The patient tolerated the procedure well.  Complications: None immediate  FINDINGS: Aspiration of the right paraspinal/psoas collection at L4 returned only a scant amount of bloody fluid/ debris, sent for culture as above.  Aspiration of the L5-S1 disc returned only a scant amount of bloody fluid/ debris, sent for culture as above.  IMPRESSION: 1. Technically successful aspiration of right paraspinal/psoas abscess. 2. Technically successful aspiration of L5-S1 disc under fluoroscopy.   Electronically Signed   By: Arne Cleveland M.D.   On: 01/31/2014 14:46   Ir Fluoro Guide Ndl Plmt / Bx  01/31/2014   CLINICAL DATA:  MR demonstrates progressive discitis  at l3-4 and new discitis l5-s1 with adjacent epidural abscess. there is also a small fluid signal collection in the right paraspinal/ psoas region at the l4 level which was not evident on prior study.  EXAM: IR FLUORO GUIDE NEEDLE PLACEMENT /BIOPSY x2  MEDICATIONS: Intravenous Fentanyl and Versed were administered as conscious sedation during continuous cardiorespiratory monitoring by the radiology RN, with a total moderate sedation time of 30 minutes.  PROCEDURE: The procedure, risks (including but not limited to bleeding, infection, organ damage ), benefits, and alternatives were explained to the patient. Questions regarding the procedure were encouraged and answered. The patient understands and consents to the procedure.  Patient placed prone. Lumbar region prepped and draped in usual sterile fashion. Maximal barrier sterile technique was utilized including caps, mask, sterile gowns, sterile gloves, sterile drape, hand hygiene and skin antiseptic. Under fluoroscopic guidance, a 16 gauge trocar needle was advanced to the right paraspinal/psoas collection at the L4 level  using bony landmarks. Approximately 2 mL of bloody fluid could be aspirated. The needle was removed.  In similar fashion, using new sterile equipment, a 16 gauge trocar needle was advanced into the L5-S1 interspace from a right posterolateral approach under fluoroscopy. Needle tip position within the central third of the disc was confirmed on AP and lateral imaging. Approximately 2 mL of bloody fluid were aspirated. The needle was removed.  Both aspirates were sent separately for Gram stain, culture and sensitivity, AFB if, and fungal culture.  The patient tolerated the procedure well.  Complications: None immediate  FINDINGS: Aspiration of the right paraspinal/psoas collection at L4 returned only a scant amount of bloody fluid/ debris, sent for culture as above.  Aspiration of the L5-S1 disc returned only a scant amount of bloody fluid/ debris,  sent for culture as above.  IMPRESSION: 1. Technically successful aspiration of right paraspinal/psoas abscess. 2. Technically successful aspiration of L5-S1 disc under fluoroscopy.   Electronically Signed   By: Arne Cleveland M.D.   On: 01/31/2014 14:46    Assessment/Plan: 1) Discitis - s/p aspiration.  Dr. Saintclair Halsted feels he is a poor candidate for any surgery.  No positive cultures to date of aspirations. 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   -will continue daptomycin -change ceftazidime to cefepime -continue with fluconazole 400 mg po daily -continue for at least 8 weeks   2) HIV - continue with ARVs, OI prophylaxis  Samuel Schultz, Samuel Schultz, [facility] [place] [email or web address] [phone] pager   [phone] cell 02/01/2014, 4:43 PM

## 2014-02-01 NOTE — Progress Notes (Signed)
PROGRESS NOTE    Samuel Schultz AVW:[id number] DOB: 02-Jun-1976 DOA: 01/26/2014 PCP: [name], MD/infectious disease M.D. in [place], [place] Oncology: [facility].  HPI/Brief narrative 38 year old male hospitalized in June with polymicrobial bacteremia felt to be related to colitis. His blood cultures were positive for vancomycin and ampicillin sensitive enterococcus, vancomycin resistant enterococcus, and Candida albicans. He received a little over 2 weeks of therapy with daptomycin and fluconazole, completing therapy in late June. He was readmitted on July 5 with severe, acute back pain and was found to have L3-4 diskitis & osteomyelitis. He had lumbar aspirations on July 6 and July 10. Gram stain and culture were negative on both specimens. He was started on daptomycin and ceftazidime on July 10 and discharged to a skilled nursing facility. He presented with worsening back pain. MRI shows possible L5-S1 discitis with epidural abscess. Neurosurgery does not see acute surgical need. Infectious disease following and recommend aspiration of disc space area for cultures-scheduled for 8/26 by IR.   Assessment/Plan:  1. 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 Infectious disease following. Continue IV Daptomycin, Ceftazidime & IV fluconazole added. Back pain better controlled. Blood Cx's negative to date. Since not responding to broad spectrum Abx, on 8/24 ID recommended aspiration for Cx (Bacterial, AFB & Fungal Cx) to appropriately Rx. He was taken to radiology on 01/31/2014 where he underwent fluoro guided aspiration. Pending cultures. On 02/01/2014 ID recommending changing Ceftaz to Cefepime, continue Fluconazole 400 mg PO q daily, continue Daptomycin. Recommending 8 weeks of treatment.  2. Pancytopenia: DD-lymphoma, HIV and meds versus other etiology. Anemia panel - ? Chronic disease ? Iron deficiency. S/p  1 unit PRBC on 8/22 for  hemoglobin 6.9. Improved to 7.6. No reported bleeding. Follow. Started Iron supplements. WBC and Platelets slightly better. 3. History of lymphoma: Apparently treated with chemotherapy in 2008 and missed for followup appointment at [facility] in 2014. Check peripheral smear- pending. Will need outpatient followup with oncology. May not be a candidate for any chemotherapy at this time was ongoing acute infection. 4. HIV/AIDS: Continue retroviral therapy. ID following 5. Pain Management. Patient reporting pain no under control, will increase MS Contin to 60 mg OP BID   Code Status: Full Family Communication: None at bedside Disposition Plan: SNF when medically stable.   Consultants:  Infectious disease  Neurosurgery  IR  Procedures:  None  Antibiotics:  IV daptomycin 8/22 >  IV ceftazidime 8/22 >  IV fluconazole/22 >   Subjective: He complains of ongoing back pain with radiation down right lower extremity  Objective: Filed Vitals:   02/01/14 0403 02/01/14 0805 02/01/14 1148 02/01/14 1653  BP: 107/60 104/58 106/55 106/64  Pulse: 89 91 95 89  Temp: 99.1 F (37.3 C) 99.5 F (37.5 C) 98.3 F (36.8 C) 98.6 F (37 C)  TempSrc: Oral Oral Oral Oral  Resp:  17 18 16   Height:      Weight:      SpO2: 96% 94% 95% 93%    Intake/Output Summary (Last 24 hours) at 02/01/14 1702 Last data filed at 02/01/14 0845  Gross per 24 hour  Intake    120 ml  Output    800 ml  Net   -680 ml   Filed Weights   01/27/14 0616 01/28/14 0539 01/30/14 0616  Weight: 82.7 kg (182 lb 5.1 oz) 82.736 kg (182 lb 6.4 oz) 83.144 kg (183 lb 4.8 oz)  Exam:  General exam: Pleasant young male lying in bed without distress. Respiratory system: Clear. No increased work of breathing. Cardiovascular system: S1 & S2 heard, RRR. No JVD, murmurs, gallops, clicks or pedal edema. Gastrointestinal system: Abdomen is nondistended, soft and nontender. Normal bowel sounds heard. Central nervous system: Alert  and oriented. No focal neurological deficits. Extremities: Symmetric 5 x 5 power. Limited evaluation of right lower extremity secondary to pain.   Data Reviewed: Basic Metabolic Panel:  Recent Labs Lab 01/26/14 1648 01/27/14 0647 01/31/14 0505 02/01/14 0420  NA 133* 135* 132* 133*  K 3.9 3.9 4.4 4.2  CL 101 105 100 98  CO2 21 22 26 27   GLUCOSE 91 98 81 117*  BUN 7 8 5* 5*  CREATININE 0.55 0.52 0.47* 0.50  CALCIUM 8.3* 8.3* 8.5 8.5   Liver Function Tests:  Recent Labs Lab 01/26/14 1648 01/27/14 0647  AST 25 24  ALT 8 7  ALKPHOS 78 68  BILITOT 3.0* 2.3*  PROT 7.4 6.6  ALBUMIN 2.1* 1.9*   No results found for this basename: LIPASE, AMYLASE,  in the last 168 hours No results found for this basename: AMMONIA,  in the last 168 hours CBC:  Recent Labs Lab 01/26/14 1648 01/27/14 0647  01/28/14 0430 01/29/14 0400 01/30/14 0422 01/31/14 0505 02/01/14 0420  WBC 2.3* 1.3*  < > 2.3* 2.0* 1.6* 1.7* 1.9*  NEUTROABS 1.4* 0.8*  --   --   --   --   --   --   HGB 7.8* 6.9*  < > 7.6* 7.6* 7.5* 7.5* 7.7*  HCT 22.9* 19.9*  < > 22.5* 22.6* 22.3* 22.5* 22.9*  MCV 103.6* 103.1*  < > 100.0 102.3* 101.8* 100.4* 102.2*  PLT 94* 83*  < > 89* 82* 76* 83* 84*  < > = values in this interval not displayed. Cardiac Enzymes:  Recent Labs Lab 01/27/14 0647  CKTOTAL 56   BNP (last 3 results) No results found for this basename: PROBNP,  in the last 8760 hours CBG: No results found for this basename: GLUCAP,  in the last 168 hours  Recent Results (from the past 240 hour(s))  CULTURE, BLOOD (ROUTINE X 2)     Status: None   Collection Time    01/26/14  4:49 PM      Result Value Ref Range Status   Specimen Description BLOOD RIGHT PICC LINE   Final   Special Requests BOTTLES DRAWN AEROBIC AND ANAEROBIC 10CC   Final   Culture  Setup Time     Final   Value: 01/26/2014 23:10     Performed at [facility]   Culture     Final   Value: NO GROWTH 5 DAYS     Performed at [facility]   Report Status 02/01/2014 FINAL   Final  CULTURE, BLOOD (ROUTINE X 2)     Status: None   Collection Time    01/26/14  5:00 PM      Result Value Ref Range Status   Specimen Description BLOOD ARM LEFT   Final   Special Requests BOTTLES DRAWN AEROBIC AND ANAEROBIC 10CC   Final   Culture  Setup Time     Final   Value: 01/26/2014 23:08     Performed at [facility]   Culture     Final   Value: NO GROWTH 5 DAYS     Performed at [facility]   Report Status 02/01/2014 FINAL   Final  CULTURE, ROUTINE-ABSCESS     Status: None   Collection Time    01/31/14 12:14 PM      Result Value Ref Range Status   Specimen Description ABSCESS RIGHT   Final   Special Requests L4   Final   Gram Stain     Final   Value: FEW WBC PRESENT,BOTH PMN AND MONONUCLEAR     NO SQUAMOUS EPITHELIAL CELLS SEEN     NO ORGANISMS SEEN     Performed at [facility]   Culture     Final   Value: NO GROWTH 1 DAY     Performed at [facility]   Report Status PENDING   Incomplete  FUNGUS CULTURE W SMEAR     Status: None   Collection Time    01/31/14 12:14 PM      Result Value Ref Range Status   Specimen Description ABSCESS RIGHT   Final   Special Requests L4   Final   Fungal Smear     Final   Value: NO YEAST OR FUNGAL ELEMENTS SEEN     Performed at [facility]   Culture     Final   Value: CULTURE IN PROGRESS FOR FOUR WEEKS     Performed at [facility]   Report Status PENDING   Incomplete  AFB CULTURE WITH SMEAR     Status: None   Collection Time    01/31/14 12:14 PM      Result Value Ref Range Status   Specimen Description ABSCESS RIGHT   Final   Special Requests L4   Final   Acid Fast Smear     Final   Value: NO ACID FAST BACILLI SEEN     Performed at [facility]   Culture     Final   Value: CULTURE WILL BE EXAMINED FOR 6 WEEKS BEFORE ISSUING A FINAL REPORT     Performed at [facility]   Report Status PENDING   Incomplete   CULTURE, ROUTINE-ABSCESS     Status: None   Collection Time    01/31/14 12:15 PM      Result Value Ref Range Status   Specimen Description ABSCESS   Final   Special Requests L5 S1 DISC SPACE   Final   Gram Stain     Final   Value: FEW WBC PRESENT,BOTH PMN AND MONONUCLEAR     NO SQUAMOUS EPITHELIAL CELLS SEEN     NO ORGANISMS SEEN     Performed at [facility]   Culture     Final   Value: NO GROWTH 1 DAY     Performed at [facility]   Report Status PENDING   Incomplete  FUNGUS CULTURE W SMEAR     Status: None   Collection Time    01/31/14 12:15 PM      Result Value Ref Range Status   Specimen Description ABSCESS   Final   Special Requests L5 S1 DISC SPACE   Final   Fungal Smear     Final   Value: NO YEAST OR FUNGAL ELEMENTS SEEN     Performed at [facility]   Culture     Final   Value: CULTURE IN PROGRESS FOR FOUR WEEKS     Performed at [facility]   Report Status PENDING   Incomplete  AFB CULTURE WITH SMEAR     Status: None   Collection Time    01/31/14 12:15 PM  Result Value Ref Range Status   Specimen Description ABSCESS   Final   Special Requests L5 S1 DISC SPACE   Final   Acid Fast Smear     Final   Value: NO ACID FAST BACILLI SEEN     Performed at [facility]   Culture     Final   Value: CULTURE WILL BE EXAMINED FOR 6 WEEKS BEFORE ISSUING A FINAL REPORT     Performed at [facility]   Report Status PENDING   Incomplete  MRSA PCR SCREENING     Status: None   Collection Time    01/31/14 10:52 PM      Result Value Ref Range Status   MRSA by PCR NEGATIVE  NEGATIVE Final   Comment:            The GeneXpert MRSA Assay (FDA     approved for NASAL specimens     only), is one component of a     comprehensive MRSA colonization     surveillance program. It is not     intended to diagnose MRSA     infection nor to guide or     monitor treatment for     MRSA infections.       Studies: Ir Fluoro Guide Ndl  Plmt / Bx  01/31/2014   CLINICAL DATA:  MR demonstrates progressive discitis at l3-4 and new discitis l5-s1 with adjacent epidural abscess. there is also a small fluid signal collection in the right paraspinal/ psoas region at the l4 level which was not evident on prior study.  EXAM: IR FLUORO GUIDE NEEDLE PLACEMENT /BIOPSY x2  MEDICATIONS: Intravenous Fentanyl and Versed were administered as conscious sedation during continuous cardiorespiratory monitoring by the radiology RN, with a total moderate sedation time of 30 minutes.  PROCEDURE: The procedure, risks (including but not limited to bleeding, infection, organ damage ), benefits, and alternatives were explained to the patient. Questions regarding the procedure were encouraged and answered. The patient understands and consents to the procedure.  Patient placed prone. Lumbar region prepped and draped in usual sterile fashion. Maximal barrier sterile technique was utilized including caps, mask, sterile gowns, sterile gloves, sterile drape, hand hygiene and skin antiseptic. Under fluoroscopic guidance, a 16 gauge trocar needle was advanced to the right paraspinal/psoas collection at the L4 level using bony landmarks. Approximately 2 mL of bloody fluid could be aspirated. The needle was removed.  In similar fashion, using new sterile equipment, a 16 gauge trocar needle was advanced into the L5-S1 interspace from a right posterolateral approach under fluoroscopy. Needle tip position within the central third of the disc was confirmed on AP and lateral imaging. Approximately 2 mL of bloody fluid were aspirated. The needle was removed.  Both aspirates were sent separately for Gram stain, culture and sensitivity, AFB if, and fungal culture.  The patient tolerated the procedure well.  Complications: None immediate  FINDINGS: Aspiration of the right paraspinal/psoas collection at L4 returned only a scant amount of bloody fluid/ debris, sent for culture as above.   Aspiration of the L5-S1 disc returned only a scant amount of bloody fluid/ debris, sent for culture as above.  IMPRESSION: 1. Technically successful aspiration of right paraspinal/psoas abscess. 2. Technically successful aspiration of L5-S1 disc under fluoroscopy.   Electronically Signed   By: Arne Cleveland M.D.   On: 01/31/2014 14:46   Ir Fluoro Guide Ndl Plmt / Bx  01/31/2014   CLINICAL DATA:  MR demonstrates progressive  discitis at l3-4 and new discitis l5-s1 with adjacent epidural abscess. there is also a small fluid signal collection in the right paraspinal/ psoas region at the l4 level which was not evident on prior study.  EXAM: IR FLUORO GUIDE NEEDLE PLACEMENT /BIOPSY x2  MEDICATIONS: Intravenous Fentanyl and Versed were administered as conscious sedation during continuous cardiorespiratory monitoring by the radiology RN, with a total moderate sedation time of 30 minutes.  PROCEDURE: The procedure, risks (including but not limited to bleeding, infection, organ damage ), benefits, and alternatives were explained to the patient. Questions regarding the procedure were encouraged and answered. The patient understands and consents to the procedure.  Patient placed prone. Lumbar region prepped and draped in usual sterile fashion. Maximal barrier sterile technique was utilized including caps, mask, sterile gowns, sterile gloves, sterile drape, hand hygiene and skin antiseptic. Under fluoroscopic guidance, a 16 gauge trocar needle was advanced to the right paraspinal/psoas collection at the L4 level using bony landmarks. Approximately 2 mL of bloody fluid could be aspirated. The needle was removed.  In similar fashion, using new sterile equipment, a 16 gauge trocar needle was advanced into the L5-S1 interspace from a right posterolateral approach under fluoroscopy. Needle tip position within the central third of the disc was confirmed on AP and lateral imaging. Approximately 2 mL of bloody fluid were aspirated.  The needle was removed.  Both aspirates were sent separately for Gram stain, culture and sensitivity, AFB if, and fungal culture.  The patient tolerated the procedure well.  Complications: None immediate  FINDINGS: Aspiration of the right paraspinal/psoas collection at L4 returned only a scant amount of bloody fluid/ debris, sent for culture as above.  Aspiration of the L5-S1 disc returned only a scant amount of bloody fluid/ debris, sent for culture as above.  IMPRESSION: 1. Technically successful aspiration of right paraspinal/psoas abscess. 2. Technically successful aspiration of L5-S1 disc under fluoroscopy.   Electronically Signed   By: Arne Cleveland M.D.   On: 01/31/2014 14:46        Scheduled Meds: . sodium chloride   Intravenous Once  . atovaquone  750 mg Oral Daily  . azithromycin  1,200 mg Oral Weekly  . buPROPion  300 mg Oral Daily  . ceFEPime (MAXIPIME) IV  2 g Intravenous Q12H  . cyclobenzaprine  5 mg Oral QHS  . DAPTOmycin (CUBICIN)  IV  700 mg Intravenous Q24H  . Darunavir Ethanolate  800 mg Oral Q breakfast  . docusate sodium  100 mg Oral BID  . emtricitabine-tenofovir  1 tablet Oral QHS  . ferrous sulfate  325 mg Oral BID WC  . [START ON 02/02/2014] fluconazole  400 mg Oral Daily  . gabapentin  400 mg Oral TID  . morphine  60 mg Oral Q12H  . multivitamin with minerals  1 tablet Oral Daily  . pantoprazole  40 mg Oral Daily  . potassium chloride SA  20 mEq Oral Daily  . ritonavir  100 mg Oral Q breakfast  . sodium chloride  3 mL Intravenous Q12H  . sucralfate  1 g Oral TID WC   Continuous Infusions:   Principal Problem:   Abscess in epidural space of lumbar spine Active Problems:   AIDS   Large cell lymphoma   Cirrhosis, non-alcoholic   Pancytopenia   Epidural abscess    Time spent: 25 minutes.    Kelvin Cellar, MD, FACP, FHM. [facility] Pager [phone]  If 7PM-7AM, please contact night-coverage [email or web address] Password [facility] 02/01/2014, 5:02  PM    LOS: 6 days

## 2014-02-01 NOTE — Progress Notes (Signed)
ANTIBIOTIC CONSULT NOTE - INITIAL  Pharmacy Consult for Daptomycin/cefepime Indication: Epidural abscess, osteomyelitis  Allergies  Allergen Reactions  . Codeine Anaphylaxis, Hives, Nausea And Vomiting, Nausea Only and Swelling  . Oxycodone Other (See Comments)    Other reaction(s): Dizziness Lightheaded, nausea    Labs:  Recent Labs  01/30/14 0422 01/31/14 0505 02/01/14 0420  WBC 1.6* 1.7* 1.9*  HGB 7.5* 7.5* 7.7*  PLT 76* 83* 84*  CREATININE  --  0.47* 0.50     Microbiology: Recent Results (from the past 720 hour(s))  CULTURE, BLOOD (ROUTINE X 2)     Status: None   Collection Time    01/05/14  7:10 PM      Result Value Ref Range Status   Specimen Description BLOOD LEFT ANTECUBITAL   Final   Special Requests BOTTLES DRAWN AEROBIC AND ANAEROBIC 6CC   Final   Culture NO GROWTH 5 DAYS   Final   Report Status 01/10/2014 FINAL   Final  CULTURE, BLOOD (ROUTINE X 2)     Status: None   Collection Time    01/05/14  7:38 PM      Result Value Ref Range Status   Specimen Description BLOOD LEFT ANTECUBITAL   Final   Special Requests BOTTLES DRAWN AEROBIC AND ANAEROBIC [place]   Final   Culture NO GROWTH 5 DAYS   Final   Report Status 01/10/2014 FINAL   Final  URINE CULTURE     Status: None   Collection Time    01/05/14  7:56 PM      Result Value Ref Range Status   Specimen Description URINE, CLEAN CATCH   Final   Special Requests Immunocompromised   Final   Culture  Setup Time     Final   Value: 01/06/2014 19:39     Performed at [place]     Final   Value: NO GROWTH     Performed at [facility]   Culture     Final   Value: NO GROWTH     Performed at [facility]   Report Status 01/07/2014 FINAL   Final  CULTURE, BLOOD (ROUTINE X 2)     Status: None   Collection Time    01/26/14  4:49 PM      Result Value Ref Range Status   Specimen Description BLOOD RIGHT PICC LINE   Final   Special Requests BOTTLES DRAWN AEROBIC AND  ANAEROBIC 10CC   Final   Culture  Setup Time     Final   Value: 01/26/2014 23:10     Performed at [facility]   Culture     Final   Value: NO GROWTH 5 DAYS     Performed at [facility]   Report Status 02/01/2014 FINAL   Final  CULTURE, BLOOD (ROUTINE X 2)     Status: None   Collection Time    01/26/14  5:00 PM      Result Value Ref Range Status   Specimen Description BLOOD ARM LEFT   Final   Special Requests BOTTLES DRAWN AEROBIC AND ANAEROBIC 10CC   Final   Culture  Setup Time     Final   Value: 01/26/2014 23:08     Performed at [facility]   Culture     Final   Value: NO GROWTH 5 DAYS     Performed at [facility]   Report Status 02/01/2014 FINAL   Final  CULTURE, ROUTINE-ABSCESS     Status: None   Collection Time    01/31/14 12:14 PM      Result Value Ref Range Status   Specimen Description ABSCESS RIGHT   Final   Special Requests L4   Final   Gram Stain     Final   Value: FEW WBC PRESENT,BOTH PMN AND MONONUCLEAR     NO SQUAMOUS EPITHELIAL CELLS SEEN     NO ORGANISMS SEEN     Performed at [facility]   Culture     Final   Value: NO GROWTH 1 DAY     Performed at [facility]   Report Status PENDING   Incomplete  FUNGUS CULTURE W SMEAR     Status: None   Collection Time    01/31/14 12:14 PM      Result Value Ref Range Status   Specimen Description ABSCESS RIGHT   Final   Special Requests L4   Final   Fungal Smear     Final   Value: NO YEAST OR FUNGAL ELEMENTS SEEN     Performed at [facility]   Culture     Final   Value: CULTURE IN PROGRESS FOR FOUR WEEKS     Performed at [facility]   Report Status PENDING   Incomplete  AFB CULTURE WITH SMEAR     Status: None   Collection Time    01/31/14 12:14 PM      Result Value Ref Range Status   Specimen Description ABSCESS RIGHT   Final   Special Requests L4   Final   Acid Fast Smear     Final   Value: NO ACID FAST BACILLI SEEN     Performed at  [facility]   Culture     Final   Value: CULTURE WILL BE EXAMINED FOR 6 WEEKS BEFORE ISSUING A FINAL REPORT     Performed at [facility]   Report Status PENDING   Incomplete  CULTURE, ROUTINE-ABSCESS     Status: None   Collection Time    01/31/14 12:15 PM      Result Value Ref Range Status   Specimen Description ABSCESS   Final   Special Requests L5 S1 DISC SPACE   Final   Gram Stain     Final   Value: FEW WBC PRESENT,BOTH PMN AND MONONUCLEAR     NO SQUAMOUS EPITHELIAL CELLS SEEN     NO ORGANISMS SEEN     Performed at [facility]   Culture     Final   Value: NO GROWTH 1 DAY     Performed at [facility]   Report Status PENDING   Incomplete  FUNGUS CULTURE W SMEAR     Status: None   Collection Time    01/31/14 12:15 PM      Result Value Ref Range Status   Specimen Description ABSCESS   Final   Special Requests L5 S1 DISC SPACE   Final   Fungal Smear     Final   Value: NO YEAST OR FUNGAL ELEMENTS SEEN     Performed at [facility]   Culture     Final   Value: CULTURE IN PROGRESS FOR FOUR WEEKS     Performed at [facility]   Report Status PENDING   Incomplete  AFB CULTURE WITH SMEAR     Status: None   Collection Time    01/31/14 12:15 PM  Result Value Ref Range Status   Specimen Description ABSCESS   Final   Special Requests L5 S1 DISC SPACE   Final   Acid Fast Smear     Final   Value: NO ACID FAST BACILLI SEEN     Performed at [facility]   Culture     Final   Value: CULTURE WILL BE EXAMINED FOR 6 WEEKS BEFORE ISSUING A FINAL REPORT     Performed at [facility]   Report Status PENDING   Incomplete  MRSA PCR SCREENING     Status: None   Collection Time    01/31/14 10:52 PM      Result Value Ref Range Status   MRSA by PCR NEGATIVE  NEGATIVE Final   Comment:            The GeneXpert MRSA Assay (FDA     approved for NASAL specimens     only), is one component of a     comprehensive MRSA  colonization     surveillance program. It is not     intended to diagnose MRSA     infection nor to guide or     monitor treatment for     MRSA infections.    Medical History: Past Medical History  Diagnosis Date  . HIV disease   . Cirrhosis   . PTSD (post-traumatic stress disorder)   . Depressive disorder   . Cancer   . Lymphoma   . Anemia     Assessment: 16 YOF with hx of HIV currently being treated with Tressie Ellis and daptomycin at [facility] for discitis. He has worsening lower back pain, and LE pain and weakness. Changing to cefepime now instead of fortaz.   Plan:   Cefepime 2g VI q12  Onnie Boer, PharmD Pager: [phone] 02/01/2014 4:59 PM

## 2014-02-01 NOTE — Progress Notes (Signed)
CSW Armed forces technical officer) spoke with pt and updated on challenges of finding new placement. Pt upset and CSW offered support and understanding. Per RNCM pt could potentially be ready for dc tomorrow. CSW called [facility] to notify.  [place], [place]

## 2014-02-02 DIAGNOSIS — E43 Unspecified severe protein-calorie malnutrition: Secondary | ICD-10-CM

## 2014-02-02 LAB — CK: CK TOTAL: 107 U/L (ref 7–232)

## 2014-02-02 MED ORDER — SENNOSIDES-DOCUSATE SODIUM 8.6-50 MG PO TABS
2.0000 | ORAL_TABLET | Freq: Every day | ORAL | Status: DC
  Administered 2014-02-02 – 2014-02-04 (×3): 2 via ORAL
  Filled 2014-02-02 (×4): qty 2

## 2014-02-02 NOTE — Progress Notes (Signed)
CSW (Clinical Education officer, museum) notified [facility] of pt dc today. CSW made aware that they do not have any beds available today and will inform CSW when they do. CSW spoke with Shaun with [facility] ([phone]) who is awaiting morning meetings to see if the [facility] facility has any long term male beds available. Alverda Skeans is also checking with [facility] facility. CSW updated pt on this information as well as pt friend Rosaria Ferries.  [place], [place]

## 2014-02-02 NOTE — Progress Notes (Signed)
Chaplain responded to spiritual care consult. Patient experiencing stressful transition. He identified his primary concern, saying he doesn't want to return to the facility he was living in. He was distracted by texting and phone calls. Chaplain listened empathically to his concerns, providing emotional support, pastoral presence, and prayer. Please page for follow up.   Samuel Schultz [phone]

## 2014-02-02 NOTE — Progress Notes (Signed)
Physical Therapy Treatment Patient Details Name: Samuel Schultz MRN: [id number] DOB: Jun 30, 1975 Today's Date: 02/02/2014    History of Present Illness Pt is a 38 y.o. male with history of HIV/AIDS, lymphoma, nonalcoholic cirrhosis, coagulopathy, pancytopenia presented to the ER because of worsening back pain. Patient states that he has been having pain in his low back last few days it has been radiating to his right leg. Denies any fever chills. Patient was recently admitted for discitis and is on IV daptomycin and ceftriaxone. MRI L-spine shows epidural abscess L5-S1 area. On-call neurosurgeon Dr. Saintclair Halsted was consulted and Dr. Saintclair Halsted at this time feels patient does not have any cauda equina features.    PT Comments    Pt with increased ambulation and activity tolerance with details below. Pt denied OOB to chair, HEP or any further activity. Pt with decreased safety with ambulation and reports falling at all hospitals he has been too, question if events are possibly intentional given pt sequence with gait today. Will follow to maximize independence.  Follow Up Recommendations  SNF;Supervision/Assistance - 24 hour     Equipment Recommendations       Recommendations for Other Services       Precautions / Restrictions Precautions Precautions: Fall Precaution Comments: educated on back precautions for safety and decreased pain Restrictions Weight Bearing Restrictions: No    Mobility  Bed Mobility   Bed Mobility: Sit to Sidelying         Sit to sidelying: Modified independent (Device/Increase time)    Transfers Overall transfer level: Needs assistance     Sit to Stand: Min guard         General transfer comment: cues for hand placement and posture as pt trying to pull up on RW and impulsive with sitting  Ambulation/Gait Ambulation/Gait assistance: Min assist Ambulation Distance (Feet): 250 Feet Assistive device: Rolling walker (2 wheeled) Gait Pattern/deviations:  Step-through pattern;Decreased stride length;Trunk flexed;Narrow base of support   Gait velocity interpretation: Below normal speed for age/gender General Gait Details: Pt maintaining self posterior to walker despite cues and education for positioning in RW as well as cues for PWB to decrease weight on RLE but pt would not follow commands for [facility] because he stated standing up made his right hip hurt worse. Pt cued to turn around at 45' but pt insistent on wakling to nursing desk. On return to room grossly 15' before room pt plopped onto right forearm on RW stating he was too fatigued and couldn't walk. Pt mod assist to control balance and prevent fall. Grossly 30 sec later pt able to stand with mod cues and min assist and ambulate complete distance to room with same pattern and ease as leaving room.    Stairs            Wheelchair Mobility    Modified Rankin (Stroke Patients Only)       Balance                                    Cognition Arousal/Alertness: Awake/alert Behavior During Therapy: Flat affect Overall Cognitive Status: Impaired/Different from baseline Area of Impairment: Safety/judgement;Following commands       Following Commands: Follows one step commands inconsistently Safety/Judgement: Decreased awareness of safety          Exercises      General Comments        Pertinent Vitals/Pain Pain Score: 7  Pain Location:  right hip and thigh Pain Descriptors / Indicators: Aching Pain Intervention(s): Premedicated before session;Repositioned    Home Living                      Prior Function            PT Goals (current goals can now be found in the care plan section) Progress towards PT goals: Progressing toward goals (slowly)    Frequency  Min 2X/week    PT Plan Current plan remains appropriate    Co-evaluation             End of Session Equipment Utilized During Treatment: Gait belt Activity Tolerance: Patient  limited by pain Patient left: in bed;with call bell/phone within Schultz     Time: 1103-1120 PT Time Calculation (min): 17 min  Charges:  $Gait Training: 8-22 mins                    G Codes:      Samuel Schultz 02/11/2014, 11:37 AM Samuel Schultz, Samuel Schultz

## 2014-02-02 NOTE — Progress Notes (Signed)
Pt refused all 10am meds.  Pt stated he only wanted to take his pain pills.  I educated him on the importance of taking all medications and what they were used for, but pt still declined.  Will continue to monitor.

## 2014-02-02 NOTE — Progress Notes (Signed)
PROGRESS NOTE    Samuel Schultz NLZ:[id number] DOB: 05/23/1976 DOA: 01/26/2014 PCP: [name], MD/infectious disease M.D. in [place], [place] Oncology: [facility].  HPI/Brief narrative 38 year old male hospitalized in June with polymicrobial bacteremia felt to be related to colitis. His blood cultures were positive for vancomycin and ampicillin sensitive enterococcus, vancomycin resistant enterococcus, and Candida albicans. He received a little over 2 weeks of therapy with daptomycin and fluconazole, completing therapy in late June. He was readmitted on July 5 with severe, acute back pain and was found to have L3-4 diskitis & osteomyelitis. He had lumbar aspirations on July 6 and July 10. Gram stain and culture were negative on both specimens. He was started on daptomycin and ceftazidime on July 10 and discharged to a skilled nursing facility. He presented with worsening back pain. MRI shows possible L5-S1 discitis with epidural abscess. Neurosurgery does not see acute surgical need. Infectious disease following and recommend aspiration of disc space area for cultures-scheduled for 8/26 by IR.   Assessment/Plan:  1. 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 Infectious disease following. Continue IV Daptomycin, Ceftazidime & IV fluconazole added. Back pain better controlled. Blood Cx's negative to date. Since not responding to broad spectrum Abx, on 8/24 ID recommended aspiration for Cx (Bacterial, AFB & Fungal Cx) to appropriately Rx. He was taken to radiology on 01/31/2014 where he underwent fluoro guided aspiration. Pending cultures. On 02/01/2014 ID recommending changing Ceftaz to Cefepime, continue Fluconazole 400 mg PO q daily, continue Daptomycin. Recommending 8 weeks of treatment.  2. Pancytopenia: DD-lymphoma, HIV and meds versus other etiology. Anemia panel - ? Chronic disease ? Iron deficiency. S/p  1 unit PRBC on 8/22 for  hemoglobin 6.9. Improved to 7.6. No reported bleeding. Follow. Started Iron supplements. WBC and Platelets slightly better. 3. History of lymphoma: Apparently treated with chemotherapy in 2008 and missed for followup appointment at [facility] in 2014. Check peripheral smear- pending. Will need outpatient followup with oncology. May not be a candidate for any chemotherapy at this time was ongoing acute infection. 4. HIV/AIDS: Continue retroviral therapy. ID following 5. Pain Management. Patient reporting pain better controlled with increase MS Contin to 60 mg OP BID   Code Status: Full Family Communication: None at bedside Disposition Plan: Patient stating not wanting to return to previous SNF. Attempts have been made to send referrals elsewhere, however no bed offers have been made. Case discussed with social work   Consultants:  Infectious disease  Neurosurgery  IR  Procedures:  None  Antibiotics:  IV daptomycin 8/22 >  IV ceftazidime 8/22 >  IV fluconazole/22 >   Subjective: He reports improvement to pain since increasing her MS Contin from 30-60 mg twice a day. Patient is upset about the possibility of returning to his skilled nursing facility. Social work following.  Objective: Filed Vitals:   02/01/14 1653 02/01/14 2000 02/02/14 0443 02/02/14 0750  BP: 106/64 107/51 112/60 121/66  Pulse: 89 85 93 94  Temp: 98.6 F (37 C) 98.4 F (36.9 C) 98.6 F (37 C) 99.9 F (37.7 C)  TempSrc: Oral Oral Oral Oral  Resp: 16 16  17   Height:      Weight:   83.099 kg (183 lb 3.2 oz)   SpO2: 93% 91% 94% 93%    Intake/Output Summary (Last 24 hours) at 02/02/14 1208 Last data filed at 02/02/14 0506  Gross per 24 hour  Intake 2574.83 ml  Output  400 ml  Net 2174.83 ml   Filed Weights   01/28/14 0539 01/30/14 0616 02/02/14 0443  Weight: 82.736 kg (182 lb 6.4 oz) 83.144 kg (183 lb 4.8 oz) 83.099 kg (183 lb 3.2 oz)     Exam:  General exam: He is visibly upset Respiratory  system: Clear. No increased work of breathing. Cardiovascular system: S1 & S2 heard, RRR. No JVD, murmurs, gallops, clicks or pedal edema. Gastrointestinal system: Abdomen is nondistended, soft and nontender. Normal bowel sounds heard. Central nervous system: Alert and oriented. No focal neurological deficits. Extremities: Symmetric 5 x 5 power. Limited evaluation of right lower extremity secondary to pain.   Data Reviewed: Basic Metabolic Panel:  Recent Labs Lab 01/26/14 1648 01/27/14 0647 01/31/14 0505 02/01/14 0420  NA 133* 135* 132* 133*  K 3.9 3.9 4.4 4.2  CL 101 105 100 98  CO2 21 22 26 27   GLUCOSE 91 98 81 117*  BUN 7 8 5* 5*  CREATININE 0.55 0.52 0.47* 0.50  CALCIUM 8.3* 8.3* 8.5 8.5   Liver Function Tests:  Recent Labs Lab 01/26/14 1648 01/27/14 0647  AST 25 24  ALT 8 7  ALKPHOS 78 68  BILITOT 3.0* 2.3*  PROT 7.4 6.6  ALBUMIN 2.1* 1.9*   No results found for this basename: LIPASE, AMYLASE,  in the last 168 hours No results found for this basename: AMMONIA,  in the last 168 hours CBC:  Recent Labs Lab 01/26/14 1648 01/27/14 0647  01/28/14 0430 01/29/14 0400 01/30/14 0422 01/31/14 0505 02/01/14 0420  WBC 2.3* 1.3*  < > 2.3* 2.0* 1.6* 1.7* 1.9*  NEUTROABS 1.4* 0.8*  --   --   --   --   --   --   HGB 7.8* 6.9*  < > 7.6* 7.6* 7.5* 7.5* 7.7*  HCT 22.9* 19.9*  < > 22.5* 22.6* 22.3* 22.5* 22.9*  MCV 103.6* 103.1*  < > 100.0 102.3* 101.8* 100.4* 102.2*  PLT 94* 83*  < > 89* 82* 76* 83* 84*  < > = values in this interval not displayed. Cardiac Enzymes:  Recent Labs Lab 01/27/14 0647 02/02/14 0500  CKTOTAL 56 107   BNP (last 3 results) No results found for this basename: PROBNP,  in the last 8760 hours CBG: No results found for this basename: GLUCAP,  in the last 168 hours  Recent Results (from the past 240 hour(s))  CULTURE, BLOOD (ROUTINE X 2)     Status: None   Collection Time    01/26/14  4:49 PM      Result Value Ref Range Status    Specimen Description BLOOD RIGHT PICC LINE   Final   Special Requests BOTTLES DRAWN AEROBIC AND ANAEROBIC 10CC   Final   Culture  Setup Time     Final   Value: 01/26/2014 23:10     Performed at [facility]   Culture     Final   Value: NO GROWTH 5 DAYS     Performed at [facility]   Report Status 02/01/2014 FINAL   Final  CULTURE, BLOOD (ROUTINE X 2)     Status: None   Collection Time    01/26/14  5:00 PM      Result Value Ref Range Status   Specimen Description BLOOD ARM LEFT   Final   Special Requests BOTTLES DRAWN AEROBIC AND ANAEROBIC 10CC   Final   Culture  Setup Time     Final   Value: 01/26/2014 23:08  Performed at [facility]     Final   Value: NO GROWTH 5 DAYS     Performed at [facility]   Report Status 02/01/2014 FINAL   Final  CULTURE, ROUTINE-ABSCESS     Status: None   Collection Time    01/31/14 12:14 PM      Result Value Ref Range Status   Specimen Description ABSCESS RIGHT   Final   Special Requests L4   Final   Gram Stain     Final   Value: FEW WBC PRESENT,BOTH PMN AND MONONUCLEAR     NO SQUAMOUS EPITHELIAL CELLS SEEN     NO ORGANISMS SEEN     Performed at [facility]   Culture     Final   Value: NO GROWTH 2 DAYS     Performed at [facility]   Report Status PENDING   Incomplete  FUNGUS CULTURE W SMEAR     Status: None   Collection Time    01/31/14 12:14 PM      Result Value Ref Range Status   Specimen Description ABSCESS RIGHT   Final   Special Requests L4   Final   Fungal Smear     Final   Value: NO YEAST OR FUNGAL ELEMENTS SEEN     Performed at [facility]   Culture     Final   Value: CULTURE IN PROGRESS FOR FOUR WEEKS     Performed at [facility]   Report Status PENDING   Incomplete  AFB CULTURE WITH SMEAR     Status: None   Collection Time    01/31/14 12:14 PM      Result Value Ref Range Status   Specimen Description ABSCESS RIGHT   Final   Special Requests  L4   Final   Acid Fast Smear     Final   Value: NO ACID FAST BACILLI SEEN     Performed at [facility]   Culture     Final   Value: CULTURE WILL BE EXAMINED FOR 6 WEEKS BEFORE ISSUING A FINAL REPORT     Performed at [facility]   Report Status PENDING   Incomplete  CULTURE, ROUTINE-ABSCESS     Status: None   Collection Time    01/31/14 12:15 PM      Result Value Ref Range Status   Specimen Description ABSCESS   Final   Special Requests L5 S1 DISC SPACE   Final   Gram Stain     Final   Value: FEW WBC PRESENT,BOTH PMN AND MONONUCLEAR     NO SQUAMOUS EPITHELIAL CELLS SEEN     NO ORGANISMS SEEN     Performed at [facility]   Culture     Final   Value: NO GROWTH 2 DAYS     Performed at [facility]   Report Status PENDING   Incomplete  FUNGUS CULTURE W SMEAR     Status: None   Collection Time    01/31/14 12:15 PM      Result Value Ref Range Status   Specimen Description ABSCESS   Final   Special Requests L5 S1 DISC SPACE   Final   Fungal Smear     Final   Value: NO YEAST OR FUNGAL ELEMENTS SEEN     Performed at [facility]   Culture     Final   Value: CULTURE IN PROGRESS FOR FOUR  WEEKS     Performed at [facility]   Report Status PENDING   Incomplete  AFB CULTURE WITH SMEAR     Status: None   Collection Time    01/31/14 12:15 PM      Result Value Ref Range Status   Specimen Description ABSCESS   Final   Special Requests L5 S1 DISC SPACE   Final   Acid Fast Smear     Final   Value: NO ACID FAST BACILLI SEEN     Performed at [facility]   Culture     Final   Value: CULTURE WILL BE EXAMINED FOR 6 WEEKS BEFORE ISSUING A FINAL REPORT     Performed at [facility]   Report Status PENDING   Incomplete  MRSA PCR SCREENING     Status: None   Collection Time    01/31/14 10:52 PM      Result Value Ref Range Status   MRSA by PCR NEGATIVE  NEGATIVE Final   Comment:            The GeneXpert MRSA Assay (FDA      approved for NASAL specimens     only), is one component of a     comprehensive MRSA colonization     surveillance program. It is not     intended to diagnose MRSA     infection nor to guide or     monitor treatment for     MRSA infections.       Studies: Ir Fluoro Guide Ndl Plmt / Bx  01/31/2014   CLINICAL DATA:  MR demonstrates progressive discitis at l3-4 and new discitis l5-s1 with adjacent epidural abscess. there is also a small fluid signal collection in the right paraspinal/ psoas region at the l4 level which was not evident on prior study.  EXAM: IR FLUORO GUIDE NEEDLE PLACEMENT /BIOPSY x2  MEDICATIONS: Intravenous Fentanyl and Versed were administered as conscious sedation during continuous cardiorespiratory monitoring by the radiology RN, with a total moderate sedation time of 30 minutes.  PROCEDURE: The procedure, risks (including but not limited to bleeding, infection, organ damage ), benefits, and alternatives were explained to the patient. Questions regarding the procedure were encouraged and answered. The patient understands and consents to the procedure.  Patient placed prone. Lumbar region prepped and draped in usual sterile fashion. Maximal barrier sterile technique was utilized including caps, mask, sterile gowns, sterile gloves, sterile drape, hand hygiene and skin antiseptic. Under fluoroscopic guidance, a 16 gauge trocar needle was advanced to the right paraspinal/psoas collection at the L4 level using bony landmarks. Approximately 2 mL of bloody fluid could be aspirated. The needle was removed.  In similar fashion, using new sterile equipment, a 16 gauge trocar needle was advanced into the L5-S1 interspace from a right posterolateral approach under fluoroscopy. Needle tip position within the central third of the disc was confirmed on AP and lateral imaging. Approximately 2 mL of bloody fluid were aspirated. The needle was removed.  Both aspirates were sent separately for Gram  stain, culture and sensitivity, AFB if, and fungal culture.  The patient tolerated the procedure well.  Complications: None immediate  FINDINGS: Aspiration of the right paraspinal/psoas collection at L4 returned only a scant amount of bloody fluid/ debris, sent for culture as above.  Aspiration of the L5-S1 disc returned only a scant amount of bloody fluid/ debris, sent for culture as above.  IMPRESSION: 1. Technically successful aspiration of right paraspinal/psoas abscess.  2. Technically successful aspiration of L5-S1 disc under fluoroscopy.   Electronically Signed   By: Arne Cleveland M.D.   On: 01/31/2014 14:46   Ir Fluoro Guide Ndl Plmt / Bx  01/31/2014   CLINICAL DATA:  MR demonstrates progressive discitis at l3-4 and new discitis l5-s1 with adjacent epidural abscess. there is also a small fluid signal collection in the right paraspinal/ psoas region at the l4 level which was not evident on prior study.  EXAM: IR FLUORO GUIDE NEEDLE PLACEMENT /BIOPSY x2  MEDICATIONS: Intravenous Fentanyl and Versed were administered as conscious sedation during continuous cardiorespiratory monitoring by the radiology RN, with a total moderate sedation time of 30 minutes.  PROCEDURE: The procedure, risks (including but not limited to bleeding, infection, organ damage ), benefits, and alternatives were explained to the patient. Questions regarding the procedure were encouraged and answered. The patient understands and consents to the procedure.  Patient placed prone. Lumbar region prepped and draped in usual sterile fashion. Maximal barrier sterile technique was utilized including caps, mask, sterile gowns, sterile gloves, sterile drape, hand hygiene and skin antiseptic. Under fluoroscopic guidance, a 16 gauge trocar needle was advanced to the right paraspinal/psoas collection at the L4 level using bony landmarks. Approximately 2 mL of bloody fluid could be aspirated. The needle was removed.  In similar fashion, using new  sterile equipment, a 16 gauge trocar needle was advanced into the L5-S1 interspace from a right posterolateral approach under fluoroscopy. Needle tip position within the central third of the disc was confirmed on AP and lateral imaging. Approximately 2 mL of bloody fluid were aspirated. The needle was removed.  Both aspirates were sent separately for Gram stain, culture and sensitivity, AFB if, and fungal culture.  The patient tolerated the procedure well.  Complications: None immediate  FINDINGS: Aspiration of the right paraspinal/psoas collection at L4 returned only a scant amount of bloody fluid/ debris, sent for culture as above.  Aspiration of the L5-S1 disc returned only a scant amount of bloody fluid/ debris, sent for culture as above.  IMPRESSION: 1. Technically successful aspiration of right paraspinal/psoas abscess. 2. Technically successful aspiration of L5-S1 disc under fluoroscopy.   Electronically Signed   By: Arne Cleveland M.D.   On: 01/31/2014 14:46        Scheduled Meds: . sodium chloride   Intravenous Once  . atovaquone  750 mg Oral Daily  . azithromycin  1,200 mg Oral Weekly  . buPROPion  300 mg Oral Daily  . ceFEPime (MAXIPIME) IV  2 g Intravenous Q12H  . cyclobenzaprine  5 mg Oral QHS  . DAPTOmycin (CUBICIN)  IV  700 mg Intravenous Q24H  . Darunavir Ethanolate  800 mg Oral Q breakfast  . docusate sodium  100 mg Oral BID  . emtricitabine-tenofovir  1 tablet Oral QHS  . ferrous sulfate  325 mg Oral BID WC  . fluconazole  400 mg Oral Daily  . gabapentin  400 mg Oral TID  . morphine  60 mg Oral Q12H  . multivitamin with minerals  1 tablet Oral Daily  . pantoprazole  40 mg Oral Daily  . potassium chloride SA  20 mEq Oral Daily  . ritonavir  100 mg Oral Q breakfast  . senna-docusate  2 tablet Oral QHS  . sodium chloride  3 mL Intravenous Q12H  . sucralfate  1 g Oral TID WC   Continuous Infusions:   Principal Problem:   Abscess in epidural space of lumbar  spine Active Problems:  AIDS   Large cell lymphoma   Cirrhosis, non-alcoholic   Pancytopenia   Epidural abscess    Time spent: 25 minutes.    Kelvin Cellar, MD, FACP, FHM. [facility] Pager [phone]  If 7PM-7AM, please contact night-coverage [email or web address] Password [email or web address] 02/02/2014, 12:08 PM    LOS: 7 days

## 2014-02-02 NOTE — Care Management Note (Signed)
    Page 1 of 1   02/02/2014     2:47:49 PM CARE MANAGEMENT NOTE 02/02/2014  Patient:  Samuel Schultz, Samuel Schultz   Account Number:  [id number]  Date Initiated:  02/02/2014  Documentation initiated by:  GRAVES-BIGELOW,Juan Olthoff  Subjective/Objective Assessment:   Pt admitted from [facility] because of worsening back pain. Pt on IV antibiotic therapy.     Action/Plan:   CSW assisting with disposition needs. CM continuing to monitor.   Anticipated DC Date:  02/03/2014   Anticipated DC Plan:  SKILLED NURSING FACILITY  In-house referral  Clinical Social Worker      DC Planning Services  CM consult      Choice offered to / List presented to:             Status of service:  Completed, signed off Medicare Important Message given?  NO (If response is "NO", the following Medicare IM given date fields will be blank) Date Medicare IM given:   Medicare IM given by:   Date Additional Medicare IM given:   Additional Medicare IM given by:    Discharge Disposition:  [place]  Per UR Regulation:  Reviewed for med. necessity/level of care/duration of stay  If discussed at [place] of Stay Meetings, dates discussed:    Comments:

## 2014-02-02 NOTE — Progress Notes (Signed)
[place] for Infectious Disease  Date of Admission:  01/26/2014  Antibiotics: daptomycin Cefepime Fluconazole DRV/r Truvada Weekly azithromycin PCP prophylaxis  Subjective: No new complaints  Objective: Temp:  [98.4 F (36.9 C)-99.9 F (37.7 C)] 99.9 F (37.7 C) (08/28 0750) Pulse Rate:  [85-94] 94 (08/28 0750) Resp:  [16-17] 17 (08/28 0750) BP: (106-121)/(51-66) 121/66 mmHg (08/28 0750) SpO2:  [91 %-94 %] 93 % (08/28 0750) Weight:  [183 lb 3.2 oz (83.099 kg)] 183 lb 3.2 oz (83.099 kg) (08/28 0443)  General: Awake, alert Skin: no rashes Lungs: CTA B Cor: RRR Abdomen: soft, nt Ext: no edema  Lab Results Lab Results  Component Value Date   WBC 1.9* 02/01/2014   HGB 7.7* 02/01/2014   HCT 22.9* 02/01/2014   MCV 102.2* 02/01/2014   PLT 84* 02/01/2014    Lab Results  Component Value Date   CREATININE 0.50 02/01/2014   BUN 5* 02/01/2014   NA 133* 02/01/2014   K 4.2 02/01/2014   CL 98 02/01/2014   CO2 27 02/01/2014    Lab Results  Component Value Date   ALT 7 01/27/2014   AST 24 01/27/2014   ALKPHOS 68 01/27/2014   BILITOT 2.3* 01/27/2014      Microbiology: Recent Results (from the past 240 hour(s))  CULTURE, BLOOD (ROUTINE X 2)     Status: None   Collection Time    01/26/14  4:49 PM      Result Value Ref Range Status   Specimen Description BLOOD RIGHT PICC LINE   Final   Special Requests BOTTLES DRAWN AEROBIC AND ANAEROBIC 10CC   Final   Culture  Setup Time     Final   Value: 01/26/2014 23:10     Performed at [facility]   Culture     Final   Value: NO GROWTH 5 DAYS     Performed at [facility]   Report Status 02/01/2014 FINAL   Final  CULTURE, BLOOD (ROUTINE X 2)     Status: None   Collection Time    01/26/14  5:00 PM      Result Value Ref Range Status   Specimen Description BLOOD ARM LEFT   Final   Special Requests BOTTLES DRAWN AEROBIC AND ANAEROBIC 10CC   Final   Culture  Setup Time     Final   Value: 01/26/2014 23:08   Performed at [facility]   Culture     Final   Value: NO GROWTH 5 DAYS     Performed at [facility]   Report Status 02/01/2014 FINAL   Final  CULTURE, ROUTINE-ABSCESS     Status: None   Collection Time    01/31/14 12:14 PM      Result Value Ref Range Status   Specimen Description ABSCESS RIGHT   Final   Special Requests L4   Final   Gram Stain     Final   Value: FEW WBC PRESENT,BOTH PMN AND MONONUCLEAR     NO SQUAMOUS EPITHELIAL CELLS SEEN     NO ORGANISMS SEEN     Performed at [facility]   Culture     Final   Value: NO GROWTH 2 DAYS     Performed at [facility]   Report Status PENDING   Incomplete  FUNGUS CULTURE W SMEAR     Status: None   Collection Time    01/31/14 12:14 PM      Result Value Ref  Range Status   Specimen Description ABSCESS RIGHT   Final   Special Requests L4   Final   Fungal Smear     Final   Value: NO YEAST OR FUNGAL ELEMENTS SEEN     Performed at [facility]   Culture     Final   Value: CULTURE IN PROGRESS FOR FOUR WEEKS     Performed at [facility]   Report Status PENDING   Incomplete  AFB CULTURE WITH SMEAR     Status: None   Collection Time    01/31/14 12:14 PM      Result Value Ref Range Status   Specimen Description ABSCESS RIGHT   Final   Special Requests L4   Final   Acid Fast Smear     Final   Value: NO ACID FAST BACILLI SEEN     Performed at [facility]   Culture     Final   Value: CULTURE WILL BE EXAMINED FOR 6 WEEKS BEFORE ISSUING A FINAL REPORT     Performed at [facility]   Report Status PENDING   Incomplete  CULTURE, ROUTINE-ABSCESS     Status: None   Collection Time    01/31/14 12:15 PM      Result Value Ref Range Status   Specimen Description ABSCESS   Final   Special Requests L5 S1 DISC SPACE   Final   Gram Stain     Final   Value: FEW WBC PRESENT,BOTH PMN AND MONONUCLEAR     NO SQUAMOUS EPITHELIAL CELLS SEEN     NO ORGANISMS SEEN     Performed at  [facility]   Culture     Final   Value: NO GROWTH 2 DAYS     Performed at [facility]   Report Status PENDING   Incomplete  FUNGUS CULTURE W SMEAR     Status: None   Collection Time    01/31/14 12:15 PM      Result Value Ref Range Status   Specimen Description ABSCESS   Final   Special Requests L5 S1 DISC SPACE   Final   Fungal Smear     Final   Value: NO YEAST OR FUNGAL ELEMENTS SEEN     Performed at [facility]   Culture     Final   Value: CULTURE IN PROGRESS FOR FOUR WEEKS     Performed at [facility]   Report Status PENDING   Incomplete  AFB CULTURE WITH SMEAR     Status: None   Collection Time    01/31/14 12:15 PM      Result Value Ref Range Status   Specimen Description ABSCESS   Final   Special Requests L5 S1 DISC SPACE   Final   Acid Fast Smear     Final   Value: NO ACID FAST BACILLI SEEN     Performed at [facility]   Culture     Final   Value: CULTURE WILL BE EXAMINED FOR 6 WEEKS BEFORE ISSUING A FINAL REPORT     Performed at [facility]   Report Status PENDING   Incomplete  MRSA PCR SCREENING     Status: None   Collection Time    01/31/14 10:52 PM      Result Value Ref Range Status   MRSA by PCR NEGATIVE  NEGATIVE Final   Comment:  The GeneXpert MRSA Assay (FDA     approved for NASAL specimens     only), is one component of a     comprehensive MRSA colonization     surveillance program. It is not     intended to diagnose MRSA     infection nor to guide or     monitor treatment for     MRSA infections.    Studies/Results: No results found.  Assessment/Plan: 1) Discitis - s/p aspiration.  Dr. Saintclair Halsted feels he is a poor candidate for any surgery.  No positive cultures to date of aspirations. 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   -will continue daptomycin -continue with cefepime -continue with fluconazole 400 mg po daily -continue for at least 8 weeks and reassess  2) HIV - continue  with ARVs, OI prophylaxis  Dr. Tommy Medal available over the weekend if needed and will monitor cultures  Scharlene Gloss, [place] [facility] Infect[facility] [place] [email or web address] [phone] pager   [phone] cell 02/02/2014, 12:13 PM

## 2014-02-03 LAB — CULTURE, ROUTINE-ABSCESS
CULTURE: NO GROWTH
Culture: NO GROWTH

## 2014-02-03 NOTE — Progress Notes (Signed)
PROGRESS NOTE    Samuel Schultz EXB:[id number] DOB: Apr 14, 1976 DOA: 01/26/2014 PCP: [name], MD/infectious disease M.D. in [place], [place] Oncology: [facility].  HPI/Brief narrative 38 year old male hospitalized in June with polymicrobial bacteremia felt to be related to colitis. His blood cultures were positive for vancomycin and ampicillin sensitive enterococcus, vancomycin resistant enterococcus, and Candida albicans. He received a little over 2 weeks of therapy with daptomycin and fluconazole, completing therapy in late June. He was readmitted on July 5 with severe, acute back pain and was found to have L3-4 diskitis & osteomyelitis. He had lumbar aspirations on July 6 and July 10. Gram stain and culture were negative on both specimens. He was started on daptomycin and ceftazidime on July 10 and discharged to a skilled nursing facility. He presented with worsening back pain. MRI shows possible L5-S1 discitis with epidural abscess. Neurosurgery does not see acute surgical need. Infectious disease following and recommend aspiration of disc space area for cultures-scheduled for 8/26 by IR.   Assessment/Plan:  1. 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 Infectious disease following. Continue IV Daptomycin, Ceftazidime & IV fluconazole added. Back pain better controlled. Blood Cx's negative to date. Since not responding to broad spectrum Abx, on 8/24 ID recommended aspiration for Cx (Bacterial, AFB & Fungal Cx) to appropriately Rx. He was taken to radiology on 01/31/2014 where he underwent fluoro guided aspiration. Pending cultures. On 02/01/2014 ID recommending changing Ceftaz to Cefepime, continue Fluconazole 400 mg PO q daily, continue Daptomycin. Recommending 8 weeks of treatment.  2. Pancytopenia: DD-lymphoma, HIV and meds versus other etiology. Anemia panel - ? Chronic disease ? Iron deficiency. S/p  1 unit PRBC on 8/22 for  hemoglobin 6.9. Improved to 7.6. No reported bleeding. Follow. Started Iron supplements. WBC and Platelets slightly better. 3. History of lymphoma: Apparently treated with chemotherapy in 2008 and missed for followup appointment at [facility] in 2014. Check peripheral smear- pending. Will need outpatient followup with oncology. May not be a candidate for any chemotherapy at this time was ongoing acute infection. 4. HIV/AIDS: Continue retroviral therapy. ID following 5. Pain Management. Patient reporting pain better controlled with increase MS Contin to 60 mg OP BID   Code Status: Full Family Communication: None at bedside Disposition Plan: Patient stating not wanting to return to previous SNF. Attempts have been made to send referrals elsewhere, however no bed offers have been made. Case discussed with social work   Consultants:  Infectious disease  Neurosurgery  IR  Procedures:  None  Antibiotics:  IV daptomycin 8/22 >  IV ceftazidime 8/22 >  IV fluconazole/22 >   Subjective: He reports improvement to pain since increasing MS Contin from 30-60 mg twice a day. No acute events overngiht  Objective: Filed Vitals:   02/02/14 1100 02/02/14 1500 02/02/14 2005 02/03/14 0500  BP: 112/57 100/49 105/54 104/62  Pulse: 101 88 83 92  Temp: 99.1 F (37.3 C) 98.1 F (36.7 C) 98.1 F (36.7 C) 98.3 F (36.8 C)  TempSrc: Oral Oral Oral Oral  Resp: 20 18 16 18   Height:      Weight:    82.781 kg (182 lb 8 oz)  SpO2: 95% 98% 92% 96%    Intake/Output Summary (Last 24 hours) at 02/03/14 1347 Last data filed at 02/03/14 0830  Gross per 24 hour  Intake    440 ml  Output    700 ml  Net   -260  ml   Filed Weights   01/30/14 0616 02/02/14 0443 02/03/14 0500  Weight: 83.144 kg (183 lb 4.8 oz) 83.099 kg (183 lb 3.2 oz) 82.781 kg (182 lb 8 oz)     Exam:  General exam: He is visibly upset Respiratory system: Clear. No increased work of breathing. Cardiovascular system: S1 & S2 heard,  RRR. No JVD, murmurs, gallops, clicks or pedal edema. Gastrointestinal system: Abdomen is nondistended, soft and nontender. Normal bowel sounds heard. Central nervous system: Alert and oriented. No focal neurological deficits. Extremities: Symmetric 5 x 5 power. Limited evaluation of right lower extremity secondary to pain.   Data Reviewed: Basic Metabolic Panel:  Recent Labs Lab 01/31/14 0505 02/01/14 0420  NA 132* 133*  K 4.4 4.2  CL 100 98  CO2 26 27  GLUCOSE 81 117*  BUN 5* 5*  CREATININE 0.47* 0.50  CALCIUM 8.5 8.5   Liver Function Tests: No results found for this basename: AST, ALT, ALKPHOS, BILITOT, PROT, ALBUMIN,  in the last 168 hours No results found for this basename: LIPASE, AMYLASE,  in the last 168 hours No results found for this basename: AMMONIA,  in the last 168 hours CBC:  Recent Labs Lab 01/28/14 0430 01/29/14 0400 01/30/14 0422 01/31/14 0505 02/01/14 0420  WBC 2.3* 2.0* 1.6* 1.7* 1.9*  HGB 7.6* 7.6* 7.5* 7.5* 7.7*  HCT 22.5* 22.6* 22.3* 22.5* 22.9*  MCV 100.0 102.3* 101.8* 100.4* 102.2*  PLT 89* 82* 76* 83* 84*   Cardiac Enzymes:  Recent Labs Lab 02/02/14 0500  CKTOTAL 107   BNP (last 3 results) No results found for this basename: PROBNP,  in the last 8760 hours CBG: No results found for this basename: GLUCAP,  in the last 168 hours  Recent Results (from the past 240 hour(s))  CULTURE, BLOOD (ROUTINE X 2)     Status: None   Collection Time    01/26/14  4:49 PM      Result Value Ref Range Status   Specimen Description BLOOD RIGHT PICC LINE   Final   Special Requests BOTTLES DRAWN AEROBIC AND ANAEROBIC 10CC   Final   Culture  Setup Time     Final   Value: 01/26/2014 23:10     Performed at [facility]   Culture     Final   Value: NO GROWTH 5 DAYS     Performed at [facility]   Report Status 02/01/2014 FINAL   Final  CULTURE, BLOOD (ROUTINE X 2)     Status: None   Collection Time    01/26/14  5:00 PM      Result  Value Ref Range Status   Specimen Description BLOOD ARM LEFT   Final   Special Requests BOTTLES DRAWN AEROBIC AND ANAEROBIC 10CC   Final   Culture  Setup Time     Final   Value: 01/26/2014 23:08     Performed at [facility]   Culture     Final   Value: NO GROWTH 5 DAYS     Performed at [facility]   Report Status 02/01/2014 FINAL   Final  CULTURE, ROUTINE-ABSCESS     Status: None   Collection Time    01/31/14 12:14 PM      Result Value Ref Range Status   Specimen Description ABSCESS RIGHT   Final   Special Requests L4   Final   Gram Stain     Final   Value: FEW WBC PRESENT,BOTH PMN AND  MONONUCLEAR     NO SQUAMOUS EPITHELIAL CELLS SEEN     NO ORGANISMS SEEN     Performed at [facility]   Culture     Final   Value: NO GROWTH 3 DAYS     Performed at [facility]   Report Status 02/03/2014 FINAL   Final  FUNGUS CULTURE W SMEAR     Status: None   Collection Time    01/31/14 12:14 PM      Result Value Ref Range Status   Specimen Description ABSCESS RIGHT   Final   Special Requests L4   Final   Fungal Smear     Final   Value: NO YEAST OR FUNGAL ELEMENTS SEEN     Performed at [facility]   Culture     Final   Value: CULTURE IN PROGRESS FOR FOUR WEEKS     Performed at [facility]   Report Status PENDING   Incomplete  AFB CULTURE WITH SMEAR     Status: None   Collection Time    01/31/14 12:14 PM      Result Value Ref Range Status   Specimen Description ABSCESS RIGHT   Final   Special Requests L4   Final   Acid Fast Smear     Final   Value: NO ACID FAST BACILLI SEEN     Performed at [facility]   Culture     Final   Value: CULTURE WILL BE EXAMINED FOR 6 WEEKS BEFORE ISSUING A FINAL REPORT     Performed at [facility]   Report Status PENDING   Incomplete  CULTURE, ROUTINE-ABSCESS     Status: None   Collection Time    01/31/14 12:15 PM      Result Value Ref Range Status   Specimen Description ABSCESS    Final   Special Requests L5 S1 DISC SPACE   Final   Gram Stain     Final   Value: FEW WBC PRESENT,BOTH PMN AND MONONUCLEAR     NO SQUAMOUS EPITHELIAL CELLS SEEN     NO ORGANISMS SEEN     Performed at [facility]   Culture     Final   Value: NO GROWTH 3 DAYS     Performed at [facility]   Report Status 02/03/2014 FINAL   Final  FUNGUS CULTURE W SMEAR     Status: None   Collection Time    01/31/14 12:15 PM      Result Value Ref Range Status   Specimen Description ABSCESS   Final   Special Requests L5 S1 DISC SPACE   Final   Fungal Smear     Final   Value: NO YEAST OR FUNGAL ELEMENTS SEEN     Performed at [facility]   Culture     Final   Value: CULTURE IN PROGRESS FOR FOUR WEEKS     Performed at [facility]   Report Status PENDING   Incomplete  AFB CULTURE WITH SMEAR     Status: None   Collection Time    01/31/14 12:15 PM      Result Value Ref Range Status   Specimen Description ABSCESS   Final   Special Requests L5 S1 DISC SPACE   Final   Acid Fast Smear     Final   Value: NO ACID FAST BACILLI SEEN     Performed at [facility]  Final   Value: CULTURE WILL BE EXAMINED FOR 6 WEEKS BEFORE ISSUING A FINAL REPORT     Performed at [facility]   Report Status PENDING   Incomplete  MRSA PCR SCREENING     Status: None   Collection Time    01/31/14 10:52 PM      Result Value Ref Range Status   MRSA by PCR NEGATIVE  NEGATIVE Final   Comment:            The GeneXpert MRSA Assay (FDA     approved for NASAL specimens     only), is one component of a     comprehensive MRSA colonization     surveillance program. It is not     intended to diagnose MRSA     infection nor to guide or     monitor treatment for     MRSA infections.       Studies: No results found.      Scheduled Meds: . sodium chloride   Intravenous Once  . atovaquone  750 mg Oral Daily  . azithromycin  1,200 mg Oral Weekly  . buPROPion   300 mg Oral Daily  . ceFEPime (MAXIPIME) IV  2 g Intravenous Q12H  . cyclobenzaprine  5 mg Oral QHS  . DAPTOmycin (CUBICIN)  IV  700 mg Intravenous Q24H  . Darunavir Ethanolate  800 mg Oral Q breakfast  . docusate sodium  100 mg Oral BID  . emtricitabine-tenofovir  1 tablet Oral QHS  . ferrous sulfate  325 mg Oral BID WC  . fluconazole  400 mg Oral Daily  . gabapentin  400 mg Oral TID  . morphine  60 mg Oral Q12H  . multivitamin with minerals  1 tablet Oral Daily  . pantoprazole  40 mg Oral Daily  . potassium chloride SA  20 mEq Oral Daily  . ritonavir  100 mg Oral Q breakfast  . senna-docusate  2 tablet Oral QHS  . sodium chloride  3 mL Intravenous Q12H  . sucralfate  1 g Oral TID WC   Continuous Infusions:   Principal Problem:   Abscess in epidural space of lumbar spine Active Problems:   AIDS   Large cell lymphoma   Cirrhosis, non-alcoholic   Pancytopenia   Epidural abscess    Time spent: 20 minutes.    Kelvin Cellar, MD, FACP, FHM. [facility] Pager [phone]  If 7PM-7AM, please contact night-coverage [email or web address] Password [email or web address] 02/03/2014, 1:47 PM    LOS: 8 days

## 2014-02-04 NOTE — Progress Notes (Signed)
PROGRESS NOTE    Samuel Schultz BMW:[id number] DOB: [birth year] DOA: 01/26/2014 PCP: [name], MD/infectious disease M.D. in [place], [place] Oncology: [facility].  HPI/Brief narrative 38 year old male hospitalized in June with polymicrobial bacteremia felt to be related to colitis. His blood cultures were positive for vancomycin and ampicillin sensitive enterococcus, vancomycin resistant enterococcus, and Candida albicans. He received a little over 2 weeks of therapy with daptomycin and fluconazole, completing therapy in late June. He was readmitted on July 5 with severe, acute back pain and was found to have L3-4 diskitis & osteomyelitis. He had lumbar aspirations on July 6 and July 10. Gram stain and culture were negative on both specimens. He was started on daptomycin and ceftazidime on July 10 and discharged to a skilled nursing facility. He presented with worsening back pain. MRI shows possible L5-S1 discitis with epidural abscess. Neurosurgery does not see acute surgical need. Infectious disease following and recommend aspiration of disc space area for cultures-scheduled for 8/26 by IR.   Assessment/Plan:  1. 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 Infectious disease following. Continue IV Daptomycin, Ceftazidime & IV fluconazole added. Back pain better controlled. Blood Cx's negative to date. Since not responding to broad spectrum Abx, on 8/24 ID recommended aspiration for Cx (Bacterial, AFB & Fungal Cx) to appropriately Rx. He was taken to radiology on 01/31/2014 where he underwent fluoro guided aspiration. Pending cultures. On 02/01/2014 ID recommending changing Ceftaz to Cefepime, continue Fluconazole 400 mg PO q daily, continue Daptomycin. Recommending 8 weeks of treatment.  2. Pancytopenia: DD-lymphoma, HIV and meds versus other etiology. Anemia panel - ? Chronic disease ? Iron deficiency. S/p  1 unit PRBC on 8/22 for  hemoglobin 6.9. Improved to 7.6. No reported bleeding. Follow. Started Iron supplements. WBC and Platelets slightly better. 3. History of lymphoma: Apparently treated with chemotherapy in 2008 and missed for followup appointment at [facility] in 2014. Check peripheral smear- pending. Will need outpatient followup with oncology. May not be a candidate for any chemotherapy at this time was ongoing acute infection. 4. HIV/AIDS: Continue retroviral therapy. ID following 5. Pain Management. Patient reporting pain better controlled with increase MS Contin to 60 mg OP BID   Code Status: Full Family Communication: None at bedside Disposition Plan: Patient stating not wanting to return to previous SNF. Attempts have been made to send referrals elsewhere, however no bed offers have been made. Case discussed with social work. Awaiting placement   Consultants:  Infectious disease  Neurosurgery  IR  Procedures:  None  Antibiotics:  IV daptomycin 8/22 >  IV ceftazidime 8/22 >  IV fluconazole/22 >   Subjective: We ambulated him down the hallway to nurses station. Overall doing better, continues to have right hip pain.   Objective: Filed Vitals:   02/03/14 0500 02/03/14 1409 02/03/14 2100 02/04/14 0604  BP: 104/62 100/56 110/55 104/50  Pulse: 92 82 87 81  Temp: 98.3 F (36.8 C) 99 F (37.2 C) 98.2 F (36.8 C) 98.2 F (36.8 C)  TempSrc: Oral Oral    Resp: 18 16 16 15   Height:      Weight: 82.781 kg (182 lb 8 oz)     SpO2: 96% 95% 96% 96%    Intake/Output Summary (Last 24 hours) at 02/04/14 1138 Last data filed at 02/04/14 0927  Gross per 24 hour  Intake    614 ml  Output    700 ml  Net    -  86 ml   Filed Weights   01/30/14 0616 02/02/14 0443 02/03/14 0500  Weight: 83.144 kg (183 lb 4.8 oz) 83.099 kg (183 lb 3.2 oz) 82.781 kg (182 lb 8 oz)     Exam:  General exam: No acute distress, he was ambulated down the hallway.  Respiratory system: Clear. No increased work of  breathing. Cardiovascular system: S1 & S2 heard, RRR. No JVD, murmurs, gallops, clicks or pedal edema. Gastrointestinal system: Abdomen is nondistended, soft and nontender. Normal bowel sounds heard. Central nervous system: Alert and oriented. No focal neurological deficits. Extremities: Symmetric 5 x 5 power. Limited evaluation of right lower extremity secondary to pain.   Data Reviewed: Basic Metabolic Panel:  Recent Labs Lab 01/31/14 0505 02/01/14 0420  NA 132* 133*  K 4.4 4.2  CL 100 98  CO2 26 27  GLUCOSE 81 117*  BUN 5* 5*  CREATININE 0.47* 0.50  CALCIUM 8.5 8.5   Liver Function Tests: No results found for this basename: AST, ALT, ALKPHOS, BILITOT, PROT, ALBUMIN,  in the last 168 hours No results found for this basename: LIPASE, AMYLASE,  in the last 168 hours No results found for this basename: AMMONIA,  in the last 168 hours CBC:  Recent Labs Lab 01/29/14 0400 01/30/14 0422 01/31/14 0505 02/01/14 0420  WBC 2.0* 1.6* 1.7* 1.9*  HGB 7.6* 7.5* 7.5* 7.7*  HCT 22.6* 22.3* 22.5* 22.9*  MCV 102.3* 101.8* 100.4* 102.2*  PLT 82* 76* 83* 84*   Cardiac Enzymes:  Recent Labs Lab 02/02/14 0500  CKTOTAL 107   BNP (last 3 results) No results found for this basename: PROBNP,  in the last 8760 hours CBG: No results found for this basename: GLUCAP,  in the last 168 hours  Recent Results (from the past 240 hour(s))  CULTURE, BLOOD (ROUTINE X 2)     Status: None   Collection Time    01/26/14  4:49 PM      Result Value Ref Range Status   Specimen Description BLOOD RIGHT PICC LINE   Final   Special Requests BOTTLES DRAWN AEROBIC AND ANAEROBIC 10CC   Final   Culture  Setup Time     Final   Value: 01/26/2014 23:10     Performed at [facility]   Culture     Final   Value: NO GROWTH 5 DAYS     Performed at [facility]   Report Status 02/01/2014 FINAL   Final  CULTURE, BLOOD (ROUTINE X 2)     Status: None   Collection Time    01/26/14  5:00 PM       Result Value Ref Range Status   Specimen Description BLOOD ARM LEFT   Final   Special Requests BOTTLES DRAWN AEROBIC AND ANAEROBIC 10CC   Final   Culture  Setup Time     Final   Value: 01/26/2014 23:08     Performed at [facility]   Culture     Final   Value: NO GROWTH 5 DAYS     Performed at [facility]   Report Status 02/01/2014 FINAL   Final  CULTURE, ROUTINE-ABSCESS     Status: None   Collection Time    01/31/14 12:14 PM      Result Value Ref Range Status   Specimen Description ABSCESS RIGHT   Final   Special Requests L4   Final   Gram Stain     Final   Value: FEW WBC PRESENT,BOTH PMN AND  MONONUCLEAR     NO SQUAMOUS EPITHELIAL CELLS SEEN     NO ORGANISMS SEEN     Performed at [facility]   Culture     Final   Value: NO GROWTH 3 DAYS     Performed at [facility]   Report Status 02/03/2014 FINAL   Final  FUNGUS CULTURE W SMEAR     Status: None   Collection Time    01/31/14 12:14 PM      Result Value Ref Range Status   Specimen Description ABSCESS RIGHT   Final   Special Requests L4   Final   Fungal Smear     Final   Value: NO YEAST OR FUNGAL ELEMENTS SEEN     Performed at [facility]   Culture     Final   Value: CULTURE IN PROGRESS FOR FOUR WEEKS     Performed at [facility]   Report Status PENDING   Incomplete  AFB CULTURE WITH SMEAR     Status: None   Collection Time    01/31/14 12:14 PM      Result Value Ref Range Status   Specimen Description ABSCESS RIGHT   Final   Special Requests L4   Final   Acid Fast Smear     Final   Value: NO ACID FAST BACILLI SEEN     Performed at [facility]   Culture     Final   Value: CULTURE WILL BE EXAMINED FOR 6 WEEKS BEFORE ISSUING A FINAL REPORT     Performed at [facility]   Report Status PENDING   Incomplete  CULTURE, ROUTINE-ABSCESS     Status: None   Collection Time    01/31/14 12:15 PM      Result Value Ref Range Status   Specimen Description  ABSCESS   Final   Special Requests L5 S1 DISC SPACE   Final   Gram Stain     Final   Value: FEW WBC PRESENT,BOTH PMN AND MONONUCLEAR     NO SQUAMOUS EPITHELIAL CELLS SEEN     NO ORGANISMS SEEN     Performed at [facility]   Culture     Final   Value: NO GROWTH 3 DAYS     Performed at [facility]   Report Status 02/03/2014 FINAL   Final  FUNGUS CULTURE W SMEAR     Status: None   Collection Time    01/31/14 12:15 PM      Result Value Ref Range Status   Specimen Description ABSCESS   Final   Special Requests L5 S1 DISC SPACE   Final   Fungal Smear     Final   Value: NO YEAST OR FUNGAL ELEMENTS SEEN     Performed at [facility]   Culture     Final   Value: CULTURE IN PROGRESS FOR FOUR WEEKS     Performed at [facility]   Report Status PENDING   Incomplete  AFB CULTURE WITH SMEAR     Status: None   Collection Time    01/31/14 12:15 PM      Result Value Ref Range Status   Specimen Description ABSCESS   Final   Special Requests L5 S1 DISC SPACE   Final   Acid Fast Smear     Final   Value: NO ACID FAST BACILLI SEEN     Performed at [facility]  Final   Value: CULTURE WILL BE EXAMINED FOR 6 WEEKS BEFORE ISSUING A FINAL REPORT     Performed at [facility]   Report Status PENDING   Incomplete  MRSA PCR SCREENING     Status: None   Collection Time    01/31/14 10:52 PM      Result Value Ref Range Status   MRSA by PCR NEGATIVE  NEGATIVE Final   Comment:            The GeneXpert MRSA Assay (FDA     approved for NASAL specimens     only), is one component of a     comprehensive MRSA colonization     surveillance program. It is not     intended to diagnose MRSA     infection nor to guide or     monitor treatment for     MRSA infections.       Studies: No results found.      Scheduled Meds: . sodium chloride   Intravenous Once  . atovaquone  750 mg Oral Daily  . azithromycin  1,200 mg Oral Weekly  .  buPROPion  300 mg Oral Daily  . ceFEPime (MAXIPIME) IV  2 g Intravenous Q12H  . cyclobenzaprine  5 mg Oral QHS  . DAPTOmycin (CUBICIN)  IV  700 mg Intravenous Q24H  . Darunavir Ethanolate  800 mg Oral Q breakfast  . docusate sodium  100 mg Oral BID  . emtricitabine-tenofovir  1 tablet Oral QHS  . ferrous sulfate  325 mg Oral BID WC  . fluconazole  400 mg Oral Daily  . gabapentin  400 mg Oral TID  . morphine  60 mg Oral Q12H  . multivitamin with minerals  1 tablet Oral Daily  . pantoprazole  40 mg Oral Daily  . potassium chloride SA  20 mEq Oral Daily  . ritonavir  100 mg Oral Q breakfast  . senna-docusate  2 tablet Oral QHS  . sodium chloride  3 mL Intravenous Q12H  . sucralfate  1 g Oral TID WC   Continuous Infusions:   Principal Problem:   Abscess in epidural space of lumbar spine Active Problems:   AIDS   Large cell lymphoma   Cirrhosis, non-alcoholic   Pancytopenia   Epidural abscess    Time spent: 20 minutes.    Kelvin Cellar, MD, FACP, FHM. [facility] Pager [phone]  If 7PM-7AM, please contact night-coverage [email or web address] Password [email or web address] 02/04/2014, 11:38 AM    LOS: 9 days

## 2014-02-04 NOTE — Clinical Social Work Note (Signed)
CSW continues to follow this patient for d/c planning. CSW contacted Shaun ([phone]) and left a message. CSW is currently awaiting follow-up regarding bed availability.   [place], [place] Weekend Clinical Social Worker [phone]

## 2014-02-05 ENCOUNTER — Inpatient Hospital Stay
Admission: RE | Admit: 2014-02-05 | Discharge: 2014-03-23 | Disposition: A | Discharge status: Nursing Home (Includes ICF) | Payer: Medicaid Other | Source: Ambulatory Visit | Attending: Internal Medicine | Admitting: Internal Medicine

## 2014-02-05 DIAGNOSIS — R222 Localized swelling, mass and lump, trunk: Principal | ICD-10-CM

## 2014-02-05 LAB — CBC
HCT: 22.3 % — ABNORMAL LOW (ref 39.0–52.0)
HEMOGLOBIN: 7.5 g/dL — AB (ref 13.0–17.0)
MCH: 34.6 pg — AB (ref 26.0–34.0)
MCHC: 33.6 g/dL (ref 30.0–36.0)
MCV: 102.8 fL — ABNORMAL HIGH (ref 78.0–100.0)
Platelets: 74 10*3/uL — ABNORMAL LOW (ref 150–400)
RBC: 2.17 MIL/uL — AB (ref 4.22–5.81)
RDW: 17.6 % — ABNORMAL HIGH (ref 11.5–15.5)
WBC: 1.7 10*3/uL — ABNORMAL LOW (ref 4.0–10.5)

## 2014-02-05 LAB — BASIC METABOLIC PANEL
ANION GAP: 6 (ref 5–15)
BUN: 5 mg/dL — ABNORMAL LOW (ref 6–23)
CHLORIDE: 102 meq/L (ref 96–112)
CO2: 26 mEq/L (ref 19–32)
Calcium: 8.5 mg/dL (ref 8.4–10.5)
Creatinine, Ser: 0.48 mg/dL — ABNORMAL LOW (ref 0.50–1.35)
GFR calc Af Amer: >90 mL/min (ref >90)
GFR calc non Af Amer: >90 mL/min (ref >90)
Glucose, Bld: 76 mg/dL (ref 70–99)
POTASSIUM: 4.1 meq/L (ref 3.7–5.3)
SODIUM: 134 meq/L — AB (ref 137–147)

## 2014-02-05 LAB — CK TOTAL AND CKMB (NOT AT ARMC)
CK TOTAL: 81 U/L (ref 7–232)
CK, MB: <1 ng/mL (ref 0.3–4.0)

## 2014-02-05 MED ORDER — FLUCONAZOLE 200 MG PO TABS: 400.0000 mg | ORAL_TABLET | Freq: Every day | ORAL | Status: DC

## 2014-02-05 MED ORDER — PROMETHAZINE HCL 12.5 MG PO TABS: 12.5000 mg | ORAL_TABLET | Freq: Four times a day (QID) | ORAL | Status: DC | PRN

## 2014-02-05 MED ORDER — MORPHINE SULFATE ER 60 MG PO TBCR: 60.0000 mg | EXTENDED_RELEASE_TABLET | Freq: Two times a day (BID) | ORAL | Status: DC

## 2014-02-05 MED ORDER — MORPHINE SULFATE 15 MG PO TABS: 15.0000 mg | ORAL_TABLET | ORAL | Status: DC | PRN

## 2014-02-05 MED ORDER — SODIUM CHLORIDE 0.9 % IV SOLN: 700.0000 mg | INTRAVENOUS | Status: DC

## 2014-02-05 MED ORDER — FERROUS SULFATE 325 (65 FE) MG PO TABS: 325.0000 mg | ORAL_TABLET | Freq: Two times a day (BID) | ORAL | Status: AC

## 2014-02-05 MED ORDER — DEXTROSE 5 % IV SOLN: 2.0000 g | Freq: Two times a day (BID) | INTRAVENOUS | Status: DC

## 2014-02-05 MED ORDER — METHOCARBAMOL 500 MG PO TABS: 500.0000 mg | ORAL_TABLET | Freq: Four times a day (QID) | ORAL | Status: AC | PRN

## 2014-02-05 NOTE — Progress Notes (Signed)
CSW (Clinical Education officer, museum) spoke with [facility] admissions and they do have bed for pt today. CSW informed pt and text paged MD to notify.  [place], [place]

## 2014-02-05 NOTE — Discharge Summary (Signed)
Physician Discharge Summary  Samuel Schultz DPO:[id number] DOB: 1975/08/21 DOA: 01/26/2014  PCP: [name], MD  Admit date: 01/26/2014 Discharge date: 02/05/2014  Time spent: 35 minutes  Recommendations for Outpatient Follow-up:  1. Patient to receive 8 weeks of antibiotic therapy with Cefepime, Daptomycin and Flunconozole.  2. Will need outpatient follow up with Dr Linus Salmons of Infectious Disease  3. Please obtain a BMP and CBC in 4-5 days  Discharge Diagnoses:  Principal Problem:   Abscess in epidural space of lumbar spine Active Problems:   AIDS   Large cell lymphoma   Cirrhosis, non-alcoholic   Pancytopenia   Epidural abscess   Discharge Condition: Stable/Improved  Diet recommendation: Regular Diet  Filed Weights   02/02/14 0443 02/03/14 0500 02/05/14 0458  Weight: 83.099 kg (183 lb 3.2 oz) 82.781 kg (182 lb 8 oz) 81.1 kg (178 lb 12.7 oz)    History of present illness:  Samuel Schultz is a 38 y.o. male with history of AIDS, lymphoma, nonalcoholic cirrhosis, coagulopathy, pancytopenia presented to the ER because of worsening back pain. Patient states that he has been having pain in his low back last few days it has been radiating to his right leg. Denies any fever chills. Patient was recently admitted for discitis and is on IV daptomycin and ceftriaxone. MRI L-spine shows epidural abscess L5-S1 area. On-call neurosurgeon Dr. Saintclair Halsted was consulted and Dr. Saintclair Halsted at this time feels patient does not have any cauda equina features. And neurosurgeon has recommended infectious disease input and ED physician had discussed with on-call infectious disease consultant who at this time is recommended to continue present antibiotics and they will be seeing patient in consult. Blood cultures were obtained and patient has been admitted for further management. On exam patient has significant pain on moving his right lower extremity. He is able to move his left lower extremity without difficulty. Denies any  nausea vomiting abdominal pain diarrhea chest pain or shortness of breath.   Hospital Course:  38 year old male hospitalized in June with polymicrobial bacteremia felt to be related to colitis. His blood cultures were positive for vancomycin and ampicillin sensitive enterococcus, vancomycin resistant enterococcus, and Candida albicans. He received a little over 2 weeks of therapy with daptomycin and fluconazole, completing therapy in late June. He was readmitted on July 5 with severe, acute back pain and was found to have L3-4 diskitis & osteomyelitis. He had lumbar aspirations on July 6 and July 10. Gram stain and culture were negative on both specimens. He was started on daptomycin and ceftazidime on July 10 and discharged to a skilled nursing facility. He presented with worsening back pain. MRI shows possible L5-S1 discitis with epidural abscess. Neurosurgery does not see acute surgical need. Infectious disease following and recommend aspiration of disc space area. He underwent fluoroscopic guided aspiration on 01/31/2014. Acid fast smear and fungal cultures were negative. The blood cultures that were obtained on 01/26/2014 show no growth after 5 days of incubation. During this hospitalization he was seen by Dr.Cram of neurosurgery and felt to be poor candidate for surgical intervention. Dr. Linus Salmons of infectious disease recommending an 8 week course of cefepime, fluconazole, daptomycin. Patient expressing his preferences to not be discharged to [facility]. Social services sent referrals to other facilities however there was no bed availability for long term medicaid. Patient becoming notably upset with this. He was informed that he could stay at the [facility] until a bed opened up at the facility of his choice. Otherwise, he was  explained that he was stable for discharge and did not require further treatment in the hospital setting. I explained that he would need 8 weeks of antibiotic therapy and close  outpatient follow up with Infectious Disease.    Procedures:   Fluoro guided aspiration of right L4 psoas fluid collection   Consultations:  Infectious Disease  Nuerosurgery  Discharge Exam: Filed Vitals:   02/05/14 0458  BP: 105/53  Pulse: 85  Temp: 98.2 F (36.8 C)  Resp: 18   General exam: No acute distress, he was ambulated down the hallway.  Respiratory system: Clear. No increased work of breathing.  Cardiovascular system: S1 & S2 heard, RRR. No JVD, murmurs, gallops, clicks or pedal edema.  Gastrointestinal system: Abdomen is nondistended, soft and nontender. Normal bowel sounds heard.  Central nervous system: Alert and oriented. No focal neurological deficits.  Extremities: Symmetric 5 x 5 power. Limited evaluation of right lower extremity secondary to pain.    Discharge Instructions You were cared for by a hospitalist during your hospital stay. If you have any questions about your discharge medications or the care you received while you were in the hospital after you are discharged, you can call the unit and asked to speak with the hospitalist on call if the hospitalist that took care of you is not available. Once you are discharged, your primary care physician will handle any further medical issues. Please note that NO REFILLS for any discharge medications will be authorized once you are discharged, as it is imperative that you return to your primary care physician (or establish a relationship with a primary care physician if you do not have one) for your aftercare needs so that they can reassess your need for medications and monitor your lab values.  Discharge Instructions   Call MD for:  difficulty breathing, headache or visual disturbances    Complete by:  As directed      Call MD for:  extreme fatigue    Complete by:  As directed      Call MD for:  persistant dizziness or light-headedness    Complete by:  As directed      Call MD for:  persistant nausea and  vomiting    Complete by:  As directed      Call MD for:  redness, tenderness, or signs of infection (pain, swelling, redness, odor or green/yellow discharge around incision site)    Complete by:  As directed      Call MD for:  severe uncontrolled pain    Complete by:  As directed      Call MD for:  temperature >100.4    Complete by:  As directed      Diet - low sodium heart healthy    Complete by:  As directed      Increase activity slowly    Complete by:  As directed             Medication List    STOP taking these medications       CUBICIN IV     FORTAZ 2 G Solr injection  Generic drug:  Ceftazidime     Vitamin K (Phytonadione) 100 MCG Tabs      TAKE these medications       atovaquone 750 MG/5ML suspension  Commonly known as:  MEPRON  Take 750 mg by mouth daily.     azithromycin 600 MG tablet  Commonly known as:  ZITHROMAX  Take 1,200 mg by mouth once a  week. saturday     buPROPion 300 MG 24 hr tablet  Commonly known as:  WELLBUTRIN XL  Take 300 mg by mouth daily.     ceFEPIme 2 g in dextrose 5 % 50 mL  Inject 2 g into the vein every 12 (twelve) hours.     clonazePAM 0.5 MG tablet  Commonly known as:  KLONOPIN  Take 0.5 mg by mouth 2 (two) times daily as needed for anxiety.     cyclobenzaprine 5 MG tablet  Commonly known as:  FLEXERIL  Take 1 tablet (5 mg total) by mouth at bedtime.     DAPTOmycin 700 mg in sodium chloride 0.9 % 100 mL  Inject 700 mg into the vein daily.     Darunavir Ethanolate 800 MG tablet  Commonly known as:  PREZISTA  Take 1 tablet (800 mg total) by mouth daily.     emtricitabine-tenofovir 200-300 MG per tablet  Commonly known as:  TRUVADA  Take 1 tablet by mouth at bedtime.     ferrous sulfate 325 (65 FE) MG tablet  Take 1 tablet (325 mg total) by mouth 2 (two) times daily with a meal.     fluconazole 200 MG tablet  Commonly known as:  DIFLUCAN  Take 2 tablets (400 mg total) by mouth daily.     gabapentin 400 MG capsule   Commonly known as:  NEURONTIN  Take 400 mg by mouth 3 (three) times daily.     methocarbamol 500 MG tablet  Commonly known as:  ROBAXIN  Take 1 tablet (500 mg total) by mouth every 6 (six) hours as needed for muscle spasms.     morphine 60 MG 12 hr tablet  Commonly known as:  MS CONTIN  Take 1 tablet (60 mg total) by mouth every 12 (twelve) hours.     morphine 15 MG tablet  Commonly known as:  MSIR  Take 1 tablet (15 mg total) by mouth every 4 (four) hours as needed for severe pain.     omeprazole 20 MG capsule  Commonly known as:  PRILOSEC  Take 20 mg by mouth 2 (two) times daily.     potassium chloride SA 20 MEQ tablet  Commonly known as:  K-DUR,KLOR-CON  Take 20 mEq by mouth daily.     promethazine 12.5 MG tablet  Commonly known as:  PHENERGAN  Take 1 tablet (12.5 mg total) by mouth every 6 (six) hours as needed for nausea or vomiting.     ritonavir 100 MG capsule  Commonly known as:  NORVIR  Take 1 capsule (100 mg total) by mouth daily with breakfast. With Prezista     sucralfate 1 G tablet  Commonly known as:  CARAFATE  Take 1 g by mouth 3 (three) times daily with meals.     temazepam 7.5 MG capsule  Commonly known as:  RESTORIL  Take 7.5 mg by mouth at bedtime as needed for sleep.       Allergies  Allergen Reactions  . Codeine Anaphylaxis, Hives, Nausea And Vomiting, Nausea Only and Swelling  . Oxycodone Other (See Comments)    Other reaction(s): Dizziness Lightheaded, nausea       Follow-up Information   Follow up with MUND, PAMELA, MD In 2 weeks.   Specialty:  Internal Medicine   Contact information:   [place] [id number] [phone]       Follow up with Scharlene Gloss, MD In 2 weeks.   Specialty:  Infectious Diseases  Contact information:   [address] [place] [address] [phone]        The results of significant diagnostics from this hospitalization (including imaging, microbiology, ancillary  and laboratory) are listed below for reference.    Significant Diagnostic Studies: Mr Lumbar Spine W Wo Contrast  01/26/2014   CLINICAL DATA:  History of spinal infection. On antibiotics. Increasing back pain and right leg weakness. HIV, cirrhosis  EXAM: MRI LUMBAR SPINE WITHOUT AND WITH CONTRAST  TECHNIQUE: Multiplanar and multiecho pulse sequences of the lumbar spine were obtained without and with intravenous contrast.  CONTRAST:  77mL MULTIHANCE GADOBENATE DIMEGLUMINE 529 MG/ML IV SOLN  COMPARISON:  Lumbar MRI 12/10/2013  FINDINGS: Image quality degraded by technical factors and significant motion.  Discitis and osteomyelitis at L3-4 as noted on prior studies. Diffuse bone marrow edema is present similar to the prior MRI. There is progressive loss of disc space at this level due to infection. No epidural abscess at L3-4. No fracture.  New area of edema in the disc space at L5-S1 with mild adjacent bone marrow edema. Interval development of epidural abscess just below the L5-S1 level to the left of midline. This was not present previously and shows rim enhancement and central fluid signal intensity. No significant nerve root compression although the abscess may be touching the left S1 nerve root.  Negative for acute fracture. Conus medullaris not optimally seen on this study.  IMPRESSION: Disc space infection L3-4 progressive disc space narrowing but no epidural abscess at L3-4.  Interval development of epidural abscess to left of midline just below the L5-S1 disc space. Presumably the L5-S1 disc space is also infected at this time. The abscess was not present on prior studies.  These results were called by telephone at the time of interpretation on 01/26/2014 at 9:27 pm to Dr. Deno Etienne , who verbally acknowledged these results.   Electronically Signed   By: Franchot Gallo M.D.   On: 01/26/2014 21:29   Ir Fluoro Guide Ndl Plmt / Bx  01/31/2014   CLINICAL DATA:  MR demonstrates progressive discitis at l3-4  and new discitis l5-s1 with adjacent epidural abscess. there is also a small fluid signal collection in the right paraspinal/ psoas region at the l4 level which was not evident on prior study.  EXAM: IR FLUORO GUIDE NEEDLE PLACEMENT /BIOPSY x2  MEDICATIONS: Intravenous Fentanyl and Versed were administered as conscious sedation during continuous cardiorespiratory monitoring by the radiology RN, with a total moderate sedation time of 30 minutes.  PROCEDURE: The procedure, risks (including but not limited to bleeding, infection, organ damage ), benefits, and alternatives were explained to the patient. Questions regarding the procedure were encouraged and answered. The patient understands and consents to the procedure.  Patient placed prone. Lumbar region prepped and draped in usual sterile fashion. Maximal barrier sterile technique was utilized including caps, mask, sterile gowns, sterile gloves, sterile drape, hand hygiene and skin antiseptic. Under fluoroscopic guidance, a 16 gauge trocar needle was advanced to the right paraspinal/psoas collection at the L4 level using bony landmarks. Approximately 2 mL of bloody fluid could be aspirated. The needle was removed.  In similar fashion, using new sterile equipment, a 16 gauge trocar needle was advanced into the L5-S1 interspace from a right posterolateral approach under fluoroscopy. Needle tip position within the central third of the disc was confirmed on AP and lateral imaging. Approximately 2 mL of bloody fluid were aspirated. The needle was removed.  Both aspirates were sent  separately for Gram stain, culture and sensitivity, AFB if, and fungal culture.  The patient tolerated the procedure well.  Complications: None immediate  FINDINGS: Aspiration of the right paraspinal/psoas collection at L4 returned only a scant amount of bloody fluid/ debris, sent for culture as above.  Aspiration of the L5-S1 disc returned only a scant amount of bloody fluid/ debris, sent for  culture as above.  IMPRESSION: 1. Technically successful aspiration of right paraspinal/psoas abscess. 2. Technically successful aspiration of L5-S1 disc under fluoroscopy.   Electronically Signed   By: Arne Cleveland M.D.   On: 01/31/2014 14:46   Ir Fluoro Guide Ndl Plmt / Bx  01/31/2014   CLINICAL DATA:  MR demonstrates progressive discitis at l3-4 and new discitis l5-s1 with adjacent epidural abscess. there is also a small fluid signal collection in the right paraspinal/ psoas region at the l4 level which was not evident on prior study.  EXAM: IR FLUORO GUIDE NEEDLE PLACEMENT /BIOPSY x2  MEDICATIONS: Intravenous Fentanyl and Versed were administered as conscious sedation during continuous cardiorespiratory monitoring by the radiology RN, with a total moderate sedation time of 30 minutes.  PROCEDURE: The procedure, risks (including but not limited to bleeding, infection, organ damage ), benefits, and alternatives were explained to the patient. Questions regarding the procedure were encouraged and answered. The patient understands and consents to the procedure.  Patient placed prone. Lumbar region prepped and draped in usual sterile fashion. Maximal barrier sterile technique was utilized including caps, mask, sterile gowns, sterile gloves, sterile drape, hand hygiene and skin antiseptic. Under fluoroscopic guidance, a 16 gauge trocar needle was advanced to the right paraspinal/psoas collection at the L4 level using bony landmarks. Approximately 2 mL of bloody fluid could be aspirated. The needle was removed.  In similar fashion, using new sterile equipment, a 16 gauge trocar needle was advanced into the L5-S1 interspace from a right posterolateral approach under fluoroscopy. Needle tip position within the central third of the disc was confirmed on AP and lateral imaging. Approximately 2 mL of bloody fluid were aspirated. The needle was removed.  Both aspirates were sent separately for Gram stain, culture and  sensitivity, AFB if, and fungal culture.  The patient tolerated the procedure well.  Complications: None immediate  FINDINGS: Aspiration of the right paraspinal/psoas collection at L4 returned only a scant amount of bloody fluid/ debris, sent for culture as above.  Aspiration of the L5-S1 disc returned only a scant amount of bloody fluid/ debris, sent for culture as above.  IMPRESSION: 1. Technically successful aspiration of right paraspinal/psoas abscess. 2. Technically successful aspiration of L5-S1 disc under fluoroscopy.   Electronically Signed   By: Arne Cleveland M.D.   On: 01/31/2014 14:46    Microbiology: Recent Results (from the past 240 hour(s))  CULTURE, BLOOD (ROUTINE X 2)     Status: None   Collection Time    01/26/14  4:49 PM      Result Value Ref Range Status   Specimen Description BLOOD RIGHT PICC LINE   Final   Special Requests BOTTLES DRAWN AEROBIC AND ANAEROBIC 10CC   Final   Culture  Setup Time     Final   Value: 01/26/2014 23:10     Performed at [facility]   Culture     Final   Value: NO GROWTH 5 DAYS     Performed at [facility]   Report Status 02/01/2014 FINAL   Final  CULTURE, BLOOD (ROUTINE X 2)  Status: None   Collection Time    01/26/14  5:00 PM      Result Value Ref Range Status   Specimen Description BLOOD ARM LEFT   Final   Special Requests BOTTLES DRAWN AEROBIC AND ANAEROBIC 10CC   Final   Culture  Setup Time     Final   Value: 01/26/2014 23:08     Performed at [facility]   Culture     Final   Value: NO GROWTH 5 DAYS     Performed at [facility]   Report Status 02/01/2014 FINAL   Final  CULTURE, ROUTINE-ABSCESS     Status: None   Collection Time    01/31/14 12:14 PM      Result Value Ref Range Status   Specimen Description ABSCESS RIGHT   Final   Special Requests L4   Final   Gram Stain     Final   Value: FEW WBC PRESENT,BOTH PMN AND MONONUCLEAR     NO SQUAMOUS EPITHELIAL CELLS SEEN     NO ORGANISMS SEEN      Performed at [facility]   Culture     Final   Value: NO GROWTH 3 DAYS     Performed at [facility]   Report Status 02/03/2014 FINAL   Final  FUNGUS CULTURE W SMEAR     Status: None   Collection Time    01/31/14 12:14 PM      Result Value Ref Range Status   Specimen Description ABSCESS RIGHT   Final   Special Requests L4   Final   Fungal Smear     Final   Value: NO YEAST OR FUNGAL ELEMENTS SEEN     Performed at [facility]   Culture     Final   Value: CULTURE IN PROGRESS FOR FOUR WEEKS     Performed at [facility]   Report Status PENDING   Incomplete  AFB CULTURE WITH SMEAR     Status: None   Collection Time    01/31/14 12:14 PM      Result Value Ref Range Status   Specimen Description ABSCESS RIGHT   Final   Special Requests L4   Final   Acid Fast Smear     Final   Value: NO ACID FAST BACILLI SEEN     Performed at [facility]   Culture     Final   Value: CULTURE WILL BE EXAMINED FOR 6 WEEKS BEFORE ISSUING A FINAL REPORT     Performed at [facility]   Report Status PENDING   Incomplete  CULTURE, ROUTINE-ABSCESS     Status: None   Collection Time    01/31/14 12:15 PM      Result Value Ref Range Status   Specimen Description ABSCESS   Final   Special Requests L5 S1 DISC SPACE   Final   Gram Stain     Final   Value: FEW WBC PRESENT,BOTH PMN AND MONONUCLEAR     NO SQUAMOUS EPITHELIAL CELLS SEEN     NO ORGANISMS SEEN     Performed at [facility]   Culture     Final   Value: NO GROWTH 3 DAYS     Performed at [facility]   Report Status 02/03/2014 FINAL   Final  FUNGUS CULTURE W SMEAR     Status: None   Collection Time    01/31/14 12:15 PM  Result Value Ref Range Status   Specimen Description ABSCESS   Final   Special Requests L5 S1 DISC SPACE   Final   Fungal Smear     Final   Value: NO YEAST OR FUNGAL ELEMENTS SEEN     Performed at [facility]   Culture     Final   Value:  CULTURE IN PROGRESS FOR FOUR WEEKS     Performed at [facility]   Report Status PENDING   Incomplete  AFB CULTURE WITH SMEAR     Status: None   Collection Time    01/31/14 12:15 PM      Result Value Ref Range Status   Specimen Description ABSCESS   Final   Special Requests L5 S1 DISC SPACE   Final   Acid Fast Smear     Final   Value: NO ACID FAST BACILLI SEEN     Performed at [facility]   Culture     Final   Value: CULTURE WILL BE EXAMINED FOR 6 WEEKS BEFORE ISSUING A FINAL REPORT     Performed at [facility]   Report Status PENDING   Incomplete  MRSA PCR SCREENING     Status: None   Collection Time    01/31/14 10:52 PM      Result Value Ref Range Status   MRSA by PCR NEGATIVE  NEGATIVE Final   Comment:            The GeneXpert MRSA Assay (FDA     approved for NASAL specimens     only), is one component of a     comprehensive MRSA colonization     surveillance program. It is not     intended to diagnose MRSA     infection nor to guide or     monitor treatment for     MRSA infections.     Labs: Basic Metabolic Panel:  Recent Labs Lab 01/31/14 0505 02/01/14 0420 02/05/14 0455  NA 132* 133* 134*  K 4.4 4.2 4.1  CL 100 98 102  CO2 26 27 26   GLUCOSE 81 117* 76  BUN 5* 5* 5*  CREATININE 0.47* 0.50 0.48*  CALCIUM 8.5 8.5 8.5   Liver Function Tests: No results found for this basename: AST, ALT, ALKPHOS, BILITOT, PROT, ALBUMIN,  in the last 168 hours No results found for this basename: LIPASE, AMYLASE,  in the last 168 hours No results found for this basename: AMMONIA,  in the last 168 hours CBC:  Recent Labs Lab 01/30/14 0422 01/31/14 0505 02/01/14 0420 02/05/14 0455  WBC 1.6* 1.7* 1.9* 1.7*  HGB 7.5* 7.5* 7.7* 7.5*  HCT 22.3* 22.5* 22.9* 22.3*  MCV 101.8* 100.4* 102.2* 102.8*  PLT 76* 83* 84* 74*   Cardiac Enzymes:  Recent Labs Lab 02/02/14 0500 02/05/14 0455  CKTOTAL 107 81  CKMB  --  <1.0   BNP: BNP (last 3  results) No results found for this basename: PROBNP,  in the last 8760 hours CBG: No results found for this basename: GLUCAP,  in the last 168 hours     Signed:  Kelvin Cellar  [facility] 02/05/2014, 12:38 PM

## 2014-02-05 NOTE — Progress Notes (Signed)
CSW (Clinical Education officer, museum) prepared pt dc packet and placed with shadow chart. CSW arranged non-emergent ambulance transport. Pt, pt friend Rosaria Ferries, pt nurse, and facility informed. CSW signing off.  [place], [place]

## 2014-02-06 ENCOUNTER — Other Ambulatory Visit: Payer: Self-pay | Admitting: *Deleted

## 2014-02-06 MED ORDER — MORPHINE SULFATE 15 MG PO TABS: 15.0000 mg | ORAL_TABLET | ORAL | Status: DC | PRN

## 2014-02-06 MED ORDER — CLONAZEPAM 0.5 MG PO TABS: ORAL_TABLET | ORAL | Status: DC

## 2014-02-06 MED ORDER — TEMAZEPAM 7.5 MG PO CAPS: 7.5000 mg | ORAL_CAPSULE | Freq: Every evening | ORAL | Status: DC | PRN

## 2014-02-06 MED ORDER — MORPHINE SULFATE ER 60 MG PO TBCR: 60.0000 mg | EXTENDED_RELEASE_TABLET | Freq: Two times a day (BID) | ORAL | Status: DC

## 2014-02-06 NOTE — Telephone Encounter (Signed)
[facility] 

## 2014-02-07 ENCOUNTER — Non-Acute Institutional Stay (SKILLED_NURSING_FACILITY): Payer: Medicaid Other | Admitting: Internal Medicine

## 2014-02-07 DIAGNOSIS — M519 Unspecified thoracic, thoracolumbar and lumbosacral intervertebral disc disorder: Secondary | ICD-10-CM

## 2014-02-07 DIAGNOSIS — C8590 Non-Hodgkin lymphoma, unspecified, unspecified site: Secondary | ICD-10-CM

## 2014-02-07 DIAGNOSIS — K7460 Unspecified cirrhosis of liver: Secondary | ICD-10-CM

## 2014-02-07 DIAGNOSIS — C8589 Other specified types of non-Hodgkin lymphoma, extranodal and solid organ sites: Secondary | ICD-10-CM

## 2014-02-07 DIAGNOSIS — R634 Abnormal weight loss: Secondary | ICD-10-CM

## 2014-02-07 DIAGNOSIS — M4646 Discitis, unspecified, lumbar region: Secondary | ICD-10-CM

## 2014-02-07 DIAGNOSIS — B20 Human immunodeficiency virus [HIV] disease: Secondary | ICD-10-CM

## 2014-02-14 ENCOUNTER — Non-Acute Institutional Stay (SKILLED_NURSING_FACILITY): Payer: Medicaid Other | Admitting: Internal Medicine

## 2014-02-14 DIAGNOSIS — M519 Unspecified thoracic, thoracolumbar and lumbosacral intervertebral disc disorder: Secondary | ICD-10-CM

## 2014-02-14 DIAGNOSIS — K7460 Unspecified cirrhosis of liver: Secondary | ICD-10-CM

## 2014-02-14 DIAGNOSIS — R634 Abnormal weight loss: Secondary | ICD-10-CM

## 2014-02-14 DIAGNOSIS — M4646 Discitis, unspecified, lumbar region: Secondary | ICD-10-CM

## 2014-02-14 NOTE — Progress Notes (Addendum)
Patient ID: Samuel Schultz, male   DOB: [birth year], 38 y.o.   MRN: [id number]               HISTORY & PHYSICAL  DATE:  02/07/2014    FACILITY: [facility]    LEVEL OF CARE:   SNF   CHIEF COMPLAINT:  Readmission to the facility, post stay at [facility], 01/26/2014 through 02/05/2014.    HISTORY OF PRESENT ILLNESS:  This is a 38 year-old, medically complex, young man who was previously in the building with ah L3-L4 diskitis and osteomyelitis.  He was essentially here for completion of antibiotics for vancomycin-resistant Enterococcus and Candida albicans.    He was readmitted to hospital on July 5th with severe acute back pain and was found to have L3-L4 diskitis and osteomyelitis.  Aspirations at the time were culture-negative.  He was started on daptomycin and ceftazidine on July 10th and discharged back here.    He apparently started to develop worsening complaints of back pain and right lower extremity pain.  This was in spite of his narcotics being increased.  He had an MRI scheduled by Infectious Disease.  However, at some point he actually refused to go for this and, therefore, his appointment with Infectious Disease itself was postponed.    In any case, he presented to hospital with increasing pain and weakness of the right leg.  His MRI showed possible L5/S1 diskitis, which was new from the previous MRI.  Aspirate of this area was done at the suggestion of Infectious Disease.  This was negative for C&S, acid-fast, and fungal elements.  Blood cultures were negative after five days.  Dr. Linus Salmons of Infectious Disease recommended an eight-week course of cefepime, fluconazole, and daptomycin.  The original blood cultures did have Candida albicans.  He was sent back here for completion of these antibiotics.  It is noteworthy that he did not want to come back here and wanted to go to another facility.  However, of course, these antibiotics and the fact he is on Medicaid would certainly  supplant any reasonable chance of him going to another facility until the antibiotic issue is clarified.    PAST MEDICAL HISTORY/PROBLEM LIST:   Most pressing past medical history includes:    Abscess in the epidural space as discussed above.    AIDS.     Large cell lymphoma.    Cirrhosis, non-alcoholic.    Pancytopenia.    Epidural abscess.  Original blood cultures positive for vancomycin-resistant Enterococcus and Candida.     CURRENT MEDICATIONS:  Medication list is reviewed.    Atovaquone 750/5 mL suspension, 750 daily.    Azithromycin 1200 mg by mouth once a week.    Wellbutrin 300 mg, 24-hr tablet once daily.    Cefepime 2 g every 12 hours.    Klonopin 0.5 b.i.d.    Flexeril 5 mg by mouth at bedtime.    Daptomycin 700 mg daily.    Darunavir 800 mg daily.    Truvada 200/300, 1 tablet at bedtime.     Ferrous sulfate 325 b.i.d.    Diflucan 2 tablets, 400 by mouth daily.    Neurontin 400 mg three times daily.    Robaxin 500 q.6.    MS-Contin 60 mg q.12.    MSIR 15 q.4.    Prilosec 20 b.i.d.    Klor-Con 20 q.d.    Phenergan 12.5 q.d.    Norvir 100 mg with breakfast with his Prezista.    Carafate 1  g three times daily.    Restoril 7.5 q.h.s.    REVIEW OF SYSTEMS:   GENERAL:  The patient is lethargic, although he is conversational.   CHEST/RESPIRATORY:  He is not complaining of cough or shortness of breath.   CARDIAC:   No chest pain.   GI:  No abdominal pain.   MUSCULOSKELETAL:  He is complaining of severe right leg pain, pointing to his outer right hip/anterior right thigh.    PHYSICAL EXAMINATION:   GENERAL APPEARANCE:  The patient looks as though he has visibly lost weight.  As noted above, he is lethargic, probably due to the narcotics.   CHEST/RESPIRATORY:  Clear air entry bilaterally.   CARDIOVASCULAR:  CARDIAC:  Soft early systolic murmur.  No gallops.   GASTROINTESTINAL:  LIVER/SPLEEN/KIDNEYS:  His spleen is palpable just below the left  rib cage.  Liver is not palpable.  There is no tenderness.  No ascites.   GENITOURINARY:  BLADDER:   No suprapubic or costovertebral angle tenderness.    MUSCULOSKELETAL:   EXTREMITIES:   RIGHT LOWER EXTREMITY:  He has significant weakness in the right leg proximally, limited by pain.  He does not describe back pain.  Once again, he is describing pain in the right hip area.  There is no weakness of the right leg.   NEUROLOGICAL:    DEEP TENDON REFLEXES:  Reflexes are symmetric at the knee jerks.  Both toes are downgoing.   SENSATION/STRENGTH:  He has no sensory level that I can detect.    ASSESSMENT/PLAN:  New epidural abscess at L5/S1.  Aspirate here is negative.  He is on a complicated regimen of antibiotics as directed by Infectious Disease.  The discharge summary makes reference to "close follow-up" by Infectious Disease.  I would like to make sure that this will happen.    Lethargy.  I think this is probably narcotic-related.  He has no asterixis.  No signs of hepatic encephalopathy.    Large cell lymphoma.  Last note I see from Oncology/Hematology was in May.   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    HIV/AIDS.  Followed by Infectious Disease locally.  The exact status of this is not completely clear to me.    Prior imaging of the abdomen showing diffuse bowel wall thickening.  The nature of this is not clear.  He does not currently have any issues.    History of bone sclerotic lesions at T7, T10, T11, L3, and L5 with lytic lucencies in the bilateral ilium.  The feeling at the time was that these were treated bone mets.     Overall, this patient looks like he is declining from a systemic point of view.  I will have to see how he does.  As noted, he has lost about 10 pounds in the last three weeks.  He is looking increasingly gaunt.  If this continues, Hospice might be a reasonable alternative,  although I doubt he is going to want to hear that from me.

## 2014-02-16 ENCOUNTER — Non-Acute Institutional Stay (SKILLED_NURSING_FACILITY): Payer: Medicaid Other | Admitting: Internal Medicine

## 2014-02-16 DIAGNOSIS — R21 Rash and other nonspecific skin eruption: Secondary | ICD-10-CM

## 2014-02-18 DIAGNOSIS — R21 Rash and other nonspecific skin eruption: Secondary | ICD-10-CM | POA: Insufficient documentation

## 2014-02-18 NOTE — Progress Notes (Signed)
Patient ID: Samuel Schultz, male   DOB: [birth year], 38 y.o.   MRN: [id number]   This is an acute visit.  Level of care skilled.  Facility [facility].  Chief complaint Acute visit secondary to groin rash itching.  History of present illness.  Patient is a young male who complains is complaining of itching of his scrotal area -- complains of a rash.  .  Physical exam.  Groin area I do note what appears to be an erythematous-type rash of his scrotal area I do not see evidence of cellulitis-there is no drainage no bleeding -- there is no acute tenderness to palpation of the scrotum--I do not see the extension of the rash into other areas.  Assessment and plan.  Scrotal rash-Will treat with Nizoral cream twice a day until resolution if no resolution notify provider  [phone]

## 2014-02-19 ENCOUNTER — Encounter: Payer: Self-pay | Admitting: Infectious Disease

## 2014-02-19 ENCOUNTER — Ambulatory Visit (INDEPENDENT_AMBULATORY_CARE_PROVIDER_SITE_OTHER): Payer: Medicaid Other | Admitting: Infectious Disease

## 2014-02-19 VITALS — BP 103/66 | HR 102 | Temp 97.9°F | Wt 167.8 lb

## 2014-02-19 DIAGNOSIS — B20 Human immunodeficiency virus [HIV] disease: Secondary | ICD-10-CM

## 2014-02-19 DIAGNOSIS — B952 Enterococcus as the cause of diseases classified elsewhere: Secondary | ICD-10-CM

## 2014-02-19 DIAGNOSIS — R7881 Bacteremia: Secondary | ICD-10-CM

## 2014-02-19 DIAGNOSIS — Z1621 Resistance to vancomycin: Secondary | ICD-10-CM

## 2014-02-19 DIAGNOSIS — M4626 Osteomyelitis of vertebra, lumbar region: Secondary | ICD-10-CM

## 2014-02-19 DIAGNOSIS — A491 Streptococcal infection, unspecified site: Secondary | ICD-10-CM

## 2014-02-19 DIAGNOSIS — Z23 Encounter for immunization: Secondary | ICD-10-CM

## 2014-02-19 DIAGNOSIS — Z1639 Resistance to other specified antimicrobial drug: Secondary | ICD-10-CM

## 2014-02-19 DIAGNOSIS — M869 Osteomyelitis, unspecified: Secondary | ICD-10-CM

## 2014-02-19 DIAGNOSIS — R197 Diarrhea, unspecified: Secondary | ICD-10-CM

## 2014-02-19 DIAGNOSIS — B3789 Other sites of candidiasis: Secondary | ICD-10-CM

## 2014-02-19 DIAGNOSIS — A419 Sepsis, unspecified organism: Secondary | ICD-10-CM

## 2014-02-19 DIAGNOSIS — D61818 Other pancytopenia: Secondary | ICD-10-CM

## 2014-02-19 DIAGNOSIS — B377 Candidal sepsis: Secondary | ICD-10-CM

## 2014-02-19 NOTE — Progress Notes (Signed)
   Subjective:    Patient ID: Samuel Schultz, male    DOB: 1975-06-30, 38 y.o.   MRN: [id number]  HPI  32 year with HIV-AIDS, large B-cell lymphoma recently admitted to town with ampicillin sensitive enterococcal bacteremia but then found to have VRE and candida on surveillance cultures, treated with fluconazole and daptomycin for 14 days after negative blood cultures and with a negative TEE. 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 He got a dose of vancomycin and another antibiotic he thinks at [facility] unfortunately prior to transfer here. He's undergone IR guided biopy in July x 2 treated with daptomycin and cetazidime then with worsening in August and MRI showing:  Disc space infection L3-4 progressive disc space narrowing but no  epidural abscess at L3-4.  Interval development of epidural abscess to left of midline just  below the L5-S1 disc space. Presumably the L5-S1 disc space is also  infected at this time. The abscess was not present on prior studies  He had another IR guided biopsy for culture and candida albicans DID grow on one of the two fungal culture isolates. He had IV fluconazole--> oral fluconazole added to daptomycin and now cefepime and has been on all of these for past several weeks.  Neurosurgery did not feel he was good operative candidate during that admission. He has been DC to [facility]  He is covered in a blanket due to chills. He says his back pain is about the same. He is having 4-5 bm per day.   Back pain is about the same as when he was in the hospital   Review of Systems  Constitutional: Positive for chills, diaphoresis, activity change, appetite change, fatigue and unexpected weight change.  Eyes: Negative for pain.  Respiratory: Negative for cough and shortness of breath.   Gastrointestinal: Positive  for nausea, abdominal pain and diarrhea.  Endocrine: Positive for cold intolerance. Negative for heat intolerance.  Genitourinary: Negative for dysuria and difficulty urinating.  Musculoskeletal: Positive for arthralgias and back pain.  Allergic/Immunologic: Positive for immunocompromised state.  Neurological: Positive for weakness. Negative for syncope.  Psychiatric/Behavioral: Positive for behavioral problems. Negative for confusion, decreased concentration and agitation.       Objective:   Physical Exam  Constitutional: No distress.  HENT:  Head: Normocephalic.  Eyes: EOM are normal. Pupils are equal, round, and reactive to light.  Cardiovascular: Normal rate and regular rhythm.   Pulmonary/Chest: Effort normal. No respiratory distress. He has no wheezes.  Abdominal: He exhibits no distension.  Musculoskeletal: Normal range of motion.       Arms: Neurological: He is alert.  Skin: Skin is warm and dry.  Psychiatric: He is slowed. He exhibits a depressed mood.          Assessment & Plan:   Lumbar diskitis, epidural abscess: with candida albicans growing on one culture  --would continue to cover broadly given prior polymicrobial organisms in blood  --would finish total of 8 weeks of cubicin, and cefepime and continue fluconazole for a  Year minimum  Should check sensis on c. Albicans  HIV: contineu current regimen AND prophylactic meds Has been controlled  Loose stools; if increase further then check C Diff PCR

## 2014-02-19 NOTE — Patient Instructions (Signed)
Continue cefepime, daptomycin IV for another 6 weeks  Continue fluconazole for 12 months  RTC in 6 weeks with Dr. Tommy Medal, Dr Linus Salmons or ID clinic MD

## 2014-02-20 ENCOUNTER — Ambulatory Visit: Payer: Medicaid Other | Admitting: Gastroenterology

## 2014-02-21 NOTE — Progress Notes (Addendum)
Patient ID: Samuel Schultz, male   DOB: 01/24/1976, 38 y.o.   MRN: [id number]               PROGRESS NOTE  DATE:  02/14/2014     FACILITY: [facility]    LEVEL OF CARE:   SNF   Acute Visit   CHIEF COMPLAINT:  .Weight loss.    HISTORY OF PRESENT ILLNESS:  Samuel Schultz is a medically complex, 38 year-old man who has a history of recent readmission to the facility with a new area of epidural abscess at the L5/S1 level.  He is being treated with cefepime, daptomycin, and Diflucan, the latter since he had blood cultures I believe at the time of his June admission with an episodic diskitis at the L3-L4 level.    Since his return here, he has not done well.  I have been handed notes saying that he has had a 16-pound weight loss.  The patient states he is not able to eat.  He attributes this to some vomiting without clear nausea.  He is also complaining of dysphagia and odynophagia.  I looked at his lunch tray today.  There is very little consumed except for maybe some soup.    The other issue is that of depression.  He follows with Psychiatry here.  Last note is from 01/15/2014, however.  At that point, he was on Wellbutrin XL 300 mg.     PHYSICAL EXAMINATION:   GENERAL APPEARANCE:  The patient looks depressed.   CARDIOVASCULAR:  CARDIAC:   Heart sounds are normal.  He appears to be euvolemic.   CHEST/RESPIRATORY:  Clear air entry bilaterally.   GASTROINTESTINAL:  LIVER/SPLEEN/KIDNEYS:  No liver, no spleen.  No tenderness.    ASSESSMENT/PLAN:  Weight loss.  I am having difficulty quantifying the duration of this, whether this takes into account his last admission here or just since his arrival here on 02/05/2014.  Nevertheless, he is having complaints of odynophagia as well as dysphagia.  He does not have any oral thrush that I can see.  Nevertheless, with his constellation of issues, esophagitis, either peptic esophagitis or infectious esophagitis, seems most likely given what he is  describing.   He has significant depression, as well, and he is currently on Wellbutrin 300 mg.  That does not really seem to be helping him.    Pancytopenia.  Secondary to cirrhosis and other issues.  His white count is 1.7, hemoglobin 7.7, and platelet count 84.  These are actually quite good for him.  His comprehensive metabolic panel is also reasonably normal except for a slight elevation of his bilirubin at 1.8.  His albumin is 1.8 with a calcium of 8.

## 2014-02-23 ENCOUNTER — Non-Acute Institutional Stay (SKILLED_NURSING_FACILITY): Payer: Medicaid Other | Admitting: Internal Medicine

## 2014-02-23 DIAGNOSIS — M25572 Pain in left ankle and joints of left foot: Secondary | ICD-10-CM

## 2014-02-23 DIAGNOSIS — M25579 Pain in unspecified ankle and joints of unspecified foot: Secondary | ICD-10-CM

## 2014-02-23 NOTE — Progress Notes (Signed)
Patient ID: Samuel Schultz, male   DOB: 11/25/1975, 38 y.o.   MRN: [id number]   This is an acute visit.  Level of care skilled.  Facility [facility].  2 complaint-acute visit secondary to left foot pain.  History of present illness.  Patient is a 38 year old male with a complicated medical history including HIV-AIDS-lumbar discitis with an epidural abscess.  He is followed by infectious disease and is receiving IV antibiotics as well as fluconazole for suspected infection with candidiasis.  He has been relatively stable however today he is complaining of some left foot pain-he denies any specific trauma but says it hurts intermittently at times.  Family medical social history as been reviewed most recently note on 02/19/2014.  Medications have been reviewed per MAR.  Review of systems.  In general not complaining of fever or chills.  Respiratory does not complain shortness breath or cough.  Cardiac does not complaining of chest pain.  Muscle skeletal is complaining of some left foot pain this appears to be somewhat intermittent-.  Neurologic is not complaining of dizziness numbness or headache.  Physical exam.  Temperature is 98.2-pulse 90-respirations 22-blood pressure 108/76.  In general this is a pleasant young male in no distress sitting comfortably in his wheelchair.  His skin is warm and dry.  Chest is clear to auscultation there is no labored breathing.  Heart is regular rate and rhythm without murmur gallop or rub.   muscle skeletal-he has some slight edema of his left foot with intact pedal pulse--although possibly somewhat reduced-there is tenderness to palpation of the upper foot but I do not really any significant erythema-.  Appears to be able to flex at the ankle without significant pain-capillary refill appears to be intact.  I do not note any acute deformity.  Labs.  02/14/2014.  WBC 1.7-hemoglobin 7.7 as platelets 84,000.  Sodium 137 potassium 3.5 and  4 creatinine 0.35-bilirubin 1.8.  Assessment and plan.  Left foot pain-I did discuss this with patient Will x-ray the left foot and also secondary to the edema order a venous Doppler-Will await results at this point vital signs are stable patient does not appear to be in any distress but is concerned about pain.--He is on significant pain management including morphine routinely as well as when necessary  [id number]

## 2014-02-24 ENCOUNTER — Encounter: Payer: Self-pay | Admitting: Internal Medicine

## 2014-02-24 DIAGNOSIS — M25579 Pain in unspecified ankle and joints of unspecified foot: Secondary | ICD-10-CM | POA: Insufficient documentation

## 2014-02-26 LAB — FUNGUS CULTURE W SMEAR: Fungal Smear: NONE SEEN

## 2014-02-27 LAB — FUNGUS CULTURE W SMEAR: FUNGAL SMEAR: NONE SEEN

## 2014-03-05 ENCOUNTER — Encounter: Payer: Self-pay | Admitting: Infectious Disease

## 2014-03-11 ENCOUNTER — Non-Acute Institutional Stay (SKILLED_NURSING_FACILITY): Payer: Medicaid Other | Admitting: Internal Medicine

## 2014-03-11 DIAGNOSIS — C8580 Other specified types of non-Hodgkin lymphoma, unspecified site: Secondary | ICD-10-CM

## 2014-03-11 DIAGNOSIS — B20 Human immunodeficiency virus [HIV] disease: Secondary | ICD-10-CM

## 2014-03-11 DIAGNOSIS — G061 Intraspinal abscess and granuloma: Secondary | ICD-10-CM

## 2014-03-11 DIAGNOSIS — E440 Moderate protein-calorie malnutrition: Secondary | ICD-10-CM

## 2014-03-11 DIAGNOSIS — C8510 Unspecified B-cell lymphoma, unspecified site: Secondary | ICD-10-CM

## 2014-03-11 NOTE — Progress Notes (Signed)
Patient ID: Samuel Schultz, male   DOB: May 05, 1976, 38 y.o.   MRN: [id number]   This is a acute-routine visit.  Level care skilled.  Facility [facility].    CHIEF COMPLAINT: Medical management of chronic medical conditions including lumbar discitis with epidural abscess-HIV-non-alcoholic cirrhosis-.  .  HISTORY OF PRESENT ILLNESS: This is a 38 year-old, medically complex, young man who was previously in the building with ah L3-L4 diskitis and osteomyelitis. He was essentially here for completion of antibiotics for vancomycin-resistant Enterococcus and Candida albicans.  He was readmitted to hospital on July 5th with severe acute back pain and was found to have L3-L4 diskitis and osteomyelitis. Aspirations at the time were culture-negative. He was started on daptomycin and ceftazidine on July 10th and discharged back here.  He apparently started to develop worsening complaints of back pain and right lower extremity pain. This was in spite of his narcotics being increased. He had an MRI scheduled by Infectious Disease. However, at some point he actually refused to go for this and, therefore, his appointment with Infectious Disease itself was postponed.   In any case, he presented to hospital with increasing pain and weakness of the right leg. His MRI showed possible L5/S1 diskitis, which was new from the previous MRI. Aspirate of this area was done at the suggestion of Infectious Disease. This was negative for C&S, acid-fast, and fungal elements. Blood cultures were negative after five days. Dr. Linus Salmons of Infectious Disease recommended an eight-week course of cefepime, fluconazole, and daptomycin. The original blood cultures did have Candida albicans. He was sent back here for completion of these antibiotics.  Tissue there was concern about a poor appetite when he returned as however this has improved significantly which is encouraging--it appears he's gained about 7 pounds in recent weeks.  He continues to  be followed closely with serial labs by infectious disease.  He is complaining tonight a hard lump-type area more left breast area--it is not acutely tender but feels this is a new finding .  PAST MEDICAL HISTORY/PROBLEM LIST: Most pressing past medical history includes:  Abscess in the epidural space as discussed above.  AIDS.  Large cell lymphoma.  Cirrhosis, non-alcoholic.  Pancytopenia.  Epidural abscess. Original blood cultures positive for vancomycin-resistant Enterococcus and Candida.  CURRENT MEDICATIONS: Medication list is reviewed.  . Per MAR   REVIEW OF SYSTEMS:  GENERAL: Does not complain of fever chills  Skin-does not complaining of rashes or itching-does complain of a hard lump-type area of lower left breast .  CHEST/RESPIRATORY: He is not complaining of cough or shortness of breath.  CARDIAC: No chest pain.  GI: No abdominal pain. or recent nausea and vomiting-  No -recent diarrhea although he has had a history of this in the past  MUSCULOSKELETAL: History of severe right leg pain which appears to be significantly improved Neurologic does not really complain of numbness tingling or syncopal-type feelings.  Psych appears to be in good spirits--does have a history of depression is on Wellbutrin.    PHYSICAL EXAMINATION Temperature 98.4 pulse 98 respirations 18 blood pressure 101/51-110/59 in this range weight is 172 this appears again about 7 pounds in recent weeks:  GENERAL APPEARANCE: The patient appears bright and alert Skin is warm and dry there is a PICC line in place right upper arm.  Palpation of the lower left chest inferior medial to the left breast area there appears to be somewhat of a hardened area that is firm slight tenderness to palpation in the  area is not erythematous or warm .  CHEST/RESPIRATORY: Clear air entry bilaterally no labored breathing.  CARDIOVASCULAR:  CARDIAC: Soft early systolic murmur. No gallops. Borderline tachycardic    GASTROINTESTINAL:  Abdomen is soft with positive bowel sounds. There is no tenderness. No ascites.    MUSCULOSKELETAL moves all extremities x4 again does have right leg weakness-largely ambulates in a wheelchair--do not note any deformity:   Neurologic cranial nerves are intact his speech is clear.  Psych he is alert and oriented x3 pleasant and appropriate  Labs.  02/25/2014.  WBC 10.5 hemoglobin 8.5 platelets 99,000.  Sodium 135 potassium 4.2 BUN 8 creatinine 0.4.  #9 2015.  Liver function tests within normal limits except albumin of 1.8-calcium 8-bilirubin 1.8   .  ASSESSMENT/PLAN:  New epidural abscess at L5/S1. Aspirate here is negative. He is on a complicated regimen of antibiotics as directed by Infectious Disease.  Infectious disease is following him closely he has an appointment scheduled on October 26-.   Poor by mouth intake-this appears to have improved significantly which is encouraging he takes supplements well he is gaining weight  Large cell lymphoma.  note from Oncology/Hematology  in May. 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  HIV/AIDS. Followed by Infectious Disease locally. .   .  History of bone sclerotic lesions at T7, T10, T11, L3, and L5 with lytic lucencies in the bilateral ilium. The feeling at the time was that these were treated bone mets  History of question bony prominence left lower breas--tcould be numerous etiologies with patient's numerous conditions-Will start with an x-ray-with followup--this point there is no sign of acute discomfort Depression-continues on Wellbutrin-he appears to be in significantly better spirits recently which is encouraging.  [id number]  .

## 2014-03-12 ENCOUNTER — Non-Acute Institutional Stay (SKILLED_NURSING_FACILITY): Payer: Medicaid Other | Admitting: Internal Medicine

## 2014-03-12 DIAGNOSIS — R5081 Fever presenting with conditions classified elsewhere: Secondary | ICD-10-CM

## 2014-03-12 DIAGNOSIS — R634 Abnormal weight loss: Secondary | ICD-10-CM

## 2014-03-12 DIAGNOSIS — D61818 Other pancytopenia: Secondary | ICD-10-CM

## 2014-03-13 ENCOUNTER — Ambulatory Visit (HOSPITAL_COMMUNITY): Payer: Medicaid Other | Attending: Internal Medicine

## 2014-03-13 ENCOUNTER — Encounter: Payer: Self-pay | Admitting: *Deleted

## 2014-03-13 DIAGNOSIS — R222 Localized swelling, mass and lump, trunk: Secondary | ICD-10-CM | POA: Insufficient documentation

## 2014-03-13 DIAGNOSIS — Z8572 Personal history of non-Hodgkin lymphomas: Secondary | ICD-10-CM | POA: Insufficient documentation

## 2014-03-13 DIAGNOSIS — B20 Human immunodeficiency virus [HIV] disease: Secondary | ICD-10-CM | POA: Diagnosis not present

## 2014-03-14 ENCOUNTER — Other Ambulatory Visit: Payer: Self-pay | Admitting: *Deleted

## 2014-03-14 ENCOUNTER — Encounter: Payer: Self-pay | Admitting: Internal Medicine

## 2014-03-14 MED ORDER — MORPHINE SULFATE 15 MG PO TABS: 15.0000 mg | ORAL_TABLET | ORAL | Status: DC | PRN

## 2014-03-14 MED ORDER — MORPHINE SULFATE ER 60 MG PO TBCR: 60.0000 mg | EXTENDED_RELEASE_TABLET | Freq: Two times a day (BID) | ORAL | Status: DC

## 2014-03-14 NOTE — Telephone Encounter (Signed)
[facility] 

## 2014-03-14 NOTE — Progress Notes (Addendum)
Patient ID: Samuel Schultz, male   DOB: 26-Dec-1975, 38 y.o.   MRN: [id number]               PROGRESS NOTE  DATE:  03/12/2014    FACILITY: [facility]    LEVEL OF CARE:   SNF   Acute Visit   CHIEF COMPLAINT:  Fever.    HISTORY OF PRESENT ILLNESS:  Noted that the patient, starting yesterday, has been running low-grade fevers, yesterday of 102.5, today 100.5.  A chest x-ray was ordered.  However, he has refused this.    This gentleman is a very complex, 37 year-old man who was previously in the building with L3-L4 diskitis and osteomyelitis.   He came here on treatment of vancomycin for Enterococcus candida albicans sepsis.    He was readmitted to hospital in late August, now with L5-s1 diskitis, on a combination of daptomycin, cefepime, and Diflucan.  He has been back to see Infectious Disease on 02/05/2014, Dr.  Tommy Medal.  He is felt to have vertebral osteomyelitis due to fungal infection +/- bacterial infection.  He had polymicrobial bacteremia and fungemia.  His daptomycin and cefepime are due to end on 04/02/2014.  There are instructions for fluconazole for 12 months.  He is supposed to be getting weekly CBCs, basic metabolic panels, and CPKs.  I am not sure how well we are keeping up with this, although on 03/05/2014 his CBC showed a white count of 2.5, hemoglobin 8.5, and platelet count 99,000 with 59% granulocytes.  This is fairly stable for him.  His basic metabolic panel was normal and his CK was 32.    REVIEW OF SYSTEMS:  Review of systems today is really difficult.  The patient will not really answer my questions, does not look at me when I am in the room.  However:   CHEST/RESPIRATORY:  He is complaining of cough.   CARDIAC:   No clear chest pain.    GI:  Some diarrhea, but no clear abdominal pain.   GU:  He is not complaining of dysuria.     PHYSICAL EXAMINATION:   VITAL SIGNS:   O2 SATURATIONS:  95%.   RESPIRATIONS:  18 and unlabored.   PULSE:  125 and regular.     CHEST/RESPIRATORY:  Clear air entry bilaterally.  There are no crackles or wheezes.   CARDIOVASCULAR:  CARDIAC:   Heart sounds are normal.  There are no murmurs.  He appears to be euvolemic.    HEENT:   MOUTH/THROAT:   Oral exam is normal.    GASTROINTESTINAL:  ABDOMEN:   No masses.  There is some flank pain.   LIVER/SPLEEN/KIDNEYS:  No liver, no spleen.      GENITOURINARY:  BLADDER:   No suprapubic pain, but tremendous costovertebral angle tenderness.    ASSESSMENT/PLAN:  Low-grade fevers.  The possibilities here are really quite endless.  He will need blood cultures, urine culture, urinalysis, chest x-ray.  This could be a drug fever.  The status of his underlying lymphoma is unclear.  No empiric antibiotics.   Increasing debility.  This man has lost weight, at least visually.    ?Depression.  According to the nursing staff, he will look like he does today on some days and then the next day be up and talkative.  Certainly, today he looks depressed.    Pancytopenia secondary to, I think, underlying cirrhosis of the liver/lymphoma.  His counts are actually quite good for him. The status of the  lymphoma is not completely clear  Generally don't believe this man is doing well, however this is not related to his underlying abscess/osteo or its treatment. Await evaluation especially blood cultures/UA C+S. Has a PICC??

## 2014-03-14 NOTE — Telephone Encounter (Signed)
[facility] 

## 2014-03-15 LAB — AFB CULTURE WITH SMEAR (NOT AT ARMC)
ACID FAST SMEAR: NONE SEEN
ACID FAST SMEAR: NONE SEEN

## 2014-03-19 ENCOUNTER — Non-Acute Institutional Stay (SKILLED_NURSING_FACILITY): Payer: Medicaid Other | Admitting: Internal Medicine

## 2014-03-19 DIAGNOSIS — M4646 Discitis, unspecified, lumbar region: Secondary | ICD-10-CM

## 2014-03-19 DIAGNOSIS — C8590 Non-Hodgkin lymphoma, unspecified, unspecified site: Secondary | ICD-10-CM

## 2014-03-22 ENCOUNTER — Encounter: Payer: Self-pay | Admitting: Internal Medicine

## 2014-03-23 ENCOUNTER — Inpatient Hospital Stay (HOSPITAL_COMMUNITY)
Admission: EM | Admit: 2014-03-23 | Discharge: 2014-04-23 | DRG: 028 | Disposition: A | Discharge status: Skilled Nursing Facility | Payer: Medicaid Other | Attending: Internal Medicine | Admitting: Internal Medicine

## 2014-03-23 ENCOUNTER — Encounter (HOSPITAL_COMMUNITY): Payer: Self-pay | Admitting: Emergency Medicine

## 2014-03-23 ENCOUNTER — Emergency Department (HOSPITAL_COMMUNITY): Payer: Medicaid Other

## 2014-03-23 DIAGNOSIS — K766 Portal hypertension: Secondary | ICD-10-CM | POA: Diagnosis present

## 2014-03-23 DIAGNOSIS — M25551 Pain in right hip: Secondary | ICD-10-CM

## 2014-03-23 DIAGNOSIS — K7460 Unspecified cirrhosis of liver: Secondary | ICD-10-CM | POA: Diagnosis present

## 2014-03-23 DIAGNOSIS — M25552 Pain in left hip: Secondary | ICD-10-CM | POA: Diagnosis not present

## 2014-03-23 DIAGNOSIS — G061 Intraspinal abscess and granuloma: Secondary | ICD-10-CM | POA: Diagnosis present

## 2014-03-23 DIAGNOSIS — M4646 Discitis, unspecified, lumbar region: Secondary | ICD-10-CM

## 2014-03-23 DIAGNOSIS — I472 Ventricular tachycardia: Secondary | ICD-10-CM

## 2014-03-23 DIAGNOSIS — Z9221 Personal history of antineoplastic chemotherapy: Secondary | ICD-10-CM

## 2014-03-23 DIAGNOSIS — E43 Unspecified severe protein-calorie malnutrition: Secondary | ICD-10-CM

## 2014-03-23 DIAGNOSIS — F4310 Post-traumatic stress disorder, unspecified: Secondary | ICD-10-CM | POA: Diagnosis present

## 2014-03-23 DIAGNOSIS — R112 Nausea with vomiting, unspecified: Secondary | ICD-10-CM

## 2014-03-23 DIAGNOSIS — Z79899 Other long term (current) drug therapy: Secondary | ICD-10-CM | POA: Diagnosis not present

## 2014-03-23 DIAGNOSIS — B20 Human immunodeficiency virus [HIV] disease: Secondary | ICD-10-CM

## 2014-03-23 DIAGNOSIS — Z419 Encounter for procedure for purposes other than remedying health state, unspecified: Secondary | ICD-10-CM

## 2014-03-23 DIAGNOSIS — R109 Unspecified abdominal pain: Secondary | ICD-10-CM

## 2014-03-23 DIAGNOSIS — M4640 Discitis, unspecified, site unspecified: Secondary | ICD-10-CM

## 2014-03-23 DIAGNOSIS — K529 Noninfective gastroenteritis and colitis, unspecified: Secondary | ICD-10-CM

## 2014-03-23 DIAGNOSIS — Z8572 Personal history of non-Hodgkin lymphomas: Secondary | ICD-10-CM

## 2014-03-23 DIAGNOSIS — L89159 Pressure ulcer of sacral region, unspecified stage: Secondary | ICD-10-CM | POA: Diagnosis not present

## 2014-03-23 DIAGNOSIS — M659 Synovitis and tenosynovitis, unspecified: Secondary | ICD-10-CM | POA: Diagnosis present

## 2014-03-23 DIAGNOSIS — M4626 Osteomyelitis of vertebra, lumbar region: Secondary | ICD-10-CM

## 2014-03-23 DIAGNOSIS — G062 Extradural and subdural abscess, unspecified: Secondary | ICD-10-CM

## 2014-03-23 DIAGNOSIS — Z2239 Carrier of other specified bacterial diseases: Secondary | ICD-10-CM | POA: Insufficient documentation

## 2014-03-23 DIAGNOSIS — M25559 Pain in unspecified hip: Secondary | ICD-10-CM

## 2014-03-23 DIAGNOSIS — K5900 Constipation, unspecified: Secondary | ICD-10-CM | POA: Diagnosis present

## 2014-03-23 DIAGNOSIS — R21 Rash and other nonspecific skin eruption: Secondary | ICD-10-CM

## 2014-03-23 DIAGNOSIS — Z9049 Acquired absence of other specified parts of digestive tract: Secondary | ICD-10-CM | POA: Diagnosis present

## 2014-03-23 DIAGNOSIS — F419 Anxiety disorder, unspecified: Secondary | ICD-10-CM | POA: Diagnosis present

## 2014-03-23 DIAGNOSIS — R10A2 Flank pain, left side: Secondary | ICD-10-CM

## 2014-03-23 DIAGNOSIS — D684 Acquired coagulation factor deficiency: Secondary | ICD-10-CM | POA: Diagnosis not present

## 2014-03-23 DIAGNOSIS — M4644 Discitis, unspecified, thoracic region: Secondary | ICD-10-CM | POA: Insufficient documentation

## 2014-03-23 DIAGNOSIS — M869 Osteomyelitis, unspecified: Secondary | ICD-10-CM

## 2014-03-23 DIAGNOSIS — I8510 Secondary esophageal varices without bleeding: Secondary | ICD-10-CM | POA: Diagnosis present

## 2014-03-23 DIAGNOSIS — N179 Acute kidney failure, unspecified: Secondary | ICD-10-CM

## 2014-03-23 DIAGNOSIS — C8580 Other specified types of non-Hodgkin lymphoma, unspecified site: Secondary | ICD-10-CM

## 2014-03-23 DIAGNOSIS — D61818 Other pancytopenia: Secondary | ICD-10-CM

## 2014-03-23 DIAGNOSIS — D731 Hypersplenism: Secondary | ICD-10-CM | POA: Diagnosis present

## 2014-03-23 DIAGNOSIS — I4729 Other ventricular tachycardia: Secondary | ICD-10-CM

## 2014-03-23 DIAGNOSIS — Z21 Asymptomatic human immunodeficiency virus [HIV] infection status: Secondary | ICD-10-CM

## 2014-03-23 DIAGNOSIS — M009 Pyogenic arthritis, unspecified: Secondary | ICD-10-CM

## 2014-03-23 DIAGNOSIS — R52 Pain, unspecified: Secondary | ICD-10-CM

## 2014-03-23 DIAGNOSIS — E440 Moderate protein-calorie malnutrition: Secondary | ICD-10-CM

## 2014-03-23 DIAGNOSIS — E162 Hypoglycemia, unspecified: Secondary | ICD-10-CM

## 2014-03-23 LAB — URINALYSIS, ROUTINE W REFLEX MICROSCOPIC
Bilirubin Urine: NEGATIVE
GLUCOSE, UA: NEGATIVE mg/dL
HGB URINE DIPSTICK: NEGATIVE
Leukocytes, UA: NEGATIVE
Nitrite: NEGATIVE
PH: 6 (ref 5.0–8.0)
Protein, ur: NEGATIVE mg/dL
SPECIFIC GRAVITY, URINE: 1.025 (ref 1.005–1.030)
Urobilinogen, UA: 0.2 mg/dL (ref 0.0–1.0)

## 2014-03-23 LAB — CBC WITH DIFFERENTIAL/PLATELET
Basophils Absolute: 0 10*3/uL (ref 0.0–0.1)
Basophils Relative: 0 % (ref 0–1)
EOS ABS: 0.1 10*3/uL (ref 0.0–0.7)
EOS PCT: 3 % (ref 0–5)
HEMATOCRIT: 28 % — AB (ref 39.0–52.0)
Hemoglobin: 9.7 g/dL — ABNORMAL LOW (ref 13.0–17.0)
LYMPHS ABS: 0.9 10*3/uL (ref 0.7–4.0)
Lymphocytes Relative: 30 % (ref 12–46)
MCH: 33.3 pg (ref 26.0–34.0)
MCHC: 34.6 g/dL (ref 30.0–36.0)
MCV: 96.2 fL (ref 78.0–100.0)
MONO ABS: 0.3 10*3/uL (ref 0.1–1.0)
Monocytes Relative: 10 % (ref 3–12)
Neutro Abs: 1.7 10*3/uL (ref 1.7–7.7)
Neutrophils Relative %: 56 % (ref 43–77)
PLATELETS: 115 10*3/uL — AB (ref 150–400)
RBC: 2.91 MIL/uL — AB (ref 4.22–5.81)
RDW: 16.3 % — ABNORMAL HIGH (ref 11.5–15.5)
WBC: 2.9 10*3/uL — ABNORMAL LOW (ref 4.0–10.5)

## 2014-03-23 LAB — BASIC METABOLIC PANEL
Anion gap: 8 (ref 5–15)
BUN: 11 mg/dL (ref 6–23)
CALCIUM: 8.8 mg/dL (ref 8.4–10.5)
CO2: 21 mEq/L (ref 19–32)
CREATININE: 0.43 mg/dL — AB (ref 0.50–1.35)
Chloride: 104 mEq/L (ref 96–112)
GFR calc Af Amer: >90 mL/min (ref >90)
GFR calc non Af Amer: >90 mL/min (ref >90)
Glucose, Bld: 106 mg/dL — ABNORMAL HIGH (ref 70–99)
Potassium: 4.2 mEq/L (ref 3.7–5.3)
Sodium: 133 mEq/L — ABNORMAL LOW (ref 137–147)

## 2014-03-23 MED ORDER — MORPHINE SULFATE 2 MG/ML IJ SOLN
2.0000 mg | INTRAMUSCULAR | Status: DC | PRN
  Administered 2014-03-23 – 2014-03-27 (×11): 2 mg via INTRAVENOUS
  Filled 2014-03-23 (×12): qty 1

## 2014-03-23 MED ORDER — HYDROMORPHONE HCL 1 MG/ML IJ SOLN
0.5000 mg | Freq: Once | INTRAMUSCULAR | Status: AC
  Administered 2014-03-23: 0.5 mg via INTRAVENOUS
  Filled 2014-03-23: qty 1

## 2014-03-23 MED ORDER — SODIUM CHLORIDE 0.9 % IV BOLUS (SEPSIS)
1000.0000 mL | Freq: Once | INTRAVENOUS | Status: AC
  Administered 2014-03-23: 1000 mL via INTRAVENOUS

## 2014-03-23 MED ORDER — HYDROMORPHONE HCL 1 MG/ML IJ SOLN
INTRAMUSCULAR | Status: AC
  Filled 2014-03-23: qty 1

## 2014-03-23 MED ORDER — ONDANSETRON HCL 4 MG/2ML IJ SOLN
4.0000 mg | Freq: Once | INTRAMUSCULAR | Status: AC
  Administered 2014-03-23: 4 mg via INTRAVENOUS
  Filled 2014-03-23: qty 2

## 2014-03-23 MED ORDER — HYDROMORPHONE HCL 1 MG/ML IJ SOLN
0.5000 mg | Freq: Once | INTRAMUSCULAR | Status: AC
  Administered 2014-03-23: 0.5 mg via INTRAVENOUS

## 2014-03-23 NOTE — ED Notes (Signed)
Pt requesting his personal belongings from [facility]. AC notified and will pick them up. [place] notified to pack up belongings.

## 2014-03-23 NOTE — Discharge Instructions (Signed)
Urinalysis showed no infection. CT scan revealed a large amount of constipation. Increase fluid, fruit, fiber.  Also recommend Dulcolax, magnesium citrate, MiraLAX.

## 2014-03-23 NOTE — H&P (Signed)
PCP:   [name], MD   Chief Complaint:  Left flank pain  HPI: 38 year old male who   has a past medical history of HIV disease; Cirrhosis; PTSD (post-traumatic stress disorder); Depressive disorder; Cancer; Lymphoma; and Anemia. 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 At that time he had MRI done for the back pain which was negative but then which progress to discitis and vertebral osteomyelitis. At that time patient was not found to be a good operative candidate and was discharged to a nursing center with 8 weeks of Cubicin  and cefepime, with fluconazole.  Patient has been getting IV  cefepime at this time, and currently residing at the [facility] center. Today patient came to the hospital with chief complaint of left-sided flank pain, he denies fever, no dysuria urgency frequency of urination. He has difficulty walking at baseline and uses wheelchair. He denies chest pain or shortness of breath no nausea vomiting or diarrhea. In the ED CT stone protocol was done which was negative for ureter stone, but showed worsening changes with progressive discitis at T10-11. MRI of the lumbar spine was done which showed new discitis at T10-11, persistent osteomyelitis discitis at L3-4, marked worsening of the appearance of L5-S1 with fluid throughout the disc space edema within the L5 and S1 vertebral bodies. Abnormal material in the ventral epidural space that could be extruded disc material or ventral epidural abscess.    Allergies   Allergies  Allergen Reactions  . Codeine Anaphylaxis, Hives, Nausea And Vomiting, Nausea Only and Swelling  . Oxycodone Other (See Comments)    Other reaction(s): Dizziness Lightheaded, nausea      Past Medical History  Diagnosis Date  . HIV disease   . Cirrhosis   . PTSD (post-traumatic stress disorder)   . Depressive disorder     . Cancer   . Lymphoma   . Anemia     Past Surgical History  Procedure Laterality Date  . Cholecystectomy    . Appendectomy    . Tee without cardioversion N/A 11/13/2013    Procedure: TRANSESOPHAGEAL ECHOCARDIOGRAM (TEE);  Surgeon: Dorothy Spark, MD;  Location: [facility];  Service: Cardiovascular;  Laterality: N/A;  . Infection of spine      Prior to Admission medications   Medication Sig Start Date End Date Taking? Authorizing Provider  atovaquone (MEPRON) 750 MG/5ML suspension Take 750 mg by mouth daily.   Yes Historical Provider, MD  buPROPion (WELLBUTRIN XL) 300 MG 24 hr tablet Take 300 mg by mouth daily.   Yes Historical Provider, MD  ceFEPIme 2 g in dextrose 5 % 50 mL Inject 2 g into the vein every 12 (twelve) hours. 02/05/14  Yes Kelvin Cellar, MD  clonazePAM (KLONOPIN) 1 MG tablet Take 1 mg by mouth at bedtime.   Yes Historical Provider, MD  cyclobenzaprine (FLEXERIL) 5 MG tablet Take 1 tablet (5 mg total) by mouth at bedtime. 12/18/13  Yes Geradine Girt, DO  Darunavir Ethanolate (PREZISTA) 800 MG tablet Take 1 tablet (800 mg total) by mouth daily. 01/01/14  Yes Truman Hayward, MD  emtricitabine-tenofovir (TRUVADA) 200-300 MG per tablet Take 1 tablet by mouth at bedtime. 01/01/14  Yes Truman Hayward, MD  ferrous sulfate 325 (65 FE) MG tablet Take 1 tablet (325 mg total) by mouth 2 (two) times daily with a meal. 02/05/14  Yes Ezequiel  Coralyn Pear, MD  fluconazole (DIFLUCAN) 200 MG tablet Take 2 tablets (400 mg total) by mouth daily. 02/05/14  Yes Kelvin Cellar, MD  gabapentin (NEURONTIN) 400 MG capsule Take 400 mg by mouth 3 (three) times daily.   Yes Historical Provider, MD  omeprazole (PRILOSEC) 20 MG capsule Take 20 mg by mouth 2 (two) times daily.   Yes Historical Provider, MD  potassium chloride SA (K-DUR,KLOR-CON) 20 MEQ tablet Take 20 mEq by mouth daily.   Yes Historical Provider, MD  promethazine (PHENERGAN) 12.5 MG tablet Take 1 tablet (12.5 mg total) by mouth  every 6 (six) hours as needed for nausea or vomiting. 02/05/14  Yes Kelvin Cellar, MD  ritonavir (NORVIR) 100 MG capsule Take 1 capsule (100 mg total) by mouth daily with breakfast. With Prezista 01/01/14  Yes Truman Hayward, MD  sucralfate (CARAFATE) 1 G tablet Take 1 g by mouth 3 (three) times daily with meals.    Yes Historical Provider, MD  temazepam (RESTORIL) 7.5 MG capsule Take 1 capsule (7.5 mg total) by mouth at bedtime as needed for sleep. 02/06/14  Yes Mahima Pandey, MD  azithromycin (ZITHROMAX) 600 MG tablet Take 1,200 mg by mouth once a week. saturday    Historical Provider, MD  clonazePAM (KLONOPIN) 0.5 MG tablet Take one tablet by mouth twice daily as needed for anxiety 02/06/14   Blanchie Serve, MD  methocarbamol (ROBAXIN) 500 MG tablet Take 1 tablet (500 mg total) by mouth every 6 (six) hours as needed for muscle spasms. 02/05/14   Kelvin Cellar, MD  morphine (MS CONTIN) 60 MG 12 hr tablet Take 1 tablet (60 mg total) by mouth every 12 (twelve) hours. 03/14/14   Blanchie Serve, MD  morphine (MSIR) 15 MG tablet Take 1 tablet (15 mg total) by mouth every 4 (four) hours as needed for severe pain. 03/14/14   Blanchie Serve, MD    Social History:  reports that he has never smoked. He has never used smokeless tobacco. He reports that he does not drink alcohol or use illicit drugs.  Family History  Problem Relation Age of Onset  . Cancer Maternal Grandmother     breast ca  . Cancer Maternal Grandfather     colon ca     All the positives are listed in BOLD  Review of Systems:  HEENT: Headache, blurred vision, runny nose, sore throat Neck: Hypothyroidism, hyperthyroidism,,lymphadenopathy Chest : Shortness of breath, history of COPD, Asthma Heart : Chest pain, history of coronary arterey disease GI:  Nausea, vomiting, diarrhea, constipation, GERD GU: Dysuria, urgency, frequency of urination, hematuria Neuro: Stroke, seizures, syncope Psych: Depression, anxiety,  hallucinations   Physical Exam: Blood pressure 123/73, pulse 102, temperature 98.8 F (37.1 C), temperature source Oral, resp. rate 16, height 5\' 9"  (1.753 m), weight 74.844 kg (165 lb), SpO2 100.00%. Constitutional:   Patient is a well-developed and well-nourished male in no acute distress and cooperative with exam. Head: Normocephalic and atraumatic Mouth: Mucus membranes moist Eyes: PERRL, EOMI, conjunctivae normal Neck: Supple, No Thyromegaly Cardiovascular: RRR, S1 normal, S2 normal Pulmonary/Chest: CTAB, no wheezes, rales, or rhonchi Abdominal: Soft. Non-tender, non-distended, bowel sounds are normal, no masses, organomegaly, or guarding present.  Neurological: A&O x3, Strenght is normal and symmetric bilaterally, cranial nerve II-XII are grossly intact, no focal motor deficit, sensory intact to light touch bilaterally.  Extremities : No Cyanosis, Clubbing or Edema  Labs on Admission:  Basic Metabolic Panel:  Recent Labs Lab 03/23/14 1403  NA 133*  K 4.2  CL 104  CO2 21  GLUCOSE 106*  BUN 11  CREATININE 0.43*  CALCIUM 8.8   Liver Function Tests: No results found for this basename: AST, ALT, ALKPHOS, BILITOT, PROT, ALBUMIN,  in the last 168 hours No results found for this basename: LIPASE, AMYLASE,  in the last 168 hours No results found for this basename: AMMONIA,  in the last 168 hours CBC:  Recent Labs Lab 03/23/14 1403  WBC 2.9*  NEUTROABS 1.7  HGB 9.7*  HCT 28.0*  MCV 96.2  PLT 115*   Cardiac Enzymes: No results found for this basename: CKTOTAL, CKMB, CKMBINDEX, TROPONINI,  in the last 168 hours  BNP (last 3 results) No results found for this basename: PROBNP,  in the last 8760 hours CBG: No results found for this basename: GLUCAP,  in the last 168 hours  Radiological Exams on Admission: Mr Lumbar Spine Wo Contrast  03/23/2014   CLINICAL DATA:  Multilevel infectious discitis. HIV infection. Lymphoma. Left flank pain of 2 days duration.  EXAM: MRI  LUMBAR SPINE WITHOUT CONTRAST  TECHNIQUE: Multiplanar, multisequence MR imaging of the lumbar spine was performed. No intravenous contrast was administered.  COMPARISON:  CT scan same day.  Previous MRI 01/26/2014  FINDINGS: The scan covers the region from lower T9 through S3.  T9-10 does not appear involved.  T10-11: Discitis osteomyelitis at this level with fluid intensity material in the disc space and marrow edema throughout the T10 and T11 vertebral bodies. Slight loss of height at the endplates. No evidence of epidural abscess. No cord compression.  T11-12: No evidence of involvement at this level.  T12-L1 L1-2:  Normal.  L2-3: New abnormality on the left with a superior endplate Schmorl's node type pattern at L3. This could be evidence of early disc space infection at this level or this could be an actual an related Schmorl's node.  L3-4: Persistent evidence of discitis osteomyelitis at this level with edema in the disc and vertebral bodies but no evidence of progression. No spinal abscess.  L4-5:  No evidence of involvement of this disc space.  L5-S1: Worsened pattern with fluid throughout the disc space and worsened edema within the L5 and S1 vertebral bodies. There appears to be either a left posterior lateral disc herniation with caudal migration or some epidural abscess on the left behind S1. The thecal sac is not compressed. There would be potential to affect the S1 nerve roots.  IMPRESSION: Newly seen discitis osteomyelitis at T10-11. Fluid in the disc space. Early collapse of the endplates. No spinal abscess or neural compression.  Newly seen abnormality on the left at L2-3 which looks like a superior endplate Schmorl's node but could be evidence of early infection developing at the L2-3 disc level.  Persistent osteomyelitis discitis at L3-4, similar to the previous study. No abscess or neural compression.  Marked worsening of the appearance at L5-S1 with fluid throughout the disc space, edema within  the L5 and S1 vertebral bodies, an abnormal material in the ventral epidural space that could be extruded disc material or ventral epidural abscess. The central canal is not compressed but there would be potential to affect the S1 nerve roots.   Electronically Signed   By: Nelson Chimes M.D.   On: 03/23/2014 20:21   Ct Renal Stone Study  03/23/2014   CLINICAL DATA:  Left flank pain.  HIV, cirrhosis, lymphoma  EXAM: CT ABDOMEN AND PELVIS WITHOUT CONTRAST  TECHNIQUE: Multidetector CT imaging of the abdomen and pelvis was  performed following the standard protocol without IV contrast.  COMPARISON:  CT 11/10/2013  FINDINGS: On today's study, neither oral nor IV contrast was administered limiting evaluation of the bowel.  Mild right lower lobe atelectasis with elevated right hemidiaphragm. Left lung base is clear.  Advanced cirrhotic liver. Prior cholecystectomy. Marked splenomegaly unchanged. Spleen measures 17 x 14 x 18 cm. Periportal adenopathy seen on the prior study difficult to evaluate on today's study due to lack of oral and IV contrast. Probably no change.  The kidneys are normal without obstruction or mass. No renal calculi. Urinary bladder normal.  Severe constipation with large amount of stool in the colon. No bowel obstruction. Previously noted thickening of the right colon not identified on today's study. This may have been infectious colitis which has resolved  No free fluid.  No pathologic adenopathy.  Progressive discitis at T10-11 with further bone loss and kyphosis. Progressive discitis and osteomyelitis at L3-4, and L5-S1 with sclerotic changes and endplate erosion. Epidural abscess not evaluated on unenhanced CT.  IMPRESSION: No renal calculi.  Advanced cirrhosis. Marked splenomegaly is unchanged. No free fluid.  Marked constipation.  Progressive discitis and osteomyelitis at T10-11, L3-4, L5-S1.   Electronically Signed   By: Franchot Gallo M.D.   On: 03/23/2014 15:34       Assessment/Plan Active Problems:   AIDS   Abscess in epidural space of lumbar spine   Diskitis   Infectious discitis  Discitis T10-11 /? Epidural abscess at L5-S1  Patient has been on IV cefepime , will continue with cefepime 2 g every 12 hours , we'll also start vancomycin per pharmacy consultation. ED physician Dr. Lacinda Axon called the neurosurgeon on call Dr. Trenton Gammon, who reviewed the MRI films and recommend IV antibiotics with ID consultation. He does not think that patient requires surgery at this time. We'll also continue fluconazole 400 mg by mouth daily. Patient does not have any neurological deficit at this time. Please call infectious disease consultation in a.m.   AIDS - patient is on antiretroviral therapy , we'll continue the home regimen. Also continue PCP prophylaxis with atovaquone,   MAI prophylaxis with Zithromax.  Called and discussed with [facility] Arnoldo Morale at [facility], who has accepted the patient.  CODE STATUS - full code   Family discussion: no family at bedside.    Time Spent on Admission: 60 minutes   [facility] Pager: [phone] 03/23/2014, 10:10 PM  If 7PM-7AM, please contact night-coverage  [email or web address]  Password [email or web address]

## 2014-03-23 NOTE — ED Notes (Signed)
Pt from [facility]. Pt reports left flank pain since Wednesday. Pt denies any gu symptoms, n/v/d. Pt reports pain onset on Wednesday after taking a shower. Pt reports "the pain causes me to loose my breath." pt alert and oriented.

## 2014-03-23 NOTE — ED Notes (Signed)
Pt roomed to room 6 but unable to move pt at this time due to charts being merged.

## 2014-03-23 NOTE — ED Provider Notes (Addendum)
CSN: [id number]     Arrival date & time 03/23/14  1234 History  This chart was scribed for Nat Christen, MD by Steva Colder, ED Scribe. The patient was seen in room APA06/APA06 at 2:23 PM.     Chief Complaint  Patient presents with  . Flank Pain    The history is provided by the patient. No language interpreter was used.  Samuel Schultz is a 38 y.o. male PMH HIV, lymphoma, lower spine diskitis who presents today complaining of left flank pain onset 2 days. He states that his urine has been light brown since June. He states that he has been treated for discitis and an epidural abscess since June with intravenous antibiotics[Cefepime 2 gr q12 hr]. He has been eating and drinking fluids. He denies dysuria, vomiting, fever, chills. He denies h/o kidney stone. Severity is moderate.    Past Medical History  Diagnosis Date  . HIV disease   . Cirrhosis   . PTSD (post-traumatic stress disorder)   . Depressive disorder   . Cancer   . Lymphoma   . Anemia    Past Surgical History  Procedure Laterality Date  . Cholecystectomy    . Appendectomy    . Tee without cardioversion N/A 11/13/2013    Procedure: TRANSESOPHAGEAL ECHOCARDIOGRAM (TEE);  Surgeon: Dorothy Spark, MD;  Location: [facility];  Service: Cardiovascular;  Laterality: N/A;  . Infection of spine     Family History  Problem Relation Age of Onset  . Cancer Maternal Grandmother     breast ca  . Cancer Maternal Grandfather     colon ca   History  Substance Use Topics  . Smoking status: Never Smoker   . Smokeless tobacco: Never Used  . Alcohol Use: No    Review of Systems  Gastrointestinal: Negative for vomiting.  Genitourinary: Positive for flank pain. Negative for dysuria.  All other systems reviewed and are negative.     Allergies  Codeine and Oxycodone  Home Medications   Prior to Admission medications   Medication Sig Start Date End Date Taking? Authorizing Provider  atovaquone (MEPRON) 750 MG/5ML suspension  Take 750 mg by mouth daily.   Yes Historical Provider, MD  buPROPion (WELLBUTRIN XL) 300 MG 24 hr tablet Take 300 mg by mouth daily.   Yes Historical Provider, MD  ceFEPIme 2 g in dextrose 5 % 50 mL Inject 2 g into the vein every 12 (twelve) hours. 02/05/14  Yes Kelvin Cellar, MD  clonazePAM (KLONOPIN) 1 MG tablet Take 1 mg by mouth at bedtime.   Yes Historical Provider, MD  cyclobenzaprine (FLEXERIL) 5 MG tablet Take 1 tablet (5 mg total) by mouth at bedtime. 12/18/13  Yes Geradine Girt, DO  Darunavir Ethanolate (PREZISTA) 800 MG tablet Take 1 tablet (800 mg total) by mouth daily. 01/01/14  Yes Truman Hayward, MD  emtricitabine-tenofovir (TRUVADA) 200-300 MG per tablet Take 1 tablet by mouth at bedtime. 01/01/14  Yes Truman Hayward, MD  ferrous sulfate 325 (65 FE) MG tablet Take 1 tablet (325 mg total) by mouth 2 (two) times daily with a meal. 02/05/14  Yes Kelvin Cellar, MD  fluconazole (DIFLUCAN) 200 MG tablet Take 2 tablets (400 mg total) by mouth daily. 02/05/14  Yes Kelvin Cellar, MD  gabapentin (NEURONTIN) 400 MG capsule Take 400 mg by mouth 3 (three) times daily.   Yes Historical Provider, MD  omeprazole (PRILOSEC) 20 MG capsule Take 20 mg by mouth 2 (two) times daily.  Yes Historical Provider, MD  potassium chloride SA (K-DUR,KLOR-CON) 20 MEQ tablet Take 20 mEq by mouth daily.   Yes Historical Provider, MD  promethazine (PHENERGAN) 12.5 MG tablet Take 1 tablet (12.5 mg total) by mouth every 6 (six) hours as needed for nausea or vomiting. 02/05/14  Yes Kelvin Cellar, MD  ritonavir (NORVIR) 100 MG capsule Take 1 capsule (100 mg total) by mouth daily with breakfast. With Prezista 01/01/14  Yes Truman Hayward, MD  sucralfate (CARAFATE) 1 G tablet Take 1 g by mouth 3 (three) times daily with meals.    Yes Historical Provider, MD  temazepam (RESTORIL) 7.5 MG capsule Take 1 capsule (7.5 mg total) by mouth at bedtime as needed for sleep. 02/06/14  Yes Mahima Pandey, MD  azithromycin  (ZITHROMAX) 600 MG tablet Take 1,200 mg by mouth once a week. saturday    Historical Provider, MD  clonazePAM (KLONOPIN) 0.5 MG tablet Take one tablet by mouth twice daily as needed for anxiety 02/06/14   Blanchie Serve, MD  methocarbamol (ROBAXIN) 500 MG tablet Take 1 tablet (500 mg total) by mouth every 6 (six) hours as needed for muscle spasms. 02/05/14   Kelvin Cellar, MD  morphine (MS CONTIN) 60 MG 12 hr tablet Take 1 tablet (60 mg total) by mouth every 12 (twelve) hours. 03/14/14   Blanchie Serve, MD  morphine (MSIR) 15 MG tablet Take 1 tablet (15 mg total) by mouth every 4 (four) hours as needed for severe pain. 03/14/14   Mahima Pandey, MD   BP 130/74  Pulse 119  Temp(Src) 98.8 F (37.1 C) (Oral)  Resp 36  Ht 5\' 9"  (1.753 m)  Wt 165 lb (74.844 kg)  BMI 24.36 kg/m2  SpO2 97%  Physical Exam  Nursing note and vitals reviewed. Constitutional: He is oriented to person, place, and time. He appears well-developed and well-nourished.  HENT:  Head: Normocephalic and atraumatic.  Eyes: Conjunctivae and EOM are normal. Pupils are equal, round, and reactive to light.  Neck: Normal range of motion. Neck supple.  Cardiovascular: Normal rate, regular rhythm and normal heart sounds.   Pulmonary/Chest: Effort normal and breath sounds normal.  Abdominal: Soft. Bowel sounds are normal.  Musculoskeletal: Normal range of motion. He exhibits tenderness.  Left flank tenderness  Neurological: He is alert and oriented to person, place, and time.  Skin: Skin is warm and dry.  Psychiatric: He has a normal mood and affect. His behavior is normal.    ED Course  Procedures (including critical care time) DIAGNOSTIC STUDIES: Oxygen Saturation is 97% on room air, normal by my interpretation.    COORDINATION OF CARE: 2:28 PM-Discussed treatment plan which includes CT scan of Abdomen and pain medications dilaudid  pt at bedside and pt agreed to plan.   Labs Review Labs Reviewed  CBC WITH DIFFERENTIAL -  Abnormal; Notable for the following:    WBC 2.9 (*)    RBC 2.91 (*)    Hemoglobin 9.7 (*)    HCT 28.0 (*)    RDW 16.3 (*)    Platelets 115 (*)    All other components within normal limits  BASIC METABOLIC PANEL - Abnormal; Notable for the following:    Sodium 133 (*)    Glucose, Bld 106 (*)    Creatinine, Ser 0.43 (*)    All other components within normal limits  URINALYSIS, ROUTINE W REFLEX MICROSCOPIC - Abnormal; Notable for the following:    Color, Urine ORANGE (*)    Ketones, ur TRACE (*)  All other components within normal limits    Imaging Review Ct Renal Stone Study  03/23/2014   CLINICAL DATA:  Left flank pain.  HIV, cirrhosis, lymphoma  EXAM: CT ABDOMEN AND PELVIS WITHOUT CONTRAST  TECHNIQUE: Multidetector CT imaging of the abdomen and pelvis was performed following the standard protocol without IV contrast.  COMPARISON:  CT 11/10/2013  FINDINGS: On today's study, neither oral nor IV contrast was administered limiting evaluation of the bowel.  Mild right lower lobe atelectasis with elevated right hemidiaphragm. Left lung base is clear.  Advanced cirrhotic liver. Prior cholecystectomy. Marked splenomegaly unchanged. Spleen measures 17 x 14 x 18 cm. Periportal adenopathy seen on the prior study difficult to evaluate on today's study due to lack of oral and IV contrast. Probably no change.  The kidneys are normal without obstruction or mass. No renal calculi. Urinary bladder normal.  Severe constipation with large amount of stool in the colon. No bowel obstruction. Previously noted thickening of the right colon not identified on today's study. This may have been infectious colitis which has resolved  No free fluid.  No pathologic adenopathy.  Progressive discitis at T10-11 with further bone loss and kyphosis. Progressive discitis and osteomyelitis at L3-4, and L5-S1 with sclerotic changes and endplate erosion. Epidural abscess not evaluated on unenhanced CT.  IMPRESSION: No renal  calculi.  Advanced cirrhosis. Marked splenomegaly is unchanged. No free fluid.  Marked constipation.  Progressive discitis and osteomyelitis at T10-11, L3-4, L5-S1.   Electronically Signed   By: Franchot Gallo M.D.   On: 03/23/2014 15:34     EKG Interpretation None     CRITICAL CARE Performed by: Nat Christen Total critical care time: 30 Critical care time was exclusive of separately billable procedures and treating other patients. Critical care was necessary to treat or prevent imminent or life-threatening deterioration. Critical care was time spent personally by me on the following activities: development of treatment plan with patient and/or surrogate as well as nursing, discussions with consultants, evaluation of patient's response to treatment, examination of patient, obtaining history from patient or surrogate, ordering and performing treatments and interventions, ordering and review of laboratory studies, ordering and review of radiographic studies, pulse oximetry and re-evaluation of patient's condition. MDM   Final diagnoses:  Left flank pain  HIV (human immunodeficiency virus infection)    Urinalysis shows no evidence of infection. CT scan shows progressive discitis and osteomyelitis at T10-11, L3-4, L5-S1.  Discussed with the radiologist Dr. Wilmer Floor, hospitalist Dr. Darrick Meigs, primary care Dr. Laurene Footman.  Will get MRI of lumbar spine   2140:  Discussed MRI findings with neurosurgeon Dr. Trenton Gammon.  No surgical intervention at this moment.  Will transfer to [facility] via the hospitalist Dr Darrick Meigs  I personally performed the services described in this documentation, which was scribed in my presence. The recorded information has been reviewed and is accurate.    Nat Christen, MD 03/23/14 [place], MD 03/23/14 2153

## 2014-03-24 ENCOUNTER — Encounter (HOSPITAL_COMMUNITY): Payer: Self-pay | Admitting: Emergency Medicine

## 2014-03-24 ENCOUNTER — Encounter: Payer: Self-pay | Admitting: Infectious Disease

## 2014-03-24 DIAGNOSIS — M4644 Discitis, unspecified, thoracic region: Secondary | ICD-10-CM

## 2014-03-24 DIAGNOSIS — Z2239 Carrier of other specified bacterial diseases: Secondary | ICD-10-CM

## 2014-03-24 LAB — COMPREHENSIVE METABOLIC PANEL
ALBUMIN: 2 g/dL — AB (ref 3.5–5.2)
ALK PHOS: 112 U/L (ref 39–117)
ALT: 11 U/L (ref 0–53)
AST: 21 U/L (ref 0–37)
Anion gap: 8 (ref 5–15)
BILIRUBIN TOTAL: 1.1 mg/dL (ref 0.3–1.2)
BUN: 9 mg/dL (ref 6–23)
CHLORIDE: 104 meq/L (ref 96–112)
CO2: 23 mEq/L (ref 19–32)
Calcium: 8.4 mg/dL (ref 8.4–10.5)
Creatinine, Ser: 0.44 mg/dL — ABNORMAL LOW (ref 0.50–1.35)
GFR calc Af Amer: >90 mL/min (ref >90)
Glucose, Bld: 89 mg/dL (ref 70–99)
POTASSIUM: 4 meq/L (ref 3.7–5.3)
SODIUM: 135 meq/L — AB (ref 137–147)
Total Protein: 7.6 g/dL (ref 6.0–8.3)

## 2014-03-24 LAB — HEPATITIS C ANTIBODY: HCV Ab: NEGATIVE

## 2014-03-24 LAB — CBC
HCT: 25.7 % — ABNORMAL LOW (ref 39.0–52.0)
HEMOGLOBIN: 8.6 g/dL — AB (ref 13.0–17.0)
MCH: 33.2 pg (ref 26.0–34.0)
MCHC: 33.5 g/dL (ref 30.0–36.0)
MCV: 99.2 fL (ref 78.0–100.0)
Platelets: 82 10*3/uL — ABNORMAL LOW (ref 150–400)
RBC: 2.59 MIL/uL — AB (ref 4.22–5.81)
RDW: 16 % — ABNORMAL HIGH (ref 11.5–15.5)
WBC: 2.2 10*3/uL — ABNORMAL LOW (ref 4.0–10.5)

## 2014-03-24 LAB — SURGICAL PCR SCREEN
MRSA, PCR: NEGATIVE
Staphylococcus aureus: NEGATIVE

## 2014-03-24 MED ORDER — ONDANSETRON HCL 4 MG/2ML IJ SOLN
4.0000 mg | Freq: Four times a day (QID) | INTRAMUSCULAR | Status: DC | PRN
  Administered 2014-03-28 – 2014-04-03 (×4): 4 mg via INTRAVENOUS
  Filled 2014-03-24 (×4): qty 2

## 2014-03-24 MED ORDER — ENOXAPARIN SODIUM 40 MG/0.4ML ~~LOC~~ SOLN
40.0000 mg | SUBCUTANEOUS | Status: DC
  Administered 2014-03-28 – 2014-04-14 (×17): 40 mg via SUBCUTANEOUS
  Filled 2014-03-24 (×23): qty 0.4

## 2014-03-24 MED ORDER — SODIUM CHLORIDE 0.9 % IV SOLN
INTRAVENOUS | Status: DC
  Administered 2014-03-24: 03:00:00 via INTRAVENOUS

## 2014-03-24 MED ORDER — ATOVAQUONE 750 MG/5ML PO SUSP
750.0000 mg | Freq: Every day | ORAL | Status: DC
  Administered 2014-03-30: 750 mg via ORAL
  Filled 2014-03-24 (×28): qty 5

## 2014-03-24 MED ORDER — FLUCONAZOLE 200 MG PO TABS
400.0000 mg | ORAL_TABLET | Freq: Every day | ORAL | Status: DC
  Administered 2014-03-24: 400 mg via ORAL
  Filled 2014-03-24: qty 2

## 2014-03-24 MED ORDER — SUCRALFATE 1 G PO TABS
1.0000 g | ORAL_TABLET | Freq: Three times a day (TID) | ORAL | Status: DC
  Administered 2014-03-24 – 2014-04-23 (×82): 1 g via ORAL
  Filled 2014-03-24 (×95): qty 1

## 2014-03-24 MED ORDER — DARUNAVIR ETHANOLATE 800 MG PO TABS
800.0000 mg | ORAL_TABLET | Freq: Every day | ORAL | Status: DC
  Administered 2014-03-24 – 2014-04-23 (×29): 800 mg via ORAL
  Filled 2014-03-24 (×34): qty 1

## 2014-03-24 MED ORDER — EMTRICITABINE-TENOFOVIR DF 200-300 MG PO TABS
1.0000 | ORAL_TABLET | Freq: Every day | ORAL | Status: DC
  Administered 2014-03-24 – 2014-04-22 (×30): 1 via ORAL
  Filled 2014-03-24 (×33): qty 1

## 2014-03-24 MED ORDER — ENSURE COMPLETE PO LIQD
237.0000 mL | Freq: Two times a day (BID) | ORAL | Status: DC
  Administered 2014-03-25 – 2014-04-23 (×38): 237 mL via ORAL

## 2014-03-24 MED ORDER — TEMAZEPAM 7.5 MG PO CAPS
7.5000 mg | ORAL_CAPSULE | Freq: Every evening | ORAL | Status: DC | PRN
  Filled 2014-03-24: qty 1

## 2014-03-24 MED ORDER — BUPROPION HCL ER (XL) 300 MG PO TB24
300.0000 mg | ORAL_TABLET | Freq: Every day | ORAL | Status: DC
  Administered 2014-03-24 – 2014-04-23 (×30): 300 mg via ORAL
  Filled 2014-03-24 (×32): qty 1

## 2014-03-24 MED ORDER — RITONAVIR 100 MG PO CAPS
100.0000 mg | ORAL_CAPSULE | Freq: Every day | ORAL | Status: DC
  Administered 2014-03-24 – 2014-04-23 (×29): 100 mg via ORAL
  Filled 2014-03-24 (×35): qty 1

## 2014-03-24 MED ORDER — GABAPENTIN 400 MG PO CAPS
400.0000 mg | ORAL_CAPSULE | Freq: Three times a day (TID) | ORAL | Status: DC
  Administered 2014-03-24 – 2014-04-23 (×88): 400 mg via ORAL
  Filled 2014-03-24 (×94): qty 1

## 2014-03-24 MED ORDER — ONDANSETRON HCL 4 MG PO TABS
4.0000 mg | ORAL_TABLET | Freq: Four times a day (QID) | ORAL | Status: DC | PRN
  Administered 2014-04-03: 4 mg via ORAL
  Filled 2014-03-24 (×2): qty 1

## 2014-03-24 MED ORDER — MORPHINE SULFATE ER 30 MG PO TBCR
60.0000 mg | EXTENDED_RELEASE_TABLET | Freq: Two times a day (BID) | ORAL | Status: DC
  Administered 2014-03-24 – 2014-03-28 (×9): 60 mg via ORAL
  Filled 2014-03-24 (×9): qty 2

## 2014-03-24 MED ORDER — AZITHROMYCIN 600 MG PO TABS
1200.0000 mg | ORAL_TABLET | ORAL | Status: DC
  Administered 2014-03-24 – 2014-04-07 (×3): 1200 mg via ORAL
  Filled 2014-03-24 (×3): qty 2

## 2014-03-24 MED ORDER — METHOCARBAMOL 500 MG PO TABS
500.0000 mg | ORAL_TABLET | Freq: Four times a day (QID) | ORAL | Status: DC | PRN
  Administered 2014-03-24 – 2014-04-10 (×6): 500 mg via ORAL
  Filled 2014-03-24 (×10): qty 1

## 2014-03-24 MED ORDER — SODIUM CHLORIDE 0.9 % IV SOLN
150.0000 mg | Freq: Every day | INTRAVENOUS | Status: DC
  Administered 2014-03-24 – 2014-04-10 (×18): 150 mg via INTRAVENOUS
  Filled 2014-03-24 (×20): qty 150

## 2014-03-24 MED ORDER — DEXTROSE 5 % IV SOLN
2.0000 g | Freq: Two times a day (BID) | INTRAVENOUS | Status: DC
  Administered 2014-03-24: 2 g via INTRAVENOUS
  Filled 2014-03-24 (×2): qty 2

## 2014-03-24 MED ORDER — SODIUM CHLORIDE 0.9 % IV SOLN
630.0000 mg | INTRAVENOUS | Status: DC
  Administered 2014-03-25 – 2014-04-10 (×17): 630 mg via INTRAVENOUS
  Filled 2014-03-24 (×31): qty 12.6

## 2014-03-24 MED ORDER — CLONAZEPAM 1 MG PO TABS
1.0000 mg | ORAL_TABLET | Freq: Every day | ORAL | Status: DC
  Administered 2014-03-24 – 2014-04-22 (×31): 1 mg via ORAL
  Filled 2014-03-24 (×31): qty 1

## 2014-03-24 MED ORDER — SODIUM CHLORIDE 0.9 % IV SOLN
630.0000 mg | Freq: Once | INTRAVENOUS | Status: AC
  Administered 2014-03-24: 630 mg via INTRAVENOUS
  Filled 2014-03-24: qty 12.6

## 2014-03-24 NOTE — Consult Note (Signed)
[facility] for [facility] Disease     Reason for Consult: discitis    Referring Physician: Dr. Hartford Poli  Active Problems:   AIDS   Abscess in epidural space of lumbar spine   Diskitis   Infectious discitis   . atovaquone  750 mg Oral Daily  . azithromycin  1,200 mg Oral Q Sat  . buPROPion  300 mg Oral Daily  . ceFEPIme (MAXIPIME) 2 GM IVP  2 g Intravenous Q12H  . clonazePAM  1 mg Oral QHS  . Darunavir Ethanolate  800 mg Oral Q breakfast  . emtricitabine-tenofovir  1 tablet Oral QHS  . enoxaparin (LOVENOX) injection  40 mg Subcutaneous Q24H  . fluconazole  400 mg Oral Daily  . gabapentin  400 mg Oral TID  . morphine  60 mg Oral Q12H  . ritonavir  100 mg Oral Q breakfast  . sucralfate  1 g Oral TID WC    Recommendations: Change fluconazole to micafungin Continue daptomycin Stop cefepime  Check CD4 and vl I agree that aspiration would offer little at this point  Dr. Baxter Flattery to follow up on Monday  Assessment: He has a new area of discitis at T10-11 including fluid in the disc space, some collapse of endplates and worsening of L5-S1.  I am concerned that with his low CD4 count, cirrhosis, lymphoma, that it is simply not improving despite appropriate antibiotics.  He has albicans in the disc space and also from previous blood culture and VRE from previous blood culture.  If this doesn't help in the coming weeks he may need evaluation for lumbar surgical intervention.    Antibiotics: Long course of cefepime, daptomycin, fluconazole  HPI: Samuel Schultz is a 38 y.o. male with history of lymphoma, AIDS, cirrhosis of the liver who had presented in June with N/V and fever and cultures grew VRE, C albicans.  He was treated with daptomycin and fluconazole for 14 days then returned in July with fever and chills, diagnosed with L3-4 discitis.  No other positive culture grown and sent out on daptomycin and ceftazidime.  He though has had continued problems, and returned in August  and aspiration cultured C albicans.  Sent out on fluconazole, daptomycin, cefepime.  Saw Dr. Tommy Medal in clinic to continue with the same.  Now comes in with worsening pain and new area noted on MRI in T10-11 and worsening L5-S1.     Review of Systems: A comprehensive review of systems was negative.  Past Medical History  Diagnosis Date  . HIV disease   . Cirrhosis   . PTSD (post-traumatic stress disorder)   . Depressive disorder   . Cancer   . Lymphoma   . Anemia     History  Substance Use Topics  . Smoking status: Never Smoker   . Smokeless tobacco: Never Used  . Alcohol Use: No    Family History  Problem Relation Age of Onset  . Cancer Maternal Grandmother     breast ca  . Cancer Maternal Grandfather     colon ca   Allergies  Allergen Reactions  . Codeine Anaphylaxis, Hives, Nausea And Vomiting, Nausea Only and Swelling  . Oxycodone Other (See Comments)    Other reaction(s): Dizziness Lightheaded, nausea    OBJECTIVE: Blood pressure 107/50, pulse 105, temperature 98.9 F (37.2 C), temperature source Oral, resp. rate 18, height 5\' 8"  (1.727 m), weight 174 lb 9.6 oz (79.198 kg), SpO2 97.00%. General: sleeping, nad, arousable Skin: no rashes Lungs: CTA B  Cor: RRR Abdomen: soft, nt   Microbiology: Recent Results (from the past 240 hour(s))  SURGICAL PCR SCREEN     Status: None   Collection Time    03/24/14  1:51 AM      Result Value Ref Range Status   MRSA, PCR NEGATIVE  NEGATIVE Final   Staphylococcus aureus NEGATIVE  NEGATIVE Final   Comment:            The Xpert SA Assay (FDA     approved for NASAL specimens     in patients over 29 years of age),     is one component of     a comprehensive surveillance     program.  Test performance has     been validated by [employer] for patients greater     than or equal to 39 year old.     It is not intended     to diagnose infection nor to     guide or monitor treatment.    Scharlene Gloss, [facility] [place] [email or web address] [phone] pager  [phone] cell 03/24/2014, 12:33 PM

## 2014-03-24 NOTE — Progress Notes (Signed)
NURSING PROGRESS NOTE  Samuel Schultz [id number] Admission Data: 03/24/2014 12:54 AM Attending Provider: Theressa Millard, MD DIY:MEBR, PAMELA, MD Code Status: Full  Samuel Schultz is a 38 y.o. male patient admitted from ED:  -No acute distress noted.  -No complaints of shortness of breath.  -No complaints of chest pain.   Blood pressure 114/69, pulse 113, temperature 98.9 F (37.2 C), temperature source Oral, resp. rate 18, height 5\' 9"  (1.753 m), weight 74.844 kg (165 lb), SpO2 100.00%.  IV Fluids:  PICC in place, occlusive dsg intact without redness, PICC upper arm right, condition patent and no redness normal saline @ 75 ml/hr  Allergies:  Codeine and Oxycodone  Past Medical History:   has a past medical history of HIV disease; Cirrhosis; PTSD (post-traumatic stress disorder); Depressive disorder; Cancer; Lymphoma; and Anemia.  Past Surgical History:   has past surgical history that includes Cholecystectomy; Appendectomy; TEE without cardioversion (N/A, 11/13/2013); and infection of spine.  Social History:   reports that he has never smoked. He has never used smokeless tobacco. He reports that he does not drink alcohol or use illicit drugs.  Skin: sacrum/buttocks reddened but blanchable  Patient/Family orientated to room. Information packet given to patient/family. Admission inpatient armband information verified with patient/family to include name and date of birth and placed on patient arm. Side rails up x 1, fall assessment and education completed with patient/family. Patient/family able to verbalize understanding of risk associated with falls and verbalized understanding to call for assistance before getting out of bed. Call light within reach. Patient/family able to voice and demonstrate understanding of unit orientation instructions.

## 2014-03-24 NOTE — Progress Notes (Signed)
[facility] PROGRESS NOTE   Nas Wafer WJX:[id number] DOB: [birth year] DOA: 03/23/2014 PCP: [name], MD  HPI/Subjective: Complaining about significant pain in his back. Denies high fever or chills.  Assessment/Plan: Principal Problem:   Diskitis Active Problems:   AIDS   Cirrhosis, non-alcoholic   Pancytopenia   Protein-calorie malnutrition, severe   Abscess in epidural space of lumbar spine   Infectious discitis     Acute discitis -Patient discharged from the hospital last time with L3-4 discitis, came in with no discitis at T10-11 level. -Patient had VRE, ampicillin sensitive enterococcus and Candida albicans grew from blood. -As far as culture before nothing grew out of the disc aspirate. -Discussed with both ID and IR, aspirating the disc spaces not going to change management now, will hold. -Patient discharged on cefepime, daptomycin and fluconazole. -Seen by Dr. Linus Salmons of ID, placed on Micafungin and daptomycin.  AIDS/HIV infection -Absolute CD4 count is 40 on 01/27/14. -Patient is on Truvada, Prezista and Norvir. -Azithromycin for prophylaxis.  Cirrhosis -Radiological evidence of cirrhosis and portal hypertension, with massive splenomegaly and esophageal varices. -His INR is 1.86. Normal LFTs. -Check HCV antibodies.  Pancytopenia -Including leukopenia, anemia and thrombocytopenia. -Likely multifactorial from bone marrow suppression from AIDS/cirrhosis and hypersplenism.  Severe protein calorie malnutrition -Dietitian to see, his BMI is in the overweight range, his albumin is at 2.0.  Code Status: Full code Family Communication: Plan discussed with the patient. Disposition Plan: Remains inpatient   Consultants:  Infectious disease.  Procedures:  None  Antibiotics:  Micafungin 10/17.  Daptomycin continued since discharge last time.   Objective: Filed Vitals:   03/24/14 0623  BP: 107/50  Pulse:   Temp:   Resp:    No intake  or output data in the 24 hours ending 03/24/14 1400 Filed Weights   03/23/14 1241 03/24/14 0057  Weight: 74.844 kg (165 lb) 79.198 kg (174 lb 9.6 oz)    Exam: General: Alert and awake, oriented x3, not in any acute distress. HEENT: anicteric sclera, pupils reactive to light and accommodation, EOMI CVS: S1-S2 clear, no murmur rubs or gallops Chest: clear to auscultation bilaterally, no wheezing, rales or rhonchi Abdomen: soft nontender, nondistended, normal bowel sounds, no organomegaly Extremities: no cyanosis, clubbing or edema noted bilaterally Neuro: Cranial nerves II-XII intact, no focal neurological deficits  Data Reviewed: Basic Metabolic Panel:  Recent Labs Lab 03/23/14 1403 03/24/14 0145  NA 133* 135*  K 4.2 4.0  CL 104 104  CO2 21 23  GLUCOSE 106* 89  BUN 11 9  CREATININE 0.43* 0.44*  CALCIUM 8.8 8.4   Liver Function Tests:  Recent Labs Lab 03/24/14 0145  AST 21  ALT 11  ALKPHOS 112  BILITOT 1.1  PROT 7.6  ALBUMIN 2.0*   No results found for this basename: LIPASE, AMYLASE,  in the last 168 hours No results found for this basename: AMMONIA,  in the last 168 hours CBC:  Recent Labs Lab 03/23/14 1403 03/24/14 0145  WBC 2.9* 2.2*  NEUTROABS 1.7  --   HGB 9.7* 8.6*  HCT 28.0* 25.7*  MCV 96.2 99.2  PLT 115* 82*   Cardiac Enzymes: No results found for this basename: CKTOTAL, CKMB, CKMBINDEX, TROPONINI,  in the last 168 hours BNP (last 3 results) No results found for this basename: PROBNP,  in the last 8760 hours CBG: No results found for this basename: GLUCAP,  in the last 168 hours  Micro Recent Results (from the past 240 hour(s))  SURGICAL PCR SCREEN  Status: None   Collection Time    03/24/14  1:51 AM      Result Value Ref Range Status   MRSA, PCR NEGATIVE  NEGATIVE Final   Staphylococcus aureus NEGATIVE  NEGATIVE Final   Comment:            The Xpert SA Assay (FDA     approved for NASAL specimens     in patients over 21 years of  age),     is one component of     a comprehensive surveillance     program.  Test performance has     been validated by [employer] for patients greater     than or equal to 34 year old.     It is not intended     to diagnose infection nor to     guide or monitor treatment.     Studies: Mr Lumbar Spine Wo Contrast  03/23/2014   CLINICAL DATA:  Multilevel infectious discitis. HIV infection. Lymphoma. Left flank pain of 2 days duration.  EXAM: MRI LUMBAR SPINE WITHOUT CONTRAST  TECHNIQUE: Multiplanar, multisequence MR imaging of the lumbar spine was performed. No intravenous contrast was administered.  COMPARISON:  CT scan same day.  Previous MRI 01/26/2014  FINDINGS: The scan covers the region from lower T9 through S3.  T9-10 does not appear involved.  T10-11: Discitis osteomyelitis at this level with fluid intensity material in the disc space and marrow edema throughout the T10 and T11 vertebral bodies. Slight loss of height at the endplates. No evidence of epidural abscess. No cord compression.  T11-12: No evidence of involvement at this level.  T12-L1 L1-2:  Normal.  L2-3: New abnormality on the left with a superior endplate Schmorl's node type pattern at L3. This could be evidence of early disc space infection at this level or this could be an actual an related Schmorl's node.  L3-4: Persistent evidence of discitis osteomyelitis at this level with edema in the disc and vertebral bodies but no evidence of progression. No spinal abscess.  L4-5:  No evidence of involvement of this disc space.  L5-S1: Worsened pattern with fluid throughout the disc space and worsened edema within the L5 and S1 vertebral bodies. There appears to be either a left posterior lateral disc herniation with caudal migration or some epidural abscess on the left behind S1. The thecal sac is not compressed. There would be potential to affect the S1 nerve roots.  IMPRESSION: Newly seen discitis osteomyelitis at T10-11. Fluid  in the disc space. Early collapse of the endplates. No spinal abscess or neural compression.  Newly seen abnormality on the left at L2-3 which looks like a superior endplate Schmorl's node but could be evidence of early infection developing at the L2-3 disc level.  Persistent osteomyelitis discitis at L3-4, similar to the previous study. No abscess or neural compression.  Marked worsening of the appearance at L5-S1 with fluid throughout the disc space, edema within the L5 and S1 vertebral bodies, an abnormal material in the ventral epidural space that could be extruded disc material or ventral epidural abscess. The central canal is not compressed but there would be potential to affect the S1 nerve roots.   Electronically Signed   By: Nelson Chimes M.D.   On: 03/23/2014 20:21   Ct Renal Stone Study  03/23/2014   CLINICAL DATA:  Left flank pain.  HIV, cirrhosis, lymphoma  EXAM: CT ABDOMEN AND PELVIS WITHOUT CONTRAST  TECHNIQUE:  Multidetector CT imaging of the abdomen and pelvis was performed following the standard protocol without IV contrast.  COMPARISON:  CT 11/10/2013  FINDINGS: On today's study, neither oral nor IV contrast was administered limiting evaluation of the bowel.  Mild right lower lobe atelectasis with elevated right hemidiaphragm. Left lung base is clear.  Advanced cirrhotic liver. Prior cholecystectomy. Marked splenomegaly unchanged. Spleen measures 17 x 14 x 18 cm. Periportal adenopathy seen on the prior study difficult to evaluate on today's study due to lack of oral and IV contrast. Probably no change.  The kidneys are normal without obstruction or mass. No renal calculi. Urinary bladder normal.  Severe constipation with large amount of stool in the colon. No bowel obstruction. Previously noted thickening of the right colon not identified on today's study. This may have been infectious colitis which has resolved  No free fluid.  No pathologic adenopathy.  Progressive discitis at T10-11 with  further bone loss and kyphosis. Progressive discitis and osteomyelitis at L3-4, and L5-S1 with sclerotic changes and endplate erosion. Epidural abscess not evaluated on unenhanced CT.  IMPRESSION: No renal calculi.  Advanced cirrhosis. Marked splenomegaly is unchanged. No free fluid.  Marked constipation.  Progressive discitis and osteomyelitis at T10-11, L3-4, L5-S1.   Electronically Signed   By: Franchot Gallo M.D.   On: 03/23/2014 15:34    Scheduled Meds: . atovaquone  750 mg Oral Daily  . azithromycin  1,200 mg Oral Q Sat  . buPROPion  300 mg Oral Daily  . clonazePAM  1 mg Oral QHS  . [START ON 03/25/2014] DAPTOmycin (CUBICIN)  IV  630 mg Intravenous Q24H  . Darunavir Ethanolate  800 mg Oral Q breakfast  . emtricitabine-tenofovir  1 tablet Oral QHS  . enoxaparin (LOVENOX) injection  40 mg Subcutaneous Q24H  . gabapentin  400 mg Oral TID  . micafungin [facility]) IV  150 mg Intravenous Daily  . morphine  60 mg Oral Q12H  . ritonavir  100 mg Oral Q breakfast  . sucralfate  1 g Oral TID WC   Continuous Infusions: . sodium chloride 75 mL/hr at 03/24/14 0236       Time spent: 35 minutes    [facility] Pager [phone] If 7PM-7AM, please contact night-coverage at [email or web address], password [facility] 03/24/2014, 2:00 PM  LOS: 1 day

## 2014-03-24 NOTE — Progress Notes (Signed)
INITIAL NUTRITION ASSESSMENT  DOCUMENTATION CODES Per approved criteria  -Severe malnutrition in the context of chronic illness  Pt meets criteria for severe MALNUTRITION in the context of chronic illness as evidenced by PO intake <75% for > one month, 18% body weight loss in 6 month, moderate muscle wasting and subcutaneous fat loss .   INTERVENTION: -Recommend Ensure Complete po BID, each supplement provides 350 kcal and 13 grams of protein -Encouraged intake of healthy protein sources/snacks for weight management/lean body mass maintenance -RD to continue to monitor  NUTRITION DIAGNOSIS: Unintentional wt loss related to chronic disease as evidenced by >30 lbs weight loss in 6 months.   Goal: Pt to meet >/= 90% of their estimated nutrition needs    Monitor:  Total protein/energy intake, labs, weights  Reason for Assessment: MST  38 y.o. male  Admitting Dx: Diskitis  ASSESSMENT: 38 year old male who has a past medical history of HIV disease; Cirrhosis; PTSD (post-traumatic stress disorder); Depressive disorder; Cancer; Lymphoma; and Anemia. patient was admitted to [facility] in August with enterococcal bacteremia but then found to have VRE and Candida started on fluconazole and vancomycin for 14 days. At that time he had MRI done for the back pain which was negative but then which progress to discitis and vertebral osteomyelitis. At that time patient was not found to be a good operative candidate and was discharged to a nursing center with 8 weeks of Cubicin and cefepime, with fluconazole. Patient has been getting IV cefepime at this time, and currently residing at the [place] center.  Today patient came to the hospital with chief complaint of left-sided flank pain  -Pt endorsed an unintentional wt loss of 35 lbs, previous medical records indicates this has occurred in past 6 months (18% body weight loss, severe for time frame) -Has been residing at [facility], which  is responsible for providing meals -Diet recall indicates pt consumes bagel or cereal and yogurt for breakfast, a cold plate of chicken or tuna salad for lunch, and some type of sandwich for dinner. -Noted to have had decrease in appetite in past several months; endorsed feelings of nausea, and has been experiencing large amount of paint that has also likely impacted PO intake.  -Hx of chronic diseases have also likely contributed to weight loss -Discussed importance of nutrition for proper recovery in time of illness. Was willing to trial Ensure Complete, and was agreeable to peanut butter cracker snacks occasionally. -Pt with moderate fat loss in upper arm region, and moderate muscle wasting in clavicle regions and lower extremities  Height: Ht Readings from Last 1 Encounters:  03/24/14 5\' 8"  (1.727 m)    Weight: Wt Readings from Last 1 Encounters:  03/24/14 174 lb 9.6 oz (79.198 kg)    Ideal Body Weight: 154 lbs  % Ideal Body Weight: 113%  Wt Readings from Last 10 Encounters:  03/24/14 174 lb 9.6 oz (79.198 kg)  02/19/14 167 lb 12 oz (76.091 kg)  02/05/14 178 lb 12.7 oz (81.1 kg)  01/05/14 194 lb (87.998 kg)  01/04/14 194 lb (87.998 kg)  12/10/13 195 lb (88.451 kg)  12/05/13 193 lb 4.8 oz (87.68 kg)  11/23/13 218 lb 8 oz (99.111 kg)  11/23/13 218 lb 8 oz (99.111 kg)  10/16/13 212 lb 1.6 oz (96.208 kg)    Usual Body Weight: 212 lb  % Usual Body Weight: 82%  BMI:  Body mass index is 26.55 kg/(m^2).  Estimated Nutritional Needs: Kcal: 2200-2400 Protein: 1-5-115 gram Fluid: >/=  2200 ml daily  Skin: WDL  Diet Order: General  EDUCATION NEEDS: -Education needs addressed  No intake or output data in the 24 hours ending 03/24/14 1721  Last BM: 10/13   Labs:   Recent Labs Lab 03/23/14 1403 03/24/14 0145  NA 133* 135*  K 4.2 4.0  CL 104 104  CO2 21 23  BUN 11 9  CREATININE 0.43* 0.44*  CALCIUM 8.8 8.4  GLUCOSE 106* 89    CBG (last 3)  No results found  for this basename: GLUCAP,  in the last 72 hours  Scheduled Meds: . atovaquone  750 mg Oral Daily  . azithromycin  1,200 mg Oral Q Sat  . buPROPion  300 mg Oral Daily  . clonazePAM  1 mg Oral QHS  . [START ON 03/25/2014] DAPTOmycin (CUBICIN)  IV  630 mg Intravenous Q24H  . Darunavir Ethanolate  800 mg Oral Q breakfast  . emtricitabine-tenofovir  1 tablet Oral QHS  . enoxaparin (LOVENOX) injection  40 mg Subcutaneous Q24H  . gabapentin  400 mg Oral TID  . micafungin [facility]) IV  150 mg Intravenous Daily  . morphine  60 mg Oral Q12H  . ritonavir  100 mg Oral Q breakfast  . sucralfate  1 g Oral TID WC    Continuous Infusions: . sodium chloride 75 mL/hr at 03/24/14 0236    Past Medical History  Diagnosis Date  . HIV disease   . Cirrhosis   . PTSD (post-traumatic stress disorder)   . Depressive disorder   . Cancer   . Lymphoma   . Anemia     Past Surgical History  Procedure Laterality Date  . Cholecystectomy    . Appendectomy    . Tee without cardioversion N/A 11/13/2013    Procedure: TRANSESOPHAGEAL ECHOCARDIOGRAM (TEE);  Surgeon: Dorothy Spark, MD;  Location: [place];  Service: Cardiovascular;  Laterality: N/A;  . Infection of spine      [place] LDN Clinical Dietitian [phone]

## 2014-03-25 DIAGNOSIS — C8510 Unspecified B-cell lymphoma, unspecified site: Secondary | ICD-10-CM

## 2014-03-25 DIAGNOSIS — K7460 Unspecified cirrhosis of liver: Secondary | ICD-10-CM

## 2014-03-25 LAB — COMPREHENSIVE METABOLIC PANEL
ALK PHOS: 104 U/L (ref 39–117)
ALT: 11 U/L (ref 0–53)
AST: 22 U/L (ref 0–37)
Albumin: 2 g/dL — ABNORMAL LOW (ref 3.5–5.2)
Anion gap: 7 (ref 5–15)
BUN: 8 mg/dL (ref 6–23)
CHLORIDE: 106 meq/L (ref 96–112)
CO2: 22 meq/L (ref 19–32)
Calcium: 8.4 mg/dL (ref 8.4–10.5)
Creatinine, Ser: 0.42 mg/dL — ABNORMAL LOW (ref 0.50–1.35)
GLUCOSE: 95 mg/dL (ref 70–99)
POTASSIUM: 4.1 meq/L (ref 3.7–5.3)
SODIUM: 135 meq/L — AB (ref 137–147)
Total Bilirubin: 1.1 mg/dL (ref 0.3–1.2)
Total Protein: 7.4 g/dL (ref 6.0–8.3)

## 2014-03-25 LAB — CBC
HCT: 23.7 % — ABNORMAL LOW (ref 39.0–52.0)
Hemoglobin: 8.1 g/dL — ABNORMAL LOW (ref 13.0–17.0)
MCH: 32.9 pg (ref 26.0–34.0)
MCHC: 34.2 g/dL (ref 30.0–36.0)
MCV: 96.3 fL (ref 78.0–100.0)
PLATELETS: 78 10*3/uL — AB (ref 150–400)
RBC: 2.46 MIL/uL — AB (ref 4.22–5.81)
RDW: 16 % — ABNORMAL HIGH (ref 11.5–15.5)
WBC: 2.2 10*3/uL — ABNORMAL LOW (ref 4.0–10.5)

## 2014-03-25 LAB — PROTIME-INR
INR: 1.46 (ref 0.00–1.49)
PROTHROMBIN TIME: 17.9 s — AB (ref 11.6–15.2)

## 2014-03-25 LAB — CK: CK TOTAL: 21 U/L (ref 7–232)

## 2014-03-25 MED ORDER — SODIUM CHLORIDE 0.9 % IJ SOLN
10.0000 mL | INTRAMUSCULAR | Status: DC | PRN
  Administered 2014-03-25 – 2014-04-16 (×16): 10 mL
  Filled 2014-03-25 (×9): qty 40

## 2014-03-25 NOTE — Progress Notes (Signed)
[facility] PROGRESS NOTE   Samuel Schultz [id number]:[id number] DOB: Oct 23, 1975 DOA: 03/23/2014 PCP: [name], MD  HPI/Subjective: Still has significant pain, denies any other complaints.  Assessment/Plan: Principal Problem:   Diskitis Active Problems:   AIDS   Cirrhosis, non-alcoholic   Pancytopenia   Protein-calorie malnutrition, severe   Abscess in epidural space of lumbar spine   Infectious discitis     Acute discitis -Patient discharged from the hospital last time with L3-4 discitis, came in with no discitis at T10-11 level. -Patient had VRE, ampicillin sensitive enterococcus and Candida albicans grew from blood. -As far as culture before nothing grew out of the disc aspirate. -Discussed with both ID and IR, aspirating the disc spaces not going to change management now, will hold. -Patient discharged on cefepime, daptomycin and fluconazole. -Seen by Dr. Linus Salmons of ID, placed on Micafungin and daptomycin.  AIDS/HIV infection -Absolute CD4 count is 40 on 01/27/14. -Patient is on Truvada, Prezista and Norvir. -Azithromycin for prophylaxis.  Cirrhosis -Radiological evidence of cirrhosis and portal hypertension, with massive splenomegaly and esophageal varices. -His INR is 1.86. Normal LFTs. Hep C antibody is negative. -Reportedly cirrhosis secondary to previous lymphoma infiltration to the liver and R-CHOP chemotherapy.  Pancytopenia -Including leukopenia, anemia and thrombocytopenia. -Likely multifactorial from bone marrow suppression from AIDS/cirrhosis and hypersplenism.  Severe protein calorie malnutrition -Dietitian to see, his BMI is in the overweight range, his albumin is at 2.0.  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 -Had bone marrow biopsy done on 11/02/2013 in the [facility], showed no evidence of recurrent disease  Code Status: Full code Family Communication: Plan discussed with the  patient. Disposition Plan: Remains inpatient   Consultants:  Infectious disease.  Procedures:  None  Antibiotics:  Micafungin 10/17.  Daptomycin continued since discharge last time.   Objective: Filed Vitals:   03/25/14 0522  BP: 96/62  Pulse: 98  Temp: 99.4 F (37.4 C)  Resp: 18    Intake/Output Summary (Last 24 hours) at 03/25/14 1246 Last data filed at 03/25/14 1100  Gross per 24 hour  Intake    600 ml  Output    200 ml  Net    400 ml   Filed Weights   03/23/14 1241 03/24/14 0057  Weight: 74.844 kg (165 lb) 79.198 kg (174 lb 9.6 oz)    Exam: General: Alert and awake, oriented x3, not in any acute distress. HEENT: anicteric sclera, pupils reactive to light and accommodation, EOMI CVS: S1-S2 clear, no murmur rubs or gallops Chest: clear to auscultation bilaterally, no wheezing, rales or rhonchi Abdomen: soft nontender, nondistended, normal bowel sounds, no organomegaly Extremities: no cyanosis, clubbing or edema noted bilaterally Neuro: Cranial nerves II-XII intact, no focal neurological deficits  Data Reviewed: Basic Metabolic Panel:  Recent Labs Lab 03/23/14 1403 03/24/14 0145 03/25/14 0514  NA 133* 135* 135*  K 4.2 4.0 4.1  CL 104 104 106  CO2 21 23 22   GLUCOSE 106* 89 95  BUN 11 9 8   CREATININE 0.43* 0.44* 0.42*  CALCIUM 8.8 8.4 8.4   Liver Function Tests:  Recent Labs Lab 03/24/14 0145 03/25/14 0514  AST 21 22  ALT 11 11  ALKPHOS 112 104  BILITOT 1.1 1.1  PROT 7.6 7.4  ALBUMIN 2.0* 2.0*   No results found for this basename: LIPASE, AMYLASE,  in the last 168 hours No results found for this basename: AMMONIA,  in the last 168 hours CBC:  Recent Labs Lab 03/23/14 1403 03/24/14 0145 03/25/14 0514  WBC 2.9* 2.2* 2.2*  NEUTROABS 1.7  --   --   HGB 9.7* 8.6* 8.1*  HCT 28.0* 25.7* 23.7*  MCV 96.2 99.2 96.3  PLT 115* 82* 78*   Cardiac Enzymes:  Recent Labs Lab 03/25/14 0514  CKTOTAL 21   BNP (last 3 results) No  results found for this basename: PROBNP,  in the last 8760 hours CBG: No results found for this basename: GLUCAP,  in the last 168 hours  Micro Recent Results (from the past 240 hour(s))  SURGICAL PCR SCREEN     Status: None   Collection Time    03/24/14  1:51 AM      Result Value Ref Range Status   MRSA, PCR NEGATIVE  NEGATIVE Final   Staphylococcus aureus NEGATIVE  NEGATIVE Final   Comment:            The Xpert SA Assay (FDA     approved for NASAL specimens     in patients over 38 years of age),     is one component of     a comprehensive surveillance     program.  Test performance has     been validated by [employer] for patients greater     than or equal to 26 year old.     It is not intended     to diagnose infection nor to     guide or monitor treatment.     Studies: Mr Lumbar Spine Wo Contrast  03/23/2014   CLINICAL DATA:  Multilevel infectious discitis. HIV infection. Lymphoma. Left flank pain of 2 days duration.  EXAM: MRI LUMBAR SPINE WITHOUT CONTRAST  TECHNIQUE: Multiplanar, multisequence MR imaging of the lumbar spine was performed. No intravenous contrast was administered.  COMPARISON:  CT scan same day.  Previous MRI 01/26/2014  FINDINGS: The scan covers the region from lower T9 through S3.  T9-10 does not appear involved.  T10-11: Discitis osteomyelitis at this level with fluid intensity material in the disc space and marrow edema throughout the T10 and T11 vertebral bodies. Slight loss of height at the endplates. No evidence of epidural abscess. No cord compression.  T11-12: No evidence of involvement at this level.  T12-L1 L1-2:  Normal.  L2-3: New abnormality on the left with a superior endplate Schmorl's node type pattern at L3. This could be evidence of early disc space infection at this level or this could be an actual an related Schmorl's node.  L3-4: Persistent evidence of discitis osteomyelitis at this level with edema in the disc and vertebral bodies but no  evidence of progression. No spinal abscess.  L4-5:  No evidence of involvement of this disc space.  L5-S1: Worsened pattern with fluid throughout the disc space and worsened edema within the L5 and S1 vertebral bodies. There appears to be either a left posterior lateral disc herniation with caudal migration or some epidural abscess on the left behind S1. The thecal sac is not compressed. There would be potential to affect the S1 nerve roots.  IMPRESSION: Newly seen discitis osteomyelitis at T10-11. Fluid in the disc space. Early collapse of the endplates. No spinal abscess or neural compression.  Newly seen abnormality on the left at L2-3 which looks like a superior endplate Schmorl's node but could be evidence of early infection developing at the L2-3 disc level.  Persistent osteomyelitis discitis at L3-4, similar to the previous study. No abscess  or neural compression.  Marked worsening of the appearance at L5-S1 with fluid throughout the disc space, edema within the L5 and S1 vertebral bodies, an abnormal material in the ventral epidural space that could be extruded disc material or ventral epidural abscess. The central canal is not compressed but there would be potential to affect the S1 nerve roots.   Electronically Signed   By: Nelson Chimes M.D.   On: 03/23/2014 20:21   Ct Renal Stone Study  03/23/2014   CLINICAL DATA:  Left flank pain.  HIV, cirrhosis, lymphoma  EXAM: CT ABDOMEN AND PELVIS WITHOUT CONTRAST  TECHNIQUE: Multidetector CT imaging of the abdomen and pelvis was performed following the standard protocol without IV contrast.  COMPARISON:  CT 11/10/2013  FINDINGS: On today's study, neither oral nor IV contrast was administered limiting evaluation of the bowel.  Mild right lower lobe atelectasis with elevated right hemidiaphragm. Left lung base is clear.  Advanced cirrhotic liver. Prior cholecystectomy. Marked splenomegaly unchanged. Spleen measures 17 x 14 x 18 cm. Periportal adenopathy seen on  the prior study difficult to evaluate on today's study due to lack of oral and IV contrast. Probably no change.  The kidneys are normal without obstruction or mass. No renal calculi. Urinary bladder normal.  Severe constipation with large amount of stool in the colon. No bowel obstruction. Previously noted thickening of the right colon not identified on today's study. This may have been infectious colitis which has resolved  No free fluid.  No pathologic adenopathy.  Progressive discitis at T10-11 with further bone loss and kyphosis. Progressive discitis and osteomyelitis at L3-4, and L5-S1 with sclerotic changes and endplate erosion. Epidural abscess not evaluated on unenhanced CT.  IMPRESSION: No renal calculi.  Advanced cirrhosis. Marked splenomegaly is unchanged. No free fluid.  Marked constipation.  Progressive discitis and osteomyelitis at T10-11, L3-4, L5-S1.   Electronically Signed   By: Franchot Gallo M.D.   On: 03/23/2014 15:34    Scheduled Meds: . atovaquone  750 mg Oral Daily  . azithromycin  1,200 mg Oral Q Sat  . buPROPion  300 mg Oral Daily  . clonazePAM  1 mg Oral QHS  . DAPTOmycin (CUBICIN)  IV  630 mg Intravenous Q24H  . Darunavir Ethanolate  800 mg Oral Q breakfast  . emtricitabine-tenofovir  1 tablet Oral QHS  . enoxaparin (LOVENOX) injection  40 mg Subcutaneous Q24H  . feeding supplement (ENSURE COMPLETE)  237 mL Oral BID BM  . gabapentin  400 mg Oral TID  . micafungin [facility]) IV  150 mg Intravenous Daily  . morphine  60 mg Oral Q12H  . ritonavir  100 mg Oral Q breakfast  . sucralfate  1 g Oral TID WC   Continuous Infusions: . sodium chloride 75 mL/hr at 03/24/14 0236       Time spent: 35 minutes    [facility] Pager [phone] If 7PM-7AM, please contact night-coverage at [email or web address], password [facility] 03/25/2014, 12:46 PM  LOS: 2 days

## 2014-03-26 DIAGNOSIS — M549 Dorsalgia, unspecified: Secondary | ICD-10-CM

## 2014-03-26 DIAGNOSIS — D61818 Other pancytopenia: Secondary | ICD-10-CM

## 2014-03-26 DIAGNOSIS — E440 Moderate protein-calorie malnutrition: Secondary | ICD-10-CM

## 2014-03-26 DIAGNOSIS — E43 Unspecified severe protein-calorie malnutrition: Secondary | ICD-10-CM

## 2014-03-26 LAB — CBC
HCT: 24 % — ABNORMAL LOW (ref 39.0–52.0)
HEMOGLOBIN: 8.2 g/dL — AB (ref 13.0–17.0)
MCH: 32.4 pg (ref 26.0–34.0)
MCHC: 34.2 g/dL (ref 30.0–36.0)
MCV: 94.9 fL (ref 78.0–100.0)
PLATELETS: 85 10*3/uL — AB (ref 150–400)
RBC: 2.53 MIL/uL — AB (ref 4.22–5.81)
RDW: 15.9 % — ABNORMAL HIGH (ref 11.5–15.5)
WBC: 2.3 10*3/uL — ABNORMAL LOW (ref 4.0–10.5)

## 2014-03-26 MED ORDER — BISACODYL 10 MG RE SUPP: 10.0000 mg | Freq: Every day | RECTAL | Status: DC | PRN

## 2014-03-26 MED ORDER — MORPHINE SULFATE 2 MG/ML IJ SOLN: 2.0000 mg | Freq: Once | INTRAMUSCULAR | Status: AC

## 2014-03-26 MED ORDER — SENNOSIDES-DOCUSATE SODIUM 8.6-50 MG PO TABS
2.0000 | ORAL_TABLET | Freq: Once | ORAL | Status: AC
  Administered 2014-03-26: 2 via ORAL
  Filled 2014-03-26: qty 2

## 2014-03-26 NOTE — Progress Notes (Signed)
[facility]ious [facility]    Date of Admission:  03/23/2014   Total days of antibiotics 4        Day 4 dapto        Day 4 mica           ID: Samuel Schultz is a 38 y.o. 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 He was previously treated with cefepime, fluconazole and daptomycin. He was in skilled nursing facility with recurrent fevers, repeat imaging suggests worsening process of L5-S1 and new areas involvement at T10-11. He has hx of Bcell lymphoma treated with RCHOP. Last BM in 10/2013 showed no signs of recurrence. Has cirrhosis as sequelae of treatment. Principal Problem:   Diskitis Active Problems:   AIDS   Cirrhosis, non-alcoholic   Pancytopenia   Protein-calorie malnutrition, severe   Abscess in epidural space of lumbar spine   Infectious discitis    Subjective: Afebrile, having signficant left sided flank pain, unable to stand for more than 5 minutes  Medications:  . atovaquone  750 mg Oral Daily  . azithromycin  1,200 mg Oral Q Sat  . buPROPion  300 mg Oral Daily  . clonazePAM  1 mg Oral QHS  . DAPTOmycin (CUBICIN)  IV  630 mg Intravenous Q24H  . Darunavir Ethanolate  800 mg Oral Q breakfast  . emtricitabine-tenofovir  1 tablet Oral QHS  . enoxaparin (LOVENOX) injection  40 mg Subcutaneous Q24H  . feeding supplement (ENSURE COMPLETE)  237 mL Oral BID BM  . gabapentin  400 mg Oral TID  . micafungin [facility]) IV  150 mg Intravenous Daily  . morphine  60 mg Oral Q12H  . ritonavir  100 mg Oral Q breakfast  . sucralfate  1 g Oral TID WC    Objective: Vital signs in last 24 hours: Temp:  [98.4 F (36.9 C)-98.6 F (37 C)] 98.6 F (37 C) (10/19 0514) Pulse Rate:  [98-99] 99 (10/19 0514) Resp:  [18] 18 (10/19 0514) BP: (108-117)/(60-63) 108/61 mmHg (10/19 0514) SpO2:  [98 %] 98 % (10/19 0514) Physical Exam  Constitutional: He is  oriented to person, place, and time. He appears well-developed and well-nourished. tearful HENT:  Mouth/Throat: Oropharynx is clear and moist. No oropharyngeal exudate.  Neurological: He is alert and oriented to person, place, and time.  Skin: Skin is warm and dry. No rash noted. No erythema.  Psychiatric: tearful   Lab Results  Recent Labs  03/24/14 0145 03/25/14 0514 03/26/14 0559  WBC 2.2* 2.2* 2.3*  HGB 8.6* 8.1* 8.2*  HCT 25.7* 23.7* 24.0*  NA 135* 135*  --   K 4.0 4.1  --   CL 104 106  --   CO2 23 22  --   BUN 9 8  --   CREATININE 0.44* 0.42*  --    Liver Panel  Recent Labs  03/24/14 0145 03/25/14 0514  PROT 7.6 7.4  ALBUMIN 2.0* 2.0*  AST 21 22  ALT 11 11  ALKPHOS 112 104  BILITOT 1.1 1.1    Assessment/Plan: Worsening discitis = spoke to neuroradiology to discuss the utility of aspirate of T10-11 plus also L5 area. Features appear consistent with infection, spares bone marrow thus neoplasm is less likely. Only has had candida isolated in late August 2015.  Since admit, fluconazole has been changed to micafungin. Recommend to Continue with micafungin  and daptomycin at 8mg /kg. Repeat aspirate not likely to provide any useful information  Back pain = consider better oral medicaitons +/- utility of back brace to help with pain.   hiv = continue on current regimen of truvada/DRVr  oi proph = continue on weekly azithromycin and daily atovaquone  Fifi Schindler, [facility] for Infectious Diseases Cell: [phone] Pager: [phone]  03/26/2014, 11:02 AM

## 2014-03-26 NOTE — Clinical Social Work Placement (Signed)
Clinical Social Work Department CLINICAL SOCIAL WORK PLACEMENT NOTE 03/26/2014  Patient:  Samuel Schultz, Samuel Schultz  Account Number:  [id number] Admit date:  03/23/2014  Clinical Social Worker:  Kemper Durie, [place]  Date/time:  03/26/2014 03:45 PM  Clinical Social Work is seeking post-discharge placement for this patient at the following level of care:   [place]   (*CSW will update this form in Epic as items are completed)   03/26/2014  Patient/family provided with [place] Department of Clinical Social Work's list of facilities offering this level of care within the geographic area requested by the patient (or if unable, by the patient's family).  03/26/2014  Patient/family informed of their freedom to choose among providers that offer the needed level of care, that participate in Medicare, Medicaid or managed care program needed by the patient, have an available bed and are willing to accept the patient.  03/26/2014  Patient/family informed of [facility]' ownership interest in [facility], as well as of the fact that they are under no obligation to receive care at this facility.  PASARR submitted to EDS on  PASARR number received on   FL2 transmitted to all facilities in geographic area requested by pt/family on  03/26/2014 FL2 transmitted to all facilities within larger geographic area on   Patient informed that his/her managed care company has contracts with or will negotiate with  certain facilities, including the following:     Patient/family informed of bed offers received:   Patient chooses bed at  Physician recommends and patient chooses bed at    Patient to be transferred to  on   Patient to be transferred to facility by  Patient and family notified of transfer on  Name of family member notified:    The following physician request were entered in Epic:   Additional Comments:   Liz Beach MSW, [place], [place], [phone]

## 2014-03-26 NOTE — Progress Notes (Signed)
Addendum  Patient seen and examined, chart and data base reviewed.  I agree with the above assessment and plan.  For full details please see Mrs. Imogene Burn PA note.  Diskitis, patient is on micafungin and daptomycin, appreciate ID help.   Birdie Hopes, MD [facility] Pager: [phone] 03/26/2014, 4:27 PM

## 2014-03-26 NOTE — Progress Notes (Signed)
[facility] PROGRESS NOTE   Samuel Schultz MWN:[id number] DOB: April 21, 1976 DOA: 03/23/2014 PCP: [name], MD  HPI/Subjective: Complains of pain in left side.  Will not get out of bed due to pain.  Assessment/Plan: Principal Problem:   Diskitis Active Problems:   AIDS   Cirrhosis, non-alcoholic   Pancytopenia   Protein-calorie malnutrition, severe   Abscess in epidural space of lumbar spine   Infectious discitis     Acute discitis -EDP discussed with Neurosurgery who recommending ID and antibiotics for now - not a good surgical candidate. -Recent h/o L3-4 discitis and L5-S1 abscess, came in with new discitis at T10-11, worsening L5-S1 abscess, and persistent  L3-4 discitis   -Patient had VRE bacteremia (11/11/2013), ampicillin sensitive enterococcus (11/10/2013) and Candida albicans in bld cultures. -As far as culture nothing grew out of the disc aspirate 12/15/2013. -Discussed with both ID and IR, aspirating the disc spaces not going to change management now, will hold. -Patient discharged on cefepime, daptomycin and fluconazole. -Seen by Dr. Linus Salmons of ID, placed on Micafungin and daptomycin.  If patient does not improve, in the coming weeks he may need evaluation for surgical intervention.  AIDS/HIV infection -Absolute CD4 count is 40 on 01/27/14. -Patient is on Truvada, Prezista and Norvir. -Azithromycin for prophylaxis. -Infectious Disease is on board.  Cirrhosis -Radiological evidence of cirrhosis and portal hypertension, with massive splenomegaly and esophageal varices. -His INR is 1.86. Normal LFTs. Hep C antibody is negative. -Reportedly cirrhosis secondary to previous lymphoma infiltration to the liver and R-CHOP chemotherapy.  Constipation Patient complains of no BM x 1 week. Will give Senna S and Suppository (PRN)  Pancytopenia -Including leukopenia, anemia and thrombocytopenia. -Likely multifactorial from bone marrow suppression from AIDS/cirrhosis and  hypersplenism.  Severe protein calorie malnutrition -Dietitian to see, his BMI is in the overweight range, his albumin is at 2.0.  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 -Had bone marrow biopsy done on 11/02/2013 in the [facility], showed no evidence of recurrent disease  Code Status: Full code Family Communication: Plan discussed with the patient. Disposition Plan: Remains inpatient will d/c to SNF when appropriate.   Consultants:  Infectious disease.  Procedures:  None  Antibiotics:  Micafungin 10/17.  Daptomycin continued since discharge last time.   Objective: Filed Vitals:   03/26/14 0514  BP: 108/61  Pulse: 99  Temp: 98.6 F (37 C)  Resp: 18    Intake/Output Summary (Last 24 hours) at 03/26/14 1256 Last data filed at 03/26/14 1053  Gross per 24 hour  Intake      0 ml  Output    700 ml  Net   -700 ml   Filed Weights   03/23/14 1241 03/24/14 0057  Weight: 74.844 kg (165 lb) 79.198 kg (174 lb 9.6 oz)    Exam: General: Alert and awake, oriented x3, not in any acute distress. Appears mildly jaundiced  HEENT: anicteric sclera, pupils reactive to light and accommodation, EOMI CVS: S1-S2 clear, no murmur rubs or gallops Chest: clear to auscultation bilaterally, no wheezing, rales or rhonchi Abdomen: soft tender to palpation on the left flank, nondistended, normal bowel sounds, no organomegaly Extremities: no cyanosis, clubbing or edema noted bilaterally Neuro: Cranial nerves II-XII intact, no focal neurological deficits  Data Reviewed: Basic Metabolic Panel:  Recent Labs Lab 03/23/14 1403 03/24/14 0145 03/25/14 0514  NA 133* 135* 135*  K 4.2 4.0 4.1  CL 104 104 106  CO2 21  23 22  GLUCOSE 106* 89 95  BUN 11 9 8   CREATININE 0.43* 0.44* 0.42*  CALCIUM 8.8 8.4 8.4   Liver Function Tests:  Recent Labs Lab 03/24/14 0145 03/25/14 0514  AST 21 22  ALT 11 11  ALKPHOS 112 104    BILITOT 1.1 1.1  PROT 7.6 7.4  ALBUMIN 2.0* 2.0*   CBC:  Recent Labs Lab 03/23/14 1403 03/24/14 0145 03/25/14 0514 03/26/14 0559  WBC 2.9* 2.2* 2.2* 2.3*  NEUTROABS 1.7  --   --   --   HGB 9.7* 8.6* 8.1* 8.2*  HCT 28.0* 25.7* 23.7* 24.0*  MCV 96.2 99.2 96.3 94.9  PLT 115* 82* 78* 85*   Cardiac Enzymes:  Recent Labs Lab 03/25/14 0514  CKTOTAL 21    Micro Recent Results (from the past 240 hour(s))  SURGICAL PCR SCREEN     Status: None   Collection Time    03/24/14  1:51 AM      Result Value Ref Range Status   MRSA, PCR NEGATIVE  NEGATIVE Final   Staphylococcus aureus NEGATIVE  NEGATIVE Final   Comment:            The Xpert SA Assay (FDA     approved for NASAL specimens     in patients over 31 years of age),     is one component of     a comprehensive surveillance     program.  Test performance has     been validated by [employer] for patients greater     than or equal to 34 year old.     It is not intended     to diagnose infection nor to     guide or monitor treatment.     Studies: No results found.  Scheduled Meds: . atovaquone  750 mg Oral Daily  . azithromycin  1,200 mg Oral Q Sat  . buPROPion  300 mg Oral Daily  . clonazePAM  1 mg Oral QHS  . DAPTOmycin (CUBICIN)  IV  630 mg Intravenous Q24H  . Darunavir Ethanolate  800 mg Oral Q breakfast  . emtricitabine-tenofovir  1 tablet Oral QHS  . enoxaparin (LOVENOX) injection  40 mg Subcutaneous Q24H  . feeding supplement (ENSURE COMPLETE)  237 mL Oral BID BM  . gabapentin  400 mg Oral TID  . micafungin [facility]) IV  150 mg Intravenous Daily  . morphine  60 mg Oral Q12H  . ritonavir  100 mg Oral Q breakfast  . senna-docusate  2 tablet Oral Once  . sucralfate  1 g Oral TID WC   Continuous Infusions: . sodium chloride 75 mL/hr at 03/24/14 0236       Time spent: 35 minutes    Karen Kitchens  [facility] Pager [phone] If 7PM-7AM, please contact night-coverage at  [email or web address], password [facility] 03/26/2014, 12:56 PM  LOS: 3 days

## 2014-03-26 NOTE — Clinical Social Work Psychosocial (Signed)
Clinical Social Work Department BRIEF PSYCHOSOCIAL ASSESSMENT 03/26/2014  Patient:  Samuel Schultz, Samuel Schultz     Account Number:  [id number]     Admit date:  03/23/2014  Clinical Social Worker:  Lovey Newcomer  Date/Time:  03/26/2014 03:30 PM  Referred by:  Physician  Date Referred:  03/26/2014 Referred for  SNF Placement   Other Referral:   NA   Interview type:  Patient Other interview type:   Patient alert and oriented at time of assessment. Patient's HPOA Samuel Schultz also contacted per patient's request.    PSYCHOSOCIAL DATA Living Status:  FACILITY Admitted from facility:  [facility] Level of care:  [place] Primary support name:  Samuel Schultz Primary support relationship to patient:  NONE Degree of support available:   Support is fair.    CURRENT CONCERNS Current Concerns  Post-Acute Placement   Other Concerns:   NA    SOCIAL WORK ASSESSMENT / PLAN CSW met with patient at bedside to complete assessment. Patient was admitted from [facility]. Patient states that he does not want to return to this facility and would like CSW to assist with getting him placed in [place] as his support system lives in this area. Patient reports his HPOA Samuel Schultz is his main support person and she has been assisting him with his affairs. Per patient's request, CSW spoke with [facility]. Samuel Schultz reports that she has been contacting facilities in the [place] area. CSW explained SNF search/placement process to both patient and Samuel Schultz and explained that patient's insurance (Medicaid) may be a barrier to placement and that a facility of their preference may not be obtainable. Patient and Samuel Schultz verbalize understanding. CSW will assist.   Assessment/plan status:  Psychosocial Support/Ongoing Assessment of Needs Other assessment/ plan:   Complete Fl2, Fax, PASRR   Information/referral to community resources:   CSW contact information and SNF list given.     PATIENT'S/FAMILY'S RESPONSE TO PLAN OF CARE: Patient states that he plans to DC to SNF but would like to DC to a SNF in [place]. CSW will assist as appropriate.       Liz Beach MSW, [place], [place], [phone]

## 2014-03-26 NOTE — Evaluation (Signed)
Physical Therapy Evaluation Patient Details Name: Samuel Schultz MRN: [id number] DOB: April 11, 1976 Today's Date: 03/26/2014   History of Present Illness    Samuel Schultz is a 38 y.o. 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 He was previously treated with cefepime, fluconazole and daptomycin. He was in skilled nursing facility with recurrent fevers, repeat imaging suggests worsening process of L5-S1 and new areas involvement at T10-11. He has hx of Bcell lymphoma treated with RCHOP. Last BM in 10/2013 showed no signs of recurrence. Has cirrhosis as sequelae of treatment.   Clinical Impression  Pt admitted with above. Pt currently with functional limitations due to the deficits listed below (see PT Problem List).  Pt will benefit from skilled PT to increase their independence and safety with mobility to allow discharge to the venue listed below.     Follow Up Recommendations SNF;Supervision/Assistance - 24 hour    Equipment Recommendations  None recommended by PT    Recommendations for Other Services       Precautions / Restrictions Precautions Precautions: Fall Precaution Comments: educated on back precautions for safety and decreased pain Restrictions Weight Bearing Restrictions: No      Mobility  Bed Mobility Overal bed mobility: Needs Assistance Bed Mobility: Sidelying to Sit;Sit to Sidelying Rolling: Min assist Sidelying to sit: Min assist     Sit to sidelying: Mod assist General bed mobility comments: cues for sequence and technique with assist to elevate trunk from surface.  Also assist to bring legs onto bed at end of treatment.    Transfers                 General transfer comment: Refused to stand today.   Ambulation/Gait                Stairs            Wheelchair Mobility    Modified Rankin (Stroke Patients Only)        Balance Overall balance assessment: Needs assistance;History of Falls Sitting-balance support: No upper extremity supported;Feet supported Sitting balance-Leahy Scale: Fair Sitting balance - Comments: Pt sat EOB 20 min.  Did some exercises.                                       Pertinent Vitals/Pain Pain Assessment: 0-10 Pain Score: 10-Worst pain ever Faces Pain Scale: Hurts worst Pain Location: left side and back Pain Descriptors / Indicators: Aching;Pressure Pain Intervention(s): Limited activity within patient's tolerance;Monitored during session;Premedicated before session;Repositioned VSS    Home Living Family/patient expects to be discharged to:: [place]: None      Prior Function Level of Independence: Needs assistance   Gait / Transfers Assistance Needed: [facility]  [facility] to [facility] center and has been wheelchair bound since August.  "Has fallen out of wheelchair x 2 as well when bending over or forgot to lock wheelchair.    ADL's / Homemaking Assistance Needed: assist for bathing        Hand Dominance   Dominant Hand: Right    Extremity/Trunk Assessment   Upper Extremity Assessment: Defer to OT evaluation  Lower Extremity Assessment: RLE deficits/detail RLE Deficits / Details: grossly 2/5    Cervical / Trunk Assessment: Normal  Communication   Communication: No difficulties  Cognition Arousal/Alertness: Awake/alert Behavior During Therapy: Flat affect Overall Cognitive Status: Impaired/Different from baseline Area of Impairment: Safety/judgement;Following commands       Following Commands: Follows one step commands inconsistently Safety/Judgement: Decreased awareness of safety          General Comments General comments (skin integrity, edema, etc.): Pt tearful throughout session and talking about his stay at [facility] center and  all that has happened to him.      Exercises General Exercises - Lower Extremity Ankle Circles/Pumps: AROM;Both;10 reps;Seated Long Arc Quad: AROM;Both;10 reps;Seated Hip Flexion/Marching: AROM;Both;10 reps;Seated      Assessment/Plan    PT Assessment Patient needs continued PT services  PT Diagnosis Acute pain;Difficulty walking   PT Problem List Decreased strength;Decreased range of motion;Decreased activity tolerance;Decreased balance;Decreased mobility;Decreased knowledge of use of DME;Decreased safety awareness;Decreased knowledge of precautions;Pain  PT Treatment Interventions DME instruction;Gait training;Functional mobility training;Therapeutic activities;Therapeutic exercise;Neuromuscular re-education;Patient/family education;Wheelchair mobility training   PT Goals (Current goals can be found in the Care Plan section) Acute Rehab PT Goals Patient Stated Goal: None stated during session PT Goal Formulation: With patient Time For Goal Achievement: 04/09/14 Potential to Achieve Goals: Fair    Frequency Min 2X/week   Barriers to discharge        Co-evaluation               End of Session Equipment Utilized During Treatment: Gait belt Activity Tolerance: Patient limited by pain Patient left: in bed;with call bell/phone within reach Nurse Communication: Mobility status;Precautions         Time: 0350-0938 PT Time Calculation (min): 32 min   Charges:   PT Evaluation $Initial PT Evaluation Tier I: 1 Procedure PT Treatments $Therapeutic Exercise: 8-22 mins $Therapeutic Activity: 8-22 mins   PT G CodesDenice Paradise 22-Apr-2014, 4:44 PM [place] Hailie Searight,PT Acute Rehabilitation [phone] (pager)

## 2014-03-26 NOTE — Progress Notes (Signed)
Patient ID: Samuel Schultz, male   DOB: 1975-09-07, 38 y.o.   MRN: [id number]               PROGRESS NOTE  DATE:  03/19/2014    FACILITY: [facility]    LEVEL OF CARE:   SNF   Acute Visit   CHIEF COMPLAINT:  Review of radiographic imaging, problems at T10.    HISTORY OF PRESENT ILLNESS:  I saw Mr. Joslyn last week, at which time he was running low-grade fevers.  I did a chest x-ray on him, looking for a source.  This was clear.  Blood cultures negative.  His lab work, including a CBC with diff, showed the usual pancytopenia, anemia and thrombocytopenia from presumably his cirrhosis and portal hypertension.    Although his chest x-ray did not show pneumonia, there appeared to the radiologist to be a mild T10 compression deformity that was new from a previous study on 01/05/2014.  A previous T11 compression was felt to be stable.    Apparently, a copy of this report was given to the patient.  This created a furor of activity in the facility including a call to my office, a call to Dr. Almyra Free of Radiology, and a call to the facility ombudsman.  I was called to order an MRI of the back, specifically the T10 area, which I did not think was warranted given the fact that the x-ray was not done for this reason.  I wondered whether this was likely to be an incidental finding, keeping in mind that the patient has had multiple imaging studies in and around this area that I thought we could use to determine the significance of this.  I am reviewing this today.    The patient's previous MRI of the lumbar spine, done in follow-up for his L3-L4, L5/S1 osteomyelitis, only got to the T12 level.  It did not show T10.  However, CT scans of the abdomen and pelvis done on 11/10/2013 showed, among other things, that at T10 and T11 there was mild compression which was unchanged with approximately 15-20% loss of height at both vertebral bodies.  There were also multifocal, predominantly sclerotic, bony  lesions noted which, of course, can be caused by lymphoma.  With regards to the lymphoma itself, he has a history of non-Hodgkin's lymphoma, treated with R-CHOP in 2010-2011.  This was followed up until 2012 by a Dr. Nicoletta Dress in [place].  As far as I can see, he saw Dr. Barnet Glasgow in our cancer center on 10/18/2013, but I do not see a follow-up appointment.  There is some suggestion of doing a bone marrow, although I do not see that this was done, at least I cannot see the report.      REVIEW OF SYSTEMS:   GENERAL:  The patient has not had any further fever.   CHEST/RESPIRATORY:  No shortness of breath.  CARDIAC:   No chest pain.    GI:  No abdominal pain.   MUSCULOSKELETAL:  He is having low back pain.    PHYSICAL EXAMINATION:   CHEST/RESPIRATORY:  Clear air entry bilaterally.   CARDIOVASCULAR:  CARDIAC:   Heart sounds are normal.  There are no murmurs.  He appears to be euvolemic.   GASTROINTESTINAL:  LIVER/SPLEEN/KIDNEYS:  No liver.  However, there may be a spleen tip here right at the costal margin.  There are no other masses.   MUSCULOSKELETAL:   BACK:  He is very tender at roughly the upper  lumbar area.    ASSESSMENT/PLAN:  Mixed sclerotic lesions previously known at T7, T10, T11, L3, L5, and S1 with sclerotic and lytic lucencies in the bilateral ilium.  My feeling would be that this is certainly compatible with what non-Hodgkin's lymphoma can do.  He did receive chemotherapy for this.  Although the T10 area was read on the chest x-ray as being new, looking back at his CT scans as described above, I really do not think this is new.  However he is likely to need a repeat MRI including of all of these areas.   Osteomyelitis/diskitis.  Completing his antibiotics.  His lab work actually looks fairly good.  He is due to see Infectious Disease, Dr. Tommy Medal, next week.  I am going to wait until Dr. Tommy Medal sees the patient.  No doubt he is preparing to re-image the LS-spine MRI, I will extend this  above to look at his lower T-spine, as well.  The issue here is that the patient is planning to move to a palliative care/?hospice home in [place].  He would then transition all his records, including Infectious Disease and Oncology, to the [facility] area.  I am not planning to arrange an oncology appointment here if this is the way this is going to go from a purely logistic point of view.  If it turns out he will need additional therapy here, additional antibiotics, then I will try to have a repeat review from Dr. Barnet Glasgow.    I explained all of this to the patient in some detail.  The bottom line is that I do not think the T10 area is new, although I am not saying that we do not need to MRI this area. The source of his fevers is possibly multiple including his lumbar spine, "b symptom" of his advanced lymphoma, HIV??? He follows with IDin 10 days.

## 2014-03-27 ENCOUNTER — Ambulatory Visit: Payer: Medicaid Other | Admitting: Gastroenterology

## 2014-03-27 DIAGNOSIS — N179 Acute kidney failure, unspecified: Secondary | ICD-10-CM

## 2014-03-27 LAB — CBC
HEMATOCRIT: 25 % — AB (ref 39.0–52.0)
Hemoglobin: 8.6 g/dL — ABNORMAL LOW (ref 13.0–17.0)
MCH: 33.5 pg (ref 26.0–34.0)
MCHC: 34.4 g/dL (ref 30.0–36.0)
MCV: 97.3 fL (ref 78.0–100.0)
PLATELETS: 87 10*3/uL — AB (ref 150–400)
RBC: 2.57 MIL/uL — ABNORMAL LOW (ref 4.22–5.81)
RDW: 15.7 % — ABNORMAL HIGH (ref 11.5–15.5)
WBC: 2.1 10*3/uL — AB (ref 4.0–10.5)

## 2014-03-27 MED ORDER — OXYCODONE HCL 5 MG PO TABS: 5.0000 mg | ORAL_TABLET | ORAL | Status: DC | PRN

## 2014-03-27 MED ORDER — SENNOSIDES-DOCUSATE SODIUM 8.6-50 MG PO TABS
2.0000 | ORAL_TABLET | Freq: Every day | ORAL | Status: DC
  Administered 2014-03-28 – 2014-04-23 (×22): 2 via ORAL
  Filled 2014-03-27: qty 1
  Filled 2014-03-27 (×23): qty 2
  Filled 2014-03-27: qty 1

## 2014-03-27 MED ORDER — MORPHINE SULFATE 15 MG PO TABS: 15.0000 mg | ORAL_TABLET | ORAL | Status: DC | PRN

## 2014-03-27 MED ORDER — POLYETHYLENE GLYCOL 3350 17 G PO PACK
17.0000 g | PACK | Freq: Every day | ORAL | Status: DC
  Administered 2014-03-30 – 2014-04-23 (×13): 17 g via ORAL
  Filled 2014-03-27 (×28): qty 1

## 2014-03-27 MED ORDER — MORPHINE SULFATE 15 MG PO TABS
30.0000 mg | ORAL_TABLET | ORAL | Status: DC | PRN
  Administered 2014-03-27 – 2014-03-28 (×3): 30 mg via ORAL
  Filled 2014-03-27 (×4): qty 2

## 2014-03-27 MED ORDER — SODIUM CHLORIDE 0.9 % IV SOLN
INTRAVENOUS | Status: DC
  Administered 2014-03-27 – 2014-04-04 (×5): via INTRAVENOUS
  Administered 2014-04-06: 10 mL/h via INTRAVENOUS
  Administered 2014-04-10 – 2014-04-22 (×7): via INTRAVENOUS

## 2014-03-27 NOTE — Progress Notes (Signed)
PT Cancellation Note  Patient Details Name: Lindbergh Winkles MRN: [id number] DOB: 04/19/1976   Cancelled Treatment:    Reason Eval/Treat Not Completed: Patient declined, no reason specified Pt declined participating in therapy this PM reporting 10/10 pain in back bilaterally. Not due for pain medication for another 1.5 hours. Will follow up next available time.   Candy Sledge A 03/27/2014, 1:43 PM Candy Sledge, [place], DPT [phone]

## 2014-03-27 NOTE — Clinical Social Work Note (Signed)
CSW has reviewed intern's work on finding patient a different facility. Patient is aware that if new facility cannot be obtained, he will DC back to [facility]. CSW will continue to follow.    Liz Beach MSW, [place], [place], [phone]

## 2014-03-27 NOTE — Progress Notes (Signed)
Pt call for pain medicine. Nurse go in room to [employer]. Pt sleeping. Nurse wake up patient to admin night time meds.

## 2014-03-27 NOTE — Clinical Social Work Note (Signed)
BSW Intern contacted the following facilities per patient request:  [facility]- Referral has been sent, waiting for response. [facility]- No answer, message was left and waiting to hear back from facility.  [facility]- No answer, message was left and waiting to hear back from facility.  [facility]- Referral has been sent, waiting for response.  [facility] with admissions director, no beds available for medicaid patient's.  [facility] [place] with admissions director, no beds available for medicaid patient's. [facility]- Referral has been sent, waiting for response.  [facility], no answer and was unable to leave voicemail.  [facility] received referral, no bed offer.  [facility]- No answer, message was left and waiting to hear back from facility.  [facility]- No answer, message was left and waiting to hear back from facility.  [facility]- Referral has been sent, waiting for response.  [facility] received referral, no bed offer.    Kingsley Spittle, [place] Intern, [phone]

## 2014-03-27 NOTE — Progress Notes (Signed)
CARE MANAGEMENT NOTE 03/27/2014  Patient:  SHAARAV, RIPPLE   Account Number:  [id number]  Date Initiated:  03/27/2014  Documentation initiated by:  [facility]  Subjective/Objective Assessment:   candidal discitis     Action/Plan:   SNF   Anticipated DC Date:  03/28/2014   Anticipated DC Plan:  SKILLED NURSING FACILITY  In-house referral  Clinical Social Worker      DC Planning Services  CM consult      Choice offered to / List presented to:             Status of service:  Completed, signed off Medicare Important Message given?  NO (If response is "NO", the following Medicare IM given date fields will be blank) Date Medicare IM given:   Medicare IM given by:   Date Additional Medicare IM given:   Additional Medicare IM given by:    Discharge Disposition:  [place]  Per UR Regulation:  Reviewed for med. necessity/level of care/duration of stay  If discussed at Long Length of Stay Meetings, dates discussed:    Comments:  Plan dc to SNF when medically stable. CSW following for placement. Jonnie Finner RN CCM Case Mgmt phone [phone]

## 2014-03-27 NOTE — Progress Notes (Signed)
[facility] PROGRESS NOTE   Samuel Schultz IEP:[id number] DOB: December 04, 1975 DOA: 03/23/2014 PCP: [name], MD  HPI/Subjective: Complaining of severe pain.  Unhappy that IV morphine (2 mg) has been discontinued.  Still complaining despite replacing the IV morphine (2 mg) with MSIR 30 mg PO PRN.  Had in-depth conversation with POA Rosaria Ferries.  She understands the plan of care.  She requests a pain management consultation. She requests that he be discharged to a facility closer to [facility].  Assessment/Plan: Principal Problem:   Diskitis Active Problems:   AIDS   Cirrhosis, non-alcoholic   Pancytopenia   Protein-calorie malnutrition, severe   Abscess in epidural space of lumbar spine   Infectious discitis  Acute discitis -EDP discussed with Neurosurgery who recommended ID consultation and antibiotics for now - not a good surgical candidate. -Recent h/o L3-4 discitis and L5-S1 abscess, came in with new discitis at T10-11, worsening L5-S1 abscess, and persistent  L3-4 discitis   -Patient had VRE  (11/11/2013) and ampicillin sensitive enterococcus (11/10/2013)  in bld cultures. -Candida Albicans previously found in L5 - S1 abscess. -Discussed with both ID and IR, aspirating the disc spaces not going to change management now, will hold. -Patient discharged on cefepime, daptomycin and fluconazole. -Seen by Dr. Linus Salmons of ID, placed on Micafungin and daptomycin.  If patient does not improve, in the coming weeks he may need evaluation for surgical intervention. -Patient still with significant pain despite high dose pain medications. Question whether or not this patient needs a palliative medicine consultation for pain management.  AIDS/HIV infection -Absolute CD4 count is 40 on 01/27/14. -Patient is on Truvada, Prezista and Norvir. -Azithromycin for prophylaxis. -Infectious Disease is on board.  Cirrhosis -Radiological evidence of cirrhosis and portal hypertension, with massive  splenomegaly and esophageal varices. -His INR is 1.86. Normal LFTs. Hep C antibody is negative. -Reportedly cirrhosis secondary to previous lymphoma infiltration to the liver and R-CHOP chemotherapy.  Constipation Resolved. Senna S daily and Suppository (PRN)  Pancytopenia -Including leukopenia, anemia and thrombocytopenia. -Likely multifactorial from bone marrow suppression from AIDS/cirrhosis and hypersplenism.  Severe protein calorie malnutrition -Dietitian to see, his BMI is in the overweight range, his albumin is at 2.0.  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 -Had bone marrow biopsy done on 11/02/2013 in the [facility], showed no evidence of recurrent disease  Code Status: Full code Family Communication: Plan discussed with the patient. Disposition Plan: Remains inpatient will d/c to SNF when appropriate.   Consultants:  Infectious disease.  Procedures:  None  Antibiotics:  Micafungin 10/17.  Daptomycin continued since discharge last time.   Objective: Filed Vitals:   03/27/14 1419  BP: 112/67  Pulse: 102  Temp: 98.8 F (37.1 C)  Resp: 18    Intake/Output Summary (Last 24 hours) at 03/27/14 1432 Last data filed at 03/27/14 1420  Gross per 24 hour  Intake    240 ml  Output   1425 ml  Net  -1185 ml   Filed Weights   03/23/14 1241 03/24/14 0057  Weight: 74.844 kg (165 lb) 79.198 kg (174 lb 9.6 oz)    Exam: General: Alert and awake, oriented x3, not in any acute distress. Appears mildly jaundiced  HEENT: anicteric sclera, pupils reactive to light and accommodation, EOMI CVS: S1-S2 clear, no murmur rubs or gallops Chest: clear to auscultation bilaterally, no wheezing, rales or rhonchi Abdomen: soft tender to palpation on the left flank, nondistended, normal  bowel sounds, no organomegaly Extremities: no cyanosis, clubbing or edema noted bilaterally Neuro: Cranial nerves II-XII  intact, no focal neurological deficits  Data Reviewed: Basic Metabolic Panel:  Recent Labs Lab 03/23/14 1403 03/24/14 0145 03/25/14 0514  NA 133* 135* 135*  K 4.2 4.0 4.1  CL 104 104 106  CO2 _0 GLUCOSE 106* 89 95  BUN _1 CREATININE 0.43* 0.44* 0.42*  CALCIUM 8.8 8.4 8.4   Liver Function Tests:  Recent Labs Lab 03/24/14 0145 03/25/14 0514  AST 21 22  ALT 11 11  ALKPHOS 112 104  BILITOT 1.1 1.1  PROT 7.6 7.4  ALBUMIN 2.0* 2.0*   CBC:  Recent Labs Lab 03/23/14 1403 03/24/14 0145 03/25/14 0514 03/26/14 0559 03/27/14 0525  WBC 2.9* 2.2* 2.2* 2.3* 2.1*  NEUTROABS 1.7  --   --   --   --   HGB 9.7* 8.6* 8.1* 8.2* 8.6*  HCT 28.0* 25.7* 23.7* 24.0* 25.0*  MCV 96.2 99.2 96.3 94.9 97.3  PLT 115* 82* 78* 85* 87*   Cardiac Enzymes:  Recent Labs Lab 03/25/14 0514  CKTOTAL 21    Micro Recent Results (from the past 240 hour(s))  SURGICAL PCR SCREEN     Status: None   Collection Time    03/24/14  1:51 AM      Result Value Ref Range Status   MRSA, PCR NEGATIVE  NEGATIVE Final   Staphylococcus aureus NEGATIVE  NEGATIVE Final   Comment:            The Xpert SA Assay (FDA     approved for NASAL specimens     in patients over 11 years of age),     is one component of     a comprehensive surveillance     program.  Test performance has     been validated by [employer] for patients greater     than or equal to 55 year old.     It is not intended     to diagnose infection nor to     guide or monitor treatment.     Studies: No results found.  Scheduled Meds: . atovaquone  750 mg Oral Daily  . azithromycin  1,200 mg Oral Q Sat  . buPROPion  300 mg Oral Daily  . clonazePAM  1 mg Oral QHS  . DAPTOmycin (CUBICIN)  IV  630 mg Intravenous Q24H  . Darunavir Ethanolate  800 mg Oral Q breakfast  . emtricitabine-tenofovir  1 tablet Oral QHS  . enoxaparin (LOVENOX) injection  40 mg Subcutaneous Q24H  . feeding supplement (ENSURE COMPLETE)  237 mL  Oral BID BM  . gabapentin  400 mg Oral TID  . micafungin [facility]) IV  150 mg Intravenous Daily  . morphine  60 mg Oral Q12H  . polyethylene glycol  17 g Oral Daily  . ritonavir  100 mg Oral Q breakfast  . senna-docusate  2 tablet Oral Daily  . sucralfate  1 g Oral TID WC   Continuous Infusions: . sodium chloride 10 mL/hr at 03/27/14 4825       Time spent: 35 minutes    Karen Kitchens  [facility] Pager [phone] If 7PM-7AM, please contact night-coverage at [email or web address], password [facility] 03/27/2014, 2:32 PM  LOS: 4 days

## 2014-03-27 NOTE — Progress Notes (Signed)
RN entered pt room iv beeping, pt sleeping soundly. RN awoke pt for scheduled med, pt states pain at a 5 and returns to sleep. Will continue to monitor pt.

## 2014-03-27 NOTE — Progress Notes (Signed)
Addendum  Patient seen and examined, chart and data base reviewed.  I agree with the above assessment and plan.  For full details please see Mrs. Imogene Burn PA note.  Patient with AIDS, history of large B-cell lymphoma and cirrhosis presented with recurrent discitis.  Currently on micafungin and daptomycin, ID following, optimize pain medication regimen, to SNF when bed available.   Birdie Hopes, MD [facility] Pager: [phone] 03/27/2014, 5:16 PM

## 2014-03-27 NOTE — Progress Notes (Signed)
UR completed. Tera Pellicane RN CCM Case Mgmt phone [phone] 

## 2014-03-28 MED ORDER — MORPHINE SULFATE 15 MG PO TABS
15.0000 mg | ORAL_TABLET | ORAL | Status: DC | PRN
  Administered 2014-03-28 – 2014-03-31 (×5): 15 mg via ORAL
  Filled 2014-03-28 (×6): qty 1

## 2014-03-28 MED ORDER — MORPHINE SULFATE 15 MG PO TABS
15.0000 mg | ORAL_TABLET | ORAL | Status: DC | PRN
  Filled 2014-03-28: qty 1

## 2014-03-28 MED ORDER — MORPHINE SULFATE 15 MG PO TABS: 30.0000 mg | ORAL_TABLET | ORAL | Status: DC | PRN

## 2014-03-28 MED ORDER — BISACODYL 10 MG RE SUPP
10.0000 mg | Freq: Once | RECTAL | Status: DC
  Filled 2014-03-28: qty 1

## 2014-03-28 MED ORDER — MORPHINE SULFATE ER 30 MG PO TBCR
75.0000 mg | EXTENDED_RELEASE_TABLET | Freq: Two times a day (BID) | ORAL | Status: DC
  Administered 2014-03-28 – 2014-04-07 (×19): 75 mg via ORAL
  Filled 2014-03-28 (×3): qty 2
  Filled 2014-03-28: qty 1
  Filled 2014-03-28: qty 2
  Filled 2014-03-28: qty 1
  Filled 2014-03-28: qty 2
  Filled 2014-03-28 (×4): qty 1
  Filled 2014-03-28 (×2): qty 2
  Filled 2014-03-28 (×2): qty 1
  Filled 2014-03-28 (×3): qty 2
  Filled 2014-03-28: qty 1
  Filled 2014-03-28: qty 2
  Filled 2014-03-28 (×2): qty 1
  Filled 2014-03-28 (×6): qty 2
  Filled 2014-03-28: qty 1
  Filled 2014-03-28: qty 2
  Filled 2014-03-28: qty 1
  Filled 2014-03-28: qty 2
  Filled 2014-03-28 (×4): qty 1
  Filled 2014-03-28: qty 2
  Filled 2014-03-28 (×3): qty 1
  Filled 2014-03-28: qty 2

## 2014-03-28 MED ORDER — BISACODYL 10 MG RE SUPP
10.0000 mg | Freq: Once | RECTAL | Status: AC
  Administered 2014-03-28: 10 mg via RECTAL

## 2014-03-28 NOTE — Care Management Note (Addendum)
    Page 1 of 2   04/20/2014     4:13:07 PM CARE MANAGEMENT NOTE 04/20/2014  Patient:  TIRSO, LAWS   Account Number:  [id number]  Date Initiated:  03/27/2014  Documentation initiated by:  [facility]  Subjective/Objective Assessment:   candidal discitis     Action/Plan:   SNF  11/9 microdesctomy   Anticipated DC Date:  04/18/2014   Anticipated DC Plan:  SKILLED NURSING FACILITY  In-house referral  Clinical Social Worker      DC Planning Services  CM consult      Choice offered to / List presented to:             Status of service:  Completed, signed off Medicare Important Message given?  NO (If response is "NO", the following Medicare IM given date fields will be blank) Date Medicare IM given:   Medicare IM given by:   Date Additional Medicare IM given:   Additional Medicare IM given by:    Discharge Disposition:  [place]  Per UR Regulation:  Reviewed for med. necessity/level of care/duration of stay  If discussed at [place] of Stay Meetings, dates discussed:   04/10/2014  03/08/2014    Comments:  04/20/14 [place] is for snf possible monday   04/17/14 [place], BSN [phone] post of day 1, patient up ambulating, plan is for SNF at [facility], patient has a bed per CSW.  04/10/14 [place], BSN [phone] still awaiting bed at [facility].  04/06/14 [place] BSN [phone] NCM spoke with Pam with [facility] to see how much it would be for patient on cubucin she states it will bd $3 per week.  NCM called Rosaria Ferries the POA to see if patient would be able to stay with her to finish getting the iv abx, she states she can not let him stay with her because her daughter lives with her and her kids, she rather for patient to go to snf. NCM asked if there was someone else he could stay with, she states he has no one else and his family will have nothing to do with him.  04/02/14 [place] RN, BSN [phone] awaiting snf bed, CSW following, awaiting bed at [facility].  03/30/14 [place], BSN [phone] patient is for snf placement at [facility] when a bed becomes available.  Patient is applying for longterm medicaid for snf.  CSW following.  03/28/14 [place], BSN [phone] NCM spoke with patient, informed him that I was told he was interested in ltac, informed patient that medicaid does not cover ltac so this would not be an option for him, he understood, so the plan remains snf.  Plan dc to SNF when medically stable. CSW following for placement. Jonnie Finner RN CCM Case Mgmt phone [phone]

## 2014-03-28 NOTE — Clinical Social Work Note (Signed)
CSW has followed up with all "considering" facilities. At this time patient does not have any other bed offers from other facilities.   Liz Beach MSW, [place], [place], [phone]

## 2014-03-28 NOTE — Progress Notes (Signed)
Pt not respond to tech when tech went to take pt's vitals. Pt slow to answer questions. Tech call nurse into room. Nurse eventually get patient to answer questions like name, birthdate, place, and year. Pt A&Ox4 - just slow to respond. Rapid response nurse Jerene Pitch called to assess patient. Pt had just received scheduled pain medicine and klonipin. Will continue to monitor. Pt in no s/s of distress at this time.

## 2014-03-28 NOTE — Progress Notes (Addendum)
    [facility] for Infectious Disease    Date of Admission:  03/23/2014   Total days of antibiotics 6        Day 6 dapto        Day 6 mica           ID: Samuel Schultz is a 38 y.o. 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 He was previously treated with cefepime, fluconazole and daptomycin. He was in skilled nursing facility with recurrent fevers, repeat imaging suggests worsening process of L5-S1 and new areas involvement at T10-11. He has hx of Bcell lymphoma treated with RCHOP. Last BM in 10/2013 showed no signs of recurrence. Has cirrhosis as sequelae of treatment. Principal Problem:   Diskitis Active Problems:   AIDS   Cirrhosis, non-alcoholic   Pancytopenia   Protein-calorie malnutrition, severe   Abscess in epidural space of lumbar spine   Infectious discitis    Subjective: Afebrile, reportedly was solomnent from sedation side effect of pain meds  Medications:  . atovaquone  750 mg Oral Daily  . azithromycin  1,200 mg Oral Q Sat  . bisacodyl  10 mg Rectal Once  . buPROPion  300 mg Oral Daily  . clonazePAM  1 mg Oral QHS  . DAPTOmycin (CUBICIN)  IV  630 mg Intravenous Q24H  . Darunavir Ethanolate  800 mg Oral Q breakfast  . emtricitabine-tenofovir  1 tablet Oral QHS  . enoxaparin (LOVENOX) injection  40 mg Subcutaneous Q24H  . feeding supplement (ENSURE COMPLETE)  237 mL Oral BID BM  . gabapentin  400 mg Oral TID  . micafungin [facility]) IV  150 mg Intravenous Daily  . morphine  60 mg Oral Q12H  . polyethylene glycol  17 g Oral Daily  . ritonavir  100 mg Oral Q breakfast  . senna-docusate  2 tablet Oral Daily  . sucralfate  1 g Oral TID WC    Objective: Vital signs in last 24 hours: Temp:  [98.5 F (36.9 C)-99.1 F (37.3 C)] 98.5 F (36.9 C) (10/21 0544) Pulse Rate:  [102-105] 105 (10/21 0544) Resp:  [18] 18 (10/21 0544) BP:  (112-128)/(66-80) 122/66 mmHg (10/21 0544) SpO2:  [95 %-96 %] 95 % (10/21 0544) Did not examine  Lab Results  Recent Labs  03/26/14 0559 03/27/14 0525  WBC 2.3* 2.1*  HGB 8.2* 8.6*  HCT 24.0* 25.0*   Liver Panel No results found for this basename: PROT, ALBUMIN, AST, ALT, ALKPHOS, BILITOT, BILIDIR, IBILI,  in the last 72 hours  Assessment/Plan: Worsening discitis =Continue with micafungin and daptomycin at 8mg /kg. Repeat aspirate not likely to provide any useful information after discussion with neuroradiology. Would consider repeat lumbar mri in 3-4 wk to see if any significant improvement from change in therapy  Back pain = consider better oral medicaitons +/- utility of back brace to help with pain.   hiv = continue on current regimen of truvada/DRVr  oi proph = continue on weekly azithromycin and daily atovaquone  Will have him follow up in ID clinic  [facility], [facility] Cell: [phone] Pager: [phone]  03/28/2014, 10:22 AM

## 2014-03-28 NOTE — Progress Notes (Signed)
[facility] PROGRESS NOTE   Heston Widener KXF:[id number] DOB: 12/12/1975 DOA: 03/23/2014 PCP: [name], MD  HPI/Subjective: Complaining of severe pain particularly with movement (when he tries to go to the bathroom or bathe)  Assessment/Plan: Principal Problem:   Diskitis Active Problems:   AIDS   Cirrhosis, non-alcoholic   Pancytopenia   Protein-calorie malnutrition, severe   Abscess in epidural space of lumbar spine   Infectious discitis  Acute discitis -EDP discussed with Neurosurgery who recommended ID consultation and antibiotics for now - not a good surgical candidate. -Recent h/o L3-4 discitis and L5-S1 abscess, came in with new discitis at T10-11, worsening L5-S1 abscess, and persistent  L3-4 discitis   -Patient had VRE  (11/11/2013) and ampicillin sensitive enterococcus (11/10/2013)  in bld cultures. -Candida Albicans previously found in L5 - S1 abscess. -Discussed with both ID and IR, aspirating the disc spaces not going to change management now, will hold. -Patient discharged on cefepime, daptomycin and fluconazole.  Seen by ID, placed on Micafungin and daptomycin 8 mg/kg.  -Per ID:  Would consider repeat lumbar mri in 3-4 wks to see if any significant improvement from change in therapy -Back brace ordered for stability and to reduce pain.  Severe Pain -Secondary to diskitis with a component of anxiety. -Increased long acting MS Contin to 75 mg q 12.   Short acting MSIR remains the same at 15 mg q 4 -On Klonopin QHS -Back brace added for support  AIDS/HIV infection -Absolute CD4 count is 40 on 01/27/14. -Patient is on Truvada, Prezista and Norvir. -Azithromycin for prophylaxis. -Infectious Disease is on board.  Cirrhosis -Radiological evidence of cirrhosis and portal hypertension, with massive splenomegaly and esophageal varices. -His INR is 1.86. Normal LFTs. Hep C antibody is negative. -Reportedly cirrhosis secondary to previous lymphoma infiltration  to the liver and R-CHOP chemotherapy.  Constipation Resolved. Senna S daily and Suppository (PRN)  Pancytopenia -Including leukopenia, anemia and thrombocytopenia. -Likely multifactorial from bone marrow suppression from AIDS/cirrhosis and hypersplenism.  Severe protein calorie malnutrition -Dietitian to see, his BMI is in the overweight range, his albumin is at 2.0.  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 -Had bone marrow biopsy done on 11/02/2013 in the [facility], showed no evidence of recurrent disease  Code Status: Full code Family Communication: Plan discussed with the patient. Disposition Plan: Remains inpatient will d/c to SNF when appropriate.   Consultants:  Infectious disease.  Procedures:  None  Antibiotics:  Micafungin 10/17.  Daptomycin continued since discharge last time.   Objective: Filed Vitals:   03/28/14 0544  BP: 122/66  Pulse: 105  Temp: 98.5 F (36.9 C)  Resp: 18    Intake/Output Summary (Last 24 hours) at 03/28/14 1420 Last data filed at 03/28/14 1058  Gross per 24 hour  Intake 1422.4 ml  Output    750 ml  Net  672.4 ml   Filed Weights   03/23/14 1241 03/24/14 0057  Weight: 74.844 kg (165 lb) 79.198 kg (174 lb 9.6 oz)    Exam: General: Alert and awake, oriented x3, not in any acute distress. Appears mildly jaundiced  HEENT: anicteric sclera, pupils reactive to light and accommodation, EOMI CVS: S1-S2 clear, no murmur rubs or gallops Chest: clear to auscultation bilaterally, no wheezing, rales or rhonchi Abdomen: soft tender to palpation on the left flank, nondistended, normal bowel sounds, no organomegaly Extremities: no cyanosis, clubbing or edema noted bilaterally Neuro: Cranial nerves II-XII intact, no  focal neurological deficits  Data Reviewed: Basic Metabolic Panel:  Recent Labs Lab 03/23/14 1403 03/24/14 0145 03/25/14 0514  NA 133* 135* 135*    K 4.2 4.0 4.1  CL 104 104 106  CO2 21 23 22   GLUCOSE 106* 89 95  BUN 11 9 8   CREATININE 0.43* 0.44* 0.42*  CALCIUM 8.8 8.4 8.4   Liver Function Tests:  Recent Labs Lab 03/24/14 0145 03/25/14 0514  AST 21 22  ALT 11 11  ALKPHOS 112 104  BILITOT 1.1 1.1  PROT 7.6 7.4  ALBUMIN 2.0* 2.0*   CBC:  Recent Labs Lab 03/23/14 1403 03/24/14 0145 03/25/14 0514 03/26/14 0559 03/27/14 0525  WBC 2.9* 2.2* 2.2* 2.3* 2.1*  NEUTROABS 1.7  --   --   --   --   HGB 9.7* 8.6* 8.1* 8.2* 8.6*  HCT 28.0* 25.7* 23.7* 24.0* 25.0*  MCV 96.2 99.2 96.3 94.9 97.3  PLT 115* 82* 78* 85* 87*   Cardiac Enzymes:  Recent Labs Lab 03/25/14 0514  CKTOTAL 21    Micro Recent Results (from the past 240 hour(s))  SURGICAL PCR SCREEN     Status: None   Collection Time    03/24/14  1:51 AM      Result Value Ref Range Status   MRSA, PCR NEGATIVE  NEGATIVE Final   Staphylococcus aureus NEGATIVE  NEGATIVE Final   Comment:            The Xpert SA Assay (FDA     approved for NASAL specimens     in patients over 64 years of age),     is one component of     a comprehensive surveillance     program.  Test performance has     been validated by [employer] for patients greater     than or equal to 64 year old.     It is not intended     to diagnose infection nor to     guide or monitor treatment.     Studies: No results found.  Scheduled Meds: . atovaquone  750 mg Oral Daily  . azithromycin  1,200 mg Oral Q Sat  . bisacodyl  10 mg Rectal Once  . bisacodyl  10 mg Rectal Once  . buPROPion  300 mg Oral Daily  . clonazePAM  1 mg Oral QHS  . DAPTOmycin (CUBICIN)  IV  630 mg Intravenous Q24H  . Darunavir Ethanolate  800 mg Oral Q breakfast  . emtricitabine-tenofovir  1 tablet Oral QHS  . enoxaparin (LOVENOX) injection  40 mg Subcutaneous Q24H  . feeding supplement (ENSURE COMPLETE)  237 mL Oral BID BM  . gabapentin  400 mg Oral TID  . micafungin [facility]) IV  150 mg Intravenous Daily   . morphine  75 mg Oral Q12H  . polyethylene glycol  17 g Oral Daily  . ritonavir  100 mg Oral Q breakfast  . senna-docusate  2 tablet Oral Daily  . sucralfate  1 g Oral TID WC   Continuous Infusions: . sodium chloride 10 mL/hr at 03/27/14 4975     Time spent: 35 minutes  Karen Kitchens  [facility] Pager [phone] If 7PM-7AM, please contact night-coverage at [email or web address], password [facility] 03/28/2014, 2:20 PM  LOS: 5 days   Attending Patient was seen, examined,treatment plan was discussed with the  Advance Practice Provider.  I have directly reviewed the clinical findings, lab, imaging studies and management of this patient  in detail. I have made the necessary changes to the above noted documentation, and agree with the documentation, as recorded by the Advance Practice Provider.   38 year old with HIV AIDS, CD4 count of 40, history of Candida discitis,VRE, amp S Enterococcus, bacteremia admitted with recurrent fevers, repeat MRI suggests worsening processes and L5/S1 and new areas of involvement in 2010/11. Infectious disease following, and directing antimicrobial therapy. Continues to complain of worsening pain, increase MS Contin to 75 mg twice a day, continue to use MSIR for breakthrough pain. Will slowly increase/titrate narcotics. Suspect back to SNF tomorrow.   Nena Alexander MD [facility].

## 2014-03-28 NOTE — Progress Notes (Signed)
[facility] Progress Note Patient Details:  Samuel Schultz Apr 27, 1976 [id number] Called in order to [employer]. Patient ID: Samuel Schultz, male   DOB: [birth year], 38 y.o.   MRN: [id number]   Samuel Schultz 03/28/2014, 3:27 PM

## 2014-03-29 MED ORDER — CLONAZEPAM 0.5 MG PO TABS: ORAL_TABLET | ORAL | Status: DC

## 2014-03-29 MED ORDER — POLYETHYLENE GLYCOL 3350 17 G PO PACK: 17.0000 g | PACK | Freq: Every day | ORAL | Status: AC

## 2014-03-29 MED ORDER — MORPHINE SULFATE ER 15 MG PO TBCR: 75.0000 mg | EXTENDED_RELEASE_TABLET | Freq: Two times a day (BID) | ORAL | Status: DC

## 2014-03-29 MED ORDER — ENSURE COMPLETE PO LIQD: 237.0000 mL | Freq: Two times a day (BID) | ORAL | Status: DC

## 2014-03-29 MED ORDER — CLONAZEPAM 1 MG PO TABS: 1.0000 mg | ORAL_TABLET | Freq: Every day | ORAL | Status: DC

## 2014-03-29 MED ORDER — SODIUM CHLORIDE 0.9 % IV SOLN: 630.0000 mg | INTRAVENOUS | Status: DC

## 2014-03-29 MED ORDER — SODIUM CHLORIDE 0.9 % IV SOLN: 150.0000 mg | Freq: Every day | INTRAVENOUS | Status: DC

## 2014-03-29 MED ORDER — MORPHINE SULFATE 15 MG PO TABS: 15.0000 mg | ORAL_TABLET | ORAL | Status: DC | PRN

## 2014-03-29 MED ORDER — SENNOSIDES-DOCUSATE SODIUM 8.6-50 MG PO TABS: 2.0000 | ORAL_TABLET | Freq: Every day | ORAL | Status: AC

## 2014-03-29 NOTE — Clinical Social Work Note (Signed)
CSW met with patient at bedside along with RNCM to explain patient's discharge options. CSW explained to patient that CSW has been informed that the patient has refused to apply for long term medicaid and that this must be applied for if he plans to DC to a SNF. Patient became notably upset. Patient asked CSW to speak with HPOA Rosaria Ferries. Rosaria Ferries states that the [employer] social worker needs an [facility] faxed over to his office so that the long term medicaid application and be submitted. CSW has contacted the medicaid social worker Tia Alert [phone] and has confirmed with Reuda that this is all that is needed for the long term medicaid application to be started. Reuda states that he can submit this once received. CSW has faxed [phone])signed FL2 to Reuda and has received a fax confirmation sheet. CSW supervisor is currently assisting with finding placement for patient. CSW has updated patient on situation.   Liz Beach MSW, [place], [place], [phone]

## 2014-03-29 NOTE — Clinical Social Work Note (Signed)
At this time the patient has not received any bed offers. CSW supervisor and CSW continue to search for placement for patient as he has agreed to submit for long term care Medicaid. CSW will continue to assist with discharge. Patient and HPOA aware of situation.  Liz Beach MSW, [place], [place], [phone]

## 2014-03-29 NOTE — Progress Notes (Signed)
PT Cancellation Note  Patient Details Name: Samuel Schultz MRN: [id number] DOB: 08/17/1975   Cancelled Treatment:    Reason Eval/Treat Not Completed: Patient declined, no reason specified  Pt declined participating in therapy today reporting he just got up to use the bathroom and brush his teeth and "just doesn't feel like it." Per MD, notes, to be d/ced later today. Will follow up next available time if pt still in hospital.   Candy Sledge A 03/29/2014, 11:50 AM Candy Sledge, PT, DPT [phone]

## 2014-03-29 NOTE — Discharge Summary (Addendum)
PATIENT DETAILS Name: Samuel Schultz Age: 38 y.o. Sex: male Date of Birth: 1975-10-07 MRN: [id number]. Admitting Physician: Oswald Hillock, MD QAS:TMHD, Olin Hauser, MD  Admit Date: 03/23/2014 Discharge date: 03/29/2014  Recommendations for Outpatient Follow-up:  1. 6 weeks of IV daptomycin and micafungin from 03/29/14. Will need followup with infectious disease clinic in the next 2-3 weeks, will also likely need a repeat MRI of his thoracic/lumbar spine in the next 2-3 weeks. 2. Please check weekly CK,BMET while on above antibiotics-please fax results to [facility].  PRIMARY DISCHARGE DIAGNOSIS:  Principal Problem:   Diskitis Active Problems:   AIDS   Cirrhosis, non-alcoholic   Pancytopenia   Protein-calorie malnutrition, severe   Abscess in epidural space of lumbar spine   Infectious discitis      PAST MEDICAL HISTORY: Past Medical History  Diagnosis Date  . HIV disease   . Cirrhosis   . PTSD (post-traumatic stress disorder)   . Depressive disorder   . Cancer   . Lymphoma   . Anemia     DISCHARGE MEDICATIONS:   Medication List    STOP taking these medications       ceFEPIme 2 g in dextrose 5 % 50 mL     cyclobenzaprine 5 MG tablet  Commonly known as:  FLEXERIL     fluconazole 200 MG tablet  Commonly known as:  DIFLUCAN     potassium chloride SA 20 MEQ tablet  Commonly known as:  K-DUR,KLOR-CON     temazepam 7.5 MG capsule  Commonly known as:  RESTORIL      TAKE these medications       atovaquone 750 MG/5ML suspension  Commonly known as:  MEPRON  Take 750 mg by mouth daily.     azithromycin 600 MG tablet  Commonly known as:  ZITHROMAX  Take 1,200 mg by mouth once a week. saturday     buPROPion 300 MG 24 hr tablet  Commonly known as:  WELLBUTRIN XL  Take 300 mg by mouth daily.     clonazePAM 0.5 MG tablet  Commonly known as:  KLONOPIN  Take one tablet by mouth twice daily as needed for anxiety     clonazePAM 1 MG tablet  Commonly known as:   KLONOPIN  Take 1 tablet (1 mg total) by mouth at bedtime.     DAPTOmycin 630 mg in sodium chloride 0.9 % 100 mL  Inject 630 mg into the vein daily. For 6 more weeks from 03/29/14.     Darunavir Ethanolate 800 MG tablet  Commonly known as:  PREZISTA  Take 1 tablet (800 mg total) by mouth daily.     emtricitabine-tenofovir 200-300 MG per tablet  Commonly known as:  TRUVADA  Take 1 tablet by mouth at bedtime.     feeding supplement (ENSURE COMPLETE) Liqd  Take 237 mLs by mouth 2 (two) times daily between meals.     ferrous sulfate 325 (65 FE) MG tablet  Take 1 tablet (325 mg total) by mouth 2 (two) times daily with a meal.     gabapentin 400 MG capsule  Commonly known as:  NEURONTIN  Take 400 mg by mouth 3 (three) times daily.     methocarbamol 500 MG tablet  Commonly known as:  ROBAXIN  Take 1 tablet (500 mg total) by mouth every 6 (six) hours as needed for muscle spasms.     micafungin 150 mg in sodium chloride 0.9 % 100 mL  Inject 150 mg into the vein daily.  For 6 more weeks from 03/29/14     morphine 15 MG 12 hr tablet  Commonly known as:  MS CONTIN  Take 5 tablets (75 mg total) by mouth every 12 (twelve) hours.     morphine 15 MG tablet  Commonly known as:  MSIR  Take 1 tablet (15 mg total) by mouth every 4 (four) hours as needed for severe pain.     omeprazole 20 MG capsule  Commonly known as:  PRILOSEC  Take 20 mg by mouth 2 (two) times daily.     polyethylene glycol packet  Commonly known as:  MIRALAX / GLYCOLAX  Take 17 g by mouth daily.     promethazine 12.5 MG tablet  Commonly known as:  PHENERGAN  Take 1 tablet (12.5 mg total) by mouth every 6 (six) hours as needed for nausea or vomiting.     ritonavir 100 MG capsule  Commonly known as:  NORVIR  Take 1 capsule (100 mg total) by mouth daily with breakfast. With Prezista     senna-docusate 8.6-50 MG per tablet  Commonly known as:  Senokot-S  Take 2 tablets by mouth daily.     sucralfate 1 G tablet    Commonly known as:  CARAFATE  Take 1 g by mouth 3 (three) times daily with meals.        ALLERGIES:   Allergies  Allergen Reactions  . Codeine Anaphylaxis, Hives, Nausea And Vomiting, Nausea Only and Swelling  . Oxycodone Other (See Comments)    Other reaction(s): Dizziness Lightheaded, nausea    BRIEF HPI:  See H&P, Labs, Consult and Test reports for all details in brief, is a 38 y.o. male with history of lymphoma, AIDS, cirrhosis of the liver who had presented in June with N/V and fever and cultures grew VRE, C albicans. He was treated with daptomycin and fluconazole for 14 days then returned in July with fever and chills, diagnosed with L3-4 discitis. He was discharged on fluconazole, vancomycin and cefepime, he presented to the hospital with worsening back pain, and MRI of his spine noted new area of osteomyelitis T10-T11, and worsening of L5-S1.   CONSULTATIONS:   ID  PERTINENT RADIOLOGIC STUDIES: Dg Chest 2 View  03/14/2014   ADDENDUM REPORT: 03/14/2014 16:56  ADDENDUM: I returned the patient's phone call at 16 50 on 03/14/2014. As I discussed with him, mild compression of the T11 vertebral body appears stable. There is also mild compression of the T10 vertebral body. To some degree this was present on CT scan performed 10/22/2013, but not readily evident on 01/05/2014 radiograph. I believe there has been mild interval progression in the degree of T10 compression deformity, although still mild. As we discussed, an MRI could be considered if felt indicated to determine whether this may be a pathologic process related to metastasis. Also, we discussed the fact that no information regarding the left chest wall nodule was obtained on chest radiograph, as it was not visualized. Further characterization with CT or MRI could be considered. The patient indicated that he would confer with his referring physician.   Electronically Signed   By: Skipper Cliche M.D.   On: 03/14/2014 16:56    03/14/2014   CLINICAL DATA:  Initial evaluation for heart nodule medial aspect of left breast or chest wall since Sunday, personal history of stage IV lymphoma and HIV  EXAM: CHEST  2 VIEW  COMPARISON:  01/05/2014  FINDINGS: Right PICC line in unchanged position.  Limited inspiratory effect.  Heart  size upper normal but stable. Vascular pattern normal. Lungs clear. Bony thorax intact.  Stable mild T11 compression deformity. New mild T10 compression deformity.  IMPRESSION: No acute cardiopulmonary process. Would not necessarily expect for a soft tissue nodule to be visualized on chest radiograph. Consider further evaluation with ultrasound or CT scan for soft tissue nodule.  There appears to be new mild T10 compression deformity. This could be further evaluated with MRI of the spine. These results will be called to the ordering clinician or representative by the Radiologist Assistant, and communication documented in the PACS or zVision Dashboard.  Electronically Signed: By: Skipper Cliche M.D. On: 03/13/2014 16:46   Mr Lumbar Spine Wo Contrast  03/23/2014   CLINICAL DATA:  Multilevel infectious discitis. HIV infection. Lymphoma. Left flank pain of 2 days duration.  EXAM: MRI LUMBAR SPINE WITHOUT CONTRAST  TECHNIQUE: Multiplanar, multisequence MR imaging of the lumbar spine was performed. No intravenous contrast was administered.  COMPARISON:  CT scan same day.  Previous MRI 01/26/2014  FINDINGS: The scan covers the region from lower T9 through S3.  T9-10 does not appear involved.  T10-11: Discitis osteomyelitis at this level with fluid intensity material in the disc space and marrow edema throughout the T10 and T11 vertebral bodies. Slight loss of height at the endplates. No evidence of epidural abscess. No cord compression.  T11-12: No evidence of involvement at this level.  T12-L1 L1-2:  Normal.  L2-3: New abnormality on the left with a superior endplate Schmorl's node type pattern at L3. This could be  evidence of early disc space infection at this level or this could be an actual an related Schmorl's node.  L3-4: Persistent evidence of discitis osteomyelitis at this level with edema in the disc and vertebral bodies but no evidence of progression. No spinal abscess.  L4-5:  No evidence of involvement of this disc space.  L5-S1: Worsened pattern with fluid throughout the disc space and worsened edema within the L5 and S1 vertebral bodies. There appears to be either a left posterior lateral disc herniation with caudal migration or some epidural abscess on the left behind S1. The thecal sac is not compressed. There would be potential to affect the S1 nerve roots.  IMPRESSION: Newly seen discitis osteomyelitis at T10-11. Fluid in the disc space. Early collapse of the endplates. No spinal abscess or neural compression.  Newly seen abnormality on the left at L2-3 which looks like a superior endplate Schmorl's node but could be evidence of early infection developing at the L2-3 disc level.  Persistent osteomyelitis discitis at L3-4, similar to the previous study. No abscess or neural compression.  Marked worsening of the appearance at L5-S1 with fluid throughout the disc space, edema within the L5 and S1 vertebral bodies, an abnormal material in the ventral epidural space that could be extruded disc material or ventral epidural abscess. The central canal is not compressed but there would be potential to affect the S1 nerve roots.   Electronically Signed   By: Nelson Chimes M.D.   On: 03/23/2014 20:21   Ct Renal Stone Study  03/23/2014   CLINICAL DATA:  Left flank pain.  HIV, cirrhosis, lymphoma  EXAM: CT ABDOMEN AND PELVIS WITHOUT CONTRAST  TECHNIQUE: Multidetector CT imaging of the abdomen and pelvis was performed following the standard protocol without IV contrast.  COMPARISON:  CT 11/10/2013  FINDINGS: On today's study, neither oral nor IV contrast was administered limiting evaluation of the bowel.  Mild right  lower lobe  atelectasis with elevated right hemidiaphragm. Left lung base is clear.  Advanced cirrhotic liver. Prior cholecystectomy. Marked splenomegaly unchanged. Spleen measures 17 x 14 x 18 cm. Periportal adenopathy seen on the prior study difficult to evaluate on today's study due to lack of oral and IV contrast. Probably no change.  The kidneys are normal without obstruction or mass. No renal calculi. Urinary bladder normal.  Severe constipation with large amount of stool in the colon. No bowel obstruction. Previously noted thickening of the right colon not identified on today's study. This may have been infectious colitis which has resolved  No free fluid.  No pathologic adenopathy.  Progressive discitis at T10-11 with further bone loss and kyphosis. Progressive discitis and osteomyelitis at L3-4, and L5-S1 with sclerotic changes and endplate erosion. Epidural abscess not evaluated on unenhanced CT.  IMPRESSION: No renal calculi.  Advanced cirrhosis. Marked splenomegaly is unchanged. No free fluid.  Marked constipation.  Progressive discitis and osteomyelitis at T10-11, L3-4, L5-S1.   Electronically Signed   By: Franchot Gallo M.D.   On: 03/23/2014 15:34     PERTINENT LAB RESULTS: CBC:  Recent Labs  03/27/14 0525  WBC 2.1*  HGB 8.6*  HCT 25.0*  PLT 87*   CMET CMP     Component Value Date/Time   NA 135* 03/25/2014 0514   K 4.1 03/25/2014 0514   CL 106 03/25/2014 0514   CO2 22 03/25/2014 0514   GLUCOSE 95 03/25/2014 0514   BUN 8 03/25/2014 0514   CREATININE 0.42* 03/25/2014 0514   CALCIUM 8.4 03/25/2014 0514   PROT 7.4 03/25/2014 0514   ALBUMIN 2.0* 03/25/2014 0514   AST 22 03/25/2014 0514   ALT 11 03/25/2014 0514   ALKPHOS 104 03/25/2014 0514   BILITOT 1.1 03/25/2014 0514   GFRNONAA >90 03/25/2014 0514   GFRAA >90 03/25/2014 0514    GFR Estimated Creatinine Clearance: 121.1 ml/min (by C-G formula based on Cr of 0.42). No results found for this basename: LIPASE, AMYLASE,   in the last 72 hours No results found for this basename: CKTOTAL, CKMB, CKMBINDEX, TROPONINI,  in the last 72 hours No components found with this basename: POCBNP,  No results found for this basename: DDIMER,  in the last 72 hours No results found for this basename: HGBA1C,  in the last 72 hours No results found for this basename: CHOL, HDL, LDLCALC, TRIG, CHOLHDL, LDLDIRECT,  in the last 72 hours No results found for this basename: TSH, T4TOTAL, FREET3, T3FREE, THYROIDAB,  in the last 72 hours No results found for this basename: VITAMINB12, FOLATE, FERRITIN, TIBC, IRON, RETICCTPCT,  in the last 72 hours Coags: No results found for this basename: PT, INR,  in the last 72 hours Microbiology: Recent Results (from the past 240 hour(s))  SURGICAL PCR SCREEN     Status: None   Collection Time    03/24/14  1:51 AM      Result Value Ref Range Status   MRSA, PCR NEGATIVE  NEGATIVE Final   Staphylococcus aureus NEGATIVE  NEGATIVE Final   Comment:            The Xpert SA Assay (FDA     approved for NASAL specimens     in patients over 26 years of age),     is one component of     a comprehensive surveillance     program.  Test performance has     been validated by [employer] for patients greater  than or equal to 66 year old.     It is not intended     to diagnose infection nor to     guide or monitor treatment.     BRIEF HOSPITAL COURSE:  Brief Summary: Samuel Schultz is a 38 y.o. male with history of lymphoma, AIDS, cirrhosis of the liver who had presented in June 2015 with N/V and fever and cultures grew VRE, C albicans. He was treated with daptomycin and fluconazole for 14 days then returned in July 2015 with fever and chills, diagnosed with L3-4 discitis. No other positive culture grown and sent out on daptomycin and ceftazidime. He though has had continued problems, and returned in August and aspiration cultured C albicans. Sent out on fluconazole, daptomycin, cefepime. Saw Dr.  Tommy Medal in clinic to continue with the same. Now comes in with worsening pain and new area noted on MRI in T10-11 and worsening L5-S1. Patient was admitted, infectious disease was consulted. Case was discussed by the admitting physician (Dr. Darrick Meigs) with neurosurgery (Dr. Trenton Gammon), who reviewed patient's MRI and recommended IV antibiotics. Patient was then started on intravenous daptomycin and micafungin, plans are to continue for at least 6 weeks. This is his plans to closely follow patient, and a repeat MRI in the next 2-3 weeks.  Hospital course by problem list: Acute/worsening discitis:Recent h/o L3-4 discitis and L5-S1 abscess, came in with new discitis at T10-11, worsening L5-S1, and persistent L3-4 discitis while on IV cefepime, daptomycin and fluconazole which patient was receiving at a skilled nursing facility. Case was discussed with both infectious disease, and interventional radiology,aspirating the disc spaces not going to change management.Seen by ID, placed on Micafungin and daptomycin 8 mg/kg. Marland KitchenPlan is to continue with the above-noted antibiotics for at least 6 weeks if not more, patient will need very close followup with the infectious disease clinic, infectious disease planning to repeat MRI of the spine in the next clinic in 3 weeks. Patient will need weekly CK and chemistries done while in the above-noted antibiotics, and will need those results faxed to infectious disease clinic.  Severe back pain: Secondary to above, MS Contin increased to 75 mg every 12 hours-for the past one day has not required breakthrough medications. This morning claimed that the pain level is down to 5. Will continue on current narcotic regimen (see above) on discharge, will need close titration of medications depending on clinical response.  HIV AIDS: CD4 count 40 on 8/22, continue above noted antiretrovirals  Known history of cirrhosis: Stable, remains compensated.Reportedly cirrhosis secondary to previous  lymphoma infiltration to the liver and R-CHOP chemotherapy  Constipation:Resolved. On bowel regimen with MiraLax and Senokot.  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 Had bone marrow biopsy done on 11/02/2013 in the [facility], showed no evidence of recurrent disease  Pancytopenia:Including leukopenia, anemia and thrombocytopenia. Likely multifactorial from bone marrow suppression from    TODAY-DAY OF DISCHARGE:  Subjective:   Samuel Schultz today has no headache,no chest abdominal pain,no new weakness tingling or numbness, feels much better wants to go home today.   Objective:   Blood pressure 118/62, pulse 104, temperature 98.5 F (36.9 C), temperature source Oral, resp. rate 20, height 5' 8"  (1.727 m), weight 79.198 kg (174 lb 9.6 oz), SpO2 95.00%.  Intake/Output Summary (Last 24 hours) at 03/29/14 1623 Last data filed at 03/29/14 1403  Gross per 24 hour  Intake    480 ml  Output    150 ml  Net    330 ml   Filed Weights   03/23/14 1241 03/24/14 0057  Weight: 74.844 kg (165 lb) 79.198 kg (174 lb 9.6 oz)    Exam Awake Alert, Oriented *3, No new F.N deficits, Normal affect [place].AT,PERRAL Supple Neck,No JVD, No cervical lymphadenopathy appriciated.  Symmetrical Chest wall movement, Good air movement bilaterally, CTAB RRR,No Gallops,Rubs or new Murmurs, No Parasternal Heave +ve B.Sounds, Abd Soft, Non tender, No organomegaly appriciated, No rebound -guarding or rigidity. No Cyanosis, Clubbing or edema, No new Rash or bruise  DISCHARGE CONDITION: Stable  DISPOSITION: SNF  DISCHARGE INSTRUCTIONS:    Activity:  As tolerated with Full fall precautions use walker/cane & assistance as needed  Diet recommendation: Regular Diet  Discharge Instructions   Call MD for:  severe uncontrolled pain    Complete by:  As directed      Call  MD for:  temperature >100.4    Complete by:  As directed      Diet general    Complete by:  As directed      Increase activity slowly    Complete by:  As directed            Follow-up Information   Follow up with Bobby Rumpf, MD On 04/02/2014. (appt at 10 am)    Specialty:  Infectious Diseases   Contact information:   [address] [place]  [id number] [phone]     Total Time spent on discharge equals 45 minutes.  SignedOren Binet 03/29/2014 4:23 PM

## 2014-03-30 DIAGNOSIS — M869 Osteomyelitis, unspecified: Secondary | ICD-10-CM

## 2014-03-30 LAB — BASIC METABOLIC PANEL
Anion gap: 9 (ref 5–15)
BUN: 8 mg/dL (ref 6–23)
CO2: 24 mEq/L (ref 19–32)
Calcium: 9 mg/dL (ref 8.4–10.5)
Chloride: 102 mEq/L (ref 96–112)
Creatinine, Ser: 0.44 mg/dL — ABNORMAL LOW (ref 0.50–1.35)
GLUCOSE: 103 mg/dL — AB (ref 70–99)
Potassium: 3.7 mEq/L (ref 3.7–5.3)
SODIUM: 135 meq/L — AB (ref 137–147)

## 2014-03-30 LAB — CBC WITH DIFFERENTIAL/PLATELET
Basophils Absolute: 0 10*3/uL (ref 0.0–0.1)
Basophils Relative: 1 % (ref 0–1)
EOS ABS: 0.1 10*3/uL (ref 0.0–0.7)
Eosinophils Relative: 5 % (ref 0–5)
HCT: 24.8 % — ABNORMAL LOW (ref 39.0–52.0)
Hemoglobin: 8.5 g/dL — ABNORMAL LOW (ref 13.0–17.0)
LYMPHS ABS: 0.7 10*3/uL (ref 0.7–4.0)
Lymphocytes Relative: 32 % (ref 12–46)
MCH: 32.4 pg (ref 26.0–34.0)
MCHC: 34.3 g/dL (ref 30.0–36.0)
MCV: 94.7 fL (ref 78.0–100.0)
Monocytes Absolute: 0.3 10*3/uL (ref 0.1–1.0)
Monocytes Relative: 13 % — ABNORMAL HIGH (ref 3–12)
Neutro Abs: 1.1 10*3/uL — ABNORMAL LOW (ref 1.7–7.7)
Neutrophils Relative %: 50 % (ref 43–77)
Platelets: 91 10*3/uL — ABNORMAL LOW (ref 150–400)
RBC: 2.62 MIL/uL — ABNORMAL LOW (ref 4.22–5.81)
RDW: 15.8 % — ABNORMAL HIGH (ref 11.5–15.5)
WBC: 2.2 10*3/uL — AB (ref 4.0–10.5)

## 2014-03-30 LAB — CK: CK TOTAL: 36 U/L (ref 7–232)

## 2014-03-30 NOTE — Progress Notes (Signed)
[facility] for Infectious Disease    Date of Admission:  03/23/2014   Total days of antibiotics 7        Day 7 dapto        Day 7 mica           ID: Samuel Schultz is a 38 y.o. 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 He was previously treated with cefepime, fluconazole and daptomycin. He was in skilled nursing facility with recurrent fevers, repeat imaging suggests worsening process of L5-S1 and new areas involvement at T10-11. He has hx of Bcell lymphoma treated with RCHOP. Last BM in 10/2013 showed no signs of recurrence. Has cirrhosis as sequelae of treatment. Principal Problem:   Diskitis Active Problems:   AIDS   Cirrhosis, non-alcoholic   Pancytopenia   Protein-calorie malnutrition, severe   Abscess in epidural space of lumbar spine   Infectious discitis    Subjective: Afebrile, better pain control. Denies constipation.  Medications:  . atovaquone  750 mg Oral Daily  . azithromycin  1,200 mg Oral Q Sat  . buPROPion  300 mg Oral Daily  . clonazePAM  1 mg Oral QHS  . DAPTOmycin (CUBICIN)  IV  630 mg Intravenous Q24H  . Darunavir Ethanolate  800 mg Oral Q breakfast  . emtricitabine-tenofovir  1 tablet Oral QHS  . enoxaparin (LOVENOX) injection  40 mg Subcutaneous Q24H  . feeding supplement (ENSURE COMPLETE)  237 mL Oral BID BM  . gabapentin  400 mg Oral TID  . micafungin [facility]) IV  150 mg Intravenous Daily  . morphine  75 mg Oral Q12H  . polyethylene glycol  17 g Oral Daily  . ritonavir  100 mg Oral Q breakfast  . senna-docusate  2 tablet Oral Daily  . sucralfate  1 g Oral TID WC    Objective: Vital signs in last 24 hours: Temp:  [98.5 F (36.9 C)-99.1 F (37.3 C)] 98.5 F (36.9 C) (10/23 0743) Pulse Rate:  [100-104] 100 (10/23 0743) Resp:  [17-20] 17 (10/23 0743) BP: (117-127)/(62-68) 117/65 mmHg (10/23 0743) SpO2:  [94 %-96 %]  94 % (10/23 0743) Weight:  [171 lb 11.8 oz (77.9 kg)] 171 lb 11.8 oz (77.9 kg) (10/23 0743) Physical Exam  Constitutional: He is oriented to person, place, and time. He appears well-developed and well-nourished. No distress.  HENT:  Mouth/Throat: Oropharynx is clear and moist. No oropharyngeal exudate.  Cardiovascular: Normal rate, regular rhythm and normal heart sounds. Exam reveals no gallop and no friction rub.  No murmur heard.  Pulmonary/Chest: Effort normal and breath sounds normal. No respiratory distress. He has no wheezes.  Abdominal: Soft. Bowel sounds are normal. He exhibits no distension. There is no tenderness.  Lymphadenopathy:  He has no cervical adenopathy.  Neurological: He is alert and oriented to person, place, and time.  Skin: Skin is warm and dry. No rash noted. No erythema.  Psychiatric: He has a normal mood and affect. His behavior is normal.    Lab Results No results found for this basename: WBC, HGB, HCT, PLATELETS, NA, K, CL, CO2, BUN, CREATININE, GLU,  in the last 72 hours Liver Panel No results found for this basename: PROT, ALBUMIN, AST, ALT, ALKPHOS, BILITOT, BILIDIR, IBILI,  in the last 72 hours  Assessment/Plan: Worsening discitis =Continue with micafungin and daptomycin at 8mg /kg. Repeat aspirate not likely to provide any  useful information after discussion with neuroradiology. Would consider repeat lumbar mri in 3-4 wk to see if any significant improvement from change in therapy. Will recommend to treat for 6wk  While on daptomycin. Will need weekly CK plus weekly cbc with diff,and  CMP. Will check CK,cbc, and cmp today  Back pain = consider better oral medicaitons +/- utility of back brace to help with pain.   hiv = continue on current regimen of truvada/DRVr  oi proph = continue on weekly azithromycin and daily atovaquone  Will have him follow up in ID clinic. Will check in on Monday if still here. Dr. Johnnye Sima available for [place] over the  weekend  [facility], [facility] for Infectious Diseases Cell: [phone] Pager: [phone]  03/30/2014, 11:31 AM

## 2014-03-30 NOTE — Progress Notes (Signed)
PROGRESS NOTE  Samuel Schultz ZHG:[id number] DOB: 12/18/1975 DOA: 03/23/2014 PCP: [name], MD  HPI/Subjective: Severe pain particularly with movement to use the bathroom or bathe.    Assessment/Plan: Acute discitis:Recent h/o L3-4 discitis and L5-S1 abscess, came in with new discitis at T10-11, worsening L5-S1 abscess, and persistent L3-4 discitis. Patient discharged on cefepime, daptomycin and fluconazole. Seen by ID, placed on Micafungin and daptomycin 8 mg/kg. Per ID: continue above ABX for at least 6 more weeks with close follow up. Will have repeat MRI of spine in 3 weeks. Weekly CK and chemistries to be ordered while pt is on the ABXs. Severe Pain: Secondary to diskitis with a component of anxiety. Increased long acting MS Contin to 75 mg q 12. Short acting MSIR remains the same at 15 mg q 4. On Klonopin QHS.Pain is down today to a 4/10. Requiring less break through pain management. Back brace added for support AIDS/HIV infection: Absolute CD4 count is 40 on 01/27/14. Patient is on Truvada, Prezista and Norvir. Azithromycin for prophylaxis. Infectious Disease is on board.  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 Radiological evidence of cirrhosis and portal hypertension, with massive splenomegaly and esophageal varices. INR is 1.86. Normal LFTs. Hep C antibody is negative.  Constipation: Resolved. On bowel regimen with Senna and Miralax  Pancytopenia: Likely multifactorial from bone marrow suppression from AIDS/cirrhosis and hypersplenism. Labs show leukopenia, anemia and thrombocytopenia.  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  Was treated with chemotherapy in 2009. Bone marrow biopsy done on 11/02/2013 in the [facility], showed no evidence of recurrent disease  DVT Prophylaxis:  Lovenox 61m injection Q24  hours  Code Status: Full Family Communication: Spoke with patient Disposition Plan:SNF when medically appropriate   Consultants:  Infectious Disease  Procedures:  None  Antibiotics:  Micafungin 10/17>>  Daptomycin continued since discharge last time >>   Objective: Filed Vitals:   03/29/14 0544 03/29/14 1402 03/29/14 2329 03/30/14 0743  BP: 118/62 118/62 127/68 117/65  Pulse: 100 104 100 100  Temp: 98.4 F (36.9 C) 98.5 F (36.9 C) 99.1 F (37.3 C) 98.5 F (36.9 C)  TempSrc: Oral Oral Oral Oral  Resp: 18 20 18 17   Height:      Weight:    77.9 kg (171 lb 11.8 oz)  SpO2: 95% 95% 96% 94%    Intake/Output Summary (Last 24 hours) at 03/30/14 1029 Last data filed at 03/30/14 1003  Gross per 24 hour  Intake    580 ml  Output      0 ml  Net    580 ml   Filed Weights   03/23/14 1241 03/24/14 0057 03/30/14 0743  Weight: 74.844 kg (165 lb) 79.198 kg (174 lb 9.6 oz) 77.9 kg (171 lb 11.8 oz)    Exam: General: Alert and awake, oriented x3, not in acute distress, mild jaundice.  HEENT:   Anicteic Sclera, EOMI Neck: Supple, no JVD, no masses  Cardiovascular: RRR, S1 S2 auscultated, no rubs, murmurs or gallops.   Respiratory: Clear to auscultation bilaterally with equal chest rise  Abdomen: Soft, nontender, nondistended, + bowel sounds     Data Reviewed: Basic Metabolic Panel:  Recent Labs Lab 03/23/14 1403 03/24/14 0145 03/25/14 0514  NA 133* 135* 135*  K 4.2 4.0 4.1  CL 104 104 106  CO2 21 23 22   GLUCOSE 106* 89 95  BUN 11  9 8  CREATININE 0.43* 0.44* 0.42*  CALCIUM 8.8 8.4 8.4   Liver Function Tests:  Recent Labs Lab 03/24/14 0145 03/25/14 0514  AST 21 22  ALT 11 11  ALKPHOS 112 104  BILITOT 1.1 1.1  PROT 7.6 7.4  ALBUMIN 2.0* 2.0*   CBC:  Recent Labs Lab 03/23/14 1403 03/24/14 0145 03/25/14 0514 03/26/14 0559 03/27/14 0525  WBC 2.9* 2.2* 2.2* 2.3* 2.1*  NEUTROABS 1.7  --   --   --   --   HGB 9.7* 8.6* 8.1* 8.2* 8.6*  HCT 28.0*  25.7* 23.7* 24.0* 25.0*  MCV 96.2 99.2 96.3 94.9 97.3  PLT 115* 82* 78* 85* 87*   Cardiac Enzymes:  Recent Labs Lab 03/25/14 0514  CKTOTAL 21    Recent Results (from the past 240 hour(s))  SURGICAL PCR SCREEN     Status: None   Collection Time    03/24/14  1:51 AM      Result Value Ref Range Status   MRSA, PCR NEGATIVE  NEGATIVE Final   Staphylococcus aureus NEGATIVE  NEGATIVE Final   Comment:            The Xpert SA Assay (FDA     approved for NASAL specimens     in patients over 58 years of age),     is one component of     a comprehensive surveillance     program.  Test performance has     been validated by [employer] for patients greater     than or equal to 70 year old.     It is not intended     to diagnose infection nor to     guide or monitor treatment.     Studies: No results found.  Scheduled Meds: . atovaquone  750 mg Oral Daily  . azithromycin  1,200 mg Oral Q Sat  . buPROPion  300 mg Oral Daily  . clonazePAM  1 mg Oral QHS  . DAPTOmycin (CUBICIN)  IV  630 mg Intravenous Q24H  . Darunavir Ethanolate  800 mg Oral Q breakfast  . emtricitabine-tenofovir  1 tablet Oral QHS  . enoxaparin (LOVENOX) injection  40 mg Subcutaneous Q24H  . feeding supplement (ENSURE COMPLETE)  237 mL Oral BID BM  . gabapentin  400 mg Oral TID  . micafungin [facility]) IV  150 mg Intravenous Daily  . morphine  75 mg Oral Q12H  . polyethylene glycol  17 g Oral Daily  . ritonavir  100 mg Oral Q breakfast  . senna-docusate  2 tablet Oral Daily  . sucralfate  1 g Oral TID WC   Continuous Infusions: . sodium chloride 10 mL/hr at 03/29/14 1829    Principal Problem:   Diskitis Active Problems:   AIDS   Cirrhosis, non-alcoholic   Pancytopenia   Protein-calorie malnutrition, severe   Abscess in epidural space of lumbar spine   Infectious discitis  Colbert Ewing PA-S [facility] Pager [phone]. If 7PM-7AM, please contact night-coverage at [email or web address], password  [facility] 03/30/2014, 10:29 AM  LOS: 7 days   Attending Patient was seen, examined,treatment plan was discussed with the  Advance Practice Provider.  I have directly reviewed the clinical findings, lab, imaging studies and management of this patient in detail. I have made the necessary changes to the above noted documentation, and agree with the documentation, as recorded by the Advance Practice Provider.   Back pain better-requiring less breakthrough. On Micafungin/Cubicin-awaiting SNF placement. Patient  very concerned about going to a "good facility", I have asked him to talk to social work as I have no control over which facilities will offer him a bed.   Nena Alexander MD [facility].

## 2014-03-30 NOTE — Clinical Social Work Note (Signed)
Patient and HPOA Samuel Schultz updated on status of disposition options.   Samuel Schultz MSW, [place], [place], [phone]

## 2014-03-30 NOTE — Progress Notes (Signed)
NUTRITION FOLLOW UP  Pt meets criteria for severe MALNUTRITION in the context of chronic illness as evidenced by PO intake <75% for > one month, 18% body weight loss in 6 month, moderate muscle wasting and subcutaneous fat loss .  Intervention:   -Continue with Ensure Complete po BID, each supplement provides 350 kcal and 13 grams of protein  Nutrition Dx:   Unintentional wt loss related to chronic disease as evidenced by >30 lbs weight loss in 6 months.   Goal:   Pt to meet >/= 90% of their estimated nutrition needs  Monitor:   Total protein/energy intake, labs, weights  Assessment:   38 year old male who has a past medical history of HIV disease; Cirrhosis; PTSD (post-traumatic stress disorder); Depressive disorder; Cancer; Lymphoma; and Anemia. 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 At that time he had MRI done for the back pain which was negative but then which progress to discitis and vertebral osteomyelitis. At that time patient was not found to be a good operative candidate and was discharged to a nursing center with 8 weeks of Cubicin and cefepime, with fluconazole. Patient has been getting IV cefepime at this time, and currently residing at the [place] [facility].  Today patient came to the hospital with chief complaint of left-sided flank pain  Intake has improved since last visit; PO: 85-100%. He is consuming Ensure Complete supplements, although noted some refusals. Will continue with supplements due to malnutrition dx, as well as increased needs for HIV. Noted 3# wt loss since admission.  Discharge disposition is for SNF once bed available. CSW and RNCM following and assisting with placement.  Labs reviewed. Na:135, Creat: 0.42  Height: Ht Readings from Last 1 Encounters:  03/24/14 5\' 8"  (1.727 m)    Weight Status:   Wt Readings from Last 1  Encounters:  03/30/14 171 lb 11.8 oz (77.9 kg)  03/24/14  174 lb 9.6 oz (79.198 kg)   Re-estimated needs:  Kcal: 2100-2300 Protein: 101-11 grams Fluid: 2.1-2.3 L  Skin: Intact  Diet Order: General   Intake/Output Summary (Last 24 hours) at 03/30/14 1009 Last data filed at 03/30/14 1003  Gross per 24 hour  Intake    680 ml  Output    400 ml  Net    280 ml    Last BM: 03/29/14   Labs:   Recent Labs Lab 03/23/14 1403 03/24/14 0145 03/25/14 0514  NA 133* 135* 135*  K 4.2 4.0 4.1  CL 104 104 106  CO2 21 23 22   BUN 11 9 8   CREATININE 0.43* 0.44* 0.42*  CALCIUM 8.8 8.4 8.4  GLUCOSE 106* 89 95    CBG (last 3)  No results found for this basename: GLUCAP,  in the last 72 hours  Scheduled Meds: . atovaquone  750 mg Oral Daily  . azithromycin  1,200 mg Oral Q Sat  . buPROPion  300 mg Oral Daily  . clonazePAM  1 mg Oral QHS  . DAPTOmycin (CUBICIN)  IV  630 mg Intravenous Q24H  . Darunavir Ethanolate  800 mg Oral Q breakfast  . emtricitabine-tenofovir  1 tablet Oral QHS  . enoxaparin (LOVENOX) injection  40 mg Subcutaneous Q24H  . feeding supplement (ENSURE COMPLETE)  237 mL Oral BID BM  . gabapentin  400 mg Oral TID  . micafungin [facility]) IV  150 mg Intravenous Daily  . morphine  75 mg Oral Q12H  . polyethylene glycol  17 g Oral Daily  . ritonavir  100 mg Oral Q breakfast  . senna-docusate  2 tablet Oral Daily  . sucralfate  1 g Oral TID WC    Continuous Infusions: . sodium chloride 10 mL/hr at 03/29/14 2574    Phillis Thackeray A. Jimmye Norman, RD, LDN Pager: [phone] After hours Pager: [phone]

## 2014-03-31 MED ORDER — MORPHINE SULFATE 15 MG PO TABS
30.0000 mg | ORAL_TABLET | ORAL | Status: DC | PRN
  Administered 2014-03-31 – 2014-04-11 (×29): 30 mg via ORAL
  Filled 2014-03-31 (×32): qty 2

## 2014-03-31 NOTE — Progress Notes (Signed)
PATIENT DETAILS Name: Samuel Schultz Age: 38 y.o. Sex: male Date of Birth: [birth year] Admit Date: 03/23/2014 Admitting Physician Oswald Hillock, MD VCB:SWHQ, PAMELA, MD  Subjective: Still with some back pain-but feels that increased MS Contin has helped, asks for increase in MSIR.  Assessment/Plan: Principal Problem: Acute discitis:Recent h/o L3-4 discitis and L5-S1 abscess, came in with new discitis at T10-11, worsening L5-S1 abscess, and persistent L3-4 discitis. Patient discharged on cefepime, daptomycin and fluconazole. Seen by ID, placed on Micafungin and daptomycin 8 mg/kg. Per ID: continue above ABX for at least 6 more weeks with close follow up. Will have repeat MRI of spine in 3 weeks. Weekly CK and chemistries to be ordered while pt is on the ABXs.  Severe Pain: Secondary to diskitis with a component of anxiety. Continue with long acting MS Contin to 75 mg q 12. Increase short acting MSIR to 30 mg. On Klonopin QHS.Pain is overall better. Requiring less break through pain management. Back brace added for support  AIDS/HIV infection: Absolute CD4 count is 40 on 01/27/14. Patient is on Truvada, Prezista and Norvir. Azithromycin for prophylaxis. Infectious Disease is on board.  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 Radiological evidence of cirrhosis and portal hypertension, with massive splenomegaly and esophageal varices. INR is 1.86. Normal LFTs. Hep C antibody is negative.  Constipation: Resolved. On bowel regimen with Senna and Miralax  Pancytopenia: Likely multifactorial from bone marrow suppression from AIDS/cirrhosis and hypersplenism. Labs show stable leukopenia, anemia and thrombocytopenia.  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 Was treated with chemotherapy in 2009. Bone marrow biopsy done on  11/02/2013 in the [facility], showed no evidence of recurrent disease   Disposition: Remain inpatient-awaiting SNF bed  Antibiotics: Micafungin 10/17>>  Daptomycin continued since discharge last time >>    DVT Prophylaxis: Prophylactic Lovenox  Code Status: Full code   Family Communication None  Procedures:  None  CONSULTS:  ID  Time spent 40 minutes-which includes 50% of the time with face-to-face with patient/ family and coordinating care related to the above assessment and plan.    MEDICATIONS: Scheduled Meds: . atovaquone  750 mg Oral Daily  . azithromycin  1,200 mg Oral Q Sat  . buPROPion  300 mg Oral Daily  . clonazePAM  1 mg Oral QHS  . DAPTOmycin (CUBICIN)  IV  630 mg Intravenous Q24H  . Darunavir Ethanolate  800 mg Oral Q breakfast  . emtricitabine-tenofovir  1 tablet Oral QHS  . enoxaparin (LOVENOX) injection  40 mg Subcutaneous Q24H  . feeding supplement (ENSURE COMPLETE)  237 mL Oral BID BM  . gabapentin  400 mg Oral TID  . micafungin [facility]) IV  150 mg Intravenous Daily  . morphine  75 mg Oral Q12H  . polyethylene glycol  17 g Oral Daily  . ritonavir  100 mg Oral Q breakfast  . senna-docusate  2 tablet Oral Daily  . sucralfate  1 g Oral TID WC   Continuous Infusions: . sodium chloride 10 mL/hr at 03/29/14 0633   PRN Meds:.bisacodyl, methocarbamol, morphine, ondansetron (ZOFRAN) IV, ondansetron, sodium chloride, temazepam  Antibiotics: Anti-infectives   Start     Dose/Rate Route Frequency Ordered Stop   03/29/14 0000  DAPTOmycin 630 mg in sodium chloride 0.9 % 100 mL     630 mg 225.2 mL/hr over 30 Minutes Intravenous Every  24 hours 03/29/14 1056     03/29/14 0000  micafungin 150 mg in sodium chloride 0.9 % 100 mL     150 mg 100 mL/hr over 1 Hours Intravenous Daily 03/29/14 1056     03/25/14 0800  DAPTOmycin (CUBICIN) 630 mg in sodium chloride 0.9 % IVPB    Comments:  ~8 mg/kg   630 mg 225.2 mL/hr over 30 Minutes Intravenous Every 24 hours  03/24/14 1337     03/24/14 1400  micafungin (MYCAMINE) 150 mg in sodium chloride 0.9 % 100 mL IVPB     150 mg 100 mL/hr over 1 Hours Intravenous Daily 03/24/14 1326     03/24/14 1000  azithromycin (ZITHROMAX) tablet 1,200 mg     1,200 mg Oral Every Sat 03/24/14 0113     03/24/14 1000  ceFEPIme (MAXIPIME) 2 g in dextrose 5 % 50 mL IVPB  Status:  Discontinued     2 g 100 mL/hr over 30 Minutes Intravenous Every 12 hours 03/24/14 0113 03/24/14 1326   03/24/14 1000  fluconazole (DIFLUCAN) tablet 400 mg  Status:  Discontinued     400 mg Oral Daily 03/24/14 0113 03/24/14 1326   03/24/14 0800  Darunavir Ethanolate (PREZISTA) tablet 800 mg     800 mg Oral Daily with breakfast 03/24/14 0113     03/24/14 0800  ritonavir (NORVIR) capsule 100 mg     100 mg Oral Daily with breakfast 03/24/14 0113     03/24/14 0145  DAPTOmycin (CUBICIN) 630 mg in sodium chloride 0.9 % IVPB    Comments:  ~8 mg/kg   630 mg 225.2 mL/hr over 30 Minutes Intravenous  Once 03/24/14 0140 03/24/14 0344   03/24/14 0112  atovaquone (MEPRON) 750 MG/5ML suspension 750 mg     750 mg Oral Daily 03/24/14 0113     03/24/14 0112  emtricitabine-tenofovir (TRUVADA) 200-300 MG per tablet 1 tablet     1 tablet Oral Daily at bedtime 03/24/14 0113         PHYSICAL EXAM: Vital signs in last 24 hours: Filed Vitals:   03/30/14 1423 03/30/14 2301 03/31/14 0633 03/31/14 1453  BP: 99/59 113/58 127/84 105/73  Pulse: 98 99 106 108  Temp: 99.4 F (37.4 C) 98.3 F (36.8 C) 100.1 F (37.8 C) 98.3 F (36.8 C)  TempSrc: Oral Oral Oral Oral  Resp: _0 Height:      Weight:      SpO2: 96% 96% 94% 95%    Weight change:  Filed Weights   03/23/14 1241 03/24/14 0057 03/30/14 0743  Weight: 74.844 kg (165 lb) 79.198 kg (174 lb 9.6 oz) 77.9 kg (171 lb 11.8 oz)   Body mass index is 26.12 kg/(m^2).   Gen Exam: Awake and alert with clear speech.   Neck: Supple, No JVD.   Chest: B/L Clear.   CVS: S1 S2 Regular, no murmurs.  Abdomen:  soft, BS +, non tender, non distended.  Extremities: no edema, lower extremities warm to touch. Neurologic: Non Focal.  Skin: No Rash.   Wounds: N/A.    Intake/Output from previous day:  Intake/Output Summary (Last 24 hours) at 03/31/14 1713 Last data filed at 03/31/14 1006  Gross per 24 hour  Intake    477 ml  Output    650 ml  Net   -173 ml     LAB RESULTS: CBC  Recent Labs Lab 03/25/14 0514 03/26/14 0559 03/27/14 0525 03/30/14 1230  WBC 2.2* 2.3* 2.1* 2.2*  HGB 8.1*  8.2* 8.6* 8.5*  HCT 23.7* 24.0* 25.0* 24.8*  PLT 78* 85* 87* 91*  MCV 96.3 94.9 97.3 94.7  MCH 32.9 32.4 33.5 32.4  MCHC 34.2 34.2 34.4 34.3  RDW 16.0* 15.9* 15.7* 15.8*  LYMPHSABS  --   --   --  0.7  MONOABS  --   --   --  0.3  EOSABS  --   --   --  0.1  BASOSABS  --   --   --  0.0    Chemistries   Recent Labs Lab 03/25/14 0514 03/30/14 1230  NA 135* 135*  K 4.1 3.7  CL 106 102  CO2 22 24  GLUCOSE 95 103*  BUN 8 8  CREATININE 0.42* 0.44*  CALCIUM 8.4 9.0    CBG: No results found for this basename: GLUCAP,  in the last 168 hours  GFR Estimated Creatinine Clearance: 121.1 ml/min (by C-G formula based on Cr of 0.44).  Coagulation profile  Recent Labs Lab 03/25/14 0514  INR 1.46    Cardiac Enzymes No results found for this basename: CK, CKMB, TROPONINI, MYOGLOBIN,  in the last 168 hours  No components found with this basename: POCBNP,  No results found for this basename: DDIMER,  in the last 72 hours No results found for this basename: HGBA1C,  in the last 72 hours No results found for this basename: CHOL, HDL, LDLCALC, TRIG, CHOLHDL, LDLDIRECT,  in the last 72 hours No results found for this basename: TSH, T4TOTAL, FREET3, T3FREE, THYROIDAB,  in the last 72 hours No results found for this basename: VITAMINB12, FOLATE, FERRITIN, TIBC, IRON, RETICCTPCT,  in the last 72 hours No results found for this basename: LIPASE, AMYLASE,  in the last 72 hours  Urine Studies No results  found for this basename: UACOL, UAPR, USPG, UPH, UTP, UGL, UKET, UBIL, UHGB, UNIT, UROB, ULEU, UEPI, UWBC, URBC, UBAC, CAST, CRYS, UCOM, BILUA,  in the last 72 hours  MICROBIOLOGY: Recent Results (from the past 240 hour(s))  SURGICAL PCR SCREEN     Status: None   Collection Time    03/24/14  1:51 AM      Result Value Ref Range Status   MRSA, PCR NEGATIVE  NEGATIVE Final   Staphylococcus aureus NEGATIVE  NEGATIVE Final   Comment:            The Xpert SA Assay (FDA     approved for NASAL specimens     in patients over 48 years of age),     is one component of     a comprehensive surveillance     program.  Test performance has     been validated by [employer] for patients greater     than or equal to 2 year old.     It is not intended     to diagnose infection nor to     guide or monitor treatment.    RADIOLOGY STUDIES/RESULTS: Dg Chest 2 View  03/14/2014   ADDENDUM REPORT: 03/14/2014 16:56  ADDENDUM: I returned the patient's phone call at 16 50 on 03/14/2014. As I discussed with him, mild compression of the T11 vertebral body appears stable. There is also mild compression of the T10 vertebral body. To some degree this was present on CT scan performed 10/22/2013, but not readily evident on 01/05/2014 radiograph. I believe there has been mild interval progression in the degree of T10 compression deformity, although still mild. As we discussed, an MRI  could be considered if felt indicated to determine whether this may be a pathologic process related to metastasis. Also, we discussed the fact that no information regarding the left chest wall nodule was obtained on chest radiograph, as it was not visualized. Further characterization with CT or MRI could be considered. The patient indicated that he would confer with his referring physician.   Electronically Signed   By: Skipper Cliche M.D.   On: 03/14/2014 16:56   03/14/2014   CLINICAL DATA:  Initial evaluation for heart nodule medial  aspect of left breast or chest wall since Sunday, personal history of stage IV lymphoma and HIV  EXAM: CHEST  2 VIEW  COMPARISON:  01/05/2014  FINDINGS: Right PICC line in unchanged position.  Limited inspiratory effect.  Heart size upper normal but stable. Vascular pattern normal. Lungs clear. Bony thorax intact.  Stable mild T11 compression deformity. New mild T10 compression deformity.  IMPRESSION: No acute cardiopulmonary process. Would not necessarily expect for a soft tissue nodule to be visualized on chest radiograph. Consider further evaluation with ultrasound or CT scan for soft tissue nodule.  There appears to be new mild T10 compression deformity. This could be further evaluated with MRI of the spine. These results will be called to the ordering clinician or representative by the Radiologist Assistant, and communication documented in the PACS or zVision Dashboard.  Electronically Signed: By: Skipper Cliche M.D. On: 03/13/2014 16:46   Mr Lumbar Spine Wo Contrast  03/23/2014   CLINICAL DATA:  Multilevel infectious discitis. HIV infection. Lymphoma. Left flank pain of 2 days duration.  EXAM: MRI LUMBAR SPINE WITHOUT CONTRAST  TECHNIQUE: Multiplanar, multisequence MR imaging of the lumbar spine was performed. No intravenous contrast was administered.  COMPARISON:  CT scan same day.  Previous MRI 01/26/2014  FINDINGS: The scan covers the region from lower T9 through S3.  T9-10 does not appear involved.  T10-11: Discitis osteomyelitis at this level with fluid intensity material in the disc space and marrow edema throughout the T10 and T11 vertebral bodies. Slight loss of height at the endplates. No evidence of epidural abscess. No cord compression.  T11-12: No evidence of involvement at this level.  T12-L1 L1-2:  Normal.  L2-3: New abnormality on the left with a superior endplate Schmorl's node type pattern at L3. This could be evidence of early disc space infection at this level or this could be an actual  an related Schmorl's node.  L3-4: Persistent evidence of discitis osteomyelitis at this level with edema in the disc and vertebral bodies but no evidence of progression. No spinal abscess.  L4-5:  No evidence of involvement of this disc space.  L5-S1: Worsened pattern with fluid throughout the disc space and worsened edema within the L5 and S1 vertebral bodies. There appears to be either a left posterior lateral disc herniation with caudal migration or some epidural abscess on the left behind S1. The thecal sac is not compressed. There would be potential to affect the S1 nerve roots.  IMPRESSION: Newly seen discitis osteomyelitis at T10-11. Fluid in the disc space. Early collapse of the endplates. No spinal abscess or neural compression.  Newly seen abnormality on the left at L2-3 which looks like a superior endplate Schmorl's node but could be evidence of early infection developing at the L2-3 disc level.  Persistent osteomyelitis discitis at L3-4, similar to the previous study. No abscess or neural compression.  Marked worsening of the appearance at L5-S1 with fluid throughout the disc space,  edema within the L5 and S1 vertebral bodies, an abnormal material in the ventral epidural space that could be extruded disc material or ventral epidural abscess. The central canal is not compressed but there would be potential to affect the S1 nerve roots.   Electronically Signed   By: Nelson Chimes M.D.   On: 03/23/2014 20:21   Ct Renal Stone Study  03/23/2014   CLINICAL DATA:  Left flank pain.  HIV, cirrhosis, lymphoma  EXAM: CT ABDOMEN AND PELVIS WITHOUT CONTRAST  TECHNIQUE: Multidetector CT imaging of the abdomen and pelvis was performed following the standard protocol without IV contrast.  COMPARISON:  CT 11/10/2013  FINDINGS: On today's study, neither oral nor IV contrast was administered limiting evaluation of the bowel.  Mild right lower lobe atelectasis with elevated right hemidiaphragm. Left lung base is clear.   Advanced cirrhotic liver. Prior cholecystectomy. Marked splenomegaly unchanged. Spleen measures 17 x 14 x 18 cm. Periportal adenopathy seen on the prior study difficult to evaluate on today's study due to lack of oral and IV contrast. Probably no change.  The kidneys are normal without obstruction or mass. No renal calculi. Urinary bladder normal.  Severe constipation with large amount of stool in the colon. No bowel obstruction. Previously noted thickening of the right colon not identified on today's study. This may have been infectious colitis which has resolved  No free fluid.  No pathologic adenopathy.  Progressive discitis at T10-11 with further bone loss and kyphosis. Progressive discitis and osteomyelitis at L3-4, and L5-S1 with sclerotic changes and endplate erosion. Epidural abscess not evaluated on unenhanced CT.  IMPRESSION: No renal calculi.  Advanced cirrhosis. Marked splenomegaly is unchanged. No free fluid.  Marked constipation.  Progressive discitis and osteomyelitis at T10-11, L3-4, L5-S1.   Electronically Signed   By: Franchot Gallo M.D.   On: 03/23/2014 15:34    Oren Binet, MD  [facility] Pager:[phone]  If 7PM-7AM, please contact night-coverage [email or web address] Password [email or web address] 03/31/2014, 5:13 PM   LOS: 8 days

## 2014-04-01 LAB — CK: CK TOTAL: 34 U/L (ref 7–232)

## 2014-04-01 NOTE — Progress Notes (Signed)
PATIENT DETAILS Name: Samuel Schultz Age: 38 y.o. Sex: male Date of Birth: 29-Mar-1976 Admit Date: 03/23/2014 Admitting Physician Oswald Hillock, MD VVK:PQAE, PAMELA, MD  Subjective: Back pain stable.  Assessment/Plan: Principal Problem: Acute discitis:Recent h/o L3-4 discitis and L5-S1 abscess, came in with new discitis at T10-11, worsening L5-S1 abscess, and persistent L3-4 discitis. Patient discharged on cefepime, daptomycin and fluconazole. Seen by ID, placed on Micafungin and daptomycin 8 mg/kg. Per ID: continue above ABX for at least 6 more weeks with close follow up. Will have repeat MRI of spine in 3 weeks. Weekly CK and chemistries to be ordered while pt is on the ABXs.  Severe Pain: Secondary to diskitis with a component of anxiety. Continue with long acting MS Contin to 75 mg q 12. Increase short acting MSIR to 30 mg. On Klonopin QHS.Pain is overall better. Requiring less break through pain management. Back brace added for support  AIDS/HIV infection: Absolute CD4 count is 40 on 01/27/14. Patient is on Truvada, Prezista and Norvir. Azithromycin for prophylaxis. Infectious Disease is on board.  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 Radiological evidence of cirrhosis and portal hypertension, with massive splenomegaly and esophageal varices. INR is 1.86. Normal LFTs. Hep C antibody is negative.  Constipation: On bowel regimen with Senna and Miralax. Pancytopenia: Likely multifactorial from bone marrow suppression from AIDS/cirrhosis and hypersplenism. Labs show stable leukopenia, anemia and thrombocytopenia.  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 Was treated with chemotherapy in 2009. Bone marrow biopsy done on 11/02/2013 in the [facility], showed no evidence of recurrent disease   Disposition: Remain  inpatient-awaiting SNF bed  Antibiotics: Micafungin 10/17>>  Daptomycin continued since discharge last time >>   DVT Prophylaxis: Prophylactic Lovenox  Code Status: Full code   Family Communication None  Procedures:  None  CONSULTS:  ID  MEDICATIONS: Scheduled Meds: . atovaquone  750 mg Oral Daily  . azithromycin  1,200 mg Oral Q Sat  . buPROPion  300 mg Oral Daily  . clonazePAM  1 mg Oral QHS  . DAPTOmycin (CUBICIN)  IV  630 mg Intravenous Q24H  . Darunavir Ethanolate  800 mg Oral Q breakfast  . emtricitabine-tenofovir  1 tablet Oral QHS  . enoxaparin (LOVENOX) injection  40 mg Subcutaneous Q24H  . feeding supplement (ENSURE COMPLETE)  237 mL Oral BID BM  . gabapentin  400 mg Oral TID  . micafungin [facility]) IV  150 mg Intravenous Daily  . morphine  75 mg Oral Q12H  . polyethylene glycol  17 g Oral Daily  . ritonavir  100 mg Oral Q breakfast  . senna-docusate  2 tablet Oral Daily  . sucralfate  1 g Oral TID WC   Continuous Infusions: . sodium chloride 10 mL/hr at 04/01/14 0431   PRN Meds:.bisacodyl, methocarbamol, morphine, ondansetron (ZOFRAN) IV, ondansetron, sodium chloride, temazepam  Antibiotics: Anti-infectives   Start     Dose/Rate Route Frequency Ordered Stop   03/29/14 0000  DAPTOmycin 630 mg in sodium chloride 0.9 % 100 mL     630 mg 225.2 mL/hr over 30 Minutes Intravenous Every 24 hours 03/29/14 1056     03/29/14 0000  micafungin 150 mg in sodium chloride 0.9 % 100 mL     150 mg 100 mL/hr over 1 Hours Intravenous Daily 03/29/14 1056     03/25/14 0800  DAPTOmycin (  CUBICIN) 630 mg in sodium chloride 0.9 % IVPB    Comments:  ~8 mg/kg   630 mg 225.2 mL/hr over 30 Minutes Intravenous Every 24 hours 03/24/14 1337     03/24/14 1400  micafungin (MYCAMINE) 150 mg in sodium chloride 0.9 % 100 mL IVPB     150 mg 100 mL/hr over 1 Hours Intravenous Daily 03/24/14 1326     03/24/14 1000  azithromycin (ZITHROMAX) tablet 1,200 mg     1,200 mg Oral Every Sat  03/24/14 0113     03/24/14 1000  ceFEPIme (MAXIPIME) 2 g in dextrose 5 % 50 mL IVPB  Status:  Discontinued     2 g 100 mL/hr over 30 Minutes Intravenous Every 12 hours 03/24/14 0113 03/24/14 1326   03/24/14 1000  fluconazole (DIFLUCAN) tablet 400 mg  Status:  Discontinued     400 mg Oral Daily 03/24/14 0113 03/24/14 1326   03/24/14 0800  Darunavir Ethanolate (PREZISTA) tablet 800 mg     800 mg Oral Daily with breakfast 03/24/14 0113     03/24/14 0800  ritonavir (NORVIR) capsule 100 mg     100 mg Oral Daily with breakfast 03/24/14 0113     03/24/14 0145  DAPTOmycin (CUBICIN) 630 mg in sodium chloride 0.9 % IVPB    Comments:  ~8 mg/kg   630 mg 225.2 mL/hr over 30 Minutes Intravenous  Once 03/24/14 0140 03/24/14 0344   03/24/14 0112  atovaquone (MEPRON) 750 MG/5ML suspension 750 mg     750 mg Oral Daily 03/24/14 0113     03/24/14 0112  emtricitabine-tenofovir (TRUVADA) 200-300 MG per tablet 1 tablet     1 tablet Oral Daily at bedtime 03/24/14 0113         PHYSICAL EXAM: Vital signs in last 24 hours: Filed Vitals:   03/31/14 0633 03/31/14 1453 03/31/14 2102 04/01/14 0524  BP: 127/84 105/73 106/60 117/60  Pulse: 106 108 88 95  Temp: 100.1 F (37.8 C) 98.3 F (36.8 C) 97.7 F (36.5 C) 98.3 F (36.8 C)  TempSrc: Oral Oral Oral Oral  Resp: $Remo'18 20 17 18  'PwrOj$ Height:      Weight:      SpO2: 94% 95% 98% 93%    Weight change:  Filed Weights   03/23/14 1241 03/24/14 0057 03/30/14 0743  Weight: 74.844 kg (165 lb) 79.198 kg (174 lb 9.6 oz) 77.9 kg (171 lb 11.8 oz)   Body mass index is 26.12 kg/(m^2).   Gen Exam: Awake and alert with clear speech.   Neck: Supple, No JVD.   Chest: B/L Clear.   CVS: S1 S2 Regular, no murmurs.  Abdomen: soft, BS +, non tender, non distended.  Extremities: no edema, lower extremities warm to touch. Neurologic: Non Focal.  Skin: No Rash.   Wounds: N/A.    Intake/Output from previous day:  Intake/Output Summary (Last 24 hours) at 04/01/14 1224 Last  data filed at 04/01/14 0914  Gross per 24 hour  Intake    240 ml  Output      0 ml  Net    240 ml     LAB RESULTS: CBC  Recent Labs Lab 03/26/14 0559 03/27/14 0525 03/30/14 1230  WBC 2.3* 2.1* 2.2*  HGB 8.2* 8.6* 8.5*  HCT 24.0* 25.0* 24.8*  PLT 85* 87* 91*  MCV 94.9 97.3 94.7  MCH 32.4 33.5 32.4  MCHC 34.2 34.4 34.3  RDW 15.9* 15.7* 15.8*  LYMPHSABS  --   --  0.7  MONOABS  --   --  0.3  EOSABS  --   --  0.1  BASOSABS  --   --  0.0    Chemistries   Recent Labs Lab 03/30/14 1230  NA 135*  K 3.7  CL 102  CO2 24  GLUCOSE 103*  BUN 8  CREATININE 0.44*  CALCIUM 9.0    CBG: No results found for this basename: GLUCAP,  in the last 168 hours  GFR Estimated Creatinine Clearance: 121.1 ml/min (by C-G formula based on Cr of 0.44).  Coagulation profile No results found for this basename: INR, PROTIME,  in the last 168 hours  Cardiac Enzymes No results found for this basename: CK, CKMB, TROPONINI, MYOGLOBIN,  in the last 168 hours  No components found with this basename: POCBNP,  No results found for this basename: DDIMER,  in the last 72 hours No results found for this basename: HGBA1C,  in the last 72 hours No results found for this basename: CHOL, HDL, LDLCALC, TRIG, CHOLHDL, LDLDIRECT,  in the last 72 hours No results found for this basename: TSH, T4TOTAL, FREET3, T3FREE, THYROIDAB,  in the last 72 hours No results found for this basename: VITAMINB12, FOLATE, FERRITIN, TIBC, IRON, RETICCTPCT,  in the last 72 hours No results found for this basename: LIPASE, AMYLASE,  in the last 72 hours  Urine Studies No results found for this basename: UACOL, UAPR, USPG, UPH, UTP, UGL, UKET, UBIL, UHGB, UNIT, UROB, ULEU, UEPI, UWBC, URBC, UBAC, CAST, CRYS, UCOM, BILUA,  in the last 72 hours  MICROBIOLOGY: Recent Results (from the past 240 hour(s))  SURGICAL PCR SCREEN     Status: None   Collection Time    03/24/14  1:51 AM      Result Value Ref Range Status   MRSA,  PCR NEGATIVE  NEGATIVE Final   Staphylococcus aureus NEGATIVE  NEGATIVE Final   Comment:            The Xpert SA Assay (FDA     approved for NASAL specimens     in patients over 51 years of age),     is one component of     a comprehensive surveillance     program.  Test performance has     been validated by [employer] for patients greater     than or equal to 80 year old.     It is not intended     to diagnose infection nor to     guide or monitor treatment.    RADIOLOGY STUDIES/RESULTS: Dg Chest 2 View  03/14/2014   ADDENDUM REPORT: 03/14/2014 16:56  ADDENDUM: I returned the patient's phone call at 16 50 on 03/14/2014. As I discussed with him, mild compression of the T11 vertebral body appears stable. There is also mild compression of the T10 vertebral body. To some degree this was present on CT scan performed 10/22/2013, but not readily evident on 01/05/2014 radiograph. I believe there has been mild interval progression in the degree of T10 compression deformity, although still mild. As we discussed, an MRI could be considered if felt indicated to determine whether this may be a pathologic process related to metastasis. Also, we discussed the fact that no information regarding the left chest wall nodule was obtained on chest radiograph, as it was not visualized. Further characterization with CT or MRI could be considered. The patient indicated that he would confer with his referring physician.   Electronically Signed   By: Elodia Florence.D.  On: 03/14/2014 16:56   03/14/2014   CLINICAL DATA:  Initial evaluation for heart nodule medial aspect of left breast or chest wall since Sunday, personal history of stage IV lymphoma and HIV  EXAM: CHEST  2 VIEW  COMPARISON:  01/05/2014  FINDINGS: Right PICC line in unchanged position.  Limited inspiratory effect.  Heart size upper normal but stable. Vascular pattern normal. Lungs clear. Bony thorax intact.  Stable mild T11 compression deformity.  New mild T10 compression deformity.  IMPRESSION: No acute cardiopulmonary process. Would not necessarily expect for a soft tissue nodule to be visualized on chest radiograph. Consider further evaluation with ultrasound or CT scan for soft tissue nodule.  There appears to be new mild T10 compression deformity. This could be further evaluated with MRI of the spine. These results will be called to the ordering clinician or representative by the Radiologist Assistant, and communication documented in the PACS or zVision Dashboard.  Electronically Signed: By: Skipper Cliche M.D. On: 03/13/2014 16:46   Mr Lumbar Spine Wo Contrast  03/23/2014   CLINICAL DATA:  Multilevel infectious discitis. HIV infection. Lymphoma. Left flank pain of 2 days duration.  EXAM: MRI LUMBAR SPINE WITHOUT CONTRAST  TECHNIQUE: Multiplanar, multisequence MR imaging of the lumbar spine was performed. No intravenous contrast was administered.  COMPARISON:  CT scan same day.  Previous MRI 01/26/2014  FINDINGS: The scan covers the region from lower T9 through S3.  T9-10 does not appear involved.  T10-11: Discitis osteomyelitis at this level with fluid intensity material in the disc space and marrow edema throughout the T10 and T11 vertebral bodies. Slight loss of height at the endplates. No evidence of epidural abscess. No cord compression.  T11-12: No evidence of involvement at this level.  T12-L1 L1-2:  Normal.  L2-3: New abnormality on the left with a superior endplate Schmorl's node type pattern at L3. This could be evidence of early disc space infection at this level or this could be an actual an related Schmorl's node.  L3-4: Persistent evidence of discitis osteomyelitis at this level with edema in the disc and vertebral bodies but no evidence of progression. No spinal abscess.  L4-5:  No evidence of involvement of this disc space.  L5-S1: Worsened pattern with fluid throughout the disc space and worsened edema within the L5 and S1 vertebral  bodies. There appears to be either a left posterior lateral disc herniation with caudal migration or some epidural abscess on the left behind S1. The thecal sac is not compressed. There would be potential to affect the S1 nerve roots.  IMPRESSION: Newly seen discitis osteomyelitis at T10-11. Fluid in the disc space. Early collapse of the endplates. No spinal abscess or neural compression.  Newly seen abnormality on the left at L2-3 which looks like a superior endplate Schmorl's node but could be evidence of early infection developing at the L2-3 disc level.  Persistent osteomyelitis discitis at L3-4, similar to the previous study. No abscess or neural compression.  Marked worsening of the appearance at L5-S1 with fluid throughout the disc space, edema within the L5 and S1 vertebral bodies, an abnormal material in the ventral epidural space that could be extruded disc material or ventral epidural abscess. The central canal is not compressed but there would be potential to affect the S1 nerve roots.   Electronically Signed   By: Nelson Chimes M.D.   On: 03/23/2014 20:21   Ct Renal Stone Study  03/23/2014   CLINICAL DATA:  Left flank pain.  HIV, cirrhosis, lymphoma  EXAM: CT ABDOMEN AND PELVIS WITHOUT CONTRAST  TECHNIQUE: Multidetector CT imaging of the abdomen and pelvis was performed following the standard protocol without IV contrast.  COMPARISON:  CT 11/10/2013  FINDINGS: On today's study, neither oral nor IV contrast was administered limiting evaluation of the bowel.  Mild right lower lobe atelectasis with elevated right hemidiaphragm. Left lung base is clear.  Advanced cirrhotic liver. Prior cholecystectomy. Marked splenomegaly unchanged. Spleen measures 17 x 14 x 18 cm. Periportal adenopathy seen on the prior study difficult to evaluate on today's study due to lack of oral and IV contrast. Probably no change.  The kidneys are normal without obstruction or mass. No renal calculi. Urinary bladder normal.   Severe constipation with large amount of stool in the colon. No bowel obstruction. Previously noted thickening of the right colon not identified on today's study. This may have been infectious colitis which has resolved  No free fluid.  No pathologic adenopathy.  Progressive discitis at T10-11 with further bone loss and kyphosis. Progressive discitis and osteomyelitis at L3-4, and L5-S1 with sclerotic changes and endplate erosion. Epidural abscess not evaluated on unenhanced CT.  IMPRESSION: No renal calculi.  Advanced cirrhosis. Marked splenomegaly is unchanged. No free fluid.  Marked constipation.  Progressive discitis and osteomyelitis at T10-11, L3-4, L5-S1.   Electronically Signed   By: Franchot Gallo M.D.   On: 03/23/2014 15:34    Oren Binet, MD  [facility] Pager:[phone]  If 7PM-7AM, please contact night-coverage [email or web address] Password [email or web address] 04/01/2014, 12:24 PM   LOS: 9 days

## 2014-04-02 ENCOUNTER — Telehealth: Payer: Self-pay | Admitting: *Deleted

## 2014-04-02 ENCOUNTER — Ambulatory Visit: Payer: Medicaid Other | Admitting: Infectious Diseases

## 2014-04-02 MED ORDER — FLEET ENEMA 7-19 GM/118ML RE ENEM
1.0000 | ENEMA | Freq: Every day | RECTAL | Status: DC | PRN
  Filled 2014-04-02: qty 1

## 2014-04-02 NOTE — Clinical Social Work Note (Signed)
CSW continuing to follow for DC needs. CSW will DC to [facility] once a bed becomes available.   Liz Beach MSW, [place], [place], [phone]

## 2014-04-02 NOTE — Telephone Encounter (Signed)
Attempted to call patient to reschedule his appointment, he no showed to day. [facility] is listed as his home number. I was told patient has been discharged. Myrtis Hopping

## 2014-04-02 NOTE — Progress Notes (Signed)
PATIENT DETAILS Name: Samuel Schultz Age: 38 y.o. Sex: male Date of Birth: September 09, 1975 Admit Date: 03/23/2014 Admitting Physician Oswald Hillock, MD HQI:ONGE, PAMELA, MD  Subjective: No major issues overnight.Back pain stable.  Assessment/Plan: Principal Problem: Acute discitis:Recent h/o L3-4 discitis and L5-S1 abscess, came in with new discitis at T10-11, worsening L5-S1 abscess, and persistent L3-4 discitis. Patient discharged on cefepime, daptomycin and fluconazole. Seen by ID, placed on Micafungin and daptomycin 8 mg/kg. Per ID: continue above ABX for at least 6 more weeks with close follow up. Will have repeat MRI of spine in 3 weeks. Weekly CK and chemistries to be ordered while pt is on the ABXs.  Back Pain: Secondary to diskitis with a component of anxiety. Continue with long acting MS Contin to 75 mg q 12 and short acting MSIR to 30 mg. On Klonopin QHS.Pain is overall better. Requiring less break through pain management. Back brace added for support  AIDS/HIV infection: Absolute CD4 count is 40 on 01/27/14. Patient is on Truvada, Prezista and Norvir. Azithromycin for prophylaxis. Infectious Disease is on board.  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 Radiological evidence of cirrhosis and portal hypertension, with massive splenomegaly and esophageal varices. INR is 1.86. Normal LFTs. Hep C antibody is negative.  Constipation: On bowel regimen with Senna and Miralax. Pancytopenia: Likely multifactorial from bone marrow suppression from AIDS/cirrhosis and hypersplenism. Labs show stable leukopenia, anemia and thrombocytopenia.  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 Was treated with chemotherapy in 2009. Bone marrow biopsy done on 11/02/2013 in the [facility], showed no evidence of recurrent disease    Disposition: Remain inpatient-awaiting SNF bed  Antibiotics: Micafungin 10/17>>  Daptomycin continued since discharge last time >>   DVT Prophylaxis: Prophylactic Lovenox  Code Status: Full code   Family Communication None  Procedures:  None  CONSULTS:  ID  MEDICATIONS: Scheduled Meds: . atovaquone  750 mg Oral Daily  . azithromycin  1,200 mg Oral Q Sat  . buPROPion  300 mg Oral Daily  . clonazePAM  1 mg Oral QHS  . DAPTOmycin (CUBICIN)  IV  630 mg Intravenous Q24H  . Darunavir Ethanolate  800 mg Oral Q breakfast  . emtricitabine-tenofovir  1 tablet Oral QHS  . enoxaparin (LOVENOX) injection  40 mg Subcutaneous Q24H  . feeding supplement (ENSURE COMPLETE)  237 mL Oral BID BM  . gabapentin  400 mg Oral TID  . micafungin [facility]) IV  150 mg Intravenous Daily  . morphine  75 mg Oral Q12H  . polyethylene glycol  17 g Oral Daily  . ritonavir  100 mg Oral Q breakfast  . senna-docusate  2 tablet Oral Daily  . sucralfate  1 g Oral TID WC   Continuous Infusions: . sodium chloride 10 mL/hr at 04/01/14 0431   PRN Meds:.bisacodyl, methocarbamol, morphine, ondansetron (ZOFRAN) IV, ondansetron, sodium chloride, temazepam  Antibiotics: Anti-infectives   Start     Dose/Rate Route Frequency Ordered Stop   03/29/14 0000  DAPTOmycin 630 mg in sodium chloride 0.9 % 100 mL     630 mg 225.2 mL/hr over 30 Minutes Intravenous Every 24 hours 03/29/14 1056     03/29/14 0000  micafungin 150 mg in sodium chloride 0.9 % 100 mL     150 mg 100 mL/hr over 1 Hours Intravenous Daily 03/29/14 1056     03/25/14  0800  DAPTOmycin (CUBICIN) 630 mg in sodium chloride 0.9 % IVPB    Comments:  ~8 mg/kg   630 mg 225.2 mL/hr over 30 Minutes Intravenous Every 24 hours 03/24/14 1337     03/24/14 1400  micafungin (MYCAMINE) 150 mg in sodium chloride 0.9 % 100 mL IVPB     150 mg 100 mL/hr over 1 Hours Intravenous Daily 03/24/14 1326     03/24/14 1000  azithromycin (ZITHROMAX) tablet 1,200 mg      1,200 mg Oral Every Sat 03/24/14 0113     03/24/14 1000  ceFEPIme (MAXIPIME) 2 g in dextrose 5 % 50 mL IVPB  Status:  Discontinued     2 g 100 mL/hr over 30 Minutes Intravenous Every 12 hours 03/24/14 0113 03/24/14 1326   03/24/14 1000  fluconazole (DIFLUCAN) tablet 400 mg  Status:  Discontinued     400 mg Oral Daily 03/24/14 0113 03/24/14 1326   03/24/14 0800  Darunavir Ethanolate (PREZISTA) tablet 800 mg     800 mg Oral Daily with breakfast 03/24/14 0113     03/24/14 0800  ritonavir (NORVIR) capsule 100 mg     100 mg Oral Daily with breakfast 03/24/14 0113     03/24/14 0145  DAPTOmycin (CUBICIN) 630 mg in sodium chloride 0.9 % IVPB    Comments:  ~8 mg/kg   630 mg 225.2 mL/hr over 30 Minutes Intravenous  Once 03/24/14 0140 03/24/14 0344   03/24/14 0112  atovaquone (MEPRON) 750 MG/5ML suspension 750 mg     750 mg Oral Daily 03/24/14 0113     03/24/14 0112  emtricitabine-tenofovir (TRUVADA) 200-300 MG per tablet 1 tablet     1 tablet Oral Daily at bedtime 03/24/14 0113         PHYSICAL EXAM: Vital signs in last 24 hours: Filed Vitals:   04/01/14 0524 04/01/14 1300 04/01/14 2127 04/02/14 0513  BP: 117/60 109/63 113/51 113/56  Pulse: 95 98 98 98  Temp: 98.3 F (36.8 C) 98.8 F (37.1 C) 98.7 F (37.1 C) 99.1 F (37.3 C)  TempSrc: Oral Oral Oral Oral  Resp: 18 20 20 20   Height:      Weight:      SpO2: 93% 96% 97% 96%    Weight change:  Filed Weights   03/23/14 1241 03/24/14 0057 03/30/14 0743  Weight: 74.844 kg (165 lb) 79.198 kg (174 lb 9.6 oz) 77.9 kg (171 lb 11.8 oz)   Body mass index is 26.12 kg/(m^2).   Gen Exam: Awake and alert with clear speech.   Neck: Supple, No JVD.   Chest: B/L Clear.  No rales or rhonchi CVS: S1 S2 Regular, no murmurs.  Abdomen: soft, BS +, non tender, non distended.  Extremities: no edema, lower extremities warm to touch. Neurologic: Non Focal. Good strength bilateral lower ext. Skin: No Rash.   Wounds: N/A.    Intake/Output from  previous day:  Intake/Output Summary (Last 24 hours) at 04/02/14 1220 Last data filed at 04/02/14 1008  Gross per 24 hour  Intake   1731 ml  Output    300 ml  Net   1431 ml     LAB RESULTS: CBC  Recent Labs Lab 03/27/14 0525 03/30/14 1230  WBC 2.1* 2.2*  HGB 8.6* 8.5*  HCT 25.0* 24.8*  PLT 87* 91*  MCV 97.3 94.7  MCH 33.5 32.4  MCHC 34.4 34.3  RDW 15.7* 15.8*  LYMPHSABS  --  0.7  MONOABS  --  0.3  EOSABS  --  0.1  BASOSABS  --  0.0    Chemistries   Recent Labs Lab 03/30/14 1230  NA 135*  K 3.7  CL 102  CO2 24  GLUCOSE 103*  BUN 8  CREATININE 0.44*  CALCIUM 9.0    CBG: No results found for this basename: GLUCAP,  in the last 168 hours  GFR Estimated Creatinine Clearance: 121.1 ml/min (by C-G formula based on Cr of 0.44).  Coagulation profile No results found for this basename: INR, PROTIME,  in the last 168 hours  Cardiac Enzymes No results found for this basename: CK, CKMB, TROPONINI, MYOGLOBIN,  in the last 168 hours  No components found with this basename: POCBNP,  No results found for this basename: DDIMER,  in the last 72 hours No results found for this basename: HGBA1C,  in the last 72 hours No results found for this basename: CHOL, HDL, LDLCALC, TRIG, CHOLHDL, LDLDIRECT,  in the last 72 hours No results found for this basename: TSH, T4TOTAL, FREET3, T3FREE, THYROIDAB,  in the last 72 hours No results found for this basename: VITAMINB12, FOLATE, FERRITIN, TIBC, IRON, RETICCTPCT,  in the last 72 hours No results found for this basename: LIPASE, AMYLASE,  in the last 72 hours  Urine Studies No results found for this basename: UACOL, UAPR, USPG, UPH, UTP, UGL, UKET, UBIL, UHGB, UNIT, UROB, ULEU, UEPI, UWBC, URBC, UBAC, CAST, CRYS, UCOM, BILUA,  in the last 72 hours  MICROBIOLOGY: Recent Results (from the past 240 hour(s))  SURGICAL PCR SCREEN     Status: None   Collection Time    03/24/14  1:51 AM      Result Value Ref Range Status   MRSA,  PCR NEGATIVE  NEGATIVE Final   Staphylococcus aureus NEGATIVE  NEGATIVE Final   Comment:            The Xpert SA Assay (FDA     approved for NASAL specimens     in patients over 70 years of age),     is one component of     a comprehensive surveillance     program.  Test performance has     been validated by [employer] for patients greater     than or equal to 27 year old.     It is not intended     to diagnose infection nor to     guide or monitor treatment.    RADIOLOGY STUDIES/RESULTS: Dg Chest 2 View  03/14/2014   ADDENDUM REPORT: 03/14/2014 16:56  ADDENDUM: I returned the patient's phone call at 16 50 on 03/14/2014. As I discussed with him, mild compression of the T11 vertebral body appears stable. There is also mild compression of the T10 vertebral body. To some degree this was present on CT scan performed 10/22/2013, but not readily evident on 01/05/2014 radiograph. I believe there has been mild interval progression in the degree of T10 compression deformity, although still mild. As we discussed, an MRI could be considered if felt indicated to determine whether this may be a pathologic process related to metastasis. Also, we discussed the fact that no information regarding the left chest wall nodule was obtained on chest radiograph, as it was not visualized. Further characterization with CT or MRI could be considered. The patient indicated that he would confer with his referring physician.   Electronically Signed   By: Skipper Cliche M.D.   On: 03/14/2014 16:56   03/14/2014   CLINICAL DATA:  Initial  evaluation for heart nodule medial aspect of left breast or chest wall since Sunday, personal history of stage IV lymphoma and HIV  EXAM: CHEST  2 VIEW  COMPARISON:  01/05/2014  FINDINGS: Right PICC line in unchanged position.  Limited inspiratory effect.  Heart size upper normal but stable. Vascular pattern normal. Lungs clear. Bony thorax intact.  Stable mild T11 compression deformity.  New mild T10 compression deformity.  IMPRESSION: No acute cardiopulmonary process. Would not necessarily expect for a soft tissue nodule to be visualized on chest radiograph. Consider further evaluation with ultrasound or CT scan for soft tissue nodule.  There appears to be new mild T10 compression deformity. This could be further evaluated with MRI of the spine. These results will be called to the ordering clinician or representative by the Radiologist Assistant, and communication documented in the PACS or zVision Dashboard.  Electronically Signed: By: Skipper Cliche M.D. On: 03/13/2014 16:46   Mr Lumbar Spine Wo Contrast  03/23/2014   CLINICAL DATA:  Multilevel infectious discitis. HIV infection. Lymphoma. Left flank pain of 2 days duration.  EXAM: MRI LUMBAR SPINE WITHOUT CONTRAST  TECHNIQUE: Multiplanar, multisequence MR imaging of the lumbar spine was performed. No intravenous contrast was administered.  COMPARISON:  CT scan same day.  Previous MRI 01/26/2014  FINDINGS: The scan covers the region from lower T9 through S3.  T9-10 does not appear involved.  T10-11: Discitis osteomyelitis at this level with fluid intensity material in the disc space and marrow edema throughout the T10 and T11 vertebral bodies. Slight loss of height at the endplates. No evidence of epidural abscess. No cord compression.  T11-12: No evidence of involvement at this level.  T12-L1 L1-2:  Normal.  L2-3: New abnormality on the left with a superior endplate Schmorl's node type pattern at L3. This could be evidence of early disc space infection at this level or this could be an actual an related Schmorl's node.  L3-4: Persistent evidence of discitis osteomyelitis at this level with edema in the disc and vertebral bodies but no evidence of progression. No spinal abscess.  L4-5:  No evidence of involvement of this disc space.  L5-S1: Worsened pattern with fluid throughout the disc space and worsened edema within the L5 and S1 vertebral  bodies. There appears to be either a left posterior lateral disc herniation with caudal migration or some epidural abscess on the left behind S1. The thecal sac is not compressed. There would be potential to affect the S1 nerve roots.  IMPRESSION: Newly seen discitis osteomyelitis at T10-11. Fluid in the disc space. Early collapse of the endplates. No spinal abscess or neural compression.  Newly seen abnormality on the left at L2-3 which looks like a superior endplate Schmorl's node but could be evidence of early infection developing at the L2-3 disc level.  Persistent osteomyelitis discitis at L3-4, similar to the previous study. No abscess or neural compression.  Marked worsening of the appearance at L5-S1 with fluid throughout the disc space, edema within the L5 and S1 vertebral bodies, an abnormal material in the ventral epidural space that could be extruded disc material or ventral epidural abscess. The central canal is not compressed but there would be potential to affect the S1 nerve roots.   Electronically Signed   By: Nelson Chimes M.D.   On: 03/23/2014 20:21   Ct Renal Stone Study  03/23/2014   CLINICAL DATA:  Left flank pain.  HIV, cirrhosis, lymphoma  EXAM: CT ABDOMEN AND PELVIS WITHOUT CONTRAST  TECHNIQUE: Multidetector CT imaging of the abdomen and pelvis was performed following the standard protocol without IV contrast.  COMPARISON:  CT 11/10/2013  FINDINGS: On today's study, neither oral nor IV contrast was administered limiting evaluation of the bowel.  Mild right lower lobe atelectasis with elevated right hemidiaphragm. Left lung base is clear.  Advanced cirrhotic liver. Prior cholecystectomy. Marked splenomegaly unchanged. Spleen measures 17 x 14 x 18 cm. Periportal adenopathy seen on the prior study difficult to evaluate on today's study due to lack of oral and IV contrast. Probably no change.  The kidneys are normal without obstruction or mass. No renal calculi. Urinary bladder normal.   Severe constipation with large amount of stool in the colon. No bowel obstruction. Previously noted thickening of the right colon not identified on today's study. This may have been infectious colitis which has resolved  No free fluid.  No pathologic adenopathy.  Progressive discitis at T10-11 with further bone loss and kyphosis. Progressive discitis and osteomyelitis at L3-4, and L5-S1 with sclerotic changes and endplate erosion. Epidural abscess not evaluated on unenhanced CT.  IMPRESSION: No renal calculi.  Advanced cirrhosis. Marked splenomegaly is unchanged. No free fluid.  Marked constipation.  Progressive discitis and osteomyelitis at T10-11, L3-4, L5-S1.   Electronically Signed   By: Franchot Gallo M.D.   On: 03/23/2014 15:34    Oren Binet, MD  [facility] Pager:[phone]  If 7PM-7AM, please contact night-coverage [email or web address] Password [email or web address] 04/02/2014, 12:20 PM   LOS: 10 days

## 2014-04-02 NOTE — Progress Notes (Signed)
PT Cancellation Note  Patient Details Name: Samuel Schultz MRN: [id number] DOB: Oct 13, 1975   Cancelled Treatment:    Reason Eval/Treat Not Completed: Patient declined, no reason specified  Pt refused to participate in therapy reporting he just spent 1 hour getting washed up etc and was too tired. Requested therapist return later in day. Informed pt therapist will return if time allows, otherwise will follow up next available day.  Candy Sledge A 04/02/2014, 1:42 PM Candy Sledge, [place], DPT [phone]

## 2014-04-03 MED ORDER — ONDANSETRON HCL 4 MG PO TABS
4.0000 mg | ORAL_TABLET | Freq: Three times a day (TID) | ORAL | Status: DC
  Administered 2014-04-03 – 2014-04-10 (×22): 4 mg via ORAL
  Filled 2014-04-03 (×26): qty 1

## 2014-04-03 MED ORDER — ONDANSETRON HCL 4 MG/2ML IJ SOLN: 4.0000 mg | Freq: Three times a day (TID) | INTRAMUSCULAR | Status: DC

## 2014-04-03 NOTE — Progress Notes (Signed)
PATIENT DETAILS Name: Samuel Schultz Age: 38 y.o. Sex: male Date of Birth: 1975-07-10 Admit Date: 03/23/2014 Admitting Physician Oswald Hillock, MD JEH:UDJS, PAMELA, MD  Subjective: Vomited last night.Back pain stable.Had BM yesterday  Assessment/Plan: Principal Problem: Acute discitis:Recent h/o L3-4 discitis and L5-S1 abscess, came in with new discitis at T10-11, worsening L5-S1 abscess, and persistent L3-4 discitis. Patient discharged on cefepime, daptomycin and fluconazole. Seen by ID, placed on Micafungin and daptomycin 8 mg/kg. Per ID: continue above ABX for at least 6 more weeks with close follow up. Will have repeat MRI of spine in 3 weeks. Weekly CK and chemistries to be ordered while pt is on the ABXs. Stable, awaiting SNF bed. Nausea with intermittent vomiting:suspect releated to narcotics, having BM's.Trial of scheduled Zofran for 1 day from 10/27. If symptoms persists, will check xray. Back Pain: Secondary to diskitis with a component of anxiety. Continue with long acting MS Contin to 75 mg q 12 and short acting MSIR to 30 mg. On Klonopin QHS.Pain is overall better. Requiring less break through pain management. Back brace added for support  AIDS/HIV infection: Absolute CD4 count is 40 on 01/27/14. Patient is on Truvada, Prezista and Norvir. Azithromycin for prophylaxis. Infectious Disease is on board.  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 Radiological evidence of cirrhosis and portal hypertension, with massive splenomegaly and esophageal varices. INR is 1.86. Normal LFTs. Hep C antibody is negative.  Constipation: On bowel regimen with Senna and Miralax. Pancytopenia: Likely multifactorial from bone marrow suppression from AIDS/cirrhosis and hypersplenism. Labs show stable leukopenia, anemia and thrombocytopenia.  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 Was treated with chemotherapy in 2009. Bone marrow biopsy done on 11/02/2013 in the [facility], showed no evidence of recurrent disease   Disposition: Remain inpatient-awaiting SNF bed  Antibiotics: Micafungin 10/17>>  Daptomycin continued since discharge last time >>   DVT Prophylaxis: Prophylactic Lovenox  Code Status: Full code   Family Communication None  Procedures:  None  CONSULTS:  ID  MEDICATIONS: Scheduled Meds: . atovaquone  750 mg Oral Daily  . azithromycin  1,200 mg Oral Q Sat  . buPROPion  300 mg Oral Daily  . clonazePAM  1 mg Oral QHS  . DAPTOmycin (CUBICIN)  IV  630 mg Intravenous Q24H  . Darunavir Ethanolate  800 mg Oral Q breakfast  . emtricitabine-tenofovir  1 tablet Oral QHS  . enoxaparin (LOVENOX) injection  40 mg Subcutaneous Q24H  . feeding supplement (ENSURE COMPLETE)  237 mL Oral BID BM  . gabapentin  400 mg Oral TID  . micafungin [facility]) IV  150 mg Intravenous Daily  . morphine  75 mg Oral Q12H  . ondansetron  4 mg Oral TID AC  . polyethylene glycol  17 g Oral Daily  . ritonavir  100 mg Oral Q breakfast  . senna-docusate  2 tablet Oral Daily  . sucralfate  1 g Oral TID WC   Continuous Infusions: . sodium chloride 10 mL/hr at 04/02/14 1301   PRN Meds:.bisacodyl, methocarbamol, morphine, sodium chloride, sodium phosphate, temazepam  Antibiotics: Anti-infectives   Start     Dose/Rate Route Frequency Ordered Stop   03/29/14 0000  DAPTOmycin 630 mg in sodium chloride 0.9 % 100 mL     630 mg 225.2 mL/hr over 30 Minutes Intravenous Every 24 hours 03/29/14 1056  03/29/14 0000  micafungin 150 mg in sodium chloride 0.9 % 100 mL     150 mg 100 mL/hr over 1 Hours Intravenous Daily 03/29/14 1056     03/25/14 0800  DAPTOmycin (CUBICIN) 630 mg in sodium chloride 0.9 % IVPB    Comments:  ~8 mg/kg   630 mg 225.2 mL/hr over 30 Minutes Intravenous Every 24 hours 03/24/14 1337      03/24/14 1400  micafungin (MYCAMINE) 150 mg in sodium chloride 0.9 % 100 mL IVPB     150 mg 100 mL/hr over 1 Hours Intravenous Daily 03/24/14 1326     03/24/14 1000  azithromycin (ZITHROMAX) tablet 1,200 mg     1,200 mg Oral Every Sat 03/24/14 0113     03/24/14 1000  ceFEPIme (MAXIPIME) 2 g in dextrose 5 % 50 mL IVPB  Status:  Discontinued     2 g 100 mL/hr over 30 Minutes Intravenous Every 12 hours 03/24/14 0113 03/24/14 1326   03/24/14 1000  fluconazole (DIFLUCAN) tablet 400 mg  Status:  Discontinued     400 mg Oral Daily 03/24/14 0113 03/24/14 1326   03/24/14 0800  Darunavir Ethanolate (PREZISTA) tablet 800 mg     800 mg Oral Daily with breakfast 03/24/14 0113     03/24/14 0800  ritonavir (NORVIR) capsule 100 mg     100 mg Oral Daily with breakfast 03/24/14 0113     03/24/14 0145  DAPTOmycin (CUBICIN) 630 mg in sodium chloride 0.9 % IVPB    Comments:  ~8 mg/kg   630 mg 225.2 mL/hr over 30 Minutes Intravenous  Once 03/24/14 0140 03/24/14 0344   03/24/14 0112  atovaquone (MEPRON) 750 MG/5ML suspension 750 mg     750 mg Oral Daily 03/24/14 0113     03/24/14 0112  emtricitabine-tenofovir (TRUVADA) 200-300 MG per tablet 1 tablet     1 tablet Oral Daily at bedtime 03/24/14 0113         PHYSICAL EXAM: Vital signs in last 24 hours: Filed Vitals:   04/02/14 0513 04/02/14 1423 04/02/14 2122 04/03/14 0542  BP: 113/56 100/48 116/59 107/62  Pulse: 98 98 94 99  Temp: 99.1 F (37.3 C) 98.6 F (37 C) 98.1 F (36.7 C) 98.5 F (36.9 C)  TempSrc: Oral Oral Oral Oral  Resp: _0 Height:      Weight:      SpO2: 96% 95% 98% 93%    Weight change:  Filed Weights   03/23/14 1241 03/24/14 0057 03/30/14 0743  Weight: 74.844 kg (165 lb) 79.198 kg (174 lb 9.6 oz) 77.9 kg (171 lb 11.8 oz)   Body mass index is 26.12 kg/(m^2).   Gen Exam: Awake and alert with clear speech.  Not in any distress Neck: Supple, No JVD.   Chest: B/L Clear.  No rales  CVS: S1 S2 Regular, no murmurs.    Abdomen: soft, BS +, non tender, non distended.  Extremities: no edema, lower extremities warm to touch. Neurologic: Non Focal. Good strength bilateral lower ext. Skin: No Rash.   Wounds: N/A.    Intake/Output from previous day:  Intake/Output Summary (Last 24 hours) at 04/03/14 1120 Last data filed at 04/03/14 0622  Gross per 24 hour  Intake 1024.33 ml  Output    200 ml  Net 824.33 ml     LAB RESULTS: CBC  Recent Labs Lab 03/30/14 1230  WBC 2.2*  HGB 8.5*  HCT 24.8*  PLT 91*  MCV 94.7  MCH 32.4  MCHC 34.3  RDW 15.8*  LYMPHSABS 0.7  MONOABS 0.3  EOSABS 0.1  BASOSABS 0.0    Chemistries   Recent Labs Lab 03/30/14 1230  NA 135*  K 3.7  CL 102  CO2 24  GLUCOSE 103*  BUN 8  CREATININE 0.44*  CALCIUM 9.0    CBG: No results found for this basename: GLUCAP,  in the last 168 hours  GFR Estimated Creatinine Clearance: 121.1 ml/min (by C-G formula based on Cr of 0.44).  Coagulation profile No results found for this basename: INR, PROTIME,  in the last 168 hours  Cardiac Enzymes No results found for this basename: CK, CKMB, TROPONINI, MYOGLOBIN,  in the last 168 hours  No components found with this basename: POCBNP,  No results found for this basename: DDIMER,  in the last 72 hours No results found for this basename: HGBA1C,  in the last 72 hours No results found for this basename: CHOL, HDL, LDLCALC, TRIG, CHOLHDL, LDLDIRECT,  in the last 72 hours No results found for this basename: TSH, T4TOTAL, FREET3, T3FREE, THYROIDAB,  in the last 72 hours No results found for this basename: VITAMINB12, FOLATE, FERRITIN, TIBC, IRON, RETICCTPCT,  in the last 72 hours No results found for this basename: LIPASE, AMYLASE,  in the last 72 hours  Urine Studies No results found for this basename: UACOL, UAPR, USPG, UPH, UTP, UGL, UKET, UBIL, UHGB, UNIT, UROB, ULEU, UEPI, UWBC, URBC, UBAC, CAST, CRYS, UCOM, BILUA,  in the last 72 hours  MICROBIOLOGY: No results found  for this or any previous visit (from the past 240 hour(s)).  RADIOLOGY STUDIES/RESULTS: Dg Chest 2 View  03/14/2014   ADDENDUM REPORT: 03/14/2014 16:56  ADDENDUM: I returned the patient's phone call at 16 50 on 03/14/2014. As I discussed with him, mild compression of the T11 vertebral body appears stable. There is also mild compression of the T10 vertebral body. To some degree this was present on CT scan performed 10/22/2013, but not readily evident on 01/05/2014 radiograph. I believe there has been mild interval progression in the degree of T10 compression deformity, although still mild. As we discussed, an MRI could be considered if felt indicated to determine whether this may be a pathologic process related to metastasis. Also, we discussed the fact that no information regarding the left chest wall nodule was obtained on chest radiograph, as it was not visualized. Further characterization with CT or MRI could be considered. The patient indicated that he would confer with his referring physician.   Electronically Signed   By: Skipper Cliche M.D.   On: 03/14/2014 16:56   03/14/2014   CLINICAL DATA:  Initial evaluation for heart nodule medial aspect of left breast or chest wall since Sunday, personal history of stage IV lymphoma and HIV  EXAM: CHEST  2 VIEW  COMPARISON:  01/05/2014  FINDINGS: Right PICC line in unchanged position.  Limited inspiratory effect.  Heart size upper normal but stable. Vascular pattern normal. Lungs clear. Bony thorax intact.  Stable mild T11 compression deformity. New mild T10 compression deformity.  IMPRESSION: No acute cardiopulmonary process. Would not necessarily expect for a soft tissue nodule to be visualized on chest radiograph. Consider further evaluation with ultrasound or CT scan for soft tissue nodule.  There appears to be new mild T10 compression deformity. This could be further evaluated with MRI of the spine. These results will be called to the ordering clinician or  representative by the Radiologist Assistant, and communication documented in the PACS or  [facility] Dashboard.  Electronically Signed: By: Skipper Cliche M.D. On: 03/13/2014 16:46   Mr Lumbar Spine Wo Contrast  03/23/2014   CLINICAL DATA:  Multilevel infectious discitis. HIV infection. Lymphoma. Left flank pain of 2 days duration.  EXAM: MRI LUMBAR SPINE WITHOUT CONTRAST  TECHNIQUE: Multiplanar, multisequence MR imaging of the lumbar spine was performed. No intravenous contrast was administered.  COMPARISON:  CT scan same day.  Previous MRI 01/26/2014  FINDINGS: The scan covers the region from lower T9 through S3.  T9-10 does not appear involved.  T10-11: Discitis osteomyelitis at this level with fluid intensity material in the disc space and marrow edema throughout the T10 and T11 vertebral bodies. Slight loss of height at the endplates. No evidence of epidural abscess. No cord compression.  T11-12: No evidence of involvement at this level.  T12-L1 L1-2:  Normal.  L2-3: New abnormality on the left with a superior endplate Schmorl's node type pattern at L3. This could be evidence of early disc space infection at this level or this could be an actual an related Schmorl's node.  L3-4: Persistent evidence of discitis osteomyelitis at this level with edema in the disc and vertebral bodies but no evidence of progression. No spinal abscess.  L4-5:  No evidence of involvement of this disc space.  L5-S1: Worsened pattern with fluid throughout the disc space and worsened edema within the L5 and S1 vertebral bodies. There appears to be either a left posterior lateral disc herniation with caudal migration or some epidural abscess on the left behind S1. The thecal sac is not compressed. There would be potential to affect the S1 nerve roots.  IMPRESSION: Newly seen discitis osteomyelitis at T10-11. Fluid in the disc space. Early collapse of the endplates. No spinal abscess or neural compression.  Newly seen abnormality on the  left at L2-3 which looks like a superior endplate Schmorl's node but could be evidence of early infection developing at the L2-3 disc level.  Persistent osteomyelitis discitis at L3-4, similar to the previous study. No abscess or neural compression.  Marked worsening of the appearance at L5-S1 with fluid throughout the disc space, edema within the L5 and S1 vertebral bodies, an abnormal material in the ventral epidural space that could be extruded disc material or ventral epidural abscess. The central canal is not compressed but there would be potential to affect the S1 nerve roots.   Electronically Signed   By: Nelson Chimes M.D.   On: 03/23/2014 20:21   Ct Renal Stone Study  03/23/2014   CLINICAL DATA:  Left flank pain.  HIV, cirrhosis, lymphoma  EXAM: CT ABDOMEN AND PELVIS WITHOUT CONTRAST  TECHNIQUE: Multidetector CT imaging of the abdomen and pelvis was performed following the standard protocol without IV contrast.  COMPARISON:  CT 11/10/2013  FINDINGS: On today's study, neither oral nor IV contrast was administered limiting evaluation of the bowel.  Mild right lower lobe atelectasis with elevated right hemidiaphragm. Left lung base is clear.  Advanced cirrhotic liver. Prior cholecystectomy. Marked splenomegaly unchanged. Spleen measures 17 x 14 x 18 cm. Periportal adenopathy seen on the prior study difficult to evaluate on today's study due to lack of oral and IV contrast. Probably no change.  The kidneys are normal without obstruction or mass. No renal calculi. Urinary bladder normal.  Severe constipation with large amount of stool in the colon. No bowel obstruction. Previously noted thickening of the right colon not identified on today's study. This may have been infectious colitis which has  resolved  No free fluid.  No pathologic adenopathy.  Progressive discitis at T10-11 with further bone loss and kyphosis. Progressive discitis and osteomyelitis at L3-4, and L5-S1 with sclerotic changes and endplate  erosion. Epidural abscess not evaluated on unenhanced CT.  IMPRESSION: No renal calculi.  Advanced cirrhosis. Marked splenomegaly is unchanged. No free fluid.  Marked constipation.  Progressive discitis and osteomyelitis at T10-11, L3-4, L5-S1.   Electronically Signed   By: Franchot Gallo M.D.   On: 03/23/2014 15:34    Oren Binet, MD  [facility] Pager:[phone]  If 7PM-7AM, please contact night-coverage [email or web address] Password [email or web address] 04/03/2014, 11:20 AM   LOS: 11 days

## 2014-04-04 NOTE — Progress Notes (Signed)
Addendum  Patient seen and examined, chart and data base reviewed.  I agree with the above assessment and plan.  For full details please see Mrs. Imogene Burn PA note.  Acute discitis, 2 levels L3-4 and T10-11.  Antibiotics change to daptomycin and micafungin, patient is improving.   Birdie Hopes, MD [facility] Pager: [phone] 04/04/2014, 3:51 PM

## 2014-04-04 NOTE — Progress Notes (Signed)
PATIENT DETAILS Name: Samuel Schultz Age: 38 y.o. Sex: male Date of Birth: Dec 01, 1975 Admit Date: 03/23/2014 Admitting Physician Oswald Hillock, MD [id number], Olin Hauser, MD  Subjective: Reports feeling better.  Able to ambulate in the room.  Assessment/Plan: Principal Problem: Acute discitis:Recent h/o L3-4 discitis and L5-S1 abscess, came in with new discitis at T10-11, worsening L5-S1 abscess, and persistent L3-4 discitis. Patient discharged on cefepime, daptomycin and fluconazole. Seen by ID, placed on Micafungin and daptomycin 8 mg/kg. Per ID: continue above ABX for at least 6 more weeks with close follow up. Will have repeat MRI of spine in 3 weeks. Weekly CK and chemistries to be ordered while pt is on the ABXs. Stable, awaiting SNF bed.  Nausea with intermittent vomiting:  suspect releated to narcotics, having BM's.Trial of scheduled Zofran for 1 day from 10/27. If symptoms persists, will check xray.  On 10/28 doing well.  Tolerating soft diet.  Back Pain: Secondary to diskitis with a component of anxiety. Continue with long acting MS Contin to 75 mg q 12 and short acting MSIR to 30 mg. On Klonopin QHS.Pain is overall better. Requiring less break through pain management. Back brace added for support   AIDS/HIV infection: Absolute CD4 count is 40 on 01/27/14. Patient is on Truvada, Prezista and Norvir. Azithromycin for prophylaxis. Infectious Disease recommendations are being followed.  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 Radiological evidence of cirrhosis and portal hypertension, with massive splenomegaly and esophageal varices. INR is 1.86. Normal LFTs. Hep C antibody is negative.   Constipation: On bowel regimen with Senna and Miralax.  Pancytopenia: Likely multifactorial from bone marrow suppression from AIDS/cirrhosis and hypersplenism. Labs show stable leukopenia, anemia and thrombocytopenia.    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 Was treated with chemotherapy in 2009. Bone marrow biopsy done on 11/02/2013 in the [facility], showed no evidence of recurrent disease   Disposition: Remain inpatient-awaiting SNF bed  Antibiotics: Micafungin 10/17>>  Daptomycin continued since discharge last time >>   DVT Prophylaxis: Prophylactic Lovenox  Code Status: Full code   Family Communication None.  Patient is alert and orientated.  Procedures:  None  CONSULTS:  ID  MEDICATIONS: Scheduled Meds: . atovaquone  750 mg Oral Daily  . azithromycin  1,200 mg Oral Q Sat  . buPROPion  300 mg Oral Daily  . clonazePAM  1 mg Oral QHS  . DAPTOmycin (CUBICIN)  IV  630 mg Intravenous Q24H  . Darunavir Ethanolate  800 mg Oral Q breakfast  . emtricitabine-tenofovir  1 tablet Oral QHS  . enoxaparin (LOVENOX) injection  40 mg Subcutaneous Q24H  . feeding supplement (ENSURE COMPLETE)  237 mL Oral BID BM  . gabapentin  400 mg Oral TID  . micafungin [facility]) IV  150 mg Intravenous Daily  . morphine  75 mg Oral Q12H  . ondansetron  4 mg Oral TID AC  . polyethylene glycol  17 g Oral Daily  . ritonavir  100 mg Oral Q breakfast  . senna-docusate  2 tablet Oral Daily  . sucralfate  1 g Oral TID WC   Continuous Infusions: . sodium chloride 10 mL/hr at 04/04/14 0909   PRN Meds:.bisacodyl, methocarbamol, morphine, sodium chloride, sodium phosphate, temazepam  Antibiotics: Anti-infectives   Start     Dose/Rate Route Frequency Ordered Stop   03/29/14 0000  DAPTOmycin 630  mg in sodium chloride 0.9 % 100 mL     630 mg 225.2 mL/hr over 30 Minutes Intravenous Every 24 hours 03/29/14 1056     03/29/14 0000  micafungin 150 mg in sodium chloride 0.9 % 100 mL     150 mg 100 mL/hr over 1 Hours Intravenous Daily 03/29/14 1056     03/25/14 0800  DAPTOmycin (CUBICIN) 630 mg in sodium chloride  0.9 % IVPB    Comments:  ~8 mg/kg   630 mg 225.2 mL/hr over 30 Minutes Intravenous Every 24 hours 03/24/14 1337     03/24/14 1400  micafungin (MYCAMINE) 150 mg in sodium chloride 0.9 % 100 mL IVPB     150 mg 100 mL/hr over 1 Hours Intravenous Daily 03/24/14 1326     03/24/14 1000  azithromycin (ZITHROMAX) tablet 1,200 mg     1,200 mg Oral Every Sat 03/24/14 0113     03/24/14 1000  ceFEPIme (MAXIPIME) 2 g in dextrose 5 % 50 mL IVPB  Status:  Discontinued     2 g 100 mL/hr over 30 Minutes Intravenous Every 12 hours 03/24/14 0113 03/24/14 1326   03/24/14 1000  fluconazole (DIFLUCAN) tablet 400 mg  Status:  Discontinued     400 mg Oral Daily 03/24/14 0113 03/24/14 1326   03/24/14 0800  Darunavir Ethanolate (PREZISTA) tablet 800 mg     800 mg Oral Daily with breakfast 03/24/14 0113     03/24/14 0800  ritonavir (NORVIR) capsule 100 mg     100 mg Oral Daily with breakfast 03/24/14 0113     03/24/14 0145  DAPTOmycin (CUBICIN) 630 mg in sodium chloride 0.9 % IVPB    Comments:  ~8 mg/kg   630 mg 225.2 mL/hr over 30 Minutes Intravenous  Once 03/24/14 0140 03/24/14 0344   03/24/14 0112  atovaquone (MEPRON) 750 MG/5ML suspension 750 mg     750 mg Oral Daily 03/24/14 0113     03/24/14 0112  emtricitabine-tenofovir (TRUVADA) 200-300 MG per tablet 1 tablet     1 tablet Oral Daily at bedtime 03/24/14 0113         PHYSICAL EXAM: Vital signs in last 24 hours: Filed Vitals:   04/03/14 2100 04/03/14 2143 04/04/14 0611 04/04/14 1337  BP:  119/63 112/57 96/48  Pulse:  100 94 95  Temp:  98.6 F (37 C) 98.4 F (36.9 C) 98.6 F (37 C)  TempSrc:  Oral Oral Oral  Resp: 16 16 17 18   Height:      Weight:      SpO2:  96% 94% 94%    Weight change:  Filed Weights   03/23/14 1241 03/24/14 0057 03/30/14 0743  Weight: 74.844 kg (165 lb) 79.198 kg (174 lb 9.6 oz) 77.9 kg (171 lb 11.8 oz)   Body mass index is 26.12 kg/(m^2).   Gen Exam: Awake and alert with clear speech.  Not in any distress.   Sitting up eating breakfast.  Appears better. Neck: Supple, No JVD.   Chest: B/L Clear.  No rales  CVS: S1 S2 Regular, no murmurs.  Abdomen: soft, BS +, non tender, non distended.  Extremities: no edema, lower extremities warm to touch. Neurologic: Non Focal. Good strength bilateral lower ext. Skin: No Rash.   Wounds: N/A.    Intake/Output from previous day:  Intake/Output Summary (Last 24 hours) at 04/04/14 1519 Last data filed at 04/04/14 1339  Gross per 24 hour  Intake   1065 ml  Output    575 ml  Net    490 ml     LAB RESULTS: CBC  Recent Labs Lab 03/30/14 1230  WBC 2.2*  HGB 8.5*  HCT 24.8*  PLT 91*  MCV 94.7  MCH 32.4  MCHC 34.3  RDW 15.8*  LYMPHSABS 0.7  MONOABS 0.3  EOSABS 0.1  BASOSABS 0.0    Chemistries   Recent Labs Lab 03/30/14 1230  NA 135*  K 3.7  CL 102  CO2 24  GLUCOSE 103*  BUN 8  CREATININE 0.44*  CALCIUM 9.0     RADIOLOGY STUDIES/RESULTS: Dg Chest 2 View  03/14/2014   ADDENDUM REPORT: 03/14/2014 16:56  ADDENDUM: I returned the patient's phone call at 16 50 on 03/14/2014. As I discussed with him, mild compression of the T11 vertebral body appears stable. There is also mild compression of the T10 vertebral body. To some degree this was present on CT scan performed 10/22/2013, but not readily evident on 01/05/2014 radiograph. I believe there has been mild interval progression in the degree of T10 compression deformity, although still mild. As we discussed, an MRI could be considered if felt indicated to determine whether this may be a pathologic process related to metastasis. Also, we discussed the fact that no information regarding the left chest wall nodule was obtained on chest radiograph, as it was not visualized. Further characterization with CT or MRI could be considered. The patient indicated that he would confer with his referring physician.   Electronically Signed   By: Skipper Cliche M.D.   On: 03/14/2014 16:56   03/14/2014    CLINICAL DATA:  Initial evaluation for heart nodule medial aspect of left breast or chest wall since Sunday, personal history of stage IV lymphoma and HIV  EXAM: CHEST  2 VIEW  COMPARISON:  01/05/2014  FINDINGS: Right PICC line in unchanged position.  Limited inspiratory effect.  Heart size upper normal but stable. Vascular pattern normal. Lungs clear. Bony thorax intact.  Stable mild T11 compression deformity. New mild T10 compression deformity.  IMPRESSION: No acute cardiopulmonary process. Would not necessarily expect for a soft tissue nodule to be visualized on chest radiograph. Consider further evaluation with ultrasound or CT scan for soft tissue nodule.  There appears to be new mild T10 compression deformity. This could be further evaluated with MRI of the spine. These results will be called to the ordering clinician or representative by the Radiologist Assistant, and communication documented in the PACS or zVision Dashboard.  Electronically Signed: By: Skipper Cliche M.D. On: 03/13/2014 16:46   Mr Lumbar Spine Wo Contrast  03/23/2014   CLINICAL DATA:  Multilevel infectious discitis. HIV infection. Lymphoma. Left flank pain of 2 days duration.  EXAM: MRI LUMBAR SPINE WITHOUT CONTRAST  TECHNIQUE: Multiplanar, multisequence MR imaging of the lumbar spine was performed. No intravenous contrast was administered.  COMPARISON:  CT scan same day.  Previous MRI 01/26/2014  FINDINGS: The scan covers the region from lower T9 through S3.  T9-10 does not appear involved.  T10-11: Discitis osteomyelitis at this level with fluid intensity material in the disc space and marrow edema throughout the T10 and T11 vertebral bodies. Slight loss of height at the endplates. No evidence of epidural abscess. No cord compression.  T11-12: No evidence of involvement at this level.  T12-L1 L1-2:  Normal.  L2-3: New abnormality on the left with a superior endplate Schmorl's node type pattern at L3. This could be evidence of early  disc space infection at this level or this could be  an actual an related Schmorl's node.  L3-4: Persistent evidence of discitis osteomyelitis at this level with edema in the disc and vertebral bodies but no evidence of progression. No spinal abscess.  L4-5:  No evidence of involvement of this disc space.  L5-S1: Worsened pattern with fluid throughout the disc space and worsened edema within the L5 and S1 vertebral bodies. There appears to be either a left posterior lateral disc herniation with caudal migration or some epidural abscess on the left behind S1. The thecal sac is not compressed. There would be potential to affect the S1 nerve roots.  IMPRESSION: Newly seen discitis osteomyelitis at T10-11. Fluid in the disc space. Early collapse of the endplates. No spinal abscess or neural compression.  Newly seen abnormality on the left at L2-3 which looks like a superior endplate Schmorl's node but could be evidence of early infection developing at the L2-3 disc level.  Persistent osteomyelitis discitis at L3-4, similar to the previous study. No abscess or neural compression.  Marked worsening of the appearance at L5-S1 with fluid throughout the disc space, edema within the L5 and S1 vertebral bodies, an abnormal material in the ventral epidural space that could be extruded disc material or ventral epidural abscess. The central canal is not compressed but there would be potential to affect the S1 nerve roots.   Electronically Signed   By: Nelson Chimes M.D.   On: 03/23/2014 20:21   Ct Renal Stone Study  03/23/2014   CLINICAL DATA:  Left flank pain.  HIV, cirrhosis, lymphoma  EXAM: CT ABDOMEN AND PELVIS WITHOUT CONTRAST  TECHNIQUE: Multidetector CT imaging of the abdomen and pelvis was performed following the standard protocol without IV contrast.  COMPARISON:  CT 11/10/2013  FINDINGS: On today's study, neither oral nor IV contrast was administered limiting evaluation of the bowel.  Mild right lower lobe atelectasis  with elevated right hemidiaphragm. Left lung base is clear.  Advanced cirrhotic liver. Prior cholecystectomy. Marked splenomegaly unchanged. Spleen measures 17 x 14 x 18 cm. Periportal adenopathy seen on the prior study difficult to evaluate on today's study due to lack of oral and IV contrast. Probably no change.  The kidneys are normal without obstruction or mass. No renal calculi. Urinary bladder normal.  Severe constipation with large amount of stool in the colon. No bowel obstruction. Previously noted thickening of the right colon not identified on today's study. This may have been infectious colitis which has resolved  No free fluid.  No pathologic adenopathy.  Progressive discitis at T10-11 with further bone loss and kyphosis. Progressive discitis and osteomyelitis at L3-4, and L5-S1 with sclerotic changes and endplate erosion. Epidural abscess not evaluated on unenhanced CT.  IMPRESSION: No renal calculi.  Advanced cirrhosis. Marked splenomegaly is unchanged. No free fluid.  Marked constipation.  Progressive discitis and osteomyelitis at T10-11, L3-4, L5-S1.   Electronically Signed   By: Franchot Gallo M.D.   On: 03/23/2014 15:34    Karen Kitchens [facility] Pager:[phone]  If 7PM-7AM, please contact night-coverage [email or web address] Password [email or web address] 04/04/2014, 3:19 PM   LOS: 12 days

## 2014-04-04 NOTE — Progress Notes (Signed)
NUTRITION FOLLOW UP  Pt meets criteria for severe MALNUTRITION in the context of chronic illness as evidenced by PO intake <75% for > one month, 18% body weight loss in 6 month, moderate muscle wasting and subcutaneous fat loss .  Intervention:   -Continue with Ensure Complete po BID, each supplement provides 350 kcal and 13 grams of protein  Nutrition Dx:   Unintentional wt loss related to chronic disease as evidenced by >30 lbs weight loss in 6 months; progressing  Goal:   Pt to meet >/= 90% of their estimated nutrition needs  Monitor:   Total protein/energy intake, labs, weights  Assessment:   38 year old male who has a past medical history of HIV disease; Cirrhosis; PTSD (post-traumatic stress disorder); Depressive disorder; Cancer; Lymphoma; and Anemia. patient was admitted to [facility] in August with enterococcal bacteremia but then found to have VRE and Candida started on fluconazole and vancomycin for 14 days. At that time he had MRI done for the back pain which was negative but then which progress to discitis and vertebral osteomyelitis. At that time patient was not found to be a good operative candidate and was discharged to a nursing center with 8 weeks of Cubicin and cefepime, with fluconazole. Patient has been getting IV cefepime at this time, and currently residing at the [facility].  Today patient came to the hospital with chief complaint of left-sided flank pain  Intake continues to be goodt; PO: 75-100%. He is consuming Ensure Complete supplements, although noted some refusals. Will continue with supplements due to malnutrition dx, as well as increased needs for HIV.  Discharge disposition is for SNF [facility]) once bed available. CSW and RNCM following.  Labs reviewed. No new labs to review.   Height: Ht Readings from Last 1 Encounters:  03/24/14 5\' 8"  (1.727 m)    Weight Status:   Wt Readings from Last 1 Encounters:  03/30/14 171 lb 11.8 oz (77.9 kg)    03/30/14  171 lb 11.8 oz (77.9 kg)   03/24/14 174 lb 9.6 oz (79.198 kg)   Re-estimated needs:  Kcal: 2100-2300  Protein: 101-11 grams  Fluid: 2.1-2.3 L  Skin: Intact  Diet Order: General   Intake/Output Summary (Last 24 hours) at 04/04/14 1350 Last data filed at 04/04/14 1339  Gross per 24 hour  Intake   1065 ml  Output    575 ml  Net    490 ml    Last BM: 04/03/14   Labs:   Recent Labs Lab 03/30/14 1230  NA 135*  K 3.7  CL 102  CO2 24  BUN 8  CREATININE 0.44*  CALCIUM 9.0  GLUCOSE 103*    CBG (last 3)  No results found for this basename: GLUCAP,  in the last 72 hours  Scheduled Meds: . atovaquone  750 mg Oral Daily  . azithromycin  1,200 mg Oral Q Sat  . buPROPion  300 mg Oral Daily  . clonazePAM  1 mg Oral QHS  . DAPTOmycin (CUBICIN)  IV  630 mg Intravenous Q24H  . Darunavir Ethanolate  800 mg Oral Q breakfast  . emtricitabine-tenofovir  1 tablet Oral QHS  . enoxaparin (LOVENOX) injection  40 mg Subcutaneous Q24H  . feeding supplement (ENSURE COMPLETE)  237 mL Oral BID BM  . gabapentin  400 mg Oral TID  . micafungin [facility]) IV  150 mg Intravenous Daily  . morphine  75 mg Oral Q12H  . ondansetron  4 mg Oral TID AC  .  polyethylene glycol  17 g Oral Daily  . ritonavir  100 mg Oral Q breakfast  . senna-docusate  2 tablet Oral Daily  . sucralfate  1 g Oral TID WC    Continuous Infusions: . sodium chloride 10 mL/hr at 04/04/14 0909    Heloise Gordan A. Jimmye Norman, RD, LDN Pager: [phone] After hours Pager: [phone]

## 2014-04-04 NOTE — Progress Notes (Signed)
Physical Therapy Treatment Patient Details Name: Samuel Schultz MRN: [id number] DOB: 07-Jun-1976 Today's Date: 04/04/2014    History of Present Illness Pt is a 38 y.o. male with history of HIV/AIDS, lymphoma, nonalcoholic cirrhosis, coagulopathy, pancytopenia presented to the ER because of worsening back pain. Patient states that he has been having pain in his low back last few days it has been radiating to his right leg. Denies any fever chills. Patient was recently admitted for discitis and is on IV daptomycin and ceftriaxone. MRI L-spine shows epidural abscess L5-S1 area. On-call neurosurgeon Dr. Saintclair Halsted was consulted and Dr. Saintclair Halsted at this time feels patient does not have any cauda equina features.    PT Comments    Progressing slowly, but with little encouragement pushed himself to get out in the hall.  Follow Up Recommendations  SNF;Supervision/Assistance - 24 hour     Equipment Recommendations  None recommended by PT    Recommendations for Other Services       Precautions / Restrictions Precautions Precautions: Fall Precaution Comments: educated on back precautions for safety and decreased pain    Mobility  Bed Mobility Overal bed mobility: Needs Assistance Bed Mobility: Sidelying to Sit;Rolling Rolling: Supervision Sidelying to sit: Supervision (with rail)       General bed mobility comments: cues for best technique  Transfers Overall transfer level: Needs assistance Equipment used: Rolling walker (2 wheeled) Transfers: Sit to/from Stand Sit to Stand: Min guard            Ambulation/Gait Ambulation/Gait assistance: Min guard Ambulation Distance (Feet): 200 Feet Assistive device: Rolling walker (2 wheeled) Gait Pattern/deviations: Step-through pattern;Trunk flexed Gait velocity: slow   General Gait Details: pt consistently walking behind the RW.  Cues not lasting long.   Stairs            Wheelchair Mobility    Modified Rankin (Stroke Patients  Only)       Balance Overall balance assessment: Needs assistance Sitting-balance support: Single extremity supported;Bilateral upper extremity supported;No upper extremity supported Sitting balance-Leahy Scale: Fair Sitting balance - Comments: prefers to use UE's for decreased pain, but able sit EOB without UE assist     Standing balance-Leahy Scale: Poor                      Cognition Arousal/Alertness: Awake/alert Behavior During Therapy: WFL for tasks assessed/performed Overall Cognitive Status: Within Functional Limits for tasks assessed                      Exercises      General Comments        Pertinent Vitals/Pain Pain Assessment: 0-10 Pain Score: 9  Pain Location: back at T10/11 Pain Descriptors / Indicators: Sharp Pain Intervention(s): Limited activity within patient's tolerance;Premedicated before session    Home Living                      Prior Function            PT Goals (current goals can now be found in the care plan section) Acute Rehab PT Goals PT Goal Formulation: With patient Time For Goal Achievement: 04/09/14 Potential to Achieve Goals: Fair Progress towards PT goals: Progressing toward goals    Frequency  Min 2X/week    PT Plan Current plan remains appropriate    Co-evaluation             End of Session Equipment Utilized During Treatment: Gait belt Activity  Tolerance: Patient limited by pain Patient left: in bed;with call bell/phone within reach     Time: 5409-8119 PT Time Calculation (min): 24 min  Charges:  $Gait Training: 8-22 mins $Therapeutic Activity: 8-22 mins                    G Codes:      Edra Riccardi, Tessie Fass 04/04/2014, 4:02 PM 04/04/2014  Donnella Sham, PT [phone]  (pager)

## 2014-04-05 DIAGNOSIS — K529 Noninfective gastroenteritis and colitis, unspecified: Secondary | ICD-10-CM

## 2014-04-05 LAB — BASIC METABOLIC PANEL
ANION GAP: 8 (ref 5–15)
BUN: 4 mg/dL — ABNORMAL LOW (ref 6–23)
CALCIUM: 8.7 mg/dL (ref 8.4–10.5)
CO2: 24 mEq/L (ref 19–32)
CREATININE: 0.47 mg/dL — AB (ref 0.50–1.35)
Chloride: 102 mEq/L (ref 96–112)
Glucose, Bld: 65 mg/dL — ABNORMAL LOW (ref 70–99)
Potassium: 4.1 mEq/L (ref 3.7–5.3)
Sodium: 134 mEq/L — ABNORMAL LOW (ref 137–147)

## 2014-04-05 LAB — CBC
HCT: 24.6 % — ABNORMAL LOW (ref 39.0–52.0)
Hemoglobin: 8.2 g/dL — ABNORMAL LOW (ref 13.0–17.0)
MCH: 32.8 pg (ref 26.0–34.0)
MCHC: 33.3 g/dL (ref 30.0–36.0)
MCV: 98.4 fL (ref 78.0–100.0)
Platelets: 96 10*3/uL — ABNORMAL LOW (ref 150–400)
RBC: 2.5 MIL/uL — ABNORMAL LOW (ref 4.22–5.81)
RDW: 16 % — AB (ref 11.5–15.5)
WBC: 1.8 10*3/uL — ABNORMAL LOW (ref 4.0–10.5)

## 2014-04-05 MED ORDER — ENSURE PUDDING PO PUDG
1.0000 | Freq: Three times a day (TID) | ORAL | Status: DC
  Administered 2014-04-05 – 2014-04-23 (×8): 1 via ORAL

## 2014-04-05 NOTE — Progress Notes (Signed)
PATIENT DETAILS Name: Samuel Schultz Age: 38 y.o. Sex: male Date of Birth: May 30, 1976 Admit Date: 03/23/2014 Admitting Physician Oswald Hillock, MD BCW:[id number], Olin Hauser, MD  Subjective: Not feeling as well today. Complains of left flank pain.   Reports a regular bowel movement 10/28.  Reports he walked in the hallway on 10/28 and did well!.  Back brace helps his pain.  Assessment/Plan: Principal Problem: Acute discitis:Recent h/o L3-4 discitis and L5-S1 abscess, came in with new discitis at T10-11, worsening L5-S1 abscess, and persistent L3-4 discitis. Patient discharged on cefepime, daptomycin and fluconazole. Seen by ID, placed on Micafungin and daptomycin 8 mg/kg. Per ID: continue above ABX for at least 6 more weeks (from 03/30/2014 ID note) with close follow up. Will have repeat MRI of spine in 3 weeks (approx 04/23/2014). Weekly CK and chemistries to be ordered while pt is on the ABXs. Stable, awaiting SNF bed.  Nausea with intermittent vomiting: resolved.   Suspect releated to narcotics, having BM's.Trial of scheduled Zofran for 1 day from 10/27. If symptoms persists, will check xray.  On 10/28 doing well.  Tolerating soft diet.  Back Pain: Secondary to diskitis with a component of anxiety. Continue with long acting MS Contin to 75 mg q 12 and short acting MSIR to 30 mg. On Klonopin QHS.Pain is overall better. Requiring less break through pain management. Back brace added for support   AIDS/HIV infection: Absolute CD4 count is 40 on 01/27/14. Patient is on Truvada, Prezista and Norvir. Azithromycin for prophylaxis. Infectious Disease recommendations are being followed.  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 Radiological evidence of cirrhosis and portal hypertension, with massive splenomegaly and esophageal varices. INR is 1.86. Normal LFTs. Hep C antibody is negative.   Constipation: On bowel regimen with Senna  and Miralax.  Pancytopenia: Likely multifactorial from bone marrow suppression from AIDS/cirrhosis and hypersplenism. Labs show stable leukopenia, anemia and thrombocytopenia.   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 Was treated with chemotherapy in 2009. Bone marrow biopsy done on 11/02/2013 in the [facility], showed no evidence of recurrent disease.    Disposition: Remain inpatient-awaiting SNF bed  Antibiotics: Micafungin 10/17>> 05/11/2014 Daptomycin continued since discharge last time >> 05/11/2014  DVT Prophylaxis: Prophylactic Lovenox  Code Status: Full code   Family Communication None.  Patient is alert and orientated.  Procedures:  None  CONSULTS:  ID  MEDICATIONS: Scheduled Meds: . atovaquone  750 mg Oral Daily  . azithromycin  1,200 mg Oral Q Sat  . buPROPion  300 mg Oral Daily  . clonazePAM  1 mg Oral QHS  . DAPTOmycin (CUBICIN)  IV  630 mg Intravenous Q24H  . Darunavir Ethanolate  800 mg Oral Q breakfast  . emtricitabine-tenofovir  1 tablet Oral QHS  . enoxaparin (LOVENOX) injection  40 mg Subcutaneous Q24H  . feeding supplement (ENSURE COMPLETE)  237 mL Oral BID BM  . feeding supplement (ENSURE)  1 Container Oral TID BM  . gabapentin  400 mg Oral TID  . micafungin [facility]) IV  150 mg Intravenous Daily  . morphine  75 mg Oral Q12H  . ondansetron  4 mg Oral TID AC  . polyethylene glycol  17 g Oral Daily  . ritonavir  100 mg Oral Q breakfast  . senna-docusate  2 tablet Oral Daily  . sucralfate  1 g Oral TID  WC   Continuous Infusions: . sodium chloride 10 mL/hr at 04/04/14 0909   PRN Meds:.bisacodyl, methocarbamol, morphine, sodium chloride, sodium phosphate, temazepam  Antibiotics: Anti-infectives   Start     Dose/Rate Route Frequency Ordered Stop   03/29/14 0000  DAPTOmycin 630 mg in sodium chloride 0.9 % 100 mL     630 mg 225.2 mL/hr over 30  Minutes Intravenous Every 24 hours 03/29/14 1056     03/29/14 0000  micafungin 150 mg in sodium chloride 0.9 % 100 mL     150 mg 100 mL/hr over 1 Hours Intravenous Daily 03/29/14 1056     03/25/14 0800  DAPTOmycin (CUBICIN) 630 mg in sodium chloride 0.9 % IVPB    Comments:  ~8 mg/kg   630 mg 225.2 mL/hr over 30 Minutes Intravenous Every 24 hours 03/24/14 1337     03/24/14 1400  micafungin (MYCAMINE) 150 mg in sodium chloride 0.9 % 100 mL IVPB     150 mg 100 mL/hr over 1 Hours Intravenous Daily 03/24/14 1326     03/24/14 1000  azithromycin (ZITHROMAX) tablet 1,200 mg     1,200 mg Oral Every Sat 03/24/14 0113     03/24/14 1000  ceFEPIme (MAXIPIME) 2 g in dextrose 5 % 50 mL IVPB  Status:  Discontinued     2 g 100 mL/hr over 30 Minutes Intravenous Every 12 hours 03/24/14 0113 03/24/14 1326   03/24/14 1000  fluconazole (DIFLUCAN) tablet 400 mg  Status:  Discontinued     400 mg Oral Daily 03/24/14 0113 03/24/14 1326   03/24/14 0800  Darunavir Ethanolate (PREZISTA) tablet 800 mg     800 mg Oral Daily with breakfast 03/24/14 0113     03/24/14 0800  ritonavir (NORVIR) capsule 100 mg     100 mg Oral Daily with breakfast 03/24/14 0113     03/24/14 0145  DAPTOmycin (CUBICIN) 630 mg in sodium chloride 0.9 % IVPB    Comments:  ~8 mg/kg   630 mg 225.2 mL/hr over 30 Minutes Intravenous  Once 03/24/14 0140 03/24/14 0344   03/24/14 0112  atovaquone (MEPRON) 750 MG/5ML suspension 750 mg     750 mg Oral Daily 03/24/14 0113     03/24/14 0112  emtricitabine-tenofovir (TRUVADA) 200-300 MG per tablet 1 tablet     1 tablet Oral Daily at bedtime 03/24/14 0113         PHYSICAL EXAM: Vital signs in last 24 hours: Filed Vitals:   04/05/14 0000 04/05/14 0012 04/05/14 0617 04/05/14 1300  BP:  117/60 120/68 103/57  Pulse:  90 93 81  Temp:  98 F (36.7 C) 98.3 F (36.8 C) 98.7 F (37.1 C)  TempSrc:  Oral Oral Oral  Resp: 18 16 15 18   Height:      Weight:      SpO2:  96% 91% 94%    Weight change:    Filed Weights   03/23/14 1241 03/24/14 0057 03/30/14 0743  Weight: 74.844 kg (165 lb) 79.198 kg (174 lb 9.6 oz) 77.9 kg (171 lb 11.8 oz)   Body mass index is 26.12 kg/(m^2).   Gen Exam: Awake and alert with clear speech.  Appears pale.  Exam essentially unchanged. Neck: Supple, No JVD.   Chest: B/L Clear.  No rales  CVS: S1 S2 Regular, no murmurs.  Abdomen: soft, BS +, non tender, non distended.  Extremities: no edema, lower extremities warm to touch. Neurologic: Non Focal. Good strength bilateral lower ext. Skin: No Rash.  Intake/Output from previous day:  Intake/Output Summary (Last 24 hours) at 04/05/14 1404 Last data filed at 04/05/14 1245  Gross per 24 hour  Intake  86.17 ml  Output   1150 ml  Net -1063.83 ml     LAB RESULTS: CBC  Recent Labs Lab 03/30/14 1230 04/05/14 0542  WBC 2.2* 1.8*  HGB 8.5* 8.2*  HCT 24.8* 24.6*  PLT 91* 96*  MCV 94.7 98.4  MCH 32.4 32.8  MCHC 34.3 33.3  RDW 15.8* 16.0*  LYMPHSABS 0.7  --   MONOABS 0.3  --   EOSABS 0.1  --   BASOSABS 0.0  --     Chemistries   Recent Labs Lab 03/30/14 1230 04/05/14 0542  NA 135* 134*  K 3.7 4.1  CL 102 102  CO2 24 24  GLUCOSE 103* 65*  BUN 8 4*  CREATININE 0.44* 0.47*  CALCIUM 9.0 8.7     RADIOLOGY STUDIES/RESULTS: Dg Chest 2 View  03/14/2014   ADDENDUM REPORT: 03/14/2014 16:56  ADDENDUM: I returned the patient's phone call at 16 50 on 03/14/2014. As I discussed with him, mild compression of the T11 vertebral body appears stable. There is also mild compression of the T10 vertebral body. To some degree this was present on CT scan performed 10/22/2013, but not readily evident on 01/05/2014 radiograph. I believe there has been mild interval progression in the degree of T10 compression deformity, although still mild. As we discussed, an MRI could be considered if felt indicated to determine whether this may be a pathologic process related to metastasis. Also, we discussed the fact that  no information regarding the left chest wall nodule was obtained on chest radiograph, as it was not visualized. Further characterization with CT or MRI could be considered. The patient indicated that he would confer with his referring physician.   Electronically Signed   By: Skipper Cliche M.D.   On: 03/14/2014 16:56   03/14/2014   CLINICAL DATA:  Initial evaluation for heart nodule medial aspect of left breast or chest wall since Sunday, personal history of stage IV lymphoma and HIV  EXAM: CHEST  2 VIEW  COMPARISON:  01/05/2014  FINDINGS: Right PICC line in unchanged position.  Limited inspiratory effect.  Heart size upper normal but stable. Vascular pattern normal. Lungs clear. Bony thorax intact.  Stable mild T11 compression deformity. New mild T10 compression deformity.  IMPRESSION: No acute cardiopulmonary process. Would not necessarily expect for a soft tissue nodule to be visualized on chest radiograph. Consider further evaluation with ultrasound or CT scan for soft tissue nodule.  There appears to be new mild T10 compression deformity. This could be further evaluated with MRI of the spine. These results will be called to the ordering clinician or representative by the Radiologist Assistant, and communication documented in the PACS or zVision Dashboard.  Electronically Signed: By: Skipper Cliche M.D. On: 03/13/2014 16:46   Mr Lumbar Spine Wo Contrast  03/23/2014   CLINICAL DATA:  Multilevel infectious discitis. HIV infection. Lymphoma. Left flank pain of 2 days duration.  EXAM: MRI LUMBAR SPINE WITHOUT CONTRAST  TECHNIQUE: Multiplanar, multisequence MR imaging of the lumbar spine was performed. No intravenous contrast was administered.  COMPARISON:  CT scan same day.  Previous MRI 01/26/2014  FINDINGS: The scan covers the region from lower T9 through S3.  T9-10 does not appear involved.  T10-11: Discitis osteomyelitis at this level with fluid intensity material in the disc space and marrow edema  throughout the T10 and  T11 vertebral bodies. Slight loss of height at the endplates. No evidence of epidural abscess. No cord compression.  T11-12: No evidence of involvement at this level.  T12-L1 L1-2:  Normal.  L2-3: New abnormality on the left with a superior endplate Schmorl's node type pattern at L3. This could be evidence of early disc space infection at this level or this could be an actual an related Schmorl's node.  L3-4: Persistent evidence of discitis osteomyelitis at this level with edema in the disc and vertebral bodies but no evidence of progression. No spinal abscess.  L4-5:  No evidence of involvement of this disc space.  L5-S1: Worsened pattern with fluid throughout the disc space and worsened edema within the L5 and S1 vertebral bodies. There appears to be either a left posterior lateral disc herniation with caudal migration or some epidural abscess on the left behind S1. The thecal sac is not compressed. There would be potential to affect the S1 nerve roots.  IMPRESSION: Newly seen discitis osteomyelitis at T10-11. Fluid in the disc space. Early collapse of the endplates. No spinal abscess or neural compression.  Newly seen abnormality on the left at L2-3 which looks like a superior endplate Schmorl's node but could be evidence of early infection developing at the L2-3 disc level.  Persistent osteomyelitis discitis at L3-4, similar to the previous study. No abscess or neural compression.  Marked worsening of the appearance at L5-S1 with fluid throughout the disc space, edema within the L5 and S1 vertebral bodies, an abnormal material in the ventral epidural space that could be extruded disc material or ventral epidural abscess. The central canal is not compressed but there would be potential to affect the S1 nerve roots.   Electronically Signed   By: Nelson Chimes M.D.   On: 03/23/2014 20:21   Ct Renal Stone Study  03/23/2014   CLINICAL DATA:  Left flank pain.  HIV, cirrhosis, lymphoma  EXAM:  CT ABDOMEN AND PELVIS WITHOUT CONTRAST  TECHNIQUE: Multidetector CT imaging of the abdomen and pelvis was performed following the standard protocol without IV contrast.  COMPARISON:  CT 11/10/2013  FINDINGS: On today's study, neither oral nor IV contrast was administered limiting evaluation of the bowel.  Mild right lower lobe atelectasis with elevated right hemidiaphragm. Left lung base is clear.  Advanced cirrhotic liver. Prior cholecystectomy. Marked splenomegaly unchanged. Spleen measures 17 x 14 x 18 cm. Periportal adenopathy seen on the prior study difficult to evaluate on today's study due to lack of oral and IV contrast. Probably no change.  The kidneys are normal without obstruction or mass. No renal calculi. Urinary bladder normal.  Severe constipation with large amount of stool in the colon. No bowel obstruction. Previously noted thickening of the right colon not identified on today's study. This may have been infectious colitis which has resolved  No free fluid.  No pathologic adenopathy.  Progressive discitis at T10-11 with further bone loss and kyphosis. Progressive discitis and osteomyelitis at L3-4, and L5-S1 with sclerotic changes and endplate erosion. Epidural abscess not evaluated on unenhanced CT.  IMPRESSION: No renal calculi.  Advanced cirrhosis. Marked splenomegaly is unchanged. No free fluid.  Marked constipation.  Progressive discitis and osteomyelitis at T10-11, L3-4, L5-S1.   Electronically Signed   By: Franchot Gallo M.D.   On: 03/23/2014 15:34    Karen Kitchens [facility] Pager:[phone]  If 7PM-7AM, please contact night-coverage [email or web address] Password [email or web address] 04/05/2014, 2:04 PM   LOS: 13 days

## 2014-04-05 NOTE — Progress Notes (Signed)
Addendum  Patient seen and examined, chart and data base reviewed.  I agree with the above assessment and plan.  For full details please see Mrs. Imogene Burn PA note.  No changes clinically waiting for SNF bed.   Birdie Hopes, MD [facility] Pager: [phone] 04/05/2014, 2:14 PM

## 2014-04-06 DIAGNOSIS — G062 Extradural and subdural abscess, unspecified: Secondary | ICD-10-CM

## 2014-04-06 NOTE — Progress Notes (Signed)
PATIENT DETAILS Name: Samuel Schultz Age: 38 y.o. Sex: male Date of Birth: 1975/12/23 Admit Date: 03/23/2014 Admitting Physician Oswald Hillock, MD ZOX:WRUE, PAMELA, MD  Subjective: No changes clinically, still complaining about left flank pain. Waiting for SNF bed, pending placement.  Assessment/Plan:  Principal Problem: Acute discitis:Recent h/o L3-4 discitis and L5-S1 abscess, came in with new discitis at T10-11, worsening L5-S1 abscess, and persistent L3-4 discitis. Patient discharged on cefepime, daptomycin and fluconazole. Seen by ID, placed on Micafungin and daptomycin 8 mg/kg. Per ID: continue above ABX for at least 6 more weeks (from 03/30/2014 ID note) with close follow up. Will have repeat MRI of spine in 3 weeks (approx 04/23/2014). Weekly CK and chemistries to be ordered while pt is on the ABXs. Stable, awaiting SNF bed.  Nausea with intermittent vomiting: resolved.   Suspect releated to narcotics, having BM's.Trial of scheduled Zofran for 1 day from 10/27. If symptoms persists, will check xray.  On 10/28 doing well.  Tolerating soft diet.  Back Pain: Secondary to diskitis with a component of anxiety. Continue with long acting MS Contin to 75 mg q 12 and short acting MSIR to 30 mg. On Klonopin QHS.Pain is overall better. Requiring less break through pain management. Back brace added for support   AIDS/HIV infection: Absolute CD4 count is 40 on 01/27/14. Patient is on Truvada, Prezista and Norvir. Azithromycin for prophylaxis. Infectious Disease recommendations are being followed.  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 Radiological evidence of cirrhosis and portal hypertension, with massive splenomegaly and esophageal varices. INR is 1.86. Normal LFTs. Hep C antibody is negative.   Constipation: On bowel regimen with Senna and Miralax.  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 Labs show stable leukopenia, anemia and thrombocytopenia.   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 Was treated with chemotherapy in 2009. Bone marrow biopsy done on 11/02/2013 in the [facility], showed no evidence of recurrent disease.    Disposition: Remain inpatient-awaiting SNF bed  Antibiotics: Micafungin 10/17>> 05/11/2014 Daptomycin continued since discharge last time >> 05/11/2014  DVT Prophylaxis: Prophylactic Lovenox  Code Status: Full code   Family Communication None.  Patient is alert and orientated.  Procedures:  None  CONSULTS:  ID  MEDICATIONS: Scheduled Meds: . atovaquone  750 mg Oral Daily  . azithromycin  1,200 mg Oral Q Sat  . buPROPion  300 mg Oral Daily  . clonazePAM  1 mg Oral QHS  . DAPTOmycin (CUBICIN)  IV  630 mg Intravenous Q24H  . Darunavir Ethanolate  800 mg Oral Q breakfast  . emtricitabine-tenofovir  1 tablet Oral QHS  . enoxaparin (LOVENOX) injection  40 mg Subcutaneous Q24H  . feeding supplement (ENSURE COMPLETE)  237 mL Oral BID BM  . feeding supplement (ENSURE)  1 Container Oral TID BM  . gabapentin  400 mg Oral TID  . micafungin [facility]) IV  150 mg Intravenous Daily  . morphine  75 mg Oral Q12H  . ondansetron  4 mg Oral TID AC  . polyethylene glycol  17 g Oral Daily  . ritonavir  100 mg Oral Q breakfast  . senna-docusate  2 tablet Oral Daily  . sucralfate  1 g Oral TID WC   Continuous Infusions: . sodium chloride 10 mL/hr at 04/04/14 0909   PRN Meds:.bisacodyl, methocarbamol, morphine, sodium  chloride, sodium phosphate, temazepam  Antibiotics: Anti-infectives   Start     Dose/Rate Route Frequency Ordered Stop   03/29/14 0000  DAPTOmycin 630 mg in sodium chloride 0.9 % 100 mL     630 mg 225.2 mL/hr over 30 Minutes Intravenous Every 24 hours 03/29/14 1056     03/29/14 0000   micafungin 150 mg in sodium chloride 0.9 % 100 mL     150 mg 100 mL/hr over 1 Hours Intravenous Daily 03/29/14 1056     03/25/14 0800  DAPTOmycin (CUBICIN) 630 mg in sodium chloride 0.9 % IVPB    Comments:  ~8 mg/kg   630 mg 225.2 mL/hr over 30 Minutes Intravenous Every 24 hours 03/24/14 1337     03/24/14 1400  micafungin (MYCAMINE) 150 mg in sodium chloride 0.9 % 100 mL IVPB     150 mg 100 mL/hr over 1 Hours Intravenous Daily 03/24/14 1326     03/24/14 1000  azithromycin (ZITHROMAX) tablet 1,200 mg     1,200 mg Oral Every Sat 03/24/14 0113     03/24/14 1000  ceFEPIme (MAXIPIME) 2 g in dextrose 5 % 50 mL IVPB  Status:  Discontinued     2 g 100 mL/hr over 30 Minutes Intravenous Every 12 hours 03/24/14 0113 03/24/14 1326   03/24/14 1000  fluconazole (DIFLUCAN) tablet 400 mg  Status:  Discontinued     400 mg Oral Daily 03/24/14 0113 03/24/14 1326   03/24/14 0800  Darunavir Ethanolate (PREZISTA) tablet 800 mg     800 mg Oral Daily with breakfast 03/24/14 0113     03/24/14 0800  ritonavir (NORVIR) capsule 100 mg     100 mg Oral Daily with breakfast 03/24/14 0113     03/24/14 0145  DAPTOmycin (CUBICIN) 630 mg in sodium chloride 0.9 % IVPB    Comments:  ~8 mg/kg   630 mg 225.2 mL/hr over 30 Minutes Intravenous  Once 03/24/14 0140 03/24/14 0344   03/24/14 0112  atovaquone (MEPRON) 750 MG/5ML suspension 750 mg     750 mg Oral Daily 03/24/14 0113     03/24/14 0112  emtricitabine-tenofovir (TRUVADA) 200-300 MG per tablet 1 tablet     1 tablet Oral Daily at bedtime 03/24/14 0113         PHYSICAL EXAM: Vital signs in last 24 hours: Filed Vitals:   04/05/14 0617 04/05/14 1300 04/05/14 2223 04/06/14 0631  BP: 120/68 103/57 116/64 121/64  Pulse: 93 81 94 94  Temp: 98.3 F (36.8 C) 98.7 F (37.1 C) 98.4 F (36.9 C) 98.5 F (36.9 C)  TempSrc: Oral Oral Oral Oral  Resp: 15 18 18 19   Height:      Weight:      SpO2: 91% 94% 98% 94%    Weight change:  Filed Weights   03/23/14 1241  03/24/14 0057 03/30/14 0743  Weight: 74.844 kg (165 lb) 79.198 kg (174 lb 9.6 oz) 77.9 kg (171 lb 11.8 oz)   Body mass index is 26.12 kg/(m^2).   Gen Exam: Awake and alert with clear speech.  Appears pale.  Exam essentially unchanged. Neck: Supple, No JVD.   Chest: B/L Clear.  No rales  CVS: S1 S2 Regular, no murmurs.  Abdomen: soft, BS +, non tender, non distended.  Extremities: no edema, lower extremities warm to touch. Neurologic: Non Focal. Good strength bilateral lower ext. Skin: No Rash.     Intake/Output from previous day:  Intake/Output Summary (Last 24 hours) at 04/06/14 1319 Last data filed at  04/06/14 1941  Gross per 24 hour  Intake     10 ml  Output    500 ml  Net   -490 ml     LAB RESULTS: CBC  Recent Labs Lab 04/05/14 0542  WBC 1.8*  HGB 8.2*  HCT 24.6*  PLT 96*  MCV 98.4  MCH 32.8  MCHC 33.3  RDW 16.0*    Chemistries   Recent Labs Lab 04/05/14 0542  NA 134*  K 4.1  CL 102  CO2 24  GLUCOSE 65*  BUN 4*  CREATININE 0.47*  CALCIUM 8.7     RADIOLOGY STUDIES/RESULTS: Dg Chest 2 View  03/14/2014   ADDENDUM REPORT: 03/14/2014 16:56  ADDENDUM: I returned the patient's phone call at 16 50 on 03/14/2014. As I discussed with him, mild compression of the T11 vertebral body appears stable. There is also mild compression of the T10 vertebral body. To some degree this was present on CT scan performed 10/22/2013, but not readily evident on 01/05/2014 radiograph. I believe there has been mild interval progression in the degree of T10 compression deformity, although still mild. As we discussed, an MRI could be considered if felt indicated to determine whether this may be a pathologic process related to metastasis. Also, we discussed the fact that no information regarding the left chest wall nodule was obtained on chest radiograph, as it was not visualized. Further characterization with CT or MRI could be considered. The patient indicated that he would confer  with his referring physician.   Electronically Signed   By: Skipper Cliche M.D.   On: 03/14/2014 16:56   03/14/2014   CLINICAL DATA:  Initial evaluation for heart nodule medial aspect of left breast or chest wall since Sunday, personal history of stage IV lymphoma and HIV  EXAM: CHEST  2 VIEW  COMPARISON:  01/05/2014  FINDINGS: Right PICC line in unchanged position.  Limited inspiratory effect.  Heart size upper normal but stable. Vascular pattern normal. Lungs clear. Bony thorax intact.  Stable mild T11 compression deformity. New mild T10 compression deformity.  IMPRESSION: No acute cardiopulmonary process. Would not necessarily expect for a soft tissue nodule to be visualized on chest radiograph. Consider further evaluation with ultrasound or CT scan for soft tissue nodule.  There appears to be new mild T10 compression deformity. This could be further evaluated with MRI of the spine. These results will be called to the ordering clinician or representative by the Radiologist Assistant, and communication documented in the PACS or zVision Dashboard.  Electronically Signed: By: Skipper Cliche M.D. On: 03/13/2014 16:46   Mr Lumbar Spine Wo Contrast  03/23/2014   CLINICAL DATA:  Multilevel infectious discitis. HIV infection. Lymphoma. Left flank pain of 2 days duration.  EXAM: MRI LUMBAR SPINE WITHOUT CONTRAST  TECHNIQUE: Multiplanar, multisequence MR imaging of the lumbar spine was performed. No intravenous contrast was administered.  COMPARISON:  CT scan same day.  Previous MRI 01/26/2014  FINDINGS: The scan covers the region from lower T9 through S3.  T9-10 does not appear involved.  T10-11: Discitis osteomyelitis at this level with fluid intensity material in the disc space and marrow edema throughout the T10 and T11 vertebral bodies. Slight loss of height at the endplates. No evidence of epidural abscess. No cord compression.  T11-12: No evidence of involvement at this level.  T12-L1 L1-2:  Normal.  L2-3:  New abnormality on the left with a superior endplate Schmorl's node type pattern at L3. This could be evidence of early  disc space infection at this level or this could be an actual an related Schmorl's node.  L3-4: Persistent evidence of discitis osteomyelitis at this level with edema in the disc and vertebral bodies but no evidence of progression. No spinal abscess.  L4-5:  No evidence of involvement of this disc space.  L5-S1: Worsened pattern with fluid throughout the disc space and worsened edema within the L5 and S1 vertebral bodies. There appears to be either a left posterior lateral disc herniation with caudal migration or some epidural abscess on the left behind S1. The thecal sac is not compressed. There would be potential to affect the S1 nerve roots.  IMPRESSION: Newly seen discitis osteomyelitis at T10-11. Fluid in the disc space. Early collapse of the endplates. No spinal abscess or neural compression.  Newly seen abnormality on the left at L2-3 which looks like a superior endplate Schmorl's node but could be evidence of early infection developing at the L2-3 disc level.  Persistent osteomyelitis discitis at L3-4, similar to the previous study. No abscess or neural compression.  Marked worsening of the appearance at L5-S1 with fluid throughout the disc space, edema within the L5 and S1 vertebral bodies, an abnormal material in the ventral epidural space that could be extruded disc material or ventral epidural abscess. The central canal is not compressed but there would be potential to affect the S1 nerve roots.   Electronically Signed   By: Nelson Chimes M.D.   On: 03/23/2014 20:21   Ct Renal Stone Study  03/23/2014   CLINICAL DATA:  Left flank pain.  HIV, cirrhosis, lymphoma  EXAM: CT ABDOMEN AND PELVIS WITHOUT CONTRAST  TECHNIQUE: Multidetector CT imaging of the abdomen and pelvis was performed following the standard protocol without IV contrast.  COMPARISON:  CT 11/10/2013  FINDINGS: On today's  study, neither oral nor IV contrast was administered limiting evaluation of the bowel.  Mild right lower lobe atelectasis with elevated right hemidiaphragm. Left lung base is clear.  Advanced cirrhotic liver. Prior cholecystectomy. Marked splenomegaly unchanged. Spleen measures 17 x 14 x 18 cm. Periportal adenopathy seen on the prior study difficult to evaluate on today's study due to lack of oral and IV contrast. Probably no change.  The kidneys are normal without obstruction or mass. No renal calculi. Urinary bladder normal.  Severe constipation with large amount of stool in the colon. No bowel obstruction. Previously noted thickening of the right colon not identified on today's study. This may have been infectious colitis which has resolved  No free fluid.  No pathologic adenopathy.  Progressive discitis at T10-11 with further bone loss and kyphosis. Progressive discitis and osteomyelitis at L3-4, and L5-S1 with sclerotic changes and endplate erosion. Epidural abscess not evaluated on unenhanced CT.  IMPRESSION: No renal calculi.  Advanced cirrhosis. Marked splenomegaly is unchanged. No free fluid.  Marked constipation.  Progressive discitis and osteomyelitis at T10-11, L3-4, L5-S1.   Electronically Signed   By: Franchot Gallo M.D.   On: 03/23/2014 15:34    Chue Berkovich A, MD Pager: [phone]  If 7PM-7AM, please contact night-coverage [email or web address] Password [email or web address] 04/06/2014, 1:19 PM   LOS: 14 days

## 2014-04-06 NOTE — Progress Notes (Signed)
Physical Therapy Treatment Patient Details Name: Carlo Guevarra MRN: [id number] DOB: [birth year] Today's Date: 04/06/2014    History of Present Illness Pt is a 38 y.o. male with history of HIV/AIDS, lymphoma, nonalcoholic cirrhosis, coagulopathy, pancytopenia presented to the ER because of worsening back pain. Patient states that he has been having pain in his low back last few days it has been radiating to his right leg. Denies any fever chills. Patient was recently admitted for discitis and is on IV daptomycin and ceftriaxone. MRI L-spine shows epidural abscess L5-S1 area. On-call neurosurgeon Dr. Saintclair Halsted was consulted and Dr. Saintclair Halsted at this time feels patient does not have any cauda equina features.    PT Comments    Progressing well despite the high level of pain  Follow Up Recommendations  SNF;Supervision/Assistance - 24 hour     Equipment Recommendations  None recommended by PT    Recommendations for Other Services       Precautions / Restrictions Precautions Precautions: Fall Precaution Comments: educated on back precautions for safety and decreased pain    Mobility  Bed Mobility   Bed Mobility: Sidelying to Sit;Sit to Supine   Sidelying to sit: Supervision Supine to sit: Supervision     General bed mobility comments: cues for best technique  Transfers Overall transfer level: Needs assistance Equipment used: Rolling walker (2 wheeled) Transfers: Sit to/from Stand Sit to Stand: Supervision         General transfer comment: safe technique  Ambulation/Gait Ambulation/Gait assistance: Supervision Ambulation Distance (Feet): 230 Feet Assistive device: Rolling walker (2 wheeled) Gait Pattern/deviations: Step-through pattern;Trunk flexed Gait velocity: slow   General Gait Details: cues to close gap behind the RW in order to decrease pain and stress on the back   Stairs            Wheelchair Mobility    Modified Rankin (Stroke Patients Only)        Balance Overall balance assessment: Needs assistance Sitting-balance support: No upper extremity supported Sitting balance-Leahy Scale: Fair Sitting balance - Comments: prefers to use UE's for decreased pain, but able sit EOB without UE assist     Standing balance-Leahy Scale: Fair                      Cognition Arousal/Alertness: Awake/alert Behavior During Therapy: WFL for tasks assessed/performed Overall Cognitive Status: Within Functional Limits for tasks assessed         Following Commands: Follows one step commands consistently Safety/Judgement: Decreased awareness of safety          Exercises      General Comments        Pertinent Vitals/Pain Pain Assessment: 0-10 Pain Score: 8  Pain Descriptors / Indicators: Aching;Sharp Pain Intervention(s): Limited activity within patient's tolerance;Premedicated before session    Home Living                      Prior Function            PT Goals (current goals can now be found in the care plan section) Acute Rehab PT Goals PT Goal Formulation: With patient Time For Goal Achievement: 04/09/14 Potential to Achieve Goals: Fair Progress towards PT goals: Progressing toward goals    Frequency  Min 2X/week    PT Plan Current plan remains appropriate    Co-evaluation             End of Session     Patient left: in bed;with call  bell/phone within reach     Time: 1319-1338 PT Time Calculation (min): 19 min  Charges:  $Gait Training: 8-22 mins                    G Codes:      Eldor Conaway, Tessie Fass 04/06/2014, 1:45 PM 04/06/2014  Donnella Sham, PT [phone]  (pager)

## 2014-04-06 NOTE — Clinical Social Work Note (Addendum)
CSW has met with patient at bedside to update on bed at [facility] in [place]. CSW raised the option of the patient going home with [facility] and receiving the medications he is on from home, but the patient states this will not be an option and [facility] Ferries agrees.   Liz Beach MSW, [place], [place], [phone]

## 2014-04-07 MED ORDER — MORPHINE SULFATE ER 30 MG PO TBCR
60.0000 mg | EXTENDED_RELEASE_TABLET | Freq: Two times a day (BID) | ORAL | Status: DC
  Administered 2014-04-07 – 2014-04-23 (×30): 60 mg via ORAL
  Filled 2014-04-07 (×18): qty 2
  Filled 2014-04-07: qty 4
  Filled 2014-04-07 (×12): qty 2

## 2014-04-07 MED ORDER — ONDANSETRON HCL 4 MG/2ML IJ SOLN
4.0000 mg | Freq: Four times a day (QID) | INTRAMUSCULAR | Status: DC | PRN
  Administered 2014-04-07: 4 mg via INTRAVENOUS
  Filled 2014-04-07 (×3): qty 2

## 2014-04-07 NOTE — Progress Notes (Signed)
PATIENT DETAILS Name: Samuel Schultz Age: 38 y.o. 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 Patient admitted to [facility], from [facility] rehabilitation facility, patient was recently in the hospital for treatment of L3-L4 discitis and abscess at the L5-S1 level. Patient was discharged on cefepime, daptomycin and fluconazole. In the nursing facility patient was complaining about worsening back pain, brought back to the ED and repeat MRI of the back showed worsening of L3-4 discitis and new discitis at the level of T10-11. Patient admitted to the hospital for further evaluation, started on daptomycin and micafungin. Patient did very well and pain improved and was able to get up and walk around with help. Patient waiting for SNF bed, MRI to be done in one to 2 weeks to evaluate progression of the treatment.  Subjective: No changes clinically, still complaining about left flank pain. Waiting for SNF bed, pending placement.  Assessment/Plan:   Principal Problem: Acute discitis:Recent h/o L3-4 discitis and L5-S1 abscess, came in with new discitis at T10-11, worsening L5-S1 abscess, and persistent L3-4 discitis. Patient discharged on cefepime, daptomycin and fluconazole. Seen by ID, placed on Micafungin and daptomycin 8 mg/kg. Per ID: continue above ABX for at least 6 more weeks (from 03/30/2014 ID note) with close follow up. Will have repeat MRI of spine in 3 weeks (approx 04/23/2014). Weekly CK and chemistries to be ordered while pt is on the ABXs. Stable, awaiting SNF bed.  Nausea with intermittent vomiting: resolved.   Suspect releated to narcotics, having BM's.Trial of scheduled Zofran for 1 day from 10/27. If symptoms persists, will check xray.  On 10/28 doing  well.  Tolerating soft diet.  Back Pain:  Secondary to diskitis with a component of anxiety.  Continue with long acting MS Contin to 75 mg q 12 and short acting MSIR to 30 mg.  On Klonopin QHS.Pain is overall better. Back brace added for support. Patient requested his MS Contin to be dropped to 50 as he his pain is improving. 10/31.  AIDS/HIV infection: Absolute CD4 count is 40 on 01/27/14. Patient is on Truvada, Prezista and Norvir. Azithromycin for prophylaxis. Infectious Disease recommendations are being followed.  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 Radiological evidence of cirrhosis and portal hypertension, with massive splenomegaly and esophageal varices. INR is 1.86. Normal LFTs. Hep C antibody is negative.   Constipation: On bowel regimen with Senna and Miralax.  Pancytopenia: Likely multifactorial from bone marrow suppression from AIDS/cirrhosis and hypersplenism. Labs show stable leukopenia, anemia and thrombocytopenia.   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 Was treated with chemotherapy in 2009. Bone marrow biopsy done on 11/02/2013 in the [facility], showed no evidence of recurrent disease.    Disposition: Remain inpatient-awaiting SNF bed  Antibiotics: Micafungin 10/17>> 05/11/2014 Daptomycin continued since discharge last time >> 05/11/2014  DVT Prophylaxis: Prophylactic Lovenox  Code Status: Full code   Family Communication None.  Patient is alert and orientated.  Procedures:  None  CONSULTS:  ID  MEDICATIONS: Scheduled Meds: . atovaquone  750 mg Oral Daily  . azithromycin  1,200 mg Oral Q Sat  . buPROPion  300 mg Oral Daily  .  clonazePAM  1 mg Oral QHS  . DAPTOmycin (CUBICIN)  IV  630 mg Intravenous Q24H  . Darunavir Ethanolate  800 mg Oral Q breakfast  .  emtricitabine-tenofovir  1 tablet Oral QHS  . enoxaparin (LOVENOX) injection  40 mg Subcutaneous Q24H  . feeding supplement (ENSURE COMPLETE)  237 mL Oral BID BM  . feeding supplement (ENSURE)  1 Container Oral TID BM  . gabapentin  400 mg Oral TID  . micafungin [facility]) IV  150 mg Intravenous Daily  . morphine  75 mg Oral Q12H  . ondansetron  4 mg Oral TID AC  . polyethylene glycol  17 g Oral Daily  . ritonavir  100 mg Oral Q breakfast  . senna-docusate  2 tablet Oral Daily  . sucralfate  1 g Oral TID WC   Continuous Infusions: . sodium chloride 10 mL/hr (04/06/14 2117)   PRN Meds:.bisacodyl, methocarbamol, morphine, sodium chloride, sodium phosphate, temazepam  Antibiotics: Anti-infectives   Start     Dose/Rate Route Frequency Ordered Stop   03/29/14 0000  DAPTOmycin 630 mg in sodium chloride 0.9 % 100 mL     630 mg 225.2 mL/hr over 30 Minutes Intravenous Every 24 hours 03/29/14 1056     03/29/14 0000  micafungin 150 mg in sodium chloride 0.9 % 100 mL     150 mg 100 mL/hr over 1 Hours Intravenous Daily 03/29/14 1056     03/25/14 0800  DAPTOmycin (CUBICIN) 630 mg in sodium chloride 0.9 % IVPB    Comments:  ~8 mg/kg   630 mg 225.2 mL/hr over 30 Minutes Intravenous Every 24 hours 03/24/14 1337     03/24/14 1400  micafungin (MYCAMINE) 150 mg in sodium chloride 0.9 % 100 mL IVPB     150 mg 100 mL/hr over 1 Hours Intravenous Daily 03/24/14 1326     03/24/14 1000  azithromycin (ZITHROMAX) tablet 1,200 mg     1,200 mg Oral Every Sat 03/24/14 0113     03/24/14 1000  ceFEPIme (MAXIPIME) 2 g in dextrose 5 % 50 mL IVPB  Status:  Discontinued     2 g 100 mL/hr over 30 Minutes Intravenous Every 12 hours 03/24/14 0113 03/24/14 1326   03/24/14 1000  fluconazole (DIFLUCAN) tablet 400 mg  Status:  Discontinued     400 mg Oral Daily 03/24/14 0113 03/24/14 1326   03/24/14 0800  Darunavir Ethanolate (PREZISTA) tablet 800 mg     800 mg Oral Daily with breakfast 03/24/14 0113     03/24/14  0800  ritonavir (NORVIR) capsule 100 mg     100 mg Oral Daily with breakfast 03/24/14 0113     03/24/14 0145  DAPTOmycin (CUBICIN) 630 mg in sodium chloride 0.9 % IVPB    Comments:  ~8 mg/kg   630 mg 225.2 mL/hr over 30 Minutes Intravenous  Once 03/24/14 0140 03/24/14 0344   03/24/14 0112  atovaquone (MEPRON) 750 MG/5ML suspension 750 mg     750 mg Oral Daily 03/24/14 0113     03/24/14 0112  emtricitabine-tenofovir (TRUVADA) 200-300 MG per tablet 1 tablet     1 tablet Oral Daily at bedtime 03/24/14 0113         PHYSICAL EXAM: Vital signs in last 24 hours: Filed Vitals:   04/05/14 2223 04/06/14 0631 04/06/14 1352 04/07/14 0628  BP: 116/64 121/64 114/61 130/69  Pulse: 94 94 97 93  Temp: 98.4 F (36.9 C) 98.5 F (36.9 C) 98.4 F (36.9 C) 98.4 F (36.9 C)  TempSrc:  Oral Oral Oral Oral  Resp: 18 19 18 18   Height:      Weight:      SpO2: 98% 94% 96% 96%    Weight change:  Filed Weights   03/23/14 1241 03/24/14 0057 03/30/14 0743  Weight: 74.844 kg (165 lb) 79.198 kg (174 lb 9.6 oz) 77.9 kg (171 lb 11.8 oz)   Body mass index is 26.12 kg/(m^2).   Gen Exam: Awake and alert with clear speech.  Appears pale.  Exam essentially unchanged. Neck: Supple, No JVD.   Chest: B/L Clear.  No rales  CVS: S1 S2 Regular, no murmurs.  Abdomen: soft, BS +, non tender, non distended.  Extremities: no edema, lower extremities warm to touch. Neurologic: Non Focal. Good strength bilateral lower ext. Skin: No Rash.     Intake/Output from previous day:  Intake/Output Summary (Last 24 hours) at 04/07/14 1135 Last data filed at 04/07/14 0953  Gross per 24 hour  Intake    540 ml  Output    750 ml  Net   -210 ml     LAB RESULTS: CBC  Recent Labs Lab 04/05/14 0542  WBC 1.8*  HGB 8.2*  HCT 24.6*  PLT 96*  MCV 98.4  MCH 32.8  MCHC 33.3  RDW 16.0*    Chemistries   Recent Labs Lab 04/05/14 0542  NA 134*  K 4.1  CL 102  CO2 24  GLUCOSE 65*  BUN 4*  CREATININE 0.47*    CALCIUM 8.7     RADIOLOGY STUDIES/RESULTS: Dg Chest 2 View  03/14/2014   ADDENDUM REPORT: 03/14/2014 16:56  ADDENDUM: I returned the patient's phone call at 16 50 on 03/14/2014. As I discussed with him, mild compression of the T11 vertebral body appears stable. There is also mild compression of the T10 vertebral body. To some degree this was present on CT scan performed 10/22/2013, but not readily evident on 01/05/2014 radiograph. I believe there has been mild interval progression in the degree of T10 compression deformity, although still mild. As we discussed, an MRI could be considered if felt indicated to determine whether this may be a pathologic process related to metastasis. Also, we discussed the fact that no information regarding the left chest wall nodule was obtained on chest radiograph, as it was not visualized. Further characterization with CT or MRI could be considered. The patient indicated that he would confer with his referring physician.   Electronically Signed   By: Skipper Cliche M.D.   On: 03/14/2014 16:56   03/14/2014   CLINICAL DATA:  Initial evaluation for heart nodule medial aspect of left breast or chest wall since Sunday, personal history of stage IV lymphoma and HIV  EXAM: CHEST  2 VIEW  COMPARISON:  01/05/2014  FINDINGS: Right PICC line in unchanged position.  Limited inspiratory effect.  Heart size upper normal but stable. Vascular pattern normal. Lungs clear. Bony thorax intact.  Stable mild T11 compression deformity. New mild T10 compression deformity.  IMPRESSION: No acute cardiopulmonary process. Would not necessarily expect for a soft tissue nodule to be visualized on chest radiograph. Consider further evaluation with ultrasound or CT scan for soft tissue nodule.  There appears to be new mild T10 compression deformity. This could be further evaluated with MRI of the spine. These results will be called to the ordering clinician or representative by the Radiologist  Assistant, and communication documented in the PACS or zVision Dashboard.  Electronically Signed: By: Skipper Cliche M.D. On: 03/13/2014 16:46  Mr Lumbar Spine Wo Contrast  03/23/2014   CLINICAL DATA:  Multilevel infectious discitis. HIV infection. Lymphoma. Left flank pain of 2 days duration.  EXAM: MRI LUMBAR SPINE WITHOUT CONTRAST  TECHNIQUE: Multiplanar, multisequence MR imaging of the lumbar spine was performed. No intravenous contrast was administered.  COMPARISON:  CT scan same day.  Previous MRI 01/26/2014  FINDINGS: The scan covers the region from lower T9 through S3.  T9-10 does not appear involved.  T10-11: Discitis osteomyelitis at this level with fluid intensity material in the disc space and marrow edema throughout the T10 and T11 vertebral bodies. Slight loss of height at the endplates. No evidence of epidural abscess. No cord compression.  T11-12: No evidence of involvement at this level.  T12-L1 L1-2:  Normal.  L2-3: New abnormality on the left with a superior endplate Schmorl's node type pattern at L3. This could be evidence of early disc space infection at this level or this could be an actual an related Schmorl's node.  L3-4: Persistent evidence of discitis osteomyelitis at this level with edema in the disc and vertebral bodies but no evidence of progression. No spinal abscess.  L4-5:  No evidence of involvement of this disc space.  L5-S1: Worsened pattern with fluid throughout the disc space and worsened edema within the L5 and S1 vertebral bodies. There appears to be either a left posterior lateral disc herniation with caudal migration or some epidural abscess on the left behind S1. The thecal sac is not compressed. There would be potential to affect the S1 nerve roots.  IMPRESSION: Newly seen discitis osteomyelitis at T10-11. Fluid in the disc space. Early collapse of the endplates. No spinal abscess or neural compression.  Newly seen abnormality on the left at L2-3 which looks like a  superior endplate Schmorl's node but could be evidence of early infection developing at the L2-3 disc level.  Persistent osteomyelitis discitis at L3-4, similar to the previous study. No abscess or neural compression.  Marked worsening of the appearance at L5-S1 with fluid throughout the disc space, edema within the L5 and S1 vertebral bodies, an abnormal material in the ventral epidural space that could be extruded disc material or ventral epidural abscess. The central canal is not compressed but there would be potential to affect the S1 nerve roots.   Electronically Signed   By: Nelson Chimes M.D.   On: 03/23/2014 20:21   Ct Renal Stone Study  03/23/2014   CLINICAL DATA:  Left flank pain.  HIV, cirrhosis, lymphoma  EXAM: CT ABDOMEN AND PELVIS WITHOUT CONTRAST  TECHNIQUE: Multidetector CT imaging of the abdomen and pelvis was performed following the standard protocol without IV contrast.  COMPARISON:  CT 11/10/2013  FINDINGS: On today's study, neither oral nor IV contrast was administered limiting evaluation of the bowel.  Mild right lower lobe atelectasis with elevated right hemidiaphragm. Left lung base is clear.  Advanced cirrhotic liver. Prior cholecystectomy. Marked splenomegaly unchanged. Spleen measures 17 x 14 x 18 cm. Periportal adenopathy seen on the prior study difficult to evaluate on today's study due to lack of oral and IV contrast. Probably no change.  The kidneys are normal without obstruction or mass. No renal calculi. Urinary bladder normal.  Severe constipation with large amount of stool in the colon. No bowel obstruction. Previously noted thickening of the right colon not identified on today's study. This may have been infectious colitis which has resolved  No free fluid.  No pathologic adenopathy.  Progressive discitis at T10-11 with  further bone loss and kyphosis. Progressive discitis and osteomyelitis at L3-4, and L5-S1 with sclerotic changes and endplate erosion. Epidural abscess not  evaluated on unenhanced CT.  IMPRESSION: No renal calculi.  Advanced cirrhosis. Marked splenomegaly is unchanged. No free fluid.  Marked constipation.  Progressive discitis and osteomyelitis at T10-11, L3-4, L5-S1.   Electronically Signed   By: Franchot Gallo M.D.   On: 03/23/2014 15:34    Dawanna Grauberger A, MD Pager: [phone]  If 7PM-7AM, please contact night-coverage [email or web address] Password [email or web address] 04/07/2014, 11:35 AM   LOS: 15 days

## 2014-04-08 DIAGNOSIS — M4645 Discitis, unspecified, thoracolumbar region: Secondary | ICD-10-CM

## 2014-04-08 DIAGNOSIS — M4626 Osteomyelitis of vertebra, lumbar region: Secondary | ICD-10-CM

## 2014-04-08 DIAGNOSIS — M519 Unspecified thoracic, thoracolumbar and lumbosacral intervertebral disc disorder: Secondary | ICD-10-CM

## 2014-04-08 DIAGNOSIS — M4640 Discitis, unspecified, site unspecified: Secondary | ICD-10-CM

## 2014-04-08 LAB — CK: CK TOTAL: 223 U/L (ref 7–232)

## 2014-04-08 NOTE — Progress Notes (Signed)
PATIENT DETAILS Name: Samuel Schultz Age: 38 y.o. 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 Patient admitted to [facility], from [facility] rehabilitation facility, patient was recently in the hospital for treatment of L3-L4 discitis and abscess at the L5-S1 level. Patient was discharged on cefepime, daptomycin and fluconazole. In the nursing facility patient was complaining about worsening back pain, brought back to the ED and repeat MRI of the back showed worsening of L3-4 discitis and new discitis at the level of T10-11. Patient admitted to the hospital for further evaluation, started on daptomycin and micafungin. Patient did very well and pain improved and was able to get up and walk around with help. Patient waiting for SNF bed, MRI to be done in one to 2 weeks to evaluate progression of the treatment.  Subjective: No changes clinically.  Assessment/Plan:   Principal Problem: Acute discitis:Recent h/o L3-4 discitis and L5-S1 abscess, came in with new discitis at T10-11, worsening L5-S1 abscess, and persistent L3-4 discitis. Patient discharged on cefepime, daptomycin and fluconazole. Seen by ID, placed on Micafungin and daptomycin 8 mg/kg. Per ID: continue above ABX for at least 6 more weeks (from 03/30/2014 ID note) with close follow up. Will have repeat MRI of spine in 3 weeks (approx 04/23/2014). Weekly CK and chemistries to be ordered while pt is on the ABXs. Stable, awaiting SNF bed.  Nausea with intermittent vomiting: resolved.   Suspect releated to narcotics, having BM's.Trial of scheduled Zofran for 1 day from 10/27. If symptoms persists, will check xray.  On 10/28 doing well.  Tolerating soft diet.  Back Pain:  Secondary to diskitis with a component of  anxiety.  Continue with long acting MS Contin to 75 mg q 12 and short acting MSIR to 30 mg.  On Klonopin QHS.Pain is overall better. Back brace added for support. Patient requested his MS Contin to be dropped to 60 as he his pain is improving. 10/31.  AIDS/HIV infection: Absolute CD4 count is 40 on 01/27/14. Patient is on Truvada, Prezista and Norvir. Azithromycin for prophylaxis. Infectious Disease recommendations are being followed.  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 Radiological evidence of cirrhosis and portal hypertension, with massive splenomegaly and esophageal varices. INR is 1.86. Normal LFTs. Hep C antibody is negative.   Constipation: On bowel regimen with Senna and Miralax.  Pancytopenia: Likely multifactorial from bone marrow suppression from AIDS/cirrhosis and hypersplenism. Labs show stable leukopenia, anemia and thrombocytopenia.   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 Was treated with chemotherapy in 2009. Bone marrow biopsy done on 11/02/2013 in the [facility], showed no evidence of recurrent disease.    Disposition: Remain inpatient-awaiting SNF bed  Antibiotics: Micafungin 10/17>> 05/11/2014 Daptomycin continued since discharge last time >> 05/11/2014  DVT Prophylaxis: Prophylactic Lovenox  Code Status: Full code   Family Communication None.  Patient is alert and orientated.  Procedures:  None  CONSULTS:  ID  MEDICATIONS: Scheduled Meds: . atovaquone  750 mg Oral Daily  . azithromycin  1,200 mg Oral Q Sat  . buPROPion  300 mg Oral Daily  . clonazePAM  1 mg Oral QHS  . DAPTOmycin (CUBICIN)  IV  630 mg Intravenous Q24H  . Darunavir Ethanolate  800 mg Oral Q breakfast  . emtricitabine-tenofovir  1 tablet Oral QHS  . enoxaparin (LOVENOX) injection  40 mg Subcutaneous Q24H  .  feeding supplement (ENSURE COMPLETE)  237 mL Oral BID BM  . feeding supplement (ENSURE)  1 Container Oral TID BM  . gabapentin  400 mg Oral TID  . micafungin [facility]) IV  150 mg Intravenous Daily  . morphine  60 mg Oral Q12H  . ondansetron  4 mg Oral TID AC  . polyethylene glycol  17 g Oral Daily  . ritonavir  100 mg Oral Q breakfast  . senna-docusate  2 tablet Oral Daily  . sucralfate  1 g Oral TID WC   Continuous Infusions: . sodium chloride 10 mL/hr (04/06/14 2117)   PRN Meds:.bisacodyl, methocarbamol, morphine, ondansetron (ZOFRAN) IV, sodium chloride, sodium phosphate, temazepam  Antibiotics: Anti-infectives    Start     Dose/Rate Route Frequency Ordered Stop   03/29/14 0000  DAPTOmycin 630 mg in sodium chloride 0.9 % 100 mL     630 mg225.2 mL/hr over 30 Minutes Intravenous Every 24 hours 03/29/14 1056     03/29/14 0000  micafungin 150 mg in sodium chloride 0.9 % 100 mL     150 mg100 mL/hr over 1 Hours Intravenous Daily 03/29/14 1056     03/25/14 0800  DAPTOmycin (CUBICIN) 630 mg in sodium chloride 0.9 % IVPB    Comments:  ~8 mg/kg   630 mg225.2 mL/hr over 30 Minutes Intravenous Every 24 hours 03/24/14 1337     03/24/14 1400  micafungin (MYCAMINE) 150 mg in sodium chloride 0.9 % 100 mL IVPB     150 mg100 mL/hr over 1 Hours Intravenous Daily 03/24/14 1326     03/24/14 1000  azithromycin (ZITHROMAX) tablet 1,200 mg     1,200 mg Oral Every Sat 03/24/14 0113     03/24/14 1000  ceFEPIme (MAXIPIME) 2 g in dextrose 5 % 50 mL IVPB  Status:  Discontinued     2 g100 mL/hr over 30 Minutes Intravenous Every 12 hours 03/24/14 0113 03/24/14 1326   03/24/14 1000  fluconazole (DIFLUCAN) tablet 400 mg  Status:  Discontinued     400 mg Oral Daily 03/24/14 0113 03/24/14 1326   03/24/14 0800  Darunavir Ethanolate (PREZISTA) tablet 800 mg     800 mg Oral Daily with breakfast 03/24/14 0113     03/24/14 0800  ritonavir (NORVIR) capsule 100 mg     100 mg Oral Daily with breakfast 03/24/14 0113       03/24/14 0145  DAPTOmycin (CUBICIN) 630 mg in sodium chloride 0.9 % IVPB    Comments:  ~8 mg/kg   630 mg225.2 mL/hr over 30 Minutes Intravenous  Once 03/24/14 0140 03/24/14 0344   03/24/14 0112  atovaquone (MEPRON) 750 MG/5ML suspension 750 mg     750 mg Oral Daily 03/24/14 0113     03/24/14 0112  emtricitabine-tenofovir (TRUVADA) 200-300 MG per tablet 1 tablet     1 tablet Oral Daily at bedtime 03/24/14 0113         PHYSICAL EXAM: Vital signs in last 24 hours: Filed Vitals:   04/07/14 0628 04/07/14 1430 04/07/14 2204 04/08/14 0634  BP: 130/69 109/60 114/64 124/60  Pulse: 93 97 97 89  Temp: 98.4 F (36.9 C) 98.8 F (37.1 C) 98.5 F (36.9 C) 98.4 F (36.9 C)  TempSrc: Oral Oral Oral Oral  Resp: _0 Height:  Weight:      SpO2: 96% 98% 97% 95%    Weight change:  Filed Weights   03/23/14 1241 03/24/14 0057 03/30/14 0743  Weight: 74.844 kg (165 lb) 79.198 kg (174 lb 9.6 oz) 77.9 kg (171 lb 11.8 oz)   Body mass index is 26.12 kg/(m^2).   Gen Exam: Awake and alert with clear speech.  Appears pale.  Exam essentially unchanged. Neck: Supple, No JVD.   Chest: B/L Clear.  No rales  CVS: S1 S2 Regular, no murmurs.  Abdomen: soft, BS +, non tender, non distended.  Extremities: no edema, lower extremities warm to touch. Neurologic: Non Focal. Good strength bilateral lower ext. Skin: No Rash.     Intake/Output from previous day:  Intake/Output Summary (Last 24 hours) at 04/08/14 1224 Last data filed at 04/08/14 1100  Gross per 24 hour  Intake   1120 ml  Output   1275 ml  Net   -155 ml     LAB RESULTS: CBC  Recent Labs Lab 04/05/14 0542  WBC 1.8*  HGB 8.2*  HCT 24.6*  PLT 96*  MCV 98.4  MCH 32.8  MCHC 33.3  RDW 16.0*    Chemistries   Recent Labs Lab 04/05/14 0542  NA 134*  K 4.1  CL 102  CO2 24  GLUCOSE 65*  BUN 4*  CREATININE 0.47*  CALCIUM 8.7     RADIOLOGY STUDIES/RESULTS: Dg Chest 2 View  03/14/2014   ADDENDUM REPORT:  03/14/2014 16:56  ADDENDUM: I returned the patient's phone call at 16 50 on 03/14/2014. As I discussed with him, mild compression of the T11 vertebral body appears stable. There is also mild compression of the T10 vertebral body. To some degree this was present on CT scan performed 10/22/2013, but not readily evident on 01/05/2014 radiograph. I believe there has been mild interval progression in the degree of T10 compression deformity, although still mild. As we discussed, an MRI could be considered if felt indicated to determine whether this may be a pathologic process related to metastasis. Also, we discussed the fact that no information regarding the left chest wall nodule was obtained on chest radiograph, as it was not visualized. Further characterization with CT or MRI could be considered. The patient indicated that he would confer with his referring physician.   Electronically Signed   By: Skipper Cliche M.D.   On: 03/14/2014 16:56   03/14/2014   CLINICAL DATA:  Initial evaluation for heart nodule medial aspect of left breast or chest wall since Sunday, personal history of stage IV lymphoma and HIV  EXAM: CHEST  2 VIEW  COMPARISON:  01/05/2014  FINDINGS: Right PICC line in unchanged position.  Limited inspiratory effect.  Heart size upper normal but stable. Vascular pattern normal. Lungs clear. Bony thorax intact.  Stable mild T11 compression deformity. New mild T10 compression deformity.  IMPRESSION: No acute cardiopulmonary process. Would not necessarily expect for a soft tissue nodule to be visualized on chest radiograph. Consider further evaluation with ultrasound or CT scan for soft tissue nodule.  There appears to be new mild T10 compression deformity. This could be further evaluated with MRI of the spine. These results will be called to the ordering clinician or representative by the Radiologist Assistant, and communication documented in the PACS or zVision Dashboard.  Electronically Signed: By:  Skipper Cliche M.D. On: 03/13/2014 16:46   Mr Lumbar Spine Wo Contrast  03/23/2014   CLINICAL DATA:  Multilevel infectious discitis. HIV infection. Lymphoma. Left  flank pain of 2 days duration.  EXAM: MRI LUMBAR SPINE WITHOUT CONTRAST  TECHNIQUE: Multiplanar, multisequence MR imaging of the lumbar spine was performed. No intravenous contrast was administered.  COMPARISON:  CT scan same day.  Previous MRI 01/26/2014  FINDINGS: The scan covers the region from lower T9 through S3.  T9-10 does not appear involved.  T10-11: Discitis osteomyelitis at this level with fluid intensity material in the disc space and marrow edema throughout the T10 and T11 vertebral bodies. Slight loss of height at the endplates. No evidence of epidural abscess. No cord compression.  T11-12: No evidence of involvement at this level.  T12-L1 L1-2:  Normal.  L2-3: New abnormality on the left with a superior endplate Schmorl's node type pattern at L3. This could be evidence of early disc space infection at this level or this could be an actual an related Schmorl's node.  L3-4: Persistent evidence of discitis osteomyelitis at this level with edema in the disc and vertebral bodies but no evidence of progression. No spinal abscess.  L4-5:  No evidence of involvement of this disc space.  L5-S1: Worsened pattern with fluid throughout the disc space and worsened edema within the L5 and S1 vertebral bodies. There appears to be either a left posterior lateral disc herniation with caudal migration or some epidural abscess on the left behind S1. The thecal sac is not compressed. There would be potential to affect the S1 nerve roots.  IMPRESSION: Newly seen discitis osteomyelitis at T10-11. Fluid in the disc space. Early collapse of the endplates. No spinal abscess or neural compression.  Newly seen abnormality on the left at L2-3 which looks like a superior endplate Schmorl's node but could be evidence of early infection developing at the L2-3 disc  level.  Persistent osteomyelitis discitis at L3-4, similar to the previous study. No abscess or neural compression.  Marked worsening of the appearance at L5-S1 with fluid throughout the disc space, edema within the L5 and S1 vertebral bodies, an abnormal material in the ventral epidural space that could be extruded disc material or ventral epidural abscess. The central canal is not compressed but there would be potential to affect the S1 nerve roots.   Electronically Signed   By: Nelson Chimes M.D.   On: 03/23/2014 20:21   Ct Renal Stone Study  03/23/2014   CLINICAL DATA:  Left flank pain.  HIV, cirrhosis, lymphoma  EXAM: CT ABDOMEN AND PELVIS WITHOUT CONTRAST  TECHNIQUE: Multidetector CT imaging of the abdomen and pelvis was performed following the standard protocol without IV contrast.  COMPARISON:  CT 11/10/2013  FINDINGS: On today's study, neither oral nor IV contrast was administered limiting evaluation of the bowel.  Mild right lower lobe atelectasis with elevated right hemidiaphragm. Left lung base is clear.  Advanced cirrhotic liver. Prior cholecystectomy. Marked splenomegaly unchanged. Spleen measures 17 x 14 x 18 cm. Periportal adenopathy seen on the prior study difficult to evaluate on today's study due to lack of oral and IV contrast. Probably no change.  The kidneys are normal without obstruction or mass. No renal calculi. Urinary bladder normal.  Severe constipation with large amount of stool in the colon. No bowel obstruction. Previously noted thickening of the right colon not identified on today's study. This may have been infectious colitis which has resolved  No free fluid.  No pathologic adenopathy.  Progressive discitis at T10-11 with further bone loss and kyphosis. Progressive discitis and osteomyelitis at L3-4, and L5-S1 with sclerotic changes and endplate erosion.  Epidural abscess not evaluated on unenhanced CT.  IMPRESSION: No renal calculi.  Advanced cirrhosis. Marked splenomegaly is  unchanged. No free fluid.  Marked constipation.  Progressive discitis and osteomyelitis at T10-11, L3-4, L5-S1.   Electronically Signed   By: Franchot Gallo M.D.   On: 03/23/2014 15:34    Jaime Grizzell A, MD Pager: [phone]  If 7PM-7AM, please contact night-coverage [email or web address] Password [email or web address] 04/08/2014, 12:24 PM   LOS: 16 days

## 2014-04-08 NOTE — Progress Notes (Signed)
[place] for Infectious Disease    Date of Admission:  03/23/2014   Total days of antibiotics 14        Day 14 micafungin        Day 14 daptomycin           ID: Samuel Schultz is a 38 y.o. male with HIV/AIDs, hx of lymphoma with sequelae of pancytopenia and cirrhosis being treated for lumbar polymicrobial osteomyelitis, previously on daptomycin, cefepime and fluconazole. Readmitted on 10/16 for left flank pain and mri showing new thoracic T10-11 lesion in addition to L5 area of involvement Principal Problem:   Diskitis Active Problems:   AIDS   Cirrhosis, non-alcoholic   Pancytopenia   Protein-calorie malnutrition, severe   Abscess in epidural space of lumbar spine   Infectious discitis    Subjective: Remains afebrile, but reported to have right lower quadrant pain, fatigue from poor sleep  Medications:  . atovaquone  750 mg Oral Daily  . azithromycin  1,200 mg Oral Q Sat  . buPROPion  300 mg Oral Daily  . clonazePAM  1 mg Oral QHS  . DAPTOmycin (CUBICIN)  IV  630 mg Intravenous Q24H  . Darunavir Ethanolate  800 mg Oral Q breakfast  . emtricitabine-tenofovir  1 tablet Oral QHS  . enoxaparin (LOVENOX) injection  40 mg Subcutaneous Q24H  . feeding supplement (ENSURE COMPLETE)  237 mL Oral BID BM  . feeding supplement (ENSURE)  1 Container Oral TID BM  . gabapentin  400 mg Oral TID  . micafungin [facility]) IV  150 mg Intravenous Daily  . morphine  60 mg Oral Q12H  . ondansetron  4 mg Oral TID AC  . polyethylene glycol  17 g Oral Daily  . ritonavir  100 mg Oral Q breakfast  . senna-docusate  2 tablet Oral Daily  . sucralfate  1 g Oral TID WC    Objective: Vital signs in last 24 hours: Temp:  [98.3 F (36.8 C)-98.5 F (36.9 C)] 98.3 F (36.8 C) (11/01 1402) Pulse Rate:  [89-97] 94 (11/01 1402) Resp:  [18] 18 (11/01 1402) BP: (114-124)/(60-70) 120/70 mmHg (11/01 1402) SpO2:  [95 %-97 %] 97 % (11/01 1402) Physical Exam  Constitutional: He is oriented to  person, place, and time. He appears well-developed and well-nourished. No distress.  HENT:  Mouth/Throat: Oropharynx is clear and moist. No oropharyngeal exudate.  Cardiovascular: Normal rate, regular rhythm and normal heart sounds. Exam reveals no gallop and no friction rub.  No murmur heard.  Pulmonary/Chest: Effort normal and breath sounds normal. No respiratory distress. He has no wheezes.  Abdominal: Soft. Bowel sounds are normal. He exhibits no distension. There is no tenderness.  Lymphadenopathy:  He has no cervical adenopathy.  Neurological: He is alert and oriented to person, place, and time.  Skin: Skin is warm and dry. No rash noted. No erythema.  Psychiatric: He has a normal mood and affect. His behavior is normal.    Lab Results  Component Value Date   WBC 1.8* 04/05/2014   HGB 8.2* 04/05/2014   HCT 24.6* 04/05/2014   MCV 98.4 04/05/2014   PLT 96* 04/05/2014   BMET    Component Value Date/Time   NA 134* 04/05/2014 0542   K 4.1 04/05/2014 0542   CL 102 04/05/2014 0542   CO2 24 04/05/2014 0542   GLUCOSE 65* 04/05/2014 0542   BUN 4* 04/05/2014 0542   CREATININE 0.47* 04/05/2014 0542   CALCIUM 8.7 04/05/2014 0542   GFRNONAA >  90 04/05/2014 0542   GFRAA >90 04/05/2014 0542     Microbiology: none Studies/Results: No results found.   Assessment/Plan: Radiologic worsening of osteomyelitis = continue on micafungin, currently on 2 of 6 wk of therapy. Has been on daptomycin for extended period of time since July. Although vre has not been isolated in cx from spine bx or disc aspirate, it is presumed to be involved since osteomyelitis occurred in setting of recurrent vre bacteremia. For now would recommend to continue. Will check labs nad ck tomorrow  hiv = continue on his salvage regimen. Well controlled  oi proph = continue with dapsone and weekly azithromycin  Decondition = continue to have him work with physical therapy.  Dr. Tommy Medal available for  questions  Baxter Flattery [facility] for Infectious Diseases Cell: [phone] Pager: [phone]  04/08/2014, 9:57 PM

## 2014-04-09 DIAGNOSIS — M4646 Discitis, unspecified, lumbar region: Secondary | ICD-10-CM | POA: Insufficient documentation

## 2014-04-09 DIAGNOSIS — Z2239 Carrier of other specified bacterial diseases: Secondary | ICD-10-CM | POA: Insufficient documentation

## 2014-04-09 DIAGNOSIS — M4626 Osteomyelitis of vertebra, lumbar region: Secondary | ICD-10-CM | POA: Insufficient documentation

## 2014-04-09 DIAGNOSIS — B20 Human immunodeficiency virus [HIV] disease: Secondary | ICD-10-CM | POA: Insufficient documentation

## 2014-04-09 DIAGNOSIS — M869 Osteomyelitis, unspecified: Secondary | ICD-10-CM | POA: Insufficient documentation

## 2014-04-09 DIAGNOSIS — M4642 Discitis, unspecified, cervical region: Secondary | ICD-10-CM

## 2014-04-09 LAB — CBC WITH DIFFERENTIAL/PLATELET
BASOS PCT: 0 % (ref 0–1)
Basophils Absolute: 0 10*3/uL (ref 0.0–0.1)
Eosinophils Absolute: 0.2 10*3/uL (ref 0.0–0.7)
Eosinophils Relative: 8 % — ABNORMAL HIGH (ref 0–5)
HCT: 23.8 % — ABNORMAL LOW (ref 39.0–52.0)
HEMOGLOBIN: 8 g/dL — AB (ref 13.0–17.0)
Lymphocytes Relative: 32 % (ref 12–46)
Lymphs Abs: 0.8 10*3/uL (ref 0.7–4.0)
MCH: 31.9 pg (ref 26.0–34.0)
MCHC: 33.6 g/dL (ref 30.0–36.0)
MCV: 94.8 fL (ref 78.0–100.0)
MONO ABS: 0.3 10*3/uL (ref 0.1–1.0)
MONOS PCT: 11 % (ref 3–12)
NEUTROS ABS: 1.2 10*3/uL — AB (ref 1.7–7.7)
Neutrophils Relative %: 49 % (ref 43–77)
Platelets: 104 10*3/uL — ABNORMAL LOW (ref 150–400)
RBC: 2.51 MIL/uL — ABNORMAL LOW (ref 4.22–5.81)
RDW: 15.6 % — ABNORMAL HIGH (ref 11.5–15.5)
WBC: 2.5 10*3/uL — ABNORMAL LOW (ref 4.0–10.5)

## 2014-04-09 LAB — COMPREHENSIVE METABOLIC PANEL
ALT: 15 U/L (ref 0–53)
ANION GAP: 7 (ref 5–15)
AST: 31 U/L (ref 0–37)
Albumin: 1.9 g/dL — ABNORMAL LOW (ref 3.5–5.2)
Alkaline Phosphatase: 91 U/L (ref 39–117)
BILIRUBIN TOTAL: 1.6 mg/dL — AB (ref 0.3–1.2)
BUN: 3 mg/dL — AB (ref 6–23)
CHLORIDE: 101 meq/L (ref 96–112)
CO2: 23 mEq/L (ref 19–32)
CREATININE: 0.45 mg/dL — AB (ref 0.50–1.35)
Calcium: 8.2 mg/dL — ABNORMAL LOW (ref 8.4–10.5)
GLUCOSE: 70 mg/dL (ref 70–99)
Potassium: 3.9 mEq/L (ref 3.7–5.3)
Sodium: 131 mEq/L — ABNORMAL LOW (ref 137–147)
Total Protein: 7 g/dL (ref 6.0–8.3)

## 2014-04-09 LAB — CK: CK TOTAL: 241 U/L — AB (ref 7–232)

## 2014-04-09 NOTE — Progress Notes (Signed)
[facility]    Total days of antibiotics 15 Day 15 micafungin Day 15 daptomycin    Subjective: No new complaints   Antibiotics:  Anti-infectives    Start     Dose/Rate Route Frequency Ordered Stop   03/29/14 0000  DAPTOmycin 630 mg in sodium chloride 0.9 % 100 mL     630 mg225.2 mL/hr over 30 Minutes Intravenous Every 24 hours 03/29/14 1056     03/29/14 0000  micafungin 150 mg in sodium chloride 0.9 % 100 mL     150 mg100 mL/hr over 1 Hours Intravenous Daily 03/29/14 1056     03/25/14 0800  DAPTOmycin (CUBICIN) 630 mg in sodium chloride 0.9 % IVPB    Comments:  ~8 mg/kg   630 mg225.2 mL/hr over 30 Minutes Intravenous Every 24 hours 03/24/14 1337     03/24/14 1400  micafungin (MYCAMINE) 150 mg in sodium chloride 0.9 % 100 mL IVPB     150 mg100 mL/hr over 1 Hours Intravenous Daily 03/24/14 1326     03/24/14 1000  azithromycin (ZITHROMAX) tablet 1,200 mg     1,200 mg Oral Every Sat 03/24/14 0113     03/24/14 1000  ceFEPIme (MAXIPIME) 2 g in dextrose 5 % 50 mL IVPB  Status:  Discontinued     2 g100 mL/hr over 30 Minutes Intravenous Every 12 hours 03/24/14 0113 03/24/14 1326   03/24/14 1000  fluconazole (DIFLUCAN) tablet 400 mg  Status:  Discontinued     400 mg Oral Daily 03/24/14 0113 03/24/14 1326   03/24/14 0800  Darunavir Ethanolate (PREZISTA) tablet 800 mg     800 mg Oral Daily with breakfast 03/24/14 0113     03/24/14 0800  ritonavir (NORVIR) capsule 100 mg     100 mg Oral Daily with breakfast 03/24/14 0113     03/24/14 0145  DAPTOmycin (CUBICIN) 630 mg in sodium chloride 0.9 % IVPB    Comments:  ~8 mg/kg   630 mg225.2 mL/hr over 30 Minutes Intravenous  Once 03/24/14 0140 03/24/14 0344   03/24/14 0112  atovaquone (MEPRON) 750 MG/5ML suspension 750 mg     750 mg Oral Daily 03/24/14 0113     03/24/14 0112  emtricitabine-tenofovir (TRUVADA) 200-300 MG per tablet 1  tablet     1 tablet Oral Daily at bedtime 03/24/14 0113        Medications: Scheduled Meds: . atovaquone  750 mg Oral Daily  . azithromycin  1,200 mg Oral Q Sat  . buPROPion  300 mg Oral Daily  . clonazePAM  1 mg Oral QHS  . DAPTOmycin (CUBICIN)  IV  630 mg Intravenous Q24H  . Darunavir Ethanolate  800 mg Oral Q breakfast  . emtricitabine-tenofovir  1 tablet Oral QHS  . enoxaparin (LOVENOX) injection  40 mg Subcutaneous Q24H  . feeding supplement (ENSURE COMPLETE)  237 mL Oral BID BM  . feeding supplement (ENSURE)  1 Container Oral TID BM  . gabapentin  400 mg Oral TID  . micafungin [facility]) IV  150 mg Intravenous Daily  . morphine  60 mg Oral Q12H  . ondansetron  4 mg Oral TID AC  . polyethylene glycol  17 g Oral Daily  . ritonavir  100 mg Oral Q breakfast  . senna-docusate  2 tablet Oral Daily  . sucralfate  1 g Oral TID WC   Continuous Infusions: . sodium chloride 10 mL/hr (04/06/14 2117)   PRN Meds:.bisacodyl, methocarbamol, morphine, ondansetron (ZOFRAN) IV, sodium chloride,  sodium phosphate, temazepam    Objective: Weight change:   Intake/Output Summary (Last 24 hours) at 04/09/14 1421 Last data filed at 04/09/14 0900  Gross per 24 hour  Intake    908 ml  Output    900 ml  Net      8 ml   Blood pressure 118/63, pulse 91, temperature 98.2 F (36.8 C), temperature source Oral, resp. rate 20, height 5\' 8"  (1.727 m), weight 171 lb 11.8 oz (77.9 kg), SpO2 96 %. Temp:  [98.2 F (36.8 C)-98.9 F (37.2 C)] 98.2 F (36.8 C) (11/02 1415) Pulse Rate:  [91-93] 91 (11/02 1415) Resp:  [18-20] 20 (11/02 1415) BP: (105-118)/(60-63) 118/63 mmHg (11/02 1415) SpO2:  [93 %-96 %] 96 % (11/02 1415)  Physical Exam: General: Alert and awake, oriented x3, not in any acute distress. Sitting in chair with brace Neuro: nonfocal  CBC:  CBC Latest Ref Rng 04/09/2014 04/05/2014 03/30/2014  WBC 4.0 - 10.5 K/uL 2.5(L) 1.8(L) 2.2(L)  Hemoglobin 13.0 - 17.0 g/dL 8.0(L) 8.2(L)  8.5(L)  Hematocrit 39.0 - 52.0 % 23.8(L) 24.6(L) 24.8(L)  Platelets 150 - 400 K/uL 104(L) 96(L) 91(L)      BMET  Recent Labs  04/09/14 0500  NA 131*  K 3.9  CL 101  CO2 23  GLUCOSE 70  BUN 3*  CREATININE 0.45*  CALCIUM 8.2*     Liver Panel   Recent Labs  04/09/14 0500  PROT 7.0  ALBUMIN 1.9*  AST 31  ALT 15  ALKPHOS 91  BILITOT 1.6*       Sedimentation Rate No results for input(s): ESRSEDRATE in the last 72 hours. C-Reactive Protein No results for input(s): CRP in the last 72 hours.  Micro Results: Recent Results (from the past 720 hour(s))  Surgical pcr screen     Status: None   Collection Time: 03/24/14  1:51 AM  Result Value Ref Range Status   MRSA, PCR NEGATIVE NEGATIVE Final   Staphylococcus aureus NEGATIVE NEGATIVE Final    Comment:        The Xpert SA Assay (FDA approved for NASAL specimens in patients over 55 years of age), is one component of a comprehensive surveillance program.  Test performance has been validated by [employer] for patients greater than or equal to 56 year old. It is not intended to diagnose infection nor to guide or monitor treatment.    Studies/Results: No results found.    Assessment/Plan:  Principal Problem:   Diskitis Active Problems:   AIDS   Cirrhosis, non-alcoholic   Pancytopenia   Protein-calorie malnutrition, severe   Abscess in epidural space of lumbar spine   Infectious discitis    Samuel Schultz is a 38 y.o. male with  HIV, AIDS, lymphoma with pancytopenia, cirrhosis who has had episodes of AMP sensitive enterococcal bacteremial, VRE , Lumbar diskitis with negative aspirate in July 6th, then Candida Albicans isolated on aspirate from 01/31/14 who has received ALT will rounds of antibiotics including most recently daptomycin and fluconazole now readmitted with worsening of his disease and the L5-S1 space with collapse of endplates and worsening of discitis with new discitis now the thoracic  spine at T10 and T11. Dr. Linus Salmons broadened anti-fungals to mycafungin which he has been on with daptomycin  #1 Thoracic and Lumbar Diskitis: persistent infection in L spine and new infection in T spine DESPITE MULTIPLE ROUNDS OF IV ANTIBIOTICS INCLUDING ANTIFUNGALS  --I would like to discuss with Neurosurgery as I feel that this patient has been  failing aggressive medical therapy, furthermore FUNGAL DISKITIS, Osteomyelitis can be very difficult to treat absent surgical intervention. I will call Neurosurgery today to discuss  #2 HIV: doing perfectly with suppression and undetectable viral load   LOS: 17 days   Samuel Schultz 04/09/2014, 2:21 PM

## 2014-04-09 NOTE — Progress Notes (Signed)
Physical Therapy Treatment Patient Details Name: Samuel Schultz MRN: [id number] DOB: 02-16-1976 Today's Date: 04/09/2014    History of Present Illness Pt is a 38 y.o. male with history of HIV/AIDS, lymphoma, nonalcoholic cirrhosis, coagulopathy, pancytopenia presented to the ER because of worsening back pain. Patient states that he has been having pain in his low back last few days it has been radiating to his right leg. Denies any fever chills. Patient was recently admitted for discitis and is on IV daptomycin and ceftriaxone. MRI L-spine shows epidural abscess L5-S1 area. On-call neurosurgeon Dr. Saintclair Halsted was consulted and Dr. Saintclair Halsted at this time feels patient does not have any cauda equina features.    PT Comments    Pt deferred exercise, but encouraged to ambulate further than usual.  Reinforced back care.  Pt doffed brace.  Follow Up Recommendations  SNF;Supervision/Assistance - 24 hour     Equipment Recommendations  None recommended by PT    Recommendations for Other Services       Precautions / Restrictions Precautions Precautions: Fall Precaution Comments: educated on back precautions for safety and decreased pain    Mobility  Bed Mobility Overal bed mobility: Needs Assistance Bed Mobility: Sit to Sidelying         Sit to sidelying: Modified independent (Device/Increase time) General bed mobility comments: cues for best technique  Transfers Overall transfer level: Needs assistance Equipment used: Rolling walker (2 wheeled) Transfers: Sit to/from Stand Sit to Stand: Supervision Stand pivot transfers: Min guard       General transfer comment: safe technique  Ambulation/Gait Ambulation/Gait assistance: Min guard Ambulation Distance (Feet): 280 Feet Assistive device: Rolling walker (2 wheeled) Gait Pattern/deviations: Step-through pattern Gait velocity: slow   General Gait Details: steady, but follows the RW too close.   Stairs            Wheelchair  Mobility    Modified Rankin (Stroke Patients Only)       Balance Overall balance assessment: Needs assistance   Sitting balance-Leahy Scale: Fair       Standing balance-Leahy Scale: Fair                      Cognition Arousal/Alertness: Awake/alert Behavior During Therapy: WFL for tasks assessed/performed Overall Cognitive Status: Within Functional Limits for tasks assessed         Following Commands: Follows one step commands consistently Safety/Judgement: Decreased awareness of safety          Exercises      General Comments        Pertinent Vitals/Pain Pain Assessment: 0-10 Pain Score: 8  Pain Descriptors / Indicators: Aching;Sharp Pain Intervention(s): Premedicated before session;Monitored during session    Home Living                      Prior Function            PT Goals (current goals can now be found in the care plan section) Acute Rehab PT Goals PT Goal Formulation: With patient Time For Goal Achievement: 04/09/14 Potential to Achieve Goals: Fair Progress towards PT goals: Progressing toward goals    Frequency  Min 2X/week    PT Plan Current plan remains appropriate    Co-evaluation             End of Session   Activity Tolerance: Patient tolerated treatment well Patient left: in bed;with call bell/phone within reach     Time: 1252-1317 PT Time Calculation (min):  25 min  Charges:  $Gait Training: 8-22 mins $Therapeutic Activity: 8-22 mins                    G Codes:      Alyanah Elliott, Tessie Fass 04/09/2014, 1:36 PM  04/09/2014  Donnella Sham, PT [phone]  (pager)

## 2014-04-09 NOTE — Progress Notes (Signed)
NUTRITION FOLLOW UP  Pt meets criteria for severe MALNUTRITION in the context of chronic illness as evidenced by PO intake <75% for > one month, 18% body weight loss in 6 month, moderate muscle wasting and subcutaneous fat loss .  Intervention:   -Continue with Ensure Complete po BID, each supplement provides 350 kcal and 13 grams of protein  Nutrition Dx:   Unintentional wt loss related to chronic disease as evidenced by >30 lbs weight loss in 6 months; progressing  Goal:   Pt to meet >/= 90% of their estimated nutrition needs  Monitor:   Total protein/energy intake, labs, weights  Assessment:   38 year old male who has a past medical history of HIV disease; Cirrhosis; PTSD (post-traumatic stress disorder); Depressive disorder; Cancer; Lymphoma; and Anemia. 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 At that time he had MRI done for the back pain which was negative but then which progress to discitis and vertebral osteomyelitis. At that time patient was not found to be a good operative candidate and was discharged to a nursing center with 8 weeks of Cubicin and cefepime, with fluconazole. Patient has been getting IV cefepime at this time, and currently residing at the [facility].  Today patient came to the hospital with chief complaint of left-sided flank pain  Intake continues to be good; PO: 25-100%. He is consuming Ensure Complete supplements, although noted some refusals- pt accepts approximately 50% of the time. Will continue with supplements due to malnutrition dx, as well as increased needs for HIV.  Discharge disposition is for SNF [facility]) once bed available. CSW following.  Labs reviewed. Na: 131, Calcium: 8.2, BUN/Creat: 3/0.45.  Height: Ht Readings from Last 1 Encounters:  03/24/14 5\' 8"  (1.727 m)    Weight Status:   Wt Readings from Last  1 Encounters:  03/30/14 171 lb 11.8 oz (77.9 kg)   03/30/14  171 lb 11.8 oz (77.9 kg)   03/24/14  174 lb 9.6 oz (79.198 kg)   Re-estimated needs:  Kcal: 2100-2300  Protein: 101-11 grams  Fluid: 2.1-2.3 L  Skin: DTI on sacrum  Diet Order: Diet regular Diet general   Intake/Output Summary (Last 24 hours) at 04/09/14 1115 Last data filed at 04/09/14 0536  Gross per 24 hour  Intake    668 ml  Output   1050 ml  Net   -382 ml    Last BM: 04/08/14   Labs:   Recent Labs Lab 04/05/14 0542 04/09/14 0500  NA 134* 131*  K 4.1 3.9  CL 102 101  CO2 24 23  BUN 4* 3*  CREATININE 0.47* 0.45*  CALCIUM 8.7 8.2*  GLUCOSE 65* 70    CBG (last 3)  No results for input(s): GLUCAP in the last 72 hours.  Scheduled Meds: . atovaquone  750 mg Oral Daily  . azithromycin  1,200 mg Oral Q Sat  . buPROPion  300 mg Oral Daily  . clonazePAM  1 mg Oral QHS  . DAPTOmycin (CUBICIN)  IV  630 mg Intravenous Q24H  . Darunavir Ethanolate  800 mg Oral Q breakfast  . emtricitabine-tenofovir  1 tablet Oral QHS  . enoxaparin (LOVENOX) injection  40 mg Subcutaneous Q24H  . feeding supplement (ENSURE COMPLETE)  237 mL Oral BID BM  . feeding supplement (ENSURE)  1 Container Oral TID BM  . gabapentin  400 mg Oral TID  .  micafungin [facility]) IV  150 mg Intravenous Daily  . morphine  60 mg Oral Q12H  . ondansetron  4 mg Oral TID AC  . polyethylene glycol  17 g Oral Daily  . ritonavir  100 mg Oral Q breakfast  . senna-docusate  2 tablet Oral Daily  . sucralfate  1 g Oral TID WC    Continuous Infusions: . sodium chloride 10 mL/hr (04/06/14 2117)    Katanya Schlie A. Jimmye Norman, RD, LDN Pager: [phone] After hours Pager: [phone]

## 2014-04-09 NOTE — Progress Notes (Signed)
PATIENT DETAILS Name: Samuel Schultz Age: 38 y.o. 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 Patient admitted to [facility], from [facility] rehabilitation facility, patient was recently in the hospital for treatment of L3-L4 discitis and abscess at the L5-S1 level. Patient was discharged on cefepime, daptomycin and fluconazole. In the nursing facility patient was complaining about worsening back pain, brought back to the ED and repeat MRI of the back showed worsening of L3-4 discitis and new discitis at the level of T10-11. Patient admitted to the hospital for further evaluation, started on daptomycin and micafungin. Patient did very well and pain improved and was able to get up and walk around with help.  Patient waiting for SNF bed, MRI to be done in one to 2 weeks to evaluate progression of the treatment.  Subjective: Patient reports back / flank pain switches sides.  Today it is on the right side. No further complaints.  Assessment/Plan:   Principal Problem: Acute discitis:  Recent h/o L3-4 discitis and L5-S1 abscess, came in with new discitis at T10-11, worsening L5-S1 abscess, and persistent L3-4 discitis. Patient discharged on cefepime, daptomycin and fluconazole. Seen by ID, placed on Micafungin and daptomycin 8 mg/kg. Per ID: continue above ABX for at least 6 more weeks (from 03/30/2014 ID note) with close follow up. Will have repeat MRI of spine in 3 weeks (approx 04/23/2014). Weekly CK and chemistries to be ordered while pt is on the ABXs.    CK rising to 241.  Will discuss with ID and continue to monitor.  Stable, awaiting SNF bed.  Nausea with intermittent vomiting:   resolved.   Suspect releated to narcotics. Now having BM's.Trial of scheduled  Zofran for 1 day from 10/27.  If symptoms recur, will check xray.  On 11/2 doing well.  Tolerating soft diet.  Back Pain:   Secondary to diskitis with a component of anxiety.   Continue with long acting MS Contin to 75 mg q 12 and short acting MSIR to 30 mg.   On Klonopin QHS.Pain is overall better. Back brace added for support.  Patient requested his MS Contin to be dropped to 60 as he his pain is improving. 10/31.  AIDS/HIV infection:   Absolute CD4 count is 40 on 01/27/14. Patient is on Truvada, Prezista and Norvir. Azithromycin for prophylaxis.   Infectious Disease recommendations are being followed.  Cirrhosis:   Reportedly cirrhosis secondary to previous lymphoma infiltration to the liver and R-CHOP chemotherapy.   Radiological evidence of cirrhosis and portal hypertension, with massive splenomegaly and esophageal varices.   INR is 1.86. Hep C antibody is negative. Bili Rubin rising to 1.6 on 11/2.  Constipation:   Resolved.  On bowel regimen with Senna and Miralax.  Pancytopenia:   Likely multifactorial from bone marrow suppression from AIDS/cirrhosis and hypersplenism.  Labs show stable leukopenia, anemia and thrombocytopenia.   Severe protein calorie malnutrition: Continue with diet supplements after discharge   History large B-cell lymphoma 2009:   Metastasis to the back. Was treated with chemotherapy in 2009.   Bone marrow biopsy done on 11/02/2013 in the [facility], showed no evidence of recurrent disease.    Disposition: Remain inpatient-awaiting SNF bed  Antibiotics: Micafungin 10/17>> 05/11/2014 Daptomycin continued since discharge last time >> 05/11/2014  DVT  Prophylaxis: Prophylactic Lovenox  Code Status: Full code   Family Communication None.  Patient is alert and orientated.  Procedures:  None  CONSULTS:  ID  MEDICATIONS: Scheduled Meds: . atovaquone  750 mg Oral Daily  . azithromycin  1,200 mg Oral Q Sat  . buPROPion  300 mg Oral  Daily  . clonazePAM  1 mg Oral QHS  . DAPTOmycin (CUBICIN)  IV  630 mg Intravenous Q24H  . Darunavir Ethanolate  800 mg Oral Q breakfast  . emtricitabine-tenofovir  1 tablet Oral QHS  . enoxaparin (LOVENOX) injection  40 mg Subcutaneous Q24H  . feeding supplement (ENSURE COMPLETE)  237 mL Oral BID BM  . feeding supplement (ENSURE)  1 Container Oral TID BM  . gabapentin  400 mg Oral TID  . micafungin [facility]) IV  150 mg Intravenous Daily  . morphine  60 mg Oral Q12H  . ondansetron  4 mg Oral TID AC  . polyethylene glycol  17 g Oral Daily  . ritonavir  100 mg Oral Q breakfast  . senna-docusate  2 tablet Oral Daily  . sucralfate  1 g Oral TID WC   Continuous Infusions: . sodium chloride 10 mL/hr (04/06/14 2117)   PRN Meds:.bisacodyl, methocarbamol, morphine, ondansetron (ZOFRAN) IV, sodium chloride, sodium phosphate, temazepam  Antibiotics: Anti-infectives    Start     Dose/Rate Route Frequency Ordered Stop   03/29/14 0000  DAPTOmycin 630 mg in sodium chloride 0.9 % 100 mL     630 mg225.2 mL/hr over 30 Minutes Intravenous Every 24 hours 03/29/14 1056     03/29/14 0000  micafungin 150 mg in sodium chloride 0.9 % 100 mL     150 mg100 mL/hr over 1 Hours Intravenous Daily 03/29/14 1056     03/25/14 0800  DAPTOmycin (CUBICIN) 630 mg in sodium chloride 0.9 % IVPB    Comments:  ~8 mg/kg   630 mg225.2 mL/hr over 30 Minutes Intravenous Every 24 hours 03/24/14 1337     03/24/14 1400  micafungin (MYCAMINE) 150 mg in sodium chloride 0.9 % 100 mL IVPB     150 mg100 mL/hr over 1 Hours Intravenous Daily 03/24/14 1326     03/24/14 1000  azithromycin (ZITHROMAX) tablet 1,200 mg     1,200 mg Oral Every Sat 03/24/14 0113     03/24/14 1000  ceFEPIme (MAXIPIME) 2 g in dextrose 5 % 50 mL IVPB  Status:  Discontinued     2 g100 mL/hr over 30 Minutes Intravenous Every 12 hours 03/24/14 0113 03/24/14 1326   03/24/14 1000  fluconazole (DIFLUCAN) tablet 400 mg  Status:  Discontinued     400 mg Oral Daily  03/24/14 0113 03/24/14 1326   03/24/14 0800  Darunavir Ethanolate (PREZISTA) tablet 800 mg     800 mg Oral Daily with breakfast 03/24/14 0113     03/24/14 0800  ritonavir (NORVIR) capsule 100 mg     100 mg Oral Daily with breakfast 03/24/14 0113     03/24/14 0145  DAPTOmycin (CUBICIN) 630 mg in sodium chloride 0.9 % IVPB    Comments:  ~8 mg/kg   630 mg225.2 mL/hr over 30 Minutes Intravenous  Once 03/24/14 0140 03/24/14 0344   03/24/14 0112  atovaquone (MEPRON) 750 MG/5ML suspension 750 mg     750 mg Oral Daily 03/24/14 0113     03/24/14 0112  emtricitabine-tenofovir (TRUVADA) 200-300 MG per tablet 1 tablet     1 tablet Oral Daily at bedtime 03/24/14 0113  PHYSICAL EXAM: Vital signs in last 24 hours: Filed Vitals:   04/08/14 1402 04/08/14 2215 04/09/14 0648 04/09/14 1415  BP: 120/70 105/60 114/63 118/63  Pulse: 94 93 91 91  Temp: 98.3 F (36.8 C) 98.9 F (37.2 C) 98.5 F (36.9 C) 98.2 F (36.8 C)  TempSrc: Oral Oral Oral Oral  Resp: 18 18 18 20   Height:      Weight:      SpO2: 97% 93% 94% 96%    Weight change:  Filed Weights   03/23/14 1241 03/24/14 0057 03/30/14 0743  Weight: 74.844 kg (165 lb) 79.198 kg (174 lb 9.6 oz) 77.9 kg (171 lb 11.8 oz)   Body mass index is 26.12 kg/(m^2).   Gen Exam: Awake and alert with clear speech.  Appears pale.   Patient ambulating with walker. Neck: Supple, No JVD.   Chest: B/L Clear.  No rales  CVS: S1 S2 Regular, no murmurs.  Abdomen: soft, BS +, non tender, non distended.  Extremities: no edema, lower extremities warm to touch. Neurologic: Non Focal. Good strength bilateral lower ext. Skin: No Rash.  Per RN - stage 2 pressure ulceration on sacrum.   Intake/Output from previous day:  Intake/Output Summary (Last 24 hours) at 04/09/14 1441 Last data filed at 04/09/14 0900  Gross per 24 hour  Intake    908 ml  Output    900 ml  Net      8 ml     LAB RESULTS: CBC  Recent Labs Lab 04/05/14 0542 04/09/14 0500  WBC  1.8* 2.5*  HGB 8.2* 8.0*  HCT 24.6* 23.8*  PLT 96* 104*  MCV 98.4 94.8  MCH 32.8 31.9  MCHC 33.3 33.6  RDW 16.0* 15.6*  LYMPHSABS  --  0.8  MONOABS  --  0.3  EOSABS  --  0.2  BASOSABS  --  0.0    Chemistries   Recent Labs Lab 04/05/14 0542 04/09/14 0500  NA 134* 131*  K 4.1 3.9  CL 102 101  CO2 24 23  GLUCOSE 65* 70  BUN 4* 3*  CREATININE 0.47* 0.45*  CALCIUM 8.7 8.2*     RADIOLOGY STUDIES/RESULTS: Dg Chest 2 View  03/14/2014   ADDENDUM REPORT: 03/14/2014 16:56  ADDENDUM: I returned the patient's phone call at 16 50 on 03/14/2014. As I discussed with him, mild compression of the T11 vertebral body appears stable. There is also mild compression of the T10 vertebral body. To some degree this was present on CT scan performed 10/22/2013, but not readily evident on 01/05/2014 radiograph. I believe there has been mild interval progression in the degree of T10 compression deformity, although still mild. As we discussed, an MRI could be considered if felt indicated to determine whether this may be a pathologic process related to metastasis. Also, we discussed the fact that no information regarding the left chest wall nodule was obtained on chest radiograph, as it was not visualized. Further characterization with CT or MRI could be considered. The patient indicated that he would confer with his referring physician.   Electronically Signed   By: Skipper Cliche M.D.   On: 03/14/2014 16:56   03/14/2014   CLINICAL DATA:  Initial evaluation for heart nodule medial aspect of left breast or chest wall since Sunday, personal history of stage IV lymphoma and HIV  EXAM: CHEST  2 VIEW  COMPARISON:  01/05/2014  FINDINGS: Right PICC line in unchanged position.  Limited inspiratory effect.  Heart size upper normal but stable. Vascular  pattern normal. Lungs clear. Bony thorax intact.  Stable mild T11 compression deformity. New mild T10 compression deformity.  IMPRESSION: No acute cardiopulmonary process.  Would not necessarily expect for a soft tissue nodule to be visualized on chest radiograph. Consider further evaluation with ultrasound or CT scan for soft tissue nodule.  There appears to be new mild T10 compression deformity. This could be further evaluated with MRI of the spine. These results will be called to the ordering clinician or representative by the Radiologist Assistant, and communication documented in the PACS or zVision Dashboard.  Electronically Signed: By: Skipper Cliche M.D. On: 03/13/2014 16:46   Mr Lumbar Spine Wo Contrast  03/23/2014   CLINICAL DATA:  Multilevel infectious discitis. HIV infection. Lymphoma. Left flank pain of 2 days duration.  EXAM: MRI LUMBAR SPINE WITHOUT CONTRAST  TECHNIQUE: Multiplanar, multisequence MR imaging of the lumbar spine was performed. No intravenous contrast was administered.  COMPARISON:  CT scan same day.  Previous MRI 01/26/2014  FINDINGS: The scan covers the region from lower T9 through S3.  T9-10 does not appear involved.  T10-11: Discitis osteomyelitis at this level with fluid intensity material in the disc space and marrow edema throughout the T10 and T11 vertebral bodies. Slight loss of height at the endplates. No evidence of epidural abscess. No cord compression.  T11-12: No evidence of involvement at this level.  T12-L1 L1-2:  Normal.  L2-3: New abnormality on the left with a superior endplate Schmorl's node type pattern at L3. This could be evidence of early disc space infection at this level or this could be an actual an related Schmorl's node.  L3-4: Persistent evidence of discitis osteomyelitis at this level with edema in the disc and vertebral bodies but no evidence of progression. No spinal abscess.  L4-5:  No evidence of involvement of this disc space.  L5-S1: Worsened pattern with fluid throughout the disc space and worsened edema within the L5 and S1 vertebral bodies. There appears to be either a left posterior lateral disc herniation with  caudal migration or some epidural abscess on the left behind S1. The thecal sac is not compressed. There would be potential to affect the S1 nerve roots.  IMPRESSION: Newly seen discitis osteomyelitis at T10-11. Fluid in the disc space. Early collapse of the endplates. No spinal abscess or neural compression.  Newly seen abnormality on the left at L2-3 which looks like a superior endplate Schmorl's node but could be evidence of early infection developing at the L2-3 disc level.  Persistent osteomyelitis discitis at L3-4, similar to the previous study. No abscess or neural compression.  Marked worsening of the appearance at L5-S1 with fluid throughout the disc space, edema within the L5 and S1 vertebral bodies, an abnormal material in the ventral epidural space that could be extruded disc material or ventral epidural abscess. The central canal is not compressed but there would be potential to affect the S1 nerve roots.   Electronically Signed   By: Nelson Chimes M.D.   On: 03/23/2014 20:21   Ct Renal Stone Study  03/23/2014   CLINICAL DATA:  Left flank pain.  HIV, cirrhosis, lymphoma  EXAM: CT ABDOMEN AND PELVIS WITHOUT CONTRAST  TECHNIQUE: Multidetector CT imaging of the abdomen and pelvis was performed following the standard protocol without IV contrast.  COMPARISON:  CT 11/10/2013  FINDINGS: On today's study, neither oral nor IV contrast was administered limiting evaluation of the bowel.  Mild right lower lobe atelectasis with elevated right hemidiaphragm. Left  lung base is clear.  Advanced cirrhotic liver. Prior cholecystectomy. Marked splenomegaly unchanged. Spleen measures 17 x 14 x 18 cm. Periportal adenopathy seen on the prior study difficult to evaluate on today's study due to lack of oral and IV contrast. Probably no change.  The kidneys are normal without obstruction or mass. No renal calculi. Urinary bladder normal.  Severe constipation with large amount of stool in the colon. No bowel obstruction.  Previously noted thickening of the right colon not identified on today's study. This may have been infectious colitis which has resolved  No free fluid.  No pathologic adenopathy.  Progressive discitis at T10-11 with further bone loss and kyphosis. Progressive discitis and osteomyelitis at L3-4, and L5-S1 with sclerotic changes and endplate erosion. Epidural abscess not evaluated on unenhanced CT.  IMPRESSION: No renal calculi.  Advanced cirrhosis. Marked splenomegaly is unchanged. No free fluid.  Marked constipation.  Progressive discitis and osteomyelitis at T10-11, L3-4, L5-S1.   Electronically Signed   By: Franchot Gallo M.D.   On: 03/23/2014 15:34    Karen Kitchens Pager: [phone]  If 7PM-7AM, please contact night-coverage [email or web address] Password [email or web address] 04/09/2014, 2:41 PM   LOS: 17 days

## 2014-04-09 NOTE — Clinical Social Work Note (Signed)
CSW contacted [facility] in regards to bed availability. Facility states that they do not currently have a bed available for the patient. Joelene Millin, admissions coordinator, states that she will contact CSW directly once a bed becomes available. CSW will continue to follow.   Liz Beach MSW, [place], [place], [phone]

## 2014-04-10 DIAGNOSIS — G061 Intraspinal abscess and granuloma: Secondary | ICD-10-CM

## 2014-04-10 DIAGNOSIS — M4645 Discitis, unspecified, thoracolumbar region: Secondary | ICD-10-CM

## 2014-04-10 DIAGNOSIS — B20 Human immunodeficiency virus [HIV] disease: Secondary | ICD-10-CM

## 2014-04-10 DIAGNOSIS — K7460 Unspecified cirrhosis of liver: Secondary | ICD-10-CM

## 2014-04-10 LAB — CK: Total CK: 127 U/L (ref 7–232)

## 2014-04-10 MED ORDER — ONDANSETRON HCL 4 MG/2ML IJ SOLN: 4.0000 mg | INTRAMUSCULAR | Status: DC | PRN

## 2014-04-10 MED ORDER — MORPHINE SULFATE 2 MG/ML IJ SOLN
1.0000 mg | Freq: Once | INTRAMUSCULAR | Status: AC
  Administered 2014-04-10: 1 mg via INTRAVENOUS
  Filled 2014-04-10: qty 1

## 2014-04-10 MED ORDER — ONDANSETRON HCL 4 MG/2ML IJ SOLN
4.0000 mg | INTRAMUSCULAR | Status: DC | PRN
  Administered 2014-04-13 – 2014-04-18 (×5): 4 mg via INTRAVENOUS
  Filled 2014-04-10 (×5): qty 2

## 2014-04-10 NOTE — Progress Notes (Signed)
[facility]    Total days of antibiotics 16 Day 16 micafungin Day 16 daptomycin    Subjective: No new complaints   Antibiotics:  Anti-infectives    Start     Dose/Rate Route Frequency Ordered Stop   03/29/14 0000  DAPTOmycin 630 mg in sodium chloride 0.9 % 100 mL     630 mg225.2 mL/hr over 30 Minutes Intravenous Every 24 hours 03/29/14 1056     03/29/14 0000  micafungin 150 mg in sodium chloride 0.9 % 100 mL     150 mg100 mL/hr over 1 Hours Intravenous Daily 03/29/14 1056     03/25/14 0800  DAPTOmycin (CUBICIN) 630 mg in sodium chloride 0.9 % IVPB  Status:  Discontinued    Comments:  ~8 mg/kg   630 mg225.2 mL/hr over 30 Minutes Intravenous Every 24 hours 03/24/14 1337 04/10/14 1243   03/24/14 1400  micafungin (MYCAMINE) 150 mg in sodium chloride 0.9 % 100 mL IVPB  Status:  Discontinued     150 mg100 mL/hr over 1 Hours Intravenous Daily 03/24/14 1326 04/10/14 1243   03/24/14 1000  azithromycin (ZITHROMAX) tablet 1,200 mg  Status:  Discontinued     1,200 mg Oral Every Sat 03/24/14 0113 04/10/14 1243   03/24/14 1000  ceFEPIme (MAXIPIME) 2 g in dextrose 5 % 50 mL IVPB  Status:  Discontinued     2 g100 mL/hr over 30 Minutes Intravenous Every 12 hours 03/24/14 0113 03/24/14 1326   03/24/14 1000  fluconazole (DIFLUCAN) tablet 400 mg  Status:  Discontinued     400 mg Oral Daily 03/24/14 0113 03/24/14 1326   03/24/14 0800  Darunavir Ethanolate (PREZISTA) tablet 800 mg     800 mg Oral Daily with breakfast 03/24/14 0113     03/24/14 0800  ritonavir (NORVIR) capsule 100 mg     100 mg Oral Daily with breakfast 03/24/14 0113     03/24/14 0145  DAPTOmycin (CUBICIN) 630 mg in sodium chloride 0.9 % IVPB    Comments:  ~8 mg/kg   630 mg225.2 mL/hr over 30 Minutes Intravenous  Once 03/24/14 0140 03/24/14 0344   03/24/14 0112  atovaquone (MEPRON) 750 MG/5ML suspension 750 mg     750  mg Oral Daily 03/24/14 0113     03/24/14 0112  emtricitabine-tenofovir (TRUVADA) 200-300 MG per tablet 1 tablet     1 tablet Oral Daily at bedtime 03/24/14 0113        Medications: Scheduled Meds: . atovaquone  750 mg Oral Daily  . buPROPion  300 mg Oral Daily  . clonazePAM  1 mg Oral QHS  . Darunavir Ethanolate  800 mg Oral Q breakfast  . emtricitabine-tenofovir  1 tablet Oral QHS  . enoxaparin (LOVENOX) injection  40 mg Subcutaneous Q24H  . feeding supplement (ENSURE COMPLETE)  237 mL Oral BID BM  . feeding supplement (ENSURE)  1 Container Oral TID BM  . gabapentin  400 mg Oral TID  . morphine  60 mg Oral Q12H  . polyethylene glycol  17 g Oral Daily  . ritonavir  100 mg Oral Q breakfast  . senna-docusate  2 tablet Oral Daily  . sucralfate  1 g Oral TID WC   Continuous Infusions: . sodium chloride 10 mL/hr at 04/10/14 0601   PRN Meds:.bisacodyl, methocarbamol, morphine, ondansetron (ZOFRAN) IV, sodium chloride, sodium phosphate, temazepam    Objective: Weight change:   Intake/Output Summary (Last 24 hours) at 04/10/14 1642 Last data filed at 04/10/14 [phone]  Gross per 24 hour  Intake    480 ml  Output    525 ml  Net    -45 ml   Blood pressure 114/52, pulse 95, temperature 98.6 F (37 C), temperature source Oral, resp. rate 20, height 5\' 8"  (1.727 m), weight 171 lb 11.8 oz (77.9 kg), SpO2 96 %. Temp:  [98.3 F (36.8 C)-98.7 F (37.1 C)] 98.6 F (37 C) (11/03 1448) Pulse Rate:  [94-95] 95 (11/03 1448) Resp:  [18-20] 20 (11/03 1448) BP: (109-114)/(52-63) 114/52 mmHg (11/03 1448) SpO2:  [96 %-97 %] 96 % (11/03 1448)  Physical Exam: General: Alert and awake, oriented x3, not in any acute distress. Sitting in chair with brace Neuro: nonfocal  CBC:  CBC Latest Ref Rng 04/09/2014 04/05/2014 03/30/2014  WBC 4.0 - 10.5 K/uL 2.5(L) 1.8(L) 2.2(L)  Hemoglobin 13.0 - 17.0 g/dL 8.0(L) 8.2(L) 8.5(L)  Hematocrit 39.0 - 52.0 % 23.8(L) 24.6(L) 24.8(L)  Platelets 150 - 400  K/uL 104(L) 96(L) 91(L)      BMET  Recent Labs  04/09/14 0500  NA 131*  K 3.9  CL 101  CO2 23  GLUCOSE 70  BUN 3*  CREATININE 0.45*  CALCIUM 8.2*     Liver Panel   Recent Labs  04/09/14 0500  PROT 7.0  ALBUMIN 1.9*  AST 31  ALT 15  ALKPHOS 91  BILITOT 1.6*       Sedimentation Rate No results for input(s): ESRSEDRATE in the last 72 hours. C-Reactive Protein No results for input(s): CRP in the last 72 hours.  Micro Results: Recent Results (from the past 720 hour(s))  Surgical pcr screen     Status: None   Collection Time: 03/24/14  1:51 AM  Result Value Ref Range Status   MRSA, PCR NEGATIVE NEGATIVE Final   Staphylococcus aureus NEGATIVE NEGATIVE Final    Comment:        The Xpert SA Assay (FDA approved for NASAL specimens in patients over 38 years of age), is one component of a comprehensive surveillance program.  Test performance has been validated by [employer] for patients greater than or equal to 38 year old. It is not intended to diagnose infection nor to guide or monitor treatment.    Studies/Results: No results found.    Assessment/Plan:  Principal Problem:   Diskitis Active Problems:   AIDS   Cirrhosis, non-alcoholic   Pancytopenia   Protein-calorie malnutrition, severe   Abscess in epidural space of lumbar spine   Infectious discitis   Discitis of lumbar region   HIV (human immunodeficiency virus infection)   Osteomyelitis of lumbar spine   Fungal osteomyelitis   VRE (vancomycin resistant enterococcus) culture positive    Davison Ohms is a 38 y.o. male with  HIV, AIDS, lymphoma with pancytopenia, cirrhosis who has had episodes of AMP sensitive enterococcal bacteremial, VRE , Lumbar diskitis with negative aspirate in July 6th, then Candida Albicans isolated on aspirate from 01/31/14 who has received ALT will rounds of antibiotics including most recently daptomycin and fluconazole now readmitted with worsening of his  disease and the L5-S1 space with collapse of endplates and worsening of discitis with new discitis now the thoracic spine at T10 and T11. Dr. Linus Salmons broadened anti-fungals to mycafungin which he has been on with daptomycin  #1 Thoracic and Lumbar Diskitis: persistent infection in L spine and new infection in T spine DESPITE MULTIPLE ROUNDS OF IV ANTIBIOTICS INCLUDING ANTIFUNGALS  --I had extensive discussion with Dr. Saintclair Halsted re this pt yesterday afternoon  and today as well. Dr. Saintclair Halsted plans on taking patient to the OR tomorrow vs Thursday for surgery to L spine  --HOPEFULLY THIS WILL HELP CONTROL HIS INFECTION AND  WITH CULTURES SENT FOR   #1 BACTERIAL CULTURS #2 FUNGAL CULTURES #3 AFB CULTURES  GIVE [place] A TARGET FOR ANTIMICROBIAL THERAPY  I AM [facility] HIS DAPTOMICIN AND MICAFUNGIN ALONG WITH AZITHRO TO INCREASE YIELD ON CULTURES  #2 HIV: doing perfectly with suppression and undetectable viral load   LOS: 18 days   Alcide Evener 04/10/2014, 4:42 PM

## 2014-04-10 NOTE — Progress Notes (Signed)
Patient POA called  Deretha Emory and will like to discuss patient surgery prior to it being scheduled. [phone].

## 2014-04-10 NOTE — Progress Notes (Addendum)
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 He was admitted to [facility], from [facility] rehabilitation facility, patient was recently in the hospital for treatment of L3-L4 discitis and abscess at the L5-S1 level. He was discharged on cefepime, daptomycin and fluconazole. In the nursing facility patient complained about worsening back pain, brought back to the ED and repeat MRI of the back showed worsening of L3-4 discitis and new discitis at the level of T10-11. He was admitted to the hospital for further evaluation, started on daptomycin and micafungin per ID. Patient is doing very well and pain has improved. He is able to get up and walk around with help. Patient waiting for SNF bed, MRI to be done in one to 2 weeks to evaluate progression of the treatment.  Assessment/Plan: Acute discitis:  Recent h/o L3-4 discitis and L5-S1 abscess. Came in with new discitis at T10-11, worsening L5-S1 abscess, and persistent L3-4 discitis. Patient discharged on cefepime, daptomycin and fluconazole. Seen by ID here and placed on Micafungin and daptomycin 8 mg/kg. Per ID: continue above ABX for at least 6 more weeks (from 03/30/2014 ID note) with close follow up. Will repeat MRI of spine in 3 weeks (approx 04/23/2014). Weekly CK and chemistries to be ordered while pt is on the ABXs.  CK back within normal limits of 127 on 11/3 Will continue to monitor.  Infectious Disease (Dr. Drucilla Schmidt) is discussing his case with Neuro Surgery for possible definitive treatment.  Nausea with intermittent vomiting:   Resolved.  Suspect releated to narcotics and constipation. Now having BM's on senna.  Was on scheduled Zofran before meals.  Will trial discontinuation of scheduled Zofran and continue PRN.  Tolerating  soft diet well.   Back Pain:  1. Secondary to diskitis with a component of anxiety.  2. Continue with long acting MS Contin to 75 mg q 12 and short acting MSIR to 30 mg.  3. On Klonopin QHS.Pain is overall better 6/10. Back brace that was added for support is helping him become more mobile.  4. Patient requested his MS Contin to be dropped to 60 as he his pain is improving. 10/31.  AIDS/HIV infection:   Absolute CD4 count is 40 on 01/27/14.   Patient is on Truvada, Prezista and Norvir. Azithromycin for prophylaxis.   Infectious Disease is on board.  Cirrhosis:   Reportedly secondary to previous lymphoma infiltration to the liver and R-CHOP chemotherapy.   Radiological evidence of cirrhosis and portal hypertension, with massive splenomegaly and esophageal varices.   INR is 1.86. Hep C antibody is negative. Bili Rubin rising to 1.6 on 11/2.  Constipation:   Resolved.  On bowel regimen with Senna and Miralax.  Patient's last BM was on 11/2  Pancytopenia:   Likely multifactorial from bone marrow suppression from AIDS/cirrhosis and hypersplenism.  Labs show stable leukopenia, anemia and thrombocytopenia.  Will continue to monitor as needed while patient is in hospital.  Severe protein calorie malnutrition: Continue with diet supplements after discharge   History large B-cell lymphoma 2009:   Metastasis to the back. Was treated with chemotherapy in 2009.   Bone marrow biopsy done on 11/02/2013 in the [facility], showed no evidence of recurrent disease.     DVT Prophylaxis:  Lovenox Code Status: Full Family Communication: Patient is AAOx3 and agreeable to plan or care  Disposition Plan: SNF when appropriate.  After neurosurgery re-evaluation.   Consultants:  ID  Neurosurgery.  Procedures:  None  Antibiotics:  Micafungin 10/17>> 05/11/2014  Daptomycin continued since discharge last time >> 05/11/2014  Objective: Filed Vitals:   04/09/14 1415  04/09/14 2142 04/09/14 2144 04/10/14 0708  BP: 118/63 114/60 114/60 109/63  Pulse: 91 94 94 95  Temp: 98.2 F (36.8 C) 98.3 F (36.8 C) 98.3 F (36.8 C) 98.7 F (37.1 C)  TempSrc: Oral Oral Oral Oral  Resp: 20 20 20 18   Height:      Weight:      SpO2: 96% 96% 96% 97%    Intake/Output Summary (Last 24 hours) at 04/10/14 1113 Last data filed at 04/10/14 0711  Gross per 24 hour  Intake    480 ml  Output    525 ml  Net    -45 ml   Filed Weights   03/23/14 1241 03/24/14 0057 03/30/14 0743  Weight: 74.844 kg (165 lb) 79.198 kg (174 lb 9.6 oz) 77.9 kg (171 lb 11.8 oz)    Exam: General: Patient is resting comfortably in bed. NAD, appears younger than stated age.   HEENT:  EOMI, Anicteic Sclera, MMM.  Neck: Supple, no JVD, no masses  Cardiovascular: RRR, S1 S2 auscultated, no rubs, murmurs or gallops.   Respiratory: Clear to auscultation bilaterally with equal chest rise, slightly decreased effort likely due to pain  Abdomen: Soft, nontender, nondistended, + bowel sounds  Extremities: warm dry without cyanosis clubbing or edema.  Skin: Per RN-stage 2 pressure ulceration on sacrum.    Data Reviewed: Basic Metabolic Panel:  Recent Labs Lab 04/05/14 0542 04/09/14 0500  NA 134* 131*  K 4.1 3.9  CL 102 101  CO2 24 23  GLUCOSE 65* 70  BUN 4* 3*  CREATININE 0.47* 0.45*  CALCIUM 8.7 8.2*   Liver Function Tests:  Recent Labs Lab 04/09/14 0500  AST 31  ALT 15  ALKPHOS 91  BILITOT 1.6*  PROT 7.0  ALBUMIN 1.9*   CBC:  Recent Labs Lab 04/05/14 0542 04/09/14 0500  WBC 1.8* 2.5*  NEUTROABS  --  1.2*  HGB 8.2* 8.0*  HCT 24.6* 23.8*  MCV 98.4 94.8  PLT 96* 104*   Cardiac Enzymes:  Recent Labs Lab 04/08/14 0445 04/09/14 0500 04/10/14 0521  CKTOTAL 223 241* 127    Studies: No results found.  Scheduled Meds: . atovaquone  750 mg Oral Daily  . azithromycin  1,200 mg Oral Q Sat  . buPROPion  300 mg Oral Daily  . clonazePAM  1 mg Oral QHS  . DAPTOmycin  (CUBICIN)  IV  630 mg Intravenous Q24H  . Darunavir Ethanolate  800 mg Oral Q breakfast  . emtricitabine-tenofovir  1 tablet Oral QHS  . enoxaparin (LOVENOX) injection  40 mg Subcutaneous Q24H  . feeding supplement (ENSURE COMPLETE)  237 mL Oral BID BM  . feeding supplement (ENSURE)  1 Container Oral TID BM  . gabapentin  400 mg Oral TID  . micafungin [facility]) IV  150 mg Intravenous Daily  . morphine  60 mg Oral Q12H  . ondansetron  4 mg Oral TID AC  . polyethylene glycol  17 g Oral Daily  . ritonavir  100 mg Oral Q breakfast  . senna-docusate  2 tablet Oral Daily  . sucralfate  1 g Oral TID WC   Continuous Infusions: . sodium chloride 10 mL/hr at 04/10/14 0601    Principal Problem:   Diskitis Active Problems:  AIDS   Cirrhosis, non-alcoholic   Pancytopenia   Protein-calorie malnutrition, severe   Abscess in epidural space of lumbar spine   Infectious discitis   Discitis of lumbar region   HIV (human immunodeficiency virus infection)   Osteomyelitis of lumbar spine   Fungal osteomyelitis   VRE (vancomycin resistant enterococcus) culture positive   Colbert Ewing PA-S Imogene Burn, PA-C [facility] Pager [phone]. If 7PM-7AM, please contact night-coverage at [email or web address], password [facility] 04/10/2014, 11:13 AM  LOS: 18 days      Addendum  Patient seen and examined, chart and data base reviewed.  I agree with the above assessment and plan.  For full details please see Mrs. Imogene Burn PA note.   Birdie Hopes, MD [facility] Pager: [phone] 05/02/2014, 11:30 AM

## 2014-04-10 NOTE — Progress Notes (Signed)
Pt expressed extreme frustration that changes in the plan of care are being decided without his knowledge or input - specifically, that the anti-infectives were dc'd. I researched the reasons for this change and explained to the patient, but this made the patient more angry, stating that he was not given all this information ahead of time or consulted before the changes were actually ordered. He expressed concern that without the antiinfectives, his pain and problem would increase. MD paged with pt's concerns.

## 2014-04-11 ENCOUNTER — Inpatient Hospital Stay (HOSPITAL_COMMUNITY): Payer: Medicaid Other

## 2014-04-11 DIAGNOSIS — M25559 Pain in unspecified hip: Secondary | ICD-10-CM

## 2014-04-11 LAB — COMPREHENSIVE METABOLIC PANEL
ALBUMIN: 2 g/dL — AB (ref 3.5–5.2)
ALT: 16 U/L (ref 0–53)
ANION GAP: 9 (ref 5–15)
AST: 29 U/L (ref 0–37)
Alkaline Phosphatase: 96 U/L (ref 39–117)
BUN: 4 mg/dL — ABNORMAL LOW (ref 6–23)
CALCIUM: 8.3 mg/dL — AB (ref 8.4–10.5)
CO2: 22 mEq/L (ref 19–32)
Chloride: 103 mEq/L (ref 96–112)
Creatinine, Ser: 0.45 mg/dL — ABNORMAL LOW (ref 0.50–1.35)
GFR calc Af Amer: >90 mL/min (ref >90)
GFR calc non Af Amer: >90 mL/min (ref >90)
Glucose, Bld: 71 mg/dL (ref 70–99)
Potassium: 3.8 mEq/L (ref 3.7–5.3)
Sodium: 134 mEq/L — ABNORMAL LOW (ref 137–147)
TOTAL PROTEIN: 7.4 g/dL (ref 6.0–8.3)
Total Bilirubin: 1.7 mg/dL — ABNORMAL HIGH (ref 0.3–1.2)

## 2014-04-11 LAB — BASIC METABOLIC PANEL
Anion gap: 6 (ref 5–15)
BUN: 5 mg/dL — AB (ref 6–23)
CALCIUM: 8.2 mg/dL — AB (ref 8.4–10.5)
CO2: 23 mEq/L (ref 19–32)
CREATININE: 0.43 mg/dL — AB (ref 0.50–1.35)
Chloride: 103 mEq/L (ref 96–112)
Glucose, Bld: 93 mg/dL (ref 70–99)
POTASSIUM: 3.7 meq/L (ref 3.7–5.3)
Sodium: 132 mEq/L — ABNORMAL LOW (ref 137–147)

## 2014-04-11 LAB — TROPONIN I

## 2014-04-11 LAB — PROTIME-INR
INR: 1.67 — ABNORMAL HIGH (ref 0.00–1.49)
PROTHROMBIN TIME: 19.9 s — AB (ref 11.6–15.2)

## 2014-04-11 MED ORDER — HYDROMORPHONE HCL 1 MG/ML IJ SOLN
1.0000 mg | Freq: Four times a day (QID) | INTRAMUSCULAR | Status: DC | PRN
Start: 2014-04-11 — End: 2014-04-18
  Administered 2014-04-11 – 2014-04-16 (×9): 1 mg via INTRAVENOUS
  Filled 2014-04-11 (×10): qty 1

## 2014-04-11 MED ORDER — HYDROMORPHONE HCL 1 MG/ML IJ SOLN: 1.0000 mg | INTRAMUSCULAR | Status: DC | PRN

## 2014-04-11 MED ORDER — LORAZEPAM 2 MG/ML IJ SOLN
1.0000 mg | Freq: Once | INTRAMUSCULAR | Status: AC
  Administered 2014-04-11: 1 mg via INTRAVENOUS
  Filled 2014-04-11: qty 1

## 2014-04-11 MED ORDER — MORPHINE SULFATE 15 MG PO TABS
15.0000 mg | ORAL_TABLET | ORAL | Status: DC | PRN
  Administered 2014-04-11 – 2014-04-22 (×13): 15 mg via ORAL
  Filled 2014-04-11 (×15): qty 1

## 2014-04-11 MED ORDER — MORPHINE SULFATE 15 MG PO TABS
15.0000 mg | ORAL_TABLET | Freq: Once | ORAL | Status: AC
  Administered 2014-04-11: 15 mg via ORAL
  Filled 2014-04-11: qty 1

## 2014-04-11 MED ORDER — ALPRAZOLAM 0.5 MG PO TABS
0.5000 mg | ORAL_TABLET | Freq: Two times a day (BID) | ORAL | Status: DC | PRN
  Administered 2014-04-11: 0.5 mg via ORAL
  Filled 2014-04-11: qty 1

## 2014-04-11 MED ORDER — ALPRAZOLAM 0.25 MG PO TABS
0.2500 mg | ORAL_TABLET | Freq: Once | ORAL | Status: AC
  Administered 2014-04-11: 0.25 mg via ORAL
  Filled 2014-04-11: qty 1

## 2014-04-11 NOTE — Progress Notes (Signed)
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 He was admitted to [facility], from [facility] rehabilitation facility, patient was recently in the hospital for treatment of L3-L4 discitis and abscess at the L5-S1 level. He was discharged on cefepime, daptomycin and fluconazole. In the nursing facility patient complained about worsening back pain, brought back to the ED and repeat MRI of the back showed worsening of L3-4 discitis and new discitis at the level of T10-11. He was admitted to the hospital for further evaluation, started on daptomycin and micafungin per ID. On 11/3 ID stopped his antbiotic and antfungal therapy and requested that Neurosurgery re-evaluate the patient and attempt to obtain deep cultures in order to target treatment.   11/4:  Patient with change in status.  Agitated. Crying. Refusing to eat. Complaining of left hip pain. Providers:  Please update the patient and his Rachell Cipro,  on [phone]  Assessment/Plan: Acute discitis:  Recent h/o L3-4 discitis and L5-S1 abscess. Came in with new discitis at T10-11, worsening L5-S1 abscess, and persistent L3-4 discitis. Patient discharged on cefepime, daptomycin and fluconazole. Seen by ID here and placed on Micafungin and daptomycin 8 mg/kg. These were subsequently discontinued on 11/3 in preparation for deep biopsy.  Infectious Disease (Dr. Drucilla Schmidt) is discussing his case with Neuro Surgery for possible deep biopsy.  Nausea with intermittent vomiting:   Resolved.  Suspect releated to narcotics and constipation. Now having BM's on senna.  Was on scheduled Zofran before meals.  Will trial discontinuation of scheduled Zofran and continue PRN.  Tolerating soft diet well.   Back Pain:   Secondary to diskitis with a  component of anxiety.   On Klonopin QHS.Pain is overall better 6/10. Back brace that was added for support is helping him become more mobile.   11/3 patient describes severe pain in left hip after using the bathroom last night.  Complete hip xray is negative.  11/4 patient with increased anxiety and severe pain in left hip. Continue MS Contin 60 mg, add Dilaudid 1 mg IV prn, decrease MSIR from 30 to 15 mg prn.  Also added Xanax 0.5 mg bid prn in addition to current klonopin qhs.  Anticipate neurosurgery re-evaluation today 11/4.  AIDS/HIV infection:   Absolute CD4 count is 40 on 01/27/14.   Viral load is undetectable.  Patient is on Truvada, Prezista and Norvir. Azithromycin for prophylaxis.   Infectious Disease is on board.  Cirrhosis:   Reportedly secondary to previous lymphoma infiltration to the liver and R-CHOP chemotherapy.   Radiological evidence of cirrhosis and portal hypertension, with massive splenomegaly and esophageal varices.   INR is 1.86. Hep C antibody is negative. Bili Rubin rising to 1.6 on 11/2.  Will recheck TB on 11/4.  Constipation:   Resolved.  On bowel regimen with Senna and Miralax.  Patient's last BM was on 11/2  Pancytopenia:   Likely multifactorial from bone marrow suppression from AIDS/cirrhosis and hypersplenism.  Labs show stable leukopenia, anemia and thrombocytopenia.  Will continue to monitor as needed while patient is in hospital.  Severe protein calorie malnutrition: Continue with diet supplements after discharge   History large B-cell lymphoma 2009:   Metastasis to the back. Was treated with chemotherapy in 2009.   Bone marrow biopsy done on 11/02/2013 in the [facility], showed no evidence of recurrent disease.  DVT Prophylaxis:  Lovenox Code Status: Full Family Communication: Patient is AAOx3.  Will talk with Damar today on [phone] Disposition Plan: SNF when appropriate.  After neurosurgery  re-evaluation.   Consultants:  ID  Neurosurgery.  Procedures:  None  Antibiotics:  Micafungin 10/17>> 04/10/2014  Daptomycin continued since discharge last time >> 04/10/2014  Objective: Filed Vitals:   04/10/14 0708 04/10/14 1300 04/10/14 1448 04/10/14 2236  BP: 109/63 114/52 114/52 135/80  Pulse: 95 95 95 105  Temp: 98.7 F (37.1 C) 98.6 F (37 C) 98.6 F (37 C) 98.2 F (36.8 C)  TempSrc: Oral Oral Oral Oral  Resp: 18 18 20 22   Height:      Weight:      SpO2: 97% 96% 96% 95%    Intake/Output Summary (Last 24 hours) at 04/11/14 1048 Last data filed at 04/10/14 2153  Gross per 24 hour  Intake      0 ml  Output    250 ml  Net   -250 ml   Filed Weights   03/23/14 1241 03/24/14 0057 03/30/14 0743  Weight: 74.844 kg (165 lb) 79.198 kg (174 lb 9.6 oz) 77.9 kg (171 lb 11.8 oz)    Exam: General: Patient is crying.  Refuses to eat.  Threw his ice bucket at the door.  HEENT:  EOMI, Anicteic Sclera, MMM.  Neck: Supple, no JVD, no masses  Cardiovascular: RRR, S1 S2 auscultated, no rubs, murmurs or gallops.   Respiratory: Clear to auscultation bilaterally with equal chest rise Abdomen: Soft, nontender, nondistended, + bowel sounds.  Patient refuses to move to allow me to further examine him. Extremities: warm dry without cyanosis clubbing or edema. No obvious sign of injury to the left hip.  Ice pack on the site. Skin: Per RN-stage 2 pressure ulceration on sacrum.    Data Reviewed: Basic Metabolic Panel:  Recent Labs Lab 04/05/14 0542 04/09/14 0500 04/11/14 0522  NA 134* 131* 132*  K 4.1 3.9 3.7  CL 102 101 103  CO2 24 23 23   GLUCOSE 65* 70 93  BUN 4* 3* 5*  CREATININE 0.47* 0.45* 0.43*  CALCIUM 8.7 8.2* 8.2*   Liver Function Tests:  Recent Labs Lab 04/09/14 0500  AST 31  ALT 15  ALKPHOS 91  BILITOT 1.6*  PROT 7.0  ALBUMIN 1.9*   CBC:  Recent Labs Lab 04/05/14 0542 04/09/14 0500  WBC 1.8* 2.5*  NEUTROABS  --  1.2*  HGB 8.2* 8.0*  HCT  24.6* 23.8*  MCV 98.4 94.8  PLT 96* 104*   Cardiac Enzymes:  Recent Labs Lab 04/08/14 0445 04/09/14 0500 04/10/14 0521  CKTOTAL 223 241* 127    Studies: Dg Hip Complete Left  04/11/2014   CLINICAL DATA:  Patient felt pop in left hip with left hip pain. History of spinal infection.  EXAM: LEFT HIP - COMPLETE 2+ VIEW  COMPARISON:  CT 03/23/2014 and plain film 11/12/2013  FINDINGS: There are minimal degenerative changes of the right hip. There is no evidence of osteolysis. There is no fracture or dislocation. Mild degenerative change of the lumbar spine.  IMPRESSION: No acute findings.  Mild degenerative change of the right hip.   Electronically Signed   By: Marin Olp M.D.   On: 04/11/2014 07:59    Scheduled Meds: . atovaquone  750 mg Oral Daily  . buPROPion  300 mg Oral Daily  . clonazePAM  1 mg Oral QHS  . Darunavir Ethanolate  800 mg Oral Q breakfast  .  emtricitabine-tenofovir  1 tablet Oral QHS  . enoxaparin (LOVENOX) injection  40 mg Subcutaneous Q24H  . feeding supplement (ENSURE COMPLETE)  237 mL Oral BID BM  . feeding supplement (ENSURE)  1 Container Oral TID BM  . gabapentin  400 mg Oral TID  . morphine  60 mg Oral Q12H  . polyethylene glycol  17 g Oral Daily  . ritonavir  100 mg Oral Q breakfast  . senna-docusate  2 tablet Oral Daily  . sucralfate  1 g Oral TID WC   Continuous Infusions: . sodium chloride 10 mL/hr at 04/10/14 0601    Principal Problem:   Diskitis Active Problems:   AIDS   Cirrhosis, non-alcoholic   Pancytopenia   Protein-calorie malnutrition, severe   Abscess in epidural space of lumbar spine   Infectious discitis   Discitis of lumbar region   HIV (human immunodeficiency virus infection)   Osteomyelitis of lumbar spine   Fungal osteomyelitis   VRE (vancomycin resistant enterococcus) culture positive    Imogene Burn, PA-C [facility] Pager [phone]. If 7PM-7AM, please contact night-coverage at [email or web address], password  [facility] 04/11/2014, 10:48 AM  LOS: 19 days

## 2014-04-11 NOTE — Progress Notes (Signed)
Pt refuses to take "any pills except pain pills." Pt refuses nursing assessment. Pt threw ice pitcher w/ water at room door when staff were not present.

## 2014-04-11 NOTE — Progress Notes (Signed)
Patient ID: Tia Gelb, male   DOB: 1976-01-02, 38 y.o.   MRN: [id number] Been asked by infectious disease to proceed forward with open biopsy/excision discectomy and cultures for recurrent intractable discitis.patient has multiple levels of discitis and osteomyelitis the one of the worst appears to be L5-S1. Patient does have some element of lumbar radiculopathy and a left S1 distribution. At that level he does have significant increased T2 signal as well as a small epidural component underneath the S1 nerve root. This is where I recommended we target due to a relatively safe location. Patient is neurologically intact and nonfocal he is pancytopenic but appears to have plan accounts in excess of 100 coags are pending I have extensively gone over the risks and benefits of a left-sided L5-S1 laminectomy and microdiscectomy. He understands these risks and agrees to proceed forward. We'll plan that for tomorrow evening.

## 2014-04-11 NOTE — Progress Notes (Signed)
[facility]    Antibiotics stopped    Subjective: He was very upset that I stopped his antibiotics and claims he now has worsening left hip pain due to them being stopped. I explained rationale for stopping them (to increase yield on cultures from OR). I tried per his request to contact his HCPOA @ [phone] but only got voice mail   Antibiotics:  Anti-infectives    Start     Dose/Rate Route Frequency Ordered Stop   03/29/14 0000  DAPTOmycin 630 mg in sodium chloride 0.9 % 100 mL     630 mg225.2 mL/hr over 30 Minutes Intravenous Every 24 hours 03/29/14 1056     03/29/14 0000  micafungin 150 mg in sodium chloride 0.9 % 100 mL     150 mg100 mL/hr over 1 Hours Intravenous Daily 03/29/14 1056     03/25/14 0800  DAPTOmycin (CUBICIN) 630 mg in sodium chloride 0.9 % IVPB  Status:  Discontinued    Comments:  ~8 mg/kg   630 mg225.2 mL/hr over 30 Minutes Intravenous Every 24 hours 03/24/14 1337 04/10/14 1243   03/24/14 1400  micafungin (MYCAMINE) 150 mg in sodium chloride 0.9 % 100 mL IVPB  Status:  Discontinued     150 mg100 mL/hr over 1 Hours Intravenous Daily 03/24/14 1326 04/10/14 1243   03/24/14 1000  azithromycin (ZITHROMAX) tablet 1,200 mg  Status:  Discontinued     1,200 mg Oral Every Sat 03/24/14 0113 04/10/14 1243   03/24/14 1000  ceFEPIme (MAXIPIME) 2 g in dextrose 5 % 50 mL IVPB  Status:  Discontinued     2 g100 mL/hr over 30 Minutes Intravenous Every 12 hours 03/24/14 0113 03/24/14 1326   03/24/14 1000  fluconazole (DIFLUCAN) tablet 400 mg  Status:  Discontinued     400 mg Oral Daily 03/24/14 0113 03/24/14 1326   03/24/14 0800  Darunavir Ethanolate (PREZISTA) tablet 800 mg     800 mg Oral Daily with breakfast 03/24/14 0113     03/24/14 0800  ritonavir (NORVIR) capsule 100 mg     100 mg Oral Daily with breakfast 03/24/14 0113     03/24/14 0145  DAPTOmycin (CUBICIN)  630 mg in sodium chloride 0.9 % IVPB    Comments:  ~8 mg/kg   630 mg225.2 mL/hr over 30 Minutes Intravenous  Once 03/24/14 0140 03/24/14 0344   03/24/14 0112  atovaquone (MEPRON) 750 MG/5ML suspension 750 mg     750 mg Oral Daily 03/24/14 0113     03/24/14 0112  emtricitabine-tenofovir (TRUVADA) 200-300 MG per tablet 1 tablet     1 tablet Oral Daily at bedtime 03/24/14 0113        Medications: Scheduled Meds: . atovaquone  750 mg Oral Daily  . buPROPion  300 mg Oral Daily  . clonazePAM  1 mg Oral QHS  . Darunavir Ethanolate  800 mg Oral Q breakfast  . emtricitabine-tenofovir  1 tablet Oral QHS  . enoxaparin (LOVENOX) injection  40 mg Subcutaneous Q24H  . feeding supplement (ENSURE COMPLETE)  237 mL Oral BID BM  . feeding supplement (ENSURE)  1 Container Oral TID BM  . gabapentin  400 mg Oral TID  . morphine  60 mg Oral Q12H  . polyethylene glycol  17 g Oral Daily  . ritonavir  100 mg Oral Q breakfast  . senna-docusate  2 tablet Oral Daily  . sucralfate  1 g Oral TID WC   Continuous Infusions: . sodium chloride 10 mL/hr  at 04/10/14 0601   PRN Meds:.ALPRAZolam, bisacodyl, HYDROmorphone (DILAUDID) injection, methocarbamol, morphine, ondansetron (ZOFRAN) IV, sodium chloride, sodium phosphate, temazepam    Objective: Weight change:   Intake/Output Summary (Last 24 hours) at 04/11/14 2027 Last data filed at 04/11/14 1558  Gross per 24 hour  Intake    140 ml  Output   1050 ml  Net   -910 ml   Blood pressure 122/62, pulse 101, temperature 98.9 F (37.2 C), temperature source Oral, resp. rate 24, height 5\' 8"  (1.727 m), weight 171 lb 11.8 oz (77.9 kg), SpO2 95 %. Temp:  [98.2 F (36.8 C)-98.9 F (37.2 C)] 98.9 F (37.2 C) (11/04 1533) Pulse Rate:  [94-105] 101 (11/04 1533) Resp:  [16-24] 24 (11/04 1533) BP: (117-135)/(62-80) 122/62 mmHg (11/04 1533) SpO2:  [94 %-95 %] 95 % (11/04 1533)  Physical Exam: General: Alert and awake, oriented x3, not in any acute  distress. Sitting in chair with brace Neuro: nonfocal  CBC:  CBC Latest Ref Rng 04/09/2014 04/05/2014 03/30/2014  WBC 4.0 - 10.5 K/uL 2.5(L) 1.8(L) 2.2(L)  Hemoglobin 13.0 - 17.0 g/dL 8.0(L) 8.2(L) 8.5(L)  Hematocrit 39.0 - 52.0 % 23.8(L) 24.6(L) 24.8(L)  Platelets 150 - 400 K/uL 104(L) 96(L) 91(L)      BMET  Recent Labs  04/11/14 0522 04/11/14 1220  NA 132* 134*  K 3.7 3.8  CL 103 103  CO2 23 22  GLUCOSE 93 71  BUN 5* 4*  CREATININE 0.43* 0.45*  CALCIUM 8.2* 8.3*     Liver Panel   Recent Labs  04/09/14 0500 04/11/14 1220  PROT 7.0 7.4  ALBUMIN 1.9* 2.0*  AST 31 29  ALT 15 16  ALKPHOS 91 96  BILITOT 1.6* 1.7*       Sedimentation Rate No results for input(s): ESRSEDRATE in the last 72 hours. C-Reactive Protein No results for input(s): CRP in the last 72 hours.  Micro Results: Recent Results (from the past 720 hour(s))  Surgical pcr screen     Status: None   Collection Time: 03/24/14  1:51 AM  Result Value Ref Range Status   MRSA, PCR NEGATIVE NEGATIVE Final   Staphylococcus aureus NEGATIVE NEGATIVE Final    Comment:        The Xpert SA Assay (FDA approved for NASAL specimens in patients over 79 years of age), is one component of a comprehensive surveillance program.  Test performance has been validated by [employer] for patients greater than or equal to 101 year old. It is not intended to diagnose infection nor to guide or monitor treatment.    Studies/Results: Dg Hip Complete Left  04/11/2014   CLINICAL DATA:  Patient felt pop in left hip with left hip pain. History of spinal infection.  EXAM: LEFT HIP - COMPLETE 2+ VIEW  COMPARISON:  CT 03/23/2014 and plain film 11/12/2013  FINDINGS: There are minimal degenerative changes of the right hip. There is no evidence of osteolysis. There is no fracture or dislocation. Mild degenerative change of the lumbar spine.  IMPRESSION: No acute findings.  Mild degenerative change of the right hip.    Electronically Signed   By: Marin Olp M.D.   On: 04/11/2014 07:59      Assessment/Plan:  Principal Problem:   Diskitis Active Problems:   AIDS   Cirrhosis, non-alcoholic   Pancytopenia   Protein-calorie malnutrition, severe   Abscess in epidural space of lumbar spine   Infectious discitis   Discitis of lumbar region   HIV (human immunodeficiency  virus infection)   Osteomyelitis of lumbar spine   Fungal osteomyelitis   VRE (vancomycin resistant enterococcus) culture positive    Bayan Kushnir is a 38 y.o. 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 Dr. Linus Salmons broadened anti-fungals to mycafungin which he has been on with daptomycin  #1 Thoracic and Lumbar Diskitis: persistent infection in L spine and new infection in T spine DESPITE MULTIPLE ROUNDS OF IV ANTIBIOTICS INCLUDING ANTIFUNGALS  GREATLY APPRECIATE DR. CRAM'S HELP HERE and he is going to take pt to OR tomorrow night    --HOPEFULLY THIS WILL HELP CONTROL HIS INFECTION AND  WITH CULTURES SENT FOR   #1 BACTERIAL CULTURS #2 FUNGAL CULTURES #3 AFB CULTURES  GIVE [place] A TARGET FOR ANTIMICROBIAL THERAPY  I have DCD  HIS DAPTOMICIN AND MICAFUNGIN ALONG WITH AZITHRO TO INCREASE YIELD ON CULTURES  #2 Hip pain: likely referred pain due to his infetion but would consider MRI hip as well after surgery  #3 HIV: doing perfectly with suppression and undetectable viral load   LOS: 19 days   Alcide Evener 04/11/2014, 8:27 PM

## 2014-04-11 NOTE — Progress Notes (Signed)
Pt complaining of pain 10/10 in his hip. Paged provider on call since pt is stating po morphine isn't helping. Awaiting further orders from provider. May increase pt's po medicine dose.

## 2014-04-11 NOTE — Progress Notes (Signed)
PT Cancellation Note  Patient Details Name: Samuel Schultz MRN: [id number] DOB: 14-Jan-1976   Cancelled Treatment:    Reason Eval/Treat Not Completed: Patient declined, no reason specified. Pt reports he is in too much pain to participate with PT today. Pt moving LE's in bed with no apparent pain, and therapist provided max encouragement for OOB to chair. Pt continues to refuse, stating he does not want to "over-do it".     Rolinda Roan 04/11/2014, 1:52 PM  Rolinda Roan, PT, DPT Acute Rehabilitation Services Pager: [phone]

## 2014-04-12 DIAGNOSIS — M25559 Pain in unspecified hip: Secondary | ICD-10-CM | POA: Insufficient documentation

## 2014-04-12 LAB — SURGICAL PCR SCREEN
MRSA, PCR: NEGATIVE
STAPHYLOCOCCUS AUREUS: NEGATIVE

## 2014-04-12 MED ORDER — BIOTENE DRY MOUTH MT LIQD: 15.0000 mL | OROMUCOSAL | Status: DC | PRN

## 2014-04-12 MED ORDER — SODIUM CHLORIDE 0.9 % IV SOLN
Freq: Once | INTRAVENOUS | Status: AC
Start: 2014-04-12 — End: 2014-04-12
  Administered 2014-04-12: 18:00:00 via INTRAVENOUS

## 2014-04-12 NOTE — Clinical Social Work Note (Signed)
Patient for surgery this evening. CSW will meet with patient tomorrow to update on plan for discharge and assess his current emotional state regarding surgery and prolonged hospital stay.  Liz Beach MSW, [place], [place], [phone]

## 2014-04-12 NOTE — Progress Notes (Signed)
[facility]    Antibiotics stopped    Subjective: Met with patient and his HCPOA at bedside this am   Antibiotics:  Anti-infectives    Start     Dose/Rate Route Frequency Ordered Stop   03/29/14 0000  DAPTOmycin 630 mg in sodium chloride 0.9 % 100 mL     630 mg225.2 mL/hr over 30 Minutes Intravenous Every 24 hours 03/29/14 1056     03/29/14 0000  micafungin 150 mg in sodium chloride 0.9 % 100 mL     150 mg100 mL/hr over 1 Hours Intravenous Daily 03/29/14 1056     03/25/14 0800  DAPTOmycin (CUBICIN) 630 mg in sodium chloride 0.9 % IVPB  Status:  Discontinued    Comments:  ~8 mg/kg   630 mg225.2 mL/hr over 30 Minutes Intravenous Every 24 hours 03/24/14 1337 04/10/14 1243   03/24/14 1400  micafungin (MYCAMINE) 150 mg in sodium chloride 0.9 % 100 mL IVPB  Status:  Discontinued     150 mg100 mL/hr over 1 Hours Intravenous Daily 03/24/14 1326 04/10/14 1243   03/24/14 1000  azithromycin (ZITHROMAX) tablet 1,200 mg  Status:  Discontinued     1,200 mg Oral Every Sat 03/24/14 0113 04/10/14 1243   03/24/14 1000  ceFEPIme (MAXIPIME) 2 g in dextrose 5 % 50 mL IVPB  Status:  Discontinued     2 g100 mL/hr over 30 Minutes Intravenous Every 12 hours 03/24/14 0113 03/24/14 1326   03/24/14 1000  fluconazole (DIFLUCAN) tablet 400 mg  Status:  Discontinued     400 mg Oral Daily 03/24/14 0113 03/24/14 1326   03/24/14 0800  Darunavir Ethanolate (PREZISTA) tablet 800 mg     800 mg Oral Daily with breakfast 03/24/14 0113     03/24/14 0800  ritonavir (NORVIR) capsule 100 mg     100 mg Oral Daily with breakfast 03/24/14 0113     03/24/14 0145  DAPTOmycin (CUBICIN) 630 mg in sodium chloride 0.9 % IVPB    Comments:  ~8 mg/kg   630 mg225.2 mL/hr over 30 Minutes Intravenous  Once 03/24/14 0140 03/24/14 0344   03/24/14 0112  atovaquone (MEPRON) 750 MG/5ML suspension 750 mg     750 mg Oral Daily  03/24/14 0113     03/24/14 0112  emtricitabine-tenofovir (TRUVADA) 200-300 MG per tablet 1 tablet     1 tablet Oral Daily at bedtime 03/24/14 0113        Medications: Scheduled Meds: . sodium chloride   Intravenous Once  . atovaquone  750 mg Oral Daily  . buPROPion  300 mg Oral Daily  . clonazePAM  1 mg Oral QHS  . Darunavir Ethanolate  800 mg Oral Q breakfast  . emtricitabine-tenofovir  1 tablet Oral QHS  . enoxaparin (LOVENOX) injection  40 mg Subcutaneous Q24H  . feeding supplement (ENSURE COMPLETE)  237 mL Oral BID BM  . feeding supplement (ENSURE)  1 Container Oral TID BM  . gabapentin  400 mg Oral TID  . morphine  60 mg Oral Q12H  . polyethylene glycol  17 g Oral Daily  . ritonavir  100 mg Oral Q breakfast  . senna-docusate  2 tablet Oral Daily  . sucralfate  1 g Oral TID WC   Continuous Infusions: . sodium chloride 10 mL/hr at 04/11/14 2317   PRN Meds:.ALPRAZolam, antiseptic oral rinse, bisacodyl, HYDROmorphone (DILAUDID) injection, methocarbamol, morphine, ondansetron (ZOFRAN) IV, sodium chloride, sodium phosphate, temazepam    Objective: Weight change:   Intake/Output Summary (  Last 24 hours) at 04/12/14 1744 Last data filed at 04/12/14 1300  Gross per 24 hour  Intake      0 ml  Output      0 ml  Net      0 ml   Blood pressure 118/60, pulse 94, temperature 98.1 F (36.7 C), temperature source Oral, resp. rate 16, height _0  (1.727 m), weight 171 lb 11.8 oz (77.9 kg), SpO2 93 %. Temp:  [98.1 F (36.7 C)-98.3 F (36.8 C)] 98.1 F (36.7 C) (11/05 1344) Pulse Rate:  [93-96] 94 (11/05 1344) Resp:  [16-20] 16 (11/05 1344) BP: (118-133)/(60-69) 118/60 mmHg (11/05 1344) SpO2:  [93 %-96 %] 93 % (11/05 1344)  Physical Exam: General: Alert and awake, oriented x3, not in any acute distress. Sitting in chair with brace Neuro: nonfocal  CBC:  CBC Latest Ref Rng 04/09/2014 04/05/2014 03/30/2014  WBC 4.0 - 10.5 K/uL 2.5(L) 1.8(L) 2.2(L)  Hemoglobin 13.0 - 17.0  g/dL 8.0(L) 8.2(L) 8.5(L)  Hematocrit 39.0 - 52.0 % 23.8(L) 24.6(L) 24.8(L)  Platelets 150 - 400 K/uL 104(L) 96(L) 91(L)      BMET  Recent Labs  04/11/14 0522 04/11/14 1220  NA 132* 134*  K 3.7 3.8  CL 103 103  CO2 23 22  GLUCOSE 93 71  BUN 5* 4*  CREATININE 0.43* 0.45*  CALCIUM 8.2* 8.3*     Liver Panel   Recent Labs  04/11/14 1220  PROT 7.4  ALBUMIN 2.0*  AST 29  ALT 16  ALKPHOS 96  BILITOT 1.7*       Sedimentation Rate No results for input(s): ESRSEDRATE in the last 72 hours. C-Reactive Protein No results for input(s): CRP in the last 72 hours.  Micro Results: Recent Results (from the past 720 hour(s))  Surgical pcr screen     Status: None   Collection Time: 03/24/14  1:51 AM  Result Value Ref Range Status   MRSA, PCR NEGATIVE NEGATIVE Final   Staphylococcus aureus NEGATIVE NEGATIVE Final    Comment:        The Xpert SA Assay (FDA approved for NASAL specimens in patients over 34 years of age), is one component of a comprehensive surveillance program.  Test performance has been validated by [employer] for patients greater than or equal to 3 year old. It is not intended to diagnose infection nor to guide or monitor treatment.  Surgical pcr screen     Status: None   Collection Time: 04/12/14  1:29 AM  Result Value Ref Range Status   MRSA, PCR NEGATIVE NEGATIVE Final   Staphylococcus aureus NEGATIVE NEGATIVE Final    Comment:        The Xpert SA Assay (FDA approved for NASAL specimens in patients over 34 years of age), is one component of a comprehensive surveillance program.  Test performance has been validated by [employer] for patients greater than or equal to 24 year old. It is not intended to diagnose infection nor to guide or monitor treatment.     Studies/Results: Dg Hip Complete Left  04/11/2014   CLINICAL DATA:  Patient felt pop in left hip with left hip pain. History of spinal infection.  EXAM: LEFT HIP - COMPLETE 2+  VIEW  COMPARISON:  CT 03/23/2014 and plain film 11/12/2013  FINDINGS: There are minimal degenerative changes of the right hip. There is no evidence of osteolysis. There is no fracture or dislocation. Mild degenerative change of the lumbar spine.  IMPRESSION: No acute findings.  Mild  degenerative change of the right hip.   Electronically Signed   By: Marin Olp M.D.   On: 04/11/2014 07:59      Assessment/Plan:  Principal Problem:   Diskitis Active Problems:   AIDS   Cirrhosis, non-alcoholic   Pancytopenia   Protein-calorie malnutrition, severe   Abscess in epidural space of lumbar spine   Infectious discitis   Discitis of lumbar region   HIV (human immunodeficiency virus infection)   Osteomyelitis of lumbar spine   Fungal osteomyelitis   VRE (vancomycin resistant enterococcus) culture positive    Johanan Skorupski is a 38 y.o. 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 Dr. Linus Salmons broadened anti-fungals to mycafungin which he has been on with daptomycin  #1 Thoracic and Lumbar Diskitis: persistent infection in L spine and new infection in T spine DESPITE MULTIPLE ROUNDS OF IV ANTIBIOTICS INCLUDING ANTIFUNGALS  GREATLY APPRECIATE DR. CRAM'S HELP HERE and he is going to take pt to OR tomorrow night  --HOPEFULLY THIS WILL HELP CONTROL HIS INFECTION AND  WITH CULTURES SENT FOR   #1 BACTERIAL CULTURS #2 FUNGAL CULTURES #3 AFB CULTURES  GIVE [place] A TARGET FOR ANTIMICROBIAL THERAPY  I have DCD  HIS DAPTOMICIN AND MICAFUNGIN ALONG  WITH AZITHRO TO INCREASE YIELD ON CULTURES  UNFORTUNATELY HIS INR WAS HIGH AND COAGULATION COULD NOT BE CORRECTED IN TIMELY MANNER FOR NEUROSURGERY IS BEING POSTPONED UNTIL Monday  I WOULD LIKE HIM TO REMAIN OFF ALL ANTIBIOTICS IN THE MEANTIME TO GIVE [place] EVEN GREATER CHANCE OF RECOVERING A PATHOGEN IN THE OR ON Monday  PLEASE DO NOT RESTART ANTIBIOTICS  #2 Hip pain: likely referred pain due to his infetion but would consider MRI hip   #3 HIV: doing perfectly with suppression and undetectable viral load  Dr. Linus Salmons is covering for me tomorrow and Dr. Megan Salon will be here this weekend. I will be back on Monday.     LOS: 20 days   Alcide Evener 04/12/2014, 5:44 PM

## 2014-04-12 NOTE — Progress Notes (Signed)
Physical Therapy Evaluation Patient Details Name: Samuel Schultz MRN: [id number] DOB: 07-26-1975 Today's Date: 04/12/2014   History of Present Illness  Pt is a 38 y.o. male with history of HIV/AIDS, lymphoma, nonalcoholic cirrhosis, coagulopathy, pancytopenia presented to the ER because of worsening back pain. Patient states that he has been having pain in his low back last few days it has been radiating to his right leg. Denies any fever chills. Patient was recently admitted for discitis and is on IV daptomycin and ceftriaxone. MRI L-spine shows epidural abscess L5-S1 area. On-call neurosurgeon Dr. Saintclair Halsted was consulted and Dr. Saintclair Halsted at this time feels patient does not have any cauda equina features.  Clinical Impression  Pain in L flank/hip limiting mobility.  Pt pushed to get up oob minimally and complete some exercise.  Pt for surgery later today.     Follow Up Recommendations SNF;Supervision/Assistance - 24 hour    Equipment Recommendations  None recommended by PT    Recommendations for Other Services       Precautions / Restrictions Precautions Precautions: Fall      Mobility  Bed Mobility Overal bed mobility: Needs Assistance Bed Mobility: Rolling;Sidelying to Sit Rolling: Min assist Sidelying to sit: Mod assist       General bed mobility comments: Assist to help due to pain  Transfers Overall transfer level: Needs assistance Equipment used: Rolling walker (2 wheeled) Transfers: Sit to/from Stand Sit to Stand: Min assist         General transfer comment: help to come forward  Ambulation/Gait Ambulation/Gait assistance: Min assist Ambulation Distance (Feet): 30 Feet Assistive device: Rolling walker (2 wheeled)       General Gait Details: guarded and slow  Stairs            Wheelchair Mobility    Modified Rankin (Stroke Patients Only)       Balance     Sitting balance-Leahy Scale: Fair                                        Pertinent Vitals/Pain Pain Assessment: Faces Faces Pain Scale: Hurts whole lot Pain Location: L flank/hip Pain Descriptors / Indicators: Aching;Sharp Pain Intervention(s): Limited activity within patient's tolerance    Home Living                        Prior Function                 Hand Dominance        Extremity/Trunk Assessment                         Communication      Cognition Arousal/Alertness: Awake/alert Behavior During Therapy: WFL for tasks assessed/performed Overall Cognitive Status: Within Functional Limits for tasks assessed         Following Commands: Follows one step commands consistently;Follows multi-step commands with increased time            General Comments      Exercises General Exercises - Lower Extremity Ankle Circles/Pumps: AROM;20 reps Quad Sets: AROM;Strengthening;Both;10 reps;Supine Gluteal Sets: AROM;Strengthening;Both;10 reps;Supine Heel Slides: AROM;Strengthening;Both;10 reps;Supine (resisted) Hip ABduction/ADduction: AROM;AAROM;Both;Strengthening;10 reps;Supine Straight Leg Raises: AAROM;Right;10 reps;Supine      Assessment/Plan    PT Assessment    PT Diagnosis     PT Problem List    PT Treatment Interventions  PT Goals (Current goals can be found in the Care Plan section) Acute Rehab PT Goals Patient Stated Goal: None stated during session PT Goal Formulation: With patient Time For Goal Achievement: 04/09/14    Frequency     Barriers to discharge        Co-evaluation               End of Session   Activity Tolerance: Patient tolerated treatment well Patient left: in bed;with call bell/phone within reach           Time: 0955-1046 PT Time Calculation (min): 51 min   Charges:     PT Treatments $Gait Training: 8-22 mins $Therapeutic Exercise: 8-22 mins $Therapeutic Activity: 8-22 mins   PT G Codes:          Bryden Darden, Tessie Fass 04/12/2014, 10:59  AM 04/12/2014  Donnella Sham, PT [phone]  (pager)

## 2014-04-12 NOTE — Progress Notes (Signed)
Patient ID: Samuel Schultz, male   DOB: [birth year], 38 y.o.   MRN: [id number] Patient noted on today's labs to be coagulopathic with an INR 1.67. This is too high for surgery at this point. No good way to rapidly reverse hip tach coagulopathy in this patient. So we will cancel surgery. I we will added on for Monday afternoon and we will arrange transfusion of 2 units of fresh frozen plasma on Saturday with the expectation of a follow-up INR afterward our target is less than 1.2. I'd recommend giving him a dose of vitamin K as well as additional units of FFP on Sunday should the transfusion on Saturday not achieve this goal. Dr. Drucilla Schmidt would prefer to keep him off IV antibiotics during this time.

## 2014-04-12 NOTE — Progress Notes (Signed)
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 He was admitted to [facility], from [facility] rehabilitation facility, patient was recently in the hospital for treatment of L3-L4 discitis and abscess at the L5-S1 level. He was discharged on cefepime, daptomycin and fluconazole. In the nursing facility patient complained about worsening back pain, brought back to the ED and repeat MRI of the back showed worsening of L3-4 discitis and new discitis at the level of T10-11. He was admitted to the hospital for further evaluation, started on daptomycin and micafungin per ID. On 11/3 ID stopped his antbiotic and antfungal therapy and requested that Neurosurgery re-evaluate the patient and attempt to obtain deep cultures in order to target treatment.  Left-sided L5-S1 laminectomy and microdiscectomy planned for 04/12/2014   Assessment/Plan: Acute discitis:  Recent h/o L3-4 discitis and L5-S1 abscess. Came in with new discitis at T10-11, worsening L5-S1 abscess, and persistent L3-4 discitis. Patient discharged on cefepime, daptomycin and fluconazole. Seen by ID here and placed on Micafungin and daptomycin 8 mg/kg. These were subsequently discontinued on 11/3 in preparation for deep biopsy.    Left-sided L5-S1 laminectomy and microdiscectomy scheduled for 04/12/2014.  Nausea with intermittent vomiting:   Resolved.  Suspect releated to narcotics and constipation. Now having BM's on senna.  Tolerating soft diet well.   Back Pain:   Secondary to diskitis with a component of anxiety.   On Klonopin QHS.Pain is overall better 6/10. Back brace that was added for support is helping him become more mobile.   11/3 patient describes severe pain in left hip after using the bathroom last night.  Complete hip  xray is negative.  11/4 patient with increased anxiety and severe pain in left hip. Continue MS Contin 60 mg, add Dilaudid 1 mg IV prn, decrease MSIR from 30 to 15 mg prn.  Also added Xanax 0.5 mg bid prn in addition to current klonopin qhs.  Per Neurosurgery, patient has a component of lumbar radiculopathy and a left S1 distribution.  This could be the source of his hip pain.  AIDS/HIV infection:   Absolute CD4 count is 40 on 01/27/14.   Viral load is undetectable.  Patient is on Truvada, Prezista and Norvir. Azithromycin for prophylaxis.   Infectious Disease is on board.  Cirrhosis:   Reportedly secondary to previous lymphoma infiltration to the liver and R-CHOP chemotherapy.   Radiological evidence of cirrhosis and portal hypertension, with massive splenomegaly and esophageal varices.   INR is 1.86. Hep C antibody is negative. Bili Rubin rising to 1.6 on 11/2.  Will recheck TB on 11/6.  Constipation:   Resolved.  On bowel regimen with Senna and Miralax.  Patient's last BM was on 11/2  Pancytopenia:   Likely multifactorial from bone marrow suppression from AIDS/cirrhosis and hypersplenism.  Labs show stable leukopenia, anemia and thrombocytopenia.  Will continue to monitor as needed while patient is in hospital.  Severe protein calorie malnutrition: Continue with diet supplements after discharge   History large B-cell lymphoma 2009:   Metastasis to the back. Was treated with chemotherapy in 2009.   Bone marrow biopsy done on 11/02/2013 in the [facility], showed no evidence of recurrent disease.     DVT Prophylaxis:  Lovenox Code Status: Full Family Communication: Patient is AAOx3.  Will talk with Mildred today on [phone] Disposition Plan: SNF when  appropriate.  After neurosurgery re-evaluation.   Consultants:  ID  Neurosurgery.  Procedures:  None  Antibiotics:  Micafungin 10/17>> 04/10/2014  Daptomycin continued since discharge last  time >> 04/10/2014  Objective: Filed Vitals:   04/11/14 1533 04/11/14 2240 04/12/14 0607 04/12/14 1344  BP: 122/62 126/69 133/66 118/60  Pulse: 101 93 96 94  Temp: 98.9 F (37.2 C) 98.3 F (36.8 C) 98.3 F (36.8 C) 98.1 F (36.7 C)  TempSrc: Oral Oral Oral   Resp: _0 Height:      Weight:      SpO2: 95% 96% 95% 93%    Intake/Output Summary (Last 24 hours) at 04/12/14 1502 Last data filed at 04/12/14 1300  Gross per 24 hour  Intake     10 ml  Output    200 ml  Net   -190 ml   Filed Weights   03/23/14 1241 03/24/14 0057 03/30/14 0743  Weight: 74.844 kg (165 lb) 79.198 kg (174 lb 9.6 oz) 77.9 kg (171 lb 11.8 oz)    Exam: General: A&O, calm, playing with his new cell phone, POA, Marion at bedside. HEENT:  EOMI, Anicteic Sclera, MMM.  Neck: Supple, no JVD, no masses  Cardiovascular: RRR, S1 S2 auscultated, no rubs, murmurs or gallops.   Respiratory: Clear to auscultation bilaterally with equal chest rise Abdomen: Soft, nontender, nondistended, + bowel sounds.  Patient refuses to move to allow me to further examine him. Extremities: warm dry without cyanosis clubbing or edema. No obvious sign of injury to the left hip.  Ice pack on the site. Skin: Per RN-stage 2 pressure ulceration on sacrum.    Data Reviewed: Basic Metabolic Panel:  Recent Labs Lab 04/09/14 0500 04/11/14 0522 04/11/14 1220  NA 131* 132* 134*  K 3.9 3.7 3.8  CL 101 103 103  CO2 _1 GLUCOSE 70 93 71  BUN 3* 5* 4*  CREATININE 0.45* 0.43* 0.45*  CALCIUM 8.2* 8.2* 8.3*   Liver Function Tests:  Recent Labs Lab 04/09/14 0500 04/11/14 1220  AST 31 29  ALT 15 16  ALKPHOS 91 96  BILITOT 1.6* 1.7*  PROT 7.0 7.4  ALBUMIN 1.9* 2.0*   CBC:  Recent Labs Lab 04/09/14 0500  WBC 2.5*  NEUTROABS 1.2*  HGB 8.0*  HCT 23.8*  MCV 94.8  PLT 104*   Cardiac Enzymes:  Recent Labs Lab 04/08/14 0445 04/09/14 0500 04/10/14 0521 04/11/14 1550  CKTOTAL 223 241* 127  --   TROPONINI   --   --   --  <0.30    Studies: Dg Hip Complete Left  04/11/2014   CLINICAL DATA:  Patient felt pop in left hip with left hip pain. History of spinal infection.  EXAM: LEFT HIP - COMPLETE 2+ VIEW  COMPARISON:  CT 03/23/2014 and plain film 11/12/2013  FINDINGS: There are minimal degenerative changes of the right hip. There is no evidence of osteolysis. There is no fracture or dislocation. Mild degenerative change of the lumbar spine.  IMPRESSION: No acute findings.  Mild degenerative change of the right hip.   Electronically Signed   By: Marin Olp M.D.   On: 04/11/2014 07:59    Scheduled Meds: . atovaquone  750 mg Oral Daily  . buPROPion  300 mg Oral Daily  . clonazePAM  1 mg Oral QHS  . Darunavir Ethanolate  800 mg Oral Q breakfast  . emtricitabine-tenofovir  1 tablet Oral QHS  . enoxaparin (LOVENOX) injection  40 mg Subcutaneous  Q24H  . feeding supplement (ENSURE COMPLETE)  237 mL Oral BID BM  . feeding supplement (ENSURE)  1 Container Oral TID BM  . gabapentin  400 mg Oral TID  . morphine  60 mg Oral Q12H  . polyethylene glycol  17 g Oral Daily  . ritonavir  100 mg Oral Q breakfast  . senna-docusate  2 tablet Oral Daily  . sucralfate  1 g Oral TID WC   Continuous Infusions: . sodium chloride 10 mL/hr at 04/11/14 2317    Principal Problem:   Diskitis Active Problems:   AIDS   Cirrhosis, non-alcoholic   Pancytopenia   Protein-calorie malnutrition, severe   Abscess in epidural space of lumbar spine   Infectious discitis   Discitis of lumbar region   HIV (human immunodeficiency virus infection)   Osteomyelitis of lumbar spine   Fungal osteomyelitis   VRE (vancomycin resistant enterococcus) culture positive    Imogene Burn, PA-C [facility] Pager [phone]. If 7PM-7AM, please contact night-coverage at [email or web address], password [facility] 04/12/2014, 3:02 PM  LOS: 20 days

## 2014-04-13 LAB — COMPREHENSIVE METABOLIC PANEL
ALBUMIN: 1.9 g/dL — AB (ref 3.5–5.2)
ALT: 16 U/L (ref 0–53)
AST: 44 U/L — ABNORMAL HIGH (ref 0–37)
Alkaline Phosphatase: 91 U/L (ref 39–117)
Anion gap: 8 (ref 5–15)
BUN: 7 mg/dL (ref 6–23)
CO2: 24 mEq/L (ref 19–32)
Calcium: 8.1 mg/dL — ABNORMAL LOW (ref 8.4–10.5)
Chloride: 102 mEq/L (ref 96–112)
Creatinine, Ser: 0.45 mg/dL — ABNORMAL LOW (ref 0.50–1.35)
GFR calc Af Amer: >90 mL/min (ref >90)
GFR calc non Af Amer: >90 mL/min (ref >90)
Glucose, Bld: 100 mg/dL — ABNORMAL HIGH (ref 70–99)
Potassium: 3.7 mEq/L (ref 3.7–5.3)
SODIUM: 134 meq/L — AB (ref 137–147)
TOTAL PROTEIN: 7.1 g/dL (ref 6.0–8.3)
Total Bilirubin: 1.8 mg/dL — ABNORMAL HIGH (ref 0.3–1.2)

## 2014-04-13 LAB — CBC
HCT: 24 % — ABNORMAL LOW (ref 39.0–52.0)
HEMOGLOBIN: 7.9 g/dL — AB (ref 13.0–17.0)
MCH: 31.5 pg (ref 26.0–34.0)
MCHC: 32.9 g/dL (ref 30.0–36.0)
MCV: 95.6 fL (ref 78.0–100.0)
Platelets: 100 10*3/uL — ABNORMAL LOW (ref 150–400)
RBC: 2.51 MIL/uL — ABNORMAL LOW (ref 4.22–5.81)
RDW: 15.8 % — AB (ref 11.5–15.5)
WBC: 2.6 10*3/uL — ABNORMAL LOW (ref 4.0–10.5)

## 2014-04-13 LAB — PROTIME-INR
INR: 1.82 — ABNORMAL HIGH (ref 0.00–1.49)
Prothrombin Time: 21.2 seconds — ABNORMAL HIGH (ref 11.6–15.2)

## 2014-04-13 MED ORDER — PHYTONADIONE 5 MG PO TABS
10.0000 mg | ORAL_TABLET | Freq: Every day | ORAL | Status: DC
  Administered 2014-04-13 – 2014-04-15 (×3): 10 mg via ORAL
  Filled 2014-04-13 (×4): qty 2

## 2014-04-13 MED ORDER — ALPRAZOLAM 0.25 MG PO TABS
0.2500 mg | ORAL_TABLET | Freq: Two times a day (BID) | ORAL | Status: DC | PRN
  Administered 2014-04-20: 0.25 mg via ORAL
  Filled 2014-04-13: qty 1

## 2014-04-13 NOTE — Consult Note (Addendum)
WOC wound consult note Reason for Consult: Consult requested for sacrum. Appearance is atypical for a pressure ulcer: dry and scabbed and evolving into partial thickness skin loss in patchy areas, because location site is over the sacral bone I will classify it a stage 2 at this time.  Pt is thin and has multiple systemic factors which can impair healing including AIDS according to the EMR. Measurement:3X2X.1cm Wound AOZ:HYQM and dry Drainage (amount, consistency, odor) no odor or drainage Periwound: dry scabbed skin is flaking off in some locations Dressing procedure/placement/frequency: Foam dressing to protect and promote healing.  Discussed plan of care with pt and he verbalized understanding. Air mattress in place to reduce pressure. Please re-consult if further assistance is needed.  Thank-you,  Julien Girt MSN, [place], [place], [place], [place]

## 2014-04-13 NOTE — Progress Notes (Signed)
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 He was admitted to [facility], from [facility] rehabilitation facility, patient was recently in the hospital for treatment of L3-L4 discitis and abscess at the L5-S1 level. He was discharged on cefepime, daptomycin and fluconazole. In the nursing facility patient complained about worsening back pain, brought back to the ED and repeat MRI of the back showed worsening of L3-4 discitis and new discitis at the level of T10-11. He was admitted to the hospital for further evaluation, started on daptomycin and micafungin per ID. On 11/3 ID stopped his antbiotic and antfungal therapy and requested that Neurosurgery re-evaluate the patient and attempt to obtain deep cultures in order to target treatment.  Left-sided L5-S1 laminectomy and microdiscectomy originally planned for 04/12/2014, but delayed due to elevated INR.  Will plan for vitamin K and FFP.  Neurosurgery rescheduled for Monday afternoon if INR is 1.2 or less.   Assessment/Plan: Acute discitis:  Recent h/o L3-4 discitis and L5-S1 abscess. Came in with new discitis at T10-11, worsening L5-S1 abscess, and persistent L3-4 discitis. Patient discharged on cefepime, daptomycin and fluconazole. Seen by ID here and placed on Micafungin and daptomycin 8 mg/kg. These were subsequently discontinued on 11/3 in preparation for deep biopsy.    Left-sided L5-S1 laminectomy and microdiscectomy scheduled for 04/16/2014. If INR is 1.2 or less.  Rosaria Ferries, [place], requests an update with the time of surgery so that she may be here at the hospital during the procedure.  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  Daily INR.  Goal is 1.2 or less on Monday morning.  Nausea with intermittent vomiting:   Resolved.  Suspect releated to narcotics and constipation. Now having BM's on senna.  Tolerating soft diet well.   Back Pain:   Secondary to diskitis with a component of anxiety.   On Klonopin QHS.Pain is overall better 6/10. Back brace that was added for support is helping him become more mobile.   11/3 patient reports left hip pain is improved with KPAD. (thanks physical therapy!)  Pain/Anxiety management regimen:   Continue MS Contin 60 mg, Dilaudid 1 mg IV prn,  MSIR 15 mg prn. Has Xanax 0.25 mg bid prn in addition to klonopin qhs.  AIDS/HIV infection:   Absolute CD4 count is 40 on 01/27/14.   Viral load is undetectable.  Patient is on Truvada, Prezista and Norvir. Azithromycin for prophylaxis.   Infectious Disease is on board appreciate their assistance and recommendations.  Cirrhosis:   Reportedly secondary to previous lymphoma infiltration to the liver and R-CHOP chemotherapy.   Radiological evidence of cirrhosis and portal hypertension, with massive splenomegaly and esophageal varices.   INR is 1.86. Hep C antibody is negative. Bili Rubin rising to 1.8  Pressure ulceration at sacrum  Will ask WOC for recommendations as this patient is so immunocompromised.  Air Overlay mattress in place.  Constipation:   Resolved.  On bowel regimen with Senna and Miralax.  Patient's last BM was on 11/2  Pancytopenia:   Likely multifactorial from bone marrow suppression from AIDS/cirrhosis and hypersplenism.  Labs show stable leukopenia, anemia and thrombocytopenia.  Will continue to monitor as needed while patient is in hospital.  Severe protein  calorie malnutrition: Continue with diet supplements after discharge   History large B-cell lymphoma 2009:   Metastasis to the back. Was treated with chemotherapy in 2009.   Bone marrow biopsy done on  11/02/2013 in the [facility], showed no evidence of recurrent disease.  DVT Prophylaxis:  Lovenox Code Status: Full Family Communication: Patient is AAOx3. Rosaria Ferries, [place], at bedside.   Disposition Plan: SNF when appropriate.  After neurosurgery re-evaluation.   Consultants:  ID  Neurosurgery.  Procedures:  None  Antibiotics:  Micafungin 10/17>> 04/10/2014  Daptomycin continued since discharge last time >> 04/10/2014  Objective: Filed Vitals:   04/12/14 0607 04/12/14 1344 04/12/14 2146 04/13/14 0635  BP: 133/66 118/60 96/62 109/61  Pulse: 96 94 100 92  Temp: 98.3 F (36.8 C) 98.1 F (36.7 C) 98.5 F (36.9 C) 98.4 F (36.9 C)  TempSrc: Oral  Oral Oral  Resp: 16 16 20 15   Height:      Weight:      SpO2: 95% 93% 95% 99%    Intake/Output Summary (Last 24 hours) at 04/13/14 0952 Last data filed at 04/12/14 1300  Gross per 24 hour  Intake      0 ml  Output      0 ml  Net      0 ml   Filed Weights   03/23/14 1241 03/24/14 0057 03/30/14 0743  Weight: 74.844 kg (165 lb) 79.198 kg (174 lb 9.6 oz) 77.9 kg (171 lb 11.8 oz)    Exam: General: A&O, calm, playing with his new cell phone, POA, Marion at bedside. HEENT:  EOMI, Anicteic Sclera, MMM.  Neck: Supple, no JVD, no masses  Cardiovascular: RRR, S1 S2 auscultated, no rubs, murmurs or gallops.   Respiratory: Clear to auscultation bilaterally with equal chest rise Abdomen: Soft, nontender, nondistended, + bowel sounds.  Patient refuses to move to allow me to further examine him. Extremities: warm dry without cyanosis clubbing or edema. No obvious sign of injury to the left hip.  Ice pack on the site. Skin: pressure ulceration on sacrum. 2"x2" area of scabbed appearing skin.   Data Reviewed: Basic Metabolic Panel:  Recent Labs Lab 04/09/14 0500 04/11/14 0522 04/11/14 1220 04/13/14 0550  NA 131* 132* 134* 134*  K 3.9 3.7 3.8 3.7  CL 101 103 103 102  CO2 23 23 22 24   GLUCOSE 70 93 71 100*  BUN 3* 5* 4* 7   CREATININE 0.45* 0.43* 0.45* 0.45*  CALCIUM 8.2* 8.2* 8.3* 8.1*   Liver Function Tests:  Recent Labs Lab 04/09/14 0500 04/11/14 1220 04/13/14 0550  AST 31 29 44*  ALT 15 16 16   ALKPHOS 91 96 91  BILITOT 1.6* 1.7* 1.8*  PROT 7.0 7.4 7.1  ALBUMIN 1.9* 2.0* 1.9*   CBC:  Recent Labs Lab 04/09/14 0500 04/13/14 0550  WBC 2.5* 2.6*  NEUTROABS 1.2*  --   HGB 8.0* 7.9*  HCT 23.8* 24.0*  MCV 94.8 95.6  PLT 104* 100*   Cardiac Enzymes:  Recent Labs Lab 04/08/14 0445 04/09/14 0500 04/10/14 0521 04/11/14 1550  CKTOTAL 223 241* 127  --   TROPONINI  --   --   --  <0.30    Studies: No results found.  Scheduled Meds: . atovaquone  750 mg Oral Daily  . buPROPion  300 mg Oral Daily  . clonazePAM  1 mg Oral QHS  . Darunavir Ethanolate  800 mg Oral Q breakfast  . emtricitabine-tenofovir  1 tablet Oral QHS  . enoxaparin (LOVENOX) injection  40 mg Subcutaneous Q24H  . feeding supplement (ENSURE COMPLETE)  237 mL Oral BID BM  . feeding supplement (ENSURE)  1 Container Oral TID BM  . gabapentin  400 mg Oral TID  . morphine  60 mg Oral Q12H  . phytonadione  10 mg Oral Daily  . polyethylene glycol  17 g Oral Daily  . ritonavir  100 mg Oral Q breakfast  . senna-docusate  2 tablet Oral Daily  . sucralfate  1 g Oral TID WC   Continuous Infusions: . sodium chloride 10 mL/hr at 04/12/14 2112    Principal Problem:   Diskitis Active Problems:   AIDS   Cirrhosis, non-alcoholic   Pancytopenia   Protein-calorie malnutrition, severe   Abscess in epidural space of lumbar spine   Infectious discitis   Discitis of lumbar region   HIV (human immunodeficiency virus infection)   Osteomyelitis of lumbar spine   Fungal osteomyelitis   VRE (vancomycin resistant enterococcus) culture positive   Pain, hip    Imogene Burn, PA-C [facility] Pager [phone]. If 7PM-7AM, please contact night-coverage at [email or web address], password [facility] 04/13/2014, 9:52 AM  LOS: 21 days

## 2014-04-13 NOTE — Clinical Social Work Note (Signed)
CSW met with patient at bedside to check in. Patient resting comfortably in recliner. CSW inquired about how patient is feeling and dealing with the prolonged hospital stay. Patient states that he is tired and just ready to get this surgery out of the way. Patient does appear tired and exhibits flat affect, not much different from previous visits. CSW updated patient on discharge plan and the work being done to get him into [facility]. Patient did have questions regarding his surgery, specifically how his incision will be closed. CSW explained that the patient will need to ask his physician/surgeon this questions. Patient reports no other CSW related needs at this time. CSW will continue to follow for emotional support and DC needs. CSW notes plan is for surgery on Monday. Report left for weekend coverage.   Liz Beach MSW, [place], [place], [phone]

## 2014-04-14 LAB — PROTIME-INR
INR: 1.72 — ABNORMAL HIGH (ref 0.00–1.49)
Prothrombin Time: 20.3 seconds — ABNORMAL HIGH (ref 11.6–15.2)

## 2014-04-14 LAB — TYPE AND SCREEN
ABO/RH(D): A POS
ANTIBODY SCREEN: NEGATIVE

## 2014-04-14 NOTE — Progress Notes (Signed)
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 He was admitted to [facility], from [facility] rehabilitation facility, patient was recently in the hospital for treatment of L3-L4 discitis and abscess at the L5-S1 level. He was discharged on cefepime, daptomycin and fluconazole. In the nursing facility patient complained about worsening back pain, brought back to the ED and repeat MRI of the back showed worsening of L3-4 discitis and new discitis at the level of T10-11. He was admitted to the hospital for further evaluation, started on daptomycin and micafungin per ID. On 11/3 ID stopped his antbiotic and antfungal therapy and requested that Neurosurgery re-evaluate the patient and attempt to obtain deep cultures in order to target treatment.  Left-sided L5-S1 laminectomy and microdiscectomy originally planned for 04/12/2014, but delayed due to elevated INR.  Will plan for vitamin K and FFP.  Neurosurgery rescheduled for Monday afternoon if INR is 1.2 or less.   Assessment/Plan: Acute discitis:  Recent h/o L3-4 discitis and L5-S1 abscess. Came in with new discitis at T10-11, worsening L5-S1 abscess, and persistent L3-4 discitis. Patient discharged on cefepime, daptomycin and fluconazole. Seen by ID here and placed on Micafungin and daptomycin 8 mg/kg. These were subsequently discontinued on 11/3 in preparation for deep biopsy.    Left-sided L5-S1 laminectomy and microdiscectomy scheduled for 04/16/2014 if INR is 1.2 or less.  Rosaria Ferries, [place], requests an update with the time of surgery so that she may be here at the hospital during the procedure.  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  Daily INR. Goal is 1.2 or less on Monday morning.  INR gradually improving this morning  Nausea with intermittent vomiting:   Resolved.  Suspect releated to narcotics and constipation. Now having BM's on senna.  Tolerating soft diet well.   Back Pain:   Secondary to diskitis with a component of anxiety.   On Klonopin QHS.Pain is overall better 6/10. Back brace that was added for support is helping him become more mobile.   11/3 patient reports left hip pain is improved with KPAD. (thanks physical therapy!)  Pain/Anxiety management regimen: Continue MS Contin 60 mg, Dilaudid 1 mg IV prn,  MSIR 15 mg prn. Has Xanax 0.25 mg bid prn in addition to klonopin qhs.  AIDS/HIV infection:   Absolute CD4 count is 40 on 01/27/14.   Viral load is undetectable.  Patient is on Truvada, Prezista and Norvir. Azithromycin for prophylaxis.   Infectious Disease is on board appreciate their assistance and recommendations.  Cirrhosis:   Reportedly secondary to previous lymphoma infiltration to the liver and R-CHOP chemotherapy.   Radiological evidence of cirrhosis and portal hypertension, with massive splenomegaly and esophageal varices.   With evidence of decompensation given coagulopathy  Pressure ulceration at sacrum  Air Overlay mattress in place.  Appreciate wound care consult  Constipation:   Resolved.  On bowel regimen with Senna and Miralax.  Patient's last BM was on 11/2  Pancytopenia:   Likely multifactorial from bone marrow suppression from AIDS/cirrhosis and hypersplenism.  Labs show stable leukopenia, anemia and thrombocytopenia.  Will continue to monitor as needed while patient is in hospital.  Severe protein calorie malnutrition: Continue with diet supplements after discharge   History  large B-cell lymphoma 2009:   Metastasis to the back. Was treated with chemotherapy in 2009.   Bone marrow biopsy done on 11/02/2013 in the [facility], showed  no evidence of recurrent disease.  DVT Prophylaxis:  Lovenox  Code Status: Full Family Communication: discussed with patient this morning Disposition Plan: SNF when appropriate.  After neurosurgery re-evaluation.   Consultants:  ID  Neurosurgery.  Procedures:  None  Antibiotics:  Micafungin 10/17>> 04/10/2014  Daptomycin continued since discharge last time >> 04/10/2014  Objective: Filed Vitals:   04/12/14 2146 04/13/14 0635 04/13/14 1552 04/14/14 0546  BP: 96/62 109/61 113/66 116/59  Pulse: 100 92 95 88  Temp: 98.5 F (36.9 C) 98.4 F (36.9 C) 99 F (37.2 C) 97.5 F (36.4 C)  TempSrc: Oral Oral Oral Oral  Resp: 20 15 18 4   Height:      Weight:      SpO2: 95% 99% 95% 95%    Intake/Output Summary (Last 24 hours) at 04/14/14 1048 Last data filed at 04/14/14 0600  Gross per 24 hour  Intake   1454 ml  Output      0 ml  Net   1454 ml   Filed Weights   03/23/14 1241 03/24/14 0057 03/30/14 0743  Weight: 74.844 kg (165 lb) 79.198 kg (174 lb 9.6 oz) 77.9 kg (171 lb 11.8 oz)    Exam: General: in no apparent distress HEENT: no scleral icterus Neck: Supple, no JVD, no masses  Cardiovascular: RRR, S1 S2 auscultated, no rubs, murmurs or gallops.   Respiratory: Clear to auscultation bilaterally with equal chest rise Abdomen: Soft, nontender, nondistended, + bowel sounds Extremities: no edema   Data Reviewed: Basic Metabolic Panel:  Recent Labs Lab 04/09/14 0500 04/11/14 0522 04/11/14 1220 04/13/14 0550  NA 131* 132* 134* 134*  K 3.9 3.7 3.8 3.7  CL 101 103 103 102  CO2 23 23 22 24   GLUCOSE 70 93 71 100*  BUN 3* 5* 4* 7  CREATININE 0.45* 0.43* 0.45* 0.45*  CALCIUM 8.2* 8.2* 8.3* 8.1*   Liver Function Tests:  Recent Labs Lab 04/09/14 0500 04/11/14 1220 04/13/14 0550  AST 31 29 44*  ALT 15 16 16   ALKPHOS 91 96 91  BILITOT 1.6* 1.7* 1.8*  PROT 7.0 7.4 7.1  ALBUMIN 1.9* 2.0* 1.9*   CBC:  Recent Labs Lab 04/09/14 0500 04/13/14 0550  WBC  2.5* 2.6*  NEUTROABS 1.2*  --   HGB 8.0* 7.9*  HCT 23.8* 24.0*  MCV 94.8 95.6  PLT 104* 100*   Cardiac Enzymes:  Recent Labs Lab 04/08/14 0445 04/09/14 0500 04/10/14 0521 04/11/14 1550  CKTOTAL 223 241* 127  --   TROPONINI  --   --   --  <0.30    Studies: No results found.  Scheduled Meds: . atovaquone  750 mg Oral Daily  . buPROPion  300 mg Oral Daily  . clonazePAM  1 mg Oral QHS  . Darunavir Ethanolate  800 mg Oral Q breakfast  . emtricitabine-tenofovir  1 tablet Oral QHS  . enoxaparin (LOVENOX) injection  40 mg Subcutaneous Q24H  . feeding supplement (ENSURE COMPLETE)  237 mL Oral BID BM  . feeding supplement (ENSURE)  1 Container Oral TID BM  . gabapentin  400 mg Oral TID  . morphine  60 mg Oral Q12H  . phytonadione  10 mg Oral Daily  . polyethylene glycol  17 g Oral Daily  . ritonavir  100 mg Oral Q breakfast  . senna-docusate  2  tablet Oral Daily  . sucralfate  1 g Oral TID WC   Continuous Infusions: . sodium chloride 10 mL/hr at 04/12/14 2112    Principal Problem:   Diskitis Active Problems:   AIDS   Cirrhosis, non-alcoholic   Pancytopenia   Protein-calorie malnutrition, severe   Abscess in epidural space of lumbar spine   Infectious discitis   Discitis of lumbar region   HIV (human immunodeficiency virus infection)   Osteomyelitis of lumbar spine   Fungal osteomyelitis   VRE (vancomycin resistant enterococcus) culture positive   Pain, hip  Time spent: 15 minutes  Harnoor Kohles M. Cruzita Lederer, MD [facility] [phone]  If 7PM-7AM, please contact night-coverage at [email or web address], password [facility] 04/14/2014, 10:48 AM  LOS: 22 days

## 2014-04-15 LAB — BASIC METABOLIC PANEL
Anion gap: 7 (ref 5–15)
BUN: 7 mg/dL (ref 6–23)
CALCIUM: 8.4 mg/dL (ref 8.4–10.5)
CO2: 24 mEq/L (ref 19–32)
Chloride: 104 mEq/L (ref 96–112)
Creatinine, Ser: 0.71 mg/dL (ref 0.50–1.35)
GFR calc Af Amer: >90 mL/min (ref >90)
GFR calc non Af Amer: >90 mL/min (ref >90)
GLUCOSE: 79 mg/dL (ref 70–99)
Potassium: 3.6 mEq/L — ABNORMAL LOW (ref 3.7–5.3)
Sodium: 135 mEq/L — ABNORMAL LOW (ref 137–147)

## 2014-04-15 LAB — PROTIME-INR
INR: 1.7 — ABNORMAL HIGH (ref 0.00–1.49)
Prothrombin Time: 20.2 seconds — ABNORMAL HIGH (ref 11.6–15.2)

## 2014-04-15 LAB — CBC
HEMATOCRIT: 23.8 % — AB (ref 39.0–52.0)
Hemoglobin: 7.9 g/dL — ABNORMAL LOW (ref 13.0–17.0)
MCH: 31.6 pg (ref 26.0–34.0)
MCHC: 33.2 g/dL (ref 30.0–36.0)
MCV: 95.2 fL (ref 78.0–100.0)
Platelets: 93 10*3/uL — ABNORMAL LOW (ref 150–400)
RBC: 2.5 MIL/uL — ABNORMAL LOW (ref 4.22–5.81)
RDW: 15.9 % — AB (ref 11.5–15.5)
WBC: 2.8 10*3/uL — ABNORMAL LOW (ref 4.0–10.5)

## 2014-04-15 LAB — CK: Total CK: 169 U/L (ref 7–232)

## 2014-04-15 NOTE — Plan of Care (Signed)
Problem: Discharge Progression Outcomes Goal: Pain controlled with appropriate interventions Outcome: Progressing     

## 2014-04-15 NOTE — Progress Notes (Signed)
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 He was admitted to [facility], from [facility] rehabilitation facility, patient was recently in the hospital for treatment of L3-L4 discitis and abscess at the L5-S1 level. He was discharged on cefepime, daptomycin and fluconazole. In the nursing facility patient complained about worsening back pain, brought back to the ED and repeat MRI of the back showed worsening of L3-4 discitis and new discitis at the level of T10-11. He was admitted to the hospital for further evaluation, started on daptomycin and micafungin per ID. On 11/3 ID stopped his antbiotic and antfungal therapy and requested that Neurosurgery re-evaluate the patient and attempt to obtain deep cultures in order to target treatment.  Left-sided L5-S1 laminectomy and microdiscectomy originally planned for 04/12/2014, but delayed due to elevated INR.  Will plan for vitamin K and FFP.  Neurosurgery rescheduled for Monday afternoon if INR is 1.2 or less.  Subjective - he is doing well this morning, no new complaints. Awaiting surgery tomorrow.  Assessment/Plan: Acute discitis:  Recent h/o L3-4 discitis and L5-S1 abscess. Came in with new discitis at T10-11, worsening L5-S1 abscess, and persistent L3-4 discitis. Patient discharged on cefepime, daptomycin and fluconazole. Seen by ID here and placed on Micafungin and daptomycin 8 mg/kg. These were subsequently discontinued on 11/3 in preparation for deep biopsy.    Left-sided L5-S1 laminectomy and microdiscectomy scheduled for 04/16/2014 if INR is 1.2 or less.  Rosaria Ferries, [place], requests an update with the time of surgery so that she may be here at the hospital during the procedure.  Coagulopathy:  Secondary to Cirrhosis from R-CHOP therapy  Will give Vit K 10  mg po daily, 2 units of FFP on Sunday evening prior to Surgery on Monday.  Daily INR. Goal is 1.2 or less on Monday morning.  INR 1.7 this morning, minimally improving with vitamin K I suspect due to low reserves of the hepatic function  Nausea with intermittent vomiting:   Resolved.  Suspect releated to narcotics and constipation. Now having BM's on senna.  Tolerating soft diet well.   Back Pain:   Secondary to diskitis with a component of anxiety.   On Klonopin QHS.Pain is overall better 6/10. Back brace that was added for support is helping him become more mobile.   11/3 patient reports left hip pain is improved with KPAD. (thanks physical therapy!)  Pain/Anxiety management regimen: Continue MS Contin 60 mg, Dilaudid 1 mg IV prn,  MSIR 15 mg prn. Has Xanax 0.25 mg bid prn in addition to klonopin qhs.  AIDS/HIV infection:   Absolute CD4 count is 40 on 01/27/14.   Viral load is undetectable.  Patient is on Truvada, Prezista and Norvir. Azithromycin for prophylaxis.   Infectious Disease is on board appreciate their assistance and recommendations.  Cirrhosis:   Reportedly secondary to previous lymphoma infiltration to the liver and R-CHOP chemotherapy.   Radiological evidence of cirrhosis and portal hypertension, with massive splenomegaly and esophageal varices.   With evidence of decompensation given coagulopathy  Pressure ulceration at sacrum  Air Overlay mattress in place.  Appreciate wound care consult  Constipation:   Resolved.  On bowel regimen with Senna and Miralax.  Patient's last BM was on 11/2  Pancytopenia:   Likely multifactorial from bone marrow suppression from AIDS/cirrhosis and hypersplenism.  Labs show stable leukopenia, anemia  and thrombocytopenia.  Will continue to monitor as needed while patient is in hospital.  Discontinue Lovenox today in preparation for surgery tomorrow  Severe protein calorie malnutrition: Continue with  diet supplements after discharge   History large B-cell lymphoma 2009:   Metastasis to the back. Was treated with chemotherapy in 2009.   Bone marrow biopsy done on 11/02/2013 in the [facility], showed no evidence of recurrent disease.  DVT Prophylaxis:  Lovenox; discontinued Lovenox today in preparation for surgery tomorrow also the patient is mildly thrombocytopenic. SCDs  Code Status: Full Family Communication: discussed with patient this morning Disposition Plan: SNF when appropriate.  After neurosurgery re-evaluation.   Consultants:  ID  Neurosurgery.  Procedures:  None  Antibiotics:  Micafungin 10/17>> 04/10/2014  Daptomycin continued since discharge last time >> 04/10/2014  Objective: Filed Vitals:   04/14/14 0546 04/14/14 1334 04/15/14 0014 04/15/14 0440  BP: 116/59 128/68 118/64 112/97  Pulse: 88 94 89 88  Temp: 97.5 F (36.4 C) 98.1 F (36.7 C) 97.8 F (36.6 C) 98 F (36.7 C)  TempSrc: Oral Oral Oral Oral  Resp: 4 20 18 20   Height:      Weight:      SpO2: 95% 97% 98% 97%    Intake/Output Summary (Last 24 hours) at 04/15/14 1021 Last data filed at 04/15/14 0548  Gross per 24 hour  Intake    920 ml  Output   1180 ml  Net   -260 ml   Filed Weights   03/23/14 1241 03/24/14 0057 03/30/14 0743  Weight: 74.844 kg (165 lb) 79.198 kg (174 lb 9.6 oz) 77.9 kg (171 lb 11.8 oz)    Exam: General: in no apparent distress HEENT: no scleral icterus Neck: Supple, no JVD, no masses  Cardiovascular: RRR, S1 S2 auscultated, no rubs, murmurs or gallops.   Respiratory: Clear to auscultation bilaterally with equal chest rise Abdomen: Soft, nontender, nondistended, + bowel sounds Extremities: no edema   Data Reviewed: Basic Metabolic Panel:  Recent Labs Lab 04/09/14 0500 04/11/14 0522 04/11/14 1220 04/13/14 0550 04/15/14 0508  NA 131* 132* 134* 134* 135*  K 3.9 3.7 3.8 3.7 3.6*  CL 101 103 103 102 104  CO2 23 23 22 24 24   GLUCOSE 70 93 71 100* 79  BUN  3* 5* 4* 7 7  CREATININE 0.45* 0.43* 0.45* 0.45* 0.71  CALCIUM 8.2* 8.2* 8.3* 8.1* 8.4   Liver Function Tests:  Recent Labs Lab 04/09/14 0500 04/11/14 1220 04/13/14 0550  AST 31 29 44*  ALT 15 16 16   ALKPHOS 91 96 91  BILITOT 1.6* 1.7* 1.8*  PROT 7.0 7.4 7.1  ALBUMIN 1.9* 2.0* 1.9*   CBC:  Recent Labs Lab 04/09/14 0500 04/13/14 0550 04/15/14 0508  WBC 2.5* 2.6* 2.8*  NEUTROABS 1.2*  --   --   HGB 8.0* 7.9* 7.9*  HCT 23.8* 24.0* 23.8*  MCV 94.8 95.6 95.2  PLT 104* 100* 93*   Cardiac Enzymes:  Recent Labs Lab 04/09/14 0500 04/10/14 0521 04/11/14 1550 04/15/14 0508  CKTOTAL 241* 127  --  169  TROPONINI  --   --  <0.30  --     Studies: No results found.  Scheduled Meds: . atovaquone  750 mg Oral Daily  . buPROPion  300 mg Oral Daily  . clonazePAM  1 mg Oral QHS  . Darunavir Ethanolate  800 mg Oral Q breakfast  . emtricitabine-tenofovir  1 tablet Oral QHS  . enoxaparin (LOVENOX) injection  40 mg Subcutaneous Q24H  .  feeding supplement (ENSURE COMPLETE)  237 mL Oral BID BM  . feeding supplement (ENSURE)  1 Container Oral TID BM  . gabapentin  400 mg Oral TID  . morphine  60 mg Oral Q12H  . phytonadione  10 mg Oral Daily  . polyethylene glycol  17 g Oral Daily  . ritonavir  100 mg Oral Q breakfast  . senna-docusate  2 tablet Oral Daily  . sucralfate  1 g Oral TID WC   Continuous Infusions: . sodium chloride 10 mL/hr at 04/15/14 0548    Principal Problem:   Diskitis Active Problems:   AIDS   Cirrhosis, non-alcoholic   Pancytopenia   Protein-calorie malnutrition, severe   Abscess in epidural space of lumbar spine   Infectious discitis   Discitis of lumbar region   HIV (human immunodeficiency virus infection)   Osteomyelitis of lumbar spine   Fungal osteomyelitis   VRE (vancomycin resistant enterococcus) culture positive   Pain, hip  Time spent: 15 minutes  Laketa Sandoz M. Cruzita Lederer, MD [facility] [phone]  If 7PM-7AM, please  contact night-coverage at [email or web address], password [facility] 04/15/2014, 10:21 AM  LOS: 23 days

## 2014-04-16 ENCOUNTER — Encounter (HOSPITAL_COMMUNITY): Payer: Self-pay | Admitting: Anesthesiology

## 2014-04-16 ENCOUNTER — Encounter (HOSPITAL_COMMUNITY)
Admission: EM | Disposition: A | Discharge status: Skilled Nursing Facility | Payer: Self-pay | Source: Home / Self Care | Attending: Internal Medicine

## 2014-04-16 ENCOUNTER — Inpatient Hospital Stay (HOSPITAL_COMMUNITY): Payer: Medicaid Other | Admitting: Anesthesiology

## 2014-04-16 ENCOUNTER — Inpatient Hospital Stay (HOSPITAL_COMMUNITY): Payer: Medicaid Other

## 2014-04-16 HISTORY — PX: LUMBAR LAMINECTOMY/DECOMPRESSION MICRODISCECTOMY: SHX5026

## 2014-04-16 LAB — CBC
HEMATOCRIT: 21.7 % — AB (ref 39.0–52.0)
Hemoglobin: 7.3 g/dL — ABNORMAL LOW (ref 13.0–17.0)
MCH: 32.2 pg (ref 26.0–34.0)
MCHC: 33.6 g/dL (ref 30.0–36.0)
MCV: 95.6 fL (ref 78.0–100.0)
Platelets: 97 10*3/uL — ABNORMAL LOW (ref 150–400)
RBC: 2.27 MIL/uL — ABNORMAL LOW (ref 4.22–5.81)
RDW: 16.3 % — AB (ref 11.5–15.5)
WBC: 2 10*3/uL — ABNORMAL LOW (ref 4.0–10.5)

## 2014-04-16 LAB — BASIC METABOLIC PANEL
Anion gap: 8 (ref 5–15)
BUN: 7 mg/dL (ref 6–23)
CO2: 24 meq/L (ref 19–32)
CREATININE: 0.61 mg/dL (ref 0.50–1.35)
Calcium: 8.3 mg/dL — ABNORMAL LOW (ref 8.4–10.5)
Chloride: 103 mEq/L (ref 96–112)
GFR calc Af Amer: >90 mL/min (ref >90)
Glucose, Bld: 90 mg/dL (ref 70–99)
Potassium: 3.4 mEq/L — ABNORMAL LOW (ref 3.7–5.3)
Sodium: 135 mEq/L — ABNORMAL LOW (ref 137–147)

## 2014-04-16 LAB — SURGICAL PCR SCREEN
MRSA, PCR: NEGATIVE
Staphylococcus aureus: NEGATIVE

## 2014-04-16 LAB — PROTIME-INR
INR: 1.6 — AB (ref 0.00–1.49)
INR: 1.51 — ABNORMAL HIGH (ref 0.00–1.49)
PROTHROMBIN TIME: 19.2 s — AB (ref 11.6–15.2)
Prothrombin Time: 18.3 seconds — ABNORMAL HIGH (ref 11.6–15.2)

## 2014-04-16 SURGERY — LUMBAR LAMINECTOMY/DECOMPRESSION MICRODISCECTOMY 1 LEVEL
Anesthesia: General | Site: Back | Laterality: Left

## 2014-04-16 MED ORDER — NEOSTIGMINE METHYLSULFATE 10 MG/10ML IV SOLN
INTRAVENOUS | Status: DC | PRN
  Administered 2014-04-16: 4 mg via INTRAVENOUS

## 2014-04-16 MED ORDER — BUPIVACAINE HCL (PF) 0.25 % IJ SOLN
INTRAMUSCULAR | Status: DC | PRN
  Administered 2014-04-16: 6 mL

## 2014-04-16 MED ORDER — VITAMIN K1 10 MG/ML IJ SOLN
10.0000 mg | Freq: Once | INTRAVENOUS | Status: AC
  Administered 2014-04-16: 10 mg via INTRAVENOUS
  Filled 2014-04-16: qty 1

## 2014-04-16 MED ORDER — CEFAZOLIN SODIUM-DEXTROSE 2-3 GM-% IV SOLR
2.0000 g | Freq: Three times a day (TID) | INTRAVENOUS | Status: AC
  Administered 2014-04-17 (×2): 2 g via INTRAVENOUS
  Filled 2014-04-16 (×3): qty 50

## 2014-04-16 MED ORDER — PROPOFOL 10 MG/ML IV BOLUS
INTRAVENOUS | Status: DC | PRN
  Administered 2014-04-16: 150 mg via INTRAVENOUS

## 2014-04-16 MED ORDER — SODIUM CHLORIDE 0.9 % IV SOLN: Freq: Once | INTRAVENOUS | Status: DC

## 2014-04-16 MED ORDER — DOCUSATE SODIUM 100 MG PO CAPS
100.0000 mg | ORAL_CAPSULE | Freq: Two times a day (BID) | ORAL | Status: DC
  Administered 2014-04-16 – 2014-04-23 (×14): 100 mg via ORAL
  Filled 2014-04-16 (×19): qty 1

## 2014-04-16 MED ORDER — SODIUM CHLORIDE 0.9 % IJ SOLN: 3.0000 mL | INTRAMUSCULAR | Status: DC | PRN

## 2014-04-16 MED ORDER — SODIUM CHLORIDE 0.9 % IJ SOLN: 3.0000 mL | Freq: Two times a day (BID) | INTRAMUSCULAR | Status: DC

## 2014-04-16 MED ORDER — MIDAZOLAM HCL 5 MG/5ML IJ SOLN
INTRAMUSCULAR | Status: DC | PRN
  Administered 2014-04-16: 2 mg via INTRAVENOUS

## 2014-04-16 MED ORDER — ACETAMINOPHEN 650 MG RE SUPP: 650.0000 mg | RECTAL | Status: DC | PRN

## 2014-04-16 MED ORDER — ONDANSETRON HCL 4 MG/2ML IJ SOLN
INTRAMUSCULAR | Status: DC | PRN
  Administered 2014-04-16: 4 mg via INTRAVENOUS

## 2014-04-16 MED ORDER — MENTHOL 3 MG MT LOZG
1.0000 | LOZENGE | OROMUCOSAL | Status: DC | PRN
  Filled 2014-04-16: qty 9

## 2014-04-16 MED ORDER — HYDROMORPHONE HCL 1 MG/ML IJ SOLN
0.5000 mg | INTRAMUSCULAR | Status: DC | PRN
  Administered 2014-04-17 – 2014-04-18 (×4): 1 mg via INTRAVENOUS
  Filled 2014-04-16 (×4): qty 1

## 2014-04-16 MED ORDER — PHENOL 1.4 % MT LIQD: 1.0000 | OROMUCOSAL | Status: DC | PRN

## 2014-04-16 MED ORDER — MORPHINE SULFATE 2 MG/ML IJ SOLN
1.0000 mg | INTRAMUSCULAR | Status: DC | PRN
  Administered 2014-04-16 (×2): 2 mg via INTRAVENOUS

## 2014-04-16 MED ORDER — CYCLOBENZAPRINE HCL 10 MG PO TABS: 10.0000 mg | ORAL_TABLET | Freq: Three times a day (TID) | ORAL | Status: DC | PRN

## 2014-04-16 MED ORDER — ROCURONIUM BROMIDE 100 MG/10ML IV SOLN
INTRAVENOUS | Status: DC | PRN
  Administered 2014-04-16: 35 mg via INTRAVENOUS

## 2014-04-16 MED ORDER — CEFAZOLIN SODIUM-DEXTROSE 2-3 GM-% IV SOLR
INTRAVENOUS | Status: AC
  Filled 2014-04-16: qty 50

## 2014-04-16 MED ORDER — CEFAZOLIN SODIUM-DEXTROSE 2-3 GM-% IV SOLR
INTRAVENOUS | Status: DC | PRN
  Administered 2014-04-16: 2 g via INTRAVENOUS

## 2014-04-16 MED ORDER — MIDAZOLAM HCL 2 MG/2ML IJ SOLN
INTRAMUSCULAR | Status: AC
  Filled 2014-04-16: qty 2

## 2014-04-16 MED ORDER — MORPHINE SULFATE 2 MG/ML IJ SOLN
INTRAMUSCULAR | Status: AC
  Filled 2014-04-16: qty 1

## 2014-04-16 MED ORDER — ACETAMINOPHEN 325 MG PO TABS: 650.0000 mg | ORAL_TABLET | ORAL | Status: DC | PRN

## 2014-04-16 MED ORDER — ONDANSETRON HCL 4 MG/2ML IJ SOLN
4.0000 mg | INTRAMUSCULAR | Status: DC | PRN
  Administered 2014-04-19 – 2014-04-23 (×6): 4 mg via INTRAVENOUS
  Filled 2014-04-16 (×7): qty 2

## 2014-04-16 MED ORDER — ALUM & MAG HYDROXIDE-SIMETH 200-200-20 MG/5ML PO SUSP: 30.0000 mL | Freq: Four times a day (QID) | ORAL | Status: DC | PRN

## 2014-04-16 MED ORDER — FENTANYL CITRATE 0.05 MG/ML IJ SOLN
INTRAMUSCULAR | Status: DC | PRN
  Administered 2014-04-16 (×2): 50 ug via INTRAVENOUS

## 2014-04-16 MED ORDER — HEMOSTATIC AGENTS (NO CHARGE) OPTIME
TOPICAL | Status: DC | PRN
  Administered 2014-04-16: 1 via TOPICAL

## 2014-04-16 MED ORDER — SODIUM CHLORIDE 0.9 % IV SOLN: 250.0000 mL | INTRAVENOUS | Status: DC

## 2014-04-16 MED ORDER — GLYCOPYRROLATE 0.2 MG/ML IJ SOLN
INTRAMUSCULAR | Status: DC | PRN
  Administered 2014-04-16: 0.6 mg via INTRAVENOUS

## 2014-04-16 MED ORDER — LIDOCAINE HCL (CARDIAC) 20 MG/ML IV SOLN
INTRAVENOUS | Status: DC | PRN
  Administered 2014-04-16: 80 mg via INTRAVENOUS

## 2014-04-16 MED ORDER — FENTANYL CITRATE 0.05 MG/ML IJ SOLN
INTRAMUSCULAR | Status: AC
  Filled 2014-04-16: qty 5

## 2014-04-16 MED ORDER — SODIUM CHLORIDE 0.9 % IR SOLN
Status: DC | PRN
  Administered 2014-04-16: 500 mL

## 2014-04-16 MED ORDER — PROPOFOL 10 MG/ML IV BOLUS
INTRAVENOUS | Status: AC
  Filled 2014-04-16: qty 20

## 2014-04-16 MED ORDER — THROMBIN 5000 UNITS EX SOLR
OROMUCOSAL | Status: DC | PRN
  Administered 2014-04-16: 19:00:00 via TOPICAL

## 2014-04-16 SURGICAL SUPPLY — 62 items
BAG DECANTER FOR FLEXI CONT (MISCELLANEOUS) ×3 IMPLANT
BENZOIN TINCTURE PRP APPL 2/3 (GAUZE/BANDAGES/DRESSINGS) ×3 IMPLANT
BLADE CLIPPER SURG (BLADE) IMPLANT
BLADE SURG 11 STRL SS (BLADE) ×3 IMPLANT
BRUSH SCRUB EZ PLAIN DRY (MISCELLANEOUS) ×3 IMPLANT
BUR MATCHSTICK NEURO 3.0 LAGG (BURR) ×3 IMPLANT
BUR PRECISION FLUTE 6.0 (BURR) ×3 IMPLANT
CANISTER SUCT 3000ML (MISCELLANEOUS) ×3 IMPLANT
CLOSURE WOUND 1/2 X4 (GAUZE/BANDAGES/DRESSINGS) ×1
CONT SPEC 4OZ CLIKSEAL STRL BL (MISCELLANEOUS) ×9 IMPLANT
DECANTER SPIKE VIAL GLASS SM (MISCELLANEOUS) ×3 IMPLANT
DERMABOND ADVANCED (GAUZE/BANDAGES/DRESSINGS) ×2
DERMABOND ADVANCED .7 DNX12 (GAUZE/BANDAGES/DRESSINGS) ×1 IMPLANT
DRAPE LAPAROTOMY 100X72X124 (DRAPES) ×3 IMPLANT
DRAPE MICROSCOPE LEICA (MISCELLANEOUS) ×3 IMPLANT
DRAPE POUCH INSTRU U-SHP 10X18 (DRAPES) ×3 IMPLANT
DRAPE PROXIMA HALF (DRAPES) IMPLANT
DRAPE SURG 17X23 STRL (DRAPES) ×3 IMPLANT
DRSG OPSITE 4X5.5 SM (GAUZE/BANDAGES/DRESSINGS) ×3 IMPLANT
DRSG OPSITE POSTOP 3X4 (GAUZE/BANDAGES/DRESSINGS) ×3 IMPLANT
DRSG TELFA 3X8 NADH (GAUZE/BANDAGES/DRESSINGS) ×9 IMPLANT
DURAPREP 26ML APPLICATOR (WOUND CARE) ×3 IMPLANT
ELECT REM PT RETURN 9FT ADLT (ELECTROSURGICAL) ×3
ELECTRODE REM PT RTRN 9FT ADLT (ELECTROSURGICAL) ×1 IMPLANT
GAUZE SPONGE 4X4 12PLY STRL (GAUZE/BANDAGES/DRESSINGS) ×3 IMPLANT
GAUZE SPONGE 4X4 16PLY XRAY LF (GAUZE/BANDAGES/DRESSINGS) IMPLANT
GLOVE BIO SURGEON STRL SZ 6.5 (GLOVE) ×4 IMPLANT
GLOVE BIO SURGEON STRL SZ8 (GLOVE) ×3 IMPLANT
GLOVE BIO SURGEONS STRL SZ 6.5 (GLOVE) ×2
GLOVE BIOGEL PI IND STRL 7.0 (GLOVE) ×1 IMPLANT
GLOVE BIOGEL PI INDICATOR 7.0 (GLOVE) ×2
GLOVE EXAM NITRILE LRG STRL (GLOVE) ×3 IMPLANT
GLOVE EXAM NITRILE MD LF STRL (GLOVE) IMPLANT
GLOVE EXAM NITRILE XL STR (GLOVE) IMPLANT
GLOVE EXAM NITRILE XS STR PU (GLOVE) IMPLANT
GLOVE INDICATOR 8.5 STRL (GLOVE) ×3 IMPLANT
GOWN STRL REUS W/ TWL LRG LVL3 (GOWN DISPOSABLE) ×1 IMPLANT
GOWN STRL REUS W/ TWL XL LVL3 (GOWN DISPOSABLE) ×2 IMPLANT
GOWN STRL REUS W/TWL 2XL LVL3 (GOWN DISPOSABLE) IMPLANT
GOWN STRL REUS W/TWL LRG LVL3 (GOWN DISPOSABLE) ×2
GOWN STRL REUS W/TWL XL LVL3 (GOWN DISPOSABLE) ×4
HEMOSTAT POWDER SURGIFOAM 1G (HEMOSTASIS) ×3 IMPLANT
KIT BASIN OR (CUSTOM PROCEDURE TRAY) ×3 IMPLANT
KIT ROOM TURNOVER OR (KITS) ×3 IMPLANT
LIQUID BAND (GAUZE/BANDAGES/DRESSINGS) ×3 IMPLANT
NEEDLE HYPO 22GX1.5 SAFETY (NEEDLE) ×3 IMPLANT
NEEDLE SPNL 22GX3.5 QUINCKE BK (NEEDLE) ×3 IMPLANT
NS IRRIG 1000ML POUR BTL (IV SOLUTION) ×3 IMPLANT
PACK LAMINECTOMY NEURO (CUSTOM PROCEDURE TRAY) ×3 IMPLANT
RUBBERBAND STERILE (MISCELLANEOUS) ×6 IMPLANT
SPONGE SURGIFOAM ABS GEL SZ50 (HEMOSTASIS) ×3 IMPLANT
STRIP CLOSURE SKIN 1/2X4 (GAUZE/BANDAGES/DRESSINGS) ×2 IMPLANT
SUT VIC AB 0 CT1 18XCR BRD8 (SUTURE) ×1 IMPLANT
SUT VIC AB 0 CT1 8-18 (SUTURE) ×2
SUT VIC AB 2-0 CT1 18 (SUTURE) ×3 IMPLANT
SUT VICRYL 4-0 PS2 18IN ABS (SUTURE) ×3 IMPLANT
SWAB COLLECTION DEVICE MRSA (MISCELLANEOUS) ×9 IMPLANT
SYR 20ML ECCENTRIC (SYRINGE) ×3 IMPLANT
TOWEL OR 17X24 6PK STRL BLUE (TOWEL DISPOSABLE) ×3 IMPLANT
TOWEL OR 17X26 10 PK STRL BLUE (TOWEL DISPOSABLE) ×3 IMPLANT
TUBE ANAEROBIC SPECIMEN COL (MISCELLANEOUS) ×6 IMPLANT
WATER STERILE IRR 1000ML POUR (IV SOLUTION) ×3 IMPLANT

## 2014-04-16 NOTE — Anesthesia Postprocedure Evaluation (Signed)
  Anesthesia Post-op Note  Patient: Samuel Schultz  Procedure(s) Performed: Procedure(s): Left Lumbar five-Sacral one laminectomy decompression and discectomy  (Left)  Patient Location: PACU  Anesthesia Type: General   Level of Consciousness: awake, alert  and oriented  Airway and Oxygen Therapy: Patient Spontanous Breathing  Post-op Pain: mild  Post-op Assessment: Post-op Vital signs reviewed  Post-op Vital Signs: Reviewed  Last Vitals:  Filed Vitals:   04/16/14 1955  BP: 108/51  Pulse: 91  Temp:   Resp: 14    Complications: No apparent anesthesia complications

## 2014-04-16 NOTE — Progress Notes (Signed)
NUTRITION FOLLOW UP  Pt meets criteria for severe MALNUTRITION in the context of chronic illness as evidenced by PO intake <75% for > one month, 18% body weight loss in 6 month, moderate muscle wasting and subcutaneous fat loss .  Intervention:   -Continue with Ensure Complete po BID, each supplement provides 350 kcal and 13 grams of protein  Nutrition Dx:   Unintentional wt loss related to chronic disease as evidenced by >30 lbs weight loss in 6 months; progressing  Goal:   Pt to meet >/= 90% of their estimated nutrition needs  Monitor:   Total protein/energy intake, labs, weights  Assessment:   38 year old male who has a past medical history of HIV disease; Cirrhosis; PTSD (post-traumatic stress disorder); Depressive disorder; Cancer; Lymphoma; and Anemia. patient was admitted to [facility] in August with enterococcal bacteremia but then found to have VRE and Candida started on fluconazole and vancomycin for 14 days. At that time he had MRI done for the back pain which was negative but then which progress to discitis and vertebral osteomyelitis. At that time patient was not found to be a good operative candidate and was discharged to a nursing center with 8 weeks of Cubicin and cefepime, with fluconazole. Patient has been getting IV cefepime at this time, and currently residing at the [facility].  Today patient came to the hospital with chief complaint of left-sided flank pain  Pt has been NPO due to upcoming surgery (open biopsy/excision discectomy and cultures for recurrent intractable discitis). He is being followed by ID and neurosurgery. Surgery has been posiponed due to INR levels- plan is for surgery this evening.  Intake has been prior to surgery; PO: 25-100%. He has orders for Ensure Complete supplements, although noted some refusals- pt accepts approximately 50% of the time. Will continue with supplements due to malnutrition dx, as well as increased needs for HIV. Also  noted pt developed stage II pressure ulcer on sacrum.  Discharge disposition is for SNF [facility]) once bed available. CSW following.  Labs reviewed. Na: 134, Calcium: 8.3, BUN/Creat now WDL. K: 3.4   Height: Ht Readings from Last 1 Encounters:  03/24/14 5\' 8"  (1.727 m)    Weight Status:   Wt Readings from Last 1 Encounters:  03/30/14 171 lb 11.8 oz (77.9 kg)    Re-estimated needs:  Kcal: 2100-2300  Protein: 101-11 grams  Fluid: 2.1-2.3 L  Skin: stage II pressure ulcer on sacrum  Diet Order: Diet general Diet NPO time specified   Intake/Output Summary (Last 24 hours) at 04/16/14 1451 Last data filed at 04/16/14 1444  Gross per 24 hour  Intake 1507.67 ml  Output    850 ml  Net 657.67 ml    Last BM: 04/15/14   Labs:   Recent Labs Lab 04/13/14 0550 04/15/14 0508 04/16/14 0522  NA 134* 135* 135*  K 3.7 3.6* 3.4*  CL 102 104 103  CO2 24 24 24   BUN 7 7 7   CREATININE 0.45* 0.71 0.61  CALCIUM 8.1* 8.4 8.3*  GLUCOSE 100* 79 90    CBG (last 3)  No results for input(s): GLUCAP in the last 72 hours.  Scheduled Meds: . sodium chloride   Intravenous Once  . atovaquone  750 mg Oral Daily  . buPROPion  300 mg Oral Daily  . clonazePAM  1 mg Oral QHS  . Darunavir Ethanolate  800 mg Oral Q breakfast  . emtricitabine-tenofovir  1 tablet Oral QHS  . feeding supplement (ENSURE  COMPLETE)  237 mL Oral BID BM  . feeding supplement (ENSURE)  1 Container Oral TID BM  . gabapentin  400 mg Oral TID  . morphine  60 mg Oral Q12H  . polyethylene glycol  17 g Oral Daily  . ritonavir  100 mg Oral Q breakfast  . senna-docusate  2 tablet Oral Daily  . sucralfate  1 g Oral TID WC    Continuous Infusions: . sodium chloride 10 mL/hr at 04/15/14 0548    Kateline Kinkade A. Jimmye Norman, RD, LDN Pager: [phone] After hours Pager: [phone]

## 2014-04-16 NOTE — Anesthesia Preprocedure Evaluation (Addendum)
Anesthesia Evaluation  Patient identified by MRN, date of birth, ID band Patient awake    Reviewed: Allergy & Precautions, H&P , NPO status , Patient's Chart, lab work & pertinent test results, Unable to perform ROS - Chart review only  History of Anesthesia Complications Negative for: history of anesthetic complications  Airway Mallampati: II   Neck ROM: Full    Dental  (+) Dental Advisory Given, Teeth Intact   Pulmonary neg pulmonary ROS,  breath sounds clear to auscultation        Cardiovascular + dysrhythmias Supra Ventricular Tachycardia Rhythm:Regular Rate:Normal     Neuro/Psych Depression negative neurological ROS     GI/Hepatic negative GI ROS, (+) Cirrhosis -      , Lymphoma splenomegaly   Endo/Other  negative endocrine ROS  Renal/GU      Musculoskeletal  (+) Arthritis -,   Abdominal   Peds  Hematology  (+) anemia , HIV, HIV/AIDS.pancytopenic   Anesthesia Other Findings   Reproductive/Obstetrics                          Anesthesia Physical Anesthesia Plan  ASA: IV  Anesthesia Plan: General   Post-op Pain Management:    Induction: Intravenous  Airway Management Planned: Oral ETT  Additional Equipment:   Intra-op Plan:   Post-operative Plan: Extubation in OR  Informed Consent: I have reviewed the patients History and Physical, chart, labs and discussed the procedure including the risks, benefits and alternatives for the proposed anesthesia with the patient or authorized representative who has indicated his/her understanding and acceptance.     Plan Discussed with: CRNA and Surgeon  Anesthesia Plan Comments:         Anesthesia Quick Evaluation

## 2014-04-16 NOTE — Progress Notes (Signed)
[facility]    Antibiotics stopped    Subjective: Nervous about surgery   Antibiotics:  Anti-infectives    Start     Dose/Rate Route Frequency Ordered Stop   03/29/14 0000  DAPTOmycin 630 mg in sodium chloride 0.9 % 100 mL     630 mg225.2 mL/hr over 30 Minutes Intravenous Every 24 hours 03/29/14 1056     03/29/14 0000  micafungin 150 mg in sodium chloride 0.9 % 100 mL     150 mg100 mL/hr over 1 Hours Intravenous Daily 03/29/14 1056     03/25/14 0800  DAPTOmycin (CUBICIN) 630 mg in sodium chloride 0.9 % IVPB  Status:  Discontinued    Comments:  ~8 mg/kg   630 mg225.2 mL/hr over 30 Minutes Intravenous Every 24 hours 03/24/14 1337 04/10/14 1243   03/24/14 1400  micafungin (MYCAMINE) 150 mg in sodium chloride 0.9 % 100 mL IVPB  Status:  Discontinued     150 mg100 mL/hr over 1 Hours Intravenous Daily 03/24/14 1326 04/10/14 1243   03/24/14 1000  azithromycin (ZITHROMAX) tablet 1,200 mg  Status:  Discontinued     1,200 mg Oral Every Sat 03/24/14 0113 04/10/14 1243   03/24/14 1000  ceFEPIme (MAXIPIME) 2 g in dextrose 5 % 50 mL IVPB  Status:  Discontinued     2 g100 mL/hr over 30 Minutes Intravenous Every 12 hours 03/24/14 0113 03/24/14 1326   03/24/14 1000  fluconazole (DIFLUCAN) tablet 400 mg  Status:  Discontinued     400 mg Oral Daily 03/24/14 0113 03/24/14 1326   03/24/14 0800  Darunavir Ethanolate (PREZISTA) tablet 800 mg     800 mg Oral Daily with breakfast 03/24/14 0113     03/24/14 0800  ritonavir (NORVIR) capsule 100 mg     100 mg Oral Daily with breakfast 03/24/14 0113     03/24/14 0145  DAPTOmycin (CUBICIN) 630 mg in sodium chloride 0.9 % IVPB    Comments:  ~8 mg/kg   630 mg225.2 mL/hr over 30 Minutes Intravenous  Once 03/24/14 0140 03/24/14 0344   03/24/14 0112  atovaquone (MEPRON) 750 MG/5ML suspension 750 mg     750 mg Oral Daily 03/24/14 0113     03/24/14  0112  emtricitabine-tenofovir (TRUVADA) 200-300 MG per tablet 1 tablet     1 tablet Oral Daily at bedtime 03/24/14 0113        Medications: Scheduled Meds: . sodium chloride   Intravenous Once  . atovaquone  750 mg Oral Daily  . buPROPion  300 mg Oral Daily  . clonazePAM  1 mg Oral QHS  . Darunavir Ethanolate  800 mg Oral Q breakfast  . emtricitabine-tenofovir  1 tablet Oral QHS  . feeding supplement (ENSURE COMPLETE)  237 mL Oral BID BM  . feeding supplement (ENSURE)  1 Container Oral TID BM  . gabapentin  400 mg Oral TID  . morphine  60 mg Oral Q12H  . polyethylene glycol  17 g Oral Daily  . ritonavir  100 mg Oral Q breakfast  . senna-docusate  2 tablet Oral Daily  . sucralfate  1 g Oral TID WC   Continuous Infusions: . sodium chloride 10 mL/hr at 04/15/14 0548   PRN Meds:.ALPRAZolam, antiseptic oral rinse, bisacodyl, HYDROmorphone (DILAUDID) injection, methocarbamol, morphine, ondansetron (ZOFRAN) IV, sodium chloride, sodium phosphate, temazepam    Objective: Weight change:   Intake/Output Summary (Last 24 hours) at 04/16/14 1312 Last data filed at 04/16/14 1111  Gross per 24 hour  Intake 1125.67 ml  Output    850 ml  Net 275.67 ml   Blood pressure 109/59, pulse 85, temperature 98.5 F (36.9 C), temperature source Oral, resp. rate 12, height 5\' 8"  (1.727 m), weight 171 lb 11.8 oz (77.9 kg), SpO2 95 %. Temp:  [97.5 F (36.4 C)-98.9 F (37.2 C)] 98.5 F (36.9 C) (11/09 1244) Pulse Rate:  [85-94] 85 (11/09 1244) Resp:  [12-20] 12 (11/09 1244) BP: (104-117)/(51-65) 109/59 mmHg (11/09 1244) SpO2:  [95 %-98 %] 95 % (11/09 0529)  Physical Exam: General: Alert and awake, oriented x3, not in any acute distress. Sitting in chair with brace Neuro: nonfocal  CBC:  CBC Latest Ref Rng 04/16/2014 04/15/2014 04/13/2014  WBC 4.0 - 10.5 K/uL 2.0(L) 2.8(L) 2.6(L)  Hemoglobin 13.0 - 17.0 g/dL 7.3(L) 7.9(L) 7.9(L)  Hematocrit 39.0 - 52.0 % 21.7(L) 23.8(L) 24.0(L)  Platelets  150 - 400 K/uL 97(L) 93(L) 100(L)      BMET  Recent Labs  04/15/14 0508 04/16/14 0522  NA 135* 135*  K 3.6* 3.4*  CL 104 103  CO2 24 24  GLUCOSE 79 90  BUN 7 7  CREATININE 0.71 0.61  CALCIUM 8.4 8.3*     Liver Panel  No results for input(s): PROT, ALBUMIN, AST, ALT, ALKPHOS, BILITOT, BILIDIR, IBILI in the last 72 hours.     Sedimentation Rate No results for input(s): ESRSEDRATE in the last 72 hours. C-Reactive Protein No results for input(s): CRP in the last 72 hours.  Micro Results: Recent Results (from the past 720 hour(s))  Surgical pcr screen     Status: None   Collection Time: 03/24/14  1:51 AM  Result Value Ref Range Status   MRSA, PCR NEGATIVE NEGATIVE Final   Staphylococcus aureus NEGATIVE NEGATIVE Final    Comment:        The Xpert SA Assay (FDA approved for NASAL specimens in patients over 20 years of age), is one component of a comprehensive surveillance program.  Test performance has been validated by [employer] for patients greater than or equal to 56 year old. It is not intended to diagnose infection nor to guide or monitor treatment.  Surgical pcr screen     Status: None   Collection Time: 04/12/14  1:29 AM  Result Value Ref Range Status   MRSA, PCR NEGATIVE NEGATIVE Final   Staphylococcus aureus NEGATIVE NEGATIVE Final    Comment:        The Xpert SA Assay (FDA approved for NASAL specimens in patients over 65 years of age), is one component of a comprehensive surveillance program.  Test performance has been validated by [employer] for patients greater than or equal to 82 year old. It is not intended to diagnose infection nor to guide or monitor treatment.   Surgical PCR screen     Status: None   Collection Time: 04/16/14  7:45 AM  Result Value Ref Range Status   MRSA, PCR NEGATIVE NEGATIVE Final   Staphylococcus aureus NEGATIVE NEGATIVE Final    Comment:        The Xpert SA Assay (FDA approved for NASAL specimens in  patients over 49 years of age), is one component of a comprehensive surveillance program.  Test performance has been validated by [employer] for patients greater than or equal to 31 year old. It is not intended to diagnose infection nor to guide or monitor treatment.     Studies/Results: No results found.    Assessment/Plan:  Principal Problem:  Diskitis Active Problems:   AIDS   Cirrhosis, non-alcoholic   Pancytopenia   Protein-calorie malnutrition, severe   Abscess in epidural space of lumbar spine   Infectious discitis   Discitis of lumbar region   HIV (human immunodeficiency virus infection)   Osteomyelitis of lumbar spine   Fungal osteomyelitis   VRE (vancomycin resistant enterococcus) culture positive   Pain, hip    Samuel Schultz is a 38 y.o. 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 Dr. Linus Salmons broadened anti-fungals to mycafungin which he has been on with daptomycin  #1 Thoracic and Lumbar Diskitis: persistent infection in L spine and new infection in T spine DESPITE MULTIPLE ROUNDS OF IV ANTIBIOTICS INCLUDING ANTIFUNGALS  GREATLY APPRECIATE DR. CRAM'S HELP HERE and he is going to take pt to OR today if INR is ok for surgery  --HOPEFULLY THIS WILL HELP CONTROL HIS INFECTION AND  WITH CULTURES SENT FOR   #1 BACTERIAL CULTURS #2 FUNGAL CULTURES #3 AFB CULTURES  GIVE [place] A TARGET FOR ANTIMICROBIAL THERAPY  I have DCD  HIS DAPTOMICIN AND MICAFUNGIN ALONG WITH AZITHRO TO INCREASE YIELD ON  CULTURES   #2 Hip pain: likely referred pain due to his infetion but would consider MRI hip   #3 HIV: doing perfectly with suppression and undetectable viral load      LOS: 24 days   Alcide Evener 04/16/2014, 1:12 PM

## 2014-04-16 NOTE — Progress Notes (Signed)
Patient continues to have back pain and left leg pain rating down S1 disposition. Patient is been refractory to all forms of antibiotic management and CT-guided aspiration of the spaces to identify adequate bacteria to treat for his discitis. Dr. Drucilla Schmidt has asked me to do an open discectomy and obtain tissue. I have extensively talked with patient about this and gone over the risks and benefits of the left side L5-S1 laminectomy microdiscectomy. We have gone over the perioperative course expectations of outcome alternatives of surgery to include the increased risk of bleeding and cystic, patient's was comorbid medical conditions to include pancytopenia cirrhosis and HIV. Patient stands all this and agrees to proceed forward. We have transfused him approximate 4 units of FFP and INR is currently 1.5 we will be transfusing him another unit of FFP and a 5 pack of platelets while we are operating. I think this minimizes the potential risk as best we can. We will not give any preoperative antibiotics until we obtained cultures.

## 2014-04-16 NOTE — Progress Notes (Signed)
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 He was admitted to [facility], from [facility] rehabilitation facility, patient was recently in the hospital for treatment of L3-L4 discitis and abscess at the L5-S1 level. He was discharged on cefepime, daptomycin and fluconazole. In the nursing facility patient complained about worsening back pain, brought back to the ED and repeat MRI of the back showed worsening of L3-4 discitis and new discitis at the level of T10-11. He was admitted to the hospital for further evaluation, started on daptomycin and micafungin per ID. On 11/3 ID stopped his antbiotic and antfungal therapy and requested that Neurosurgery re-evaluate the patient and attempt to obtain deep cultures in order to target treatment.  Left-sided L5-S1 laminectomy and microdiscectomy originally planned for 04/12/2014, but delayed due to elevated INR.  Will plan for vitamin K and FFP.  Neurosurgery rescheduled for Monday afternoon if INR is 1.2 or less.  Subjective No complaints.  2 BM yesterday.  Back pain worse yesterday - not as bed today.  Has multiple questions about surgery this evening.  Ex. "where will they take the bx from? How big will the cuts be?"  Assessment/Plan: Acute discitis:  Recent h/o L3-4 discitis and L5-S1 abscess. Came in with new discitis at T10-11, worsening L5-S1 abscess, and persistent L3-4 discitis. Patient discharged on cefepime, daptomycin and fluconazole. Seen by ID here and placed on Micafungin and daptomycin 8 mg/kg. These were subsequently discontinued on 11/3 in preparation for deep biopsy.    Left-sided L5-S1 laminectomy and microdiscectomy scheduled for 04/16/2014 if INR is 1.2 or less.  Neurosurgery planned for 11/9 if INR is 1.2 or less.  Coagulopathy:  Secondary to  Cirrhosis from R-CHOP therapy  Will give Vit K 10 mg po daily, 2 units of FFP on Sunday evening prior to Surgery on Monday.  Daily INR. Goal is 1.2 or less on Monday morning.  INR 1.6 this morning, minimally improving with vitamin K I suspect due to low reserves of the hepatic function  Nausea with intermittent vomiting:   Resolved.  Suspect releated to narcotics and constipation. Now having BM's on senna.  Tolerating soft diet well.   Back Pain:   Secondary to diskitis with a component of anxiety.   On Klonopin QHS.Pain is overall better 6/10. Back brace that was added for support is helping him become more mobile.   11/3 patient reports left hip pain is improved with KPAD. (thanks physical therapy!)  Pain/Anxiety management regimen: Continue MS Contin 60 mg, Dilaudid 1 mg IV prn,  MSIR 15 mg prn. Has Xanax 0.25 mg bid prn in addition to klonopin qhs.  Will need to wean from IV dilaudid when appropriate.  AIDS/HIV infection:   Absolute CD4 count is 40 on 01/27/14.   Viral load is undetectable.  Patient is on Truvada, Prezista and Norvir. Azithromycin for prophylaxis.   Infectious Disease is on board appreciate their assistance and recommendations.  Cirrhosis:   Reportedly secondary to previous lymphoma infiltration to the liver and R-CHOP chemotherapy.   Radiological evidence of cirrhosis and portal hypertension, with massive splenomegaly and esophageal varices.   With evidence of decompensation given coagulopathy  Pressure ulceration at sacrum  Air Overlay mattress in place.  Foam dressing.  Appreciate wound care consult  Constipation:   Resolved.  On bowel regimen with Senna and Miralax.  Patient's last BM was on 11/8  Pancytopenia:   Likely multifactorial from bone marrow suppression from AIDS/cirrhosis and hypersplenism.  Labs show stable leukopenia, anemia and thrombocytopenia.  Will continue to monitor as needed while patient is in  hospital.  Discontinue Lovenox  in preparation for surgery.  Will need to resume DVT prophylaxis when appropriate.  Severe protein calorie malnutrition: Continue with diet supplements after discharge   History large B-cell lymphoma 2009:   Metastasis to the back. Was treated with chemotherapy in 2009.   Bone marrow biopsy done on 11/02/2013 in the [facility], showed no evidence of recurrent disease.  DVT Prophylaxis:   SCDs  Code Status: Full Family Communication: discussed with patient and POA [place]. Disposition Plan: SNF when appropriate.  After neurosurgery re-evaluation.   Consultants:  ID  Neurosurgery.  Procedures:  None  Antibiotics:  Micafungin 10/17>> 04/10/2014  Daptomycin continued since discharge last time >> 04/10/2014  Objective: Filed Vitals:   04/16/14 0147 04/16/14 0204 04/16/14 0254 04/16/14 0529  BP: 104/51 110/55 106/59 117/63  Pulse: 90 91 90 90  Temp: 98.5 F (36.9 C) 97.5 F (36.4 C) 98.5 F (36.9 C) 98.3 F (36.8 C)  TempSrc: Oral Oral Oral Oral  Resp: 16 16 17 16   Height:      Weight:      SpO2: 96% 96% 95% 95%    Intake/Output Summary (Last 24 hours) at 04/16/14 1033 Last data filed at 04/16/14 0552  Gross per 24 hour  Intake 1125.67 ml  Output    850 ml  Net 275.67 ml   Filed Weights   03/23/14 1241 03/24/14 0057 03/30/14 0743  Weight: 74.844 kg (165 lb) 79.198 kg (174 lb 9.6 oz) 77.9 kg (171 lb 11.8 oz)    Exam: General: in no apparent distress, pleasant.   HEENT: no scleral icterus Neck: Supple, no JVD, no masses  Cardiovascular: RRR, S1 S2 auscultated, no rubs, murmurs or gallops.   Respiratory: Clear to auscultation bilaterally with equal chest rise Abdomen: Soft, nontender, nondistended, + bowel sounds Extremities: no edema   Data Reviewed: Basic Metabolic Panel:  Recent Labs Lab 04/11/14 0522 04/11/14 1220 04/13/14 0550 04/15/14 0508 04/16/14 0522  NA 132* 134* 134* 135* 135*  K 3.7 3.8 3.7 3.6* 3.4*    CL 103 103 102 104 103  CO2 23 22 24 24 24   GLUCOSE 93 71 100* 79 90  BUN 5* 4* 7 7 7   CREATININE 0.43* 0.45* 0.45* 0.71 0.61  CALCIUM 8.2* 8.3* 8.1* 8.4 8.3*   Liver Function Tests:  Recent Labs Lab 04/11/14 1220 04/13/14 0550  AST 29 44*  ALT 16 16  ALKPHOS 96 91  BILITOT 1.7* 1.8*  PROT 7.4 7.1  ALBUMIN 2.0* 1.9*   CBC:  Recent Labs Lab 04/13/14 0550 04/15/14 0508 04/16/14 0522  WBC 2.6* 2.8* 2.0*  HGB 7.9* 7.9* 7.3*  HCT 24.0* 23.8* 21.7*  MCV 95.6 95.2 95.6  PLT 100* 93* 97*   Cardiac Enzymes:  Recent Labs Lab 04/10/14 0521 04/11/14 1550 04/15/14 0508  CKTOTAL 127  --  169  TROPONINI  --  <0.30  --     Studies: No results found.  Scheduled Meds: . sodium chloride   Intravenous Once  . atovaquone  750 mg Oral Daily  . buPROPion  300 mg Oral Daily  . clonazePAM  1 mg Oral QHS  . Darunavir Ethanolate  800 mg Oral Q breakfast  . emtricitabine-tenofovir  1 tablet Oral QHS  . feeding supplement (  ENSURE COMPLETE)  237 mL Oral BID BM  . feeding supplement (ENSURE)  1 Container Oral TID BM  . gabapentin  400 mg Oral TID  . morphine  60 mg Oral Q12H  . polyethylene glycol  17 g Oral Daily  . ritonavir  100 mg Oral Q breakfast  . senna-docusate  2 tablet Oral Daily  . sucralfate  1 g Oral TID WC   Continuous Infusions: . sodium chloride 10 mL/hr at 04/15/14 0548    Principal Problem:   Diskitis Active Problems:   AIDS   Cirrhosis, non-alcoholic   Pancytopenia   Protein-calorie malnutrition, severe   Abscess in epidural space of lumbar spine   Infectious discitis   Discitis of lumbar region   HIV (human immunodeficiency virus infection)   Osteomyelitis of lumbar spine   Fungal osteomyelitis   VRE (vancomycin resistant enterococcus) culture positive   Pain, hip  Time spent: 30 minutes  Ruthine Dose [facility] [phone]  If 7PM-7AM, please contact night-coverage at [email or web address], password [facility] 04/16/2014, 10:33 AM   LOS: 24 days

## 2014-04-16 NOTE — Op Note (Addendum)
Preoperative diagnosis: Discitis L5-S1  Postoperative diagnosis: Same  Procedure: Lumbar laminectomy microdiscectomy L5-S1 left with microdissection of the left S1 nerve root microscopic discectomy for obtaining cultures and tissue for for discitis  Surgeon: Dominica Severin Jase Himmelberger  Anesthesia: Gen.  EBL: Minimal  History of present illness: Patient is a 38 year old gentleman has had multiple episodes of discitis intractable to medical therapy. Multiple cultures of been obtained with CT-guided aspirations without success. Patient currently does complain of back and left leg pain imaging showed fluid in the L5-S1 disc space and epidural enhancement so infectious disease asked [place] to obtain tissue for culture and so I counseled the patient on the risks and benefits of an L5-S1 laminectomy microdiscectomy I extensively reviewed the risks and benefits of the operation the patient as well as perioperative course expectations of outcome and alternatives of surgery and he understood and agreed to proceed forward.  Operative procedure: Patient brought into the or was induced under general anesthesia was positioned prone the Wilson frame his back was prepped and draped in routine sterile fashion. Preoperative x-ray localize the S1-S2 disc space attention taken one level above this and after infiltration of 10 mL of quarter percent Marcaine a midline incision was made and Bovie light cautery was used to thousand subcutaneous tissue and subperiosteal dissections care lamina of L5 and S1 interoperative x-ray identified the appropriate level. So the interest of limb of L5 medial facet complex aggressive S was removed with a 3 mm Kerrison punch ligament was identified and removed piecemeal fashion the S1 pedicle was identified the S1 nerve was identified under my prescription illumination there is a dense amount of adhesion of the S1 nerve root to the epidural substance part of it was discectomy part of it was inflammatory  substance several cultures and specimen were sent at this point from the epidural space. Then I incised the disc space with 11 blade scalpel and disc space and cleaned out extensive amount of fluid macerated disc and sent for cultures as well. There is no further stenosis on thecal sac or S1 nerve root obtained several cultures from both the epidural space and the space was ago was irrigated antibiotics were started after I began the discectomy not before and closed with interrupted Vicryl running 4 subcuticular Dermabond benzo and Steri-Strips. Patient recovered in stable condition at the end of case all needle counts sponge counts were correct.

## 2014-04-16 NOTE — Progress Notes (Signed)
INR 1.6 this am.  Have ordered two units of FFP to attempt to reduce INR to 1.2 before surgery today.  Imogene Burn, PA-C [facility] Pager: [phone]

## 2014-04-16 NOTE — Transfer of Care (Signed)
Immediate Anesthesia Transfer of Care Note  Patient: Samuel Schultz  Procedure(s) Performed: Procedure(s): Left Lumbar five-Sacral one laminectomy decompression and discectomy  (Left)  Patient Location: PACU  Anesthesia Type:General  Level of Consciousness: responds to stimulation  Airway & Oxygen Therapy: Patient Spontanous Breathing and Patient connected to nasal cannula oxygen  Post-op Assessment: Report given to PACU RN, Post -op Vital signs reviewed and stable and Patient moving all extremities  Post vital signs: Reviewed and stable  Complications: No apparent anesthesia complications

## 2014-04-17 ENCOUNTER — Encounter (HOSPITAL_COMMUNITY): Payer: Self-pay | Admitting: Neurosurgery

## 2014-04-17 DIAGNOSIS — R109 Unspecified abdominal pain: Secondary | ICD-10-CM | POA: Insufficient documentation

## 2014-04-17 LAB — BASIC METABOLIC PANEL
ANION GAP: 11 (ref 5–15)
BUN: 5 mg/dL — ABNORMAL LOW (ref 6–23)
CALCIUM: 8.8 mg/dL (ref 8.4–10.5)
CO2: 21 meq/L (ref 19–32)
CREATININE: 0.5 mg/dL (ref 0.50–1.35)
Chloride: 102 mEq/L (ref 96–112)
GFR calc Af Amer: >90 mL/min (ref >90)
Glucose, Bld: 63 mg/dL — ABNORMAL LOW (ref 70–99)
Potassium: 3.9 mEq/L (ref 3.7–5.3)
SODIUM: 134 meq/L — AB (ref 137–147)

## 2014-04-17 LAB — CBC
HEMATOCRIT: 21.7 % — AB (ref 39.0–52.0)
Hemoglobin: 7.2 g/dL — ABNORMAL LOW (ref 13.0–17.0)
MCH: 31.4 pg (ref 26.0–34.0)
MCHC: 33.2 g/dL (ref 30.0–36.0)
MCV: 94.8 fL (ref 78.0–100.0)
PLATELETS: 90 10*3/uL — AB (ref 150–400)
RBC: 2.29 MIL/uL — ABNORMAL LOW (ref 4.22–5.81)
RDW: 16.1 % — ABNORMAL HIGH (ref 11.5–15.5)
WBC: 1.9 10*3/uL — ABNORMAL LOW (ref 4.0–10.5)

## 2014-04-17 LAB — PREPARE FRESH FROZEN PLASMA
Unit division: 0
Unit division: 0
Unit division: 0

## 2014-04-17 LAB — PREPARE PLATELET PHERESIS: UNIT DIVISION: 0

## 2014-04-17 MED ORDER — SODIUM CHLORIDE 0.9 % IV SOLN
150.0000 mg | Freq: Every day | INTRAVENOUS | Status: DC
  Administered 2014-04-17 – 2014-04-23 (×7): 150 mg via INTRAVENOUS
  Filled 2014-04-17 (×7): qty 150

## 2014-04-17 NOTE — Progress Notes (Signed)
CARE MANAGEMENT NOTE 04/17/2014  Patient:  Samuel Schultz, Samuel Schultz   Account Number:  [id number]  Date Initiated:  03/27/2014  Documentation initiated by:  [facility]  Subjective/Objective Assessment:   candidal discitis     Action/Plan:   SNF  11/9 microdesctomy   Anticipated DC Date:  04/18/2014   Anticipated DC Plan:  SKILLED NURSING FACILITY  In-house referral  Clinical Social Worker      DC Planning Services  CM consult      Choice offered to / List presented to:             Status of service:  Completed, signed off Medicare Important Message given?  NO (If response is "NO", the following Medicare IM given date fields will be blank) Date Medicare IM given:   Medicare IM given by:   Date Additional Medicare IM given:   Additional Medicare IM given by:    Discharge Disposition:  [place]  Per UR Regulation:  Reviewed for med. necessity/level of care/duration of stay  If discussed at [place] of Stay Meetings, dates discussed:   04/10/2014  03/08/2014    Comments:  04/17/14 [place], BSN [phone] post of day 1, patient up ambulating, plan is for SNF at [facility], patient has a bed per CSW.  04/10/14 [place], BSN [phone] still awaiting bed at [facility].  04/06/14 [place] BSN [phone] NCM spoke with Pam with [facility] to see how much it would be for patient on cubucin she states it will bd $3 per week.  NCM called Rosaria Ferries the POA to see if patient would be able to stay with her to finish getting the iv abx, she states she can not let him stay with her because her daughter lives with her and her kids, she rather for patient to go to snf. NCM asked if there was someone else he could stay with, she states he has no one else and his family will have nothing to do with him.  04/02/14 [place] RN, BSN [phone] awaiting snf bed, CSW following, awaiting bed at [facility]  03/30/14 [place], BSN [phone] patient is for snf placement at [facility] when a bed becomes available.  Patient is applying for longterm medicaid for snf.  CSW following.  03/28/14 [place], BSN [phone] NCM spoke with patient, informed him that I was told he was interested in ltac, informed patient that medicaid does not cover ltac so this would not be an option for him, he understood, so the plan remains snf.  Plan dc to SNF when medically stable. CSW following for placement. Jonnie Finner RN CCM Case Mgmt phone [phone]

## 2014-04-17 NOTE — Progress Notes (Addendum)
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 He was admitted to [facility], from [facility] rehabilitation facility, patient was recently in the hospital for treatment of L3-L4 discitis and abscess at the L5-S1 level. He was discharged on cefepime, daptomycin and fluconazole. In the nursing facility patient complained about worsening back pain, brought back to the ED and repeat MRI of the back showed worsening of L3-4 discitis and new discitis at the level of T10-11. He was admitted to the hospital for further evaluation, started on daptomycin and micafungin per ID. On 11/3 ID stopped his antbiotic and antfungal therapy and requested that Neurosurgery re-evaluate the patient and attempt to obtain deep cultures in order to target treatment.  Left-sided L5-S1 laminectomy and microdiscectomy originally planned for 04/12/2014, but delayed due to elevated INR. Patient eventually had surgery on 11/9 and his antibiotics were restarted on 11/10.   Subjective - no complaints this morning, he is feeling much better, he is actually ambulating in the hallway with the help of the physical therapist.  Assessment/Plan: Acute discitis:  Recent h/o L3-4 discitis and L5-S1 abscess. Came in with new discitis at T10-11, worsening L5-S1 abscess, and persistent L3-4 discitis. Patient discharged on cefepime, daptomycin and fluconazole. Seen by ID here and placed on Micafungin and daptomycin 8 mg/kg. These were subsequently discontinued on 11/3 in preparation for deep biopsy.    Status post Left-sided L5-S1 laminectomy and microdiscectomy scheduled on 11/9, appreciate neurosurgery input. Following the surgery, infectious disease restarted micafungin. Follow on cultures.  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  Suspect releated to narcotics and constipation. Now having BM's on senna.  Tolerating soft diet well.   Back Pain:   Secondary to diskitis with a component of anxiety.   On Klonopin QHS.Pain is overall better 6/10. Back brace that was added for support is helping him become more mobile.   11/3 patient reports left hip pain is improved with KPAD. (thanks physical therapy!)  Pain/Anxiety management regimen: Continue MS Contin 60 mg, Dilaudid 1 mg IV prn,  MSIR 15 mg prn. Has Xanax 0.25 mg bid prn in addition to klonopin qhs.  Will need to wean from IV dilaudid when appropriate.  AIDS/HIV infection:   Absolute CD4 count is 40 on 01/27/14.   Viral load is undetectable.  Patient is on Truvada, Prezista and Norvir. Azithromycin for prophylaxis.   Infectious Disease is on board appreciate their assistance and recommendations.  Cirrhosis:   Reportedly secondary to previous lymphoma infiltration to the liver and R-CHOP chemotherapy.   Radiological evidence of cirrhosis and portal hypertension, with massive splenomegaly and esophageal varices.   With evidence of decompensation given coagulopathy  Pressure ulceration at sacrum  Air Overlay mattress in place.  Foam dressing.  Appreciate wound care consult  Constipation:   Resolved.  On bowel regimen with Senna and Miralax.  Patient's last BM was on 11/8  Pancytopenia:   Likely multifactorial from bone marrow suppression from AIDS/cirrhosis and hypersplenism.  Labs show stable leukopenia, anemia and thrombocytopenia.  Will continue to monitor as needed while patient is in hospital.  Discontinue Lovenox  in preparation for surgery.  Will need to resume DVT prophylaxis when appropriate.  Severe protein calorie malnutrition: Continue with diet supplements after discharge   History large B-cell lymphoma  2009:   Metastasis to the back. Was treated with  chemotherapy in 2009.   Bone marrow biopsy done on 11/02/2013 in the [facility], showed no evidence of recurrent disease.  DVT Prophylaxis:   SCDs  Code Status: Full Family Communication: discussed with patient and POA Samuel Schultz. Disposition Plan: SNF when appropriate.   Consultants:  ID  Neurosurgery.  Procedures:  None  Antibiotics:  Micafungin 10/17>> 04/10/2014  Daptomycin continued since discharge last time >> 04/10/2014  Micafungin 04/17/2014 >>  Objective: Filed Vitals:   04/16/14 2032 04/17/14 0135 04/17/14 0636 04/17/14 1331  BP: 107/52 116/60 104/55 112/66  Pulse: 91 87 89 91  Temp: 98.9 F (37.2 C) 98.5 F (36.9 C) 98.9 F (37.2 C) 98.4 F (36.9 C)  TempSrc: Oral Oral Oral Oral  Resp: 20 20 20 16   Height:      Weight:   68.13 kg (150 lb 3.2 oz)   SpO2: 94% 96% 96% 99%    Intake/Output Summary (Last 24 hours) at 04/17/14 1600 Last data filed at 04/17/14 0643  Gross per 24 hour  Intake   1297 ml  Output   1490 ml  Net   -193 ml   Filed Weights   03/24/14 0057 03/30/14 0743 04/17/14 0636  Weight: 79.198 kg (174 lb 9.6 oz) 77.9 kg (171 lb 11.8 oz) 68.13 kg (150 lb 3.2 oz)    Exam: General: in no apparent distress, pleasant.   HEENT: no scleral icterus Neck: Supple, no JVD, no masses  Cardiovascular: RRR, S1 S2 auscultated, no rubs, murmurs or gallops.   Respiratory: Clear to auscultation bilaterally with equal chest rise Abdomen: Soft, nontender, nondistended, + bowel sounds Extremities: no edema   Data Reviewed: Basic Metabolic Panel:  Recent Labs Lab 04/11/14 1220 04/13/14 0550 04/15/14 0508 04/16/14 0522 04/17/14 0459  NA 134* 134* 135* 135* 134*  K 3.8 3.7 3.6* 3.4* 3.9  CL 103 102 104 103 102  CO2 22 24 24 24 21   GLUCOSE 71 100* 79 90 63*  BUN 4* 7 7 7  5*  CREATININE 0.45* 0.45* 0.71 0.61 0.50  CALCIUM 8.3* 8.1* 8.4 8.3* 8.8   Liver Function Tests:  Recent Labs Lab 04/11/14 1220 04/13/14 0550  AST 29 44*  ALT 16 16    ALKPHOS 96 91  BILITOT 1.7* 1.8*  PROT 7.4 7.1  ALBUMIN 2.0* 1.9*   CBC:  Recent Labs Lab 04/13/14 0550 04/15/14 0508 04/16/14 0522 04/17/14 0459  WBC 2.6* 2.8* 2.0* 1.9*  HGB 7.9* 7.9* 7.3* 7.2*  HCT 24.0* 23.8* 21.7* 21.7*  MCV 95.6 95.2 95.6 94.8  PLT 100* 93* 97* 90*   Cardiac Enzymes:  Recent Labs Lab 04/11/14 1550 04/15/14 0508  CKTOTAL  --  169  TROPONINI <0.30  --     Studies: Dg Lumbar Spine 2-3 Views  04/16/2014   CLINICAL DATA:  L5-S1 laminectomy.  EXAM: LUMBAR SPINE - 2-3 VIEW  COMPARISON:  03/23/2014.  FINDINGS: Series of 2 intraoperative lateral radiographs for localization purposes demonstrate exposure and a probe over the dorsal aspect of the L5-S1 interspinous space.  IMPRESSION: Intraoperative localization at L5-S1.   Electronically Signed   By: Dereck Ligas M.D.   On: 04/16/2014 21:11    Scheduled Meds: . sodium chloride   Intravenous Once  . sodium chloride   Intravenous Once  . atovaquone  750 mg Oral Daily  . buPROPion  300 mg Oral Daily  . clonazePAM  1 mg Oral QHS  . Darunavir Ethanolate  800  mg Oral Q breakfast  . docusate sodium  100 mg Oral BID  . emtricitabine-tenofovir  1 tablet Oral QHS  . feeding supplement (ENSURE COMPLETE)  237 mL Oral BID BM  . feeding supplement (ENSURE)  1 Container Oral TID BM  . gabapentin  400 mg Oral TID  . micafungin [facility]) IV  150 mg Intravenous Daily  . morphine  60 mg Oral Q12H  . polyethylene glycol  17 g Oral Daily  . ritonavir  100 mg Oral Q breakfast  . senna-docusate  2 tablet Oral Daily  . sodium chloride  3 mL Intravenous Q12H  . sucralfate  1 g Oral TID WC   Continuous Infusions: . sodium chloride 10 mL/hr at 04/16/14 2235  . sodium chloride      Principal Problem:   Diskitis Active Problems:   AIDS   Cirrhosis, non-alcoholic   Pancytopenia   Protein-calorie malnutrition, severe   Discitis   Abscess in epidural space of lumbar spine   Infectious discitis   Discitis of  lumbar region   HIV (human immunodeficiency virus infection)   Osteomyelitis of lumbar spine   Fungal osteomyelitis   VRE (vancomycin resistant enterococcus) culture positive   Pain, hip  Time spent: 25 minutes  Ruthine Dose [facility] [phone]  If 7PM-7AM, please contact night-coverage at [email or web address], password [facility] 04/17/2014, 4:00 PM  LOS: 25 days

## 2014-04-17 NOTE — Plan of Care (Signed)
Problem: Discharge Progression Outcomes Goal: Pain controlled with appropriate interventions Outcome: Progressing     

## 2014-04-17 NOTE — Progress Notes (Signed)
[place] for Infectious [facility]   MYCAFUNGIN DAY #1 RESTART   Subjective: Feel "ok"   Antibiotics:  Anti-infectives    Start     Dose/Rate Route Frequency Ordered Stop   04/17/14 1000  micafungin (MYCAMINE) 150 mg in sodium chloride 0.9 % 100 mL IVPB     150 mg100 mL/hr over 1 Hours Intravenous Daily 04/17/14 0910     04/16/14 2359  ceFAZolin (ANCEF) IVPB 2 g/50 mL premix     2 g100 mL/hr over 30 Minutes Intravenous Every 8 hours 04/16/14 2055 04/17/14 1037   04/16/14 1852  bacitracin 50,000 Units in sodium chloride irrigation 0.9 % 500 mL irrigation  Status:  Discontinued       As needed 04/16/14 1853 04/16/14 1905   03/29/14 0000  DAPTOmycin 630 mg in sodium chloride 0.9 % 100 mL     630 mg225.2 mL/hr over 30 Minutes Intravenous Every 24 hours 03/29/14 1056     03/29/14 0000  micafungin 150 mg in sodium chloride 0.9 % 100 mL     150 mg100 mL/hr over 1 Hours Intravenous Daily 03/29/14 1056     03/25/14 0800  DAPTOmycin (CUBICIN) 630 mg in sodium chloride 0.9 % IVPB  Status:  Discontinued    Comments:  ~8 mg/kg   630 mg225.2 mL/hr over 30 Minutes Intravenous Every 24 hours 03/24/14 1337 04/10/14 1243   03/24/14 1400  micafungin (MYCAMINE) 150 mg in sodium chloride 0.9 % 100 mL IVPB  Status:  Discontinued     150 mg100 mL/hr over 1 Hours Intravenous Daily 03/24/14 1326 04/10/14 1243   03/24/14 1000  azithromycin (ZITHROMAX) tablet 1,200 mg  Status:  Discontinued     1,200 mg Oral Every Sat 03/24/14 0113 04/10/14 1243   03/24/14 1000  ceFEPIme (MAXIPIME) 2 g in dextrose 5 % 50 mL IVPB  Status:  Discontinued     2 g100 mL/hr over 30 Minutes Intravenous Every 12 hours 03/24/14 0113 03/24/14 1326   03/24/14 1000  fluconazole (DIFLUCAN) tablet 400 mg  Status:  Discontinued     400 mg Oral Daily 03/24/14 0113 03/24/14 1326   03/24/14 0800  Darunavir Ethanolate (PREZISTA) tablet 800 mg     800 mg  Oral Daily with breakfast 03/24/14 0113     03/24/14 0800  ritonavir (NORVIR) capsule 100 mg     100 mg Oral Daily with breakfast 03/24/14 0113     03/24/14 0145  DAPTOmycin (CUBICIN) 630 mg in sodium chloride 0.9 % IVPB    Comments:  ~8 mg/kg   630 mg225.2 mL/hr over 30 Minutes Intravenous  Once 03/24/14 0140 03/24/14 0344   03/24/14 0112  atovaquone (MEPRON) 750 MG/5ML suspension 750 mg     750 mg Oral Daily 03/24/14 0113     03/24/14 0112  emtricitabine-tenofovir (TRUVADA) 200-300 MG per tablet 1 tablet     1 tablet Oral Daily at bedtime 03/24/14 0113        Medications: Scheduled Meds: . sodium chloride   Intravenous Once  . sodium chloride   Intravenous Once  . atovaquone  750 mg Oral Daily  . buPROPion  300 mg Oral Daily  . clonazePAM  1 mg Oral QHS  . Darunavir Ethanolate  800 mg Oral Q breakfast  . docusate sodium  100 mg Oral BID  . emtricitabine-tenofovir  1 tablet Oral QHS  . feeding supplement (ENSURE COMPLETE)  237 mL Oral BID BM  . feeding supplement (ENSURE)  1 Container Oral TID  BM  . gabapentin  400 mg Oral TID  . micafungin [facility]) IV  150 mg Intravenous Daily  . morphine  60 mg Oral Q12H  . polyethylene glycol  17 g Oral Daily  . ritonavir  100 mg Oral Q breakfast  . senna-docusate  2 tablet Oral Daily  . sodium chloride  3 mL Intravenous Q12H  . sucralfate  1 g Oral TID WC   Continuous Infusions: . sodium chloride 10 mL/hr at 04/16/14 2235  . sodium chloride     PRN Meds:.acetaminophen **OR** acetaminophen, ALPRAZolam, alum & mag hydroxide-simeth, antiseptic oral rinse, bisacodyl, cyclobenzaprine, HYDROmorphone (DILAUDID) injection, HYDROmorphone (DILAUDID) injection, menthol-cetylpyridinium **OR** phenol, methocarbamol, morphine, ondansetron (ZOFRAN) IV, ondansetron (ZOFRAN) IV, sodium chloride, sodium chloride, sodium phosphate, temazepam    Objective: Weight change:   Intake/Output Summary (Last 24 hours) at 04/17/14 1259 Last data filed at  04/17/14 0643  Gross per 24 hour  Intake   1577 ml  Output   1490 ml  Net     87 ml   Blood pressure 104/55, pulse 89, temperature 98.9 F (37.2 C), temperature source Oral, resp. rate 20, height 5\' 8"  (1.727 m), weight 150 lb 3.2 oz (68.13 kg), SpO2 96 %. Temp:  [98.5 F (36.9 C)-98.9 F (37.2 C)] 98.9 F (37.2 C) (11/10 0636) Pulse Rate:  [87-107] 89 (11/10 0636) Resp:  [7-21] 20 (11/10 0636) BP: (100-121)/(48-67) 104/55 mmHg (11/10 0636) SpO2:  [94 %-100 %] 96 % (11/10 0636) Weight:  [150 lb 3.2 oz (68.13 kg)] 150 lb 3.2 oz (68.13 kg) (11/10 0636)  Physical Exam: General: Alert and awake, oriented x3, not in any acute distress. Sitting in chair with brace Neuro: nonfocal  CBC:  CBC Latest Ref Rng 04/17/2014 04/16/2014 04/15/2014  WBC 4.0 - 10.5 K/uL 1.9(L) 2.0(L) 2.8(L)  Hemoglobin 13.0 - 17.0 g/dL 7.2(L) 7.3(L) 7.9(L)  Hematocrit 39.0 - 52.0 % 21.7(L) 21.7(L) 23.8(L)  Platelets 150 - 400 K/uL 90(L) 97(L) 93(L)      BMET  Recent Labs  04/16/14 0522 04/17/14 0459  NA 135* 134*  K 3.4* 3.9  CL 103 102  CO2 24 21  GLUCOSE 90 63*  BUN 7 5*  CREATININE 0.61 0.50  CALCIUM 8.3* 8.8     Liver Panel  No results for input(s): PROT, ALBUMIN, AST, ALT, ALKPHOS, BILITOT, BILIDIR, IBILI in the last 72 hours.     Sedimentation Rate No results for input(s): ESRSEDRATE in the last 72 hours. C-Reactive Protein No results for input(s): CRP in the last 72 hours.  Micro Results: Recent Results (from the past 720 hour(s))  Surgical pcr screen     Status: None   Collection Time: 03/24/14  1:51 AM  Result Value Ref Range Status   MRSA, PCR NEGATIVE NEGATIVE Final   Staphylococcus aureus NEGATIVE NEGATIVE Final    Comment:        The Xpert SA Assay (FDA approved for NASAL specimens in patients over 34 years of age), is one component of a comprehensive surveillance program.  Test performance has been validated by [employer] for patients greater than or equal to  51 year old. It is not intended to diagnose infection nor to guide or monitor treatment.  Surgical pcr screen     Status: None   Collection Time: 04/12/14  1:29 AM  Result Value Ref Range Status   MRSA, PCR NEGATIVE NEGATIVE Final   Staphylococcus aureus NEGATIVE NEGATIVE Final    Comment:        The  Xpert SA Assay (FDA approved for NASAL specimens in patients over 62 years of age), is one component of a comprehensive surveillance program.  Test performance has been validated by [employer] for patients greater than or equal to 72 year old. It is not intended to diagnose infection nor to guide or monitor treatment.   Surgical PCR screen     Status: None   Collection Time: 04/16/14  7:45 AM  Result Value Ref Range Status   MRSA, PCR NEGATIVE NEGATIVE Final   Staphylococcus aureus NEGATIVE NEGATIVE Final    Comment:        The Xpert SA Assay (FDA approved for NASAL specimens in patients over 86 years of age), is one component of a comprehensive surveillance program.  Test performance has been validated by [employer] for patients greater than or equal to 92 year old. It is not intended to diagnose infection nor to guide or monitor treatment.   Wound culture     Status: None (Preliminary result)   Collection Time: 04/16/14  6:42 PM  Result Value Ref Range Status   Specimen Description WOUND BACK  Final   Special Requests NOC L5 S1 DISC SPACE  Final   Gram Stain PENDING  Incomplete   Culture NO GROWTH Performed at [facility]   Final   Report Status PENDING  Incomplete  Wound culture     Status: None (Preliminary result)   Collection Time: 04/16/14  6:42 PM  Result Value Ref Range Status   Specimen Description WOUND BACK  Final   Special Requests NO A L5 S1 EPIDURAL SPACE  Final   Gram Stain PENDING  Incomplete   Culture NO GROWTH Performed at [facility]   Final   Report Status PENDING  Incomplete  Wound culture     Status: None (Preliminary  result)   Collection Time: 04/16/14  6:42 PM  Result Value Ref Range Status   Specimen Description WOUND BACK  Final   Special Requests NOB L5 S1 EPIDURAL SPACE  Final   Gram Stain PENDING  Incomplete   Culture NO GROWTH Performed at [facility]   Final   Report Status PENDING  Incomplete  Fungus Culture with Smear     Status: None (Preliminary result)   Collection Time: 04/16/14  6:44 PM  Result Value Ref Range Status   Specimen Description WOUND BACK  Final   Special Requests NO A L5 S1 EPIDURAL SPACE  Final   Fungal Smear   Final    NO YEAST OR FUNGAL ELEMENTS SEEN Performed at [facility]    Culture   Final    CULTURE IN PROGRESS FOR FOUR WEEKS Performed at [facility]    Report Status PENDING  Incomplete  Fungus Culture with Smear     Status: None (Preliminary result)   Collection Time: 04/16/14  6:44 PM  Result Value Ref Range Status   Specimen Description WOUND BACK  Final   Special Requests NOB SWABS L5 S1 EPIDURAL SPACE  Final   Fungal Smear   Final    NO YEAST OR FUNGAL ELEMENTS SEEN Performed at [facility]    Culture   Final    CULTURE IN PROGRESS FOR FOUR WEEKS Performed at [facility]    Report Status PENDING  Incomplete  Fungus Culture with Smear     Status: None (Preliminary result)   Collection Time: 04/16/14  6:44 PM  Result Value Ref Range Status  Specimen Description WOUND BACK  Final   Special Requests NOC L5 S1 DISC SPACE  Final   Fungal Smear   Final    NO YEAST OR FUNGAL ELEMENTS SEEN Performed at [facility]    Culture   Final    CULTURE IN PROGRESS FOR FOUR WEEKS Performed at [facility]    Report Status PENDING  Incomplete  Fungus Culture with Smear     Status: None (Preliminary result)   Collection Time: 04/16/14  6:44 PM  Result Value Ref Range Status   Specimen Description WOUND BACK  Final   Special Requests NOD L5 S1 DISC SPACE  Final   Fungal Smear   Final    NO  YEAST OR FUNGAL ELEMENTS SEEN Performed at [facility]    Culture   Final    CULTURE IN PROGRESS FOR FOUR WEEKS Performed at [facility]    Report Status PENDING  Incomplete    Studies/Results: Dg Lumbar Spine 2-3 Views  04/16/2014   CLINICAL DATA:  L5-S1 laminectomy.  EXAM: LUMBAR SPINE - 2-3 VIEW  COMPARISON:  03/23/2014.  FINDINGS: Series of 2 intraoperative lateral radiographs for localization purposes demonstrate exposure and a probe over the dorsal aspect of the L5-S1 interspinous space.  IMPRESSION: Intraoperative localization at L5-S1.   Electronically Signed   By: Dereck Ligas M.D.   On: 04/16/2014 21:11      Assessment/Plan:  Principal Problem:   Diskitis Active Problems:   AIDS   Cirrhosis, non-alcoholic   Pancytopenia   Protein-calorie malnutrition, severe   Discitis   Abscess in epidural space of lumbar spine   Infectious discitis   Discitis of lumbar region   HIV (human immunodeficiency virus infection)   Osteomyelitis of lumbar spine   Fungal osteomyelitis   VRE (vancomycin resistant enterococcus) culture positive   Pain, hip    Samuel Schultz is a 38 y.o. 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 We took him off antibiotics last week and he is now sp L5-S1 microsiscectomy with nerve root discetomy and biopsies mx for culture by Dr. Saintclair Halsted yesteday  #1 Thoracic and Lumbar Diskitis: persistent infection in L spine and new infection in T spine DESPITE MULTIPLE ROUNDS OF IV ANTIBIOTICS  INCLUDING ANTIFUNGALS  GREATLY APPRECIATE DR. CRAM'S HELP HERE  And pt is sp Neurosurgery  I AM RESTARTING MICAFUNGIN, AND WE WILL FOLLOWUP CULTURES -HOPEFULLY THIS WILL HELP CONTROL HIS INFECTION AND   #2 Hip pain: likely referred pain due to his infetion but would consider MRI hip   #3 HIV: doing perfectly with suppression and undetectable viral load      LOS: 25 days   Samuel Schultz 04/17/2014, 12:59 PM

## 2014-04-17 NOTE — Progress Notes (Signed)
Patient ID: Samuel Schultz, male   DOB: May 27, 1976, 38 y.o.   MRN: [id number] Patient doing well pain well managed no leg pain  Strength out of 5 wound clean dry and intact  Sent cultures bacterial fungal and AFB all pending continue to mobilize with physical therapy as needed

## 2014-04-17 NOTE — Progress Notes (Signed)
Physical Therapy Treatment Patient Details Name: Samuel Schultz MRN: [id number] DOB: 09-15-1975 Today's Date: 04/17/2014    History of Present Illness Pt is a 38 y.o. male with history of HIV/AIDS, lymphoma, nonalcoholic cirrhosis, coagulopathy, pancytopenia presented to the ER because of worsening back pain. Patient states that he has been having pain in his low back last few days it has been radiating to his right leg. Denies any fever chills. Patient was recently admitted for discitis and is on IV daptomycin and ceftriaxone. MRI L-spine shows epidural abscess L5-S1 area. On-call neurosurgeon Dr. Saintclair Halsted was consulted and Dr. Saintclair Halsted at this time feels patient does not have any cauda equina features.    PT Comments    Doing very well post lami and disk at L5/S1.  Reinforced all back care/precautions since newly post sx.  Follow Up Recommendations  SNF;Supervision/Assistance - 24 hour     Equipment Recommendations  None recommended by PT    Recommendations for Other Services       Precautions / Restrictions Precautions Precautions: Fall Precaution Comments: reinforced back precautions for safety and decreased pain Required Braces or Orthoses: Spinal Brace Spinal Brace: Thoracolumbosacral orthotic;Applied in sitting position Restrictions Weight Bearing Restrictions: No    Mobility  Bed Mobility Overal bed mobility: Needs Assistance Bed Mobility: Rolling;Sidelying to Sit Rolling: Supervision (with rail) Sidelying to sit: Min assist       General bed mobility comments: reinforced log roll and safest technique side to sit to sidelying  Transfers Overall transfer level: Needs assistance Equipment used: Rolling walker (2 wheeled) Transfers: Sit to/from Stand Sit to Stand: Min guard         General transfer comment: struggle but no assist; cues for safest hand placement  Ambulation/Gait Ambulation/Gait assistance: Supervision Ambulation Distance (Feet): 250 Feet Assistive  device: Rolling walker (2 wheeled) Gait Pattern/deviations: Step-through pattern Gait velocity: slow   General Gait Details: guarded with mildly flexed posture.  Standing rest breaks used to extend his distance   Financial trader Rankin (Stroke Patients Only)       Balance Overall balance assessment: Needs assistance Sitting-balance support: No upper extremity supported;Single extremity supported Sitting balance-Leahy Scale: Fair Sitting balance - Comments: prefers to use UE's for decreased pain, but able sit EOB without UE assist   Standing balance support: No upper extremity supported Standing balance-Leahy Scale: Fair                      Cognition Arousal/Alertness: Awake/alert Behavior During Therapy: WFL for tasks assessed/performed Overall Cognitive Status: Within Functional Limits for tasks assessed         Following Commands: Follows one step commands consistently            Exercises      General Comments        Pertinent Vitals/Pain Pain Assessment: Faces Faces Pain Scale: Hurts little more Pain Location: incision pain Pain Descriptors / Indicators: Nagging Pain Intervention(s): Limited activity within patient's tolerance    Home Living                      Prior Function            PT Goals (current goals can now be found in the care plan section) Acute Rehab PT Goals Patient Stated Goal: Want to get better and out of here. PT Goal Formulation: With patient Time For  Goal Achievement: 05/01/14 Potential to Achieve Goals: Good Progress towards PT goals: Progressing toward goals    Frequency  Min 5X/week    PT Plan Current plan remains appropriate    Co-evaluation             End of Session   Activity Tolerance: Patient tolerated treatment well Patient left: in chair;with call bell/phone within reach;with family/visitor present     Time: 1572-6203 PT Time  Calculation (min) (ACUTE ONLY): 39 min  Charges:  $Gait Training: 8-22 mins $Therapeutic Activity: 8-22 mins                    G Codes:      Camdynn Maranto, Tessie Fass 04/17/2014, 11:34 AM 04/17/2014  Donnella Sham, PT [phone]  (pager)

## 2014-04-18 DIAGNOSIS — M4644 Discitis, unspecified, thoracic region: Secondary | ICD-10-CM | POA: Insufficient documentation

## 2014-04-18 DIAGNOSIS — M25559 Pain in unspecified hip: Secondary | ICD-10-CM | POA: Insufficient documentation

## 2014-04-18 LAB — BASIC METABOLIC PANEL
Anion gap: 11 (ref 5–15)
BUN: 7 mg/dL (ref 6–23)
CHLORIDE: 101 meq/L (ref 96–112)
CO2: 21 meq/L (ref 19–32)
CREATININE: 0.58 mg/dL (ref 0.50–1.35)
Calcium: 8.6 mg/dL (ref 8.4–10.5)
GFR calc Af Amer: >90 mL/min (ref >90)
GFR calc non Af Amer: >90 mL/min (ref >90)
Glucose, Bld: 75 mg/dL (ref 70–99)
Potassium: 3.5 mEq/L — ABNORMAL LOW (ref 3.7–5.3)
Sodium: 133 mEq/L — ABNORMAL LOW (ref 137–147)

## 2014-04-18 LAB — CBC
HCT: 24.3 % — ABNORMAL LOW (ref 39.0–52.0)
Hemoglobin: 8.1 g/dL — ABNORMAL LOW (ref 13.0–17.0)
MCH: 32 pg (ref 26.0–34.0)
MCHC: 33.3 g/dL (ref 30.0–36.0)
MCV: 96 fL (ref 78.0–100.0)
Platelets: 97 10*3/uL — ABNORMAL LOW (ref 150–400)
RBC: 2.53 MIL/uL — AB (ref 4.22–5.81)
RDW: 16.3 % — ABNORMAL HIGH (ref 11.5–15.5)
WBC: 2.1 10*3/uL — AB (ref 4.0–10.5)

## 2014-04-18 LAB — PROTIME-INR
INR: 1.55 — ABNORMAL HIGH (ref 0.00–1.49)
Prothrombin Time: 18.7 seconds — ABNORMAL HIGH (ref 11.6–15.2)

## 2014-04-18 MED ORDER — HYDROMORPHONE HCL 1 MG/ML IJ SOLN: 0.5000 mg | INTRAMUSCULAR | Status: DC | PRN

## 2014-04-18 MED ORDER — METOCLOPRAMIDE HCL 5 MG/ML IJ SOLN
10.0000 mg | Freq: Three times a day (TID) | INTRAMUSCULAR | Status: DC | PRN
  Filled 2014-04-18: qty 2

## 2014-04-18 NOTE — Progress Notes (Signed)
OT Cancellation Note  Patient Details Name: Samuel Schultz MRN: [id number] DOB: 1976-04-11   Cancelled Treatment:    Reason Eval/Treat Not Completed: Pain limiting ability to participate  Benito Mccreedy OTR/L [phone] 04/18/2014, 12:19 PM

## 2014-04-18 NOTE — Progress Notes (Signed)
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 He was admitted to [facility], from [facility] rehabilitation facility.  He was recently in the hospital for treatment of L3-L4 discitis and abscess at the L5-S1 level. He was discharged on cefepime, daptomycin and fluconazole. In the nursing facility patient complained about worsening back pain, brought back to the ED and repeat MRI of the back showed worsening of L3-4 discitis and new discitis at the level of T10-11. He was admitted to the hospital for further evaluation, started on daptomycin and micafungin per ID. On 11/3 ID stopped his antbiotic and antfungal therapy and requested that Neurosurgery re-evaluate the patient and attempt to obtain deep cultures in order to target treatment.  Left-sided L5-S1 laminectomy and microdiscectomy originally planned for 04/12/2014, but delayed due to elevated INR. Patient eventually had surgery on 11/9 and his antibiotics were restarted on 11/10.   Subjective Patient complaining of pain in his left hip.  Thinks he may have overworked it yesterday.  Reports current pain regimen is adequate.  Assessment/Plan: Acute discitis:  Recent h/o L3-4 discitis and L5-S1 abscess. Came in with new discitis at T10-11, worsening L5-S1 abscess, and persistent L3-4 discitis.  Seen by ID here and placed on Micafungin and daptomycin 8 mg/kg. These were subsequently discontinued on 11/3 in preparation for deep biopsy.    Status post Left-sided L5-S1 laminectomy and microdiscectomy  on 11/9.   Following the surgery, infectious disease restarted micafungin. Currently operative wound cultures, fungal cultures, and AFB show no growth to date.  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  Suspect releated to narcotics and constipation. Now having BM's on senna.  Tolerating soft diet well.   Back Pain:   Secondary to diskitis with a component of anxiety.   On Klonopin QHS. Pain is overall better 6/10. Back brace that was added for support is helping him become more mobile.   Pain/Anxiety management regimen: Continue MS Contin 60 mg, Dilaudid .5 mg IV prn,  MSIR 15 mg prn. Has Xanax 0.25 mg bid prn in addition to klonopin qhs.  Reduced dilaudid today.  Plan to eliminate dilaudid tomorrow.  AIDS/HIV infection:   Absolute CD4 count is 40 on 01/27/14.   Viral load is undetectable.  Patient is on Truvada, Prezista and Norvir. Azithromycin for prophylaxis.   Infectious Disease is on board appreciate their assistance and recommendations.  Cirrhosis:   Reportedly secondary to previous lymphoma infiltration to the liver and R-CHOP chemotherapy.   Radiological evidence of cirrhosis and portal hypertension, with massive splenomegaly and esophageal varices.   With evidence of decompensation given coagulopathy  Pressure ulceration at sacrum  Air Overlay mattress in place.  Foam dressing.  Improved.  Appreciate wound care consult  Constipation:   Resolved.  On bowel regimen with Senna and Miralax.  Patient's last BM was on 11/8  Pancytopenia:   Likely multifactorial from bone marrow suppression from AIDS/cirrhosis and hypersplenism.  Labs show stable leukopenia, anemia and thrombocytopenia.  Will continue to monitor as needed while patient is in hospital.  Discontinue Lovenox  in preparation for surgery.  Will need to resume DVT prophylaxis when appropriate.  Severe protein calorie malnutrition: Continue with diet supplements after discharge   History large B-cell lymphoma 2009:   Metastasis to the back. Was  treated with chemotherapy in 2009.   Bone marrow biopsy done [facility] 11/02/[facility], showed no evidence of recurrent  disease.  DVT Prophylaxis:   SCDs  Code Status: Full Family Communication: discussed with patient  Disposition Plan: SNF when appropriate.   Consultants:  ID  Neurosurgery.  Procedures:  None  Antibiotics:  Micafungin 10/17>> 04/10/2014  Daptomycin continued since discharge last time >> 04/10/2014  Micafungin 04/17/2014 >>  Objective: Filed Vitals:   04/17/14 2222 04/18/14 0257 04/18/14 0500 04/18/14 0607  BP: 118/62 107/60  118/64  Pulse: 94 90  91  Temp: 99.7 F (37.6 C) 99.4 F (37.4 C)  98.8 F (37.1 C)  TempSrc: Oral Oral  Oral  Resp: 14 16  17   Height:      Weight:   68.04 kg (150 lb)   SpO2: 97% 96%  97%    Intake/Output Summary (Last 24 hours) at 04/18/14 1434 Last data filed at 04/18/14 1251  Gross per 24 hour  Intake    320 ml  Output    625 ml  Net   -305 ml   Filed Weights   03/30/14 0743 04/17/14 0636 04/18/14 0500  Weight: 77.9 kg (171 lb 11.8 oz) 68.13 kg (150 lb 3.2 oz) 68.04 kg (150 lb)    Exam: General:   no apparent distress, wd, male, pale complexion HEENT: no scleral icterus Neck: Supple, no JVD, no masses  Cardiovascular: RRR, S1 S2 auscultated, no rubs, murmurs or gallops.   Respiratory: Clear to auscultation bilaterally with equal chest rise Abdomen: Soft, nontender, nondistended, + bowel sounds Extremities: no edema   Data Reviewed: Basic Metabolic Panel:  Recent Labs Lab 04/13/14 0550 04/15/14 0508 04/16/14 0522 04/17/14 0459 04/18/14 0530  NA 134* 135* 135* 134* 133*  K 3.7 3.6* 3.4* 3.9 3.5*  CL 102 104 103 102 101  CO2 24 24 24 21 21   GLUCOSE 100* 79 90 63* 75  BUN 7 7 7  5* 7  CREATININE 0.45* 0.71 0.61 0.50 0.58  CALCIUM 8.1* 8.4 8.3* 8.8 8.6   Liver Function Tests:  Recent Labs Lab 04/13/14 0550  AST 44*  ALT 16  ALKPHOS 91  BILITOT 1.8*  PROT 7.1  ALBUMIN 1.9*   CBC:  Recent Labs Lab 04/13/14 0550 04/15/14 0508 04/16/14 0522 04/17/14 0459 04/18/14 0530  WBC 2.6* 2.8* 2.0* 1.9* 2.1*    HGB 7.9* 7.9* 7.3* 7.2* 8.1*  HCT 24.0* 23.8* 21.7* 21.7* 24.3*  MCV 95.6 95.2 95.6 94.8 96.0  PLT 100* 93* 97* 90* 97*   Cardiac Enzymes:  Recent Labs Lab 04/11/14 1550 04/15/14 0508  CKTOTAL  --  169  TROPONINI <0.30  --     Studies: Dg Lumbar Spine 2-3 Views  04/16/2014   CLINICAL DATA:  L5-S1 laminectomy.  EXAM: LUMBAR SPINE - 2-3 VIEW  COMPARISON:  03/23/2014.  FINDINGS: Series of 2 intraoperative lateral radiographs for localization purposes demonstrate exposure and a probe over the dorsal aspect of the L5-S1 interspinous space.  IMPRESSION: Intraoperative localization at L5-S1.   Electronically Signed   By: Dereck Ligas M.D.   On: 04/16/2014 21:11    Scheduled Meds: . sodium chloride   Intravenous Once  . sodium chloride   Intravenous Once  . atovaquone  750 mg Oral Daily  . buPROPion  300 mg Oral Daily  . clonazePAM  1 mg Oral QHS  . Darunavir Ethanolate  800 mg Oral Q breakfast  . docusate sodium  100 mg Oral BID  .  emtricitabine-tenofovir  1 tablet Oral QHS  . feeding supplement (ENSURE COMPLETE)  237 mL Oral BID BM  . feeding supplement (ENSURE)  1 Container Oral TID BM  . gabapentin  400 mg Oral TID  . micafungin [facility]) IV  150 mg Intravenous Daily  . morphine  60 mg Oral Q12H  . polyethylene glycol  17 g Oral Daily  . ritonavir  100 mg Oral Q breakfast  . senna-docusate  2 tablet Oral Daily  . sodium chloride  3 mL Intravenous Q12H  . sucralfate  1 g Oral TID WC   Continuous Infusions: . sodium chloride 10 mL/hr at 04/16/14 2235  . sodium chloride      Principal Problem:   Diskitis Active Problems:   AIDS   Cirrhosis, non-alcoholic   Pancytopenia   Protein-calorie malnutrition, severe   Discitis   Abscess in epidural space of lumbar spine   Infectious discitis   Discitis of lumbar region   HIV (human immunodeficiency virus infection)   Osteomyelitis of lumbar spine   Fungal osteomyelitis   VRE (vancomycin resistant enterococcus) culture  positive   Pain, hip   Left flank pain  Time spent: 25 minutes  Ruthine Dose [facility] [phone]  If 7PM-7AM, please contact night-coverage at [email or web address], password [facility] 04/18/2014, 2:34 PM  LOS: 26 days

## 2014-04-18 NOTE — Consult Note (Addendum)
WOC follow-up: Requested to re-assess buttocks. Pt with scabbed area to buttocks; refer to previous progress notes on 11/6.  Area has greatly improved; dry scabbed skin has peeled over 95% of buttocks, 5% remains.  Pink dry skin revealed underneath.  No open wound or drainage; no need for topical treatment at this time. Pt denies c/o pain.   Please re-consult if further assistance is needed.  Thank-you,  Julien Girt MSN, [place], [place], [place], [place]

## 2014-04-18 NOTE — Progress Notes (Signed)
[facility]   MYCAFUNGIN DAY #2 RESTART   Subjective: Co left hip pain   Antibiotics:  Anti-infectives    Start     Dose/Rate Route Frequency Ordered Stop   04/17/14 1000  micafungin (MYCAMINE) 150 mg in sodium chloride 0.9 % 100 mL IVPB     150 mg100 mL/hr over 1 Hours Intravenous Daily 04/17/14 0910     04/16/14 2359  ceFAZolin (ANCEF) IVPB 2 g/50 mL premix     2 g100 mL/hr over 30 Minutes Intravenous Every 8 hours 04/16/14 2055 04/17/14 1037   04/16/14 1852  bacitracin 50,000 Units in sodium chloride irrigation 0.9 % 500 mL irrigation  Status:  Discontinued       As needed 04/16/14 1853 04/16/14 1905   03/29/14 0000  DAPTOmycin 630 mg in sodium chloride 0.9 % 100 mL     630 mg225.2 mL/hr over 30 Minutes Intravenous Every 24 hours 03/29/14 1056     03/29/14 0000  micafungin 150 mg in sodium chloride 0.9 % 100 mL     150 mg100 mL/hr over 1 Hours Intravenous Daily 03/29/14 1056     03/25/14 0800  DAPTOmycin (CUBICIN) 630 mg in sodium chloride 0.9 % IVPB  Status:  Discontinued    Comments:  ~8 mg/kg   630 mg225.2 mL/hr over 30 Minutes Intravenous Every 24 hours 03/24/14 1337 04/10/14 1243   03/24/14 1400  micafungin (MYCAMINE) 150 mg in sodium chloride 0.9 % 100 mL IVPB  Status:  Discontinued     150 mg100 mL/hr over 1 Hours Intravenous Daily 03/24/14 1326 04/10/14 1243   03/24/14 1000  azithromycin (ZITHROMAX) tablet 1,200 mg  Status:  Discontinued     1,200 mg Oral Every Sat 03/24/14 0113 04/10/14 1243   03/24/14 1000  ceFEPIme (MAXIPIME) 2 g in dextrose 5 % 50 mL IVPB  Status:  Discontinued     2 g100 mL/hr over 30 Minutes Intravenous Every 12 hours 03/24/14 0113 03/24/14 1326   03/24/14 1000  fluconazole (DIFLUCAN) tablet 400 mg  Status:  Discontinued     400 mg Oral Daily 03/24/14 0113 03/24/14 1326   03/24/14 0800  Darunavir Ethanolate (PREZISTA) tablet 800 mg     800 mg Oral Daily with breakfast 03/24/14 0113     03/24/14 0800  ritonavir (NORVIR) capsule 100 mg     100 mg Oral Daily with breakfast 03/24/14 0113     03/24/14 0145  DAPTOmycin (CUBICIN) 630 mg in sodium chloride 0.9 % IVPB    Comments:  ~8 mg/kg   630 mg225.2 mL/hr over 30 Minutes Intravenous  Once 03/24/14 0140 03/24/14 0344   03/24/14 0112  atovaquone (MEPRON) 750 MG/5ML suspension 750 mg     750 mg Oral Daily 03/24/14 0113     03/24/14 0112  emtricitabine-tenofovir (TRUVADA) 200-300 MG per tablet 1 tablet     1 tablet Oral Daily at bedtime 03/24/14 0113        Medications: Scheduled Meds: . sodium chloride   Intravenous Once  . sodium chloride   Intravenous Once  . atovaquone  750 mg Oral Daily  . buPROPion  300 mg Oral Daily  . clonazePAM  1 mg Oral QHS  . Darunavir Ethanolate  800 mg Oral Q breakfast  . docusate sodium  100 mg Oral BID  . emtricitabine-tenofovir  1 tablet Oral QHS  . feeding supplement (ENSURE COMPLETE)  237 mL Oral BID BM  . feeding supplement (ENSURE)  1 Container Oral  TID BM  . gabapentin  400 mg Oral TID  . micafungin [facility]) IV  150 mg Intravenous Daily  . morphine  60 mg Oral Q12H  . polyethylene glycol  17 g Oral Daily  . ritonavir  100 mg Oral Q breakfast  . senna-docusate  2 tablet Oral Daily  . sodium chloride  3 mL Intravenous Q12H  . sucralfate  1 g Oral TID WC   Continuous Infusions: . sodium chloride 10 mL/hr at 04/16/14 2235  . sodium chloride     PRN Meds:.acetaminophen **OR** acetaminophen, ALPRAZolam, alum & mag hydroxide-simeth, antiseptic oral rinse, bisacodyl, cyclobenzaprine, HYDROmorphone (DILAUDID) injection, menthol-cetylpyridinium **OR** phenol, methocarbamol, metoCLOPramide (REGLAN) injection, morphine, ondansetron (ZOFRAN) IV, ondansetron (ZOFRAN) IV, sodium chloride, sodium chloride, sodium phosphate, temazepam    Objective: Weight change: -3.2 oz (-0.091 kg)  Intake/Output Summary (Last 24 hours) at 04/18/14  1846 Last data filed at 04/18/14 1251  Gross per 24 hour  Intake    100 ml  Output    275 ml  Net   -175 ml   Blood pressure 102/55, pulse 92, temperature 99.2 F (37.3 C), temperature source Oral, resp. rate 20, height 5\' 8"  (1.727 m), weight 150 lb (68.04 kg), SpO2 97 %. Temp:  [98.8 F (37.1 C)-99.7 F (37.6 C)] 99.2 F (37.3 C) (11/11 1521) Pulse Rate:  [90-94] 92 (11/11 1521) Resp:  [14-20] 20 (11/11 1521) BP: (102-118)/(55-64) 102/55 mmHg (11/11 1521) SpO2:  [96 %-97 %] 97 % (11/11 1521) Weight:  [150 lb (68.04 kg)] 150 lb (68.04 kg) (11/11 0500)  Physical Exam: General: Alert and awake, oriented x3, not in any acute distress. Sitting in chair with brace Neuro: nonfocal  CBC:  CBC Latest Ref Rng 04/18/2014 04/17/2014 04/16/2014  WBC 4.0 - 10.5 K/uL 2.1(L) 1.9(L) 2.0(L)  Hemoglobin 13.0 - 17.0 g/dL 8.1(L) 7.2(L) 7.3(L)  Hematocrit 39.0 - 52.0 % 24.3(L) 21.7(L) 21.7(L)  Platelets 150 - 400 K/uL 97(L) 90(L) 97(L)      BMET  Recent Labs  04/17/14 0459 04/18/14 0530  NA 134* 133*  K 3.9 3.5*  CL 102 101  CO2 21 21  GLUCOSE 63* 75  BUN 5* 7  CREATININE 0.50 0.58  CALCIUM 8.8 8.6     Liver Panel  No results for input(s): PROT, ALBUMIN, AST, ALT, ALKPHOS, BILITOT, BILIDIR, IBILI in the last 72 hours.     Sedimentation Rate No results for input(s): ESRSEDRATE in the last 72 hours. C-Reactive Protein No results for input(s): CRP in the last 72 hours.  Micro Results: Recent Results (from the past 720 hour(s))  Surgical pcr screen     Status: None   Collection Time: 03/24/14  1:51 AM  Result Value Ref Range Status   MRSA, PCR NEGATIVE NEGATIVE Final   Staphylococcus aureus NEGATIVE NEGATIVE Final    Comment:        The Xpert SA Assay (FDA approved for NASAL specimens in patients over 7 years of age), is one component of a comprehensive surveillance program.  Test performance has been validated by [employer] for patients greater than or equal  to 60 year old. It is not intended to diagnose infection nor to guide or monitor treatment.  Surgical pcr screen     Status: None   Collection Time: 04/12/14  1:29 AM  Result Value Ref Range Status   MRSA, PCR NEGATIVE NEGATIVE Final   Staphylococcus aureus NEGATIVE NEGATIVE Final    Comment:        The Xpert SA  Assay (FDA approved for NASAL specimens in patients over 58 years of age), is one component of a comprehensive surveillance program.  Test performance has been validated by [employer] for patients greater than or equal to 36 year old. It is not intended to diagnose infection nor to guide or monitor treatment.   Surgical PCR screen     Status: None   Collection Time: 04/16/14  7:45 AM  Result Value Ref Range Status   MRSA, PCR NEGATIVE NEGATIVE Final   Staphylococcus aureus NEGATIVE NEGATIVE Final    Comment:        The Xpert SA Assay (FDA approved for NASAL specimens in patients over 46 years of age), is one component of a comprehensive surveillance program.  Test performance has been validated by [employer] for patients greater than or equal to 28 year old. It is not intended to diagnose infection nor to guide or monitor treatment.   Wound culture     Status: None (Preliminary result)   Collection Time: 04/16/14  6:42 PM  Result Value Ref Range Status   Specimen Description WOUND BACK  Final   Special Requests NOC L5 S1 DISC SPACE  Final   Gram Stain   Final    NO WBC SEEN NO SQUAMOUS EPITHELIAL CELLS SEEN NO ORGANISMS SEEN Performed at [facility]    Culture   Final    NO GROWTH 1 DAY Performed at [facility]    Report Status PENDING  Incomplete  Wound culture     Status: None (Preliminary result)   Collection Time: 04/16/14  6:42 PM  Result Value Ref Range Status   Specimen Description WOUND BACK  Final   Special Requests NO A L5 S1 EPIDURAL SPACE  Final   Gram Stain   Final    NO WBC SEEN NO SQUAMOUS EPITHELIAL CELLS  SEEN NO ORGANISMS SEEN Performed at [facility]    Culture   Final    NO GROWTH 1 DAY Performed at [facility]    Report Status PENDING  Incomplete  Wound culture     Status: None (Preliminary result)   Collection Time: 04/16/14  6:42 PM  Result Value Ref Range Status   Specimen Description WOUND BACK  Final   Special Requests NOB L5 S1 EPIDURAL SPACE  Final   Gram Stain   Final    NO WBC SEEN NO SQUAMOUS EPITHELIAL CELLS SEEN NO ORGANISMS SEEN Performed at [facility]    Culture   Final    NO GROWTH 1 DAY Performed at [facility]    Report Status PENDING  Incomplete  Anaerobic culture     Status: None (Preliminary result)   Collection Time: 04/16/14  6:42 PM  Result Value Ref Range Status   Specimen Description WOUND BACK  Final   Special Requests NO A L5 S1 EPIDURAL SPACE  Final   Gram Stain   Final    NO WBC SEEN NO SQUAMOUS EPITHELIAL CELLS SEEN NO ORGANISMS SEEN Performed at [facility]    Culture   Final    NO ANAEROBES ISOLATED; CULTURE IN PROGRESS FOR 5 DAYS Performed at [facility]    Report Status PENDING  Incomplete  Anaerobic culture     Status: None (Preliminary result)   Collection Time: 04/16/14  6:42 PM  Result Value Ref Range Status   Specimen Description WOUND BACK  Final   Special Requests NO B L5 S1 EPIDURAL SPACE  Final   Gram Stain PENDING  Incomplete   Culture   Final    NO ANAEROBES ISOLATED; CULTURE IN PROGRESS FOR 5 DAYS Performed at [facility]    Report Status PENDING  Incomplete  Anaerobic culture     Status: None (Preliminary result)   Collection Time: 04/16/14  6:42 PM  Result Value Ref Range Status   Specimen Description WOUND BACK  Final   Special Requests NO C L5 S1 DISC SPACE  Final   Gram Stain   Final    NO WBC SEEN NO SQUAMOUS EPITHELIAL CELLS SEEN NO ORGANISMS SEEN Performed at [facility]    Culture   Final    NO ANAEROBES ISOLATED; CULTURE IN  PROGRESS FOR 5 DAYS Performed at [facility]    Report Status PENDING  Incomplete  Anaerobic culture     Status: None (Preliminary result)   Collection Time: 04/16/14  6:42 PM  Result Value Ref Range Status   Specimen Description WOUND BACK  Final   Special Requests NO D L5 S1 DISC SPACE  Final   Gram Stain   Final    NO WBC SEEN NO SQUAMOUS EPITHELIAL CELLS SEEN NO ORGANISMS SEEN Performed at [facility]    Culture   Final    NO ANAEROBES ISOLATED; CULTURE IN PROGRESS FOR 5 DAYS Performed at [facility]    Report Status PENDING  Incomplete  Fungus Culture with Smear     Status: None (Preliminary result)   Collection Time: 04/16/14  6:44 PM  Result Value Ref Range Status   Specimen Description WOUND BACK  Final   Special Requests NO A L5 S1 EPIDURAL SPACE  Final   Fungal Smear   Final    NO YEAST OR FUNGAL ELEMENTS SEEN Performed at [facility]    Culture   Final    CULTURE IN PROGRESS FOR FOUR WEEKS Performed at [facility]    Report Status PENDING  Incomplete  Fungus Culture with Smear     Status: None (Preliminary result)   Collection Time: 04/16/14  6:44 PM  Result Value Ref Range Status   Specimen Description WOUND BACK  Final   Special Requests NOB SWABS L5 S1 EPIDURAL SPACE  Final   Fungal Smear   Final    NO YEAST OR FUNGAL ELEMENTS SEEN Performed at [facility]    Culture   Final    CULTURE IN PROGRESS FOR FOUR WEEKS Performed at [facility]    Report Status PENDING  Incomplete  Fungus Culture with Smear     Status: None (Preliminary result)   Collection Time: 04/16/14  6:44 PM  Result Value Ref Range Status   Specimen Description WOUND BACK  Final   Special Requests NOC L5 S1 DISC SPACE  Final   Fungal Smear   Final    NO YEAST OR FUNGAL ELEMENTS SEEN Performed at [facility]    Culture   Final    CULTURE IN PROGRESS FOR FOUR WEEKS Performed at [facility]    Report  Status PENDING  Incomplete  Fungus Culture with Smear     Status: None (Preliminary result)   Collection Time: 04/16/14  6:44 PM  Result Value Ref Range Status   Specimen Description WOUND BACK  Final   Special Requests NOD L5 S1 DISC SPACE  Final   Fungal Smear   Final    NO YEAST OR FUNGAL ELEMENTS SEEN Performed  at [facility]   Final    CULTURE IN PROGRESS FOR FOUR WEEKS Performed at [facility]    Report Status PENDING  Incomplete  AFB culture with smear     Status: None (Preliminary result)   Collection Time: 04/16/14  6:44 PM  Result Value Ref Range Status   Specimen Description WOUND BACK  Final   Special Requests NO A L5 S1 EPIDURAL SPACE  Final   Acid Fast Smear   Final    NO ACID FAST BACILLI SEEN Performed at [facility]    Culture   Final    CULTURE WILL BE EXAMINED FOR 6 WEEKS BEFORE ISSUING A FINAL REPORT Performed at [facility]    Report Status PENDING  Incomplete  AFB culture with smear     Status: None (Preliminary result)   Collection Time: 04/16/14  6:44 PM  Result Value Ref Range Status   Specimen Description WOUND BACK  Final   Special Requests NOB EPIDURAL SPACE  Final   Acid Fast Smear   Final    NO ACID FAST BACILLI SEEN Performed at [facility]    Culture   Final    CULTURE WILL BE EXAMINED FOR 6 WEEKS BEFORE ISSUING A FINAL REPORT Performed at [facility]    Report Status PENDING  Incomplete  AFB culture with smear     Status: None (Preliminary result)   Collection Time: 04/16/14  6:44 PM  Result Value Ref Range Status   Specimen Description WOUND BACK  Final   Special Requests NOC L5 S1 DISC SPACE  Final   Acid Fast Smear   Final    NO ACID FAST BACILLI SEEN Performed at [facility]    Culture   Final    CULTURE WILL BE EXAMINED FOR 6 WEEKS BEFORE ISSUING A FINAL REPORT Performed at [facility]    Report Status PENDING  Incomplete  AFB culture with  smear     Status: None (Preliminary result)   Collection Time: 04/16/14  6:44 PM  Result Value Ref Range Status   Specimen Description WOUND BACK  Final   Special Requests NOD L5 S1 DISC SPACE  Final   Acid Fast Smear   Final    NO ACID FAST BACILLI SEEN Performed at [facility]    Culture   Final    CULTURE WILL BE EXAMINED FOR 6 WEEKS BEFORE ISSUING A FINAL REPORT Performed at [facility]    Report Status PENDING  Incomplete    Studies/Results: No results found.    Assessment/Plan:  Principal Problem:   Diskitis Active Problems:   AIDS   Cirrhosis, non-alcoholic   Pancytopenia   Protein-calorie malnutrition, severe   Discitis   Abscess in epidural space of lumbar spine   Infectious discitis   Discitis of lumbar region   HIV (human immunodeficiency virus infection)   Osteomyelitis of lumbar spine   Fungal osteomyelitis   VRE (vancomycin resistant enterococcus) culture positive   Pain, hip   Left flank pain    Samuel Schultz is a 38 y.o. 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 We took him off antibiotics last week and he is now sp L5-S1 microsiscectomy with nerve root discetomy and biopsies mx for culture by Dr. Saintclair Halsted yesteday  #1 Thoracic and Lumbar Diskitis: persistent infection in L spine and new infection in T spine DESPITE MULTIPLE ROUNDS OF IV ANTIBIOTICS INCLUDING ANTIFUNGALS  GREATLY APPRECIATE DR. CRAM'S HELP HERE  And pt is sp Neurosurgery-HOPEFULLY THIS WILL HELP CONTROL HIS  INFECTION   I AM RESTARTING MICAFUNGIN, AND WE WILL FOLLOWUP CULTURES  I AM VERY SKEPTICAL THAT VRE IS PLAYER HERE AND DONT THINK WE NEED TO GO WITH DAPTOMYCIN AGAIN  HOWEVER I DO THINK WOULD BE PRUDENT TO COVER FOR COAG NEG STPH AND NV STREP WITH VANCOMYCIN. I WILL START VANCOMYCIN ONCE WE HAVE IMAGED HIP (SEE BELOW) AND ENSURED NO MORE SURGICAL OR BIOPSY TARGETS THERE    #2 Hip pain: likely referred pain but will get  MRI hip   #3 HIV: doing perfectly with suppression and undetectable viral load      LOS: 26 days   Samuel Schultz 04/18/2014, 6:46 PM

## 2014-04-18 NOTE — Progress Notes (Signed)
Physical Therapy Treatment Patient Details Name: Samuel Schultz MRN: [id number] DOB: 21-Jan-1976 Today's Date: 04/18/2014    History of Present Illness Pt is a 38 y.o. male with history of HIV/AIDS, lymphoma, nonalcoholic cirrhosis, coagulopathy, pancytopenia presented to the ER because of worsening back pain. Patient states that he has been having pain in his low back last few days it has been radiating to his right leg. Denies any fever chills. Patient was recently admitted for discitis and is on IV daptomycin and ceftriaxone. MRI L-spine shows epidural abscess L5-S1 area. On-call neurosurgeon Dr. Saintclair Halsted was consulted and Dr. Saintclair Halsted at this time feels patient does not have any cauda equina features.    PT Comments    Progressing steadily.  Pain has moderated.  Reinforced back care.  Follow Up Recommendations  SNF;Supervision/Assistance - 24 hour     Equipment Recommendations  None recommended by PT    Recommendations for Other Services       Precautions / Restrictions Precautions Precautions: Fall Precaution Comments: reinforced back precautions for safety and decreased pain Required Braces or Orthoses: Spinal Brace Spinal Brace: Thoracolumbosacral orthotic;Applied in sitting position    Mobility  Bed Mobility Overal bed mobility: Needs Assistance Bed Mobility: Rolling;Sidelying to Sit Rolling: Supervision Sidelying to sit: Supervision       General bed mobility comments: good technique  Transfers Overall transfer level: Needs assistance Equipment used: Rolling walker (2 wheeled) Transfers: Sit to/from Stand Sit to Stand: Min guard         General transfer comment: safe transfer technique  Ambulation/Gait Ambulation/Gait assistance: Supervision Ambulation Distance (Feet): 400 Feet Assistive device: Rolling walker (2 wheeled) Gait Pattern/deviations: Step-through pattern;Trunk flexed   Gait velocity interpretation: Below normal speed for age/gender General Gait  Details: guarded with mildly flexed posture.     Stairs            Wheelchair Mobility    Modified Rankin (Stroke Patients Only)       Balance Overall balance assessment: No apparent balance deficits (not formally assessed)                                  Cognition Arousal/Alertness: Awake/alert Behavior During Therapy: WFL for tasks assessed/performed Overall Cognitive Status: Within Functional Limits for tasks assessed         Following Commands: Follows one step commands consistently            Exercises      General Comments General comments (skin integrity, edema, etc.): Reinforced back care      Pertinent Vitals/Pain Pain Assessment: Faces Faces Pain Scale: Hurts little more Pain Location: incision and left hip Pain Intervention(s): Limited activity within patient's tolerance    Home Living                      Prior Function            PT Goals (current goals can now be found in the care plan section) Acute Rehab PT Goals Patient Stated Goal: Want to get better and out of here. PT Goal Formulation: With patient Time For Goal Achievement: 05/01/14 Potential to Achieve Goals: Good Progress towards PT goals: Progressing toward goals    Frequency  Min 3X/week    PT Plan Current plan remains appropriate;Frequency needs to be updated    Co-evaluation             End of Session  Activity Tolerance: Patient tolerated treatment well Patient left: in chair;with call bell/phone within reach     Time: 0254-2706 PT Time Calculation (min) (ACUTE ONLY): 25 min  Charges:  $Gait Training: 8-22 mins $Therapeutic Activity: 8-22 mins                    G Codes:      Ivyanna Sibert, Tessie Fass 04/18/2014, 4:52 PM 04/18/2014  Donnella Sham, PT [phone]  (pager)

## 2014-04-19 ENCOUNTER — Encounter: Payer: Self-pay | Admitting: Internal Medicine

## 2014-04-19 ENCOUNTER — Inpatient Hospital Stay (HOSPITAL_COMMUNITY): Payer: Medicaid Other

## 2014-04-19 LAB — WOUND CULTURE
CULTURE: NO GROWTH
CULTURE: NO GROWTH
Culture: NO GROWTH
Gram Stain: NONE SEEN
Gram Stain: NONE SEEN
Gram Stain: NONE SEEN

## 2014-04-19 MED ORDER — HYDROMORPHONE HCL 1 MG/ML IJ SOLN: 0.5000 mg | Freq: Two times a day (BID) | INTRAMUSCULAR | Status: DC | PRN

## 2014-04-19 MED ORDER — GADOBENATE DIMEGLUMINE 529 MG/ML IV SOLN
15.0000 mL | Freq: Once | INTRAVENOUS | Status: AC | PRN
  Administered 2014-04-19: 15 mL via INTRAVENOUS

## 2014-04-19 NOTE — Progress Notes (Signed)
Received call from Dr. Gerilyn Pilgrim re findings on MRI of the RIGHT Hip which show evidence of infectious septic arthritis.  Recommend orthopedic surgical consult. Per Dr. Gerilyn Pilgrim the fluid would be amenable for aspiration and if so an IR guided aspirate for cell count,  bacterial and fungal cultures could be helpful

## 2014-04-19 NOTE — Evaluation (Signed)
Occupational Therapy Evaluation Patient Details Name: Samuel Schultz MRN: [id number] DOB: 05-Feb-1976 Today's Date: 04/19/2014    History of Present Illness Pt is a 38 y.o. male with history of HIV/AIDS, lymphoma, nonalcoholic cirrhosis, coagulopathy, pancytopenia presented to the ER because of worsening back pain. Patient states that he has been having pain in his low back last few days it has been radiating to his right leg. Denies any fever chills. Patient was recently admitted for discitis and is on IV daptomycin and ceftriaxone. MRI L-spine shows epidural abscess L5-S1 area. On-call neurosurgeon Dr. Saintclair Halsted was consulted and Dr. Saintclair Halsted at this time feels patient does not have any cauda equina features.   Clinical Impression   Pt has been requiring assistance for ADL and mobility since April.  Pt continues to be dependent in LB ADL and could benefit from AE to increase independence and adherence to back precautions.  Pt can perform standing ADL, but fatigues easily.  He is motivated to be as independent as possible. Plans are for SNF upon discharge.  Will follow acutely.    Follow Up Recommendations  SNF    Equipment Recommendations       Recommendations for Other Services       Precautions / Restrictions Precautions Precautions: Fall Precaution Comments: instructed in back precautions related to ADL Required Braces or Orthoses: Spinal Brace Spinal Brace: Thoracolumbosacral orthotic;Applied in sitting position Restrictions Weight Bearing Restrictions: No      Mobility Bed Mobility Overal bed mobility: Needs Assistance Bed Mobility: Rolling;Sidelying to Sit;Sit to Sidelying Rolling: Supervision Sidelying to sit: Supervision     Sit to sidelying: Supervision General bed mobility comments: using log roll technique without cues  Transfers Overall transfer level: Needs assistance Equipment used: Rolling walker (2 wheeled) Transfers: Sit to/from Stand Sit to Stand: Min guard          General transfer comment: did not transfers, demonstrated sit to stand only    Balance                                            ADL Overall ADL's : Needs assistance/impaired Eating/Feeding: Independent;Bed level   Grooming: Wash/dry hands;Wash/dry face;Set up;Sitting   Upper Body Bathing: Minimal assitance;Sitting   Lower Body Bathing: Maximal assistance;Sit to/from stand   Upper Body Dressing : Sitting;Minimal assistance (total assist for TLSO)   Lower Body Dressing: Maximal assistance;Sit to/from stand   Toilet Transfer: Min guard;Ambulation;RW           Functional mobility during ADLs: Min guard;Rolling walker General ADL Comments: Pt is interested in use of AE.     Vision                     Perception     Praxis      Pertinent Vitals/Pain Pain Assessment: Faces Faces Pain Scale: Hurts a little bit Pain Location: incision, L hip Pain Descriptors / Indicators: Sore Pain Intervention(s): Repositioned;Premedicated before session;Monitored during session     Hand Dominance Right   Extremity/Trunk Assessment Upper Extremity Assessment Upper Extremity Assessment: Generalized weakness (no functional limitations)   Lower Extremity Assessment Lower Extremity Assessment: Generalized weakness   Cervical / Trunk Assessment Cervical / Trunk Assessment: Normal   Communication Communication Communication: No difficulties   Cognition Arousal/Alertness: Awake/alert Behavior During Therapy: Flat affect Overall Cognitive Status: Within Functional Limits for tasks assessed  General Comments       Exercises       Shoulder Instructions      Home Living Family/patient expects to be discharged to:: [place]: None   Additional Comments: Pt has used a RW for several months, assisted for LB ADL since June.      Prior  Functioning/Environment Level of Independence: Needs assistance  Gait / Transfers Assistance Needed: used walker April to July 2015 until [facility]  Went to [facility] center and has been wheelchair bound since August.  "Has fallen out of wheelchair x 2 as well when bending over or forgot to lock wheelchair.   ADL's / Homemaking Assistance Needed: assist for LB bathing and dressing at SNF        OT Diagnosis: Generalized weakness;Acute pain   OT Problem List: Decreased strength;Decreased activity tolerance;Impaired balance (sitting and/or standing);Decreased knowledge of use of DME or AE;Decreased knowledge of precautions;Pain   OT Treatment/Interventions: Self-care/ADL training;DME and/or AE instruction;Patient/family education    OT Goals(Current goals can be found in the care plan section) Acute Rehab OT Goals Patient Stated Goal: Want to get better and out of here. OT Goal Formulation: With patient Time For Goal Achievement: 05/03/14 Potential to Achieve Goals: Good ADL Goals Pt Will Perform Grooming: standing;with supervision Pt Will Perform Lower Body Bathing: sit to/from stand;with supervision;with adaptive equipment Pt Will Perform Lower Body Dressing: with supervision;with adaptive equipment;sit to/from stand Pt Will Transfer to Toilet: with supervision;ambulating;regular height toilet Pt Will Perform Toileting - Clothing Manipulation and hygiene: with supervision;sit to/from stand Additional ADL Goal #1: Pt will adhere to back precautions during ADL with use of AE as needed.  OT Frequency: Min 2X/week   Barriers to D/C:            Co-evaluation              End of Session    Activity Tolerance: Patient tolerated treatment well Patient left: in bed;with call bell/phone within reach;with bed alarm set   Time: 6568-1275 OT Time Calculation (min): 19 min Charges:  OT General Charges $OT Visit: 1 Procedure OT Evaluation $Initial OT Evaluation Tier I: 1  Procedure OT Treatments $Self Care/Home Management : 8-22 mins G-Codes:    Malka So 04/19/2014, 9:47 AM [phone]

## 2014-04-19 NOTE — Progress Notes (Signed)
[facility]   MYCAFUNGIN DAY #3 RESTART   Subjective: No new complaints   Antibiotics:  Anti-infectives    Start     Dose/Rate Route Frequency Ordered Stop   04/17/14 1000  micafungin (MYCAMINE) 150 mg in sodium chloride 0.9 % 100 mL IVPB     150 mg100 mL/hr over 1 Hours Intravenous Daily 04/17/14 0910     04/16/14 2359  ceFAZolin (ANCEF) IVPB 2 g/50 mL premix     2 g100 mL/hr over 30 Minutes Intravenous Every 8 hours 04/16/14 2055 04/17/14 1037   04/16/14 1852  bacitracin 50,000 Units in sodium chloride irrigation 0.9 % 500 mL irrigation  Status:  Discontinued       As needed 04/16/14 1853 04/16/14 1905   03/29/14 0000  DAPTOmycin 630 mg in sodium chloride 0.9 % 100 mL     630 mg225.2 mL/hr over 30 Minutes Intravenous Every 24 hours 03/29/14 1056     03/29/14 0000  micafungin 150 mg in sodium chloride 0.9 % 100 mL     150 mg100 mL/hr over 1 Hours Intravenous Daily 03/29/14 1056     03/25/14 0800  DAPTOmycin (CUBICIN) 630 mg in sodium chloride 0.9 % IVPB  Status:  Discontinued    Comments:  ~8 mg/kg   630 mg225.2 mL/hr over 30 Minutes Intravenous Every 24 hours 03/24/14 1337 04/10/14 1243   03/24/14 1400  micafungin (MYCAMINE) 150 mg in sodium chloride 0.9 % 100 mL IVPB  Status:  Discontinued     150 mg100 mL/hr over 1 Hours Intravenous Daily 03/24/14 1326 04/10/14 1243   03/24/14 1000  azithromycin (ZITHROMAX) tablet 1,200 mg  Status:  Discontinued     1,200 mg Oral Every Sat 03/24/14 0113 04/10/14 1243   03/24/14 1000  ceFEPIme (MAXIPIME) 2 g in dextrose 5 % 50 mL IVPB  Status:  Discontinued     2 g100 mL/hr over 30 Minutes Intravenous Every 12 hours 03/24/14 0113 03/24/14 1326   03/24/14 1000  fluconazole (DIFLUCAN) tablet 400 mg  Status:  Discontinued     400 mg Oral Daily 03/24/14 0113 03/24/14 1326   03/24/14 0800  Darunavir Ethanolate (PREZISTA) tablet 800 mg       800 mg Oral Daily with breakfast 03/24/14 0113     03/24/14 0800  ritonavir (NORVIR) capsule 100 mg     100 mg Oral Daily with breakfast 03/24/14 0113     03/24/14 0145  DAPTOmycin (CUBICIN) 630 mg in sodium chloride 0.9 % IVPB    Comments:  ~8 mg/kg   630 mg225.2 mL/hr over 30 Minutes Intravenous  Once 03/24/14 0140 03/24/14 0344   03/24/14 0112  atovaquone (MEPRON) 750 MG/5ML suspension 750 mg     750 mg Oral Daily 03/24/14 0113     03/24/14 0112  emtricitabine-tenofovir (TRUVADA) 200-300 MG per tablet 1 tablet     1 tablet Oral Daily at bedtime 03/24/14 0113        Medications: Scheduled Meds: . sodium chloride   Intravenous Once  . sodium chloride   Intravenous Once  . atovaquone  750 mg Oral Daily  . buPROPion  300 mg Oral Daily  . clonazePAM  1 mg Oral QHS  . Darunavir Ethanolate  800 mg Oral Q breakfast  . docusate sodium  100 mg Oral BID  . emtricitabine-tenofovir  1 tablet Oral QHS  . feeding supplement (ENSURE COMPLETE)  237 mL Oral BID BM  . feeding supplement (ENSURE)  1 Container  Oral TID BM  . gabapentin  400 mg Oral TID  . micafungin [facility]) IV  150 mg Intravenous Daily  . morphine  60 mg Oral Q12H  . polyethylene glycol  17 g Oral Daily  . ritonavir  100 mg Oral Q breakfast  . senna-docusate  2 tablet Oral Daily  . sodium chloride  3 mL Intravenous Q12H  . sucralfate  1 g Oral TID WC   Continuous Infusions: . sodium chloride 10 mL/hr at 04/16/14 2235  . sodium chloride     PRN Meds:.acetaminophen **OR** acetaminophen, ALPRAZolam, alum & mag hydroxide-simeth, antiseptic oral rinse, bisacodyl, cyclobenzaprine, HYDROmorphone (DILAUDID) injection, menthol-cetylpyridinium **OR** phenol, methocarbamol, metoCLOPramide (REGLAN) injection, morphine, ondansetron (ZOFRAN) IV, sodium chloride, sodium chloride, sodium phosphate, temazepam    Objective: Weight change:   Intake/Output Summary (Last 24 hours) at 04/19/14 1103 Last data filed at 04/19/14 0944   Gross per 24 hour  Intake    340 ml  Output      0 ml  Net    340 ml   Blood pressure 106/58, pulse 87, temperature 98.5 F (36.9 C), temperature source Oral, resp. rate 20, height 5\' 8"  (1.727 m), weight 150 lb (68.04 kg), SpO2 98 %. Temp:  [98 F (36.7 C)-99.2 F (37.3 C)] 98.5 F (36.9 C) (11/12 0537) Pulse Rate:  [87-92] 87 (11/12 0537) Resp:  [18-20] 20 (11/12 0537) BP: (102-119)/(51-58) 106/58 mmHg (11/12 0537) SpO2:  [97 %-98 %] 98 % (11/12 0537)  Physical Exam: General: Alert and awake, oriented x3, not in any acute distress. Sitting in chair with brace Neuro: nonfocal  CBC:  CBC Latest Ref Rng 04/18/2014 04/17/2014 04/16/2014  WBC 4.0 - 10.5 K/uL 2.1(L) 1.9(L) 2.0(L)  Hemoglobin 13.0 - 17.0 g/dL 8.1(L) 7.2(L) 7.3(L)  Hematocrit 39.0 - 52.0 % 24.3(L) 21.7(L) 21.7(L)  Platelets 150 - 400 K/uL 97(L) 90(L) 97(L)      BMET  Recent Labs  04/17/14 0459 04/18/14 0530  NA 134* 133*  K 3.9 3.5*  CL 102 101  CO2 21 21  GLUCOSE 63* 75  BUN 5* 7  CREATININE 0.50 0.58  CALCIUM 8.8 8.6     Liver Panel  No results for input(s): PROT, ALBUMIN, AST, ALT, ALKPHOS, BILITOT, BILIDIR, IBILI in the last 72 hours.     Sedimentation Rate No results for input(s): ESRSEDRATE in the last 72 hours. C-Reactive Protein No results for input(s): CRP in the last 72 hours.  Micro Results: Recent Results (from the past 720 hour(s))  Surgical pcr screen     Status: None   Collection Time: 03/24/14  1:51 AM  Result Value Ref Range Status   MRSA, PCR NEGATIVE NEGATIVE Final   Staphylococcus aureus NEGATIVE NEGATIVE Final    Comment:        The Xpert SA Assay (FDA approved for NASAL specimens in patients over 54 years of age), is one component of a comprehensive surveillance program.  Test performance has been validated by [employer] for patients greater than or equal to 33 year old. It is not intended to diagnose infection nor to guide or monitor treatment.   Surgical pcr screen     Status: None   Collection Time: 04/12/14  1:29 AM  Result Value Ref Range Status   MRSA, PCR NEGATIVE NEGATIVE Final   Staphylococcus aureus NEGATIVE NEGATIVE Final    Comment:        The Xpert SA Assay (FDA approved for NASAL specimens in patients over 49 years of age), is  one component of a comprehensive surveillance program.  Test performance has been validated by [facility] for patients greater than or equal to 59 year old. It is not intended to diagnose infection nor to guide or monitor treatment.   Surgical PCR screen     Status: None   Collection Time: 04/16/14  7:45 AM  Result Value Ref Range Status   MRSA, PCR NEGATIVE NEGATIVE Final   Staphylococcus aureus NEGATIVE NEGATIVE Final    Comment:        The Xpert SA Assay (FDA approved for NASAL specimens in patients over 27 years of age), is one component of a comprehensive surveillance program.  Test performance has been validated by [employer] for patients greater than or equal to 40 year old. It is not intended to diagnose infection nor to guide or monitor treatment.   Wound culture     Status: None   Collection Time: 04/16/14  6:42 PM  Result Value Ref Range Status   Specimen Description WOUND BACK  Final   Special Requests NOC L5 S1 DISC SPACE  Final   Gram Stain   Final    NO WBC SEEN NO SQUAMOUS EPITHELIAL CELLS SEEN NO ORGANISMS SEEN Performed at [facility]    Culture   Final    NO GROWTH 2 DAYS Performed at [facility]    Report Status 04/19/2014 FINAL  Final  Wound culture     Status: None   Collection Time: 04/16/14  6:42 PM  Result Value Ref Range Status   Specimen Description WOUND BACK  Final   Special Requests NO A L5 S1 EPIDURAL SPACE  Final   Gram Stain   Final    NO WBC SEEN NO SQUAMOUS EPITHELIAL CELLS SEEN NO ORGANISMS SEEN Performed at [facility]    Culture   Final    NO GROWTH 2 DAYS Performed at [facility]    Report Status 04/19/2014 FINAL  Final  Wound culture     Status: None   Collection Time: 04/16/14  6:42 PM  Result Value Ref Range Status   Specimen Description WOUND BACK  Final   Special Requests NOB L5 S1 EPIDURAL SPACE  Final   Gram Stain   Final    NO WBC SEEN NO SQUAMOUS EPITHELIAL CELLS SEEN NO ORGANISMS SEEN Performed at [facility]    Culture   Final    NO GROWTH 2 DAYS Performed at [facility]    Report Status 04/19/2014 FINAL  Final  Anaerobic culture     Status: None (Preliminary result)   Collection Time: 04/16/14  6:42 PM  Result Value Ref Range Status   Specimen Description WOUND BACK  Final   Special Requests NO A L5 S1 EPIDURAL SPACE  Final   Gram Stain   Final    NO WBC SEEN NO SQUAMOUS EPITHELIAL CELLS SEEN NO ORGANISMS SEEN Performed at [facility]    Culture   Final    NO ANAEROBES ISOLATED; CULTURE IN PROGRESS FOR 5 DAYS Performed at [facility]    Report Status PENDING  Incomplete  Anaerobic culture     Status: None (Preliminary result)   Collection Time: 04/16/14  6:42 PM  Result Value Ref Range Status   Specimen Description WOUND BACK  Final   Special Requests NO B L5 S1 EPIDURAL SPACE  Final   Gram Stain PENDING  Incomplete   Culture   Final  NO ANAEROBES ISOLATED; CULTURE IN PROGRESS FOR 5 DAYS Performed at [facility]    Report Status PENDING  Incomplete  Anaerobic culture     Status: None (Preliminary result)   Collection Time: 04/16/14  6:42 PM  Result Value Ref Range Status   Specimen Description WOUND BACK  Final   Special Requests NO C L5 S1 DISC SPACE  Final   Gram Stain   Final    NO WBC SEEN NO SQUAMOUS EPITHELIAL CELLS SEEN NO ORGANISMS SEEN Performed at [facility]    Culture   Final    NO ANAEROBES ISOLATED; CULTURE IN PROGRESS FOR 5 DAYS Performed at [facility]    Report Status PENDING  Incomplete  Anaerobic culture     Status: None  (Preliminary result)   Collection Time: 04/16/14  6:42 PM  Result Value Ref Range Status   Specimen Description WOUND BACK  Final   Special Requests NO D L5 S1 DISC SPACE  Final   Gram Stain   Final    NO WBC SEEN NO SQUAMOUS EPITHELIAL CELLS SEEN NO ORGANISMS SEEN Performed at [facility]    Culture   Final    NO ANAEROBES ISOLATED; CULTURE IN PROGRESS FOR 5 DAYS Performed at [facility]    Report Status PENDING  Incomplete  Wound culture     Status: None (Preliminary result)   Collection Time: 04/16/14  6:43 PM  Result Value Ref Range Status   Specimen Description WOUND BACK  Final   Special Requests NOD L5 S1 DISC SPACE  Final   Gram Stain   Final    RARE WBC PRESENT, PREDOMINANTLY MONONUCLEAR NO SQUAMOUS EPITHELIAL CELLS SEEN NO ORGANISMS SEEN Performed at [facility]    Culture   Final    NO GROWTH 1 DAY Performed at [facility]    Report Status PENDING  Incomplete  Fungus Culture with Smear     Status: None (Preliminary result)   Collection Time: 04/16/14  6:44 PM  Result Value Ref Range Status   Specimen Description WOUND BACK  Final   Special Requests NO A L5 S1 EPIDURAL SPACE  Final   Fungal Smear   Final    NO YEAST OR FUNGAL ELEMENTS SEEN Performed at [facility]    Culture   Final    CULTURE IN PROGRESS FOR FOUR WEEKS Performed at [facility]    Report Status PENDING  Incomplete  Fungus Culture with Smear     Status: None (Preliminary result)   Collection Time: 04/16/14  6:44 PM  Result Value Ref Range Status   Specimen Description WOUND BACK  Final   Special Requests NOB SWABS L5 S1 EPIDURAL SPACE  Final   Fungal Smear   Final    NO YEAST OR FUNGAL ELEMENTS SEEN Performed at [facility]    Culture   Final    CULTURE IN PROGRESS FOR FOUR WEEKS Performed at [facility]    Report Status PENDING  Incomplete  Fungus Culture with Smear     Status: None (Preliminary result)    Collection Time: 04/16/14  6:44 PM  Result Value Ref Range Status   Specimen Description WOUND BACK  Final   Special Requests NOC L5 S1 DISC SPACE  Final   Fungal Smear   Final    NO YEAST OR FUNGAL ELEMENTS SEEN Performed at [facility]    Culture   Final  CULTURE IN PROGRESS FOR FOUR WEEKS Performed at [facility]    Report Status PENDING  Incomplete  Fungus Culture with Smear     Status: None (Preliminary result)   Collection Time: 04/16/14  6:44 PM  Result Value Ref Range Status   Specimen Description WOUND BACK  Final   Special Requests NOD L5 S1 DISC SPACE  Final   Fungal Smear   Final    NO YEAST OR FUNGAL ELEMENTS SEEN Performed at [facility]    Culture   Final    CULTURE IN PROGRESS FOR FOUR WEEKS Performed at [facility]    Report Status PENDING  Incomplete  AFB culture with smear     Status: None (Preliminary result)   Collection Time: 04/16/14  6:44 PM  Result Value Ref Range Status   Specimen Description WOUND BACK  Final   Special Requests NO A L5 S1 EPIDURAL SPACE  Final   Acid Fast Smear   Final    NO ACID FAST BACILLI SEEN Performed at [facility]    Culture   Final    CULTURE WILL BE EXAMINED FOR 6 WEEKS BEFORE ISSUING A FINAL REPORT Performed at [facility]    Report Status PENDING  Incomplete  AFB culture with smear     Status: None (Preliminary result)   Collection Time: 04/16/14  6:44 PM  Result Value Ref Range Status   Specimen Description WOUND BACK  Final   Special Requests NOB EPIDURAL SPACE  Final   Acid Fast Smear   Final    NO ACID FAST BACILLI SEEN Performed at [facility]    Culture   Final    CULTURE WILL BE EXAMINED FOR 6 WEEKS BEFORE ISSUING A FINAL REPORT Performed at [facility]    Report Status PENDING  Incomplete  AFB culture with smear     Status: None (Preliminary result)   Collection Time: 04/16/14  6:44 PM  Result Value Ref Range Status    Specimen Description WOUND BACK  Final   Special Requests NOC L5 S1 DISC SPACE  Final   Acid Fast Smear   Final    NO ACID FAST BACILLI SEEN Performed at [facility]    Culture   Final    CULTURE WILL BE EXAMINED FOR 6 WEEKS BEFORE ISSUING A FINAL REPORT Performed at [facility]    Report Status PENDING  Incomplete  AFB culture with smear     Status: None (Preliminary result)   Collection Time: 04/16/14  6:44 PM  Result Value Ref Range Status   Specimen Description WOUND BACK  Final   Special Requests NOD L5 S1 DISC SPACE  Final   Acid Fast Smear   Final    NO ACID FAST BACILLI SEEN Performed at [facility]    Culture   Final    CULTURE WILL BE EXAMINED FOR 6 WEEKS BEFORE ISSUING A FINAL REPORT Performed at [facility]    Report Status PENDING  Incomplete    Studies/Results: No results found.    Assessment/Plan:  Principal Problem:   Diskitis Active Problems:   AIDS   Cirrhosis, non-alcoholic   Pancytopenia   Protein-calorie malnutrition, severe   Discitis   Abscess in epidural space of lumbar spine   Infectious discitis   Discitis of lumbar region   HIV (human immunodeficiency virus infection)   Osteomyelitis of lumbar spine   Fungal osteomyelitis   VRE (vancomycin  resistant enterococcus) culture positive   Pain, hip   Left flank pain   Hip pain   Discitis thoracic region    Samuel Schultz is a 38 y.o. 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 We took him off  antibiotics last week and he is now sp L5-S1 microsiscectomy with nerve root discetomy and biopsies mx for culture by Dr. Saintclair Halsted yesteday  #1 Thoracic and Lumbar Diskitis: persistent infection in L spine and new infection in T spine DESPITE MULTIPLE ROUNDS OF IV ANTIBIOTICS INCLUDING ANTIFUNGALS  GREATLY APPRECIATE DR. CRAM'S HELP HERE  And pt is sp Neurosurgery-HOPEFULLY THIS WILL HELP CONTROL HIS INFECTION   I AM RESTARTING MICAFUNGIN, AND WE WILL FOLLOWUP CULTURES  I AM VERY SKEPTICAL THAT VRE IS PLAYER HERE AND DONT THINK WE NEED TO GO WITH DAPTOMYCIN AGAIN  HOWEVER I DO THINK WOULD BE PRUDENT TO COVER FOR COAG NEG STPH AND NV STREP WITH VANCOMYCIN. I WILL START VANCOMYCIN ONCE WE HAVE IMAGED HIP (SEE BELOW) AND ENSURED NO MORE SURGICAL OR BIOPSY TARGETS THERE    #2 Hip pain: likely referred pain but will get  MRI hip   #3 HIV: doing perfectly with suppression and undetectable viral load      LOS: 27 days   Alcide Evener 04/19/2014, 11:03 AM

## 2014-04-19 NOTE — Clinical Social Work Note (Signed)
[facility] states that they have a private room available for the patient to admit on Friday. Level II PARR screener came to evaluate patient at noon today. I suspect we will receive the patient's PASRR number in the morning. CSW will wait to hear from MD/PA regarding results of MRI and whether patient will DC Friday. CSW attempted to speak with patient in room to notify of probably DC for Friday, but patient currently away at MRI. CSW will follow up in the morning. CSW has sent updated clinical information to receiving SNF. [facility]PO[facility] Rosaria Ferries has been updated.  Liz Beach MSW, [place], [place], [phone]

## 2014-04-19 NOTE — Progress Notes (Signed)
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 He was admitted to [facility], from [facility] rehabilitation facility.  He was recently in the hospital for treatment of L3-L4 discitis and abscess at the L5-S1 level. He was discharged on cefepime, daptomycin and fluconazole. In the nursing facility patient complained about worsening back pain, brought back to the ED and repeat MRI of the back showed worsening of L3-4 discitis and new discitis at the level of T10-11. He was admitted to the hospital for further evaluation, started on daptomycin and micafungin per ID. On 11/3 ID stopped his antbiotic and antfungal therapy and requested that Neurosurgery re-evaluate the patient and attempt to obtain deep cultures in order to target treatment.  Left-sided L5-S1 laminectomy and microdiscectomy originally planned for 04/12/2014, but delayed due to elevated INR. Patient eventually had surgery on 11/9 and his antibiotics were restarted on 11/10.   Subjective Less hip pain today.  Last BM yesterday.  No complaints.  No longer requiring any IV narcotics.  Assessment/Plan: Acute discitis:  Recent h/o L3-4 discitis and L5-S1 abscess. Came in with new discitis at T10-11, worsening L5-S1 abscess, and persistent L3-4 discitis.  Seen by ID here and placed on Micafungin and daptomycin 8 mg/kg. These were subsequently discontinued on 11/3 in preparation for deep biopsy.    Status post Left-sided L5-S1 laminectomy and microdiscectomy  on 11/9.   Following the surgery, infectious disease restarted micafungin. Currently operative wound cultures, fungal cultures, and AFB show no growth to date.  MRI left hip completed. Result pending.  If MRI is negative, the patient may be discharged on Vancomycin and Micafungin per ID.  Patient  ambulating with PT and walker.  Hx of VRE bacteremia in June 2015, but blood cultures neg this admit  Right Hip Arthritis with synovitis -concerning for septic arthritis -try to aspirate for culture by IR if ID is okay with this  -hold additional abx pending culture data Coagulopathy:  Secondary to Cirrhosis from R-CHOP therapy  Nausea with intermittent vomiting:   Resolved.  Suspect releated to narcotics and constipation. Now having BM's on senna.  Tolerating soft diet well.   Back Pain:   Secondary to diskitis with a component of anxiety.   On Klonopin QHS. Pain is overall better 6/10. Back brace that was added for support is helping him become more mobile.   Pain/Anxiety management regimen: Continue MS Contin 60 mg bid, MSIR 15 mg prn. Has Xanax 0.25 mg bid prn, klonopin qhs.    AIDS/HIV infection:   Absolute CD4 count is 40 on 01/27/14.   Viral load is undetectable.  Patient is on Truvada, Prezista and Norvir. Azithromycin for prophylaxis.   Infectious Disease is on board appreciate their assistance and recommendations.  Cirrhosis:   Reportedly secondary to previous lymphoma infiltration to the liver and R-CHOP chemotherapy.   Radiological evidence of cirrhosis and portal hypertension, with massive splenomegaly and esophageal varices.   With evidence of decompensation given coagulopathy  Pressure ulceration at sacrum  Air Overlay mattress in place.  Foam dressing.  Improved.  Appreciate wound care consult  Constipation:   Resolved.  On bowel regimen with Senna and Miralax.  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  Labs show stable leukopenia, anemia and thrombocytopenia.  Will  continue to monitor as needed while patient is in hospital.  Severe protein calorie malnutrition: Continue with diet supplements after discharge   History large  B-cell lymphoma 2009:   Metastasis to the back. Was treated with chemotherapy in 2009.   Bone marrow biopsy done on 11/02/2013 in the [facility], showed no evidence of recurrent disease.  DVT Prophylaxis:   SCDs  Code Status: Full Family Communication: discussed with patient  Disposition Plan: SNF when appropriate.   Consultants:  ID  Neurosurgery.  Procedures:  None  Antibiotics:  Micafungin 10/17>> 04/10/2014  Daptomycin continued since discharge last time >> 04/10/2014  Micafungin 04/17/2014 >>  Objective: Filed Vitals:   04/18/14 0607 04/18/14 1521 04/18/14 2058 04/19/14 0537  BP: 118/64 102/55 119/51 106/58  Pulse: 91 92 91 87  Temp: 98.8 F (37.1 C) 99.2 F (37.3 C) 98 F (36.7 C) 98.5 F (36.9 C)  TempSrc: Oral Oral Oral Oral  Resp: _0 Height:      Weight:      SpO2: 97% 97% 98% 98%    Intake/Output Summary (Last 24 hours) at 04/19/14 1600 Last data filed at 04/19/14 1427  Gross per 24 hour  Intake    580 ml  Output      0 ml  Net    580 ml   Filed Weights   03/30/14 0743 04/17/14 0636 04/18/14 0500  Weight: 77.9 kg (171 lb 11.8 oz) 68.13 kg (150 lb 3.2 oz) 68.04 kg (150 lb)    Exam: General:   no apparent distress, wd, male, pale complexion, pleasant. HEENT: no scleral icterus, mouth dry,  Neck: Supple, no JVD, no masses  Cardiovascular: RRR, S1 S2 auscultated, no rubs, murmurs or gallops.   Respiratory: Clear to auscultation bilaterally with equal chest rise Abdomen: Soft, nontender, nondistended, + bowel sounds Extremities: no edema   Data Reviewed: Basic Metabolic Panel:  Recent Labs Lab 04/13/14 0550 04/15/14 0508 04/16/14 0522 04/17/14 0459 04/18/14 0530  NA 134* 135* 135* 134* 133*  K 3.7 3.6* 3.4* 3.9 3.5*  CL 102 104 103 102 101  CO2 _1 GLUCOSE 100* 79 90 63* 75  BUN _2 5* 7  CREATININE 0.45* 0.71 0.61 0.50 0.58  CALCIUM 8.1* 8.4 8.3* 8.8 8.6   Liver Function Tests:  Recent Labs Lab  04/13/14 0550  AST 44*  ALT 16  ALKPHOS 91  BILITOT 1.8*  PROT 7.1  ALBUMIN 1.9*   CBC:  Recent Labs Lab 04/13/14 0550 04/15/14 0508 04/16/14 0522 04/17/14 0459 04/18/14 0530  WBC 2.6* 2.8* 2.0* 1.9* 2.1*  HGB 7.9* 7.9* 7.3* 7.2* 8.1*  HCT 24.0* 23.8* 21.7* 21.7* 24.3*  MCV 95.6 95.2 95.6 94.8 96.0  PLT 100* 93* 97* 90* 97*   Cardiac Enzymes:  Recent Labs Lab 04/15/14 0508  CKTOTAL 169    Studies: No results found.  Scheduled Meds: . sodium chloride   Intravenous Once  . sodium chloride   Intravenous Once  . atovaquone  750 mg Oral Daily  . buPROPion  300 mg Oral Daily  . clonazePAM  1 mg Oral QHS  . Darunavir Ethanolate  800 mg Oral Q breakfast  . docusate sodium  100 mg Oral BID  . emtricitabine-tenofovir  1 tablet Oral QHS  . feeding supplement (ENSURE COMPLETE)  237 mL Oral BID BM  . feeding supplement (ENSURE)  1 Container Oral TID BM  . gabapentin  400 mg Oral TID  . micafungin (  MYCAMINE) IV  150 mg Intravenous Daily  . morphine  60 mg Oral Q12H  . polyethylene glycol  17 g Oral Daily  . ritonavir  100 mg Oral Q breakfast  . senna-docusate  2 tablet Oral Daily  . sodium chloride  3 mL Intravenous Q12H  . sucralfate  1 g Oral TID WC   Continuous Infusions: . sodium chloride 10 mL/hr at 04/16/14 2235  . sodium chloride      Principal Problem:   Diskitis Active Problems:   AIDS   Cirrhosis, non-alcoholic   Pancytopenia   Protein-calorie malnutrition, severe   Discitis   Abscess in epidural space of lumbar spine   Infectious discitis   Discitis of lumbar region   HIV (human immunodeficiency virus infection)   Osteomyelitis of lumbar spine   Fungal osteomyelitis   VRE (vancomycin resistant enterococcus) culture positive   Pain, hip   Left flank pain   Hip pain   Discitis thoracic region  Time spent: 25 minutes  Marianne York, PA-C [facility] [phone]  If 7PM-7AM, please contact night-coverage at [email or web address],  password [email or web address] 04/19/2014, 4:00 PM  LOS: 27 days  

## 2014-04-20 ENCOUNTER — Inpatient Hospital Stay (HOSPITAL_COMMUNITY): Payer: Medicaid Other

## 2014-04-20 DIAGNOSIS — M00851 Arthritis due to other bacteria, right hip: Secondary | ICD-10-CM

## 2014-04-20 LAB — WOUND CULTURE: Culture: NO GROWTH

## 2014-04-20 LAB — SYNOVIAL CELL COUNT + DIFF, W/ CRYSTALS
CRYSTALS FLUID: NONE SEEN
Lymphocytes-Synovial Fld: 51 % — ABNORMAL HIGH (ref 0–20)
MONOCYTE-MACROPHAGE-SYNOVIAL FLUID: 9 % — AB (ref 50–90)
Neutrophil, Synovial: 40 % — ABNORMAL HIGH (ref 0–25)
WBC, SYNOVIAL: 175 /mm3 (ref 0–200)

## 2014-04-20 MED ORDER — VANCOMYCIN HCL IN DEXTROSE 1-5 GM/200ML-% IV SOLN
1000.0000 mg | Freq: Three times a day (TID) | INTRAVENOUS | Status: DC
  Administered 2014-04-21 – 2014-04-22 (×5): 1000 mg via INTRAVENOUS
  Filled 2014-04-20 (×7): qty 200

## 2014-04-20 MED ORDER — POTASSIUM CHLORIDE CRYS ER 20 MEQ PO TBCR
40.0000 meq | EXTENDED_RELEASE_TABLET | Freq: Once | ORAL | Status: AC
  Administered 2014-04-20: 40 meq via ORAL
  Filled 2014-04-20: qty 2

## 2014-04-20 MED ORDER — ATOVAQUONE 750 MG/5ML PO SUSP
1500.0000 mg | Freq: Every day | ORAL | Status: DC
  Administered 2014-04-23: 1500 mg via ORAL
  Filled 2014-04-20 (×3): qty 10

## 2014-04-20 MED ORDER — VANCOMYCIN HCL 10 G IV SOLR
1500.0000 mg | Freq: Once | INTRAVENOUS | Status: AC
  Administered 2014-04-20: 1500 mg via INTRAVENOUS
  Filled 2014-04-20: qty 1500

## 2014-04-20 MED ORDER — HYDROMORPHONE HCL 1 MG/ML IJ SOLN
1.0000 mg | Freq: Three times a day (TID) | INTRAMUSCULAR | Status: AC | PRN
Start: 2014-04-20 — End: 2014-04-21
  Administered 2014-04-21: 1 mg via INTRAVENOUS
  Filled 2014-04-20 (×2): qty 1

## 2014-04-20 MED ORDER — AZITHROMYCIN 600 MG PO TABS
1200.0000 mg | ORAL_TABLET | ORAL | Status: DC
  Administered 2014-04-20: 1200 mg via ORAL
  Filled 2014-04-20: qty 2

## 2014-04-20 MED ORDER — IOHEXOL 180 MG/ML  SOLN
20.0000 mL | Freq: Once | INTRAMUSCULAR | Status: AC | PRN
  Administered 2014-04-20: 2 mL via INTRA_ARTICULAR

## 2014-04-20 NOTE — Progress Notes (Signed)
Physical Therapy Treatment Patient Details Name: Samuel Schultz MRN: [id number] DOB: November 12, 1975 Today's Date: 04/20/2014    History of Present Illness Pt is a 38 y.o. male with history of HIV/AIDS, lymphoma, nonalcoholic cirrhosis, coagulopathy, pancytopenia presented to the ER because of worsening back pain. Patient states that he has been having pain in his low back last few days it has been radiating to his right leg. Denies any fever chills. Patient was recently admitted for discitis and is on IV daptomycin and ceftriaxone. MRI L-spine shows epidural abscess L5-S1 area. On-call neurosurgeon Dr. Saintclair Halsted was consulted and Dr. Saintclair Halsted at this time feels patient does not have any cauda equina features.    PT Comments    Progressing well.  Affect improved, voice stronger, moving guardedly with IV pole without brace.  Follow Up Recommendations  SNF;Supervision/Assistance - 24 hour     Equipment Recommendations  None recommended by PT    Recommendations for Other Services       Precautions / Restrictions Precautions Precautions: Fall Precaution Comments: instructed in back precautions related to ADL Required Braces or Orthoses:  (brace for comfort only per Dr Saintclair Halsted  11/13)    Mobility  Bed Mobility Overal bed mobility: Needs Assistance Bed Mobility: Rolling;Sidelying to Sit;Sit to Sidelying Rolling: Modified independent (Device/Increase time) Sidelying to sit: Supervision     Sit to sidelying: Supervision General bed mobility comments: good technique, but slow  Transfers Overall transfer level: Needs assistance   Transfers: Sit to/from Stand Sit to Stand: Min guard         General transfer comment: safe technique  Ambulation/Gait Ambulation/Gait assistance: Min guard Ambulation Distance (Feet): 400 Feet Assistive device:  (pushing IV pole) Gait Pattern/deviations: Step-through pattern Gait velocity: slow   General Gait Details: guarded and steady until fatigue and then  mildly unsteady   Stairs            Wheelchair Mobility    Modified Rankin (Stroke Patients Only)       Balance Overall balance assessment: Needs assistance Sitting-balance support: No upper extremity supported Sitting balance-Leahy Scale: Good Sitting balance - Comments: prefers to use UE's for decreased pain, but able sit EOB without UE assist   Standing balance support: No upper extremity supported Standing balance-Leahy Scale: Fair                      Cognition Arousal/Alertness: Awake/alert Behavior During Therapy: WFL for tasks assessed/performed Overall Cognitive Status: Within Functional Limits for tasks assessed         Following Commands: Follows multi-step commands with increased time            Exercises      General Comments General comments (skin integrity, edema, etc.): Reinforcing back care.      Pertinent Vitals/Pain Pain Assessment: 0-10 Pain Score: 8  Faces Pain Scale: Hurts even more Pain Location: R hip and back Pain Descriptors / Indicators: Grimacing Pain Intervention(s): Monitored during session    Home Living                      Prior Function            PT Goals (current goals can now be found in the care plan section) Acute Rehab PT Goals Patient Stated Goal: Want to get better and out of here. PT Goal Formulation: With patient Time For Goal Achievement: 05/01/14 Potential to Achieve Goals: Good Progress towards PT goals: Progressing toward goals  Frequency  Min 3X/week    PT Plan Current plan remains appropriate    Co-evaluation             End of Session   Activity Tolerance: Patient tolerated treatment well Patient left: in bed;with call bell/phone within reach     Time: 1549-1613 PT Time Calculation (min) (ACUTE ONLY): 24 min  Charges:  $Gait Training: 8-22 mins $Therapeutic Activity: 8-22 mins                    G Codes:      Adelaida Reindel, Tessie Fass 04/20/2014, 4:31  PM 04/20/2014  Donnella Sham, PT [phone]  (pager)

## 2014-04-20 NOTE — Clinical Social Work Note (Signed)
Patient not ready for DC today. [facility] states that they will be holding the patient's private room for his admission on Monday. CSW will continue to follow.   Liz Beach MSW, [place], [place], [phone]

## 2014-04-20 NOTE — Progress Notes (Signed)
OT Cancellation Note  Patient Details Name: Samuel Schultz MRN: [id number] DOB: 11-Jun-1975   Cancelled Treatment:    Reason Eval/Treat Not Completed: Patient at procedure or test/ unavailable--down for a biopsy. Will see later today or tomorrow as schedule allows.  Almon Register [phone] 04/20/2014, 1:35 PM

## 2014-04-20 NOTE — Progress Notes (Signed)
[facility]   MYCAFUNGIN DAY #4  RESTART   Subjective: Was Upset to find out that he had septic arthritis of his right hip   Antibiotics:  Anti-infectives    Start     Dose/Rate Route Frequency Ordered Stop   04/21/14 1000  atovaquone (MEPRON) 750 MG/5ML suspension 1,500 mg     1,500 mg Oral Daily 04/20/14 1227     04/17/14 1000  micafungin (MYCAMINE) 150 mg in sodium chloride 0.9 % 100 mL IVPB     150 mg100 mL/hr over 1 Hours Intravenous Daily 04/17/14 0910     04/16/14 2359  ceFAZolin (ANCEF) IVPB 2 g/50 mL premix     2 g100 mL/hr over 30 Minutes Intravenous Every 8 hours 04/16/14 2055 04/17/14 1037   04/16/14 1852  bacitracin 50,000 Units in sodium chloride irrigation 0.9 % 500 mL irrigation  Status:  Discontinued       As needed 04/16/14 1853 04/16/14 1905   03/29/14 0000  DAPTOmycin 630 mg in sodium chloride 0.9 % 100 mL     630 mg225.2 mL/hr over 30 Minutes Intravenous Every 24 hours 03/29/14 1056     03/29/14 0000  micafungin 150 mg in sodium chloride 0.9 % 100 mL     150 mg100 mL/hr over 1 Hours Intravenous Daily 03/29/14 1056     03/25/14 0800  DAPTOmycin (CUBICIN) 630 mg in sodium chloride 0.9 % IVPB  Status:  Discontinued    Comments:  ~8 mg/kg   630 mg225.2 mL/hr over 30 Minutes Intravenous Every 24 hours 03/24/14 1337 04/10/14 1243   03/24/14 1400  micafungin (MYCAMINE) 150 mg in sodium chloride 0.9 % 100 mL IVPB  Status:  Discontinued     150 mg100 mL/hr over 1 Hours Intravenous Daily 03/24/14 1326 04/10/14 1243   03/24/14 1000  azithromycin (ZITHROMAX) tablet 1,200 mg  Status:  Discontinued     1,200 mg Oral Every Sat 03/24/14 0113 04/10/14 1243   03/24/14 1000  ceFEPIme (MAXIPIME) 2 g in dextrose 5 % 50 mL IVPB  Status:  Discontinued     2 g100 mL/hr over 30 Minutes Intravenous Every 12 hours 03/24/14 0113 03/24/14 1326   03/24/14 1000  fluconazole  (DIFLUCAN) tablet 400 mg  Status:  Discontinued     400 mg Oral Daily 03/24/14 0113 03/24/14 1326   03/24/14 0800  Darunavir Ethanolate (PREZISTA) tablet 800 mg     800 mg Oral Daily with breakfast 03/24/14 0113     03/24/14 0800  ritonavir (NORVIR) capsule 100 mg     100 mg Oral Daily with breakfast 03/24/14 0113     03/24/14 0145  DAPTOmycin (CUBICIN) 630 mg in sodium chloride 0.9 % IVPB    Comments:  ~8 mg/kg   630 mg225.2 mL/hr over 30 Minutes Intravenous  Once 03/24/14 0140 03/24/14 0344   03/24/14 0112  atovaquone (MEPRON) 750 MG/5ML suspension 750 mg  Status:  Discontinued     750 mg Oral Daily 03/24/14 0113 04/20/14 1227   03/24/14 0112  emtricitabine-tenofovir (TRUVADA) 200-300 MG per tablet 1 tablet     1 tablet Oral Daily at bedtime 03/24/14 0113        Medications: Scheduled Meds: . [START ON 04/21/2014] atovaquone  1,500 mg Oral Daily  . buPROPion  300 mg Oral Daily  . clonazePAM  1 mg Oral QHS  . Darunavir Ethanolate  800 mg Oral Q breakfast  . docusate sodium  100 mg Oral BID  .  emtricitabine-tenofovir  1 tablet Oral QHS  . feeding supplement (ENSURE COMPLETE)  237 mL Oral BID BM  . feeding supplement (ENSURE)  1 Container Oral TID BM  . gabapentin  400 mg Oral TID  . micafungin [facility]) IV  150 mg Intravenous Daily  . morphine  60 mg Oral Q12H  . polyethylene glycol  17 g Oral Daily  . ritonavir  100 mg Oral Q breakfast  . senna-docusate  2 tablet Oral Daily  . sucralfate  1 g Oral TID WC   Continuous Infusions: . sodium chloride 10 mL/hr at 04/16/14 2235   PRN Meds:.acetaminophen **OR** acetaminophen, ALPRAZolam, alum & mag hydroxide-simeth, antiseptic oral rinse, bisacodyl, cyclobenzaprine, HYDROmorphone (DILAUDID) injection, menthol-cetylpyridinium **OR** phenol, methocarbamol, metoCLOPramide (REGLAN) injection, morphine, ondansetron (ZOFRAN) IV, sodium chloride, sodium phosphate, temazepam    Objective: Weight change:   Intake/Output Summary (Last 24  hours) at 04/20/14 1530 Last data filed at 04/20/14 1416  Gross per 24 hour  Intake    120 ml  Output   1050 ml  Net   -930 ml   Blood pressure 121/59, pulse 94, temperature 98.6 F (37 C), temperature source Oral, resp. rate 24, height 5\' 8"  (1.727 m), weight 159 lb 6.3 oz (72.3 kg), SpO2 99 %. Temp:  [98.6 F (37 C)-98.8 F (37.1 C)] 98.6 F (37 C) (11/13 1411) Pulse Rate:  [86-94] 94 (11/13 1411) Resp:  [16-24] 24 (11/13 1411) BP: (107-121)/(57-59) 121/59 mmHg (11/13 1411) SpO2:  [95 %-99 %] 99 % (11/13 1411) Weight:  [159 lb 6.3 oz (72.3 kg)] 159 lb 6.3 oz (72.3 kg) (11/13 0544)  Physical Exam: General: Alert and awake, oriented x3, not in any acute distress. Sitting in chair with brace Neuro: nonfocal  CBC:  CBC Latest Ref Rng 04/18/2014 04/17/2014 04/16/2014  WBC 4.0 - 10.5 K/uL 2.1(L) 1.9(L) 2.0(L)  Hemoglobin 13.0 - 17.0 g/dL 8.1(L) 7.2(L) 7.3(L)  Hematocrit 39.0 - 52.0 % 24.3(L) 21.7(L) 21.7(L)  Platelets 150 - 400 K/uL 97(L) 90(L) 97(L)      BMET  Recent Labs  04/18/14 0530  NA 133*  K 3.5*  CL 101  CO2 21  GLUCOSE 75  BUN 7  CREATININE 0.58  CALCIUM 8.6     Liver Panel  No results for input(s): PROT, ALBUMIN, AST, ALT, ALKPHOS, BILITOT, BILIDIR, IBILI in the last 72 hours.     Sedimentation Rate No results for input(s): ESRSEDRATE in the last 72 hours. C-Reactive Protein No results for input(s): CRP in the last 72 hours.  Micro Results: Recent Results (from the past 720 hour(s))  Surgical pcr screen     Status: None   Collection Time: 03/24/14  1:51 AM  Result Value Ref Range Status   MRSA, PCR NEGATIVE NEGATIVE Final   Staphylococcus aureus NEGATIVE NEGATIVE Final    Comment:        The Xpert SA Assay (FDA approved for NASAL specimens in patients over 78 years of age), is one component of a comprehensive surveillance program.  Test performance has been validated by [employer] for patients greater than or equal to 57 year  old. It is not intended to diagnose infection nor to guide or monitor treatment.  Surgical pcr screen     Status: None   Collection Time: 04/12/14  1:29 AM  Result Value Ref Range Status   MRSA, PCR NEGATIVE NEGATIVE Final   Staphylococcus aureus NEGATIVE NEGATIVE Final    Comment:        The Xpert SA Assay (  FDA approved for NASAL specimens in patients over 22 years of age), is one component of a comprehensive surveillance program.  Test performance has been validated by [employer] for patients greater than or equal to 62 year old. It is not intended to diagnose infection nor to guide or monitor treatment.   Surgical PCR screen     Status: None   Collection Time: 04/16/14  7:45 AM  Result Value Ref Range Status   MRSA, PCR NEGATIVE NEGATIVE Final   Staphylococcus aureus NEGATIVE NEGATIVE Final    Comment:        The Xpert SA Assay (FDA approved for NASAL specimens in patients over 47 years of age), is one component of a comprehensive surveillance program.  Test performance has been validated by [employer] for patients greater than or equal to 40 year old. It is not intended to diagnose infection nor to guide or monitor treatment.   Wound culture     Status: None   Collection Time: 04/16/14  6:42 PM  Result Value Ref Range Status   Specimen Description WOUND BACK  Final   Special Requests NOC L5 S1 DISC SPACE  Final   Gram Stain   Final    NO WBC SEEN NO SQUAMOUS EPITHELIAL CELLS SEEN NO ORGANISMS SEEN Performed at [facility]    Culture   Final    NO GROWTH 2 DAYS Performed at [facility]    Report Status 04/19/2014 FINAL  Final  Wound culture     Status: None   Collection Time: 04/16/14  6:42 PM  Result Value Ref Range Status   Specimen Description WOUND BACK  Final   Special Requests NO A L5 S1 EPIDURAL SPACE  Final   Gram Stain   Final    NO WBC SEEN NO SQUAMOUS EPITHELIAL CELLS SEEN NO ORGANISMS SEEN Performed at [facility]    Culture   Final    NO GROWTH 2 DAYS Performed at [facility]    Report Status 04/19/2014 FINAL  Final  Wound culture     Status: None   Collection Time: 04/16/14  6:42 PM  Result Value Ref Range Status   Specimen Description WOUND BACK  Final   Special Requests NOB L5 S1 EPIDURAL SPACE  Final   Gram Stain   Final    NO WBC SEEN NO SQUAMOUS EPITHELIAL CELLS SEEN NO ORGANISMS SEEN Performed at [facility]    Culture   Final    NO GROWTH 2 DAYS Performed at [facility]    Report Status 04/19/2014 FINAL  Final  Anaerobic culture     Status: None (Preliminary result)   Collection Time: 04/16/14  6:42 PM  Result Value Ref Range Status   Specimen Description WOUND BACK  Final   Special Requests NO A L5 S1 EPIDURAL SPACE  Final   Gram Stain   Final    NO WBC SEEN NO SQUAMOUS EPITHELIAL CELLS SEEN NO ORGANISMS SEEN Performed at [facility]    Culture   Final    NO ANAEROBES ISOLATED; CULTURE IN PROGRESS FOR 5 DAYS Performed at [facility]    Report Status PENDING  Incomplete  Anaerobic culture     Status: None (Preliminary result)   Collection Time: 04/16/14  6:42 PM  Result Value Ref Range Status   Specimen Description WOUND BACK  Final   Special Requests NO B L5 S1 EPIDURAL SPACE  Final   Gram  Stain PENDING  Incomplete   Culture   Final    NO ANAEROBES ISOLATED; CULTURE IN PROGRESS FOR 5 DAYS Performed at [facility]    Report Status PENDING  Incomplete  Anaerobic culture     Status: None (Preliminary result)   Collection Time: 04/16/14  6:42 PM  Result Value Ref Range Status   Specimen Description WOUND BACK  Final   Special Requests NO C L5 S1 DISC SPACE  Final   Gram Stain   Final    NO WBC SEEN NO SQUAMOUS EPITHELIAL CELLS SEEN NO ORGANISMS SEEN Performed at [facility]    Culture   Final    NO ANAEROBES ISOLATED; CULTURE IN PROGRESS FOR 5 DAYS Performed at [facility]     Report Status PENDING  Incomplete  Anaerobic culture     Status: None (Preliminary result)   Collection Time: 04/16/14  6:42 PM  Result Value Ref Range Status   Specimen Description WOUND BACK  Final   Special Requests NO D L5 S1 DISC SPACE  Final   Gram Stain   Final    NO WBC SEEN NO SQUAMOUS EPITHELIAL CELLS SEEN NO ORGANISMS SEEN Performed at [facility]    Culture   Final    NO ANAEROBES ISOLATED; CULTURE IN PROGRESS FOR 5 DAYS Performed at [facility]    Report Status PENDING  Incomplete  Wound culture     Status: None   Collection Time: 04/16/14  6:43 PM  Result Value Ref Range Status   Specimen Description WOUND BACK  Final   Special Requests NOD L5 S1 DISC SPACE  Final   Gram Stain   Final    RARE WBC PRESENT, PREDOMINANTLY MONONUCLEAR NO SQUAMOUS EPITHELIAL CELLS SEEN NO ORGANISMS SEEN Performed at [facility]    Culture   Final    NO GROWTH 2 DAYS Performed at [facility]    Report Status 04/20/2014 FINAL  Final  Fungus Culture with Smear     Status: None (Preliminary result)   Collection Time: 04/16/14  6:44 PM  Result Value Ref Range Status   Specimen Description WOUND BACK  Final   Special Requests NO A L5 S1 EPIDURAL SPACE  Final   Fungal Smear   Final    NO YEAST OR FUNGAL ELEMENTS SEEN Performed at [facility]    Culture   Final    CULTURE IN PROGRESS FOR FOUR WEEKS Performed at [facility]    Report Status PENDING  Incomplete  Fungus Culture with Smear     Status: None (Preliminary result)   Collection Time: 04/16/14  6:44 PM  Result Value Ref Range Status   Specimen Description WOUND BACK  Final   Special Requests NOB SWABS L5 S1 EPIDURAL SPACE  Final   Fungal Smear   Final    NO YEAST OR FUNGAL ELEMENTS SEEN Performed at [facility]    Culture   Final    CULTURE IN PROGRESS FOR FOUR WEEKS Performed at [facility]    Report Status PENDING  Incomplete  Fungus Culture  with Smear     Status: None (Preliminary result)   Collection Time: 04/16/14  6:44 PM  Result Value Ref Range Status   Specimen Description WOUND BACK  Final   Special Requests NOC L5 S1 DISC SPACE  Final   Fungal Smear   Final    NO YEAST OR FUNGAL ELEMENTS SEEN Performed at [facility]    Culture   Final    CULTURE IN PROGRESS FOR FOUR WEEKS Performed at [facility]    Report Status PENDING  Incomplete  Fungus Culture with Smear     Status: None (Preliminary result)   Collection Time: 04/16/14  6:44 PM  Result Value Ref Range Status   Specimen Description WOUND BACK  Final   Special Requests NOD L5 S1 DISC SPACE  Final   Fungal Smear   Final    NO YEAST OR FUNGAL ELEMENTS SEEN Performed at [facility]    Culture   Final    CULTURE IN PROGRESS FOR FOUR WEEKS Performed at [facility]    Report Status PENDING  Incomplete  AFB culture with smear     Status: None (Preliminary result)   Collection Time: 04/16/14  6:44 PM  Result Value Ref Range Status   Specimen Description WOUND BACK  Final   Special Requests NO A L5 S1 EPIDURAL SPACE  Final   Acid Fast Smear   Final    NO ACID FAST BACILLI SEEN Performed at [facility]    Culture   Final    CULTURE WILL BE EXAMINED FOR 6 WEEKS BEFORE ISSUING A FINAL REPORT Performed at [facility]    Report Status PENDING  Incomplete  AFB culture with smear     Status: None (Preliminary result)   Collection Time: 04/16/14  6:44 PM  Result Value Ref Range Status   Specimen Description WOUND BACK  Final   Special Requests NOB EPIDURAL SPACE  Final   Acid Fast Smear   Final    NO ACID FAST BACILLI SEEN Performed at [facility]    Culture   Final    CULTURE WILL BE EXAMINED FOR 6 WEEKS BEFORE ISSUING A FINAL REPORT Performed at [facility]    Report Status PENDING  Incomplete  AFB culture with smear     Status: None (Preliminary result)   Collection Time:  04/16/14  6:44 PM  Result Value Ref Range Status   Specimen Description WOUND BACK  Final   Special Requests NOC L5 S1 DISC SPACE  Final   Acid Fast Smear   Final    NO ACID FAST BACILLI SEEN Performed at [facility]    Culture   Final    CULTURE WILL BE EXAMINED FOR 6 WEEKS BEFORE ISSUING A FINAL REPORT Performed at [facility]    Report Status PENDING  Incomplete  AFB culture with smear     Status: None (Preliminary result)   Collection Time: 04/16/14  6:44 PM  Result Value Ref Range Status   Specimen Description WOUND BACK  Final   Special Requests NOD L5 S1 DISC SPACE  Final   Acid Fast Smear   Final    NO ACID FAST BACILLI SEEN Performed at [facility]    Culture   Final    CULTURE WILL BE EXAMINED FOR 6 WEEKS BEFORE ISSUING A FINAL REPORT Performed at [facility]    Report Status PENDING  Incomplete    Studies/Results: Mr Hip Right W Wo Contrast  04/19/2014   CLINICAL DATA:  RIGHT hip pain. HIV. History of lymphoma. Coagulopathy.  EXAM: MRI OF THE BILATERAL HIPS WITHOUT AND WITH CONTRAST  TECHNIQUE: Multiplanar, multisequence MR imaging was performed both before and after administration of intravenous contrast.  CONTRAST:  49mL MULTIHANCE GADOBENATE DIMEGLUMINE 529 MG/ML IV SOLN  COMPARISON:  04/11/2014 radiographs. MRI 04/23/2014. CT 03/23/2014.  FINDINGS: Severe arthritis of the RIGHT hip. Small effusion is present with diffuse synovitis. The joint capsule enhances diffusely and there is bone marrow edema in the acetabulum and femoral head. The LEFT hip appears within normal limits. Nonspecific edema is present along both iliac wings.  There is a benign-appearing lesion in the supra-acetabular LEFT ilium extending to the posterior column measuring 24 mm x 14 mm. Comparing to CT scans dating back to 10/16/2013, this is compatible with a benign chondroid lesion. This shows low level enhancement after contrast administration. Similar lesion is  present in the RIGHT medial iliac bone. 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  There is mild muscular edema around the RIGHT hip which is probably reactive. No soft tissue abscess. Muscular edema is also present in the LEFT proximal rectus femoris muscle. The cause for this is unclear and could represent strain. Infectious myositis is a consideration. Common hamstring origins appear normal. The pelvic rings are intact. Urinary bladder and visceral pelvis grossly within normal limits.  L5-S1 osteomyelitis/discitis noted. Similar but much less pronounced changes are present at L3-L4.  IMPRESSION: 1. RIGHT hip arthritis with mild narrowing of the joint space and severe synovitis with only a small amount of joint effusion. This is most concerning for septic arthritis in a patient with discitis/osteomyelitis and HIV. Inflammatory arthritis is in the differential considerations. 2. Critical Value/emergent results were called by telephone at the time of interpretation on 04/19/2014 at 4:36 pm to Dr. Rhina Brackett DAM , who verbally acknowledged these results. We discussed the differential considerations and given the patient's other sites of infection, this is most likely an atypical presentation of septic arthritis. Although aspiration may not yield because of the scant amount of joint fluid, it could be useful. 3. Normal appearance of the LEFT hip.   Electronically Signed   By: Dereck Ligas M.D.   On: 04/19/2014 16:43   Mr Hip Left W Wo Contrast  04/19/2014   CLINICAL DATA:  RIGHT hip pain. HIV. History of lymphoma. Coagulopathy.  EXAM: MRI OF THE BILATERAL HIPS WITHOUT AND WITH CONTRAST  TECHNIQUE: Multiplanar, multisequence MR imaging was performed both before and after administration of intravenous contrast.  CONTRAST:  37mL MULTIHANCE GADOBENATE DIMEGLUMINE 529 MG/ML IV SOLN  COMPARISON:  04/11/2014 radiographs. MRI 04/23/2014. CT 03/23/2014.  FINDINGS:  Severe arthritis of the RIGHT hip. Small effusion is present with diffuse synovitis. The joint capsule enhances diffusely and there is bone marrow edema in the acetabulum and femoral head. The LEFT hip appears within normal limits. Nonspecific edema is present along both iliac wings.  There is a benign-appearing lesion in the supra-acetabular LEFT ilium extending to the posterior column measuring 24 mm x 14 mm. Comparing to CT scans dating back to 10/16/2013, this is compatible with a benign chondroid lesion. This shows low level enhancement after contrast administration. Similar lesion is present in the RIGHT medial iliac bone. 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  There is mild muscular edema around the RIGHT hip which is probably reactive. No soft tissue abscess. Muscular edema is also present in the LEFT proximal rectus femoris muscle. The cause for this is unclear and could represent strain. Infectious myositis is a consideration. Common hamstring origins appear normal. The pelvic rings are intact. Urinary bladder and visceral pelvis grossly within normal limits.  L5-S1 osteomyelitis/discitis noted. Similar but much less pronounced changes are present at  L3-L4.  IMPRESSION: 1. RIGHT hip arthritis with mild narrowing of the joint space and severe synovitis with only a small amount of joint effusion. This is most concerning for septic arthritis in a patient with discitis/osteomyelitis and HIV. Inflammatory arthritis is in the differential considerations. 2. Critical Value/emergent results were called by telephone at the time of interpretation on 04/19/2014 at 4:36 pm to Dr. Rhina Brackett DAM , who verbally acknowledged these results. We discussed the differential considerations and given the patient's other sites of infection, this is most likely an atypical presentation of septic arthritis. Although aspiration may not yield because of the scant amount of  joint fluid, it could be useful. 3. Normal appearance of the LEFT hip.   Electronically Signed   By: Dereck Ligas M.D.   On: 04/19/2014 16:43   Dg Fluoro Guide Ndl Plc/bx  04/20/2014   CLINICAL DATA:  RIGHT hip arthritis. Septic arthritis. Patient with HIV and discitis/osteomyelitis.  EXAM: FLUOROSCOPICALLY GUIDED RIGHT hip ASPIRATION.  FLUOROSCOPY TIME:  0 min 19 seconds  PROCEDURE: After a thorough discussion of risks and benefits of the procedure including the general risks of bleeding, infection, injury to nerves, blood vessels, adjacent structures, and allergic reaction, verbal and written consent was obtained. Verbal consent was obtained by Dr. Gerilyn Pilgrim. Specific risks of this procedure included false positive and false negative results and introduction of infection into the joint. Time-out form was completed when appropriate.  The joint was localized under fluoroscopy. A skin entry site anterior to the joint was chosen and marked. The skin was prepped and draped in the usual sterile fashion with Betadine soap. Local anesthesia and deep anesthesia was provided with 1% lidocaine without epinephrine. Careful attention was paid not to inject bacteriostatic lidocaine into the joint. Under fluoroscopic guidance 3.5 inch 20 gauge spinal needle was advanced into the joint. Vigorous aspiration was performed which yielded nothing initially.  Subsequently, injection of 1 ml of omnipaque 180 contrast agent into the joint showed opacification intra-articular placement. This was subsequently aspirated. 8 mL of sterile saline was injected into the joint. This was after intra-articular needle position was confirmed. Subsequently, this was aspirated yielding 2 mL of clear blood-tinged fluid. The needle was removed and a sterile dressing applied.  IMPRESSION: 1. Successful fluoroscopically guided RIGHT hip aspiration. 2. Dry tap. Saline lavage was performed after confirming needle position with contrast injection. 3. 2 mL  aspirated after saline lavage and sent to laboratory for analysis.   Electronically Signed   By: Dereck Ligas M.D.   On: 04/20/2014 14:27      Assessment/Plan:  Principal Problem:   Diskitis Active Problems:   AIDS   Cirrhosis, non-alcoholic   Pancytopenia   Protein-calorie malnutrition, severe   Discitis   Abscess in epidural space of lumbar spine   Infectious discitis   Discitis of lumbar region   HIV (human immunodeficiency virus infection)   Osteomyelitis of lumbar spine   Fungal osteomyelitis   VRE (vancomycin resistant enterococcus) culture positive   Pain, hip   Left flank pain   Hip pain   Discitis thoracic region    Samuel Schultz is a 38 y.o. 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 We took him off antibiotics last week and he is now sp L5-S1 microsiscectomy with nerve root discetomy and biopsies mx for culture by Dr. Saintclair Halsted yesteday  #1 Thoracic and Lumbar Diskitis: persistent infection in L spine and new infection in T spine DESPITE MULTIPLE ROUNDS OF IV ANTIBIOTICS INCLUDING ANTIFUNGALS  GREATLY APPRECIATE DR. CRAM'S HELP HERE  And pt is sp Neurosurgery-HOPEFULLY THIS WILL HELP CONTROL HIS INFECTION   I HAVE RESTARTED MICAFUNGIN, AND WE WILL FOLLOWUP CULTURES  I AM VERY SKEPTICAL THAT VRE IS PLAYER HERE AND DONT THINK WE NEED TO GO WITH DAPTOMYCIN AGAIN  HOWEVER I DO THINK WOULD BE PRUDENT TO COVER FOR COAG NEG STPH AND NV STREP WITH VANCOMYCIN. I WILL START VANCOMYCIN ONCE WE HAVE IMAGED HIP (SEE  BELOW) AND ENSURED NO MORE SURGICAL OR BIOPSY TARGETS THERE  Will restart IV vancomycin dosed for target trough 15-20  Would plan 8 weeks of IV vancomycin and IV micafungin then followed by high dsoe fluconazole     #2 Septic hip: sp aspiration of hip for bacterial and fungal cx, cell count if possible --start IV vancomycin --followup cultures  #3 HIV: doing perfectly with suppression and undetectable viral load  Dr. Linus Salmons is available for questions over the weekend.   LOS: 28 days   Alcide Evener 04/20/2014, 3:30 PM

## 2014-04-20 NOTE — Progress Notes (Signed)
ANTIBIOTIC CONSULT NOTE - INITIAL  Pharmacy Consult for Vancomycin Indication: Osteomyelitis   Allergies  Allergen Reactions  . Codeine Anaphylaxis, Hives, Nausea And Vomiting, Nausea Only and Swelling  . Oxycodone Other (See Comments)    Other reaction(s): Dizziness Lightheaded, nausea    Patient Measurements: Height: 5\' 8"  (172.7 cm) Weight: 159 lb 6.3 oz (72.3 kg) IBW/kg (Calculated) : 68.4  Vital Signs: Temp: 98.6 F (37 C) (11/13 1411) Temp Source: Oral (11/13 1411) BP: 121/59 mmHg (11/13 1411) Pulse Rate: 94 (11/13 1411) Intake/Output from previous day: 11/12 0701 - 11/13 0700 In: 700 [P.O.:580; I.V.:120] Out: 450 [Urine:450] Intake/Output from this shift: Total I/O In: 0  Out: 600 [Urine:600]  Labs:  Recent Labs  04/18/14 0530  WBC 2.1*  HGB 8.1*  PLT 97*  CREATININE 0.58   Estimated Creatinine Clearance: 121.1 mL/min (by C-G formula based on Cr of 0.58). No results for input(s): VANCOTROUGH, VANCOPEAK, VANCORANDOM, GENTTROUGH, GENTPEAK, GENTRANDOM, TOBRATROUGH, TOBRAPEAK, TOBRARND, AMIKACINPEAK, AMIKACINTROU, AMIKACIN in the last 72 hours.   Microbiology: Recent Results (from the past 720 hour(s))  Surgical pcr screen     Status: None   Collection Time: 03/24/14  1:51 AM  Result Value Ref Range Status   MRSA, PCR NEGATIVE NEGATIVE Final   Staphylococcus aureus NEGATIVE NEGATIVE Final    Comment:        The Xpert SA Assay (FDA approved for NASAL specimens in patients over 64 years of age), is one component of a comprehensive surveillance program.  Test performance has been validated by [employer] for patients greater than or equal to 10 year old. It is not intended to diagnose infection nor to guide or monitor treatment.  Surgical pcr screen     Status: None   Collection Time: 04/12/14  1:29 AM  Result Value Ref Range Status   MRSA, PCR NEGATIVE NEGATIVE Final   Staphylococcus aureus NEGATIVE NEGATIVE Final    Comment:        The Xpert  SA Assay (FDA approved for NASAL specimens in patients over 3 years of age), is one component of a comprehensive surveillance program.  Test performance has been validated by [employer] for patients greater than or equal to 60 year old. It is not intended to diagnose infection nor to guide or monitor treatment.   Surgical PCR screen     Status: None   Collection Time: 04/16/14  7:45 AM  Result Value Ref Range Status   MRSA, PCR NEGATIVE NEGATIVE Final   Staphylococcus aureus NEGATIVE NEGATIVE Final    Comment:        The Xpert SA Assay (FDA approved for NASAL specimens in patients over 66 years of age), is one component of a comprehensive surveillance program.  Test performance has been validated by [employer] for patients greater than or equal to 62 year old. It is not intended to diagnose infection nor to guide or monitor treatment.   Wound culture     Status: None   Collection Time: 04/16/14  6:42 PM  Result Value Ref Range Status   Specimen Description WOUND BACK  Final   Special Requests NOC L5 S1 DISC SPACE  Final   Gram Stain   Final    NO WBC SEEN NO SQUAMOUS EPITHELIAL CELLS SEEN NO ORGANISMS SEEN Performed at [facility]    Culture   Final    NO GROWTH 2 DAYS Performed at [facility]    Report Status 04/19/2014 FINAL  Final  Wound  culture     Status: None   Collection Time: 04/16/14  6:42 PM  Result Value Ref Range Status   Specimen Description WOUND BACK  Final   Special Requests NO A L5 S1 EPIDURAL SPACE  Final   Gram Stain   Final    NO WBC SEEN NO SQUAMOUS EPITHELIAL CELLS SEEN NO ORGANISMS SEEN Performed at [facility]    Culture   Final    NO GROWTH 2 DAYS Performed at [facility]    Report Status 04/19/2014 FINAL  Final  Wound culture     Status: None   Collection Time: 04/16/14  6:42 PM  Result Value Ref Range Status   Specimen Description WOUND BACK  Final   Special Requests NOB L5 S1  EPIDURAL SPACE  Final   Gram Stain   Final    NO WBC SEEN NO SQUAMOUS EPITHELIAL CELLS SEEN NO ORGANISMS SEEN Performed at [facility]    Culture   Final    NO GROWTH 2 DAYS Performed at [facility]    Report Status 04/19/2014 FINAL  Final  Anaerobic culture     Status: None (Preliminary result)   Collection Time: 04/16/14  6:42 PM  Result Value Ref Range Status   Specimen Description WOUND BACK  Final   Special Requests NO A L5 S1 EPIDURAL SPACE  Final   Gram Stain   Final    NO WBC SEEN NO SQUAMOUS EPITHELIAL CELLS SEEN NO ORGANISMS SEEN Performed at [facility]    Culture   Final    NO ANAEROBES ISOLATED; CULTURE IN PROGRESS FOR 5 DAYS Performed at [facility]    Report Status PENDING  Incomplete  Anaerobic culture     Status: None (Preliminary result)   Collection Time: 04/16/14  6:42 PM  Result Value Ref Range Status   Specimen Description WOUND BACK  Final   Special Requests NO B L5 S1 EPIDURAL SPACE  Final   Gram Stain PENDING  Incomplete   Culture   Final    NO ANAEROBES ISOLATED; CULTURE IN PROGRESS FOR 5 DAYS Performed at [facility]    Report Status PENDING  Incomplete  Anaerobic culture     Status: None (Preliminary result)   Collection Time: 04/16/14  6:42 PM  Result Value Ref Range Status   Specimen Description WOUND BACK  Final   Special Requests NO C L5 S1 DISC SPACE  Final   Gram Stain   Final    NO WBC SEEN NO SQUAMOUS EPITHELIAL CELLS SEEN NO ORGANISMS SEEN Performed at [facility]    Culture   Final    NO ANAEROBES ISOLATED; CULTURE IN PROGRESS FOR 5 DAYS Performed at [facility]    Report Status PENDING  Incomplete  Anaerobic culture     Status: None (Preliminary result)   Collection Time: 04/16/14  6:42 PM  Result Value Ref Range Status   Specimen Description WOUND BACK  Final   Special Requests NO D L5 S1 DISC SPACE  Final   Gram Stain   Final    NO WBC SEEN NO SQUAMOUS  EPITHELIAL CELLS SEEN NO ORGANISMS SEEN Performed at [facility]    Culture   Final    NO ANAEROBES ISOLATED; CULTURE IN PROGRESS FOR 5 DAYS Performed at [facility]    Report Status PENDING  Incomplete  Wound culture     Status: None   Collection Time: 04/16/14  6:43 PM  Result Value Ref Range Status   Specimen Description WOUND BACK  Final   Special Requests NOD L5 S1 DISC SPACE  Final   Gram Stain   Final    RARE WBC PRESENT, PREDOMINANTLY MONONUCLEAR NO SQUAMOUS EPITHELIAL CELLS SEEN NO ORGANISMS SEEN Performed at [facility]    Culture   Final    NO GROWTH 2 DAYS Performed at [facility]    Report Status 04/20/2014 FINAL  Final  Fungus Culture with Smear     Status: None (Preliminary result)   Collection Time: 04/16/14  6:44 PM  Result Value Ref Range Status   Specimen Description WOUND BACK  Final   Special Requests NO A L5 S1 EPIDURAL SPACE  Final   Fungal Smear   Final    NO YEAST OR FUNGAL ELEMENTS SEEN Performed at [facility]    Culture   Final    CULTURE IN PROGRESS FOR FOUR WEEKS Performed at [facility]    Report Status PENDING  Incomplete  Fungus Culture with Smear     Status: None (Preliminary result)   Collection Time: 04/16/14  6:44 PM  Result Value Ref Range Status   Specimen Description WOUND BACK  Final   Special Requests NOB SWABS L5 S1 EPIDURAL SPACE  Final   Fungal Smear   Final    NO YEAST OR FUNGAL ELEMENTS SEEN Performed at [facility]    Culture   Final    CULTURE IN PROGRESS FOR FOUR WEEKS Performed at [facility]    Report Status PENDING  Incomplete  Fungus Culture with Smear     Status: None (Preliminary result)   Collection Time: 04/16/14  6:44 PM  Result Value Ref Range Status   Specimen Description WOUND BACK  Final   Special Requests NOC L5 S1 DISC SPACE  Final   Fungal Smear   Final    NO YEAST OR FUNGAL ELEMENTS SEEN Performed at [facility]    Culture   Final    CULTURE IN PROGRESS FOR FOUR WEEKS Performed at [facility]    Report Status PENDING  Incomplete  Fungus Culture with Smear     Status: None (Preliminary result)   Collection Time: 04/16/14  6:44 PM  Result Value Ref Range Status   Specimen Description WOUND BACK  Final   Special Requests NOD L5 S1 DISC SPACE  Final   Fungal Smear   Final    NO YEAST OR FUNGAL ELEMENTS SEEN Performed at [facility]    Culture   Final    CULTURE IN PROGRESS FOR FOUR WEEKS Performed at [facility]    Report Status PENDING  Incomplete  AFB culture with smear     Status: None (Preliminary result)   Collection Time: 04/16/14  6:44 PM  Result Value Ref Range Status   Specimen Description WOUND BACK  Final   Special Requests NO A L5 S1 EPIDURAL SPACE  Final   Acid Fast Smear   Final    NO ACID FAST BACILLI SEEN Performed at [facility]    Culture   Final    CULTURE WILL BE EXAMINED FOR 6 WEEKS BEFORE ISSUING A FINAL REPORT Performed at [facility]    Report Status PENDING  Incomplete  AFB culture with smear     Status: None (Preliminary result)   Collection Time: 04/16/14  6:44 PM  Result Value Ref Range Status  Specimen Description WOUND BACK  Final   Special Requests NOB EPIDURAL SPACE  Final   Acid Fast Smear   Final    NO ACID FAST BACILLI SEEN Performed at [facility]    Culture   Final    CULTURE WILL BE EXAMINED FOR 6 WEEKS BEFORE ISSUING A FINAL REPORT Performed at [facility]    Report Status PENDING  Incomplete  AFB culture with smear     Status: None (Preliminary result)   Collection Time: 04/16/14  6:44 PM  Result Value Ref Range Status   Specimen Description WOUND BACK  Final   Special Requests NOC L5 S1 DISC SPACE  Final   Acid Fast Smear   Final    NO ACID FAST BACILLI SEEN Performed at [facility]    Culture   Final    CULTURE WILL BE EXAMINED FOR 6 WEEKS BEFORE  ISSUING A FINAL REPORT Performed at [facility]    Report Status PENDING  Incomplete  AFB culture with smear     Status: None (Preliminary result)   Collection Time: 04/16/14  6:44 PM  Result Value Ref Range Status   Specimen Description WOUND BACK  Final   Special Requests NOD L5 S1 DISC SPACE  Final   Acid Fast Smear   Final    NO ACID FAST BACILLI SEEN Performed at [facility]    Culture   Final    CULTURE WILL BE EXAMINED FOR 6 WEEKS BEFORE ISSUING A FINAL REPORT Performed at [facility]    Report Status PENDING  Incomplete    Medical History: Past Medical History  Diagnosis Date  . HIV disease   . Cirrhosis   . PTSD (post-traumatic stress disorder)   . Depressive disorder   . Cancer   . Lymphoma   . Anemia     Medications:  Prescriptions prior to admission  Medication Sig Dispense Refill Last Dose  . atovaquone (MEPRON) 750 MG/5ML suspension Take 750 mg by mouth daily.   03/23/2014 at Unknown time  . buPROPion (WELLBUTRIN XL) 300 MG 24 hr tablet Take 300 mg by mouth daily.   03/23/2014 at 0900  . ceFEPIme 2 g in dextrose 5 % 50 mL Inject 2 g into the vein every 12 (twelve) hours. 1 ampule 0 03/23/2014 at 0900  . cyclobenzaprine (FLEXERIL) 5 MG tablet Take 1 tablet (5 mg total) by mouth at bedtime. 10 tablet 0 03/22/2014 at Unknown time  . Darunavir Ethanolate (PREZISTA) 800 MG tablet Take 1 tablet (800 mg total) by mouth daily. 30 tablet 2 03/23/2014 at Unknown time  . emtricitabine-tenofovir (TRUVADA) 200-300 MG per tablet Take 1 tablet by mouth at bedtime. 30 tablet 2 03/22/2014 at Unknown time  . ferrous sulfate 325 (65 FE) MG tablet Take 1 tablet (325 mg total) by mouth 2 (two) times daily with a meal. 60 tablet 3 03/23/2014 at Unknown time  . fluconazole (DIFLUCAN) 200 MG tablet Take 2 tablets (400 mg total) by mouth daily. 30 tablet 1 03/23/2014 at Unknown time  . gabapentin (NEURONTIN) 400 MG capsule Take 400 mg by mouth 3 (three)  times daily.   03/23/2014 at Unknown time  . omeprazole (PRILOSEC) 20 MG capsule Take 20 mg by mouth 2 (two) times daily.   03/23/2014 at Unknown time  . potassium chloride SA (K-DUR,KLOR-CON) 20 MEQ tablet Take 20 mEq by mouth daily.   03/23/2014 at Unknown time  . promethazine (PHENERGAN) 12.5 MG tablet  Take 1 tablet (12.5 mg total) by mouth every 6 (six) hours as needed for nausea or vomiting. 30 tablet 0 03/23/2014 at Unknown time  . ritonavir (NORVIR) 100 MG capsule Take 1 capsule (100 mg total) by mouth daily with breakfast. With Prezista 30 capsule 2 03/23/2014 at Unknown time  . sucralfate (CARAFATE) 1 G tablet Take 1 g by mouth 3 (three) times daily with meals.    03/23/2014 at Unknown time  . temazepam (RESTORIL) 7.5 MG capsule Take 1 capsule (7.5 mg total) by mouth at bedtime as needed for sleep. 30 capsule 5 03/22/2014 at Unknown time  . [DISCONTINUED] clonazePAM (KLONOPIN) 1 MG tablet Take 1 mg by mouth at bedtime.   03/22/2014 at Unknown time  . azithromycin (ZITHROMAX) 600 MG tablet Take 1,200 mg by mouth once a week. saturday   03/17/2014  . methocarbamol (ROBAXIN) 500 MG tablet Take 1 tablet (500 mg total) by mouth every 6 (six) hours as needed for muscle spasms. 20 tablet 0 unknown  . [DISCONTINUED] clonazePAM (KLONOPIN) 0.5 MG tablet Take one tablet by mouth twice daily as needed for anxiety 60 tablet 5 unknown  . [DISCONTINUED] morphine (MS CONTIN) 60 MG 12 hr tablet Take 1 tablet (60 mg total) by mouth every 12 (twelve) hours. 60 tablet 0 unknown  . [DISCONTINUED] morphine (MSIR) 15 MG tablet Take 1 tablet (15 mg total) by mouth every 4 (four) hours as needed for severe pain. 180 tablet 0 unknown   Assessment: 76 YOM who is HIV positive with septic hip despite multiple rounds of IV antibiotics. He was restarted on micafungin and pharmacy consulted to dose empiric vancomycin to cover for coag negative staph and NV strep. WBC was low and pt is afebrile. CrCl > 100 mLmin.   New  Cultures: 11/13: Synovial fluid (R hip)>> 11/13: Fungus Cx >>    Goal of Therapy:  Vancomycin trough level 15-20 mcg/ml  Plan:  -Vancomycin 1500 mg IV load followed by 1000 mg IV Q 8 hours -Monitor CBC, renal fx, cultures and patient's clinical progress -[facility] at [facility], PharmD.  Clinical Pharmacist Pager [phone]

## 2014-04-20 NOTE — Progress Notes (Signed)
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 He was admitted to [facility], from [facility] rehabilitation facility.  He was recently in the hospital for treatment of L3-L4 discitis and abscess at the L5-S1 level. He was discharged on cefepime, daptomycin and fluconazole. In the nursing facility patient complained about worsening back pain, brought back to the ED and repeat MRI of the back showed worsening of L3-4 discitis and new discitis at the level of T10-11. He was admitted to the hospital for further evaluation, started on daptomycin and micafungin per ID. On 11/3 ID stopped his antbiotic and antfungal therapy and requested that Neurosurgery re-evaluate the patient and attempt to obtain deep cultures in order to target treatment.  Left-sided L5-S1 laminectomy and microdiscectomy originally planned for 04/12/2014, but delayed due to elevated INR. Patient eventually had surgery on 11/9 and his antibiotics were restarted on 11/10.  An MRI of the right hip showed severe arthritis 11/12.  IR aspirated fluid from the hip on 11/13 to rule out septic arthritis.  Our current plan is for Samuel Schultz to d/c to SNF on Monday after initial results have returned on the fluid cultures from his hip.  Subjective Patient frustrated, anxious today over hip aspiration.  He was re-assured when he was told that Samuel Schultz would continue to hold his room until Monday.  Assessment/Plan: Acute discitis:  Recent h/o L3-4 discitis and L5-S1 abscess. Came in with new discitis at T10-11, worsening L5-S1 abscess, and persistent L3-4 discitis.  Seen by ID here and placed on Micafungin and daptomycin 8 mg/kg. These were subsequently discontinued on 11/3 in preparation for deep biopsy.    Status post Left-sided L5-S1 laminectomy and  microdiscectomy  on 11/9.   Following the surgery, infectious disease restarted micafungin. Currently operative wound cultures, fungal cultures, and AFB show no growth to date.  MRI bilateral hip completed. Results are concerning for right hip septic arthritis.  IR will draw fluid from the joint space and send for cultures.  Patient ambulating with PT and walker.  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  Suspect releated to narcotics and constipation. Now having BM's on senna.  Tolerating soft diet well.   Back Pain:   Secondary to diskitis with a component of anxiety.   On Klonopin QHS. Pain is overall better 6/10. Back brace that was added for support is helping him become more mobile.   Pain/Anxiety management regimen: Continue MS Contin 60 mg bid, MSIR 15 mg prn. Has Xanax 0.25 mg bid prn, klonopin qhs.   Will add limited IV dilaudid for 24 hours after hip aspiration.  Patient is not pain seeking.  AIDS/HIV infection:   Absolute CD4 count is 40 on 01/27/14.   Viral load is undetectable.  Patient is on Truvada, Prezista and Norvir. Azithromycin for prophylaxis.   Infectious Disease is on board appreciate their assistance and recommendations.  Cirrhosis:   Reportedly secondary to previous lymphoma infiltration to the liver and R-CHOP chemotherapy.   Radiological evidence of cirrhosis and portal hypertension, with massive splenomegaly and esophageal varices.  With evidence of decompensation given coagulopathy  Pressure ulceration at sacrum  Air Overlay mattress in place.  Foam dressing.  Improved.  Appreciate wound care consult  Constipation:   Resolved.  On bowel regimen  with Senna and Miralax.  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  Labs show stable leukopenia, anemia and thrombocytopenia.  Will continue to monitor as needed while patient is in hospital.  Severe protein calorie malnutrition: Continue with diet supplements after discharge   History large B-cell lymphoma 2009:   Metastasis to the back. Was treated with chemotherapy in 2009.   Bone marrow biopsy done on 11/02/2013 in the [facility], showed no evidence of recurrent disease.  DVT Prophylaxis:   SCDs  Code Status: Full Family Communication: discussed with patient  Disposition Plan: SNF when appropriate.   Consultants:  ID  Neurosurgery.  Interventional Radiology  Procedures:  None  Antibiotics:  Micafungin 10/17>> 04/10/2014  Daptomycin continued since discharge last time >> 04/10/2014  Micafungin 04/17/2014 >>  Vancomycin 04/19/2014 >>  Objective: Filed Vitals:   04/18/14 2058 04/19/14 0537 04/19/14 2218 04/20/14 0544  BP: 119/51 106/58 107/58 114/57  Pulse: 91 87 90 86  Temp: 98 F (36.7 C) 98.5 F (36.9 C) 98.8 F (37.1 C) 98.6 F (37 C)  TempSrc: Oral Oral Oral Oral  Resp: 18 20 16 18   Height:      Weight:    72.3 kg (159 lb 6.3 oz)  SpO2: 98% 98% 97% 95%    Intake/Output Summary (Last 24 hours) at 04/20/14 1315 Last data filed at 04/20/14 1005  Gross per 24 hour  Intake    360 ml  Output    450 ml  Net    -90 ml   Filed Weights   04/17/14 0636 04/18/14 0500 04/20/14 0544  Weight: 68.13 kg (150 lb 3.2 oz) 68.04 kg (150 lb) 72.3 kg (159 lb 6.3 oz)    Exam: General:   A&O, comfortable lying in bed.  Distressed over ++ MRI finding.  But still appropriate. HEENT: no scleral icterus, mouth dry,  Neck: Supple, no JVD, no masses  Cardiovascular: RRR, S1 S2 auscultated, no rubs, murmurs or gallops.   Respiratory: Clear to auscultation bilaterally  with equal chest rise Abdomen: Soft, nontender, nondistended, + bowel sounds Extremities: no edema   Data Reviewed: Basic Metabolic Panel:  Recent Labs Lab 04/15/14 0508 04/16/14 0522 04/17/14 0459 04/18/14 0530  NA 135* 135* 134* 133*  K 3.6* 3.4* 3.9 3.5*  CL 104 103 102 101  CO2 24 24 21 21   GLUCOSE 79 90 63* 75  BUN 7 7 5* 7  CREATININE 0.71 0.61 0.50 0.58  CALCIUM 8.4 8.3* 8.8 8.6   CBC:  Recent Labs Lab 04/15/14 0508 04/16/14 0522 04/17/14 0459 04/18/14 0530  WBC 2.8* 2.0* 1.9* 2.1*  HGB 7.9* 7.3* 7.2* 8.1*  HCT 23.8* 21.7* 21.7* 24.3*  MCV 95.2 95.6 94.8 96.0  PLT 93* 97* 90* 97*   Cardiac Enzymes:  Recent Labs Lab 04/15/14 0508  CKTOTAL 169    Studies: Mr Hip Right W Wo Contrast  04/19/2014   CLINICAL DATA:  RIGHT hip pain. HIV. History of lymphoma. Coagulopathy.  EXAM: MRI OF THE BILATERAL HIPS WITHOUT AND WITH CONTRAST  TECHNIQUE: Multiplanar, multisequence MR imaging was performed both before and after administration of intravenous contrast.  CONTRAST:  87m MULTIHANCE GADOBENATE DIMEGLUMINE 529 MG/ML IV SOLN  COMPARISON:  04/11/2014 radiographs.  MRI 04/23/2014. CT 03/23/2014.  FINDINGS: Severe arthritis of the RIGHT hip. Small effusion is present with diffuse synovitis. The joint capsule enhances diffusely and there is bone marrow edema in the acetabulum and femoral head. The LEFT hip appears within normal limits. Nonspecific edema is present along both iliac wings.  There is a benign-appearing lesion in the supra-acetabular LEFT ilium extending to the posterior column measuring 24 mm x 14 mm. Comparing to CT scans dating back to 10/16/2013, this is compatible with a benign chondroid lesion. This shows low level enhancement after contrast administration. Similar lesion is present in the RIGHT medial iliac bone. 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  There is mild muscular edema around the RIGHT hip which  is probably reactive. No soft tissue abscess. Muscular edema is also present in the LEFT proximal rectus femoris muscle. The cause for this is unclear and could represent strain. Infectious myositis is a consideration. Common hamstring origins appear normal. The pelvic rings are intact. Urinary bladder and visceral pelvis grossly within normal limits.  L5-S1 osteomyelitis/discitis noted. Similar but much less pronounced changes are present at L3-L4.  IMPRESSION: 1. RIGHT hip arthritis with mild narrowing of the joint space and severe synovitis with only a small amount of joint effusion. This is most concerning for septic arthritis in a patient with discitis/osteomyelitis and HIV. Inflammatory arthritis is in the differential considerations. 2. Critical Value/emergent results were called by telephone at the time of interpretation on 04/19/2014 at 4:36 pm to Dr. Rhina Brackett DAM , who verbally acknowledged these results. We discussed the differential considerations and given the patient's other sites of infection, this is most likely an atypical presentation of septic arthritis. Although aspiration may not yield because of the scant amount of joint fluid, it could be useful. 3. Normal appearance of the LEFT hip.   Electronically Signed   By: Dereck Ligas M.D.   On: 04/19/2014 16:43   Mr Hip Left W Wo Contrast  04/19/2014   CLINICAL DATA:  RIGHT hip pain. HIV. History of lymphoma. Coagulopathy.  EXAM: MRI OF THE BILATERAL HIPS WITHOUT AND WITH CONTRAST  TECHNIQUE: Multiplanar, multisequence MR imaging was performed both before and after administration of intravenous contrast.  CONTRAST:  32m MULTIHANCE GADOBENATE DIMEGLUMINE 529 MG/ML IV SOLN  COMPARISON:  04/11/2014 radiographs. MRI 04/23/2014. CT 03/23/2014.  FINDINGS: Severe arthritis of the RIGHT hip. Small effusion is present with diffuse synovitis. The joint capsule enhances diffusely and there is bone marrow edema in the acetabulum and femoral head. The  LEFT hip appears within normal limits. Nonspecific edema is present along both iliac wings.  There is a benign-appearing lesion in the supra-acetabular LEFT ilium extending to the posterior column measuring 24 mm x 14 mm. Comparing to CT scans dating back to 10/16/2013, this is compatible with a benign chondroid lesion. This shows low level enhancement after contrast administration. Similar lesion is present in the RIGHT medial iliac bone. 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  There is mild muscular edema around the RIGHT hip which is probably reactive. No soft tissue abscess. Muscular edema is also present in the LEFT proximal rectus femoris muscle. The cause for this is unclear and could represent strain. Infectious myositis is a consideration. Common hamstring origins appear normal. The pelvic rings are intact. Urinary bladder and visceral pelvis grossly within normal limits.  L5-S1 osteomyelitis/discitis noted. Similar but much less pronounced changes are present at L3-L4.  IMPRESSION: 1. RIGHT hip arthritis with mild narrowing of the joint space and severe synovitis with only a small amount of joint effusion. This is most concerning for septic arthritis in a patient with discitis/osteomyelitis and HIV. Inflammatory arthritis is in the differential considerations. 2. Critical Value/emergent results were called by telephone at the time of interpretation on 04/19/2014 at 4:36 pm to Dr. Rhina Brackett DAM , who verbally acknowledged these results. We discussed the differential considerations and given the patient's other sites of infection, this is most likely an atypical presentation of septic arthritis. Although aspiration may not yield because of the scant amount of joint fluid, it could be useful. 3. Normal appearance of the LEFT hip.   Electronically Signed   By: Dereck Ligas M.D.   On: 04/19/2014 16:43    Scheduled Meds: . [START ON 04/21/2014]  atovaquone  1,500 mg Oral Daily  . buPROPion  300 mg Oral Daily  . clonazePAM  1 mg Oral QHS  . Darunavir Ethanolate  800 mg Oral Q breakfast  . docusate sodium  100 mg Oral BID  . emtricitabine-tenofovir  1 tablet Oral QHS  . feeding supplement (ENSURE COMPLETE)  237 mL Oral BID BM  . feeding supplement (ENSURE)  1 Container Oral TID BM  . gabapentin  400 mg Oral TID  . micafungin [facility]) IV  150 mg Intravenous Daily  . morphine  60 mg Oral Q12H  . polyethylene glycol  17 g Oral Daily  . ritonavir  100 mg Oral Q breakfast  . senna-docusate  2 tablet Oral Daily  . sucralfate  1 g Oral TID WC   Continuous Infusions: . sodium chloride 10 mL/hr at 04/16/14 2235    Principal Problem:   Diskitis Active Problems:   AIDS   Cirrhosis, non-alcoholic   Pancytopenia   Protein-calorie malnutrition, severe   Discitis   Abscess in epidural space of lumbar spine   Infectious discitis   Discitis of lumbar region   HIV (human immunodeficiency virus infection)   Osteomyelitis of lumbar spine   Fungal osteomyelitis   VRE (vancomycin resistant enterococcus) culture positive   Pain, hip   Left flank pain   Hip pain   Discitis thoracic region  Time spent: 25 minutes  Ruthine Dose [facility] [phone]  If 7PM-7AM, please contact night-coverage at [email or web address], password [facility] 04/20/2014, 1:15 PM  LOS: 28 days

## 2014-04-21 LAB — ANAEROBIC CULTURE
GRAM STAIN: NONE SEEN
GRAM STAIN: NONE SEEN
Gram Stain: NONE SEEN
Gram Stain: NONE SEEN

## 2014-04-21 LAB — BASIC METABOLIC PANEL
Anion gap: 11 (ref 5–15)
BUN: 5 mg/dL — ABNORMAL LOW (ref 6–23)
CO2: 20 mEq/L (ref 19–32)
Calcium: 8.1 mg/dL — ABNORMAL LOW (ref 8.4–10.5)
Chloride: 102 mEq/L (ref 96–112)
Creatinine, Ser: 0.46 mg/dL — ABNORMAL LOW (ref 0.50–1.35)
Glucose, Bld: 96 mg/dL (ref 70–99)
POTASSIUM: 3.8 meq/L (ref 3.7–5.3)
SODIUM: 133 meq/L — AB (ref 137–147)

## 2014-04-21 LAB — CBC
HCT: 21.8 % — ABNORMAL LOW (ref 39.0–52.0)
Hemoglobin: 7.3 g/dL — ABNORMAL LOW (ref 13.0–17.0)
MCH: 32.6 pg (ref 26.0–34.0)
MCHC: 33.5 g/dL (ref 30.0–36.0)
MCV: 97.3 fL (ref 78.0–100.0)
Platelets: 79 10*3/uL — ABNORMAL LOW (ref 150–400)
RBC: 2.24 MIL/uL — ABNORMAL LOW (ref 4.22–5.81)
RDW: 17.1 % — AB (ref 11.5–15.5)
WBC: 1.5 10*3/uL — ABNORMAL LOW (ref 4.0–10.5)

## 2014-04-21 NOTE — Progress Notes (Signed)
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 He was admitted to [facility], from [facility] rehabilitation facility. He was recently in the hospital for treatment of L3-L4 discitis and abscess at the L5-S1 level. He was discharged on cefepime, daptomycin and fluconazole. In the nursing facility patient complained about worsening back pain, brought back to the ED and repeat MRI of the back showed worsening of L3-4 discitis and new discitis at the level of T10-11. He was admitted to the hospital for further evaluation, started on daptomycin and micafungin per ID. On 11/3 ID stopped his antbiotic and antfungal therapy and requested that Neurosurgery re-evaluate the patient and attempt to obtain deep cultures in order to target treatment. Left-sided L5-S1 laminectomy and microdiscectomy originally planned for 04/12/2014, but delayed due to elevated INR. Patient eventually had surgery on 11/9 and his antibiotics were restarted on 11/10. An MRI of the right hip showed severe arthritis 11/12. IR aspirated fluid from the hip on 11/13 to rule out septic arthritis. Our current plan is for Samuel Schultz to d/c to SNF on Monday after initial results have returned on the fluid cultures from his hip.  Subjective Patient is in better spirits today. Denies any fevers, chills, chest pain, shortness of breath, nausea, vomiting, diarrhea. He states that his pain is controlled. He is having bowel movements without diarrhea.  Assessment/Plan: Acute discitis:  Recent h/o L3-4 discitis and L5-S1 abscess. Came in with new discitis at T10-11, worsening L5-S1 abscess, and persistent L3-4 discitis. Seen by ID here and placed on Micafungin and daptomycin 8 mg/kg. These were subsequently discontinued on 11/3 in  preparation for deep biopsy.   Status post Left-sided L5-S1 laminectomy and microdiscectomy on 11/9. Following the surgery, infectious disease restarted micafungin. Currently operative wound cultures, fungal cultures, and AFB show no growth to date.  MRI bilateral hip completed. Results are concerning for right hip septic arthritis. (04/20/14) IR arthrocentesis fluid from the joint space and sent for cultures.  Patient ambulating with PT and walker.  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  Suspect releated to narcotics and constipation. Now having BM's on senna.  Tolerating soft diet well.  Back Pain:   Secondary to diskitis with a component of anxiety.   On Klonopin QHS. Pain is overall better 6/10. Back brace that was added for support is helping him become more mobile.   Pain/Anxiety management regimen: Continue MS Contin 60 mg bid, MSIR 15 mg prn. Has Xanax 0.25 mg bid prn, klonopin qhs. Will add limited IV dilaudid for 24 hours after hip aspiration. Patient is not pain seeking.  AIDS/HIV infection:   Absolute CD4 count is 40 on 01/27/14. Viral load is undetectable.  Patient is on Truvada, Prezista and Norvir. Azithromycin for MAI prophylaxis.   Infectious Disease is on board appreciate their assistance and recommendations.  Atovaquone for PCP prophylaxis  Cirrhosis:  Reportedly secondary to previous lymphoma infiltration to the liver and R-CHOP chemotherapy.   Radiological evidence of cirrhosis and portal hypertension, with  massive splenomegaly and esophageal varices.   With evidence of decompensation given coagulopathy  Pressure ulceration at sacrum  Air Overlay mattress in place. Foam dressing.  Improved.  Appreciate wound care consult  Constipation:   Resolved.  On bowel regimen with Senna and Miralax.  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  Labs show stable leukopenia, anemia and thrombocytopenia.  Will continue to monitor as needed while patient is in hospital.  Severe protein calorie malnutrition: Continue with diet supplements after discharge  -patient intermittently refuses  History large B-cell lymphoma 2009:   Metastasis to the back. Was treated with chemotherapy in 2009.   Bone marrow biopsy done on 11/02/2013 in the [facility], showed no evidence of recurrent disease.  DVT Prophylaxis: SCDs  Code Status: Full Family Communication: discussed with patient  Disposition Plan: SNF when appropriate.   Consultants:  ID  Neurosurgery.  Interventional Radiology  Procedures:  None  Antibiotics:  Micafungin 10/17>> 04/10/2014  Daptomycin continued since discharge last time >> 04/10/2014  Micafungin 04/17/2014 >>  Vancomycin 04/19/2014 >>  Family Communication:   Pt alert and oriented Disposition Plan:   [facility] on 04/23/14 if stable      Procedures/Studies: Dg Lumbar Spine 2-3 Views  04/16/2014   CLINICAL DATA:  L5-S1 laminectomy.  EXAM: LUMBAR SPINE - 2-3 VIEW  COMPARISON:  03/23/2014.  FINDINGS: Series of 2 intraoperative lateral radiographs for localization purposes demonstrate exposure and a probe over the dorsal aspect of the L5-S1 interspinous space.  IMPRESSION: Intraoperative localization at L5-S1.   Electronically Signed   By: Dereck Ligas M.D.   On: 04/16/2014 21:11   Dg Hip Complete Left  04/11/2014   CLINICAL DATA:  Patient felt pop in left  hip with left hip pain. History of spinal infection.  EXAM: LEFT HIP - COMPLETE 2+ VIEW  COMPARISON:  CT 03/23/2014 and plain film 11/12/2013  FINDINGS: There are minimal degenerative changes of the right hip. There is no evidence of osteolysis. There is no fracture or dislocation. Mild degenerative change of the lumbar spine.  IMPRESSION: No acute findings.  Mild degenerative change of the right hip.   Electronically Signed   By: Marin Olp M.D.   On: 04/11/2014 07:59   Mr Lumbar Spine Wo Contrast  03/23/2014   CLINICAL DATA:  Multilevel infectious discitis. HIV infection. Lymphoma. Left flank pain of 2 days duration.  EXAM: MRI LUMBAR SPINE WITHOUT CONTRAST  TECHNIQUE: Multiplanar, multisequence MR imaging of the lumbar spine was performed. No intravenous contrast was administered.  COMPARISON:  CT scan same day.  Previous MRI 01/26/2014  FINDINGS: The scan covers the region from lower T9 through S3.  T9-10 does not appear involved.  T10-11: Discitis osteomyelitis at this level with fluid intensity material in the disc space and marrow edema throughout the T10 and T11 vertebral bodies. Slight loss of height at the endplates. No evidence of epidural abscess. No cord compression.  T11-12: No evidence of involvement at this level.  T12-L1 L1-2:  Normal.  L2-3: New abnormality on the left with a superior endplate Schmorl's node type pattern at L3. This could be evidence of early disc space infection at this level or this could be an actual an related Schmorl's node.  L3-4: Persistent evidence of discitis osteomyelitis at this level with  edema in the disc and vertebral bodies but no evidence of progression. No spinal abscess.  L4-5:  No evidence of involvement of this disc space.  L5-S1: Worsened pattern with fluid throughout the disc space and worsened edema within the L5 and S1 vertebral bodies. There appears to be either a left posterior lateral disc herniation with caudal migration or some epidural abscess  on the left behind S1. The thecal sac is not compressed. There would be potential to affect the S1 nerve roots.  IMPRESSION: Newly seen discitis osteomyelitis at T10-11. Fluid in the disc space. Early collapse of the endplates. No spinal abscess or neural compression.  Newly seen abnormality on the left at L2-3 which looks like a superior endplate Schmorl's node but could be evidence of early infection developing at the L2-3 disc level.  Persistent osteomyelitis discitis at L3-4, similar to the previous study. No abscess or neural compression.  Marked worsening of the appearance at L5-S1 with fluid throughout the disc space, edema within the L5 and S1 vertebral bodies, an abnormal material in the ventral epidural space that could be extruded disc material or ventral epidural abscess. The central canal is not compressed but there would be potential to affect the S1 nerve roots.   Electronically Signed   By: Nelson Chimes M.D.   On: 03/23/2014 20:21   Mr Hip Right W Wo Contrast  04/19/2014   CLINICAL DATA:  RIGHT hip pain. HIV. History of lymphoma. Coagulopathy.  EXAM: MRI OF THE BILATERAL HIPS WITHOUT AND WITH CONTRAST  TECHNIQUE: Multiplanar, multisequence MR imaging was performed both before and after administration of intravenous contrast.  CONTRAST:  59m MULTIHANCE GADOBENATE DIMEGLUMINE 529 MG/ML IV SOLN  COMPARISON:  04/11/2014 radiographs. MRI 04/23/2014. CT 03/23/2014.  FINDINGS: Severe arthritis of the RIGHT hip. Small effusion is present with diffuse synovitis. The joint capsule enhances diffusely and there is bone marrow edema in the acetabulum and femoral head. The LEFT hip appears within normal limits. Nonspecific edema is present along both iliac wings.  There is a benign-appearing lesion in the supra-acetabular LEFT ilium extending to the posterior column measuring 24 mm x 14 mm. Comparing to CT scans dating back to 10/16/2013, this is compatible with a benign chondroid lesion. This shows low level  enhancement after contrast administration. Similar lesion is present in the RIGHT medial iliac bone. 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  There is mild muscular edema around the RIGHT hip which is probably reactive. No soft tissue abscess. Muscular edema is also present in the LEFT proximal rectus femoris muscle. The cause for this is unclear and could represent strain. Infectious myositis is a consideration. Common hamstring origins appear normal. The pelvic rings are intact. Urinary bladder and visceral pelvis grossly within normal limits.  L5-S1 osteomyelitis/discitis noted. Similar but much less pronounced changes are present at L3-L4.  IMPRESSION: 1. RIGHT hip arthritis with mild narrowing of the joint space and severe synovitis with only a small amount of joint effusion. This is most concerning for septic arthritis in a patient with discitis/osteomyelitis and HIV. Inflammatory arthritis is in the differential considerations. 2. Critical Value/emergent results were called by telephone at the time of interpretation on 04/19/2014 at 4:36 pm to Dr. CRhina BrackettDAM , who verbally acknowledged these results. We discussed the differential considerations and given the patient's other sites of infection, this is most likely an atypical presentation of septic arthritis. Although aspiration may not yield because of the scant amount  of joint fluid, it could be useful. 3. Normal appearance of the LEFT hip.   Electronically Signed   By: Dereck Ligas M.D.   On: 04/19/2014 16:43   Mr Hip Left W Wo Contrast  04/19/2014   CLINICAL DATA:  RIGHT hip pain. HIV. History of lymphoma. Coagulopathy.  EXAM: MRI OF THE BILATERAL HIPS WITHOUT AND WITH CONTRAST  TECHNIQUE: Multiplanar, multisequence MR imaging was performed both before and after administration of intravenous contrast.  CONTRAST:  31m MULTIHANCE GADOBENATE DIMEGLUMINE 529 MG/ML IV SOLN  COMPARISON:   04/11/2014 radiographs. MRI 04/23/2014. CT 03/23/2014.  FINDINGS: Severe arthritis of the RIGHT hip. Small effusion is present with diffuse synovitis. The joint capsule enhances diffusely and there is bone marrow edema in the acetabulum and femoral head. The LEFT hip appears within normal limits. Nonspecific edema is present along both iliac wings.  There is a benign-appearing lesion in the supra-acetabular LEFT ilium extending to the posterior column measuring 24 mm x 14 mm. Comparing to CT scans dating back to 10/16/2013, this is compatible with a benign chondroid lesion. This shows low level enhancement after contrast administration. Similar lesion is present in the RIGHT medial iliac bone. 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  There is mild muscular edema around the RIGHT hip which is probably reactive. No soft tissue abscess. Muscular edema is also present in the LEFT proximal rectus femoris muscle. The cause for this is unclear and could represent strain. Infectious myositis is a consideration. Common hamstring origins appear normal. The pelvic rings are intact. Urinary bladder and visceral pelvis grossly within normal limits.  L5-S1 osteomyelitis/discitis noted. Similar but much less pronounced changes are present at L3-L4.  IMPRESSION: 1. RIGHT hip arthritis with mild narrowing of the joint space and severe synovitis with only a small amount of joint effusion. This is most concerning for septic arthritis in a patient with discitis/osteomyelitis and HIV. Inflammatory arthritis is in the differential considerations. 2. Critical Value/emergent results were called by telephone at the time of interpretation on 04/19/2014 at 4:36 pm to Dr. CRhina BrackettDAM , who verbally acknowledged these results. We discussed the differential considerations and given the patient's other sites of infection, this is most likely an atypical presentation of septic arthritis.  Although aspiration may not yield because of the scant amount of joint fluid, it could be useful. 3. Normal appearance of the LEFT hip.   Electronically Signed   By: GDereck LigasM.D.   On: 04/19/2014 16:43   Dg Fluoro Guide Ndl Plc/bx  04/20/2014   CLINICAL DATA:  RIGHT hip arthritis. Septic arthritis. Patient with HIV and discitis/osteomyelitis.  EXAM: FLUOROSCOPICALLY GUIDED RIGHT hip ASPIRATION.  FLUOROSCOPY TIME:  0 min 19 seconds  PROCEDURE: After a thorough discussion of risks and benefits of the procedure including the general risks of bleeding, infection, injury to nerves, blood vessels, adjacent structures, and allergic reaction, verbal and written consent was obtained. Verbal consent was obtained by Dr. LGerilyn Pilgrim Specific risks of this procedure included false positive and false negative results and introduction of infection into the joint. Time-out form was completed when appropriate.  The joint was localized under fluoroscopy. A skin entry site anterior to the joint was chosen and marked. The skin was prepped and draped in the usual sterile fashion with Betadine soap. Local anesthesia and deep anesthesia was provided with 1% lidocaine without epinephrine. Careful attention was paid not to inject bacteriostatic lidocaine into the joint. Under fluoroscopic guidance 3.5  inch 20 gauge spinal needle was advanced into the joint. Vigorous aspiration was performed which yielded nothing initially.  Subsequently, injection of 1 ml of omnipaque 180 contrast agent into the joint showed opacification intra-articular placement. This was subsequently aspirated. 8 mL of sterile saline was injected into the joint. This was after intra-articular needle position was confirmed. Subsequently, this was aspirated yielding 2 mL of clear blood-tinged fluid. The needle was removed and a sterile dressing applied.  IMPRESSION: 1. Successful fluoroscopically guided RIGHT hip aspiration. 2. Dry tap. Saline lavage was performed  after confirming needle position with contrast injection. 3. 2 mL aspirated after saline lavage and sent to laboratory for analysis.   Electronically Signed   By: Dereck Ligas M.D.   On: 04/20/2014 14:27   Ct Renal Stone Study  03/23/2014   CLINICAL DATA:  Left flank pain.  HIV, cirrhosis, lymphoma  EXAM: CT ABDOMEN AND PELVIS WITHOUT CONTRAST  TECHNIQUE: Multidetector CT imaging of the abdomen and pelvis was performed following the standard protocol without IV contrast.  COMPARISON:  CT 11/10/2013  FINDINGS: On today's study, neither oral nor IV contrast was administered limiting evaluation of the bowel.  Mild right lower lobe atelectasis with elevated right hemidiaphragm. Left lung base is clear.  Advanced cirrhotic liver. Prior cholecystectomy. Marked splenomegaly unchanged. Spleen measures 17 x 14 x 18 cm. Periportal adenopathy seen on the prior study difficult to evaluate on today's study due to lack of oral and IV contrast. Probably no change.  The kidneys are normal without obstruction or mass. No renal calculi. Urinary bladder normal.  Severe constipation with large amount of stool in the colon. No bowel obstruction. Previously noted thickening of the right colon not identified on today's study. This may have been infectious colitis which has resolved  No free fluid.  No pathologic adenopathy.  Progressive discitis at T10-11 with further bone loss and kyphosis. Progressive discitis and osteomyelitis at L3-4, and L5-S1 with sclerotic changes and endplate erosion. Epidural abscess not evaluated on unenhanced CT.  IMPRESSION: No renal calculi.  Advanced cirrhosis. Marked splenomegaly is unchanged. No free fluid.  Marked constipation.  Progressive discitis and osteomyelitis at T10-11, L3-4, L5-S1.   Electronically Signed   By: Franchot Gallo M.D.   On: 03/23/2014 15:34         Subjective: Patient states the pain is fairly well controlled. Denies any fevers, chills, chest pain, short of breath,  nausea, vomiting, diarrhea  Objective: Filed Vitals:   04/20/14 2125 04/21/14 0500 04/21/14 0509 04/21/14 1419  BP: 110/57  111/62 116/54  Pulse: 90  90 90  Temp: 99.2 F (37.3 C)  98.8 F (37.1 C) 98.7 F (37.1 C)  TempSrc: Oral  Oral Oral  Resp:   18 15  Height:      Weight:  72.3 kg (159 lb 6.3 oz)    SpO2: 97%  97% 99%    Intake/Output Summary (Last 24 hours) at 04/21/14 1701 Last data filed at 04/21/14 1417  Gross per 24 hour  Intake    880 ml  Output    600 ml  Net    280 ml   Weight change: 0 kg (0 lb) Exam:   General:  Pt is alert, follows commands appropriately, not in acute distress  HEENT: No icterus, No thrush,  [place]/AT  Cardiovascular: RRR, S1/S2, no rubs, no gallops  Respiratory: CTA bilaterally, no wheezing, no crackles, no rhonchi  Abdomen: Soft/+BS, non tender, non distended, no guarding  Extremities: trace edema,  No lymphangitis, No petechiae, No rashes, no synovitis  Data Reviewed: Basic Metabolic Panel:  Recent Labs Lab 04/15/14 0508 04/16/14 0522 04/17/14 0459 04/18/14 0530 04/21/14 0609  NA 135* 135* 134* 133* 133*  K 3.6* 3.4* 3.9 3.5* 3.8  CL 104 103 102 101 102  CO2 24 24 21 21 20   GLUCOSE 79 90 63* 75 96  BUN 7 7 5* 7 5*  CREATININE 0.71 0.61 0.50 0.58 0.46*  CALCIUM 8.4 8.3* 8.8 8.6 8.1*   Liver Function Tests: No results for input(s): AST, ALT, ALKPHOS, BILITOT, PROT, ALBUMIN in the last 168 hours. No results for input(s): LIPASE, AMYLASE in the last 168 hours. No results for input(s): AMMONIA in the last 168 hours. CBC:  Recent Labs Lab 04/15/14 0508 04/16/14 0522 04/17/14 0459 04/18/14 0530 04/21/14 0609  WBC 2.8* 2.0* 1.9* 2.1* 1.5*  HGB 7.9* 7.3* 7.2* 8.1* 7.3*  HCT 23.8* 21.7* 21.7* 24.3* 21.8*  MCV 95.2 95.6 94.8 96.0 97.3  PLT 93* 97* 90* 97* 79*   Cardiac Enzymes:  Recent Labs Lab 04/15/14 0508  CKTOTAL 169   BNP: Invalid input(s): POCBNP CBG: No results for input(s): GLUCAP in the last 168  hours.  Recent Results (from the past 240 hour(s))  Surgical pcr screen     Status: None   Collection Time: 04/12/14  1:29 AM  Result Value Ref Range Status   MRSA, PCR NEGATIVE NEGATIVE Final   Staphylococcus aureus NEGATIVE NEGATIVE Final    Comment:        The Xpert SA Assay (FDA approved for NASAL specimens in patients over 44 years of age), is one component of a comprehensive surveillance program.  Test performance has been validated by [employer] for patients greater than or equal to 62 year old. It is not intended to diagnose infection nor to guide or monitor treatment.   Surgical PCR screen     Status: None   Collection Time: 04/16/14  7:45 AM  Result Value Ref Range Status   MRSA, PCR NEGATIVE NEGATIVE Final   Staphylococcus aureus NEGATIVE NEGATIVE Final    Comment:        The Xpert SA Assay (FDA approved for NASAL specimens in patients over 25 years of age), is one component of a comprehensive surveillance program.  Test performance has been validated by [employer] for patients greater than or equal to 64 year old. It is not intended to diagnose infection nor to guide or monitor treatment.   Wound culture     Status: None   Collection Time: 04/16/14  6:42 PM  Result Value Ref Range Status   Specimen Description WOUND BACK  Final   Special Requests NOC L5 S1 DISC SPACE  Final   Gram Stain   Final    NO WBC SEEN NO SQUAMOUS EPITHELIAL CELLS SEEN NO ORGANISMS SEEN Performed at [facility]    Culture   Final    NO GROWTH 2 DAYS Performed at [facility]    Report Status 04/19/2014 FINAL  Final  Wound culture     Status: None   Collection Time: 04/16/14  6:42 PM  Result Value Ref Range Status   Specimen Description WOUND BACK  Final   Special Requests NO A L5 S1 EPIDURAL SPACE  Final   Gram Stain   Final    NO WBC SEEN NO SQUAMOUS EPITHELIAL CELLS SEEN NO ORGANISMS SEEN Performed at [facility]   Final  NO GROWTH 2 DAYS Performed at [facility]    Report Status 04/19/2014 FINAL  Final  Wound culture     Status: None   Collection Time: 04/16/14  6:42 PM  Result Value Ref Range Status   Specimen Description WOUND BACK  Final   Special Requests NOB L5 S1 EPIDURAL SPACE  Final   Gram Stain   Final    NO WBC SEEN NO SQUAMOUS EPITHELIAL CELLS SEEN NO ORGANISMS SEEN Performed at [facility]    Culture   Final    NO GROWTH 2 DAYS Performed at [facility]    Report Status 04/19/2014 FINAL  Final  Anaerobic culture     Status: None   Collection Time: 04/16/14  6:42 PM  Result Value Ref Range Status   Specimen Description WOUND BACK  Final   Special Requests NO A L5 S1 EPIDURAL SPACE  Final   Gram Stain   Final    NO WBC SEEN NO SQUAMOUS EPITHELIAL CELLS SEEN NO ORGANISMS SEEN Performed at [facility]    Culture   Final    NO ANAEROBES ISOLATED Performed at [facility]    Report Status 04/21/2014 FINAL  Final  Anaerobic culture     Status: None   Collection Time: 04/16/14  6:42 PM  Result Value Ref Range Status   Specimen Description WOUND BACK  Final   Special Requests NO B L5 S1 EPIDURAL SPACE  Final   Gram Stain   Final    NO WBC SEEN NO SQUAMOUS EPITHELIAL CELLS SEEN NO ORGANISMS SEEN Performed at [facility]    Culture   Final    NO ANAEROBES ISOLATED Performed at [facility]    Report Status 04/21/2014 FINAL  Final  Anaerobic culture     Status: None   Collection Time: 04/16/14  6:42 PM  Result Value Ref Range Status   Specimen Description WOUND BACK  Final   Special Requests NO C L5 S1 DISC SPACE  Final   Gram Stain   Final    NO WBC SEEN NO SQUAMOUS EPITHELIAL CELLS SEEN NO ORGANISMS SEEN Performed at [facility]    Culture   Final    NO ANAEROBES ISOLATED Performed at [facility]    Report Status 04/21/2014 FINAL  Final  Anaerobic culture     Status: None   Collection  Time: 04/16/14  6:42 PM  Result Value Ref Range Status   Specimen Description WOUND BACK  Final   Special Requests NO D L5 S1 DISC SPACE  Final   Gram Stain   Final    NO WBC SEEN NO SQUAMOUS EPITHELIAL CELLS SEEN NO ORGANISMS SEEN Performed at [facility]    Culture   Final    NO ANAEROBES ISOLATED Performed at [facility]    Report Status 04/21/2014 FINAL  Final  Wound culture     Status: None   Collection Time: 04/16/14  6:43 PM  Result Value Ref Range Status   Specimen Description WOUND BACK  Final   Special Requests NOD L5 S1 DISC SPACE  Final   Gram Stain   Final    RARE WBC PRESENT, PREDOMINANTLY MONONUCLEAR NO SQUAMOUS EPITHELIAL CELLS SEEN NO ORGANISMS SEEN Performed at [facility]    Culture   Final    NO GROWTH 2 DAYS Performed at [facility]    Report Status 04/20/2014 FINAL  Final  Fungus Culture  with Smear     Status: None (Preliminary result)   Collection Time: 04/16/14  6:44 PM  Result Value Ref Range Status   Specimen Description WOUND BACK  Final   Special Requests NO A L5 S1 EPIDURAL SPACE  Final   Fungal Smear   Final    NO YEAST OR FUNGAL ELEMENTS SEEN Performed at [facility]    Culture   Final    CULTURE IN PROGRESS FOR FOUR WEEKS Performed at [facility]    Report Status PENDING  Incomplete  Fungus Culture with Smear     Status: None (Preliminary result)   Collection Time: 04/16/14  6:44 PM  Result Value Ref Range Status   Specimen Description WOUND BACK  Final   Special Requests NOB SWABS L5 S1 EPIDURAL SPACE  Final   Fungal Smear   Final    NO YEAST OR FUNGAL ELEMENTS SEEN Performed at [facility]    Culture   Final    CULTURE IN PROGRESS FOR FOUR WEEKS Performed at [facility]    Report Status PENDING  Incomplete  Fungus Culture with Smear     Status: None (Preliminary result)   Collection Time: 04/16/14  6:44 PM  Result Value Ref Range Status   Specimen  Description WOUND BACK  Final   Special Requests NOC L5 S1 DISC SPACE  Final   Fungal Smear   Final    NO YEAST OR FUNGAL ELEMENTS SEEN Performed at [facility]    Culture   Final    CULTURE IN PROGRESS FOR FOUR WEEKS Performed at [facility]    Report Status PENDING  Incomplete  Fungus Culture with Smear     Status: None (Preliminary result)   Collection Time: 04/16/14  6:44 PM  Result Value Ref Range Status   Specimen Description WOUND BACK  Final   Special Requests NOD L5 S1 DISC SPACE  Final   Fungal Smear   Final    NO YEAST OR FUNGAL ELEMENTS SEEN Performed at [facility]    Culture   Final    CULTURE IN PROGRESS FOR FOUR WEEKS Performed at [facility]    Report Status PENDING  Incomplete  AFB culture with smear     Status: None (Preliminary result)   Collection Time: 04/16/14  6:44 PM  Result Value Ref Range Status   Specimen Description WOUND BACK  Final   Special Requests NO A L5 S1 EPIDURAL SPACE  Final   Acid Fast Smear   Final    NO ACID FAST BACILLI SEEN Performed at [facility]    Culture   Final    CULTURE WILL BE EXAMINED FOR 6 WEEKS BEFORE ISSUING A FINAL REPORT Performed at [facility]    Report Status PENDING  Incomplete  AFB culture with smear     Status: None (Preliminary result)   Collection Time: 04/16/14  6:44 PM  Result Value Ref Range Status   Specimen Description WOUND BACK  Final   Special Requests NOB EPIDURAL SPACE  Final   Acid Fast Smear   Final    NO ACID FAST BACILLI SEEN Performed at [facility]    Culture   Final    CULTURE WILL BE EXAMINED FOR 6 WEEKS BEFORE ISSUING A FINAL REPORT Performed at [facility]    Report Status PENDING  Incomplete  AFB culture with smear     Status: None (Preliminary result)  Collection Time: 04/16/14  6:44 PM  Result Value Ref Range Status   Specimen Description WOUND BACK  Final   Special Requests NOC L5 S1 DISC SPACE   Final   Acid Fast Smear   Final    NO ACID FAST BACILLI SEEN Performed at [facility]    Culture   Final    CULTURE WILL BE EXAMINED FOR 6 WEEKS BEFORE ISSUING A FINAL REPORT Performed at [facility]    Report Status PENDING  Incomplete  AFB culture with smear     Status: None (Preliminary result)   Collection Time: 04/16/14  6:44 PM  Result Value Ref Range Status   Specimen Description WOUND BACK  Final   Special Requests NOD L5 S1 DISC SPACE  Final   Acid Fast Smear   Final    NO ACID FAST BACILLI SEEN Performed at [facility]    Culture   Final    CULTURE WILL BE EXAMINED FOR 6 WEEKS BEFORE ISSUING A FINAL REPORT Performed at [facility]    Report Status PENDING  Incomplete  Body fluid culture     Status: None (Preliminary result)   Collection Time: 04/20/14  1:54 PM  Result Value Ref Range Status   Specimen Description FLUID SYNOVIAL RIGHT HIP  Final   Special Requests NONE  Final   Gram Stain PENDING  Incomplete   Culture   Final    NO GROWTH 1 DAY Performed at [facility]    Report Status PENDING  Incomplete     Scheduled Meds: . atovaquone  1,500 mg Oral Daily  . azithromycin  1,200 mg Oral Weekly  . buPROPion  300 mg Oral Daily  . clonazePAM  1 mg Oral QHS  . Darunavir Ethanolate  800 mg Oral Q breakfast  . docusate sodium  100 mg Oral BID  . emtricitabine-tenofovir  1 tablet Oral QHS  . feeding supplement (ENSURE COMPLETE)  237 mL Oral BID BM  . feeding supplement (ENSURE)  1 Container Oral TID BM  . gabapentin  400 mg Oral TID  . micafungin [facility]) IV  150 mg Intravenous Daily  . morphine  60 mg Oral Q12H  . polyethylene glycol  17 g Oral Daily  . ritonavir  100 mg Oral Q breakfast  . senna-docusate  2 tablet Oral Daily  . sucralfate  1 g Oral TID WC  . vancomycin  1,000 mg Intravenous Q8H   Continuous Infusions: . sodium chloride 10 mL/hr at 04/16/14 2235     Delshon Blanchfield, DO  [facility] Pager [phone]  If 7PM-7AM, please contact night-coverage [email or web address] Password [email or web address] 04/21/2014, 5:02 PM   LOS: 29 days

## 2014-04-21 NOTE — Progress Notes (Signed)
Patient ID: Samuel Schultz, male   DOB: [birth year], 38 y.o.   MRN: [id number] BP 111/62 mmHg  Pulse 90  Temp(Src) 98.8 F (37.1 C) (Oral)  Resp 18  Ht 5\' 8"  (1.727 m)  Wt 72.3 kg (159 lb 6.3 oz)  BMI 24.24 kg/m2  SpO2 97% Alert and oriented Tried to remove dressing, patient screamed and would like to try a later time.  Stable exam

## 2014-04-22 DIAGNOSIS — M25552 Pain in left hip: Secondary | ICD-10-CM

## 2014-04-22 LAB — VANCOMYCIN, TROUGH: VANCOMYCIN TR: 11.8 ug/mL (ref 10.0–20.0)

## 2014-04-22 MED ORDER — VANCOMYCIN HCL 10 G IV SOLR
1250.0000 mg | Freq: Three times a day (TID) | INTRAVENOUS | Status: DC
  Administered 2014-04-22 – 2014-04-23 (×3): 1250 mg via INTRAVENOUS
  Filled 2014-04-22 (×5): qty 1250

## 2014-04-22 MED ORDER — MORPHINE SULFATE 2 MG/ML IJ SOLN
2.0000 mg | Freq: Once | INTRAMUSCULAR | Status: AC
  Administered 2014-04-22: 2 mg via INTRAVENOUS
  Filled 2014-04-22: qty 1

## 2014-04-22 NOTE — Progress Notes (Signed)
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 He was admitted to [facility], from [facility] rehabilitation facility. He was recently in the hospital for treatment of L3-L4 discitis and abscess at the L5-S1 level. He was discharged on cefepime, daptomycin and fluconazole. In the nursing facility patient complained about worsening back pain, brought back to the ED and repeat MRI of the back showed worsening of L3-4 discitis and new discitis at the level of T10-11. He was admitted to the hospital for further evaluation, started on daptomycin and micafungin per ID. On 11/3 ID stopped his antbiotic and antfungal therapy and requested that Neurosurgery re-evaluate the patient and attempt to obtain deep cultures in order to target treatment. Left-sided L5-S1 laminectomy and microdiscectomy originally planned for 04/12/2014, but delayed due to elevated INR. Patient eventually had surgery on 11/9 and his antibiotics were restarted on 11/10. An MRI of the right hip showed severe arthritis 11/12. IR aspirated fluid from the hip on 11/13 to rule out septic arthritis. Our current plan is for Mr. Samuel Schultz to d/c to SNF on Monday after initial results have returned on the fluid cultures from his hip.    Assessment/Plan: Acute discitis:  Recent h/o L3-4 discitis and L5-S1 abscess. Came in with new discitis at T10-11, worsening L5-S1 abscess, and persistent L3-4 discitis. Seen by ID here and placed on Micafungin and daptomycin 8 mg/kg. These were subsequently discontinued on 11/3 in preparation for deep biopsy.   Status post Left-sided L5-S1 laminectomy and microdiscectomy on 11/9. Following the surgery, infectious disease restarted micafungin. Currently operative wound cultures, fungal cultures, and  AFB show no growth to date.  MRI bilateral hip completed. Results are concerning for right hip septic arthritis. (04/20/14) IR arthrocentesis fluid from the joint space and sent for cultures.  Patient ambulating with PT and walker.  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  Suspect releated to narcotics and constipation. Now having BM's on senna.  Tolerating soft diet well.  Back Pain:   Secondary to diskitis with a component of anxiety.   On Klonopin QHS. Pain is overall better 6/10. Back brace that was added for support is helping him become more mobile.   Pain/Anxiety management regimen: Continue MS Contin 60 mg bid, MSIR 15 mg prn. Has Xanax 0.25 mg bid prn, klonopin qhs. Will add limited IV dilaudid for 24 hours after hip aspiration. Patient is not pain seeking.  AIDS/HIV infection:   Absolute CD4 count is 40 on 01/27/14. Viral load is undetectable.  Patient is on Truvada, Prezista and Norvir. Azithromycin for MAI prophylaxis.   Infectious Disease is on board appreciate their assistance and recommendations.  Atovaquone for PCP prophylaxis  Cirrhosis:   Reportedly secondary  to previous lymphoma infiltration to the liver and R-CHOP chemotherapy.   Radiological evidence of cirrhosis and portal hypertension, with massive splenomegaly and esophageal varices.   With evidence of  decompensation given coagulopathy  Pressure ulceration at sacrum  Air Overlay mattress in place. Foam dressing.  Improved.  Appreciate wound care consult  Constipation:   Resolved.  On bowel regimen with Senna and Miralax.  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  Labs show stable leukopenia, anemia and thrombocytopenia.  Will continue to monitor as needed while patient is in hospital.  Severe protein calorie malnutrition: Continue with diet supplements after discharge  -patient intermittently refuses  History large B-cell lymphoma 2009:   Metastasis to the back. Was treated with chemotherapy in 2009.   Bone marrow biopsy done on 11/02/2013 in the [facility], showed no evidence of recurrent disease.  DVT Prophylaxis: SCDs  Code Status: Full Family Communication: discussed with patient  Disposition Plan: SNF 04/23/14 if stable   Consultants:  ID  Neurosurgery.  Interventional Radiology  Procedures:  None  Antibiotics:  Micafungin 10/17>> 04/10/2014  Daptomycin continued since discharge last time >> 04/10/2014  Micafungin 04/17/2014 >>  Vancomycin 04/19/2014 >>  Family Communication: Pt alert and oriented Disposition Plan: [facility] on 04/23/14 if stable      Procedures/Studies: Dg Lumbar Spine 2-3 Views  04/16/2014   CLINICAL DATA:  L5-S1 laminectomy.  EXAM: LUMBAR SPINE - 2-3 VIEW  COMPARISON:  03/23/2014.  FINDINGS: Series of 2 intraoperative lateral radiographs for localization purposes demonstrate exposure and a probe over the dorsal aspect of the L5-S1 interspinous space.  IMPRESSION: Intraoperative localization at L5-S1.   Electronically Signed   By: Dereck Ligas M.D.   On: 04/16/2014 21:11   Dg Hip Complete Left  04/11/2014   CLINICAL DATA:  Patient felt pop in left hip with left hip pain. History of spinal infection.  EXAM: LEFT  HIP - COMPLETE 2+ VIEW  COMPARISON:  CT 03/23/2014 and plain film 11/12/2013  FINDINGS: There are minimal degenerative changes of the right hip. There is no evidence of osteolysis. There is no fracture or dislocation. Mild degenerative change of the lumbar spine.  IMPRESSION: No acute findings.  Mild degenerative change of the right hip.   Electronically Signed   By: Marin Olp M.D.   On: 04/11/2014 07:59   Mr Lumbar Spine Wo Contrast  03/23/2014   CLINICAL DATA:  Multilevel infectious discitis. HIV infection. Lymphoma. Left flank pain of 2 days duration.  EXAM: MRI LUMBAR SPINE WITHOUT CONTRAST  TECHNIQUE: Multiplanar, multisequence MR imaging of the lumbar spine was performed. No intravenous contrast was administered.  COMPARISON:  CT scan same day.  Previous MRI 01/26/2014  FINDINGS: The scan covers the region from lower T9 through S3.  T9-10 does not appear involved.  T10-11: Discitis osteomyelitis at this level with fluid intensity material in the disc space and marrow edema throughout the T10 and T11 vertebral bodies. Slight loss of height at the endplates. No evidence of epidural abscess. No cord compression.  T11-12: No evidence of involvement at this level.  T12-L1 L1-2:  Normal.  L2-3: New abnormality on the left with a superior endplate Schmorl's node type pattern at L3. This could be evidence of early disc space infection at this level or this could be an actual an related Schmorl's node.  L3-4: Persistent evidence of discitis osteomyelitis at this level with edema in the disc and  vertebral bodies but no evidence of progression. No spinal abscess.  L4-5:  No evidence of involvement of this disc space.  L5-S1: Worsened pattern with fluid throughout the disc space and worsened edema within the L5 and S1 vertebral bodies. There appears to be either a left posterior lateral disc herniation with caudal migration or some epidural abscess on the left behind S1. The thecal sac is not compressed. There  would be potential to affect the S1 nerve roots.  IMPRESSION: Newly seen discitis osteomyelitis at T10-11. Fluid in the disc space. Early collapse of the endplates. No spinal abscess or neural compression.  Newly seen abnormality on the left at L2-3 which looks like a superior endplate Schmorl's node but could be evidence of early infection developing at the L2-3 disc level.  Persistent osteomyelitis discitis at L3-4, similar to the previous study. No abscess or neural compression.  Marked worsening of the appearance at L5-S1 with fluid throughout the disc space, edema within the L5 and S1 vertebral bodies, an abnormal material in the ventral epidural space that could be extruded disc material or ventral epidural abscess. The central canal is not compressed but there would be potential to affect the S1 nerve roots.   Electronically Signed   By: Nelson Chimes M.D.   On: 03/23/2014 20:21   Mr Hip Right W Wo Contrast  04/19/2014   CLINICAL DATA:  RIGHT hip pain. HIV. History of lymphoma. Coagulopathy.  EXAM: MRI OF THE BILATERAL HIPS WITHOUT AND WITH CONTRAST  TECHNIQUE: Multiplanar, multisequence MR imaging was performed both before and after administration of intravenous contrast.  CONTRAST:  23m MULTIHANCE GADOBENATE DIMEGLUMINE 529 MG/ML IV SOLN  COMPARISON:  04/11/2014 radiographs. MRI 04/23/2014. CT 03/23/2014.  FINDINGS: Severe arthritis of the RIGHT hip. Small effusion is present with diffuse synovitis. The joint capsule enhances diffusely and there is bone marrow edema in the acetabulum and femoral head. The LEFT hip appears within normal limits. Nonspecific edema is present along both iliac wings.  There is a benign-appearing lesion in the supra-acetabular LEFT ilium extending to the posterior column measuring 24 mm x 14 mm. Comparing to CT scans dating back to 10/16/2013, this is compatible with a benign chondroid lesion. This shows low level enhancement after contrast administration. Similar lesion is  present in the RIGHT medial iliac bone. 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  There is mild muscular edema around the RIGHT hip which is probably reactive. No soft tissue abscess. Muscular edema is also present in the LEFT proximal rectus femoris muscle. The cause for this is unclear and could represent strain. Infectious myositis is a consideration. Common hamstring origins appear normal. The pelvic rings are intact. Urinary bladder and visceral pelvis grossly within normal limits.  L5-S1 osteomyelitis/discitis noted. Similar but much less pronounced changes are present at L3-L4.  IMPRESSION: 1. RIGHT hip arthritis with mild narrowing of the joint space and severe synovitis with only a small amount of joint effusion. This is most concerning for septic arthritis in a patient with discitis/osteomyelitis and HIV. Inflammatory arthritis is in the differential considerations. 2. Critical Value/emergent results were called by telephone at the time of interpretation on 04/19/2014 at 4:36 pm to Dr. CRhina BrackettDAM , who verbally acknowledged these results. We discussed the differential considerations and given the patient's other sites of infection, this is most likely an atypical presentation of septic arthritis. Although aspiration may not yield because of the scant amount of joint fluid, it could  be useful. 3. Normal appearance of the LEFT hip.   Electronically Signed   By: Dereck Ligas M.D.   On: 04/19/2014 16:43   Mr Hip Left W Wo Contrast  04/19/2014   CLINICAL DATA:  RIGHT hip pain. HIV. History of lymphoma. Coagulopathy.  EXAM: MRI OF THE BILATERAL HIPS WITHOUT AND WITH CONTRAST  TECHNIQUE: Multiplanar, multisequence MR imaging was performed both before and after administration of intravenous contrast.  CONTRAST:  38m MULTIHANCE GADOBENATE DIMEGLUMINE 529 MG/ML IV SOLN  COMPARISON:  04/11/2014 radiographs. MRI 04/23/2014. CT 03/23/2014.  FINDINGS:  Severe arthritis of the RIGHT hip. Small effusion is present with diffuse synovitis. The joint capsule enhances diffusely and there is bone marrow edema in the acetabulum and femoral head. The LEFT hip appears within normal limits. Nonspecific edema is present along both iliac wings.  There is a benign-appearing lesion in the supra-acetabular LEFT ilium extending to the posterior column measuring 24 mm x 14 mm. Comparing to CT scans dating back to 10/16/2013, this is compatible with a benign chondroid lesion. This shows low level enhancement after contrast administration. Similar lesion is present in the RIGHT medial iliac bone. 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  There is mild muscular edema around the RIGHT hip which is probably reactive. No soft tissue abscess. Muscular edema is also present in the LEFT proximal rectus femoris muscle. The cause for this is unclear and could represent strain. Infectious myositis is a consideration. Common hamstring origins appear normal. The pelvic rings are intact. Urinary bladder and visceral pelvis grossly within normal limits.  L5-S1 osteomyelitis/discitis noted. Similar but much less pronounced changes are present at L3-L4.  IMPRESSION: 1. RIGHT hip arthritis with mild narrowing of the joint space and severe synovitis with only a small amount of joint effusion. This is most concerning for septic arthritis in a patient with discitis/osteomyelitis and HIV. Inflammatory arthritis is in the differential considerations. 2. Critical Value/emergent results were called by telephone at the time of interpretation on 04/19/2014 at 4:36 pm to Dr. CRhina BrackettDAM , who verbally acknowledged these results. We discussed the differential considerations and given the patient's other sites of infection, this is most likely an atypical presentation of septic arthritis. Although aspiration may not yield because of the scant amount of  joint fluid, it could be useful. 3. Normal appearance of the LEFT hip.   Electronically Signed   By: GDereck LigasM.D.   On: 04/19/2014 16:43   Dg Fluoro Guide Ndl Plc/bx  04/20/2014   CLINICAL DATA:  RIGHT hip arthritis. Septic arthritis. Patient with HIV and discitis/osteomyelitis.  EXAM: FLUOROSCOPICALLY GUIDED RIGHT hip ASPIRATION.  FLUOROSCOPY TIME:  0 min 19 seconds  PROCEDURE: After a thorough discussion of risks and benefits of the procedure including the general risks of bleeding, infection, injury to nerves, blood vessels, adjacent structures, and allergic reaction, verbal and written consent was obtained. Verbal consent was obtained by Dr. LGerilyn Pilgrim Specific risks of this procedure included false positive and false negative results and introduction of infection into the joint. Time-out form was completed when appropriate.  The joint was localized under fluoroscopy. A skin entry site anterior to the joint was chosen and marked. The skin was prepped and draped in the usual sterile fashion with Betadine soap. Local anesthesia and deep anesthesia was provided with 1% lidocaine without epinephrine. Careful attention was paid not to inject bacteriostatic lidocaine into the joint. Under fluoroscopic guidance 3.5 inch 20 gauge spinal needle  was advanced into the joint. Vigorous aspiration was performed which yielded nothing initially.  Subsequently, injection of 1 ml of omnipaque 180 contrast agent into the joint showed opacification intra-articular placement. This was subsequently aspirated. 8 mL of sterile saline was injected into the joint. This was after intra-articular needle position was confirmed. Subsequently, this was aspirated yielding 2 mL of clear blood-tinged fluid. The needle was removed and a sterile dressing applied.  IMPRESSION: 1. Successful fluoroscopically guided RIGHT hip aspiration. 2. Dry tap. Saline lavage was performed after confirming needle position with contrast injection. 3. 2 mL  aspirated after saline lavage and sent to laboratory for analysis.   Electronically Signed   By: Dereck Ligas M.D.   On: 04/20/2014 14:27         Subjective: Patient complains of left hip pain today. Denies any fevers, chills, chest pain, shortness breath, nausea, vomiting, diarrhea. He did have a bowel movement today. Feels that he moved or turned in a wrong way and pulled a muscle  In his left buttock  Objective: Filed Vitals:   04/21/14 2109 04/22/14 0511 04/22/14 0512 04/22/14 1618  BP: 124/70  111/56 109/59  Pulse: 94  78 85  Temp: 98.6 F (37 C)  97.6 F (36.4 C) 99.1 F (37.3 C)  TempSrc: Oral  Oral Oral  Resp: 20  20 18   Height:      Weight:  68.9 kg (151 lb 14.4 oz)    SpO2: 99%  97% 99%    Intake/Output Summary (Last 24 hours) at 04/22/14 1659 Last data filed at 04/22/14 1017  Gross per 24 hour  Intake 394.33 ml  Output   1125 ml  Net -730.67 ml   Weight change: -3.4 kg (-7 lb 7.9 oz) Exam:   General:  Pt is alert, follows commands appropriately, not in acute distress  HEENT: No icterus, No thrush,  [place]/AT  Cardiovascular: RRR, S1/S2, no rubs, no gallops  Respiratory: CTA bilaterally, no wheezing, no crackles, no rhonchi  Abdomen: Soft/+BS, non tender, non distended, no guarding  Extremities: No edema, No lymphangitis, No petechiae, No rashes, no synovitis;  Left hip without erythema or edema  Data Reviewed: Basic Metabolic Panel:  Recent Labs Lab 04/16/14 0522 04/17/14 0459 04/18/14 0530 04/21/14 0609  NA 135* 134* 133* 133*  K 3.4* 3.9 3.5* 3.8  CL 103 102 101 102  CO2 24 21 21 20   GLUCOSE 90 63* 75 96  BUN 7 5* 7 5*  CREATININE 0.61 0.50 0.58 0.46*  CALCIUM 8.3* 8.8 8.6 8.1*   Liver Function Tests: No results for input(s): AST, ALT, ALKPHOS, BILITOT, PROT, ALBUMIN in the last 168 hours. No results for input(s): LIPASE, AMYLASE in the last 168 hours. No results for input(s): AMMONIA in the last 168 hours. CBC:  Recent Labs Lab  04/16/14 0522 04/17/14 0459 04/18/14 0530 04/21/14 0609  WBC 2.0* 1.9* 2.1* 1.5*  HGB 7.3* 7.2* 8.1* 7.3*  HCT 21.7* 21.7* 24.3* 21.8*  MCV 95.6 94.8 96.0 97.3  PLT 97* 90* 97* 79*   Cardiac Enzymes: No results for input(s): CKTOTAL, CKMB, CKMBINDEX, TROPONINI in the last 168 hours. BNP: Invalid input(s): POCBNP CBG: No results for input(s): GLUCAP in the last 168 hours.  Recent Results (from the past 240 hour(s))  Surgical PCR screen     Status: None   Collection Time: 04/16/14  7:45 AM  Result Value Ref Range Status   MRSA, PCR NEGATIVE NEGATIVE Final   Staphylococcus aureus NEGATIVE NEGATIVE Final  Comment:        The Xpert SA Assay (FDA approved for NASAL specimens in patients over 88 years of age), is one component of a comprehensive surveillance program.  Test performance has been validated by [employer] for patients greater than or equal to 70 year old. It is not intended to diagnose infection nor to guide or monitor treatment.   Wound culture     Status: None   Collection Time: 04/16/14  6:42 PM  Result Value Ref Range Status   Specimen Description WOUND BACK  Final   Special Requests NOC L5 S1 DISC SPACE  Final   Gram Stain   Final    NO WBC SEEN NO SQUAMOUS EPITHELIAL CELLS SEEN NO ORGANISMS SEEN Performed at [facility]    Culture   Final    NO GROWTH 2 DAYS Performed at [facility]    Report Status 04/19/2014 FINAL  Final  Wound culture     Status: None   Collection Time: 04/16/14  6:42 PM  Result Value Ref Range Status   Specimen Description WOUND BACK  Final   Special Requests NO A L5 S1 EPIDURAL SPACE  Final   Gram Stain   Final    NO WBC SEEN NO SQUAMOUS EPITHELIAL CELLS SEEN NO ORGANISMS SEEN Performed at [facility]    Culture   Final    NO GROWTH 2 DAYS Performed at [facility]    Report Status 04/19/2014 FINAL  Final  Wound culture     Status: None   Collection Time: 04/16/14  6:42 PM    Result Value Ref Range Status   Specimen Description WOUND BACK  Final   Special Requests NOB L5 S1 EPIDURAL SPACE  Final   Gram Stain   Final    NO WBC SEEN NO SQUAMOUS EPITHELIAL CELLS SEEN NO ORGANISMS SEEN Performed at [facility]    Culture   Final    NO GROWTH 2 DAYS Performed at [facility]    Report Status 04/19/2014 FINAL  Final  Anaerobic culture     Status: None   Collection Time: 04/16/14  6:42 PM  Result Value Ref Range Status   Specimen Description WOUND BACK  Final   Special Requests NO A L5 S1 EPIDURAL SPACE  Final   Gram Stain   Final    NO WBC SEEN NO SQUAMOUS EPITHELIAL CELLS SEEN NO ORGANISMS SEEN Performed at [facility]    Culture   Final    NO ANAEROBES ISOLATED Performed at [facility]    Report Status 04/21/2014 FINAL  Final  Anaerobic culture     Status: None   Collection Time: 04/16/14  6:42 PM  Result Value Ref Range Status   Specimen Description WOUND BACK  Final   Special Requests NO B L5 S1 EPIDURAL SPACE  Final   Gram Stain   Final    NO WBC SEEN NO SQUAMOUS EPITHELIAL CELLS SEEN NO ORGANISMS SEEN Performed at [facility]    Culture   Final    NO ANAEROBES ISOLATED Performed at [facility]    Report Status 04/21/2014 FINAL  Final  Anaerobic culture     Status: None   Collection Time: 04/16/14  6:42 PM  Result Value Ref Range Status   Specimen Description WOUND BACK  Final   Special Requests NO C L5 S1 DISC SPACE  Final   Gram Stain   Final  NO WBC SEEN NO SQUAMOUS EPITHELIAL CELLS SEEN NO ORGANISMS SEEN Performed at [facility]    Culture   Final    NO ANAEROBES ISOLATED Performed at [facility]    Report Status 04/21/2014 FINAL  Final  Anaerobic culture     Status: None   Collection Time: 04/16/14  6:42 PM  Result Value Ref Range Status   Specimen Description WOUND BACK  Final   Special Requests NO D L5 S1 DISC SPACE  Final   Gram Stain    Final    NO WBC SEEN NO SQUAMOUS EPITHELIAL CELLS SEEN NO ORGANISMS SEEN Performed at [facility]    Culture   Final    NO ANAEROBES ISOLATED Performed at [facility]    Report Status 04/21/2014 FINAL  Final  Wound culture     Status: None   Collection Time: 04/16/14  6:43 PM  Result Value Ref Range Status   Specimen Description WOUND BACK  Final   Special Requests NOD L5 S1 DISC SPACE  Final   Gram Stain   Final    RARE WBC PRESENT, PREDOMINANTLY MONONUCLEAR NO SQUAMOUS EPITHELIAL CELLS SEEN NO ORGANISMS SEEN Performed at [facility]    Culture   Final    NO GROWTH 2 DAYS Performed at [facility]    Report Status 04/20/2014 FINAL  Final  Fungus Culture with Smear     Status: None (Preliminary result)   Collection Time: 04/16/14  6:44 PM  Result Value Ref Range Status   Specimen Description WOUND BACK  Final   Special Requests NO A L5 S1 EPIDURAL SPACE  Final   Fungal Smear   Final    NO YEAST OR FUNGAL ELEMENTS SEEN Performed at [facility]    Culture   Final    CULTURE IN PROGRESS FOR FOUR WEEKS Performed at [facility]    Report Status PENDING  Incomplete  Fungus Culture with Smear     Status: None (Preliminary result)   Collection Time: 04/16/14  6:44 PM  Result Value Ref Range Status   Specimen Description WOUND BACK  Final   Special Requests NOB SWABS L5 S1 EPIDURAL SPACE  Final   Fungal Smear   Final    NO YEAST OR FUNGAL ELEMENTS SEEN Performed at [facility]    Culture   Final    CULTURE IN PROGRESS FOR FOUR WEEKS Performed at [facility]    Report Status PENDING  Incomplete  Fungus Culture with Smear     Status: None (Preliminary result)   Collection Time: 04/16/14  6:44 PM  Result Value Ref Range Status   Specimen Description WOUND BACK  Final   Special Requests NOC L5 S1 DISC SPACE  Final   Fungal Smear   Final    NO YEAST OR FUNGAL ELEMENTS SEEN Performed at [facility]    Culture   Final    CULTURE IN PROGRESS FOR FOUR WEEKS Performed at [facility]    Report Status PENDING  Incomplete  Fungus Culture with Smear     Status: None (Preliminary result)   Collection Time: 04/16/14  6:44 PM  Result Value Ref Range Status   Specimen Description WOUND BACK  Final   Special Requests NOD L5 S1 DISC SPACE  Final   Fungal Smear   Final    NO YEAST OR FUNGAL ELEMENTS SEEN Performed at [facility]  Final    CULTURE IN PROGRESS FOR FOUR WEEKS Performed at [facility]    Report Status PENDING  Incomplete  AFB culture with smear     Status: None (Preliminary result)   Collection Time: 04/16/14  6:44 PM  Result Value Ref Range Status   Specimen Description WOUND BACK  Final   Special Requests NO A L5 S1 EPIDURAL SPACE  Final   Acid Fast Smear   Final    NO ACID FAST BACILLI SEEN Performed at [facility]    Culture   Final    CULTURE WILL BE EXAMINED FOR 6 WEEKS BEFORE ISSUING A FINAL REPORT Performed at [facility]    Report Status PENDING  Incomplete  AFB culture with smear     Status: None (Preliminary result)   Collection Time: 04/16/14  6:44 PM  Result Value Ref Range Status   Specimen Description WOUND BACK  Final   Special Requests NOB EPIDURAL SPACE  Final   Acid Fast Smear   Final    NO ACID FAST BACILLI SEEN Performed at [facility]    Culture   Final    CULTURE WILL BE EXAMINED FOR 6 WEEKS BEFORE ISSUING A FINAL REPORT Performed at [facility]    Report Status PENDING  Incomplete  AFB culture with smear     Status: None (Preliminary result)   Collection Time: 04/16/14  6:44 PM  Result Value Ref Range Status   Specimen Description WOUND BACK  Final   Special Requests NOC L5 S1 DISC SPACE  Final   Acid Fast Smear   Final    NO ACID FAST BACILLI SEEN Performed at [facility]    Culture   Final    CULTURE WILL BE EXAMINED FOR 6 WEEKS BEFORE  ISSUING A FINAL REPORT Performed at [facility]    Report Status PENDING  Incomplete  AFB culture with smear     Status: None (Preliminary result)   Collection Time: 04/16/14  6:44 PM  Result Value Ref Range Status   Specimen Description WOUND BACK  Final   Special Requests NOD L5 S1 DISC SPACE  Final   Acid Fast Smear   Final    NO ACID FAST BACILLI SEEN Performed at [facility]    Culture   Final    CULTURE WILL BE EXAMINED FOR 6 WEEKS BEFORE ISSUING A FINAL REPORT Performed at [facility]    Report Status PENDING  Incomplete  Fungus Culture with Smear     Status: None (Preliminary result)   Collection Time: 04/20/14  1:54 PM  Result Value Ref Range Status   Specimen Description FLUID SYNOVIAL RIGHT HIP  Final   Special Requests NONE  Final   Fungal Smear   Final    NO YEAST OR FUNGAL ELEMENTS SEEN Performed at [facility]    Culture   Final    CULTURE IN PROGRESS FOR FOUR WEEKS Performed at [facility]    Report Status PENDING  Incomplete  Body fluid culture     Status: None (Preliminary result)   Collection Time: 04/20/14  1:54 PM  Result Value Ref Range Status   Specimen Description FLUID SYNOVIAL RIGHT HIP  Final   Special Requests NONE  Final   Gram Stain PENDING  Incomplete   Culture   Final    NO GROWTH 2 DAYS Performed at [facility]    Report Status PENDING  Incomplete     Scheduled Meds: . atovaquone  1,500 mg Oral Daily  . azithromycin  1,200 mg Oral Weekly  . buPROPion  300 mg Oral Daily  . clonazePAM  1 mg Oral QHS  . Darunavir Ethanolate  800 mg Oral Q breakfast  . docusate sodium  100 mg Oral BID  . emtricitabine-tenofovir  1 tablet Oral QHS  . feeding supplement (ENSURE COMPLETE)  237 mL Oral BID BM  . feeding supplement (ENSURE)  1 Container Oral TID BM  . gabapentin  400 mg Oral TID  . micafungin [facility]) IV  150 mg Intravenous Daily  . morphine  60 mg Oral Q12H  .  morphine injection   2 mg Intravenous Once  . polyethylene glycol  17 g Oral Daily  . ritonavir  100 mg Oral Q breakfast  . senna-docusate  2 tablet Oral Daily  . sucralfate  1 g Oral TID WC  . vancomycin  1,000 mg Intravenous Q8H   Continuous Infusions: . sodium chloride 10 mL/hr at 04/22/14 0748     Hitoshi Werts, DO  [facility] Pager [phone]  If 7PM-7AM, please contact night-coverage [email or web address] Password [email or web address] 04/22/2014, 4:59 PM   LOS: 30 days

## 2014-04-22 NOTE — Progress Notes (Signed)
Patient ID: Samuel Schultz, male   DOB: [birth year], 38 y.o.   MRN: [id number] BP 109/59 mmHg  Pulse 85  Temp(Src) 99.1 F (37.3 C) (Oral)  Resp 18  Ht 5\' 8"  (1.727 m)  Wt 68.9 kg (151 lb 14.4 oz)  BMI 23.10 kg/m2  SpO2 99% Would not allow me to look at wound No neurologic changes  No new recs

## 2014-04-22 NOTE — Progress Notes (Signed)
ANTIBIOTIC CONSULT NOTE - Follow Up Pharmacy Consult for Vancomycin Indication: Osteomyelitis   Allergies  Allergen Reactions  . Codeine Anaphylaxis, Hives, Nausea And Vomiting, Nausea Only and Swelling  . Oxycodone Other (See Comments)    Other reaction(s): Dizziness Lightheaded, nausea    Patient Measurements: Height: 5\' 8"  (172.7 cm) Weight: 151 lb 14.4 oz (68.9 kg) IBW/kg (Calculated) : 68.4  Vital Signs: Temp: 99.1 F (37.3 C) (11/15 1618) Temp Source: Oral (11/15 1618) BP: 109/59 mmHg (11/15 1618) Pulse Rate: 85 (11/15 1618) Intake/Output from previous day: 11/14 0701 - 11/15 0700 In: 640 [P.O.:640] Out: 475 [Urine:475] Intake/Output from this shift: Total I/O In: 394.3 [I.V.:394.3] Out: 650 [Urine:650]  Labs:  Recent Labs  04/21/14 0609  WBC 1.5*  HGB 7.3*  PLT 79*  CREATININE 0.46*   Estimated Creatinine Clearance: 121.1 mL/min (by C-G formula based on Cr of 0.46).  Recent Labs  04/22/14 1738  VANCOTROUGH 11.8     Microbiology: Recent Results (from the past 720 hour(s))  Surgical pcr screen     Status: None   Collection Time: 03/24/14  1:51 AM  Result Value Ref Range Status   MRSA, PCR NEGATIVE NEGATIVE Final   Staphylococcus aureus NEGATIVE NEGATIVE Final    Comment:        The Xpert SA Assay (FDA approved for NASAL specimens in patients over 72 years of age), is one component of a comprehensive surveillance program.  Test performance has been validated by [employer] for patients greater than or equal to 71 year old. It is not intended to diagnose infection nor to guide or monitor treatment.  Surgical pcr screen     Status: None   Collection Time: 04/12/14  1:29 AM  Result Value Ref Range Status   MRSA, PCR NEGATIVE NEGATIVE Final   Staphylococcus aureus NEGATIVE NEGATIVE Final    Comment:        The Xpert SA Assay (FDA approved for NASAL specimens in patients over 48 years of age), is one component of a comprehensive  surveillance program.  Test performance has been validated by [employer] for patients greater than or equal to 45 year old. It is not intended to diagnose infection nor to guide or monitor treatment.   Surgical PCR screen     Status: None   Collection Time: 04/16/14  7:45 AM  Result Value Ref Range Status   MRSA, PCR NEGATIVE NEGATIVE Final   Staphylococcus aureus NEGATIVE NEGATIVE Final    Comment:        The Xpert SA Assay (FDA approved for NASAL specimens in patients over 34 years of age), is one component of a comprehensive surveillance program.  Test performance has been validated by [employer] for patients greater than or equal to 43 year old. It is not intended to diagnose infection nor to guide or monitor treatment.   Wound culture     Status: None   Collection Time: 04/16/14  6:42 PM  Result Value Ref Range Status   Specimen Description WOUND BACK  Final   Special Requests NOC L5 S1 DISC SPACE  Final   Gram Stain   Final    NO WBC SEEN NO SQUAMOUS EPITHELIAL CELLS SEEN NO ORGANISMS SEEN Performed at [facility]    Culture   Final    NO GROWTH 2 DAYS Performed at [facility]    Report Status 04/19/2014 FINAL  Final  Wound culture     Status: None   Collection Time:  04/16/14  6:42 PM  Result Value Ref Range Status   Specimen Description WOUND BACK  Final   Special Requests NO A L5 S1 EPIDURAL SPACE  Final   Gram Stain   Final    NO WBC SEEN NO SQUAMOUS EPITHELIAL CELLS SEEN NO ORGANISMS SEEN Performed at [facility]    Culture   Final    NO GROWTH 2 DAYS Performed at [facility]    Report Status 04/19/2014 FINAL  Final  Wound culture     Status: None   Collection Time: 04/16/14  6:42 PM  Result Value Ref Range Status   Specimen Description WOUND BACK  Final   Special Requests NOB L5 S1 EPIDURAL SPACE  Final   Gram Stain   Final    NO WBC SEEN NO SQUAMOUS EPITHELIAL CELLS SEEN NO ORGANISMS SEEN Performed  at [facility]    Culture   Final    NO GROWTH 2 DAYS Performed at [facility]    Report Status 04/19/2014 FINAL  Final  Anaerobic culture     Status: None   Collection Time: 04/16/14  6:42 PM  Result Value Ref Range Status   Specimen Description WOUND BACK  Final   Special Requests NO A L5 S1 EPIDURAL SPACE  Final   Gram Stain   Final    NO WBC SEEN NO SQUAMOUS EPITHELIAL CELLS SEEN NO ORGANISMS SEEN Performed at [facility]    Culture   Final    NO ANAEROBES ISOLATED Performed at [facility]    Report Status 04/21/2014 FINAL  Final  Anaerobic culture     Status: None   Collection Time: 04/16/14  6:42 PM  Result Value Ref Range Status   Specimen Description WOUND BACK  Final   Special Requests NO B L5 S1 EPIDURAL SPACE  Final   Gram Stain   Final    NO WBC SEEN NO SQUAMOUS EPITHELIAL CELLS SEEN NO ORGANISMS SEEN Performed at [facility]    Culture   Final    NO ANAEROBES ISOLATED Performed at [facility]    Report Status 04/21/2014 FINAL  Final  Anaerobic culture     Status: None   Collection Time: 04/16/14  6:42 PM  Result Value Ref Range Status   Specimen Description WOUND BACK  Final   Special Requests NO C L5 S1 DISC SPACE  Final   Gram Stain   Final    NO WBC SEEN NO SQUAMOUS EPITHELIAL CELLS SEEN NO ORGANISMS SEEN Performed at [facility]    Culture   Final    NO ANAEROBES ISOLATED Performed at [facility]    Report Status 04/21/2014 FINAL  Final  Anaerobic culture     Status: None   Collection Time: 04/16/14  6:42 PM  Result Value Ref Range Status   Specimen Description WOUND BACK  Final   Special Requests NO D L5 S1 DISC SPACE  Final   Gram Stain   Final    NO WBC SEEN NO SQUAMOUS EPITHELIAL CELLS SEEN NO ORGANISMS SEEN Performed at [facility]    Culture   Final    NO ANAEROBES ISOLATED Performed at [facility]    Report Status 04/21/2014 FINAL   Final  Wound culture     Status: None   Collection Time: 04/16/14  6:43 PM  Result Value Ref Range Status   Specimen Description WOUND BACK  Final   Special  Requests NOD L5 S1 DISC SPACE  Final   Gram Stain   Final    RARE WBC PRESENT, PREDOMINANTLY MONONUCLEAR NO SQUAMOUS EPITHELIAL CELLS SEEN NO ORGANISMS SEEN Performed at [facility]    Culture   Final    NO GROWTH 2 DAYS Performed at [facility]    Report Status 04/20/2014 FINAL  Final  Fungus Culture with Smear     Status: None (Preliminary result)   Collection Time: 04/16/14  6:44 PM  Result Value Ref Range Status   Specimen Description WOUND BACK  Final   Special Requests NO A L5 S1 EPIDURAL SPACE  Final   Fungal Smear   Final    NO YEAST OR FUNGAL ELEMENTS SEEN Performed at [facility]    Culture   Final    CULTURE IN PROGRESS FOR FOUR WEEKS Performed at [facility]    Report Status PENDING  Incomplete  Fungus Culture with Smear     Status: None (Preliminary result)   Collection Time: 04/16/14  6:44 PM  Result Value Ref Range Status   Specimen Description WOUND BACK  Final   Special Requests NOB SWABS L5 S1 EPIDURAL SPACE  Final   Fungal Smear   Final    NO YEAST OR FUNGAL ELEMENTS SEEN Performed at [facility]    Culture   Final    CULTURE IN PROGRESS FOR FOUR WEEKS Performed at [facility]    Report Status PENDING  Incomplete  Fungus Culture with Smear     Status: None (Preliminary result)   Collection Time: 04/16/14  6:44 PM  Result Value Ref Range Status   Specimen Description WOUND BACK  Final   Special Requests NOC L5 S1 DISC SPACE  Final   Fungal Smear   Final    NO YEAST OR FUNGAL ELEMENTS SEEN Performed at [facility]    Culture   Final    CULTURE IN PROGRESS FOR FOUR WEEKS Performed at [facility]    Report Status PENDING  Incomplete  Fungus Culture with Smear     Status: None (Preliminary result)   Collection Time:  04/16/14  6:44 PM  Result Value Ref Range Status   Specimen Description WOUND BACK  Final   Special Requests NOD L5 S1 DISC SPACE  Final   Fungal Smear   Final    NO YEAST OR FUNGAL ELEMENTS SEEN Performed at [facility]    Culture   Final    CULTURE IN PROGRESS FOR FOUR WEEKS Performed at [facility]    Report Status PENDING  Incomplete  AFB culture with smear     Status: None (Preliminary result)   Collection Time: 04/16/14  6:44 PM  Result Value Ref Range Status   Specimen Description WOUND BACK  Final   Special Requests NO A L5 S1 EPIDURAL SPACE  Final   Acid Fast Smear   Final    NO ACID FAST BACILLI SEEN Performed at [facility]    Culture   Final    CULTURE WILL BE EXAMINED FOR 6 WEEKS BEFORE ISSUING A FINAL REPORT Performed at [facility]    Report Status PENDING  Incomplete  AFB culture with smear     Status: None (Preliminary result)   Collection Time: 04/16/14  6:44 PM  Result Value Ref Range Status   Specimen Description WOUND BACK  Final   Special Requests NOB EPIDURAL SPACE  Final  Acid Fast Smear   Final    NO ACID FAST BACILLI SEEN Performed at [facility]    Culture   Final    CULTURE WILL BE EXAMINED FOR 6 WEEKS BEFORE ISSUING A FINAL REPORT Performed at [facility]    Report Status PENDING  Incomplete  AFB culture with smear     Status: None (Preliminary result)   Collection Time: 04/16/14  6:44 PM  Result Value Ref Range Status   Specimen Description WOUND BACK  Final   Special Requests NOC L5 S1 DISC SPACE  Final   Acid Fast Smear   Final    NO ACID FAST BACILLI SEEN Performed at [facility]    Culture   Final    CULTURE WILL BE EXAMINED FOR 6 WEEKS BEFORE ISSUING A FINAL REPORT Performed at [facility]    Report Status PENDING  Incomplete  AFB culture with smear     Status: None (Preliminary result)   Collection Time: 04/16/14  6:44 PM  Result Value Ref Range Status    Specimen Description WOUND BACK  Final   Special Requests NOD L5 S1 DISC SPACE  Final   Acid Fast Smear   Final    NO ACID FAST BACILLI SEEN Performed at [facility]    Culture   Final    CULTURE WILL BE EXAMINED FOR 6 WEEKS BEFORE ISSUING A FINAL REPORT Performed at [facility]    Report Status PENDING  Incomplete  Fungus Culture with Smear     Status: None (Preliminary result)   Collection Time: 04/20/14  1:54 PM  Result Value Ref Range Status   Specimen Description FLUID SYNOVIAL RIGHT HIP  Final   Special Requests NONE  Final   Fungal Smear   Final    NO YEAST OR FUNGAL ELEMENTS SEEN Performed at [facility]    Culture   Final    CULTURE IN PROGRESS FOR FOUR WEEKS Performed at [facility]    Report Status PENDING  Incomplete  Body fluid culture     Status: None (Preliminary result)   Collection Time: 04/20/14  1:54 PM  Result Value Ref Range Status   Specimen Description FLUID SYNOVIAL RIGHT HIP  Final   Special Requests NONE  Final   Gram Stain PENDING  Incomplete   Culture   Final    NO GROWTH 2 DAYS Performed at [facility]    Report Status PENDING  Incomplete    Medical History: Past Medical History  Diagnosis Date  . HIV disease   . Cirrhosis   . PTSD (post-traumatic stress disorder)   . Depressive disorder   . Cancer   . Lymphoma   . Anemia     Medications:  Prescriptions prior to admission  Medication Sig Dispense Refill Last Dose  . atovaquone (MEPRON) 750 MG/5ML suspension Take 750 mg by mouth daily.   03/23/2014 at Unknown time  . buPROPion (WELLBUTRIN XL) 300 MG 24 hr tablet Take 300 mg by mouth daily.   03/23/2014 at 0900  . ceFEPIme 2 g in dextrose 5 % 50 mL Inject 2 g into the vein every 12 (twelve) hours. 1 ampule 0 03/23/2014 at 0900  . cyclobenzaprine (FLEXERIL) 5 MG tablet Take 1 tablet (5 mg total) by mouth at bedtime. 10 tablet 0 03/22/2014 at Unknown time  . Darunavir Ethanolate  (PREZISTA) 800 MG tablet Take 1 tablet (800 mg total) by mouth daily. 30 tablet  2 03/23/2014 at Unknown time  . emtricitabine-tenofovir (TRUVADA) 200-300 MG per tablet Take 1 tablet by mouth at bedtime. 30 tablet 2 03/22/2014 at Unknown time  . ferrous sulfate 325 (65 FE) MG tablet Take 1 tablet (325 mg total) by mouth 2 (two) times daily with a meal. 60 tablet 3 03/23/2014 at Unknown time  . fluconazole (DIFLUCAN) 200 MG tablet Take 2 tablets (400 mg total) by mouth daily. 30 tablet 1 03/23/2014 at Unknown time  . gabapentin (NEURONTIN) 400 MG capsule Take 400 mg by mouth 3 (three) times daily.   03/23/2014 at Unknown time  . omeprazole (PRILOSEC) 20 MG capsule Take 20 mg by mouth 2 (two) times daily.   03/23/2014 at Unknown time  . potassium chloride SA (K-DUR,KLOR-CON) 20 MEQ tablet Take 20 mEq by mouth daily.   03/23/2014 at Unknown time  . promethazine (PHENERGAN) 12.5 MG tablet Take 1 tablet (12.5 mg total) by mouth every 6 (six) hours as needed for nausea or vomiting. 30 tablet 0 03/23/2014 at Unknown time  . ritonavir (NORVIR) 100 MG capsule Take 1 capsule (100 mg total) by mouth daily with breakfast. With Prezista 30 capsule 2 03/23/2014 at Unknown time  . sucralfate (CARAFATE) 1 G tablet Take 1 g by mouth 3 (three) times daily with meals.    03/23/2014 at Unknown time  . temazepam (RESTORIL) 7.5 MG capsule Take 1 capsule (7.5 mg total) by mouth at bedtime as needed for sleep. 30 capsule 5 03/22/2014 at Unknown time  . [DISCONTINUED] clonazePAM (KLONOPIN) 1 MG tablet Take 1 mg by mouth at bedtime.   03/22/2014 at Unknown time  . azithromycin (ZITHROMAX) 600 MG tablet Take 1,200 mg by mouth once a week. saturday   03/17/2014  . methocarbamol (ROBAXIN) 500 MG tablet Take 1 tablet (500 mg total) by mouth every 6 (six) hours as needed for muscle spasms. 20 tablet 0 unknown  . [DISCONTINUED] clonazePAM (KLONOPIN) 0.5 MG tablet Take one tablet by mouth twice daily as needed for anxiety 60 tablet 5  unknown  . [DISCONTINUED] morphine (MS CONTIN) 60 MG 12 hr tablet Take 1 tablet (60 mg total) by mouth every 12 (twelve) hours. 60 tablet 0 unknown  . [DISCONTINUED] morphine (MSIR) 15 MG tablet Take 1 tablet (15 mg total) by mouth every 4 (four) hours as needed for severe pain. 180 tablet 0 unknown   Assessment: 66 YOM who is HIV positive with septic hip despite multiple rounds of IV antibiotics. He was restarted on micafungin and pharmacy consulted to dose empiric vancomycin to cover for coag negative staph and NV strep.   WBC was low and pt is afebrile. CrCl > 100 mLmin.   New Cultures: 11/13: Synovial fluid (R hip)>> 11/13: Fungus Cx >>   Vancomycin trough reported as 11, less than goal.   Goal of Therapy:  Vancomycin trough level 15-20 mcg/ml  Plan:  -Increase vancomycin to 1250 mg IV q8h -Monitor CBC, renal fx, cultures and patient's clinical progress -[facility] at [facility]  Thank you for allowing pharmacy to be a part of this patients care team.  Rowe Robert Pharm.D., BCPS, AQ-Cardiology Clinical Pharmacist 04/22/2014 6:43 PM Pager: [phone] Phone: [phone]

## 2014-04-23 LAB — BODY FLUID CULTURE: Culture: NO GROWTH

## 2014-04-23 LAB — GLUCOSE 6 PHOSPHATE DEHYDROGENASE: G6PDH: 16 U/g{Hb} (ref 7.0–20.5)

## 2014-04-23 MED ORDER — BIOTENE DRY MOUTH MT LIQD: 15.0000 mL | OROMUCOSAL | Status: AC | PRN

## 2014-04-23 MED ORDER — HEPARIN SOD (PORK) LOCK FLUSH 100 UNIT/ML IV SOLN
250.0000 [IU] | INTRAVENOUS | Status: AC | PRN
  Administered 2014-04-23: 250 [IU]

## 2014-04-23 MED ORDER — VANCOMYCIN HCL 10 G IV SOLR: 1250.0000 mg | Freq: Three times a day (TID) | INTRAVENOUS | Status: DC

## 2014-04-23 MED ORDER — DSS 100 MG PO CAPS: 100.0000 mg | ORAL_CAPSULE | Freq: Two times a day (BID) | ORAL | Status: AC

## 2014-04-23 MED ORDER — SUCRALFATE 1 G PO TABS: 1.0000 g | ORAL_TABLET | Freq: Three times a day (TID) | ORAL | Status: AC | PRN

## 2014-04-23 MED ORDER — SODIUM CHLORIDE 0.9 % IV SOLN: 150.0000 mg | Freq: Every day | INTRAVENOUS | Status: DC

## 2014-04-23 MED ORDER — CYCLOBENZAPRINE HCL 10 MG PO TABS: 10.0000 mg | ORAL_TABLET | Freq: Three times a day (TID) | ORAL | Status: DC | PRN

## 2014-04-23 MED ORDER — ACETAMINOPHEN 325 MG PO TABS: 650.0000 mg | ORAL_TABLET | ORAL | Status: AC | PRN

## 2014-04-23 MED ORDER — MORPHINE SULFATE ER 60 MG PO TBCR: 60.0000 mg | EXTENDED_RELEASE_TABLET | Freq: Two times a day (BID) | ORAL | Status: DC

## 2014-04-23 MED ORDER — ENSURE COMPLETE PO LIQD: 237.0000 mL | Freq: Two times a day (BID) | ORAL | Status: AC

## 2014-04-23 MED ORDER — MENTHOL 3 MG MT LOZG: 1.0000 | LOZENGE | OROMUCOSAL | Status: AC | PRN

## 2014-04-23 MED ORDER — CLONAZEPAM 1 MG PO TABS: 1.0000 mg | ORAL_TABLET | Freq: Every day | ORAL | Status: DC

## 2014-04-23 MED ORDER — MORPHINE SULFATE 15 MG PO TABS: 15.0000 mg | ORAL_TABLET | ORAL | Status: DC | PRN

## 2014-04-23 MED ORDER — ALPRAZOLAM 0.25 MG PO TABS: 0.2500 mg | ORAL_TABLET | Freq: Two times a day (BID) | ORAL | Status: DC | PRN

## 2014-04-23 NOTE — Clinical Social Work Placement (Signed)
Clinical Social Work Department CLINICAL SOCIAL WORK PLACEMENT NOTE 04/23/2014  Patient:  Samuel Schultz, Samuel Schultz  Account Number:  [id number] Admit date:  03/23/2014  Clinical Social Worker:  Kemper Durie, [place]  Date/time:  03/26/2014 03:45 PM  Clinical Social Work is seeking post-discharge placement for this patient at the following level of care:   [place]   (*CSW will update this form in Epic as items are completed)   03/26/2014  Patient/family provided with [facility] Department of Clinical Social Work's list of facilities offering this level of care within the geographic area requested by the patient (or if unable, by the patient's family).  03/26/2014  Patient/family informed of their freedom to choose among providers that offer the needed level of care, that participate in Medicare, Medicaid or managed care program needed by the patient, have an available bed and are willing to accept the patient.  03/26/2014  Patient/family informed of [facility]' ownership interest in [facility], as well as of the fact that they are under no obligation to receive care at this facility.  PASARR submitted to EDS on  PASARR number received on   FL2 transmitted to all facilities in geographic area requested by pt/family on  03/26/2014 FL2 transmitted to all facilities within larger geographic area on   Patient informed that his/her managed care company has contracts with or will negotiate with  certain facilities, including the following:     Patient/family informed of bed offers received:  04/09/2014 Patient chooses bed at [facility] Physician recommends and patient chooses bed at    Patient to be transferred to [place] [facility] on  04/23/2014 Patient to be transferred to facility by Ab[facility] Patient and family notified of transfer on 04/23/2014 Name of family member notified:  Patient will notify [place]  The following physician request were entered in  Epic:   Additional Comments:   Per MD patient ready for DC to [facility]. RN, patient, patient's family, and facility notified of DC. RN given number for report. DC packet on chart. [facility] transport requested for patient. CSW signing off.    Liz Beach MSW, [place], [place], [phone]

## 2014-04-23 NOTE — Discharge Summary (Signed)
Physician Discharge Summary  Samuel Schultz TOI:[id number] DOB: 10-13-1975 DOA: 03/23/2014  PCP: [name], MD  Admit date: 03/23/2014 Discharge date: 04/23/2014  Time spent: 45 minutes  Recommendations for Outpatient Follow-up:   Weekly CMET, CBC (Anemic, Elevated LFTs)  Follow up with Infectious Disease (Dr. Tommy Medal) in 2-3 weeks.  Physical therapy at SNF  Continue Vancomycin / Micafungin as directed by Dr. Tommy Medal, for a minimum of 8 weeks.  Follow Right hip synovial fluid bacterial and fungal cultures from 11/13  Follow operative wound cultures, fungal cultures, and AFB from 11/09  Discharge Diagnoses:  Principal Problem:   Diskitis Active Problems:   AIDS   Cirrhosis, non-alcoholic   Pancytopenia   Protein-calorie malnutrition, severe   Discitis   Abscess in epidural space of lumbar spine   Infectious discitis   Discitis of lumbar region   HIV (human immunodeficiency virus infection)   Osteomyelitis of lumbar spine   Fungal osteomyelitis   VRE (vancomycin resistant enterococcus) culture positive   Pain, hip   Left flank pain   Hip pain   Discitis thoracic region   Discharge Condition: stable.  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 He was admitted to [facility], from [facility] rehabilitation facility. He was recently in the hospital for treatment of L3-L4 discitis and abscess at the L5-S1 level. He was discharged on cefepime, daptomycin and fluconazole. In the nursing facility patient complained about worsening back pain.  He was brought back to the ED and repeat MRI of the back showed worsening of L3-4 discitis and new discitis at the level of T10-11. He was  admitted to the hospital for further evaluation, started on daptomycin and micafungin per ID. On 11/3 ID stopped his antbiotic and antfungal therapy and requested that Neurosurgery re-evaluate the patient and attempt to obtain deep cultures in order to target treatment. Left-sided L5-S1 laminectomy and microdiscectomy originally planned for 04/12/2014, but delayed due to elevated INR. Patient eventually had surgery on 11/9 and his antibiotics were restarted on 11/10. An MRI of the right hip showed severe arthritis 11/12. IR aspirated fluid from the hip on 11/13 to rule out septic arthritis. Routine bacterial culture results from the hip fluid show no growth to date.  Fungal cultures are negative preliminarily at time of discharge.   At this point, he is stable and will be discharged to SNF for physical therapy as well as on-going IV vancomycin and micafungin therapy.  He will need these antimicrobials indefinitely given his recurrent infection.  He will have follow up at [facility].  Hospital Course:  Acute discitis:  Recent h/o L3-4 discitis and L5-S1 abscess. Came in with new discitis at T10-11, worsening L5-S1 abscess, and persistent L3-4 discitis. Seen by ID here and placed on Micafungin and daptomycin 8 mg/kg. These were subsequently discontinued on 11/3 in preparation for deep biopsy.   Status post Left-sided L5-S1 laminectomy and microdiscectomy on 11/9. Following the surgery, infectious disease restarted micafungin. Currently operative wound cultures, fungal cultures, and AFB show no growth to date.  MRI bilateral hip completed. Results are concerning for right hip septic arthritis. (04/20/14) IR arthrocentesis fluid from the joint space and sent for cultures.  Preliminary culture results show no growth to date.  Patient ambulating with PT and walker.  Hx of VRE bacteremia in June 2015, but blood cultures neg this admit  Patient will be on antifungal and antibacterial therapy  indefinitely  Right Hip Arthritis with synovitis  Concerning for septic arthritis  Aspirated for culture by IR 11/13--neg at time of d/c  Cell count not suggestive of septic arthritis, although the patient has CD4 count of 40 and decreased cell mediated immunity  Coagulopathy:  Secondary to Cirrhosis from R-CHOP therapy  Nausea with intermittent vomiting:   Resolved.  Suspect releated to narcotics and constipation. Now having BM's on senna.  Tolerating soft diet well.  Back Pain:   Secondary to diskitis with a component of anxiety.   On Klonopin QHS. Pain is overall better 6/10. Back brace that was added for support is helping him become more mobile.   Pain/Anxiety management regimen: Continue MS Contin 60 mg bid, MSIR 15 mg prn. Has Xanax 0.25 mg bid prn, klonopin qhs. Patient did not demonstrate pain medication seeking behavior during his hospital stay.  AIDS/HIV infection:   Absolute CD4 count is 40 on 01/27/14. Viral load is undetectable.  Patient is on Truvada, Prezista and Norvir. Azithromycin for MAI prophylaxis.   Infectious Disease is on board appreciate their assistance and recommendations.  Atovaquone for PCP prophylaxis  Cirrhosis:   Reportedly secondary to previous lymphoma infiltration to the liver and R-CHOP chemotherapy.   Radiological evidence of cirrhosis and portal hypertension, with massive splenomegaly and esophageal varices.   With evidence of decompensation given coagulopathy.  LFTs are currently stable.    Pressure ulceration at sacrum  Air Overlay mattress in place. Foam dressing.  Improved.  Request on-going wound care at SNF.  Constipation:   Resolved.  On bowel regimen with Senna and Miralax.  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  Labs show stable leukopenia, anemia and thrombocytopenia.  Will continue  to monitor as needed while patient is in hospital.  Severe protein calorie malnutrition:   Continue with diet supplements after discharge   patient intermittently refuses  History large B-cell lymphoma 2009:   Metastasis to the back. Was treated with chemotherapy in 2009.   Bone marrow biopsy done on 11/02/2013 in the [facility], showed no evidence of recurrent disease.  Procedures:  Right Hip Joint Aspiration 11/13  Left-sided L5-S1 laminectomy and microdiscectomy on 11/9  Consultations:  Neurosurgery  Infectious Disease  Discharge Exam: Filed Vitals:   04/23/14 0638  BP: 101/53  Pulse:   Temp: 98.4 F (36.9 C)  Resp: 16    General: Pt is alert, follows commands appropriately, not in acute distress, pleasant.  HEENT: No icterus, No thrush, /AT  Cardiovascular: RRR, S1/S2, no rubs, no gallops  Respiratory: CTA bilaterally, no wheezing, no crackles, no rhonchi  Abdomen: Soft/+BS, non tender, non distended, no guarding, no masses.  Extremities: No edema, No lymphangitis, No petechiae, No rashes, no synovitis; Left hip without erythema or edema.  Able to move all 4 extremities.  Skin:  Healing scabbed 1.5" diameter lesion on sacral area.   Discharge Instructions   Discharge Instructions    Call MD for:  severe uncontrolled pain    Complete by:  As directed      Call MD for:  temperature >100.4    Complete by:  As directed      Diet general    Complete by:  As directed      Diet general    Complete by:  As directed      Increase activity slowly    Complete by:  As directed      Increase activity slowly    Complete by:  As directed           Current Discharge Medication List    START taking these medications   Details  acetaminophen (TYLENOL) 325 MG tablet Take 2 tablets (650 mg total) by mouth every 4 (four) hours as needed (temp > 100.5).    ALPRAZolam (XANAX) 0.25 MG tablet Take 1 tablet (0.25 mg total) by mouth 2 (two) times daily as  needed for anxiety. Qty: 30 tablet, Refills: 0    antiseptic oral rinse (BIOTENE) LIQD 15 mLs by Mouth Rinse route as needed for dry mouth.    docusate sodium 100 MG CAPS Take 100 mg by mouth 2 (two) times daily. Qty: 10 capsule, Refills: 0    feeding supplement, ENSURE COMPLETE, (ENSURE COMPLETE) LIQD Take 237 mLs by mouth 2 (two) times daily between meals.    menthol-cetylpyridinium (CEPACOL) 3 MG lozenge Take 1 lozenge (3 mg total) by mouth as needed for sore throat (sore throat). Qty: 100 tablet, Refills: 12    micafungin 150 mg in sodium chloride 0.9 % 100 mL Inject 150 mg into the vein daily. For 6 more weeks from 03/29/14    polyethylene glycol (MIRALAX / GLYCOLAX) packet Take 17 g by mouth daily. Qty: 14 each, Refills: 0    senna-docusate (SENOKOT-S) 8.6-50 MG per tablet Take 2 tablets by mouth daily.    vancomycin 1,250 mg in sodium chloride 0.9 % 250 mL Inject 1,250 mg into the vein every 8 (eight) hours.      CONTINUE these medications which have CHANGED   Details  clonazePAM (KLONOPIN) 1 MG tablet Take 1 tablet (1 mg total) by mouth at bedtime. Qty: 20 tablet, Refills: 0    morphine (MS CONTIN) 60 MG 12 hr tablet Take 1 tablet (60 mg total) by mouth every 12 (twelve) hours. Qty: 30 tablet, Refills: 0    morphine (MSIR) 15 MG tablet Take 1 tablet (15 mg total) by mouth every 4 (four) hours as needed for severe pain. Qty: 30 tablet, Refills: 0    sucralfate (CARAFATE) 1 G tablet Take 1 tablet (1 g total) by mouth 3 (three) times daily with meals as needed (vomiting,).      CONTINUE these medications which have NOT CHANGED   Details  atovaquone (MEPRON) 750 MG/5ML suspension Take 750 mg by mouth daily.    buPROPion (WELLBUTRIN XL) 300 MG 24 hr tablet Take 300 mg by mouth daily.    Darunavir Ethanolate (PREZISTA) 800 MG tablet Take 1 tablet (800 mg total) by mouth daily. Qty: 30 tablet, Refills: 2   Associated Diagnoses: AIDS    emtricitabine-tenofovir (TRUVADA)  200-300 MG per tablet Take 1 tablet by mouth at bedtime. Qty: 30 tablet, Refills: 2   Associated Diagnoses: AIDS    ferrous sulfate 325 (65 FE) MG tablet Take 1 tablet (325 mg total) by mouth 2 (two) times daily with a meal. Qty: 60 tablet, Refills: 3    gabapentin (NEURONTIN) 400 MG capsule Take 400 mg by mouth 3 (three) times daily.    omeprazole (PRILOSEC) 20 MG capsule Take 20 mg by mouth 2 (two) times daily.    promethazine (PHENERGAN) 12.5 MG tablet Take 1 tablet (12.5 mg total) by mouth every 6 (six)  hours as needed for nausea or vomiting. Qty: 30 tablet, Refills: 0    ritonavir (NORVIR) 100 MG capsule Take 1 capsule (100 mg total) by mouth daily with breakfast. With Prezista Qty: 30 capsule, Refills: 2   Associated Diagnoses: AIDS    azithromycin (ZITHROMAX) 600 MG tablet Take 1,200 mg by mouth once a week. saturday    methocarbamol (ROBAXIN) 500 MG tablet Take 1 tablet (500 mg total) by mouth every 6 (six) hours as needed for muscle spasms. Qty: 20 tablet, Refills: 0      STOP taking these medications     ceFEPIme 2 g in dextrose 5 % 50 mL      cyclobenzaprine (FLEXERIL) 5 MG tablet      fluconazole (DIFLUCAN) 200 MG tablet      potassium chloride SA (K-DUR,KLOR-CON) 20 MEQ tablet      temazepam (RESTORIL) 7.5 MG capsule        Allergies  Allergen Reactions  . Codeine Anaphylaxis, Hives, Nausea And Vomiting, Nausea Only and Swelling  . Oxycodone Other (See Comments)    Other reaction(s): Dizziness Lightheaded, nausea   Follow-up Information    Follow up with Bobby Rumpf, MD On 04/02/2014.   Specialty:  Infectious Diseases   Why:  appt at 10 am   Contact information:   [address] [place] [address] [phone]        The results of significant diagnostics from this hospitalization (including imaging, microbiology, ancillary and laboratory) are listed below for reference.    Significant Diagnostic Studies: Dg Lumbar Spine 2-3  Views  04/16/2014   CLINICAL DATA:  L5-S1 laminectomy.  EXAM: LUMBAR SPINE - 2-3 VIEW  COMPARISON:  03/23/2014.  FINDINGS: Series of 2 intraoperative lateral radiographs for localization purposes demonstrate exposure and a probe over the dorsal aspect of the L5-S1 interspinous space.  IMPRESSION: Intraoperative localization at L5-S1.   Electronically Signed   By: Dereck Ligas M.D.   On: 04/16/2014 21:11   Dg Hip Complete Left  04/11/2014   CLINICAL DATA:  Patient felt pop in left hip with left hip pain. History of spinal infection.  EXAM: LEFT HIP - COMPLETE 2+ VIEW  COMPARISON:  CT 03/23/2014 and plain film 11/12/2013  FINDINGS: There are minimal degenerative changes of the right hip. There is no evidence of osteolysis. There is no fracture or dislocation. Mild degenerative change of the lumbar spine.  IMPRESSION: No acute findings.  Mild degenerative change of the right hip.   Electronically Signed   By: Marin Olp M.D.   On: 04/11/2014 07:59   Mr Hip Right W Wo Contrast  04/19/2014   CLINICAL DATA:  RIGHT hip pain. HIV. History of lymphoma. Coagulopathy.  EXAM: MRI OF THE BILATERAL HIPS WITHOUT AND WITH CONTRAST    IMPRESSION: 1. RIGHT hip arthritis with mild narrowing of the joint space and severe synovitis with only a small amount of joint effusion. This is most concerning for septic arthritis in a patient with discitis/osteomyelitis and HIV. Inflammatory arthritis is in the differential considerations. 2. Critical Value/emergent results were called by telephone at the time of interpretation on 04/19/2014 at 4:36 pm to Dr. Rhina Brackett DAM , who verbally acknowledged these results. We discussed the differential considerations and given the patient's other sites of infection, this is most likely an atypical presentation of septic arthritis. Although aspiration may not yield because of the scant amount of joint fluid, it could be useful. 3. Normal appearance of the LEFT  hip.   Electronically Signed    By: Dereck Ligas M.D.   On: 04/19/2014 16:43     Dg Fluoro Guide Ndl Plc/bx  04/20/2014   CLINICAL DATA:  RIGHT hip arthritis. Septic arthritis. Patient with HIV and discitis/osteomyelitis.  EXAM: FLUOROSCOPICALLY GUIDED RIGHT hip ASPIRATION.  FLUOROSCOPY TIME:  0 min 19 seconds  IMPRESSION: 1. Successful fluoroscopically guided RIGHT hip aspiration. 2. Dry tap. Saline lavage was performed after confirming needle position with contrast injection. 3. 2 mL aspirated after saline lavage and sent to laboratory for analysis.   Electronically Signed   By: Dereck Ligas M.D.   On: 04/20/2014 14:27    Microbiology: Recent Results (from the past 240 hour(s))  Surgical PCR screen     Status: None   Collection Time: 04/16/14  7:45 AM  Result Value Ref Range Status   MRSA, PCR NEGATIVE NEGATIVE Final   Staphylococcus aureus NEGATIVE NEGATIVE Final    Comment:        The Xpert SA Assay (FDA approved for NASAL specimens in patients over 59 years of age), is one component of a comprehensive surveillance program.  Test performance has been validated by [employer] for patients greater than or equal to 64 year old. It is not intended to diagnose infection nor to guide or monitor treatment.   Wound culture     Status: None   Collection Time: 04/16/14  6:42 PM  Result Value Ref Range Status   Specimen Description WOUND BACK  Final   Special Requests NOC L5 S1 DISC SPACE  Final   Gram Stain   Final    NO WBC SEEN NO SQUAMOUS EPITHELIAL CELLS SEEN NO ORGANISMS SEEN Performed at [facility]    Culture   Final    NO GROWTH 2 DAYS Performed at [facility]    Report Status 04/19/2014 FINAL  Final  Wound culture     Status: None   Collection Time: 04/16/14  6:42 PM  Result Value Ref Range Status   Specimen Description WOUND BACK  Final   Special Requests NO A L5 S1 EPIDURAL SPACE  Final   Gram Stain   Final    NO WBC SEEN NO SQUAMOUS EPITHELIAL CELLS SEEN NO  ORGANISMS SEEN Performed at [facility]    Culture   Final    NO GROWTH 2 DAYS Performed at [facility]    Report Status 04/19/2014 FINAL  Final  Wound culture     Status: None   Collection Time: 04/16/14  6:42 PM  Result Value Ref Range Status   Specimen Description WOUND BACK  Final   Special Requests NOB L5 S1 EPIDURAL SPACE  Final   Gram Stain   Final    NO WBC SEEN NO SQUAMOUS EPITHELIAL CELLS SEEN NO ORGANISMS SEEN Performed at [facility]    Culture   Final    NO GROWTH 2 DAYS Performed at [facility]    Report Status 04/19/2014 FINAL  Final  Anaerobic culture     Status: None   Collection Time: 04/16/14  6:42 PM  Result Value Ref Range Status   Specimen Description WOUND BACK  Final   Special Requests NO A L5 S1 EPIDURAL SPACE  Final   Gram Stain   Final    NO WBC SEEN NO SQUAMOUS EPITHELIAL CELLS SEEN NO ORGANISMS SEEN Performed at [facility]    Culture   Final    NO ANAEROBES ISOLATED  Performed at [facility]    Report Status 04/21/2014 FINAL  Final  Anaerobic culture     Status: None   Collection Time: 04/16/14  6:42 PM  Result Value Ref Range Status   Specimen Description WOUND BACK  Final   Special Requests NO B L5 S1 EPIDURAL SPACE  Final   Gram Stain   Final    NO WBC SEEN NO SQUAMOUS EPITHELIAL CELLS SEEN NO ORGANISMS SEEN Performed at [facility]    Culture   Final    NO ANAEROBES ISOLATED Performed at [facility]    Report Status 04/21/2014 FINAL  Final  Anaerobic culture     Status: None   Collection Time: 04/16/14  6:42 PM  Result Value Ref Range Status   Specimen Description WOUND BACK  Final   Special Requests NO C L5 S1 DISC SPACE  Final   Gram Stain   Final    NO WBC SEEN NO SQUAMOUS EPITHELIAL CELLS SEEN NO ORGANISMS SEEN Performed at [facility]    Culture   Final    NO ANAEROBES ISOLATED Performed at [facility]    Report Status  04/21/2014 FINAL  Final  Anaerobic culture     Status: None   Collection Time: 04/16/14  6:42 PM  Result Value Ref Range Status   Specimen Description WOUND BACK  Final   Special Requests NO D L5 S1 DISC SPACE  Final   Gram Stain   Final    NO WBC SEEN NO SQUAMOUS EPITHELIAL CELLS SEEN NO ORGANISMS SEEN Performed at [facility]    Culture   Final    NO ANAEROBES ISOLATED Performed at [facility]    Report Status 04/21/2014 FINAL  Final  Wound culture     Status: None   Collection Time: 04/16/14  6:43 PM  Result Value Ref Range Status   Specimen Description WOUND BACK  Final   Special Requests NOD L5 S1 DISC SPACE  Final   Gram Stain   Final    RARE WBC PRESENT, PREDOMINANTLY MONONUCLEAR NO SQUAMOUS EPITHELIAL CELLS SEEN NO ORGANISMS SEEN Performed at [facility]    Culture   Final    NO GROWTH 2 DAYS Performed at [facility]    Report Status 04/20/2014 FINAL  Final  Fungus Culture with Smear     Status: None (Preliminary result)   Collection Time: 04/16/14  6:44 PM  Result Value Ref Range Status   Specimen Description WOUND BACK  Final   Special Requests NO A L5 S1 EPIDURAL SPACE  Final   Fungal Smear   Final    NO YEAST OR FUNGAL ELEMENTS SEEN Performed at [facility]    Culture   Final    CULTURE IN PROGRESS FOR FOUR WEEKS Performed at [facility]    Report Status PENDING  Incomplete  Fungus Culture with Smear     Status: None (Preliminary result)   Collection Time: 04/16/14  6:44 PM  Result Value Ref Range Status   Specimen Description WOUND BACK  Final   Special Requests NOB SWABS L5 S1 EPIDURAL SPACE  Final   Fungal Smear   Final    NO YEAST OR FUNGAL ELEMENTS SEEN Performed at [facility]    Culture   Final    CULTURE IN PROGRESS FOR FOUR WEEKS Performed at [facility]    Report Status PENDING  Incomplete  Fungus Culture with Smear  Status: None (Preliminary result)    Collection Time: 04/16/14  6:44 PM  Result Value Ref Range Status   Specimen Description WOUND BACK  Final   Special Requests NOC L5 S1 DISC SPACE  Final   Fungal Smear   Final    NO YEAST OR FUNGAL ELEMENTS SEEN Performed at [facility]    Culture   Final    CULTURE IN PROGRESS FOR FOUR WEEKS Performed at [facility]    Report Status PENDING  Incomplete  Fungus Culture with Smear     Status: None (Preliminary result)   Collection Time: 04/16/14  6:44 PM  Result Value Ref Range Status   Specimen Description WOUND BACK  Final   Special Requests NOD L5 S1 DISC SPACE  Final   Fungal Smear   Final    NO YEAST OR FUNGAL ELEMENTS SEEN Performed at [facility]    Culture   Final    CULTURE IN PROGRESS FOR FOUR WEEKS Performed at [facility]    Report Status PENDING  Incomplete  AFB culture with smear     Status: None (Preliminary result)   Collection Time: 04/16/14  6:44 PM  Result Value Ref Range Status   Specimen Description WOUND BACK  Final   Special Requests NO A L5 S1 EPIDURAL SPACE  Final   Acid Fast Smear   Final    NO ACID FAST BACILLI SEEN Performed at [facility]    Culture   Final    CULTURE WILL BE EXAMINED FOR 6 WEEKS BEFORE ISSUING A FINAL REPORT Performed at [facility]    Report Status PENDING  Incomplete  AFB culture with smear     Status: None (Preliminary result)   Collection Time: 04/16/14  6:44 PM  Result Value Ref Range Status   Specimen Description WOUND BACK  Final   Special Requests NOB EPIDURAL SPACE  Final   Acid Fast Smear   Final    NO ACID FAST BACILLI SEEN Performed at [facility]    Culture   Final    CULTURE WILL BE EXAMINED FOR 6 WEEKS BEFORE ISSUING A FINAL REPORT Performed at [facility]    Report Status PENDING  Incomplete  AFB culture with smear     Status: None (Preliminary result)   Collection Time: 04/16/14  6:44 PM  Result Value Ref Range Status    Specimen Description WOUND BACK  Final   Special Requests NOC L5 S1 DISC SPACE  Final   Acid Fast Smear   Final    NO ACID FAST BACILLI SEEN Performed at [facility]    Culture   Final    CULTURE WILL BE EXAMINED FOR 6 WEEKS BEFORE ISSUING A FINAL REPORT Performed at [facility]    Report Status PENDING  Incomplete  AFB culture with smear     Status: None (Preliminary result)   Collection Time: 04/16/14  6:44 PM  Result Value Ref Range Status   Specimen Description WOUND BACK  Final   Special Requests NOD L5 S1 DISC SPACE  Final   Acid Fast Smear   Final    NO ACID FAST BACILLI SEEN Performed at [facility]    Culture   Final    CULTURE WILL BE EXAMINED FOR 6 WEEKS BEFORE ISSUING A FINAL REPORT Performed at [facility]    Report Status PENDING  Incomplete  Fungus Culture with Smear  Status: None (Preliminary result)   Collection Time: 04/20/14  1:54 PM  Result Value Ref Range Status   Specimen Description FLUID SYNOVIAL RIGHT HIP  Final   Special Requests NONE  Final   Fungal Smear   Final    NO YEAST OR FUNGAL ELEMENTS SEEN Performed at [facility]    Culture   Final    CULTURE IN PROGRESS FOR FOUR WEEKS Performed at [facility]    Report Status PENDING  Incomplete  Body fluid culture     Status: None (Preliminary result)   Collection Time: 04/20/14  1:54 PM  Result Value Ref Range Status   Specimen Description FLUID SYNOVIAL RIGHT HIP  Final   Special Requests NONE  Final   Gram Stain PENDING  Incomplete   Culture   Final    NO GROWTH 2 DAYS Performed at [facility]    Report Status PENDING  Incomplete     Labs: Basic Metabolic Panel:  Recent Labs Lab 04/17/14 0459 04/18/14 0530 04/21/14 0609  NA 134* 133* 133*  K 3.9 3.5* 3.8  CL 102 101 102  CO2 _0 GLUCOSE 63* 75 96  BUN 5* 7 5*  CREATININE 0.50 0.58 0.46*  CALCIUM 8.8 8.6 8.1*   CBC:  Recent Labs Lab 04/17/14 0459  04/18/14 0530 04/21/14 0609  WBC 1.9* 2.1* 1.5*  HGB 7.2* 8.1* 7.3*  HCT 21.7* 24.3* 21.8*  MCV 94.8 96.0 97.3  PLT 90* 97* 79*       Signed:  Melton Alar, PA-C  [facility] 04/23/2014, 9:30 AM Attending  Patient was seen, examined,treatment plan was discussed with the Physician extender. I have directly reviewed the clinical findings, lab, imaging studies and management of this patient in detail. I have made the necessary changes to the above noted documentation, and agree with the documentation, as recorded by the Physician extender.  Orson Eva, DO [phone]

## 2014-04-24 LAB — CREATININE, SERUM: CREATININE: 0.7 mg/dL (ref 0.60–1.30)

## 2014-04-24 LAB — VANCOMYCIN, TROUGH: Vancomycin, Trough: 21 ug/mL (ref 10–20)

## 2014-04-26 LAB — VANCOMYCIN, TROUGH: Vancomycin, Trough: 16 ug/mL (ref 10–20)

## 2014-05-04 LAB — CREATININE, SERUM
Creatinine: 0.73 mg/dL (ref 0.60–1.30)
EGFR (African American): >60

## 2014-05-04 LAB — VANCOMYCIN, TROUGH: Vancomycin, Trough: 12 ug/mL (ref 10–20)

## 2014-05-08 ENCOUNTER — Encounter: Payer: Self-pay | Admitting: Internal Medicine

## 2014-05-10 ENCOUNTER — Telehealth: Payer: Self-pay | Admitting: *Deleted

## 2014-05-10 NOTE — Telephone Encounter (Signed)
Pam, pharmacist at patient's skilled nursing facility called for stop dates on IV antibiotics. Per Dr. Tommy Medal this should be continued until patient's upcoming appointment on 05/21/14. Myrtis Hopping

## 2014-05-11 LAB — VANCOMYCIN, TROUGH: VANCOMYCIN, TROUGH: 15 ug/mL (ref 10–20)

## 2014-05-11 LAB — CREATININE, SERUM
CREATININE: 0.77 mg/dL (ref 0.60–1.30)
EGFR (African American): >60
EGFR (Non-African Amer.): >60

## 2014-05-16 LAB — FUNGUS CULTURE W SMEAR
FUNGAL SMEAR: NONE SEEN
FUNGAL SMEAR: NONE SEEN
Fungal Smear: NONE SEEN
Fungal Smear: NONE SEEN

## 2014-05-18 LAB — FUNGUS CULTURE W SMEAR: Fungal Smear: NONE SEEN

## 2014-05-21 ENCOUNTER — Inpatient Hospital Stay: Payer: Medicaid Other | Admitting: Infectious Disease

## 2014-05-22 LAB — COMPREHENSIVE METABOLIC PANEL
ALT: 16 U/L
AST: 28 U/L (ref 15–37)
Albumin: 1.5 g/dL — ABNORMAL LOW (ref 3.4–5.0)
Alkaline Phosphatase: 124 U/L — ABNORMAL HIGH
Anion Gap: 9 (ref 7–16)
BILIRUBIN TOTAL: 1.3 mg/dL — AB (ref 0.2–1.0)
BUN: 5 mg/dL — ABNORMAL LOW (ref 7–18)
CALCIUM: 6 mg/dL — AB (ref 8.5–10.1)
CHLORIDE: 113 mmol/L — AB (ref 98–107)
CO2: 20 mmol/L — AB (ref 21–32)
Creatinine: 0.44 mg/dL — ABNORMAL LOW (ref 0.60–1.30)
Glucose: 69 mg/dL (ref 65–99)
Osmolality: 279 (ref 275–301)
POTASSIUM: 2.9 mmol/L — AB (ref 3.5–5.1)
Sodium: 142 mmol/L (ref 136–145)
Total Protein: 5.5 g/dL — ABNORMAL LOW (ref 6.4–8.2)

## 2014-05-22 LAB — CBC WITH DIFFERENTIAL/PLATELET
Basophil #: 0 10*3/uL (ref 0.0–0.1)
Basophil %: 0.5 %
Eosinophil #: 0.1 10*3/uL (ref 0.0–0.7)
Eosinophil %: 4.4 %
HCT: 26.6 % — ABNORMAL LOW (ref 40.0–52.0)
HGB: 8.8 g/dL — AB (ref 13.0–18.0)
Lymphocyte #: 0.7 10*3/uL — ABNORMAL LOW (ref 1.0–3.6)
Lymphocyte %: 28.9 %
MCH: 33.7 pg (ref 26.0–34.0)
MCHC: 32.9 g/dL (ref 32.0–36.0)
MCV: 103 fL — ABNORMAL HIGH (ref 80–100)
MONO ABS: 0.3 x10 3/mm (ref 0.2–1.0)
Monocyte %: 11.9 %
Neutrophil #: 1.3 10*3/uL — ABNORMAL LOW (ref 1.4–6.5)
Neutrophil %: 54.3 %
PLATELETS: 84 10*3/uL — AB (ref 150–440)
RBC: 2.6 10*6/uL — AB (ref 4.40–5.90)
RDW: 21.5 % — ABNORMAL HIGH (ref 11.5–14.5)
WBC: 2.5 10*3/uL — ABNORMAL LOW (ref 3.8–10.6)

## 2014-05-23 ENCOUNTER — Encounter: Payer: Self-pay | Admitting: Infectious Disease

## 2014-05-23 ENCOUNTER — Ambulatory Visit (INDEPENDENT_AMBULATORY_CARE_PROVIDER_SITE_OTHER): Payer: Medicaid Other | Admitting: Infectious Disease

## 2014-05-23 VITALS — BP 127/78 | HR 102 | Temp 99.2°F | Wt 155.4 lb

## 2014-05-23 DIAGNOSIS — M519 Unspecified thoracic, thoracolumbar and lumbosacral intervertebral disc disorder: Secondary | ICD-10-CM

## 2014-05-23 DIAGNOSIS — M869 Osteomyelitis, unspecified: Secondary | ICD-10-CM

## 2014-05-23 DIAGNOSIS — N63 Unspecified lump in unspecified breast: Secondary | ICD-10-CM

## 2014-05-23 DIAGNOSIS — Z21 Asymptomatic human immunodeficiency virus [HIV] infection status: Secondary | ICD-10-CM

## 2014-05-23 DIAGNOSIS — M4640 Discitis, unspecified, site unspecified: Secondary | ICD-10-CM

## 2014-05-23 DIAGNOSIS — B20 Human immunodeficiency virus [HIV] disease: Secondary | ICD-10-CM

## 2014-05-23 DIAGNOSIS — G062 Extradural and subdural abscess, unspecified: Secondary | ICD-10-CM

## 2014-05-23 DIAGNOSIS — K7460 Unspecified cirrhosis of liver: Secondary | ICD-10-CM

## 2014-05-23 DIAGNOSIS — C8580 Other specified types of non-Hodgkin lymphoma, unspecified site: Secondary | ICD-10-CM

## 2014-05-23 DIAGNOSIS — D61818 Other pancytopenia: Secondary | ICD-10-CM

## 2014-05-23 DIAGNOSIS — M4644 Discitis, unspecified, thoracic region: Secondary | ICD-10-CM

## 2014-05-23 DIAGNOSIS — C8510 Unspecified B-cell lymphoma, unspecified site: Secondary | ICD-10-CM

## 2014-05-23 DIAGNOSIS — M25551 Pain in right hip: Secondary | ICD-10-CM

## 2014-05-23 DIAGNOSIS — M4646 Discitis, unspecified, lumbar region: Secondary | ICD-10-CM

## 2014-05-23 DIAGNOSIS — M4626 Osteomyelitis of vertebra, lumbar region: Secondary | ICD-10-CM

## 2014-05-23 LAB — C-REACTIVE PROTEIN: CRP: 4.8 mg/dL — AB (ref <0.60)

## 2014-05-23 LAB — SEDIMENTATION RATE: Sed Rate: 76 mm/hr — ABNORMAL HIGH (ref 0–16)

## 2014-05-23 MED ORDER — FLUCONAZOLE 200 MG PO TABS: 400.0000 mg | ORAL_TABLET | Freq: Every day | ORAL | Status: AC

## 2014-05-23 NOTE — Progress Notes (Signed)
Subjective:    Patient ID: Samuel Schultz, male    DOB: 07-15-1975, 38 y.o.   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 We took him off antibiotics for a week prior to surgery and he is now sp L5-S1 microsiscectomy with nerve root discetomy and biopsies mx for culture by Dr. Saintclair Halsted. Operative cultures failed to grow any bacterial organisms or fungal cultures at 4 weeks if the cultures are still pending. He is placed on micafungin and vancomycin has remained on these for now 5 weeks.  Today he is continuing to complain of worsening pain now in his lower lumbar area as well as his hips. He also has noticed that skin nodule in his left breast that has bothered him for 6 months in which he and his guardian asked me to look at.   Review of Systems  Constitutional: Negative for fever, chills, diaphoresis, activity change, appetite change, fatigue and unexpected weight change.  HENT: Negative for congestion, rhinorrhea, sinus pressure, sneezing, sore throat and trouble swallowing.   Eyes: Negative for photophobia and visual disturbance.  Respiratory: Negative for cough, chest tightness, shortness of breath, wheezing and stridor.   Cardiovascular: Negative for chest pain, palpitations and leg swelling.  Gastrointestinal: Positive for nausea. Negative for vomiting, abdominal pain, diarrhea, constipation, blood in stool, abdominal distention and anal bleeding.  Genitourinary: Negative for dysuria, hematuria, flank pain  and difficulty urinating.  Musculoskeletal: Positive for back pain and arthralgias. Negative for myalgias, joint swelling and gait problem.  Skin: Negative for color change, pallor, rash and wound.  Neurological: Negative for dizziness, tremors, weakness and light-headedness.  Hematological: Negative for adenopathy. Does not bruise/bleed easily.  Psychiatric/Behavioral: Negative for behavioral problems, confusion, sleep disturbance, dysphoric mood, decreased concentration and agitation. The patient is nervous/anxious.        Objective:   Physical Exam  Constitutional: He is oriented to person, place, and time. He appears well-developed and well-nourished.  HENT:  Head: Normocephalic and atraumatic.  Eyes: Conjunctivae and EOM are normal.  Neck: Normal range of motion. Neck supple.  Cardiovascular: Normal rate and regular rhythm.   Pulmonary/Chest: Effort normal. No respiratory distress. He has no wheezes.    Abdominal: Soft. He exhibits no distension.  Musculoskeletal: Normal range of motion. He exhibits no edema or tenderness.       Arms: Neurological: He is alert and oriented to person, place, and time.  Skin: Skin is warm and dry. No rash noted. No erythema. No pallor.  Psychiatric: His mood appears anxious. He exhibits a depressed mood.          Assessment & Plan:   Diskitis: Change him over to high-dose fluconazole continue vancomycin due to his worsening pain we'll obtain an MRI of his thoracic and lumbar spine.I spent greater than 40 minutes with the patient including greater than 50% of time in face to face counsel of the patient and in coordination of their care.  Hip pain: Suggestion of bursitis on MRI but aspirate of the joint was not convincing for infection will  repeat MRI of both hips  Subcutaneous nodule left breast will obtain ultrasound left breast and left chest  HIV continue current regimen recheck viral load and CD4 count today.

## 2014-05-23 NOTE — Patient Instructions (Signed)
I am stopping the mycafungin and starting you on oral fluconazole TWO 200mg  tablets daily  I would like the vancomycin to continue for AT LEAST another 3 weeks  I would like to check MRI of your Thoracic and LUmbar spine as well of BOTh of your hips  We will check blood work today as well  You should also be seen in followup by Dr. Saintclair Halsted esp if the MRI of the T-L spine is now worse

## 2014-05-24 LAB — T-HELPER CELL (CD4) - (RCID CLINIC ONLY)
CD4 T CELL HELPER: 7 % — AB (ref 33–55)
CD4 T Cell Abs: 50 /uL — ABNORMAL LOW (ref 400–2700)

## 2014-05-24 LAB — HIV-1 RNA QUANT-NO REFLEX-BLD
HIV 1 RNA Quant: <20 copies/mL (ref <20)
HIV-1 RNA Quant, Log: <1.3 {Log} (ref <1.30)

## 2014-05-28 LAB — VANCOMYCIN, TROUGH: Vancomycin, Trough: 22 ug/mL (ref 10–20)

## 2014-05-29 LAB — AFB CULTURE WITH SMEAR (NOT AT ARMC)
ACID FAST SMEAR: NONE SEEN
ACID FAST SMEAR: NONE SEEN
Acid Fast Smear: NONE SEEN
Acid Fast Smear: NONE SEEN

## 2014-05-29 LAB — CBC WITH DIFFERENTIAL/PLATELET
BASOS PCT: 0.5 %
Basophil #: 0 10*3/uL (ref 0.0–0.1)
Eosinophil #: 0.1 10*3/uL (ref 0.0–0.7)
Eosinophil %: 3.4 %
HCT: 31.4 % — AB (ref 40.0–52.0)
HGB: 10.3 g/dL — ABNORMAL LOW (ref 13.0–18.0)
LYMPHS ABS: 0.9 10*3/uL — AB (ref 1.0–3.6)
LYMPHS PCT: 24.5 %
MCH: 34.3 pg — ABNORMAL HIGH (ref 26.0–34.0)
MCHC: 33 g/dL (ref 32.0–36.0)
MCV: 104 fL — ABNORMAL HIGH (ref 80–100)
MONO ABS: 0.4 x10 3/mm (ref 0.2–1.0)
MONOS PCT: 11.7 %
Neutrophil #: 2.1 10*3/uL (ref 1.4–6.5)
Neutrophil %: 59.9 %
Platelet: 102 10*3/uL — ABNORMAL LOW (ref 150–440)
RBC: 3.02 10*6/uL — ABNORMAL LOW (ref 4.40–5.90)
RDW: 21.2 % — ABNORMAL HIGH (ref 11.5–14.5)
WBC: 3.6 10*3/uL — ABNORMAL LOW (ref 3.8–10.6)

## 2014-05-29 LAB — COMPREHENSIVE METABOLIC PANEL
ALBUMIN: 2.2 g/dL — AB (ref 3.4–5.0)
ALK PHOS: 182 U/L — AB
Anion Gap: 5 — ABNORMAL LOW (ref 7–16)
BILIRUBIN TOTAL: 2.1 mg/dL — AB (ref 0.2–1.0)
BUN: 7 mg/dL (ref 7–18)
CHLORIDE: 104 mmol/L (ref 98–107)
CREATININE: 0.85 mg/dL (ref 0.60–1.30)
Calcium, Total: 8.1 mg/dL — ABNORMAL LOW (ref 8.5–10.1)
Co2: 26 mmol/L (ref 21–32)
EGFR (African American): >60
EGFR (Non-African Amer.): >60
GLUCOSE: 105 mg/dL — AB (ref 65–99)
Osmolality: 268 (ref 275–301)
Potassium: 3.9 mmol/L (ref 3.5–5.1)
SGOT(AST): 58 U/L — ABNORMAL HIGH (ref 15–37)
SGPT (ALT): 30 U/L
Sodium: 135 mmol/L — ABNORMAL LOW (ref 136–145)
Total Protein: 7.8 g/dL (ref 6.4–8.2)

## 2014-05-30 LAB — VANCOMYCIN, TROUGH: Vancomycin, Trough: 12 ug/mL (ref 10–20)

## 2014-05-31 LAB — VANCOMYCIN, TROUGH: VANCOMYCIN, TROUGH: 17 ug/mL (ref 10–20)

## 2014-06-04 LAB — COMPREHENSIVE METABOLIC PANEL
ALT: 27 U/L
Albumin: 1.8 g/dL — ABNORMAL LOW (ref 3.4–5.0)
Alkaline Phosphatase: 165 U/L — ABNORMAL HIGH
Anion Gap: 6 — ABNORMAL LOW (ref 7–16)
BUN: 7 mg/dL (ref 7–18)
Bilirubin,Total: 2.4 mg/dL — ABNORMAL HIGH (ref 0.2–1.0)
CALCIUM: 7.5 mg/dL — AB (ref 8.5–10.1)
CHLORIDE: 104 mmol/L (ref 98–107)
CO2: 24 mmol/L (ref 21–32)
Creatinine: 0.92 mg/dL (ref 0.60–1.30)
EGFR (Non-African Amer.): >60
GLUCOSE: 130 mg/dL — AB (ref 65–99)
Osmolality: 268 (ref 275–301)
Potassium: 4 mmol/L (ref 3.5–5.1)
SGOT(AST): 59 U/L — ABNORMAL HIGH (ref 15–37)
Sodium: 134 mmol/L — ABNORMAL LOW (ref 136–145)
Total Protein: 7.2 g/dL (ref 6.4–8.2)

## 2014-06-04 LAB — CBC WITH DIFFERENTIAL/PLATELET
BASOS ABS: 0 10*3/uL (ref 0.0–0.1)
BASOS PCT: 0.4 %
Eosinophil #: 0.1 10*3/uL (ref 0.0–0.7)
Eosinophil %: 1.9 %
HCT: 27.4 % — AB (ref 40.0–52.0)
HGB: 9 g/dL — ABNORMAL LOW (ref 13.0–18.0)
LYMPHS PCT: 18.9 %
Lymphocyte #: 0.7 10*3/uL — ABNORMAL LOW (ref 1.0–3.6)
MCH: 34.2 pg — ABNORMAL HIGH (ref 26.0–34.0)
MCHC: 33 g/dL (ref 32.0–36.0)
MCV: 104 fL — AB (ref 80–100)
Monocyte #: 0.4 x10 3/mm (ref 0.2–1.0)
Monocyte %: 10.9 %
NEUTROS ABS: 2.4 10*3/uL (ref 1.4–6.5)
NEUTROS PCT: 67.9 %
Platelet: 78 10*3/uL — ABNORMAL LOW (ref 150–440)
RBC: 2.64 10*6/uL — ABNORMAL LOW (ref 4.40–5.90)
RDW: 20 % — ABNORMAL HIGH (ref 11.5–14.5)
WBC: 3.6 10*3/uL — AB (ref 3.8–10.6)

## 2014-06-07 LAB — VANCOMYCIN, TROUGH: VANCOMYCIN, TROUGH: 14 ug/mL (ref 10–20)

## 2014-06-08 ENCOUNTER — Encounter: Payer: Self-pay | Admitting: Internal Medicine

## 2014-06-11 ENCOUNTER — Telehealth: Payer: Self-pay | Admitting: Licensed Clinical Social Worker

## 2014-06-11 ENCOUNTER — Encounter (HOSPITAL_COMMUNITY): Payer: Self-pay | Admitting: Internal Medicine

## 2014-06-11 ENCOUNTER — Inpatient Hospital Stay (HOSPITAL_COMMUNITY)
Admission: RE | Admit: 2014-06-11 | Discharge: 2014-06-21 | DRG: 871 | Disposition: A | Discharge status: Skilled Nursing Facility | Payer: Medicaid Other | Source: Other Acute Inpatient Hospital | Attending: Internal Medicine | Admitting: Internal Medicine

## 2014-06-11 ENCOUNTER — Emergency Department: Payer: Self-pay | Admitting: Emergency Medicine

## 2014-06-11 DIAGNOSIS — B952 Enterococcus as the cause of diseases classified elsewhere: Secondary | ICD-10-CM | POA: Insufficient documentation

## 2014-06-11 DIAGNOSIS — R6251 Failure to thrive (child): Secondary | ICD-10-CM | POA: Insufficient documentation

## 2014-06-11 DIAGNOSIS — Y95 Nosocomial condition: Secondary | ICD-10-CM | POA: Diagnosis not present

## 2014-06-11 DIAGNOSIS — F419 Anxiety disorder, unspecified: Secondary | ICD-10-CM | POA: Diagnosis present

## 2014-06-11 DIAGNOSIS — R627 Adult failure to thrive: Secondary | ICD-10-CM | POA: Insufficient documentation

## 2014-06-11 DIAGNOSIS — E43 Unspecified severe protein-calorie malnutrition: Secondary | ICD-10-CM | POA: Diagnosis not present

## 2014-06-11 DIAGNOSIS — M4646 Discitis, unspecified, lumbar region: Secondary | ICD-10-CM | POA: Insufficient documentation

## 2014-06-11 DIAGNOSIS — M659 Synovitis and tenosynovitis, unspecified: Secondary | ICD-10-CM | POA: Diagnosis not present

## 2014-06-11 DIAGNOSIS — E871 Hypo-osmolality and hyponatremia: Secondary | ICD-10-CM | POA: Diagnosis not present

## 2014-06-11 DIAGNOSIS — Z8572 Personal history of non-Hodgkin lymphomas: Secondary | ICD-10-CM | POA: Diagnosis not present

## 2014-06-11 DIAGNOSIS — M4649 Discitis, unspecified, multiple sites in spine: Secondary | ICD-10-CM | POA: Diagnosis present

## 2014-06-11 DIAGNOSIS — A419 Sepsis, unspecified organism: Principal | ICD-10-CM | POA: Diagnosis present

## 2014-06-11 DIAGNOSIS — F4310 Post-traumatic stress disorder, unspecified: Secondary | ICD-10-CM | POA: Diagnosis present

## 2014-06-11 DIAGNOSIS — G8929 Other chronic pain: Secondary | ICD-10-CM | POA: Diagnosis present

## 2014-06-11 DIAGNOSIS — E876 Hypokalemia: Secondary | ICD-10-CM | POA: Diagnosis not present

## 2014-06-11 DIAGNOSIS — Q782 Osteopetrosis: Secondary | ICD-10-CM | POA: Insufficient documentation

## 2014-06-11 DIAGNOSIS — Z803 Family history of malignant neoplasm of breast: Secondary | ICD-10-CM

## 2014-06-11 DIAGNOSIS — L0291 Cutaneous abscess, unspecified: Secondary | ICD-10-CM

## 2014-06-11 DIAGNOSIS — G9340 Encephalopathy, unspecified: Secondary | ICD-10-CM | POA: Diagnosis present

## 2014-06-11 DIAGNOSIS — R509 Fever, unspecified: Secondary | ICD-10-CM | POA: Insufficient documentation

## 2014-06-11 DIAGNOSIS — M4644 Discitis, unspecified, thoracic region: Secondary | ICD-10-CM | POA: Insufficient documentation

## 2014-06-11 DIAGNOSIS — Z800 Family history of malignant neoplasm of digestive organs: Secondary | ICD-10-CM | POA: Diagnosis not present

## 2014-06-11 DIAGNOSIS — T80219A Unspecified infection due to central venous catheter, initial encounter: Secondary | ICD-10-CM | POA: Diagnosis not present

## 2014-06-11 DIAGNOSIS — F32A Depression, unspecified: Secondary | ICD-10-CM | POA: Insufficient documentation

## 2014-06-11 DIAGNOSIS — R339 Retention of urine, unspecified: Secondary | ICD-10-CM | POA: Diagnosis not present

## 2014-06-11 DIAGNOSIS — M869 Osteomyelitis, unspecified: Secondary | ICD-10-CM | POA: Insufficient documentation

## 2014-06-11 DIAGNOSIS — K7460 Unspecified cirrhosis of liver: Secondary | ICD-10-CM | POA: Diagnosis not present

## 2014-06-11 DIAGNOSIS — J189 Pneumonia, unspecified organism: Secondary | ICD-10-CM | POA: Diagnosis not present

## 2014-06-11 DIAGNOSIS — E860 Dehydration: Secondary | ICD-10-CM | POA: Diagnosis present

## 2014-06-11 DIAGNOSIS — Y848 Other medical procedures as the cause of abnormal reaction of the patient, or of later complication, without mention of misadventure at the time of the procedure: Secondary | ICD-10-CM | POA: Diagnosis not present

## 2014-06-11 DIAGNOSIS — R188 Other ascites: Secondary | ICD-10-CM | POA: Insufficient documentation

## 2014-06-11 DIAGNOSIS — D61818 Other pancytopenia: Secondary | ICD-10-CM

## 2014-06-11 DIAGNOSIS — R0902 Hypoxemia: Secondary | ICD-10-CM | POA: Diagnosis not present

## 2014-06-11 DIAGNOSIS — M009 Pyogenic arthritis, unspecified: Secondary | ICD-10-CM | POA: Diagnosis not present

## 2014-06-11 DIAGNOSIS — Z6824 Body mass index (BMI) 24.0-24.9, adult: Secondary | ICD-10-CM | POA: Diagnosis not present

## 2014-06-11 DIAGNOSIS — R5383 Other fatigue: Secondary | ICD-10-CM | POA: Diagnosis present

## 2014-06-11 DIAGNOSIS — Z21 Asymptomatic human immunodeficiency virus [HIV] infection status: Secondary | ICD-10-CM | POA: Diagnosis not present

## 2014-06-11 DIAGNOSIS — F329 Major depressive disorder, single episode, unspecified: Secondary | ICD-10-CM | POA: Diagnosis not present

## 2014-06-11 DIAGNOSIS — B379 Candidiasis, unspecified: Secondary | ICD-10-CM | POA: Insufficient documentation

## 2014-06-11 DIAGNOSIS — Z1621 Resistance to vancomycin: Secondary | ICD-10-CM | POA: Insufficient documentation

## 2014-06-11 DIAGNOSIS — R7881 Bacteremia: Secondary | ICD-10-CM

## 2014-06-11 DIAGNOSIS — A491 Streptococcal infection, unspecified site: Secondary | ICD-10-CM | POA: Insufficient documentation

## 2014-06-11 DIAGNOSIS — A4189 Other specified sepsis: Secondary | ICD-10-CM | POA: Insufficient documentation

## 2014-06-11 DIAGNOSIS — B20 Human immunodeficiency virus [HIV] disease: Secondary | ICD-10-CM | POA: Diagnosis present

## 2014-06-11 LAB — URINALYSIS, COMPLETE
BLOOD: NEGATIVE
Bilirubin,UR: NEGATIVE
Glucose,UR: NEGATIVE mg/dL (ref 0–75)
Ketone: NEGATIVE
LEUKOCYTE ESTERASE: NEGATIVE
NITRITE: NEGATIVE
Ph: 7 (ref 4.5–8.0)
Protein: NEGATIVE
RBC,UR: NONE SEEN /HPF (ref 0–5)
Specific Gravity: 1.008 (ref 1.003–1.030)
Squamous Epithelial: <1

## 2014-06-11 LAB — CBC WITH DIFFERENTIAL/PLATELET
Basophil #: 0 x10 3/mm 3 (ref 0.0–0.1)
Basophil %: 0.8 %
Basophils Absolute: 0 10*3/uL (ref 0.0–0.1)
Basophils Relative: 0 % (ref 0–1)
EOS PCT: 1 % (ref 0–5)
Eosinophil #: 0 x10 3/mm 3 (ref 0.0–0.7)
Eosinophil %: 1.1 %
Eosinophils Absolute: 0 10*3/uL (ref 0.0–0.7)
HCT: 23.6 % — ABNORMAL LOW (ref 40.0–52.0)
HEMATOCRIT: 23.5 % — AB (ref 39.0–52.0)
HGB: 7.9 g/dL — ABNORMAL LOW (ref 13.0–18.0)
Hemoglobin: 7.9 g/dL — ABNORMAL LOW (ref 13.0–17.0)
LYMPHS ABS: 0.3 10*3/uL — AB (ref 0.7–4.0)
LYMPHS PCT: 12 % (ref 12–46)
Lymphocyte %: 24.6 %
Lymphs Abs: 0.7 x10 3/mm 3 — ABNORMAL LOW (ref 1.0–3.6)
MCH: 33.9 pg (ref 26.0–34.0)
MCH: 33.2 pg (ref 26.0–34.0)
MCHC: 33.3 g/dL (ref 32.0–36.0)
MCHC: 33.6 g/dL (ref 30.0–36.0)
MCV: 102 fL — ABNORMAL HIGH (ref 80–100)
MCV: 98.7 fL (ref 78.0–100.0)
MONO ABS: 0.3 10*3/uL (ref 0.1–1.0)
Monocyte #: 0.3 x10 3/mm (ref 0.2–1.0)
Monocyte %: 11.6 %
Monocytes Relative: 11 % (ref 3–12)
Neutro Abs: 2 10*3/uL (ref 1.7–7.7)
Neutrophil #: 1.7 x10 3/mm 3 (ref 1.4–6.5)
Neutrophil %: 61.9 %
Neutrophils Relative %: 75 % (ref 43–77)
Platelet: 71 x10 3/mm 3 — ABNORMAL LOW (ref 150–440)
Platelets: 63 10*3/uL — ABNORMAL LOW (ref 150–400)
RBC: 2.31 x10 6/mm 3 — ABNORMAL LOW (ref 4.40–5.90)
RBC: 2.38 MIL/uL — AB (ref 4.22–5.81)
RDW: 19.9 % — ABNORMAL HIGH (ref 11.5–14.5)
RDW: 19.2 % — ABNORMAL HIGH (ref 11.5–15.5)
WBC: 2.8 x10 3/mm 3 — ABNORMAL LOW (ref 3.8–10.6)
WBC: 2.6 10*3/uL — ABNORMAL LOW (ref 4.0–10.5)

## 2014-06-11 LAB — COMPREHENSIVE METABOLIC PANEL
ALBUMIN: 1.6 g/dL — AB (ref 3.4–5.0)
ALBUMIN: 1.6 g/dL — AB (ref 3.5–5.2)
ALT: 26 U/L (ref 0–53)
ANION GAP: 4 — AB (ref 7–16)
ANION GAP: 2 — AB (ref 5–15)
AST: 61 U/L — AB (ref 0–37)
Alkaline Phosphatase: 151 U/L — ABNORMAL HIGH
Alkaline Phosphatase: 125 U/L — ABNORMAL HIGH (ref 39–117)
BILIRUBIN TOTAL: 2.1 mg/dL — AB (ref 0.2–1.0)
BILIRUBIN TOTAL: 2.4 mg/dL — AB (ref 0.3–1.2)
BUN: 6 mg/dL — AB (ref 7–18)
BUN: 6 mg/dL (ref 6–23)
CHLORIDE: 104 mmol/L (ref 98–107)
CHLORIDE: 106 meq/L (ref 96–112)
CO2: 20 mmol/L (ref 19–32)
CREATININE: 0.55 mg/dL (ref 0.50–1.35)
Calcium, Total: 7.2 mg/dL — ABNORMAL LOW (ref 8.5–10.1)
Calcium: 7.4 mg/dL — ABNORMAL LOW (ref 8.4–10.5)
Co2: 25 mmol/L (ref 21–32)
Creatinine: 0.85 mg/dL (ref 0.60–1.30)
EGFR (Non-African Amer.): >60
GFR calc Af Amer: >90 mL/min (ref >90)
GFR calc non Af Amer: >90 mL/min (ref >90)
Glucose, Bld: 101 mg/dL — ABNORMAL HIGH (ref 70–99)
Glucose: 83 mg/dL (ref 65–99)
Osmolality: 263 (ref 275–301)
POTASSIUM: 3.9 mmol/L (ref 3.5–5.1)
Potassium: 3.7 mmol/L (ref 3.5–5.1)
SGOT(AST): 61 U/L — ABNORMAL HIGH (ref 15–37)
SGPT (ALT): 29 U/L
Sodium: 133 mmol/L — ABNORMAL LOW (ref 136–145)
Sodium: 128 mmol/L — ABNORMAL LOW (ref 135–145)
TOTAL PROTEIN: 6.1 g/dL (ref 6.0–8.3)
Total Protein: 6.2 g/dL — ABNORMAL LOW (ref 6.4–8.2)

## 2014-06-11 LAB — LACTIC ACID, PLASMA: Lactic Acid, Venous: 1.6 mmol/L (ref 0.5–2.2)

## 2014-06-11 LAB — D-DIMER, QUANTITATIVE (NOT AT ARMC): D DIMER QUANT: 4.13 ug{FEU}/mL — AB (ref 0.00–0.48)

## 2014-06-11 LAB — SEDIMENTATION RATE: Erythrocyte Sed Rate: 73 mm/hr — ABNORMAL HIGH (ref 0–15)

## 2014-06-11 LAB — MRSA PCR SCREENING: MRSA by PCR: NEGATIVE

## 2014-06-11 LAB — AMMONIA: AMMONIA: 77 umol/L — AB (ref 11–32)

## 2014-06-11 MED ORDER — EMTRICITABINE-TENOFOVIR DF 200-300 MG PO TABS
1.0000 | ORAL_TABLET | Freq: Every day | ORAL | Status: DC
  Administered 2014-06-12 – 2014-06-20 (×8): 1 via ORAL
  Filled 2014-06-11 (×10): qty 1

## 2014-06-11 MED ORDER — SUCRALFATE 1 G PO TABS
1.0000 g | ORAL_TABLET | Freq: Three times a day (TID) | ORAL | Status: DC | PRN
  Administered 2014-06-18: 1 g via ORAL
  Filled 2014-06-11 (×4): qty 1

## 2014-06-11 MED ORDER — METHOCARBAMOL 500 MG PO TABS
500.0000 mg | ORAL_TABLET | Freq: Four times a day (QID) | ORAL | Status: DC | PRN
  Administered 2014-06-12 – 2014-06-18 (×15): 500 mg via ORAL
  Filled 2014-06-11 (×15): qty 1

## 2014-06-11 MED ORDER — PANTOPRAZOLE SODIUM 40 MG PO TBEC
40.0000 mg | DELAYED_RELEASE_TABLET | Freq: Every day | ORAL | Status: DC
  Administered 2014-06-12 – 2014-06-18 (×7): 40 mg via ORAL
  Filled 2014-06-11 (×8): qty 1

## 2014-06-11 MED ORDER — MORPHINE SULFATE ER 15 MG PO TBCR
60.0000 mg | EXTENDED_RELEASE_TABLET | Freq: Two times a day (BID) | ORAL | Status: DC
  Administered 2014-06-12 – 2014-06-20 (×14): 60 mg via ORAL
  Filled 2014-06-11 (×17): qty 4

## 2014-06-11 MED ORDER — ENSURE COMPLETE PO LIQD
237.0000 mL | Freq: Two times a day (BID) | ORAL | Status: DC
  Administered 2014-06-12: 237 mL via ORAL

## 2014-06-11 MED ORDER — BIOTENE DRY MOUTH MT LIQD: 15.0000 mL | OROMUCOSAL | Status: DC | PRN

## 2014-06-11 MED ORDER — CETYLPYRIDINIUM CHLORIDE 0.05 % MT LIQD
7.0000 mL | Freq: Two times a day (BID) | OROMUCOSAL | Status: DC
  Administered 2014-06-13 – 2014-06-18 (×7): 7 mL via OROMUCOSAL

## 2014-06-11 MED ORDER — ALPRAZOLAM 0.25 MG PO TABS
0.2500 mg | ORAL_TABLET | Freq: Two times a day (BID) | ORAL | Status: DC | PRN
  Administered 2014-06-12 – 2014-06-20 (×4): 0.25 mg via ORAL
  Filled 2014-06-11 (×5): qty 1

## 2014-06-11 MED ORDER — ONDANSETRON HCL 4 MG PO TABS: 4.0000 mg | ORAL_TABLET | Freq: Four times a day (QID) | ORAL | Status: DC | PRN

## 2014-06-11 MED ORDER — RITONAVIR 100 MG PO CAPS
100.0000 mg | ORAL_CAPSULE | Freq: Every day | ORAL | Status: DC
  Administered 2014-06-12 – 2014-06-21 (×8): 100 mg via ORAL
  Filled 2014-06-11 (×12): qty 1

## 2014-06-11 MED ORDER — VANCOMYCIN HCL 10 G IV SOLR: 1250.0000 mg | Freq: Three times a day (TID) | INTRAVENOUS | Status: DC

## 2014-06-11 MED ORDER — DARUNAVIR ETHANOLATE 800 MG PO TABS
800.0000 mg | ORAL_TABLET | Freq: Every day | ORAL | Status: DC
  Administered 2014-06-12 – 2014-06-18 (×7): 800 mg via ORAL
  Filled 2014-06-11 (×9): qty 1

## 2014-06-11 MED ORDER — MORPHINE SULFATE 15 MG PO TABS
15.0000 mg | ORAL_TABLET | ORAL | Status: DC | PRN
Start: 2014-06-11 — End: 2014-06-20
  Administered 2014-06-12 – 2014-06-17 (×8): 15 mg via ORAL
  Filled 2014-06-11 (×10): qty 1

## 2014-06-11 MED ORDER — FERROUS SULFATE 325 (65 FE) MG PO TABS
325.0000 mg | ORAL_TABLET | Freq: Two times a day (BID) | ORAL | Status: DC
  Administered 2014-06-12 – 2014-06-21 (×15): 325 mg via ORAL
  Filled 2014-06-11 (×22): qty 1

## 2014-06-11 MED ORDER — SENNOSIDES-DOCUSATE SODIUM 8.6-50 MG PO TABS
2.0000 | ORAL_TABLET | Freq: Every day | ORAL | Status: DC
  Administered 2014-06-16: 2 via ORAL
  Filled 2014-06-11 (×7): qty 2

## 2014-06-11 MED ORDER — ONDANSETRON HCL 4 MG/2ML IJ SOLN
4.0000 mg | Freq: Four times a day (QID) | INTRAMUSCULAR | Status: DC | PRN
  Administered 2014-06-12 – 2014-06-19 (×6): 4 mg via INTRAVENOUS
  Filled 2014-06-11 (×6): qty 2

## 2014-06-11 MED ORDER — DEXTROSE 5 % IV SOLN
1.0000 g | Freq: Three times a day (TID) | INTRAVENOUS | Status: DC
  Administered 2014-06-11 – 2014-06-13 (×6): 1 g via INTRAVENOUS
  Filled 2014-06-11 (×8): qty 1

## 2014-06-11 MED ORDER — AZITHROMYCIN 600 MG PO TABS
1200.0000 mg | ORAL_TABLET | ORAL | Status: DC
  Filled 2014-06-11 (×2): qty 2

## 2014-06-11 MED ORDER — ATOVAQUONE 750 MG/5ML PO SUSP
750.0000 mg | Freq: Every day | ORAL | Status: DC
  Filled 2014-06-11 (×7): qty 5

## 2014-06-11 MED ORDER — DOCUSATE SODIUM 100 MG PO CAPS
100.0000 mg | ORAL_CAPSULE | Freq: Two times a day (BID) | ORAL | Status: DC
  Administered 2014-06-12 – 2014-06-17 (×5): 100 mg via ORAL
  Filled 2014-06-11 (×15): qty 1

## 2014-06-11 MED ORDER — ACETAMINOPHEN 650 MG RE SUPP: 650.0000 mg | Freq: Four times a day (QID) | RECTAL | Status: DC | PRN

## 2014-06-11 MED ORDER — SODIUM CHLORIDE 0.9 % IV SOLN
INTRAVENOUS | Status: AC
  Administered 2014-06-11: 22:00:00 via INTRAVENOUS

## 2014-06-11 MED ORDER — BUPROPION HCL ER (XL) 300 MG PO TB24
300.0000 mg | ORAL_TABLET | Freq: Every day | ORAL | Status: DC
  Administered 2014-06-12 – 2014-06-18 (×7): 300 mg via ORAL
  Filled 2014-06-11 (×10): qty 1

## 2014-06-11 MED ORDER — POLYETHYLENE GLYCOL 3350 17 G PO PACK
17.0000 g | PACK | Freq: Every day | ORAL | Status: DC
  Administered 2014-06-16 – 2014-06-17 (×2): 17 g via ORAL
  Filled 2014-06-11 (×9): qty 1

## 2014-06-11 MED ORDER — CLONAZEPAM 1 MG PO TABS
1.0000 mg | ORAL_TABLET | Freq: Every evening | ORAL | Status: DC | PRN
  Administered 2014-06-12 – 2014-06-18 (×6): 1 mg via ORAL
  Filled 2014-06-11 (×7): qty 1

## 2014-06-11 MED ORDER — MENTHOL 3 MG MT LOZG
1.0000 | LOZENGE | OROMUCOSAL | Status: DC | PRN
  Filled 2014-06-11: qty 9

## 2014-06-11 MED ORDER — FLUCONAZOLE 200 MG PO TABS
400.0000 mg | ORAL_TABLET | Freq: Every day | ORAL | Status: DC
  Administered 2014-06-12 – 2014-06-18 (×7): 400 mg via ORAL
  Filled 2014-06-11 (×9): qty 2

## 2014-06-11 MED ORDER — VANCOMYCIN HCL IN DEXTROSE 750-5 MG/150ML-% IV SOLN
750.0000 mg | Freq: Three times a day (TID) | INTRAVENOUS | Status: DC
  Administered 2014-06-12 – 2014-06-13 (×5): 750 mg via INTRAVENOUS
  Filled 2014-06-11 (×8): qty 150

## 2014-06-11 MED ORDER — ACETAMINOPHEN 325 MG PO TABS
650.0000 mg | ORAL_TABLET | Freq: Four times a day (QID) | ORAL | Status: DC | PRN
  Filled 2014-06-11: qty 2

## 2014-06-11 NOTE — Telephone Encounter (Signed)
I called to give nursing facility appointment for MRI scheduled for Monday at [facility] when RN informed me that patient was having fevers, worsening pain, and decreased oxygen. Facility wanted to have patient sent by EMS to [facility] but patient refused due to patient's physician at [facility]. Spoke with Dr. Tommy Medal he advised that it would be better for him to be at [facility] so he could see him and order appropriate tests, but if not patient just needs to be sent to the nearest hospital. Relayed the message to the Nurse Tiffany and she called for non emergent EMS to have him transported to [facility], they stated that they could not promise but they would try to take him to [facility].

## 2014-06-11 NOTE — H&P (Signed)
[facility] History and Physical  Governor Matos LPF:[id number] DOB: 15-Jul-1975 DOA: 06/11/2014  Referring physician: Patient was transferred from [facility]. PCP: [name], MD   Chief Complaint: Lethargy.  HPI: Samuel Schultz is a 39 y.o. male with history of AIDS, cirrhosis of the liver, history of lymphoma and pancytopenia was brought to the ER at [facility] from rehabilitation after patient was found to be increasingly lethargic. Patient also was found to be hypoxic and mildly febrile in the ER. Chest x-ray did not show any definite infiltrates. Patient has chronic indwelling PICC line. Patient was complaining of increasing back pain and MRI shows worsening discitis. Patient has been transferred to [facility] for further management. On exam patient is lethargic but answering questions appropriately. Denies any incontinence of urine or bowels. Denies any chest pain or shortness of breath. Patient's blood pressure is around 90 systolic and not tachycardic. Patient has been admitted for further management of mental status changes which could be from possible pneumonia or PICC line infection.   Review of Systems: As presented in the history of presenting illness, rest negative.  Past Medical History  Diagnosis Date  . HIV disease   . Cirrhosis   . PTSD (post-traumatic stress disorder)   . Depressive disorder   . Cancer   . Lymphoma   . Anemia    Past Surgical History  Procedure Laterality Date  . Cholecystectomy    . Appendectomy    . Tee without cardioversion N/A 11/13/2013    Procedure: TRANSESOPHAGEAL ECHOCARDIOGRAM (TEE);  Surgeon: Dorothy Spark, MD;  Location: [facility];  Service: Cardiovascular;  Laterality: N/A;  . Infection of spine    . Lumbar laminectomy/decompression microdiscectomy Left 04/16/2014    Procedure: Left Lumbar five-Sacral one laminectomy decompression and discectomy ;  Surgeon: Elaina Hoops, MD;  Location: [facility];  Service: Neurosurgery;  Laterality: Left;   Social History:  reports that he has never smoked. He has never used smokeless tobacco. He reports that he does not drink alcohol or use illicit drugs. Where does patient live nursing home. Can patient participate in ADLs? No.  Allergies  Allergen Reactions  . Codeine Anaphylaxis, Hives, Nausea And Vomiting, Nausea Only and Swelling  . Oxycodone Other (See Comments)    Other reaction(s): Dizziness Lightheaded, nausea    Family History:  Family History  Problem Relation Age of Onset  . Cancer Maternal Grandmother     breast ca  . Cancer Maternal Grandfather     colon ca      Prior to Admission medications   Medication Sig Start Date End Date Taking? Authorizing Provider  acetaminophen (TYLENOL) 325 MG tablet Take 2 tablets (650 mg total) by mouth every 4 (four) hours as needed (temp > 100.5). 04/23/14   Bobby Rumpf York, PA-C  ALPRAZolam (XANAX) 0.25 MG tablet Take 1 tablet (0.25 mg total) by mouth 2 (two) times daily as needed for anxiety. 04/23/14   Melton Alar, PA-C  antiseptic oral rinse (BIOTENE) LIQD 15 mLs by Mouth Rinse route as needed for dry mouth. Patient not taking: Reported on 05/23/2014 04/23/14   Bobby Rumpf York, PA-C  atovaquone [facility]) 750 MG/5ML suspension Take 750 mg by mouth daily.    Historical Provider, MD  azithromycin (ZITHROMAX) 600 MG tablet Take 1,200 mg by mouth once a week. saturday    Historical Provider, MD  buPROPion (WELLBUTRIN XL) 300 MG 24 hr tablet Take 300 mg by mouth daily.  Historical Provider, MD  clonazePAM (KLONOPIN) 1 MG tablet Take 1 tablet (1 mg total) by mouth at bedtime. 04/23/14   Melton Alar, PA-C  Darunavir Ethanolate (PREZISTA) 800 MG tablet Take 1 tablet (800 mg total) by mouth daily. 01/01/14   Truman Hayward, MD  docusate sodium 100 MG CAPS Take 100 mg by mouth 2 (two) times daily. 04/23/14   Melton Alar, PA-C  emtricitabine-tenofovir (TRUVADA) 200-300 MG per  tablet Take 1 tablet by mouth at bedtime. 01/01/14   Truman Hayward, MD  feeding supplement, ENSURE COMPLETE, (ENSURE COMPLETE) LIQD Take 237 mLs by mouth 2 (two) times daily between meals. 04/23/14   Melton Alar, PA-C  ferrous sulfate 325 (65 FE) MG tablet Take 1 tablet (325 mg total) by mouth 2 (two) times daily with a meal. 02/05/14   Kelvin Cellar, MD  fluconazole (DIFLUCAN) 200 MG tablet Take 2 tablets (400 mg total) by mouth daily. 05/23/14   Truman Hayward, MD  gabapentin (NEURONTIN) 400 MG capsule Take 400 mg by mouth 3 (three) times daily.    Historical Provider, MD  menthol-cetylpyridinium (CEPACOL) 3 MG lozenge Take 1 lozenge (3 mg total) by mouth as needed for sore throat (sore throat). 04/23/14   Bobby Rumpf York, PA-C  methocarbamol (ROBAXIN) 500 MG tablet Take 1 tablet (500 mg total) by mouth every 6 (six) hours as needed for muscle spasms. 02/05/14   Kelvin Cellar, MD  morphine (MS CONTIN) 60 MG 12 hr tablet Take 1 tablet (60 mg total) by mouth every 12 (twelve) hours. 04/23/14   Melton Alar, PA-C  morphine (MSIR) 15 MG tablet Take 1 tablet (15 mg total) by mouth every 4 (four) hours as needed for severe pain. 04/23/14   Bobby Rumpf York, PA-C  omeprazole (PRILOSEC) 20 MG capsule Take 20 mg by mouth 2 (two) times daily.    Historical Provider, MD  polyethylene glycol (MIRALAX / GLYCOLAX) packet Take 17 g by mouth daily. 03/29/14   Shanker Kristeen Mans, MD  promethazine (PHENERGAN) 12.5 MG tablet Take 1 tablet (12.5 mg total) by mouth every 6 (six) hours as needed for nausea or vomiting. 02/05/14   Kelvin Cellar, MD  ritonavir (NORVIR) 100 MG capsule Take 1 capsule (100 mg total) by mouth daily with breakfast. With Prezista 01/01/14   Truman Hayward, MD  senna-docusate (SENOKOT-S) 8.6-50 MG per tablet Take 2 tablets by mouth daily. Patient not taking: Reported on 05/23/2014 03/29/14   Jonetta Osgood, MD  sucralfate (CARAFATE) 1 G tablet Take 1 tablet (1 g total) by  mouth 3 (three) times daily with meals as needed (vomiting,). 04/23/14   Melton Alar, PA-C  vancomycin 1,250 mg in sodium chloride 0.9 % 250 mL Inject 1,250 mg into the vein every 8 (eight) hours. 04/23/14 06/23/14  Melton Alar, PA-C    Physical Exam: Filed Vitals:   06/11/14 2051  BP: 99/53  Pulse: 90  Temp: 98.2 F (36.8 C)  TempSrc: Oral  Resp: 15  Height: 5\' 9"  (1.753 m)  Weight: 75.3 kg (166 lb 0.1 oz)  SpO2: 95%     General:  Poorly built and nourished.  Eyes: Anicteric no pallor.  ENT: No discharge from the ears eyes nose or mouth.  Neck: No mass felt.  Cardiovascular: S1 and S2 heard.  Respiratory: No rhonchi or crepitations.  Abdomen: Soft nontender bowel from present.  Skin: No rash. Has history of sacral decubitus which at this  time is difficult to assess.  Musculoskeletal: No edema.  Psychiatric: Patient is lethargic but answers questions appropriately.  Neurologic: Patient is lethargic but answers questions appropriately.  Labs on Admission:  Basic Metabolic Panel: No results for input(s): NA, K, CL, CO2, GLUCOSE, BUN, CREATININE, CALCIUM, MG, PHOS in the last 168 hours. Liver Function Tests: No results for input(s): AST, ALT, ALKPHOS, BILITOT, PROT, ALBUMIN in the last 168 hours. No results for input(s): LIPASE, AMYLASE in the last 168 hours. No results for input(s): AMMONIA in the last 168 hours. CBC: No results for input(s): WBC, NEUTROABS, HGB, HCT, MCV, PLT in the last 168 hours. Cardiac Enzymes: No results for input(s): CKTOTAL, CKMB, CKMBINDEX, TROPONINI in the last 168 hours.  BNP (last 3 results) No results for input(s): PROBNP in the last 8760 hours. CBG: No results for input(s): GLUCAP in the last 168 hours.  Radiological Exams on Admission: No results found.   Assessment/Plan Principal Problem:   Acute encephalopathy Active Problems:   AIDS   Cirrhosis, non-alcoholic   Pancytopenia   1. Acute encephalopathy - I did  discuss with on-call infectious disease consultant Dr. Megan Salon. At this time Dr. Megan Salon feels patient MRI findings may be secondary to issues known history of discitis. Dr. Megan Salon feels that patient's mental status changes hypoxia and being mildly febrile may be related to patient developing pneumonia and also possible PICC line infection. Dr. Megan Salon is advised to continue with vancomycin and fluconazole which patient is already on and add cefepime. Infectious disease will be seeing patient in consult. Follow cultures. We will in addition check ammonia levels given patient history of cirrhosis of the liver. Patient is not in any respiratory distress at this time. 2. AIDS with last CD4 count around 50 but per Dr. Megan Salon patient's viral load was undetectable - continue present medications. 3. 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 Patient also has had previous history of lymphoma and as per the records recent bone marrow did not show any recurrence. Closely follow CBC. I have type and screen in case patient's hemoglobin further decreases. 4. 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 Check ammonia levels. 5. Recently admitted in November 2015 for discitis and has had surgery and was on multiple doses of antibiotics.   DVT Prophylaxis SCDs for now but if there is no procedure anticipated may change to Lovenox.  Code Status: Full code.  Family Communication: None.  Disposition Plan: Admit to inpatient.    Sujata Maines N. [facility] Pager [phone].  If 7PM-7AM, please contact night-coverage [email or web address] Password [email or web address] 06/11/2014, 10:02 PM

## 2014-06-11 NOTE — Progress Notes (Signed)
ANTIBIOTIC CONSULT NOTE - INITIAL  Pharmacy Consult for Vancomycin and Cefepime Indication: lumbar discitis and HCAP  Allergies  Allergen Reactions  . Codeine Anaphylaxis, Hives, Nausea And Vomiting, Nausea Only and Swelling  . Oxycodone Other (See Comments)    Other reaction(s): Dizziness Lightheaded, nausea    Patient Measurements: Height: _0  (175.3 cm) Weight: 166 lb 0.1 oz (75.3 kg) IBW/kg (Calculated) : 70.7  Vital Signs: Temp: 98.2 F (36.8 C) (01/04 2051) Temp Source: Oral (01/04 2051) BP: 99/53 mmHg (01/04 2051) Pulse Rate: 90 (01/04 2051)  Labs from [facility] records:   06/11/14: WBC 2.8, Hgb 7.9, platelet count 71, ESR 73   06/08/15:  Vanc trough level = 14 mcg/ml, Scr 0.85     Medical History: Past Medical History  Diagnosis Date  . HIV disease   . Cirrhosis   . PTSD (post-traumatic stress disorder)   . Depressive disorder   . Cancer   . Lymphoma   . Anemia    Assessment:   Transferred from [facility] ED to [facility].     Has been on Vancomycin since 04/20/14 for lumbar discitis/osteomyelitis.  Per 05/23/14 ID note, plan was for at least 3 more weeks from that date, which would be 06/13/14.   Vanc to continue here.  Dose noted to be infusing when patient arrived at [facility] ED today.  Current regimen = 750 mg IV q8hrs.  Vanc trough level 14.0 mcg/ml on 06/07/14, almost at goal.   Adding Cefepime for HCAP coverage.   Goal of Therapy:  Vancomycin trough level 15-20 mcg/ml appropriate Cefepime dose for renal function and infection  Plan:   Continue Vancomycin 750 mg IV q8hrs.  Begin Cefepime 1 gram IV q8hrs.  Will follow renal function, culture data and progress.  Weekly Vancomycin trough level due on 06/14/14.  Arty Baumgartner, [place] Pager: [phone] 06/11/2014,10:41 PM

## 2014-06-11 NOTE — Progress Notes (Signed)
Pt arrived to unit accompanied by EMS. Pt oriented to room/unit. VS stable. MD paged and notified of pt's arrival. Awaiting MD to come see pt.

## 2014-06-12 ENCOUNTER — Inpatient Hospital Stay (HOSPITAL_COMMUNITY): Payer: Medicaid Other

## 2014-06-12 DIAGNOSIS — M549 Dorsalgia, unspecified: Secondary | ICD-10-CM

## 2014-06-12 DIAGNOSIS — M4646 Discitis, unspecified, lumbar region: Secondary | ICD-10-CM | POA: Insufficient documentation

## 2014-06-12 DIAGNOSIS — B952 Enterococcus as the cause of diseases classified elsewhere: Secondary | ICD-10-CM | POA: Insufficient documentation

## 2014-06-12 DIAGNOSIS — B20 Human immunodeficiency virus [HIV] disease: Secondary | ICD-10-CM | POA: Insufficient documentation

## 2014-06-12 DIAGNOSIS — R7881 Bacteremia: Secondary | ICD-10-CM

## 2014-06-12 DIAGNOSIS — R410 Disorientation, unspecified: Secondary | ICD-10-CM

## 2014-06-12 DIAGNOSIS — Z1621 Resistance to vancomycin: Secondary | ICD-10-CM

## 2014-06-12 DIAGNOSIS — M659 Synovitis and tenosynovitis, unspecified: Secondary | ICD-10-CM

## 2014-06-12 DIAGNOSIS — M4644 Discitis, unspecified, thoracic region: Secondary | ICD-10-CM | POA: Insufficient documentation

## 2014-06-12 DIAGNOSIS — B379 Candidiasis, unspecified: Secondary | ICD-10-CM | POA: Insufficient documentation

## 2014-06-12 DIAGNOSIS — A4189 Other specified sepsis: Secondary | ICD-10-CM | POA: Insufficient documentation

## 2014-06-12 DIAGNOSIS — A491 Streptococcal infection, unspecified site: Secondary | ICD-10-CM | POA: Insufficient documentation

## 2014-06-12 DIAGNOSIS — R509 Fever, unspecified: Secondary | ICD-10-CM

## 2014-06-12 DIAGNOSIS — G9340 Encephalopathy, unspecified: Secondary | ICD-10-CM

## 2014-06-12 DIAGNOSIS — J189 Pneumonia, unspecified organism: Secondary | ICD-10-CM | POA: Insufficient documentation

## 2014-06-12 LAB — BASIC METABOLIC PANEL
Anion gap: 3 — ABNORMAL LOW (ref 5–15)
CO2: 21 mmol/L (ref 19–32)
CREATININE: 0.58 mg/dL (ref 0.50–1.35)
Calcium: 7.7 mg/dL — ABNORMAL LOW (ref 8.4–10.5)
Chloride: 107 mEq/L (ref 96–112)
GFR calc non Af Amer: >90 mL/min (ref >90)
Glucose, Bld: 91 mg/dL (ref 70–99)
Potassium: 3.9 mmol/L (ref 3.5–5.1)
Sodium: 131 mmol/L — ABNORMAL LOW (ref 135–145)

## 2014-06-12 LAB — CBC
HCT: 25.2 % — ABNORMAL LOW (ref 39.0–52.0)
Hemoglobin: 8.2 g/dL — ABNORMAL LOW (ref 13.0–17.0)
MCH: 32.2 pg (ref 26.0–34.0)
MCHC: 32.5 g/dL (ref 30.0–36.0)
MCV: 98.8 fL (ref 78.0–100.0)
Platelets: 82 10*3/uL — ABNORMAL LOW (ref 150–400)
RBC: 2.55 MIL/uL — AB (ref 4.22–5.81)
RDW: 19.1 % — ABNORMAL HIGH (ref 11.5–15.5)
WBC: 3.1 10*3/uL — ABNORMAL LOW (ref 4.0–10.5)

## 2014-06-12 LAB — GLUCOSE, CAPILLARY
GLUCOSE-CAPILLARY: 105 mg/dL — AB (ref 70–99)
Glucose-Capillary: 88 mg/dL (ref 70–99)

## 2014-06-12 MED ORDER — IPRATROPIUM-ALBUTEROL 0.5-2.5 (3) MG/3ML IN SOLN: 3.0000 mL | RESPIRATORY_TRACT | Status: DC | PRN

## 2014-06-12 MED ORDER — GADOBENATE DIMEGLUMINE 529 MG/ML IV SOLN
15.0000 mL | Freq: Once | INTRAVENOUS | Status: AC | PRN
  Administered 2014-06-12: 15 mL via INTRAVENOUS

## 2014-06-12 MED ORDER — BOOST / RESOURCE BREEZE PO LIQD
1.0000 | Freq: Three times a day (TID) | ORAL | Status: DC
  Administered 2014-06-12 – 2014-06-19 (×20): 1 via ORAL

## 2014-06-12 MED ORDER — LACTULOSE 10 GM/15ML PO SOLN
30.0000 g | Freq: Every day | ORAL | Status: DC
  Administered 2014-06-13: 10 g via ORAL
  Administered 2014-06-13: 30 g via ORAL
  Filled 2014-06-12 (×2): qty 45

## 2014-06-12 MED ORDER — LORAZEPAM 2 MG/ML IJ SOLN
1.0000 mg | INTRAMUSCULAR | Status: DC | PRN
  Administered 2014-06-12 – 2014-06-16 (×3): 2 mg via INTRAVENOUS
  Filled 2014-06-12 (×4): qty 1

## 2014-06-12 NOTE — Clinical Social Work Psychosocial (Signed)
Clinical Social Work Department BRIEF PSYCHOSOCIAL ASSESSMENT 06/12/2014  Patient:  KHYLE, GOODELL     Account Number:  [id number]     Admit date:  06/11/2014  Clinical Social Worker:  Marciano Sequin  Date/Time:  06/12/2014 01:16 PM  Referred by:  RN  Date Referred:  06/12/2014 Referred for  SNF Placement   Other Referral:   Interview type:  Patient Other interview type:    PSYCHOSOCIAL DATA Living Status:  FACILITY Admitted from facility:  [facility] Level of care:  [place] Primary support name:  Bailey,Marioin Primary support relationship to patient:  FRIEND Degree of support available:   Fair Support    CURRENT CONCERNS Current Concerns  None Noted   Other Concerns:    SOCIAL WORK ASSESSMENT / PLAN CSW met pt at bedside. CSW introduced self and purpose of visit. Pt reported coming from [facility] where he was receiving IV abx treatment. Pt reported he will return back to [facility]. CSW provided the pt with contact information for further questions. CSW will continue to follow this pt and assist with discharge as needed.   Assessment/plan status:  Psychosocial Support/Ongoing Assessment of Needs Other assessment/ plan:   Information/referral to community resources:    PATIENT'S/FAMILY'S RESPONSE TO PLAN OF CARE: Pt represenetd with a sad affect and distant mood. Pt oriented. Pt receptive dueing assesment. Pt acknowledged and agreed with the current plan of care.    [place], MSW, [place]

## 2014-06-12 NOTE — Progress Notes (Signed)
Utilization review completed.  

## 2014-06-12 NOTE — Progress Notes (Signed)
5146 Pt had 6 beat run of wide QRS v-tach. Amion down, unable to page [facility]. Day nurse, Melvenia Needles, RN made aware of incident.

## 2014-06-12 NOTE — Consult Note (Signed)
[facility]    Date of Admission:  06/11/2014  Date of Consult:  06/12/2014  Reason for Consult:  Worsening diskitis, back pain and fever Referring Physician: Dr. Sherral Hammers   HPI: Walther Sanagustin is an 39 y.o. male. 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 We took him off antibiotics for a week prior to surgery and he is now sp L5-S1 microdiscectomy with nerve root discetomy and biopsies mx for culture by Dr. Saintclair Halsted. Operative cultures failed to grow any bacterial organisms or fungal cultures at 4 weeks if the cultures are still pending. He is placed on micafungin and vancomycin has remained on these for 5 weeks. I had seen him in my clinic and changed him from micafungin to high dose fluconazole and continued IV vancomycin. I saw him on 05/23/14 and was bothered by his persistent back pain and had planned on repeating MRI of his thoracic and lumbar spine but it had not been approved by his insurance yet.  In the interim he had progressive worsening with fevers lethargy and was transported through his skilled nursing facility to [facility].  At [facility] he had an MRI of his lumbar and thoracic spine. I do not have the physical report at hand but I have reviewed what was found with Dr. Chauncey Cruel.  Apparently there is some collapse of the T11-T12 vertebrae with some impingement on the cord at that level. There is progressive discitis at L3 and L4 and persistent discitis at all other  levels. There is questionable of an S1 ventral epidural abscess though this is also the site that he had his microdiscectomy. He was admitted to the hospital service overnight and in addition to vancomycin and fluconazole the patient had cefepime added after blood cultures had been obtained.     Past Medical History  Diagnosis Date  . HIV disease   . Cirrhosis   . PTSD (post-traumatic stress disorder)   . Depressive disorder   . Cancer   . Lymphoma   . Anemia     Past Surgical History  Procedure Laterality Date  . Cholecystectomy    . Appendectomy    . Tee without cardioversion N/A 11/13/2013    Procedure: TRANSESOPHAGEAL ECHOCARDIOGRAM (TEE);  Surgeon: Dorothy Spark, MD;  Location: [place];  Service: Cardiovascular;  Laterality: N/A;  . Infection of spine    . Lumbar laminectomy/decompression microdiscectomy Left 04/16/2014    Procedure: Left Lumbar five-Sacral one laminectomy decompression and discectomy ;  Surgeon: Elaina Hoops, MD;  Location: [place] [facility];  Service: Neurosurgery;  Laterality: Left;  ergies:   Allergies  Allergen Reactions  . Codeine Anaphylaxis, Hives, Nausea And Vomiting, Nausea Only and Swelling  . Oxycodone Other (See Comments)    Other reaction(s): Dizziness Lightheaded, nausea     Medications: I have reviewed patients current medications as documented in Epic Anti-infectives    Start     Dose/Rate Route Frequency Ordered Stop   06/12/14 1000  azithromycin (ZITHROMAX) tablet 1,200 mg     1,200 mg Oral Weekly  06/11/14 2201     06/12/14 0700  ritonavir (NORVIR) capsule 100 mg     100 mg Oral Daily with breakfast 06/11/14 2201     06/12/14 0000  vancomycin (VANCOCIN) IVPB 750 mg/150 ml premix     750 mg150 mL/hr over 60 Minutes Intravenous Every 8 hours 06/11/14 2236     06/11/14 2300  ceFEPIme (MAXIPIME) 1 g in dextrose 5 % 50 mL IVPB     1 g100 mL/hr over 30 Minutes Intravenous 3 times per day 06/11/14 2236     06/11/14 2215  fluconazole  (DIFLUCAN) tablet 400 mg     400 mg Oral Daily 06/11/14 2201     06/11/14 2215  vancomycin (VANCOCIN) 1,250 mg in sodium chloride 0.9 % 250 mL IVPB  Status:  Discontinued     1,250 mg166.7 mL/hr over 90 Minutes Intravenous Every 8 hours 06/11/14 2201 06/11/14 2235   06/11/14 2215  atovaquone (MEPRON) 750 MG/5ML suspension 750 mg     750 mg Oral Daily 06/11/14 2201     06/11/14 2215  Darunavir Ethanolate (PREZISTA) tablet 800 mg     800 mg Oral Daily 06/11/14 2201     06/11/14 2215  emtricitabine-tenofovir (TRUVADA) 200-300 MG per tablet 1 tablet     1 tablet Oral Daily at bedtime 06/11/14 2201        Social History:  reports that he has never smoked. He has never used smokeless tobacco. He reports that he does not drink alcohol or use illicit drugs.  Family History  Problem Relation Age of Onset  . Cancer Maternal Grandmother     breast ca  . Cancer Maternal Grandfather     colon ca    As in HPI and primary teams notes otherwise 12 point review of systems is negative  Blood pressure 122/64, pulse 99, temperature 97.9 F (36.6 C), temperature source Oral, resp. rate 19, height 5' 9" (1.753 m), weight 166 lb 0.1 oz (75.3 kg), SpO2 96 %. General: Slumped over in bed on his side speaking in very soft voice oriented to place and person and location HEENT: , EOMI,  CVS regular rate, normal r,  no murmur rubs or gallops Chest:  no wheezing, rales or rhonchi Abdomen: soft  nondistended,  Extremities, MSK: tender to palpation T and L spine areas Neuro: nonfocal, strength and sensation intact   Results for orders placed or performed during the hospital encounter of 06/11/14 (from the past 48 hour(s))  MRSA PCR Screening     Status: None   Collection Time: 06/11/14  8:57 PM  Result Value Ref Range   MRSA by PCR NEGATIVE NEGATIVE    Comment:        The GeneXpert MRSA Assay (FDA approved for NASAL specimens only), is one component of a comprehensive MRSA colonization surveillance  program. It is not intended to diagnose MRSA infection nor to guide or monitor treatment for MRSA infections.   Comprehensive metabolic panel     Status: Abnormal   Collection Time: 06/11/14 10:25 PM  Result Value Ref Range   Sodium 128 (L) 135 - 145 mmol/L    Comment: Please note change in reference range.   Potassium 3.7 3.5 - 5.1 mmol/L    Comment: Please note change in reference range.   Chloride 106 96 - 112 mEq/L   CO2 20 19 - 32 mmol/L   Glucose, Bld 101 (H) 70 - 99 mg/dL   BUN 6 6 - 23 mg/dL   Creatinine,  Ser 0.55 0.50 - 1.35 mg/dL   Calcium 7.4 (L) 8.4 - 10.5 mg/dL   Total Protein 6.1 6.0 - 8.3 g/dL   Albumin 1.6 (L) 3.5 - 5.2 g/dL   AST 61 (H) 0 - 37 U/L   ALT 26 0 - 53 U/L   Alkaline Phosphatase 125 (H) 39 - 117 U/L   Total Bilirubin 2.4 (H) 0.3 - 1.2 mg/dL   GFR calc non Af Amer >90 >90 mL/min   GFR calc Af Amer >90 >90 mL/min    Comment: (NOTE) The eGFR has been calculated using the CKD EPI equation. This calculation has not been validated in all clinical situations. eGFR's persistently <90 mL/min signify possible Chronic Kidney Disease.    Anion gap 2 (L) 5 - 15    Comment: REPEATED TO VERIFY  Lactic acid, plasma     Status: None   Collection Time: 06/11/14 10:25 PM  Result Value Ref Range   Lactic Acid, Venous 1.6 0.5 - 2.2 mmol/L  D-dimer, quantitative     Status: Abnormal   Collection Time: 06/11/14 10:25 PM  Result Value Ref Range   D-Dimer, Quant 4.13 (H) 0.00 - 0.48 ug/mL-FEU    Comment:        AT THE INHOUSE ESTABLISHED CUTOFF VALUE OF 0.48 ug/mL FEU, THIS ASSAY HAS BEEN DOCUMENTED IN THE LITERATURE TO HAVE A SENSITIVITY AND NEGATIVE PREDICTIVE VALUE OF AT LEAST 98 TO 99%.  THE TEST RESULT SHOULD BE CORRELATED WITH AN ASSESSMENT OF THE CLINICAL PROBABILITY OF DVT / VTE.   CBC with Differential     Status: Abnormal   Collection Time: 06/11/14 10:25 PM  Result Value Ref Range   WBC 2.6 (L) 4.0 - 10.5 K/uL   RBC 2.38 (L) 4.22 - 5.81 MIL/uL     Hemoglobin 7.9 (L) 13.0 - 17.0 g/dL   HCT 23.5 (L) 39.0 - 52.0 %   MCV 98.7 78.0 - 100.0 fL   MCH 33.2 26.0 - 34.0 pg   MCHC 33.6 30.0 - 36.0 g/dL   RDW 19.2 (H) 11.5 - 15.5 %   Platelets 63 (L) 150 - 400 K/uL    Comment: PLATELET COUNT CONFIRMED BY SMEAR SPECIMEN CHECKED FOR CLOTS    Neutrophils Relative % 75 43 - 77 %   Neutro Abs 2.0 1.7 - 7.7 K/uL   Lymphocytes Relative 12 12 - 46 %   Lymphs Abs 0.3 (L) 0.7 - 4.0 K/uL   Monocytes Relative 11 3 - 12 %   Monocytes Absolute 0.3 0.1 - 1.0 K/uL   Eosinophils Relative 1 0 - 5 %   Eosinophils Absolute 0.0 0.0 - 0.7 K/uL   Basophils Relative 0 0 - 1 %   Basophils Absolute 0.0 0.0 - 0.1 K/uL  Ammonia     Status: Abnormal   Collection Time: 06/11/14 10:25 PM  Result Value Ref Range   Ammonia 77 (H) 11 - 32 umol/L    Comment: Please note change in reference range.  Type and screen     Status: None   Collection Time: 06/11/14 11:55 PM  Result Value Ref Range   ABO/RH(D) A POS    Antibody Screen NEG    Sample Expiration 06/14/2014   Glucose, capillary     Status: None   Collection Time: 06/12/14 12:00 AM  Result Value Ref Range   Glucose-Capillary 88 70 - 99 mg/dL  Basic metabolic panel     Status: Abnormal   Collection Time: 06/12/14  3:52 AM  Result Value Ref Range   Sodium 131 (L) 135 - 145 mmol/L    Comment: Please note change in reference range.   Potassium 3.9 3.5 - 5.1 mmol/L    Comment: Please note change in reference range.   Chloride 107 96 - 112 mEq/L   CO2 21 19 - 32 mmol/L   Glucose, Bld 91 70 - 99 mg/dL   BUN <5 (L) 6 - 23 mg/dL    Comment: REPEATED TO VERIFY   Creatinine, Ser 0.58 0.50 - 1.35 mg/dL   Calcium 7.7 (L) 8.4 - 10.5 mg/dL   GFR calc non Af Amer >90 >90 mL/min   GFR calc Af Amer >90 >90 mL/min    Comment: (NOTE) The eGFR has been calculated using the CKD EPI equation. This calculation has not been validated in all clinical situations. eGFR's persistently <90 mL/min signify possible Chronic  Kidney Disease.    Anion gap 3 (L) 5 - 15  CBC     Status: Abnormal   Collection Time: 06/12/14  3:52 AM  Result Value Ref Range   WBC 3.1 (L) 4.0 - 10.5 K/uL   RBC 2.55 (L) 4.22 - 5.81 MIL/uL   Hemoglobin 8.2 (L) 13.0 - 17.0 g/dL   HCT 25.2 (L) 39.0 - 52.0 %   MCV 98.8 78.0 - 100.0 fL   MCH 32.2 26.0 - 34.0 pg   MCHC 32.5 30.0 - 36.0 g/dL   RDW 19.1 (H) 11.5 - 15.5 %   Platelets 82 (L) 150 - 400 K/uL    Comment: CONSISTENT WITH PREVIOUS RESULT  Glucose, capillary     Status: Abnormal   Collection Time: 06/12/14  5:11 AM  Result Value Ref Range   Glucose-Capillary 105 (H) 70 - 99 mg/dL   _0 (sdes,specrequest,cult,reptstatus)   ) Recent Results (from the past 720 hour(s))  MRSA PCR Screening     Status: None   Collection Time: 06/11/14  8:57 PM  Result Value Ref Range Status   MRSA by PCR NEGATIVE NEGATIVE Final    Comment:        The GeneXpert MRSA Assay (FDA approved for NASAL specimens only), is one component of a comprehensive MRSA colonization surveillance program. It is not intended to diagnose MRSA infection nor to guide or monitor treatment for MRSA infections.      Impression/Recommendation  Principal Problem:   Acute encephalopathy Active Problems:   AIDS   Cirrhosis, non-alcoholic   Pancytopenia   Jermane Brayboy is a 39 y.o. 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 We took him off antibiotics for a week prior to surgery and he  is now sp L5-S1 microdiscectomy with nerve root discetomy and biopsies mx for culture by Dr. Saintclair Halsted. Operative cultures failed to grow any bacterial organisms or fungal cultures at 4 weeks if the cultures are still pending. He was on placed on micafungin and vancomycin has remained on these for 5 weeks then vancomycin and high fluconazole but with worsening back pain prior even to the switch. MRI now shows  progressive discitis at the L3 L4 region collapse of vertebra at T11-12 and questionable ventral epidural abscess near the surgical site.  #1 Progressive discitis by MR imaging in the L 3 L4 region with question of ventral epidural abscess at Sacral area  --Would recommend primary team get in touch with Dr. Dominica Severin cram to see what he thinks of the images and formal consult if appropropriate  --currently he is in on VERY broad-spectrum antimicrobials with vancomycin fluconazole and cefepime   #2 Prior RIght hip synovitis: not complaining of hip or leg pain but may need to consider reimaging these areas   #3 HIV continue his current regimen is quite well controlled  #4 Sirs fevers and confusion: Agree that there is possibility could have an infection of his PICC line and will follow-up his blood cultures is antibiotics have been appropriately broadened.  I spent greater than 40 minutes with the patient including greater than 50% of time in face to face counsel of the patient and in coordination of their care.   06/12/2014, 1:20 PM   Thank you so much for this interesting consult  [facility] [phone] (pager) [phone] (office) 06/12/2014, 1:20 PM  Rhina Brack[place] 06/12/2014, 1:20 PM

## 2014-06-12 NOTE — Progress Notes (Signed)
INITIAL NUTRITION ASSESSMENT  DOCUMENTATION CODES Per approved criteria  -Severe malnutrition in the context of chronic illness   INTERVENTION:  Resource Breeze PO TID, each supplement provides 250 kcal and 9 grams of protein  NUTRITION DIAGNOSIS: Malnutrition related to inadequate oral intake as evidenced by intake </= 75% of estimated energy requirement for >/= 1 month and 15% weight loss within 6 months.   Goal: Intake to meet >90% of estimated nutrition needs.  Monitor:  PO intake, labs, weight trend.  Reason for Assessment: RN Request for poor PO intake  39 y.o. male  Admitting Dx: Acute encephalopathy  ASSESSMENT: 39 y.o. male with history of AIDS, cirrhosis of the liver, history of lymphoma and pancytopenia was brought to the ER at [facility] from rehabilitation after patient was found to be increasingly lethargic with back pain (worsening discitis).  Patient reports that he has had a very poor appetite and poor intake for the past few months. He endorses a lot of weight loss over the past 6 months. He drinks Boost in a can at his nursing facility. Prefers [employer] for now. Unable to eat much food, just wants jello for supper. Unable to complete nutrition focused physical exam at this time.  Pt meets criteria for severe MALNUTRITION in the context of chronic illness as evidenced by intake </= 75% of estimated energy requirement for >/= 1 month and 15% weight loss within 6 months.  Height: Ht Readings from Last 1 Encounters:  06/11/14 5\' 9"  (1.753 m)    Weight: Wt Readings from Last 1 Encounters:  06/11/14 166 lb 0.1 oz (75.3 kg)    Ideal Body Weight: 72.7 kg  % Ideal Body Weight: 104%  Wt Readings from Last 10 Encounters:  06/11/14 166 lb 0.1 oz (75.3 kg)  05/23/14 155 lb 7 oz (70.506 kg)  04/23/14 154 lb 5.2 oz (70 kg)  02/19/14 167 lb 12 oz (76.091 kg)  02/05/14 178 lb 12.7 oz (81.1 kg)  01/05/14 194 lb (87.998 kg)  01/04/14 194 lb (87.998 kg)  12/10/13  195 lb (88.451 kg)  12/05/13 193 lb 4.8 oz (87.68 kg)  11/23/13 218 lb 8 oz (99.111 kg)    Usual Body Weight: 195 lb (6 months ago)  % Usual Body Weight: 85%  BMI:  Body mass index is 24.5 kg/(m^2).  Estimated Nutritional Needs: Kcal: 2200-2400 Protein: 115-125 gm Fluid: 2.4 L  Skin: WDL  Diet Order: Diet regular  EDUCATION NEEDS: -Education not appropriate at this time   Intake/Output Summary (Last 24 hours) at 06/12/14 1527 Last data filed at 06/12/14 1516  Gross per 24 hour  Intake 2511.67 ml  Output   2100 ml  Net 411.67 ml    Last BM: 1/4   Labs:   Recent Labs Lab 06/11/14 2225 06/12/14 0352  NA 128* 131*  K 3.7 3.9  CL 106 107  CO2 20 21  BUN 6 <5*  CREATININE 0.55 0.58  CALCIUM 7.4* 7.7*  GLUCOSE 101* 91    CBG (last 3)   Recent Labs  06/12/14 06/12/14 0511  GLUCAP 88 105*    Scheduled Meds: . antiseptic oral rinse  7 mL Mouth Rinse BID  . atovaquone  750 mg Oral Daily  . azithromycin  1,200 mg Oral Weekly  . buPROPion  300 mg Oral Daily  . ceFEPime (MAXIPIME) IV  1 g Intravenous 3 times per day  . Darunavir Ethanolate  800 mg Oral Daily  . docusate sodium  100 mg Oral BID  .  emtricitabine-tenofovir  1 tablet Oral QHS  . feeding supplement (ENSURE COMPLETE)  237 mL Oral BID BM  . ferrous sulfate  325 mg Oral BID WC  . fluconazole  400 mg Oral Daily  . morphine  60 mg Oral Q12H  . pantoprazole  40 mg Oral Daily  . polyethylene glycol  17 g Oral Daily  . ritonavir  100 mg Oral Q breakfast  . senna-docusate  2 tablet Oral Daily  . vancomycin  750 mg Intravenous Q8H    Continuous Infusions: . sodium chloride 100 mL/hr at 06/11/14 2232    Past Medical History  Diagnosis Date  . HIV disease   . Cirrhosis   . PTSD (post-traumatic stress disorder)   . Depressive disorder   . Cancer   . Lymphoma   . Anemia     Past Surgical History  Procedure Laterality Date  . Cholecystectomy    . Appendectomy    . Tee without  cardioversion N/A 11/13/2013    Procedure: TRANSESOPHAGEAL ECHOCARDIOGRAM (TEE);  Surgeon: Dorothy Spark, MD;  Location: [place];  Service: Cardiovascular;  Laterality: N/A;  . Infection of spine    . Lumbar laminectomy/decompression microdiscectomy Left 04/16/2014    Procedure: Left Lumbar five-Sacral one laminectomy decompression and discectomy ;  Surgeon: Elaina Hoops, MD;  Location: [place] [facility];  Service: Neurosurgery;  Laterality: Left;    Molli Barrows, RD, LDN, [place] Pager [phone] After Hours Pager [phone]

## 2014-06-12 NOTE — Progress Notes (Signed)
[place] TEAM 1 - Stepdown/ICU TEAM Progress Note  Antwann Preziosi WLN:[id number] DOB: 1975/09/10 DOA: 06/11/2014 PCP: [name], MD  Admit HPI / Brief Narrative: Kalem Rockwell is a 39 y.o. 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 Patient also was found to be hypoxic and mildly febrile in the ER. Chest x-ray did not show any definite infiltrates. Patient has chronic indwelling PICC line. Patient was complaining of increasing back pain and MRI shows worsening discitis. Patient has been transferred to [facility] for further management. On exam patient is lethargic but answering questions appropriately. Denies any incontinence of urine or bowels. Denies any chest pain or shortness of breath. Patient's blood pressure is around 90 systolic and not tachycardic. Patient has been admitted for further management of mental status changes which could be from possible pneumonia or PICC line infection.   HPI/Subjective: 1/5 A/O 4, having moderate to severe pain in his mid to lower back which he states has been increasing over the past 2 weeks.    Assessment/Plan: Acute encephalopathy  -Resolved - Per admission note ID Dr. Megan Salon. Recommended that developing pneumonia and also possible PICC line infection cause of encephalopathy.  -Per ID will continue vancomycin,Fluconazole, and add Cefepime. -1/5 ID added azithromycin  -Ammonia level 77  Sepsis/HCAP/ PCP pneumonia?/bacteremia -Patient met criteria of sepsis with WBC<4 + HR>90 -See acute encephalopathy -See chronic back pain  Chronic back pain/osteomyelitis/discitis -Spoke with Dr. Kary Kos  (neurosurgery) who reviewed [place] MRI with me, stated there is no further surgical  intervention recommended. However would obtain T-spine and L-spine MRI with contrast and upon completion, stated would be glad to review findings over the phone  AIDS -last CD4 count around 50 but per Dr. Megan Salon patient's viral load was undetectable  -See acute cephalopathy/sepsis  Pancytopenia -Most likely multifactorial to include HIV or cirrhosis. -Monitor closely  Cirrhosis of the liver - secondary to lymphoma involving the liver and CHOP therapy - presently patient does not look hypervolemic. -Ammonia level 77, repeat in a.m. -Lactulose 30 g daily     Code Status: FULL Family Communication: No family present at time of exam Disposition Plan: Resolution sepsis    Consultants: Dr. Kary Kos  (neurosurgery) telephone consult [place] (ID)   Procedure/Significant Events: 1/4 MRI T-spine/L-spine without contrast at [place]; T11-12 discitis with slight further collapse of the endplates. Encroachment upon the ventral aspect the spinal canal -no compression of the cord or epidural abscess. -Progressive discitis  L3-4 level. -No collapse or epidural abscess.    Culture 1/4 blood pending   Antibiotics/antiretrovirals: Cefepime 1/4>> Fluconazole 1/4>> Azithromycin 1/5>> Vancomycin 1/5  Atovaquone 1/4>> Prezista 1/4>> Truvada 1/4   DVT prophylaxis: SCD   Devices   LINES / TUBES:      Continuous Infusions: . sodium chloride 100 mL/hr at 06/11/14 2232    Objective: VITAL SIGNS: Temp: 97.9 F (36.6 C) (01/05 1110) Temp Source: Oral (01/05 1110) BP: 123/63 mmHg (01/05 1516) Pulse Rate: 107 (01/05 1516) SPO2; FIO2:   Intake/Output Summary (Last 24 hours) at 06/12/14 1534 Last data filed at 06/12/14 1516  Gross per 24 hour  Intake 2511.67 ml  Output   2100 ml  Net 411.67 ml     Exam: General: A/O 4, moderate to severe pain mid to  lower back, No acute respiratory distress Lungs: Clear to auscultation bilaterally without wheezes  or crackles Cardiovascular: Regular rate and rhythm without murmur gallop or rub normal S1 and S2 Abdomen: Nontender, nondistended, soft, bowel sounds positive, no rebound, no ascites, no appreciable mass Extremities: No significant cyanosis, clubbing, or edema bilateral lower extremities  Data Reviewed: Basic Metabolic Panel:  Recent Labs Lab 06/11/14 2225 06/12/14 0352  NA 128* 131*  K 3.7 3.9  CL 106 107  CO2 20 21  GLUCOSE 101* 91  BUN 6 <5*  CREATININE 0.55 0.58  CALCIUM 7.4* 7.7*   Liver Function Tests:  Recent Labs Lab 06/11/14 2225  AST 61*  ALT 26  ALKPHOS 125*  BILITOT 2.4*  PROT 6.1  ALBUMIN 1.6*   No results for input(s): LIPASE, AMYLASE in the last 168 hours.  Recent Labs Lab 06/11/14 2225  AMMONIA 77*   CBC:  Recent Labs Lab 06/11/14 2225 06/12/14 0352  WBC 2.6* 3.1*  NEUTROABS 2.0  --   HGB 7.9* 8.2*  HCT 23.5* 25.2*  MCV 98.7 98.8  PLT 63* 82*   Cardiac Enzymes: No results for input(s): CKTOTAL, CKMB, CKMBINDEX, TROPONINI in the last 168 hours. BNP (last 3 results) No results for input(s): PROBNP in the last 8760 hours. CBG:  Recent Labs Lab 06/12/14 06/12/14 0511  GLUCAP 88 105*    Recent Results (from the past 240 hour(s))  MRSA PCR Screening     Status: None   Collection Time: 06/11/14  8:57 PM  Result Value Ref Range Status   MRSA by PCR NEGATIVE NEGATIVE Final    Comment:        The GeneXpert MRSA Assay (FDA approved for NASAL specimens only), is one component of a comprehensive MRSA colonization surveillance program. It is not intended to diagnose MRSA infection nor to guide or monitor treatment for MRSA infections.      Studies:  Recent x-ray studies have been reviewed in detail by the Attending Physician  Scheduled Meds:  Scheduled Meds: . antiseptic oral rinse  7 mL Mouth Rinse BID  . atovaquone  750 mg Oral Daily  . azithromycin  1,200 mg Oral Weekly  . buPROPion  300 mg Oral Daily  . ceFEPime  (MAXIPIME) IV  1 g Intravenous 3 times per day  . Darunavir Ethanolate  800 mg Oral Daily  . docusate sodium  100 mg Oral BID  . emtricitabine-tenofovir  1 tablet Oral QHS  . feeding supplement (ENSURE COMPLETE)  237 mL Oral BID BM  . ferrous sulfate  325 mg Oral BID WC  . fluconazole  400 mg Oral Daily  . morphine  60 mg Oral Q12H  . pantoprazole  40 mg Oral Daily  . polyethylene glycol  17 g Oral Daily  . ritonavir  100 mg Oral Q breakfast  . senna-docusate  2 tablet Oral Daily  . vancomycin  750 mg Intravenous Q8H    Time spent on care of this patient: 40 mins   Allie Bossier , MD   [facility] Office  [phone] Pager [phone]  On-Call/Text Page:      [email or web address]      password [email or web address]  If 7PM-7AM, please contact night-coverage [email or web address] Password [email or web address] 06/12/2014, 3:34 PM   LOS: 1 day

## 2014-06-13 DIAGNOSIS — Q782 Osteopetrosis: Secondary | ICD-10-CM | POA: Insufficient documentation

## 2014-06-13 LAB — GLUCOSE, CAPILLARY: GLUCOSE-CAPILLARY: 121 mg/dL — AB (ref 70–99)

## 2014-06-13 LAB — AMMONIA: AMMONIA: 64 umol/L — AB (ref 11–32)

## 2014-06-13 LAB — VANCOMYCIN, TROUGH: Vancomycin Tr: 14.7 ug/mL (ref 10.0–20.0)

## 2014-06-13 MED ORDER — LACTULOSE 10 GM/15ML PO SOLN
10.0000 g | Freq: Two times a day (BID) | ORAL | Status: DC
  Administered 2014-06-14 – 2014-06-17 (×3): 10 g via ORAL
  Filled 2014-06-13 (×15): qty 15

## 2014-06-13 MED ORDER — VANCOMYCIN HCL IN DEXTROSE 1-5 GM/200ML-% IV SOLN
1000.0000 mg | Freq: Three times a day (TID) | INTRAVENOUS | Status: DC
  Filled 2014-06-13 (×2): qty 200

## 2014-06-13 NOTE — Progress Notes (Signed)
[place] for Infectious Disease    Subjective: No new complaints   Antibiotics:  Anti-infectives    Start     Dose/Rate Route Frequency Ordered Stop   06/12/14 1000  azithromycin (ZITHROMAX) tablet 1,200 mg     1,200 mg Oral Weekly 06/11/14 2201     06/12/14 0700  ritonavir (NORVIR) capsule 100 mg     100 mg Oral Daily with breakfast 06/11/14 2201     06/12/14 0000  vancomycin (VANCOCIN) IVPB 750 mg/150 ml premix     750 mg150 mL/hr over 60 Minutes Intravenous Every 8 hours 06/11/14 2236     06/11/14 2300  ceFEPIme (MAXIPIME) 1 g in dextrose 5 % 50 mL IVPB     1 g100 mL/hr over 30 Minutes Intravenous 3 times per day 06/11/14 2236     06/11/14 2215  fluconazole (DIFLUCAN) tablet 400 mg     400 mg Oral Daily 06/11/14 2201     06/11/14 2215  vancomycin (VANCOCIN) 1,250 mg in sodium chloride 0.9 % 250 mL IVPB  Status:  Discontinued     1,250 mg166.7 mL/hr over 90 Minutes Intravenous Every 8 hours 06/11/14 2201 06/11/14 2235   06/11/14 2215  atovaquone (MEPRON) 750 MG/5ML suspension 750 mg     750 mg Oral Daily 06/11/14 2201     06/11/14 2215  Darunavir Ethanolate (PREZISTA) tablet 800 mg     800 mg Oral Daily 06/11/14 2201     06/11/14 2215  emtricitabine-tenofovir (TRUVADA) 200-300 MG per tablet 1 tablet     1 tablet Oral Daily at bedtime 06/11/14 2201        Medications: Scheduled Meds: . antiseptic oral rinse  7 mL Mouth Rinse BID  . atovaquone  750 mg Oral Daily  . azithromycin  1,200 mg Oral Weekly  . buPROPion  300 mg Oral Daily  . ceFEPime (MAXIPIME) IV  1 g Intravenous 3 times per day  . Darunavir Ethanolate  800 mg Oral Daily  . docusate sodium  100 mg Oral BID  . emtricitabine-tenofovir  1 tablet Oral QHS  . feeding supplement (RESOURCE BREEZE)  1 Container Oral TID BM  . ferrous sulfate  325 mg Oral BID WC  . fluconazole  400 mg Oral Daily  . lactulose  30 g Oral Daily  . morphine  60 mg Oral Q12H  . pantoprazole  40 mg Oral Daily  . polyethylene  glycol  17 g Oral Daily  . ritonavir  100 mg Oral Q breakfast  . senna-docusate  2 tablet Oral Daily  . vancomycin  750 mg Intravenous Q8H   Continuous Infusions:  PRN Meds:.acetaminophen **OR** acetaminophen, ALPRAZolam, antiseptic oral rinse, clonazePAM, ipratropium-albuterol, LORazepam, menthol-cetylpyridinium, methocarbamol, morphine, ondansetron **OR** ondansetron (ZOFRAN) IV, sucralfate    Objective: Weight change:   Intake/Output Summary (Last 24 hours) at 06/13/14 1544 Last data filed at 06/13/14 1427  Gross per 24 hour  Intake 1353.33 ml  Output   1140 ml  Net 213.33 ml   Blood pressure 98/55, pulse 98, temperature 98.7 F (37.1 C), temperature source Oral, resp. rate 17, height 5\' 9"  (1.753 m), weight 166 lb 0.1 oz (75.3 kg), SpO2 92 %. Temp:  [98.3 F (36.8 C)-99.6 F (37.6 C)] 98.7 F (37.1 C) (01/06 1205) Pulse Rate:  [98-118] 98 (01/06 1205) Resp:  [17-20] 17 (01/06 1205) BP: (98-117)/(55-66) 98/55 mmHg (01/06 1205) SpO2:  [92 %-96 %] 92 % (01/06 1205)  Physical Exam: General: Slumped over in bed on his  side speaking in very soft voice oriented to place and person and location HEENT: , EOMI,  CVS regular rate, normal r, no murmur rubs or gallops Chest: no wheezing, rales or rhonchi Abdomen: soft nondistended,  Extremities, MSK: tender to palpation T and L spine areas Neuro: nonfocal, strength and sensation intact CBC:  CBC Latest Ref Rng 06/12/2014 06/11/2014 04/21/2014  WBC 4.0 - 10.5 K/uL 3.1(L) 2.6(L) 1.5(L)  Hemoglobin 13.0 - 17.0 g/dL 8.2(L) 7.9(L) 7.3(L)  Hematocrit 39.0 - 52.0 % 25.2(L) 23.5(L) 21.8(L)  Platelets 150 - 400 K/uL 82(L) 63(L) 79(L)      BMET  Recent Labs  06/11/14 2225 06/12/14 0352  NA 128* 131*  K 3.7 3.9  CL 106 107  CO2 20 21  GLUCOSE 101* 91  BUN 6 <5*  CREATININE 0.55 0.58  CALCIUM 7.4* 7.7*     Liver Panel   Recent Labs  06/11/14 2225  PROT 6.1  ALBUMIN 1.6*  AST 61*  ALT 26  ALKPHOS 125*  BILITOT  2.4*       Sedimentation Rate No results for input(s): ESRSEDRATE in the last 72 hours. C-Reactive Protein No results for input(s): CRP in the last 72 hours.  Micro Results: Recent Results (from the past 720 hour(s))  MRSA PCR Screening     Status: None   Collection Time: 06/11/14  8:57 PM  Result Value Ref Range Status   MRSA by PCR NEGATIVE NEGATIVE Final    Comment:        The GeneXpert MRSA Assay (FDA approved for NASAL specimens only), is one component of a comprehensive MRSA colonization surveillance program. It is not intended to diagnose MRSA infection nor to guide or monitor treatment for MRSA infections.   Culture, blood (routine x 2)     Status: None (Preliminary result)   Collection Time: 06/11/14 10:23 PM  Result Value Ref Range Status   Specimen Description BLOOD LEFT HAND  Final   Special Requests BOTTLES DRAWN AEROBIC AND ANAEROBIC 5CC EACH  Final   Culture   Final           BLOOD CULTURE RECEIVED NO GROWTH TO DATE CULTURE WILL BE HELD FOR 5 DAYS BEFORE ISSUING A FINAL NEGATIVE REPORT Performed at [facility]    Report Status PENDING  Incomplete  Culture, blood (routine x 2)     Status: None (Preliminary result)   Collection Time: 06/11/14 10:31 PM  Result Value Ref Range Status   Specimen Description BLOOD LEFT WRIST  Final   Special Requests BOTTLES DRAWN AEROBIC AND ANAEROBIC 5CC EACH  Final   Culture   Final           BLOOD CULTURE RECEIVED NO GROWTH TO DATE CULTURE WILL BE HELD FOR 5 DAYS BEFORE ISSUING A FINAL NEGATIVE REPORT Performed at [facility]    Report Status PENDING  Incomplete    Studies/Results: Mr Thoracic Spine W Wo Contrast  06/13/2014   CLINICAL DATA:  Immunocompromised patient, status post spine surgery November 2015 with worsening discitis osteomyelitis (T11-12, L3-4, L4-5 and S1-2). History of cirrhosis, lymphoma.  EXAM: MRI THORACIC AND LUMBAR SPINE WITHOUT AND WITH CONTRAST  TECHNIQUE: Multiplanar and  multiecho pulse sequences of the thoracic and lumbar spine were obtained without and with intravenous contrast.  CONTRAST:  71mL MULTIHANCE GADOBENATE DIMEGLUMINE 529 MG/ML IV SOLN  COMPARISON:  MRI of the thoracic and lumbar spine June 11, 2014  FINDINGS: MR THORACIC SPINE FINDINGS  Motion degraded examination.  Stable moderate  wedging of vertebral body T11, mild wedging of vertebral body T12 associated with mildly expansile T2 bright enhancing signal within the T11-12 disc. Linear low T1, bright T2 mild linear enhancement within vertebral body T8 nonspecific though, there is an acute appearing associated Schmorl's node. Mild chronic wedging of vertebral body T4 as previously noted. Generalized decreased bone marrow signal which can be seen with patients myeloproliferative disorder.  Thoracic spinal cord appears normal morphology and signal characteristics, however there is ventral cord deformity at T11-12 due to small amount of retropulsed bony fragments. No abnormal cord, leptomeningeal nor epidural enhancement.  Small bilateral pleural effusions. Suspected splenomegaly incompletely imaged. Tiny LEFT central T7-8 disc protrusion. Small RIGHT central T8-9 and T9-10 disc protrusions. Small LEFT central T10-11 disc protrusion. Mild canal stenosis at T11-12 due to retropulsed bony fragments.  MR LUMBAR SPINE FINDINGS  Moderately motion degraded examination. Continued counting from the thoracic spine demonstrates transitional anatomy, lumbarized S1 vertebral body as previously noted. Moderate to severe  Moderate L3-4, moderate to severe L4-5 disc height loss, associated enhancing endplate Schmorl's nodes. Bright STIR signal without superimposed intradiscal enhancement, however there is patchy enhancement within the L3, L4 and L5 vertebra. Mild to moderate L5-S1 disc height loss, enhancing endplate changes and enhancing L5-S1 disc, status post LEFT L5-S1 hemilaminectomy. Enhancement extends through the surgical  defect without focal fluid collection. Abnormal T2 bright signal and enhancement within the RIGHT iliac bone, associated with T2 bright signal within the included RIGHT gluteal muscles. Generalized decreased T1 bone marrow signal which likely represents patient's myeloproliferative disorder.  No malalignment. Conus medullaris terminates at L1-1 2. No convincing evidence of cord, leptomeningeal abnormal enhancement. Enhancing granulation tissue within the LEFT S1-2 lateral recess without focal fluid collection. Assessment is somewhat limited by moderately motion degraded axial sequences. Suspected splenomegaly.  L1-2 and L2-3: No disc bulge, canal stenosis nor neural foraminal narrowing.  L3-4: Small broad-based disc bulge, no canal stenosis or neural foraminal narrowing.  L4-5: Small broad-based disc bulge, no canal stenosis. Mild LEFT neural foraminal narrowing.  L5-S1: No disc bulge. Mild facet arthropathy and ligamentum flavum redundancy. Very mild bilateral neural foraminal narrowing.  L5-S1: Status post LEFT hemilaminectomy. No osseous canal stenosis. Partial effacement of LEFT lateral recess may affect the traversing LEFT S2 nerve. Very mild RIGHT neural foraminal narrowing.  IMPRESSION: MRI THORACIC SPINE: Motion degraded examination. 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  Ill-defined enhancing signal within T8, associated inferior endplate Schmorl's though, this may reflect stress response.  MRI LUMBAR SPINE: Motion degraded examination. Transitional anatomy, lumbarized S1 vertebral body. Status post LEFT S1-2 hemilaminectomy, chronic appearing osteomyelitis. No epidural abscess.  Enhancement within L3, L4 and L5 vertebral bodies, without suspicious intradiscal enhancement may reflect chronic osteomyelitis/reactive changes.  Ill-defined signal in RIGHT iliac bone concerning for osteomyelitis with subjacent RIGHT gluteal myositis.   Electronically Signed    By: Elon Alas   On: 06/13/2014 01:01   Mr Lumbar Spine W Wo Contrast  06/13/2014   CLINICAL DATA:  Immunocompromised patient, status post spine surgery November 2015 with worsening discitis osteomyelitis (T11-12, L3-4, L4-5 and S1-2). History of cirrhosis, lymphoma.  EXAM: MRI THORACIC AND LUMBAR SPINE WITHOUT AND WITH CONTRAST  TECHNIQUE: Multiplanar and multiecho pulse sequences of the thoracic and lumbar spine were obtained without and with intravenous contrast.  CONTRAST:  62mL MULTIHANCE GADOBENATE DIMEGLUMINE 529 MG/ML IV SOLN  COMPARISON:  MRI of the thoracic and lumbar spine June 11, 2014  FINDINGS: MR THORACIC SPINE FINDINGS  Motion degraded examination.  Stable moderate wedging of vertebral body T11, mild wedging of vertebral body T12 associated with mildly expansile T2 bright enhancing signal within the T11-12 disc. Linear low T1, bright T2 mild linear enhancement within vertebral body T8 nonspecific though, there is an acute appearing associated Schmorl's node. Mild chronic wedging of vertebral body T4 as previously noted. Generalized decreased bone marrow signal which can be seen with patients myeloproliferative disorder.  Thoracic spinal cord appears normal morphology and signal characteristics, however there is ventral cord deformity at T11-12 due to small amount of retropulsed bony fragments. No abnormal cord, leptomeningeal nor epidural enhancement.  Small bilateral pleural effusions. Suspected splenomegaly incompletely imaged. Tiny LEFT central T7-8 disc protrusion. Small RIGHT central T8-9 and T9-10 disc protrusions. Small LEFT central T10-11 disc protrusion. Mild canal stenosis at T11-12 due to retropulsed bony fragments.  MR LUMBAR SPINE FINDINGS  Moderately motion degraded examination. Continued counting from the thoracic spine demonstrates transitional anatomy, lumbarized S1 vertebral body as previously noted. Moderate to severe  Moderate L3-4, moderate to severe L4-5 disc height  loss, associated enhancing endplate Schmorl's nodes. Bright STIR signal without superimposed intradiscal enhancement, however there is patchy enhancement within the L3, L4 and L5 vertebra. Mild to moderate L5-S1 disc height loss, enhancing endplate changes and enhancing L5-S1 disc, status post LEFT L5-S1 hemilaminectomy. Enhancement extends through the surgical defect without focal fluid collection. Abnormal T2 bright signal and enhancement within the RIGHT iliac bone, associated with T2 bright signal within the included RIGHT gluteal muscles. Generalized decreased T1 bone marrow signal which likely represents patient's myeloproliferative disorder.  No malalignment. Conus medullaris terminates at L1-1 2. No convincing evidence of cord, leptomeningeal abnormal enhancement. Enhancing granulation tissue within the LEFT S1-2 lateral recess without focal fluid collection. Assessment is somewhat limited by moderately motion degraded axial sequences. Suspected splenomegaly.  L1-2 and L2-3: No disc bulge, canal stenosis nor neural foraminal narrowing.  L3-4: Small broad-based disc bulge, no canal stenosis or neural foraminal narrowing.  L4-5: Small broad-based disc bulge, no canal stenosis. Mild LEFT neural foraminal narrowing.  L5-S1: No disc bulge. Mild facet arthropathy and ligamentum flavum redundancy. Very mild bilateral neural foraminal narrowing.  L5-S1: Status post LEFT hemilaminectomy. No osseous canal stenosis. Partial effacement of LEFT lateral recess may affect the traversing LEFT S2 nerve. Very mild RIGHT neural foraminal narrowing.  IMPRESSION: MRI THORACIC SPINE: Motion degraded examination. 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  Ill-defined enhancing signal within T8, associated inferior endplate Schmorl's though, this may reflect stress response.  MRI LUMBAR SPINE: Motion degraded examination. Transitional anatomy, lumbarized S1 vertebral body. Status  post LEFT S1-2 hemilaminectomy, chronic appearing osteomyelitis. No epidural abscess.  Enhancement within L3, L4 and L5 vertebral bodies, without suspicious intradiscal enhancement may reflect chronic osteomyelitis/reactive changes.  Ill-defined signal in RIGHT iliac bone concerning for osteomyelitis with subjacent RIGHT gluteal myositis.   Electronically Signed   By: Elon Alas   On: 06/13/2014 01:01                    Assessment/Plan:  Principal Problem:   Acute encephalopathy Active Problems:   AIDS   Cirrhosis, non-alcoholic   Pancytopenia   Lumbar discitis   Thoracic discitis   Enterococcal bacteremia   Vancomycin resistant Enterococcus   Candida albicans infection   HIV disease   HCAP (healthcare-associated pneumonia)   Sepsis due to other etiology    Samuel Schultz is a 39 y.o. male with  Samuel Schultz  is a 39 y.o. 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 We took him off antibiotics for a week prior to surgery and he is now sp L5-S1 microdiscectomy with nerve root discetomy and biopsies mx for culture by Dr. Saintclair Halsted. Operative cultures failed to grow any bacterial organisms or fungal cultures at 4 weeks if the cultures are still pending. He was on placed on micafungin and vancomycin has remained on these for 5 weeks then vancomycin and high fluconazole but with worsening back pain prior even to the switch. MRI now shows progressive discitis at the L3 L4  region collapse of vertebra at T11-12 and questionable ventral epidural abscess near the surgical site. MRI with contrast shows similar picture though motion degraded and also shows finding in iliac bone on the right suggestive of osteomyelitis  #1 Progressive discitis by MR imaging in the L 3 L4 region with question of ventral epidural abscess at Sacral area  --Dr. Kary Kos to review images  --currently he is in on VERY broad-spectrum antimicrobials with vancomycin fluconazole and cefepime   #2 Prior RIght hip synovitis now with ? Osteo on MRI of the ILIAC  --would get MRI bilateral hips with contrast tomorrow to look at ILIAC and hips  #3 HIV continue his current regimen is quite well controlled  #4 Sirs fevers and confusion: Agree that there is possibility could have an infection of his PICC line and will follow-up his blood cultures is antibiotics have been appropriately broadened. So far no growth. If continues will likely dc antibacterial therapy in anticipation of potential radiologic or surgically guided biopsies for cultures   LOS: 2 days   Alcide Evener 06/13/2014, 3:44 PM

## 2014-06-13 NOTE — Clinical Documentation Improvement (Signed)
Abnormal Lab and/or Testing Results: Sodium: 01/04: 128 01/05: 131   Treatment provided: 01/04: 0.9% NaCl @ 100 ml/hr.   Possible Clinical Conditions: > Hyponatremia > Other  > Not able to determine    Thank Sherian Maroon Documentation Specialist [phone] [email or web address]

## 2014-06-13 NOTE — Progress Notes (Signed)
[place] TEAM 1 - Stepdown/ICU TEAM Progress Note  Samuel Schultz MLY:[id number] DOB: 25-Feb-1976 DOA: 06/11/2014 PCP: [name], MD  Admit HPI / Brief Narrative: 39 y.o. M w/ Hx  AIDS, episodes of AMP sensitive enterococcal bacteremial, VRE, Candida Albicans isolated on aspirate from 01/31/14, cirrhosis of the liver, lymphoma, and pancytopenia who was taken to the ER at [facility] from rehabilitation after patient was found to be increasingly lethargic. Patient also was found to be hypoxic and mildly febrile in the ER. Chest x-ray did not show any definite infiltrates. Patient has chronic indwelling PICC line. Patient was complaining of increasing back pain and MRI suggested worsening discitis. Patient was transferred to [facility] for further management.   HPI/Subjective: Pt c/o ongoing pain in back, as well as in R hip/iliac crest region.  Denies cp, sob, n/v, or abdom pain.    Assessment/Plan:  Acute encephalopathy  -resolved -felt to be due to infection (chronic osteo, possible PICC infection) and hepatic enceph -abx tx per ID  -ammonia improving   Sepsis due to osteomyelitis +/- PICC infection  -Patient met criteria of sepsis with WBC<4 + HR>90 -abx tx per ID   Progressive chronic discitis L3-L4 region with question of ventral epidural abscess at sacral area -Dr. Kary Kos reviewed [place] MRI and stated no further surgical intervention recommended - obtained T-spine and L-spine MRI which suggests progression of disease w/ possible ventral epidural abscess - ID communicating further w/ Dr. Saintclair Halsted   ?osteomyelitis of R hip For MRI 1/7 to better evaluate  AIDS -last CD4 count around 50 but per Dr. 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 Kary Kos  (neurosurgery) telephone consult [place] (ID)  Procedure/Significant Events: 1/4 MRI T-spine/L-spine without contrast at [place]; T11-12 discitis with slight further collapse of the endplates. Encroachment upon the ventral aspect the spinal canal -no compression of the cord or epidural abscess.-Progressive discitis  L3-4 level. -No collapse or epidural abscess.  Antibiotics: Cefepime 1/4 > Fluconazole 1/5 > Vancomycin 1/4 >  DVT prophylaxis: SCD  Objective: Blood pressure 98/55, pulse 98, temperature 98.7 F (37.1 C), temperature source Oral, resp. rate 17, height 5' 9"  (1.753 m), weight 75.3 kg (166 lb 0.1 oz), SpO2 92 %.  Intake/Output Summary (Last 24 hours) at 06/13/14 1618 Last data filed at 06/13/14 1427  Gross per 24 hour  Intake 1203.33 ml  Output   1140 ml  Net  63.33 ml   Exam: General: No acute respiratory distress Lungs: Clear to auscultation bilaterally without wheezes or crackles Cardiovascular: Regular rate and rhythm without murmur gallop or rub  Abdomen: Nontender, nondistended, soft, bowel sounds positive, no rebound, no ascites, no appreciable mass Extremities: No significant cyanosis, clubbing, or edema bilateral lower extremities  Data Reviewed: Basic Metabolic Panel:  Recent Labs Lab 06/11/14 2225 06/12/14 0352  NA 128* 131*  K 3.7 3.9  CL 106 107  CO2 20 21  GLUCOSE 101* 91  BUN 6 <5*  CREATININE 0.55 0.58  CALCIUM 7.4* 7.7*   Liver Function Tests:  Recent Labs Lab 06/11/14 2225  AST 61*  ALT 26  ALKPHOS 125*  BILITOT 2.4*  PROT 6.1  ALBUMIN 1.6*    Recent Labs Lab 06/11/14 2225 06/13/14 0500  AMMONIA 77* 64*   CBC:  Recent Labs Lab 06/11/14 2225 06/12/14 0352  WBC 2.6* 3.1*  NEUTROABS 2.0  --   HGB 7.9* 8.2*  HCT 23.5*  25.2*  MCV 98.7 98.8  PLT 63* 82*   CBG:  Recent Labs Lab 06/12/14 06/12/14 0511 06/13/14 1257  GLUCAP 88 105* 121*    Recent Results (from the past 240 hour(s))  MRSA PCR Screening     Status: None   Collection Time: 06/11/14  8:57 PM  Result Value Ref Range Status   MRSA by PCR NEGATIVE NEGATIVE Final    Comment:        The GeneXpert MRSA Assay (FDA approved for NASAL specimens only), is one component of a comprehensive MRSA colonization surveillance program. It is not intended to diagnose MRSA infection nor to guide or monitor treatment for MRSA infections.   Culture, blood (routine x 2)     Status: None (Preliminary result)   Collection Time: 06/11/14 10:23 PM  Result Value Ref Range Status   Specimen Description BLOOD LEFT HAND  Final   Special Requests BOTTLES DRAWN AEROBIC AND ANAEROBIC 5CC EACH  Final   Culture   Final           BLOOD CULTURE RECEIVED NO GROWTH TO DATE CULTURE WILL BE HELD FOR 5 DAYS BEFORE ISSUING A FINAL NEGATIVE REPORT Performed at [facility]    Report Status PENDING  Incomplete  Culture, blood (routine x 2)     Status: None (Preliminary result)   Collection Time: 06/11/14 10:31 PM  Result Value Ref Range Status   Specimen Description BLOOD LEFT WRIST  Final   Special Requests BOTTLES DRAWN AEROBIC AND ANAEROBIC 5CC EACH  Final   Culture   Final           BLOOD CULTURE RECEIVED NO GROWTH TO DATE CULTURE WILL BE HELD FOR 5 DAYS BEFORE ISSUING A FINAL NEGATIVE REPORT Performed at [facility]    Report Status PENDING  Incomplete     Studies:  Recent x-ray studies have been reviewed in detail by the Attending Physician  Scheduled Meds:  Scheduled Meds: . antiseptic oral rinse  7 mL Mouth Rinse BID  . atovaquone  750 mg Oral Daily  . azithromycin  1,200 mg Oral Weekly  . buPROPion  300 mg Oral Daily  . Darunavir Ethanolate  800 mg Oral Daily  . docusate sodium  100 mg Oral BID  . emtricitabine-tenofovir  1  tablet Oral QHS  . feeding supplement (RESOURCE BREEZE)  1 Container Oral TID BM  . ferrous sulfate  325 mg Oral BID WC  . fluconazole  400 mg Oral Daily  . lactulose  30 g Oral Daily  . morphine  60 mg Oral Q12H  . pantoprazole  40 mg Oral Daily  . polyethylene glycol  17 g Oral Daily  . ritonavir  100 mg Oral Q breakfast  . senna-docusate  2 tablet Oral Daily    Time spent on care of this patient: 35 mins  Cherene Altes, MD [facility] For Consults/Admissions - Flow Manager - [phone] Office  [phone]  Contact MD directly via text page:      [email or web address]      password [facility]  06/13/2014, 4:18 PM   LOS: 2 days

## 2014-06-14 DIAGNOSIS — M869 Osteomyelitis, unspecified: Secondary | ICD-10-CM

## 2014-06-14 LAB — AMMONIA: Ammonia: 30 umol/L (ref 11–32)

## 2014-06-14 LAB — COMPREHENSIVE METABOLIC PANEL
ALT: 22 U/L (ref 0–53)
AST: 50 U/L — ABNORMAL HIGH (ref 0–37)
Albumin: 1.5 g/dL — ABNORMAL LOW (ref 3.5–5.2)
Alkaline Phosphatase: 105 U/L (ref 39–117)
Anion gap: 3 — ABNORMAL LOW (ref 5–15)
BUN: 7 mg/dL (ref 6–23)
CALCIUM: 7.7 mg/dL — AB (ref 8.4–10.5)
CO2: 22 mmol/L (ref 19–32)
CREATININE: 0.54 mg/dL (ref 0.50–1.35)
Chloride: 105 mEq/L (ref 96–112)
GFR calc non Af Amer: >90 mL/min (ref >90)
GLUCOSE: 78 mg/dL (ref 70–99)
Potassium: 3.7 mmol/L (ref 3.5–5.1)
Sodium: 130 mmol/L — ABNORMAL LOW (ref 135–145)
TOTAL PROTEIN: 5.6 g/dL — AB (ref 6.0–8.3)
Total Bilirubin: 2.3 mg/dL — ABNORMAL HIGH (ref 0.3–1.2)

## 2014-06-14 LAB — CBC
HEMATOCRIT: 20.8 % — AB (ref 39.0–52.0)
Hemoglobin: 6.9 g/dL — CL (ref 13.0–17.0)
MCH: 33.3 pg (ref 26.0–34.0)
MCHC: 33.2 g/dL (ref 30.0–36.0)
MCV: 100.5 fL — ABNORMAL HIGH (ref 78.0–100.0)
Platelets: 72 10*3/uL — ABNORMAL LOW (ref 150–400)
RBC: 2.07 MIL/uL — ABNORMAL LOW (ref 4.22–5.81)
RDW: 19.4 % — ABNORMAL HIGH (ref 11.5–15.5)
WBC: 2 10*3/uL — ABNORMAL LOW (ref 4.0–10.5)

## 2014-06-14 LAB — HEMOGLOBIN AND HEMATOCRIT, BLOOD
HCT: 23.9 % — ABNORMAL LOW (ref 39.0–52.0)
Hemoglobin: 8.1 g/dL — ABNORMAL LOW (ref 13.0–17.0)

## 2014-06-14 LAB — PREPARE RBC (CROSSMATCH)

## 2014-06-14 MED ORDER — ALPRAZOLAM 0.5 MG PO TABS: 0.5000 mg | ORAL_TABLET | Freq: Once | ORAL | Status: AC | PRN

## 2014-06-14 MED ORDER — SODIUM CHLORIDE 0.9 % IV SOLN
Freq: Once | INTRAVENOUS | Status: AC
  Administered 2014-06-14: 08:00:00 via INTRAVENOUS

## 2014-06-14 NOTE — Progress Notes (Signed)
Pt refusing MRI at this time. Education provided, all questions answered. Will continue to monitor. MD aware.

## 2014-06-14 NOTE — Progress Notes (Signed)
Orthopedic Tech Progress Note Patient Details:  Samuel Schultz 12-16-1975 [id number] [employer] called for brace order Patient ID: [name], male   DOB: [birth year], 39 y.o.   MRN: [id number]   Fenton Foy 06/14/2014, 1:28 PM

## 2014-06-14 NOTE — Progress Notes (Signed)
Samuel Schultz for Infectious Disease    Subjective: No new complaints   Antibiotics:  Anti-infectives    Start     Dose/Rate Route Frequency Ordered Stop   06/13/14 1700  vancomycin (VANCOCIN) IVPB 1000 mg/200 mL premix  Status:  Discontinued     1,000 mg200 mL/hr over 60 Minutes Intravenous Every 8 hours 06/13/14 1640 06/13/14 1644   06/12/14 1000  azithromycin (ZITHROMAX) tablet 1,200 mg     1,200 mg Oral Weekly 06/11/14 2201     06/12/14 0700  ritonavir (NORVIR) capsule 100 mg     100 mg Oral Daily with breakfast 06/11/14 2201     06/12/14 0000  vancomycin (VANCOCIN) IVPB 750 mg/150 ml premix  Status:  Discontinued     750 mg150 mL/hr over 60 Minutes Intravenous Every 8 hours 06/11/14 2236 06/13/14 1559   06/11/14 2300  ceFEPIme (MAXIPIME) 1 g in dextrose 5 % 50 mL IVPB  Status:  Discontinued     1 g100 mL/hr over 30 Minutes Intravenous 3 times per day 06/11/14 2236 06/13/14 1559   06/11/14 2215  fluconazole (DIFLUCAN) tablet 400 mg     400 mg Oral Daily 06/11/14 2201     06/11/14 2215  vancomycin (VANCOCIN) 1,250 mg in sodium chloride 0.9 % 250 mL IVPB  Status:  Discontinued     1,250 mg166.7 mL/hr over 90 Minutes Intravenous Every 8 hours 06/11/14 2201 06/11/14 2235   06/11/14 2215  atovaquone (MEPRON) 750 MG/5ML suspension 750 mg     750 mg Oral Daily 06/11/14 2201     06/11/14 2215  Darunavir Ethanolate (PREZISTA) tablet 800 mg     800 mg Oral Daily 06/11/14 2201     06/11/14 2215  emtricitabine-tenofovir (TRUVADA) 200-300 MG per tablet 1 tablet     1 tablet Oral Daily at bedtime 06/11/14 2201        Medications: Scheduled Meds: . antiseptic oral rinse  7 mL Mouth Rinse BID  . atovaquone  750 mg Oral Daily  . azithromycin  1,200 mg Oral Weekly  . buPROPion  300 mg Oral Daily  . Darunavir Ethanolate  800 mg Oral Daily  . docusate sodium  100 mg Oral BID  . emtricitabine-tenofovir  1 tablet Oral QHS  . feeding supplement (RESOURCE BREEZE)  1 Container Oral TID BM   . ferrous sulfate  325 mg Oral BID WC  . fluconazole  400 mg Oral Daily  . lactulose  10 g Oral BID  . morphine  60 mg Oral Q12H  . pantoprazole  40 mg Oral Daily  . polyethylene glycol  17 g Oral Daily  . ritonavir  100 mg Oral Q breakfast  . senna-docusate  2 tablet Oral Daily   Continuous Infusions:  PRN Meds:.acetaminophen **OR** acetaminophen, ALPRAZolam, ALPRAZolam, antiseptic oral rinse, clonazePAM, ipratropium-albuterol, LORazepam, menthol-cetylpyridinium, methocarbamol, morphine, ondansetron **OR** ondansetron (ZOFRAN) IV, sucralfate    Objective: Weight change:   Intake/Output Summary (Last 24 hours) at 06/14/14 1154 Last data filed at 06/14/14 0800  Gross per 24 hour  Intake    654 ml  Output    825 ml  Net   -171 ml   Blood pressure 103/50, pulse 101, temperature 98.4 F (36.9 C), temperature source Oral, resp. rate 17, height 5\' 9"  (1.753 m), weight 166 lb 0.1 oz (75.3 kg), SpO2 99 %. Temp:  [98 F (36.7 C)-98.7 F (37.1 C)] 98.4 F (36.9 C) (01/07 0951) Pulse Rate:  [88-101] 101 (01/07 0951) Resp:  [15-23]  17 (01/07 0951) BP: (91-118)/(46-93) 103/50 mmHg (01/07 0951) SpO2:  [91 %-100 %] 99 % (01/07 0951)  Physical Exam: General: Slumped over in bed on his side speaking in very soft voice oriented to place and person and location HEENT: , EOMI,  [facility] regular rate, normal r, no murmur rubs or gallops Chest: no wheezing, rales or rhonchi Abdomen: soft nondistended,  Extremities, MSK: tender to palpation T and L spine areas Neuro: nonfocal, strength and sensation intact CBC:  CBC Latest Ref Rng 06/14/2014 06/12/2014 06/11/2014  WBC 4.0 - 10.5 K/uL 2.0(L) 3.1(L) 2.6(L)  Hemoglobin 13.0 - 17.0 g/dL 6.9(LL) 8.2(L) 7.9(L)  Hematocrit 39.0 - 52.0 % 20.8(L) 25.2(L) 23.5(L)  Platelets 150 - 400 K/uL 72(L) 82(L) 63(L)      BMET  Recent Labs  06/12/14 0352 06/14/14 0328  NA 131* 130*  K 3.9 3.7  CL 107 105  CO2 21 22  GLUCOSE 91 78  BUN <5* 7    CREATININE 0.58 0.54  CALCIUM 7.7* 7.7*     Liver Panel   Recent Labs  06/11/14 2225 06/14/14 0328  PROT 6.1 5.6*  ALBUMIN 1.6* 1.5*  AST 61* 50*  ALT 26 22  ALKPHOS 125* 105  BILITOT 2.4* 2.3*       Sedimentation Rate No results for input(s): ESRSEDRATE in the last 72 hours. C-Reactive Protein No results for input(s): CRP in the last 72 hours.  Micro Results: Recent Results (from the past 720 hour(s))  MRSA PCR Screening     Status: None   Collection Time: 06/11/14  8:57 PM  Result Value Ref Range Status   MRSA by PCR NEGATIVE NEGATIVE Final    Comment:        The GeneXpert MRSA Assay (FDA approved for NASAL specimens only), is one component of a comprehensive MRSA colonization surveillance program. It is not intended to diagnose MRSA infection nor to guide or monitor treatment for MRSA infections.   Culture, blood (routine x 2)     Status: None (Preliminary result)   Collection Time: 06/11/14 10:23 PM  Result Value Ref Range Status   Specimen Description BLOOD LEFT HAND  Final   Special Requests BOTTLES DRAWN AEROBIC AND ANAEROBIC 5CC EACH  Final   Culture   Final           BLOOD CULTURE RECEIVED NO GROWTH TO DATE CULTURE WILL BE HELD FOR 5 DAYS BEFORE ISSUING A FINAL NEGATIVE REPORT Performed at [facility]    Report Status PENDING  Incomplete  Culture, blood (routine x 2)     Status: None (Preliminary result)   Collection Time: 06/11/14 10:31 PM  Result Value Ref Range Status   Specimen Description BLOOD LEFT WRIST  Final   Special Requests BOTTLES DRAWN AEROBIC AND ANAEROBIC 5CC EACH  Final   Culture   Final           BLOOD CULTURE RECEIVED NO GROWTH TO DATE CULTURE WILL BE HELD FOR 5 DAYS BEFORE ISSUING A FINAL NEGATIVE REPORT Performed at [facility]    Report Status PENDING  Incomplete    Studies/Results: Mr Thoracic Spine W Wo Contrast  06/13/2014   CLINICAL DATA:  Immunocompromised patient, status post spine surgery  November 2015 with worsening discitis osteomyelitis (T11-12, L3-4, L4-5 and S1-2). History of cirrhosis, lymphoma.  EXAM: MRI THORACIC AND LUMBAR SPINE WITHOUT AND WITH CONTRAST  TECHNIQUE: Multiplanar and multiecho pulse sequences of the thoracic and lumbar spine were obtained without and with intravenous contrast.  CONTRAST:  22mL MULTIHANCE GADOBENATE DIMEGLUMINE 529 MG/ML IV SOLN  COMPARISON:  MRI of the thoracic and lumbar spine June 11, 2014  FINDINGS: MR THORACIC SPINE FINDINGS  Motion degraded examination.  Stable moderate wedging of vertebral body T11, mild wedging of vertebral body T12 associated with mildly expansile T2 bright enhancing signal within the T11-12 disc. Linear low T1, bright T2 mild linear enhancement within vertebral body T8 nonspecific though, there is an acute appearing associated Schmorl's node. Mild chronic wedging of vertebral body T4 as previously noted. Generalized decreased bone marrow signal which can be seen with patients myeloproliferative disorder.  Thoracic spinal cord appears normal morphology and signal characteristics, however there is ventral cord deformity at T11-12 due to small amount of retropulsed bony fragments. No abnormal cord, leptomeningeal nor epidural enhancement.  Small bilateral pleural effusions. Suspected splenomegaly incompletely imaged. Tiny LEFT central T7-8 disc protrusion. Small RIGHT central T8-9 and T9-10 disc protrusions. Small LEFT central T10-11 disc protrusion. Mild canal stenosis at T11-12 due to retropulsed bony fragments.  MR LUMBAR SPINE FINDINGS  Moderately motion degraded examination. Continued counting from the thoracic spine demonstrates transitional anatomy, lumbarized S1 vertebral body as previously noted. Moderate to severe  Moderate L3-4, moderate to severe L4-5 disc height loss, associated enhancing endplate Schmorl's nodes. Bright STIR signal without superimposed intradiscal enhancement, however there is patchy enhancement within  the L3, L4 and L5 vertebra. Mild to moderate L5-S1 disc height loss, enhancing endplate changes and enhancing L5-S1 disc, status post LEFT L5-S1 hemilaminectomy. Enhancement extends through the surgical defect without focal fluid collection. Abnormal T2 bright signal and enhancement within the RIGHT iliac bone, associated with T2 bright signal within the included RIGHT gluteal muscles. Generalized decreased T1 bone marrow signal which likely represents patient's myeloproliferative disorder.  No malalignment. Conus medullaris terminates at L1-1 2. No convincing evidence of cord, leptomeningeal abnormal enhancement. Enhancing granulation tissue within the LEFT S1-2 lateral recess without focal fluid collection. Assessment is somewhat limited by moderately motion degraded axial sequences. Suspected splenomegaly.  L1-2 and L2-3: No disc bulge, canal stenosis nor neural foraminal narrowing.  L3-4: Small broad-based disc bulge, no canal stenosis or neural foraminal narrowing.  L4-5: Small broad-based disc bulge, no canal stenosis. Mild LEFT neural foraminal narrowing.  L5-S1: No disc bulge. Mild facet arthropathy and ligamentum flavum redundancy. Very mild bilateral neural foraminal narrowing.  L5-S1: Status post LEFT hemilaminectomy. No osseous canal stenosis. Partial effacement of LEFT lateral recess may affect the traversing LEFT S2 nerve. Very mild RIGHT neural foraminal narrowing.  IMPRESSION: MRI THORACIC SPINE: Motion degraded examination. 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  Ill-defined enhancing signal within T8, associated inferior endplate Schmorl's though, this may reflect stress response.  MRI LUMBAR SPINE: Motion degraded examination. Transitional anatomy, lumbarized S1 vertebral body. Status post LEFT S1-2 hemilaminectomy, chronic appearing osteomyelitis. No epidural abscess.  Enhancement within L3, L4 and L5 vertebral bodies, without suspicious  intradiscal enhancement may reflect chronic osteomyelitis/reactive changes.  Ill-defined signal in RIGHT iliac bone concerning for osteomyelitis with subjacent RIGHT gluteal myositis.   Electronically Signed   By: Elon Alas   On: 06/13/2014 01:01   Mr Lumbar Spine W Wo Contrast  06/13/2014   CLINICAL DATA:  Immunocompromised patient, status post spine surgery November 2015 with worsening discitis osteomyelitis (T11-12, L3-4, L4-5 and S1-2). History of cirrhosis, lymphoma.  EXAM: MRI THORACIC AND LUMBAR SPINE WITHOUT AND WITH CONTRAST  TECHNIQUE: Multiplanar and multiecho pulse sequences of the thoracic and lumbar spine were  obtained without and with intravenous contrast.  CONTRAST:  35mL MULTIHANCE GADOBENATE DIMEGLUMINE 529 MG/ML IV SOLN  COMPARISON:  MRI of the thoracic and lumbar spine June 11, 2014  FINDINGS: MR THORACIC SPINE FINDINGS  Motion degraded examination.  Stable moderate wedging of vertebral body T11, mild wedging of vertebral body T12 associated with mildly expansile T2 bright enhancing signal within the T11-12 disc. Linear low T1, bright T2 mild linear enhancement within vertebral body T8 nonspecific though, there is an acute appearing associated Schmorl's node. Mild chronic wedging of vertebral body T4 as previously noted. Generalized decreased bone marrow signal which can be seen with patients myeloproliferative disorder.  Thoracic spinal cord appears normal morphology and signal characteristics, however there is ventral cord deformity at T11-12 due to small amount of retropulsed bony fragments. No abnormal cord, leptomeningeal nor epidural enhancement.  Small bilateral pleural effusions. Suspected splenomegaly incompletely imaged. Tiny LEFT central T7-8 disc protrusion. Small RIGHT central T8-9 and T9-10 disc protrusions. Small LEFT central T10-11 disc protrusion. Mild canal stenosis at T11-12 due to retropulsed bony fragments.  MR LUMBAR SPINE FINDINGS  Moderately motion degraded  examination. Continued counting from the thoracic spine demonstrates transitional anatomy, lumbarized S1 vertebral body as previously noted. Moderate to severe  Moderate L3-4, moderate to severe L4-5 disc height loss, associated enhancing endplate Schmorl's nodes. Bright STIR signal without superimposed intradiscal enhancement, however there is patchy enhancement within the L3, L4 and L5 vertebra. Mild to moderate L5-S1 disc height loss, enhancing endplate changes and enhancing L5-S1 disc, status post LEFT L5-S1 hemilaminectomy. Enhancement extends through the surgical defect without focal fluid collection. Abnormal T2 bright signal and enhancement within the RIGHT iliac bone, associated with T2 bright signal within the included RIGHT gluteal muscles. Generalized decreased T1 bone marrow signal which likely represents patient's myeloproliferative disorder.  No malalignment. Conus medullaris terminates at L1-1 2. No convincing evidence of cord, leptomeningeal abnormal enhancement. Enhancing granulation tissue within the LEFT S1-2 lateral recess without focal fluid collection. Assessment is somewhat limited by moderately motion degraded axial sequences. Suspected splenomegaly.  L1-2 and L2-3: No disc bulge, canal stenosis nor neural foraminal narrowing.  L3-4: Small broad-based disc bulge, no canal stenosis or neural foraminal narrowing.  L4-5: Small broad-based disc bulge, no canal stenosis. Mild LEFT neural foraminal narrowing.  L5-S1: No disc bulge. Mild facet arthropathy and ligamentum flavum redundancy. Very mild bilateral neural foraminal narrowing.  L5-S1: Status post LEFT hemilaminectomy. No osseous canal stenosis. Partial effacement of LEFT lateral recess may affect the traversing LEFT S2 nerve. Very mild RIGHT neural foraminal narrowing.  IMPRESSION: MRI THORACIC SPINE: Motion degraded examination. 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  Ill-defined  enhancing signal within T8, associated inferior endplate Schmorl's though, this may reflect stress response.  MRI LUMBAR SPINE: Motion degraded examination. Transitional anatomy, lumbarized S1 vertebral body. Status post LEFT S1-2 hemilaminectomy, chronic appearing osteomyelitis. No epidural abscess.  Enhancement within L3, L4 and L5 vertebral bodies, without suspicious intradiscal enhancement may reflect chronic osteomyelitis/reactive changes.  Ill-defined signal in RIGHT iliac bone concerning for osteomyelitis with subjacent RIGHT gluteal myositis.   Electronically Signed   By: Elon Alas   On: 06/13/2014 01:01                    Assessment/Plan:  Principal Problem:   Acute encephalopathy Active Problems:   AIDS   Cirrhosis, non-alcoholic   Pancytopenia   Lumbar discitis   Thoracic discitis   Enterococcal bacteremia   Vancomycin  resistant Enterococcus   Candida albicans infection   HIV disease   HCAP (healthcare-associated pneumonia)   Sepsis due to other etiology   Iliac crest osteosclerosis    Samuel Schultz is a 39 y.o. male with  Samuel Schultz is a 39 y.o. 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 We took him off antibiotics for a week prior to surgery and he is now sp L5-S1 microdiscectomy with nerve root discetomy and biopsies mx for culture by Dr. Saintclair Halsted. Operative cultures failed to grow any bacterial organisms or fungal cultures at 4 weeks if the  cultures are still pending. He was on placed on micafungin and vancomycin has remained on these for 5 weeks then vancomycin and high fluconazole but with worsening back pain prior even to the switch. MRI now shows progressive discitis at the L3 L4 region collapse of vertebra at T11-12 and questionable ventral epidural abscess near the surgical site. MRI with contrast shows similar picture though motion degraded and also shows finding in iliac bone on the right suggestive of osteomyelitis  #1 Progressive discitis by MR imaging in the L 3 L4 region with question of ventral epidural abscess at Sacral area  --Dr. Kary Kos to review images  --i took HIM OFF OF VANCOMYCIN AND CEFEPIME IN CASE WE [place]   #2 Prior RIght hip synovitis now with ? Osteo on MRI of the ILIAC  --ordered  MRI bilateral hips with contrast tomorrow to look at ILIAC and hips --ordered some xanax oral premed  #3 HIV continue his current regimen is quite well controlled  #4 Sirs fevers and confusion: is this due to worsening osteo or diskitis? He is not  bacteremic   LOS: 3 days   Alcide Evener 06/14/2014, 11:54 AM

## 2014-06-14 NOTE — Progress Notes (Signed)
[place] TEAM 1 - Stepdown/ICU TEAM Progress Note  Samuel Schultz RFV:[id number] DOB: 01-Jul-1975 DOA: 06/11/2014 PCP: [name], MD  Admit HPI / Brief Narrative: Samuel Schultz is a 39 y.o. 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 Patient also was found to be hypoxic and mildly febrile in the ER. Chest x-ray did not show any definite infiltrates. Patient has chronic indwelling PICC line. Patient was complaining of increasing back pain and MRI shows worsening discitis. Patient has been transferred to [facility] for further management. On exam patient is lethargic but answering questions appropriately. Denies any incontinence of urine or bowels. Denies any chest pain or shortness of breath. Patient's blood pressure is around 90 systolic and not tachycardic. Patient has been admitted for further management of mental status changes which could be from possible pneumonia or PICC line infection.   HPI/Subjective: 1/7 A/O 4, having moderate to severe pain in his mid to lower back, which he states waxes and wanes. Patient currently able to lay on his back which he was unable to do 48 hours ago.    Assessment/Plan: Acute encephalopathy  -Resolved - Per admission note ID Dr. Megan Salon. Recommended that developing pneumonia and also possible PICC line infection cause of encephalopathy.  -Per ID will continue vancomycin,Fluconazole, and add Cefepime. -1/5 ID added azithromycin  -Ammonia level 30  Sepsis/HCAP/ PCP pneumonia?/bacteremia -Patient met criteria of sepsis with WBC<4 + HR>90 -See acute encephalopathy -See chronic back pain  Progressive chronic discitis L3-L4 region with question of ventral epidural abscess at sacral area -Spoke  with Dr. Kary Kos  (neurosurgery) who reviewed [place] MRI with me, stated there is no further surgical intervention recommended. However would obtain T-spine and L-spine MRI with contrast and upon completion, stated would be glad to review findings over the phone -Ordered TLSO for patient, although states has one at his rehabilitation center  ?osteomyelitis of R hip For MRI 1/7 to better evaluate; states will refuse counseled patient on the importance of obtaining the hip MRI, however patient does have the right to refuse study.  AIDS -last CD4 count around 50 but per Dr. Megan Salon patient's viral load was undetectable  -See acute cephalopathy/sepsis  Pancytopenia -Most likely multifactorial to include HIV or cirrhosis. 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 -Ammonia level 77, repeat in a.m. -Lactulose 30 g daily     Code Status: FULL Family Communication: No family present at time of exam Disposition Plan: Resolution sepsis    Consultants: Dr. Kary Kos  (neurosurgery) telephone consult [place] (ID)   Procedure/Significant Events: 1/4 MRI T-spine/L-spine without contrast at [place]; T11-12 discitis with slight further collapse of the endplates. Encroachment upon the ventral aspect the spinal canal -no compression of the cord or epidural abscess. -Progressive discitis  L3-4 level. -No collapse or epidural abscess. 1/6 T-spine/L-spine MRI with and without contrast; Chronic appearing T11-12 discitis osteomyelitis without significant soft tissue component. -Ill-defined enhancing signal within T8, associated inferior endplate Schmorl's -S/P LEFT S1-2 hemilaminectomy,chronic appearing osteomyelitis. No epidural abscess. -Enhancement within L3, L4 and L5 vertebral bodies, reflect chronic osteomyelitis/reactive changes. -Ill-defined signal in  RIGHT iliac bone concerning for osteomyelitis with subjacent RIGHT gluteal myositis.  Culture 1/4 blood left hand/wrist  NGTD 1/4 MRSA by PCR negative   Antibiotics/antiretrovirals: Cefepime 1/4>> Fluconazole 1/4>> Azithromycin 1/5>> Vancomycin 1/5  Atovaquone 1/4>> Prezista 1/4>> Truvada 1/4   DVT prophylaxis: SCD   Devices   LINES / TUBES:      Continuous Infusions:    Objective: VITAL SIGNS: Temp: 98.4 F (36.9 C) (01/07 0951) Temp Source: Oral (01/07 0951) BP: 103/50 mmHg (01/07 0951) Pulse Rate: 101 (01/07 0951) SPO2; FIO2:   Intake/Output Summary (Last 24 hours) at 06/14/14 1142 Last data filed at 06/14/14 0800  Gross per 24 hour  Intake    654 ml  Output    825 ml  Net   -171 ml     Exam: General: A/O 4, moderate to severe pain mid to lower back (decreased from 48 hours previous), No acute respiratory distress Lungs: Clear to auscultation bilaterally without wheezes or crackles Cardiovascular: Regular rate and rhythm without murmur gallop or rub normal S1 and S2 Abdomen: Nontender, nondistended, soft, bowel sounds positive, no rebound, no ascites, no appreciable mass Extremities: No significant cyanosis, clubbing, or edema bilateral lower extremities; pain to palpation in the mid to lower back area.  Data Reviewed: Basic Metabolic Panel:  Recent Labs Lab 06/11/14 2225 06/12/14 0352 06/14/14 0328  NA 128* 131* 130*  K 3.7 3.9 3.7  CL 106 107 105  CO2 20 21 22   GLUCOSE 101* 91 78  BUN 6 <5* 7  CREATININE 0.55 0.58 0.54  CALCIUM 7.4* 7.7* 7.7*   Liver Function Tests:  Recent Labs Lab 06/11/14 2225 06/14/14 0328  AST 61* 50*  ALT 26 22  ALKPHOS 125* 105  BILITOT 2.4* 2.3*  PROT 6.1 5.6*  ALBUMIN 1.6* 1.5*   No results for input(s): LIPASE, AMYLASE in the last 168 hours.  Recent Labs Lab 06/11/14 2225 06/13/14 0500 06/14/14 0328  AMMONIA 77* 64* 30   CBC:  Recent Labs Lab 06/11/14 2225 06/12/14 0352  06/14/14 0328  WBC 2.6* 3.1* 2.0*  NEUTROABS 2.0  --   --   HGB 7.9* 8.2* 6.9*  HCT 23.5* 25.2* 20.8*  MCV 98.7 98.8 100.5*  PLT 63* 82* 72*   Cardiac Enzymes: No results for input(s): CKTOTAL, CKMB, CKMBINDEX, TROPONINI in the last 168 hours. BNP (last 3 results) No results for input(s): PROBNP in the last 8760 hours. CBG:  Recent Labs Lab 06/12/14 06/12/14 0511 06/13/14 1257  GLUCAP 88 105* 121*    Recent Results (from the past 240 hour(s))  MRSA PCR Screening     Status: None   Collection Time: 06/11/14  8:57 PM  Result Value Ref Range Status   MRSA by PCR NEGATIVE NEGATIVE Final    Comment:        The GeneXpert MRSA Assay (FDA approved for NASAL specimens only), is one component of a comprehensive MRSA colonization surveillance program. It is not intended to diagnose MRSA infection nor to guide or monitor treatment for MRSA infections.   Culture, blood (routine x 2)     Status: None (Preliminary result)   Collection Time: 06/11/14 10:23 PM  Result Value Ref Range Status   Specimen Description BLOOD LEFT HAND  Final   Special Requests BOTTLES DRAWN AEROBIC AND ANAEROBIC 5CC EACH  Final   Culture   Final           BLOOD CULTURE RECEIVED NO GROWTH TO DATE CULTURE WILL BE HELD FOR 5 DAYS BEFORE ISSUING A FINAL NEGATIVE REPORT Performed at [facility]  Report Status PENDING  Incomplete  Culture, blood (routine x 2)     Status: None (Preliminary result)   Collection Time: 06/11/14 10:31 PM  Result Value Ref Range Status   Specimen Description BLOOD LEFT WRIST  Final   Special Requests BOTTLES DRAWN AEROBIC AND ANAEROBIC 5CC EACH  Final   Culture   Final           BLOOD CULTURE RECEIVED NO GROWTH TO DATE CULTURE WILL BE HELD FOR 5 DAYS BEFORE ISSUING A FINAL NEGATIVE REPORT Performed at [facility]    Report Status PENDING  Incomplete     Studies:  Recent x-ray studies have been reviewed in detail by the Attending Physician  Scheduled  Meds:  Scheduled Meds: . antiseptic oral rinse  7 mL Mouth Rinse BID  . atovaquone  750 mg Oral Daily  . azithromycin  1,200 mg Oral Weekly  . buPROPion  300 mg Oral Daily  . Darunavir Ethanolate  800 mg Oral Daily  . docusate sodium  100 mg Oral BID  . emtricitabine-tenofovir  1 tablet Oral QHS  . feeding supplement (RESOURCE BREEZE)  1 Container Oral TID BM  . ferrous sulfate  325 mg Oral BID WC  . fluconazole  400 mg Oral Daily  . lactulose  10 g Oral BID  . morphine  60 mg Oral Q12H  . pantoprazole  40 mg Oral Daily  . polyethylene glycol  17 g Oral Daily  . ritonavir  100 mg Oral Q breakfast  . senna-docusate  2 tablet Oral Daily    Time spent on care of this patient: 40 mins   Allie Bossier , MD   [facility] Office  [phone] Pager - [phone]  On-Call/Text Page:      [email or web address]      password [email or web address]  If 7PM-7AM, please contact night-coverage [email or web address] Password [email or web address] 06/14/2014, 11:42 AM   LOS: 3 days

## 2014-06-15 ENCOUNTER — Inpatient Hospital Stay (HOSPITAL_COMMUNITY): Payer: Medicaid Other

## 2014-06-15 DIAGNOSIS — L0291 Cutaneous abscess, unspecified: Secondary | ICD-10-CM | POA: Insufficient documentation

## 2014-06-15 DIAGNOSIS — R6510 Systemic inflammatory response syndrome (SIRS) of non-infectious origin without acute organ dysfunction: Secondary | ICD-10-CM

## 2014-06-15 LAB — TYPE AND SCREEN
ABO/RH(D): A POS
Antibody Screen: NEGATIVE
UNIT DIVISION: 0

## 2014-06-15 MED ORDER — GADOBENATE DIMEGLUMINE 529 MG/ML IV SOLN: 10.0000 mL | Freq: Once | INTRAVENOUS | Status: AC | PRN

## 2014-06-15 NOTE — Progress Notes (Addendum)
[facility] for Infectious Disease    Subjective: A she refused MRI yesterday he apparently attempted to do the MRI this morning but I talked to the technician and the patient was not able to complete the study due to discomfort plan was to repeat attempts later today or tomorrow   Antibiotics:  Anti-infectives    Start     Dose/Rate Route Frequency Ordered Stop   06/13/14 1700  vancomycin (VANCOCIN) IVPB 1000 mg/200 mL premix  Status:  Discontinued     1,000 mg200 mL/hr over 60 Minutes Intravenous Every 8 hours 06/13/14 1640 06/13/14 1644   06/12/14 1000  azithromycin (ZITHROMAX) tablet 1,200 mg     1,200 mg Oral Weekly 06/11/14 2201     06/12/14 0700  ritonavir (NORVIR) capsule 100 mg     100 mg Oral Daily with breakfast 06/11/14 2201     06/12/14 0000  vancomycin (VANCOCIN) IVPB 750 mg/150 ml premix  Status:  Discontinued     750 mg150 mL/hr over 60 Minutes Intravenous Every 8 hours 06/11/14 2236 06/13/14 1559   06/11/14 2300  ceFEPIme (MAXIPIME) 1 g in dextrose 5 % 50 mL IVPB  Status:  Discontinued     1 g100 mL/hr over 30 Minutes Intravenous 3 times per day 06/11/14 2236 06/13/14 1559   06/11/14 2215  fluconazole (DIFLUCAN) tablet 400 mg     400 mg Oral Daily 06/11/14 2201     06/11/14 2215  vancomycin (VANCOCIN) 1,250 mg in sodium chloride 0.9 % 250 mL IVPB  Status:  Discontinued     1,250 mg166.7 mL/hr over 90 Minutes Intravenous Every 8 hours 06/11/14 2201 06/11/14 2235   06/11/14 2215  atovaquone (MEPRON) 750 MG/5ML suspension 750 mg     750 mg Oral Daily 06/11/14 2201     06/11/14 2215  Darunavir Ethanolate (PREZISTA) tablet 800 mg     800 mg Oral Daily 06/11/14 2201     06/11/14 2215  emtricitabine-tenofovir (TRUVADA) 200-300 MG per tablet 1 tablet     1 tablet Oral Daily at bedtime 06/11/14 2201        Medications: Scheduled Meds: . antiseptic oral rinse  7 mL Mouth Rinse BID  . atovaquone  750 mg Oral Daily  . azithromycin  1,200 mg Oral Weekly  . buPROPion   300 mg Oral Daily  . Darunavir Ethanolate  800 mg Oral Daily  . docusate sodium  100 mg Oral BID  . emtricitabine-tenofovir  1 tablet Oral QHS  . feeding supplement (RESOURCE BREEZE)  1 Container Oral TID BM  . ferrous sulfate  325 mg Oral BID WC  . fluconazole  400 mg Oral Daily  . lactulose  10 g Oral BID  . morphine  60 mg Oral Q12H  . pantoprazole  40 mg Oral Daily  . polyethylene glycol  17 g Oral Daily  . ritonavir  100 mg Oral Q breakfast  . senna-docusate  2 tablet Oral Daily   Continuous Infusions:  PRN Meds:.acetaminophen **OR** acetaminophen, ALPRAZolam, antiseptic oral rinse, clonazePAM, gadobenate dimeglumine, LORazepam, menthol-cetylpyridinium, methocarbamol, morphine, ondansetron **OR** ondansetron (ZOFRAN) IV, sucralfate    Objective: Weight change:   Intake/Output Summary (Last 24 hours) at 06/15/14 1612 Last data filed at 06/15/14 1113  Gross per 24 hour  Intake    600 ml  Output   1475 ml  Net   -875 ml   Blood pressure 112/51, pulse 96, temperature 98.7 F (37.1 C), temperature source Oral, resp. rate 27, height 5'  9" (1.753 m), weight 166 lb 0.1 oz (75.3 kg), SpO2 92 %. Temp:  [98 F (36.7 C)-99.6 F (37.6 C)] 98.7 F (37.1 C) (01/08 1503) Pulse Rate:  [91-104] 96 (01/08 1503) Resp:  [15-27] 27 (01/08 1113) BP: (94-115)/(48-61) 112/51 mmHg (01/08 1503) SpO2:  [89 %-100 %] 92 % (01/08 1503)  Physical Exam: General: Slumped over in bed on his side speaking in very soft voice oriented to place and person and location HEENT: , EOMI,  CVS regular rate, normal r, no murmur rubs or gallops Chest: no wheezing, rales or rhonchi Abdomen: soft nondistended,  Extremities, MSK: tender to palpation T and L spine areas Neuro: nonfocal, strength and sensation intact CBC:  CBC Latest Ref Rng 06/14/2014 06/14/2014 06/12/2014  WBC 4.0 - 10.5 K/uL - 2.0(L) 3.1(L)  Hemoglobin 13.0 - 17.0 g/dL 8.1(L) 6.9(LL) 8.2(L)  Hematocrit 39.0 - 52.0 % 23.9(L) 20.8(L) 25.2(L)    Platelets 150 - 400 K/uL - 72(L) 82(L)      BMET  Recent Labs  06/14/14 0328  NA 130*  K 3.7  CL 105  CO2 22  GLUCOSE 78  BUN 7  CREATININE 0.54  CALCIUM 7.7*     Liver Panel   Recent Labs  06/14/14 0328  PROT 5.6*  ALBUMIN 1.5*  AST 50*  ALT 22  ALKPHOS 105  BILITOT 2.3*       Sedimentation Rate No results for input(s): ESRSEDRATE in the last 72 hours. C-Reactive Protein No results for input(s): CRP in the last 72 hours.  Micro Results: Recent Results (from the past 720 hour(s))  MRSA PCR Screening     Status: None   Collection Time: 06/11/14  8:57 PM  Result Value Ref Range Status   MRSA by PCR NEGATIVE NEGATIVE Final    Comment:        The GeneXpert MRSA Assay (FDA approved for NASAL specimens only), is one component of a comprehensive MRSA colonization surveillance program. It is not intended to diagnose MRSA infection nor to guide or monitor treatment for MRSA infections.   Culture, blood (routine x 2)     Status: None (Preliminary result)   Collection Time: 06/11/14 10:23 PM  Result Value Ref Range Status   Specimen Description BLOOD LEFT HAND  Final   Special Requests BOTTLES DRAWN AEROBIC AND ANAEROBIC 5CC EACH  Final   Culture   Final           BLOOD CULTURE RECEIVED NO GROWTH TO DATE CULTURE WILL BE HELD FOR 5 DAYS BEFORE ISSUING A FINAL NEGATIVE REPORT Performed at [facility]    Report Status PENDING  Incomplete  Culture, blood (routine x 2)     Status: None (Preliminary result)   Collection Time: 06/11/14 10:31 PM  Result Value Ref Range Status   Specimen Description BLOOD LEFT WRIST  Final   Special Requests BOTTLES DRAWN AEROBIC AND ANAEROBIC 5CC EACH  Final   Culture   Final           BLOOD CULTURE RECEIVED NO GROWTH TO DATE CULTURE WILL BE HELD FOR 5 DAYS BEFORE ISSUING A FINAL NEGATIVE REPORT Performed at [facility]    Report Status PENDING  Incomplete    Studies/Results: No results  found.                  Assessment/Plan:  Principal Problem:   Acute encephalopathy Active Problems:   AIDS   Cirrhosis, non-alcoholic   Pancytopenia   Lumbar discitis  Thoracic discitis   Enterococcal bacteremia   Vancomycin resistant Enterococcus   Candida albicans infection   HIV disease   HCAP (healthcare-associated pneumonia)   Sepsis due to other etiology   Iliac crest osteosclerosis   Osteomyelitis    Samuel Schultz is a 39 y.o. male with  Samuel Schultz is a 39 y.o. 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 We took him off antibiotics for a week prior to surgery and he is now sp L5-S1 microdiscectomy with nerve root discetomy and biopsies mx for culture by Dr. Saintclair Halsted. Operative cultures failed to grow any bacterial organisms or fungal cultures at 4 weeks if the cultures are still pending. He was on placed on micafungin and vancomycin has remained on these for 5 weeks then vancomycin and high fluconazole but with worsening back pain prior even to the switch. MRI without contrast showed progressive discitis at the L3 L4 region collapse of vertebra at T11-12 and questionable ventral epidural thickening BUT NOT ABSCESS near surgical sitein sacral region. MRI with contrast read albeit with motion degradation as showing enhancement felt to be more c/w chronic osteomyelitis. and also shows finding in iliac bone on the right suggestive of osteomyelitis  #1 Discitis  by MR imaging in the L 3 L4 , L5, not clearly acute by MRI WITH contrast as opposed to WITHOUT. I WANT TO CORRECT MIS-INTERPRETATION I MADE YESTERDAY IN THAT THE MRI WITHOUT CONTRAST WHILE SHOWNG VENTRAL EPIDURAL THICKENING DID NOT SHOW AN ABSCESS  --Dr. Kary Kos to review images  --i took HIM OFF OF VANCOMYCIN AND CEFEPIME IN CASE WE HAVE A TARGET FOR CULTURE FOR IR OR SURGERY  I spent greater than 40 minutes with the patient including greater than 50% of time in face to face counsel of the patient and in coordination of their care.   #2 Prior RIght hip synovitis now with ? Osteo on MRI of the ILIAC, He refused MRI yesterday and today was not able to complete the exam  --we will see if he can get  MRI bilateral hips with contrast done to look at ILIAC and hips --ordered some xanax oral premed  #3 HIV continue his current regimen is quite well controlled  #4 Sirs fevers and confusion: is this due to worsening osteo or diskitis? He is not  Bacteremic and he is now afebrile.   ADDENDUM  NOTE I SPOKE WITH HIS "GOD MOTHER" WHO IS HIS HCPOA  SHE IS COMING TO TALK TO THE PT TONIGHT AND TRY TO CONVINCE HIM TO GO THRU THE STUDY  SHE FEELS THAT THE BEST WAY TO GET GOOD IMAGES WILL BE TO OPTIMIZE HIS PAIN CONTROL DURING THE PROCEDURE  I WONDER IF THIS CAN BE DONE WITH CURREN NARCOTICS OR WHETHER HE WOULD REQUIRE SOME DEGREE OF ANETHESIA (HE DID IN THE PAST ON ONE OCCASION PER GOD MOTHER SH  Dr. Megan Salon available on the weekend for questions.    LOS: 4 days   Alcide Evener 06/15/2014, 4:12 PM

## 2014-06-15 NOTE — Progress Notes (Signed)
[place] TEAM 1 - Stepdown/ICU TEAM Progress Note  Samuel Schultz OLM:[id number] DOB: 1976-01-25 DOA: 06/11/2014 PCP: [name], MD  Admit HPI / Brief Narrative: 39 y.o. 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 Patient also was found to be hypoxic and mildly febrile in the ER. Chest x-ray did not show any definite infiltrates. Patient has chronic indwelling PICC line. Patient was complaining of increasing back pain and initial MRI suggested worsening discitis. Patient was transferred to [facility] for further management.   HPI/Subjective: Pt reports majority of pain at this time is focused about his R hip/pelvic region.  Denies new neurologic deficits at this time.  Denies cp, sob, n/v, or abdom pain .    Assessment/Plan:  Acute encephalopathy - resolved -resolved - felt to be due to infection (chronic osteo, possible PICC infection) and hepatic enceph - ammonia now normal    SIRS v/s Sepsis of unclear etiology - ?osteomyelitis - ?PICC infection  -Patient met criteria for SIRS with WBC<4 + HR>90 - abx tx per ID - no evidence of bacteremia at this time - ?reamins of possible R pelvic osteo  Chronic discitis T11-12 and L3-L5 region  - ID communicating further w/ Dr. Saintclair Halsted prn - initial T-spine and L-spine MRI suggested progression of disease w/ possible ventral epidural abscess - repeat MRI WITH CONTRAST however suggested stable findings in area compared to prior studies - ID has stopped abx for now   ?osteomyelitis of R hip Refused MRI on multiple occasions 1/7 - rescheduled for 1/8 - of note MRI Nov 12,2015 suggested possible septic arthritis in this region   AIDS -last CD4 count around 50 but per Dr. 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 Kary Kos  (neurosurgery) telephone consult Dr.Cornelius Tommy Medal (ID)  Procedures: none  Antibiotics: Cefepime 1/4 > 1/6 Vancomycin 1/4 > 1/6 Fluconazole 1/5 >  DVT prophylaxis: SCDs  Objective: Blood pressure 115/55, pulse 101, temperature 99.6 F (37.6 C), temperature source Oral, resp. rate 19, height 5' 9" (1.753 m), weight 75.3 kg (166 lb 0.1 oz), SpO2 94 %.  Intake/Output Summary (Last 24 hours) at 06/15/14 1005 Last data filed at 06/15/14 0800  Gross per 24 hour  Intake    600 ml  Output   1200 ml  Net   -600 ml   Exam: General: No acute respiratory distress - alert and conversant  Lungs: Clear to auscultation bilaterally without wheezes or crackles Cardiovascular: Regular rate and rhythm without murmur gallop or rub  Abdomen: Nontender, nondistended, soft, bowel sounds positive, no rebound, no ascites, no appreciable mass Extremities: No significant cyanosis, clubbing, or edema bilateral lower extremities - R hip and pelvic region w/o external findings on exam and  not tender to touch  Data Reviewed: Basic Metabolic Panel:  Recent Labs Lab 06/11/14 2225 06/12/14 0352 06/14/14 0328  NA 128* 131* 130*  K 3.7 3.9 3.7  CL 106 107 105  CO2 20 21 22  GLUCOSE 101* 91 78  BUN 6 <5* 7  CREATININE 0.55 0.58 0.54  CALCIUM 7.4* 7.7* 7.7*   Liver Function Tests:  Recent Labs Lab 06/11/14 2225  06/14/14 0328  AST 61* 50*  ALT 26 22  ALKPHOS 125* 105  BILITOT 2.4* 2.3*  PROT 6.1 5.6*  ALBUMIN 1.6* 1.5*    Recent Labs Lab 06/11/14 2225 06/13/14 0500 06/14/14 0328  AMMONIA 77* 64* 30   CBC:  Recent Labs Lab 06/11/14 2225 06/12/14 0352 06/14/14 0328 06/14/14 1222  WBC 2.6* 3.1* 2.0*  --   NEUTROABS 2.0  --   --   --   HGB 7.9* 8.2* 6.9* 8.1*  HCT 23.5* 25.2* 20.8* 23.9*  MCV 98.7 98.8 100.5*  --   PLT 63* 82* 72*  --    CBG:  Recent Labs Lab 06/12/14 06/12/14 0511 06/13/14 1257  GLUCAP 88 105* 121*    Recent Results (from the past 240 hour(s))  MRSA PCR Screening     Status: None   Collection Time: 06/11/14  8:57 PM  Result Value Ref Range Status   MRSA by PCR NEGATIVE NEGATIVE Final    Comment:        The GeneXpert MRSA Assay (FDA approved for NASAL specimens only), is one component of a comprehensive MRSA colonization surveillance program. It is not intended to diagnose MRSA infection nor to guide or monitor treatment for MRSA infections.   Culture, blood (routine x 2)     Status: None (Preliminary result)   Collection Time: 06/11/14 10:23 PM  Result Value Ref Range Status   Specimen Description BLOOD LEFT HAND  Final   Special Requests BOTTLES DRAWN AEROBIC AND ANAEROBIC 5CC EACH  Final   Culture   Final           BLOOD CULTURE RECEIVED NO GROWTH TO DATE CULTURE WILL BE HELD FOR 5 DAYS BEFORE ISSUING A FINAL NEGATIVE REPORT Performed at [facility]    Report Status PENDING  Incomplete  Culture, blood (routine x 2)     Status: None (Preliminary result)   Collection Time: 06/11/14 10:31 PM  Result Value Ref Range Status   Specimen Description BLOOD LEFT WRIST  Final   Special Requests BOTTLES DRAWN AEROBIC AND ANAEROBIC 5CC EACH  Final   Culture   Final           BLOOD CULTURE RECEIVED NO GROWTH TO DATE CULTURE WILL BE HELD FOR 5 DAYS BEFORE ISSUING A FINAL NEGATIVE REPORT Performed at [facility]    Report Status  PENDING  Incomplete     Studies:  Recent x-ray studies have been reviewed in detail by the Attending Physician  Scheduled Meds:  Scheduled Meds: . antiseptic oral rinse  7 mL Mouth Rinse BID  . atovaquone  750 mg Oral Daily  . azithromycin  1,200 mg Oral Weekly  . buPROPion  300 mg Oral Daily  . Darunavir Ethanolate  800 mg Oral Daily  . docusate sodium  100 mg Oral BID  . emtricitabine-tenofovir  1 tablet Oral QHS  . feeding supplement (RESOURCE BREEZE)  1 Container Oral TID BM  . ferrous sulfate  325 mg Oral BID WC  . fluconazole  400 mg Oral Daily  . lactulose    10 g Oral BID  . morphine  60 mg Oral Q12H  . pantoprazole  40 mg Oral Daily  . polyethylene glycol  17 g Oral Daily  . ritonavir  100 mg Oral Q breakfast  . senna-docusate  2 tablet Oral Daily    Time spent on care of this patient: 35 mins  Cherene Altes, MD [facility] For Consults/Admissions - Flow Manager - [phone] Office  [phone]  Contact MD directly via text page:      [email or web address]      password [facility]  06/15/2014, 10:05 AM   LOS: 4 days

## 2014-06-15 NOTE — Clinical Social Work Note (Signed)
CSW reviewed chart. Once medically stable the pt can transition back to [facility]. CSW will continue to support and assist with discharge needs.   [place], MSW, [place]

## 2014-06-15 NOTE — Progress Notes (Signed)
Report called to rn, pt transferring to 5N12 via bed with belongings.

## 2014-06-16 ENCOUNTER — Inpatient Hospital Stay (HOSPITAL_COMMUNITY): Payer: Medicaid Other

## 2014-06-16 DIAGNOSIS — Q782 Osteopetrosis: Secondary | ICD-10-CM

## 2014-06-16 LAB — COMPREHENSIVE METABOLIC PANEL
ALT: 20 U/L (ref 0–53)
AST: 49 U/L — ABNORMAL HIGH (ref 0–37)
Albumin: 1.6 g/dL — ABNORMAL LOW (ref 3.5–5.2)
Alkaline Phosphatase: 106 U/L (ref 39–117)
Anion gap: 5 (ref 5–15)
BUN: 5 mg/dL — ABNORMAL LOW (ref 6–23)
CHLORIDE: 105 meq/L (ref 96–112)
CO2: 22 mmol/L (ref 19–32)
Calcium: 7.6 mg/dL — ABNORMAL LOW (ref 8.4–10.5)
Creatinine, Ser: 0.52 mg/dL (ref 0.50–1.35)
GFR calc Af Amer: >90 mL/min (ref >90)
Glucose, Bld: 74 mg/dL (ref 70–99)
POTASSIUM: 3.7 mmol/L (ref 3.5–5.1)
SODIUM: 132 mmol/L — AB (ref 135–145)
Total Bilirubin: 2.1 mg/dL — ABNORMAL HIGH (ref 0.3–1.2)
Total Protein: 5.9 g/dL — ABNORMAL LOW (ref 6.0–8.3)

## 2014-06-16 LAB — CBC
HCT: 24.8 % — ABNORMAL LOW (ref 39.0–52.0)
Hemoglobin: 8 g/dL — ABNORMAL LOW (ref 13.0–17.0)
MCH: 32.3 pg (ref 26.0–34.0)
MCHC: 32.3 g/dL (ref 30.0–36.0)
MCV: 100 fL (ref 78.0–100.0)
PLATELETS: 73 10*3/uL — AB (ref 150–400)
RBC: 2.48 MIL/uL — ABNORMAL LOW (ref 4.22–5.81)
RDW: 19.7 % — AB (ref 11.5–15.5)
WBC: 2 10*3/uL — ABNORMAL LOW (ref 4.0–10.5)

## 2014-06-16 LAB — CULTURE, BLOOD (SINGLE)

## 2014-06-16 MED ORDER — GADOBENATE DIMEGLUMINE 529 MG/ML IV SOLN
15.0000 mL | Freq: Once | INTRAVENOUS | Status: AC | PRN
  Administered 2014-06-16: 15 mL via INTRAVENOUS

## 2014-06-16 NOTE — Progress Notes (Signed)
[place] TEAM 1 - Stepdown/ICU TEAM Progress Note  Samuel Schultz XJO:[id number] DOB: Oct 29, 1975 DOA: 06/11/2014 PCP: [name], MD  Admit HPI / Brief Narrative: 39 y.o. 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 Patient also was found to be hypoxic and mildly febrile in the ER. Chest x-ray did not show any definite infiltrates. Patient has chronic indwelling PICC line. Patient was complaining of increasing back pain and initial MRI suggested worsening discitis. Patient was transferred to [facility] for further management.   HPI/Subjective: Pain currently controlled. No other complaints.     Assessment/Plan:  Acute encephalopathy - resolved -resolved - felt to be due to infection (chronic osteo, possible PICC infection) and hepatic enceph - ammonia now normal    SIRS v/s Sepsis of unclear etiology - ?osteomyelitis - ?PICC infection  -Patient met criteria for SIRS with WBC<4 + HR>90 - abx tx per ID - no evidence of bacteremia at this time -   Chronic discitis T11-12 and L3-L5 region  - ID communicating further w/ Dr. Saintclair Halsted prn - initial T-spine and L-spine MRI suggested progression of disease w/ possible ventral epidural abscess - repeat MRI WITH CONTRAST however suggested stable findings in area compared to prior studies -   ?osteomyelitis of R hip Refused MRI on multiple occasions 1/7 - rescheduled for 1/8 - of note MRI Nov 12,2015 suggested possible septic arthritis in this region  - antibiotic management by ID - currently off antibiotics   AIDS -last CD4 count around 50 but per Dr. 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 Kary Kos  (neurosurgery) telephone consult Dr.Cornelius Tommy Medal (ID)  Procedures: none  Antibiotics: Cefepime 1/4 > 1/6 Vancomycin 1/4 > 1/6 Fluconazole 1/5 >  DVT prophylaxis: SCDs  Objective: Blood pressure 92/51, pulse 88, temperature 98.7 F (37.1 C), temperature source Oral, resp. rate 20, height 5' 9"  (1.753 m), weight 75.3 kg (166 lb 0.1 oz), SpO2 97 %.  Intake/Output Summary (Last 24 hours) at 06/16/14 1231 Last data filed at 06/16/14 0514  Gross per 24 hour  Intake    240 ml  Output    600 ml  Net   -360 ml   Exam: General: No acute respiratory distress - alert and conversant  Lungs: Clear to auscultation bilaterally without wheezes or crackles Cardiovascular: Regular rate and rhythm without murmur gallop or rub  Abdomen: Nontender, nondistended, soft, bowel sounds positive, no rebound, no ascites, no appreciable mass Extremities: No significant cyanosis, clubbing, or edema bilateral lower extremities - R hip and pelvic region w/o external findings on exam and not tender to touch  Data Reviewed: Basic Metabolic Panel:  Recent Labs Lab 06/11/14 2225 06/12/14 0352 06/14/14 0328 06/16/14 0414  NA 128*  131* 130* 132*  K 3.7 3.9 3.7 3.7  CL 106 107 105 105  CO2 20 21 22 22   GLUCOSE 101* 91 78 74  BUN 6 <5* 7 5*  CREATININE 0.55 0.58 0.54 0.52  CALCIUM 7.4* 7.7* 7.7* 7.6*   Liver Function Tests:  Recent Labs Lab 06/11/14 2225 06/14/14 0328 06/16/14 0414  AST 61* 50* 49*  ALT 26 22 20   ALKPHOS 125* 105 106    BILITOT 2.4* 2.3* 2.1*  PROT 6.1 5.6* 5.9*  ALBUMIN 1.6* 1.5* 1.6*    Recent Labs Lab 06/11/14 2225 06/13/14 0500 06/14/14 0328  AMMONIA 77* 64* 30   CBC:  Recent Labs Lab 06/11/14 2225 06/12/14 0352 06/14/14 0328 06/14/14 1222 06/16/14 0414  WBC 2.6* 3.1* 2.0*  --  2.0*  NEUTROABS 2.0  --   --   --   --   HGB 7.9* 8.2* 6.9* 8.1* 8.0*  HCT 23.5* 25.2* 20.8* 23.9* 24.8*  MCV 98.7 98.8 100.5*  --  100.0  PLT 63* 82* 72*  --  73*   CBG:  Recent Labs Lab 06/12/14 06/12/14 0511 06/13/14 1257  GLUCAP 88 105* 121*    Recent Results (from the past 240 hour(s))  MRSA PCR Screening     Status: None   Collection Time: 06/11/14  8:57 PM  Result Value Ref Range Status   MRSA by PCR NEGATIVE NEGATIVE Final    Comment:        The GeneXpert MRSA Assay (FDA approved for NASAL specimens only), is one component of a comprehensive MRSA colonization surveillance program. It is not intended to diagnose MRSA infection nor to guide or monitor treatment for MRSA infections.   Culture, blood (routine x 2)     Status: None (Preliminary result)   Collection Time: 06/11/14 10:23 PM  Result Value Ref Range Status   Specimen Description BLOOD LEFT HAND  Final   Special Requests BOTTLES DRAWN AEROBIC AND ANAEROBIC 5CC EACH  Final   Culture   Final           BLOOD CULTURE RECEIVED NO GROWTH TO DATE CULTURE WILL BE HELD FOR 5 DAYS BEFORE ISSUING A FINAL NEGATIVE REPORT Performed at [facility]    Report Status PENDING  Incomplete  Culture, blood (routine x 2)     Status: None (Preliminary result)   Collection Time: 06/11/14 10:31 PM  Result Value Ref Range Status   Specimen Description BLOOD LEFT WRIST  Final   Special Requests BOTTLES DRAWN AEROBIC AND ANAEROBIC 5CC EACH  Final   Culture   Final           BLOOD CULTURE RECEIVED NO GROWTH TO DATE CULTURE WILL BE HELD FOR 5 DAYS BEFORE ISSUING A FINAL NEGATIVE REPORT Performed at [facility]    Report Status  PENDING  Incomplete     Studies:  Recent x-ray studies have been reviewed in detail by the Attending Physician  Scheduled Meds:  Scheduled Meds: . antiseptic oral rinse  7 mL Mouth Rinse BID  . atovaquone  750 mg Oral Daily  . azithromycin  1,200 mg Oral Weekly  . buPROPion  300 mg Oral Daily  . Darunavir Ethanolate  800 mg Oral Daily  . docusate sodium  100 mg Oral BID  . emtricitabine-tenofovir  1 tablet Oral QHS  . feeding supplement (RESOURCE BREEZE)  1 Container Oral TID BM  . ferrous sulfate  325 mg Oral BID WC  . fluconazole  400 mg Oral  Daily  . lactulose  10 g Oral BID  . morphine  60 mg Oral Q12H  . pantoprazole  40 mg Oral Daily  . polyethylene glycol  17 g Oral Daily  . ritonavir  100 mg Oral Q breakfast  . senna-docusate  2 tablet Oral Daily    Time spent on care of this patient: 35 mins  Debbe Odea, MD [facility] For Consults/Admissions - Flow Manager [phone] Office  [phone]  Contact MD directly via text page:      [email or web address]      password [facility]  06/16/2014, 12:31 PM   LOS: 5 days

## 2014-06-17 NOTE — Progress Notes (Signed)
PROGRESS NOTE  Samuel Schultz KWI:[id number] DOB: 15-Jan-1976 DOA: 06/11/2014 PCP: [name], MD  HPI: 39 y.o. M w/ Hx AIDS, episodes of AMP sensitive enterococcal bacteremial, VRE, Candida Albicans isolated on aspirate from 01/31/14, cirrhosis of the liver, lymphoma, and pancytopenia who was taken to the ER at [facility] from rehabilitation after patient was found to be increasingly lethargic. Patient also was found to be hypoxic and mildly febrile in the ER. Chest x-ray did not show any definite infiltrates. Patient has chronic indwelling PICC line. Patient was complaining of increasing back pain and initial MRI suggested worsening discitis. Patient was transferred to [facility] for further management.   Subjective / 24 H Interval events - in moderate pain, denies chest pain/shortness of breath   Assessment/Plan: Principal Problem:   Acute encephalopathy Active Problems:   AIDS   Cirrhosis, non-alcoholic   Pancytopenia   Lumbar discitis   Thoracic discitis   Enterococcal bacteremia   Vancomycin resistant Enterococcus   Candida albicans infection   HIV disease   HCAP (healthcare-associated pneumonia)   Sepsis due to other etiology   Iliac crest osteosclerosis   Osteomyelitis   Abscess   Acute encephalopathy - resolved -resolved - felt to be due to infection (chronic osteo, possible PICC infection) and hepatic encephalopathy - ammonia now normal   SIRS v/s Sepsis of unclear etiology - ?osteomyelitis - ?PICC infection  -Patient met criteria for SIRS with WBC<4 + HR>90 - abx tx per ID - no evidence of bacteremia at this time   Chronic discitis T11-12 and L3-L5 region  - ID communicating further w/ Dr. Saintclair Halsted prn - initial T-spine and L-spine MRI suggested progression of disease w/ possible ventral epidural abscess  - repeat MRI WITH CONTRAST however suggested stable findings in area compared to prior studies  ?osteomyelitis of R hip Refused MRI on multiple occasions  1/7 - rescheduled for 1/8 - of note MRI Nov 12,2015 suggested possible septic arthritis in this region  - antibiotic management by ID - currently off antibiotics   AIDS -last CD4 count around 50 but per Dr. 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  Objective  Filed Vitals:   06/16/14 0609 06/16/14 1640 06/16/14 2127 06/17/14 0443  BP: 92/51 105/58 101/60 105/56  Pulse: 88 89 89 90  Temp: 98.7 F (37.1 C) 97.9 F (36.6 C) 97.7 F (36.5 C) 97.8 F (36.6 C)  TempSrc: Oral Oral Oral Oral  Resp: 20 18 18 18   Height:      Weight:      SpO2: 97% 95% 94% 96%    Intake/Output Summary (Last 24 hours) at 06/17/14 0750 Last data filed at 06/17/14 0300  Gross per 24 hour  Intake    480 ml  Output    250 ml  Net    230 ml  Filed Weights   06/11/14 2051  Weight: 75.3 kg (166 lb 0.1 oz)    Exam:  General:  NAD  HEENT: mild scleral icterus  Cardiovascular: RRR without MRG  Respiratory: no wheezing, moves air well   Abdomen: soft, nontender  MSK/Extremities: no cyanosis  Skin: no rashes  Neuro: non focal  Data Reviewed: Basic Metabolic Panel:  Recent Labs Lab 06/11/14 2225 06/12/14 0352 06/14/14 0328 06/16/14 0414  NA 128* 131* 130* 132*  K 3.7 3.9 3.7 3.7  CL 106 107 105 105  CO2 20 21 22 22   GLUCOSE 101* 91  78 74  BUN 6 <5* 7 5*  CREATININE 0.55 0.58 0.54 0.52  CALCIUM 7.4* 7.7* 7.7* 7.6*   Liver Function Tests:  Recent Labs Lab 06/11/14 2225 06/14/14 0328 06/16/14 0414  AST 61* 50* 49*  ALT 26 22 20   ALKPHOS 125* 105 106  BILITOT 2.4* 2.3* 2.1*  PROT 6.1 5.6* 5.9*  ALBUMIN 1.6* 1.5* 1.6*   No results for input(s): LIPASE, AMYLASE in the last 168 hours.  Recent Labs Lab 06/11/14 2225 06/13/14 0500 06/14/14 0328  AMMONIA 77* 64* 30   CBC:  Recent Labs Lab 06/11/14 2225 06/12/14 0352 06/14/14 0328 06/14/14 1222 06/16/14 0414  WBC 2.6* 3.1* 2.0*  --  2.0*  NEUTROABS 2.0  --   --   --   --   HGB 7.9* 8.2* 6.9* 8.1* 8.0*  HCT 23.5* 25.2* 20.8* 23.9* 24.8*  MCV 98.7 98.8 100.5*  --  100.0  PLT 63* 82* 72*  --  73*   CBG:  Recent Labs Lab 06/12/14 06/12/14 0511 06/13/14 1257  GLUCAP 88 105* 121*    Recent Results (from the past 240 hour(s))  MRSA PCR Screening     Status: None   Collection Time: 06/11/14  8:57 PM  Result Value Ref Range Status   MRSA by PCR NEGATIVE NEGATIVE Final    Comment:        The GeneXpert MRSA Assay (FDA approved for NASAL specimens only), is one component of a comprehensive MRSA colonization surveillance program. It is not intended to diagnose MRSA infection nor to guide or monitor treatment for MRSA infections.   Culture, blood (routine x 2)     Status: None (Preliminary result)   Collection Time: 06/11/14 10:23 PM  Result Value Ref Range Status   Specimen Description BLOOD LEFT HAND  Final   Special Requests BOTTLES DRAWN AEROBIC AND ANAEROBIC 5CC EACH  Final   Culture   Final           BLOOD CULTURE RECEIVED NO GROWTH TO DATE CULTURE WILL BE HELD FOR 5 DAYS BEFORE ISSUING A FINAL NEGATIVE REPORT Performed at [facility]    Report Status PENDING  Incomplete  Culture, blood (routine x 2)     Status: None (Preliminary result)   Collection Time: 06/11/14 10:31 PM  Result Value Ref Range Status   Specimen  Description BLOOD LEFT WRIST  Final   Special Requests BOTTLES DRAWN AEROBIC AND ANAEROBIC 5CC EACH  Final   Culture   Final           BLOOD CULTURE RECEIVED NO GROWTH TO DATE CULTURE WILL BE HELD FOR 5 DAYS BEFORE ISSUING A FINAL NEGATIVE REPORT Performed at [facility]    Report Status PENDING  Incomplete     Scheduled Meds: . antiseptic oral rinse  7 mL Mouth Rinse BID  . atovaquone  750 mg Oral Daily  .  azithromycin  1,200 mg Oral Weekly  . buPROPion  300 mg Oral Daily  . Darunavir Ethanolate  800 mg Oral Daily  . docusate sodium  100 mg Oral BID  . emtricitabine-tenofovir  1 tablet Oral QHS  . feeding supplement (RESOURCE BREEZE)  1 Container Oral TID BM  . ferrous sulfate  325 mg Oral BID WC  . fluconazole  400 mg Oral Daily  . lactulose  10 g Oral BID  . morphine  60 mg Oral Q12H  . pantoprazole  40 mg Oral Daily  . polyethylene glycol  17 g Oral Daily  . ritonavir  100 mg Oral Q breakfast  . senna-docusate  2 tablet Oral Daily   Continuous Infusions:   Marzetta Board, MD [facility] Pager [phone]. If 7 PM - 7 AM, please contact night-coverage at [email or web address], password [email or web address] 06/17/2014, 7:50 AM  LOS: 6 days

## 2014-06-17 NOTE — Progress Notes (Signed)
Patient ID: Samuel Schultz, male   DOB: 1975/07/09, 39 y.o.   MRN: [id number]         [facility]    Date of Admission:  06/11/2014     MRI PELVIS WITHOUT AND WITH CONTRAST  TECHNIQUE: Multiplanar multisequence MR imaging of the pelvis was performed both before and after administration of intravenous contrast.  CONTRAST: 20mL MULTIHANCE GADOBENATE DIMEGLUMINE 529 MG/ML IV SOLN  COMPARISON: MRI L-spine 06/12/2014, MRI L-spine 06/11/2014, MRI right hip 04/19/2014  FINDINGS: Bone  Marrow:  There is interval resolution of edema in the right femoral head. There is near complete resolution of edema in the right acetabulum.  The previously reported abnormality on recent MRI of the lumbar spine in the right ilium corresponds with fat signal consistent with a noninfectious etiology.  Again noted are benign-appearing lesions in knee left supra-acetabular ilium extending to knee posterior column most consistent with a benign chondroid lesion. There is a similar smaller lesion in the right iliac bone, also likely representing a benign chondroid lesion.  No fracture, dislocation or avascular necrosis.  There is transitional anatomy with lumbarization of the S1 vertebral body. There is T2 hyperintense signal within the L5-S1 disc space with enhancement on postcontrast imaging and enhancement of the adjacent S1 and S2 vertebral bodies most concerning for discitis.  Alignment  Normal. No subluxation.  Dysplasia  None.  Joint effusion  Small right hip joint effusion with enhancement most consistent with synovitis. No left hip joint effusion.  Labrum  No gross labral or paralabral abnormality.  Cartilage  Femoral cartilage: Right hip superior cartilage loss.  Acetabular cartilage: Right hip acetabular cartilage loss.  Capsule and ligaments  Normal.  Muscles and Tendons  There is a edema within the subcutaneous  fat around the pelvis. There is mild muscle edema bilaterally within the gluteal musculature. There is mild muscle edema and enhancement of the right obturator internus muscle. This may reflect reactive edema versus muscle strain versus infectious myositis.  The hamstring origins are normal. The iliopsoas tendons are normal.  Other Findings  There is no fluid collection or hematoma. There is no abscess.  Viscera  Normal. No abnormality seen in pelvis. No lymphadenopathy. No free fluid in the pelvis.  IMPRESSION: 1. Right hip arthritis with joint space narrowing and persistent synovitis most concerning for persistent septic arthritis. Inflammatory arthritis is also in the differential diagnosis but considered less likely. There is significant improvement in the right femoral head edema compared with 04/19/2014. There is mild edema and enhancement of the right acetabulum which is also improved. 2. Discitis/osteomyelitis versus reactive changes at S1-2.   Electronically Signed  By: Kathreen Devoid  On: 06/17/2014 13:36   Assessment: The findings on his MRI actually looks somewhat improved. He may have some myositis around his right hip but I do not see any abscess, fluid pockets or osteomyelitis that would clearly be targets for aspiration or biopsy. I will discuss findings with Dr. Tommy Medal and have him followup tomorrow.  Plan: 1. No changes at this time  Michel Bickers, [facility] [phone] pager   [phone] cell 06/17/2014, 2:37 PM

## 2014-06-17 NOTE — Progress Notes (Signed)
Pt refused mepron solution. States it makes him vomit.

## 2014-06-18 LAB — CBC
HCT: 23.8 % — ABNORMAL LOW (ref 39.0–52.0)
HEMOGLOBIN: 7.6 g/dL — AB (ref 13.0–17.0)
MCH: 32.3 pg (ref 26.0–34.0)
MCHC: 31.9 g/dL (ref 30.0–36.0)
MCV: 101.3 fL — AB (ref 78.0–100.0)
PLATELETS: 71 10*3/uL — AB (ref 150–400)
RBC: 2.35 MIL/uL — ABNORMAL LOW (ref 4.22–5.81)
RDW: 19.9 % — AB (ref 11.5–15.5)
WBC: 1.9 10*3/uL — AB (ref 4.0–10.5)

## 2014-06-18 LAB — COMPREHENSIVE METABOLIC PANEL
ALT: 20 U/L (ref 0–53)
ANION GAP: 1 — AB (ref 5–15)
AST: 46 U/L — ABNORMAL HIGH (ref 0–37)
Albumin: 1.5 g/dL — ABNORMAL LOW (ref 3.5–5.2)
Alkaline Phosphatase: 102 U/L (ref 39–117)
BUN: 5 mg/dL — AB (ref 6–23)
CO2: 26 mmol/L (ref 19–32)
CREATININE: 0.5 mg/dL (ref 0.50–1.35)
Calcium: 7.6 mg/dL — ABNORMAL LOW (ref 8.4–10.5)
Chloride: 105 mEq/L (ref 96–112)
GFR calc Af Amer: >90 mL/min (ref >90)
GFR calc non Af Amer: >90 mL/min (ref >90)
Glucose, Bld: 79 mg/dL (ref 70–99)
Potassium: 3.8 mmol/L (ref 3.5–5.1)
Sodium: 132 mmol/L — ABNORMAL LOW (ref 135–145)
Total Bilirubin: 1.9 mg/dL — ABNORMAL HIGH (ref 0.3–1.2)
Total Protein: 5.6 g/dL — ABNORMAL LOW (ref 6.0–8.3)

## 2014-06-18 LAB — CULTURE, BLOOD (ROUTINE X 2)
CULTURE: NO GROWTH
Culture: NO GROWTH

## 2014-06-18 LAB — PROTIME-INR
INR: 1.7 — ABNORMAL HIGH (ref 0.00–1.49)
Prothrombin Time: 20.1 seconds — ABNORMAL HIGH (ref 11.6–15.2)

## 2014-06-18 MED ORDER — PROMETHAZINE HCL 25 MG RE SUPP: 12.5000 mg | Freq: Four times a day (QID) | RECTAL | Status: DC | PRN

## 2014-06-18 MED ORDER — PROMETHAZINE HCL 25 MG/ML IJ SOLN
6.2500 mg | Freq: Four times a day (QID) | INTRAMUSCULAR | Status: DC | PRN
  Administered 2014-06-18 – 2014-06-19 (×2): 6.25 mg via INTRAVENOUS
  Filled 2014-06-18 (×2): qty 1

## 2014-06-18 MED ORDER — SODIUM CHLORIDE 0.9 % IJ SOLN
10.0000 mL | INTRAMUSCULAR | Status: DC | PRN
  Administered 2014-06-18: 20 mL
  Administered 2014-06-19: 30 mL
  Filled 2014-06-18: qty 40

## 2014-06-18 MED ORDER — DARUNAVIR ETHANOLATE 800 MG PO TABS
800.0000 mg | ORAL_TABLET | Freq: Every day | ORAL | Status: DC
  Administered 2014-06-21: 800 mg via ORAL
  Filled 2014-06-18 (×4): qty 1

## 2014-06-18 MED ORDER — PROMETHAZINE HCL 25 MG PO TABS: 12.5000 mg | ORAL_TABLET | Freq: Four times a day (QID) | ORAL | Status: DC | PRN

## 2014-06-18 MED ORDER — AMOXICILLIN-POT CLAVULANATE 875-125 MG PO TABS
1.0000 | ORAL_TABLET | Freq: Two times a day (BID) | ORAL | Status: DC
  Administered 2014-06-18 – 2014-06-20 (×2): 1 via ORAL
  Filled 2014-06-18 (×8): qty 1

## 2014-06-18 NOTE — Progress Notes (Signed)
Chaplain initiated visit with pt. Pt sleepy at this time. Chaplain informed pt of her services should he need them. Page chaplain as needed.    06/18/14 1400  Clinical Encounter Type  Visited With Patient  Visit Type Initial;Spiritual support  Stress Factors  Patient Stress Factors None identified  Kaitlyn Franko, Barbette Hair, Chaplain 06/18/2014 2:56 PM

## 2014-06-18 NOTE — Progress Notes (Signed)
PROGRESS NOTE  Samuel Schultz BSJ:[id number] DOB: Dec 16, 1975 DOA: 06/11/2014 PCP: [name], MD  HPI: 39 y.o. 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 Patient also was found to be hypoxic and mildly febrile in the ER. Chest x-ray did not show any definite infiltrates. Patient has chronic indwelling PICC line. Patient was complaining of increasing back pain and initial MRI suggested worsening discitis. Patient was transferred to [facility] for further management.   Subjective / 24 H Interval events - endorses feeling weak and tired  Assessment/Plan: Principal Problem:   Acute encephalopathy Active Problems:   AIDS   Cirrhosis, non-alcoholic   Pancytopenia   Lumbar discitis   Thoracic discitis   Enterococcal bacteremia   Vancomycin resistant Enterococcus   Candida albicans infection   HIV disease   HCAP (healthcare-associated pneumonia)   Sepsis due to other etiology   Iliac crest osteosclerosis   Osteomyelitis   Abscess  Acute encephalopathy - resolved - resolved - felt to be due to infection (chronic osteo, possible PICC infection) and hepatic encephalopathy - ammonia now normal   SIRS v/s Sepsis of unclear etiology - ?osteomyelitis - ?PICC infection  -Patient met criteria for SIRS with WBC<4 + HR>90 - abx tx per ID - no evidence of bacteremia at this time   Chronic discitis T11-12 and L3-L5 region  - ID communicating further w/ Dr. Saintclair Halsted prn - initial T-spine and L-spine MRI suggested progression of disease w/ possible ventral epidural abscess  - repeat MRI WITH CONTRAST however suggested stable findings in area compared to prior studies - ID input appreciated  ?Osteomyelitis of R hip Refused MRI on multiple occasions 1/7  - able to obtain MRI pelvis 1/10, stable - antibiotic management by ID - currently off antibiotics, plan to start perhaps oral antibiotics with PICC removal  AIDS -last CD4 count around 50 but per Dr. 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  EXAM: MRI PELVIS WITHOUT AND WITH CONTRAST  TECHNIQUE: Multiplanar multisequence MR imaging of the pelvis was performed both before and after administration of intravenous contrast.  CONTRAST:  56m MULTIHANCE GADOBENATE DIMEGLUMINE 529 MG/ML IV SOLN  COMPARISON:  MRI L-spine 06/12/2014, MRI L-spine 06/11/2014, MRI right hip 04/19/2014  FINDINGS: Bone  Marrow:  There is interval resolution of edema in the right femoral head. There is near complete resolution of edema in the right acetabulum.  The previously reported abnormality on recent MRI of the lumbar spine in the right ilium corresponds with fat signal consistent with a noninfectious etiology.  Again noted are benign-appearing lesions in knee left supra-acetabular ilium extending to knee posterior column most consistent with a benign chondroid lesion. There is a similar smaller lesion in the right iliac bone, also likely representing a benign chondroid lesion.  No fracture,  dislocation or avascular necrosis.  There is transitional anatomy with lumbarization of the S1 vertebral body. There is T2 hyperintense signal within the L5-S1 disc space with enhancement on postcontrast imaging and enhancement of the adjacent S1 and S2 vertebral bodies most concerning for discitis.  Alignment  Normal. No subluxation.  Dysplasia  None.  Joint effusion  Small right hip joint effusion with enhancement most consistent with synovitis. No left hip joint effusion.  Labrum  No gross labral or paralabral abnormality.  Cartilage  Femoral cartilage: Right hip superior cartilage loss.  Acetabular cartilage: Right hip acetabular cartilage loss.  Capsule and ligaments  Normal.  Muscles and Tendons  There is a edema within the subcutaneous fat around the pelvis. There is mild muscle edema bilaterally within the gluteal musculature. There is mild muscle edema and enhancement of the right obturator internus muscle. This may reflect reactive edema versus muscle strain versus infectious myositis.  The hamstring origins are normal.  The iliopsoas tendons are normal.  Other Findings  There is no fluid collection or hematoma.  There is no abscess.  Viscera  Normal. No abnormality seen in pelvis. No lymphadenopathy. No free fluid in the pelvis.  IMPRESSION: 1. Right hip arthritis with joint space narrowing and persistent synovitis most concerning for persistent septic arthritis. Inflammatory arthritis is also in the differential diagnosis but considered less likely. There is significant improvement in the right femoral head edema compared with 04/19/2014. There is mild edema and enhancement of the right acetabulum which is also improved. 2. Discitis/osteomyelitis versus reactive changes at S1-2.   Electronically Signed   By: Hetal  Patel   On: 06/17/2014 13:36    Objective  Filed Vitals:   06/17/14 0443 06/17/14 1112 06/17/14 2118 06/18/14 0637  BP: 105/56 93/51 109/59 99/54  Pulse: 90 93 92 80  Temp: 97.8 F  (36.6 C) 98.1 F (36.7 C) 98.4 F (36.9 C) 98.2 F (36.8 C)  TempSrc: Oral Oral Oral Oral  Resp: 18 18  18  Height:      Weight:      SpO2: 96% 95% 97% 97%    Intake/Output Summary (Last 24 hours) at 06/18/14 0738 Last data filed at 06/18/14 0300  Gross per 24 hour  Intake    920 ml  Output    200 ml  Net    720 ml   Filed Weights   06/11/14 2051  Weight: 75.3 kg (166 lb 0.1 oz)    Exam:  General:  NAD  HEENT: mild scleral icterus  Cardiovascular: RRR without MRG  Respiratory: no wheezing, moves air well   Abdomen: soft, nontender  MSK/Extremities: no cyanosis  Skin: no rashes  Neuro: non focal  Data Reviewed: Basic Metabolic Panel:  Recent Labs Lab 06/11/14 2225 06/12/14 0352 06/14/14 0328 06/16/14 0414 06/18/14 0606  NA 128* 131* 130* 132* 132*  K 3.7 3.9 3.7 3.7 3.8  CL 106 107 105 105 105  CO2 20 21 22 22 26  GLUCOSE 101* 91 78 74 79  BUN 6 <5* 7 5* 5*  CREATININE 0.55 0.58 0.54 0.52 0.50  CALCIUM 7.4* 7.7* 7.7* 7.6* 7.6*   Liver Function Tests:  Recent Labs Lab 06/11/14 2225 06/14/14 0328 06/16/14 0414 06/18/14 0606    AST 61* 50* 49* 46*  ALT _0 ALKPHOS 125* 105 106 102  BILITOT 2.4* 2.3* 2.1* 1.9*  PROT 6.1 5.6* 5.9* 5.6*  ALBUMIN 1.6* 1.5* 1.6* 1.5*   No results for input(s): LIPASE, AMYLASE in the last 168 hours.  Recent Labs Lab 06/11/14 2225 06/13/14 0500 06/14/14 0328  AMMONIA 77* 64* 30   CBC:  Recent Labs Lab 06/11/14 2225 06/12/14 0352 06/14/14 0328 06/14/14 1222 06/16/14 0414 06/18/14 0606  WBC 2.6* 3.1* 2.0*  --  2.0* 1.9*  NEUTROABS 2.0  --   --   --   --   --   HGB 7.9* 8.2* 6.9* 8.1* 8.0* 7.6*  HCT 23.5* 25.2* 20.8* 23.9* 24.8* 23.8*  MCV 98.7 98.8 100.5*  --  100.0 101.3*  PLT 63* 82* 72*  --  73* 71*   CBG:  Recent Labs Lab 06/12/14 06/12/14 0511 06/13/14 1257  GLUCAP 88 105* 121*    Recent Results (from the past 240 hour(s))  MRSA PCR Screening     Status: None   Collection  Time: 06/11/14  8:57 PM  Result Value Ref Range Status   MRSA by PCR NEGATIVE NEGATIVE Final    Comment:        The GeneXpert MRSA Assay (FDA approved for NASAL specimens only), is one component of a comprehensive MRSA colonization surveillance program. It is not intended to diagnose MRSA infection nor to guide or monitor treatment for MRSA infections.   Culture, blood (routine x 2)     Status: None (Preliminary result)   Collection Time: 06/11/14 10:23 PM  Result Value Ref Range Status   Specimen Description BLOOD LEFT HAND  Final   Special Requests BOTTLES DRAWN AEROBIC AND ANAEROBIC 5CC EACH  Final   Culture   Final           BLOOD CULTURE RECEIVED NO GROWTH TO DATE CULTURE WILL BE HELD FOR 5 DAYS BEFORE ISSUING A FINAL NEGATIVE REPORT Performed at [facility]    Report Status PENDING  Incomplete  Culture, blood (routine x 2)     Status: None (Preliminary result)   Collection Time: 06/11/14 10:31 PM  Result Value Ref Range Status   Specimen Description BLOOD LEFT WRIST  Final   Special Requests BOTTLES DRAWN AEROBIC AND ANAEROBIC 5CC EACH  Final   Culture   Final           BLOOD CULTURE RECEIVED NO GROWTH TO DATE CULTURE WILL BE HELD FOR 5 DAYS BEFORE ISSUING A FINAL NEGATIVE REPORT Performed at [facility]    Report Status PENDING  Incomplete     Scheduled Meds: . antiseptic oral rinse  7 mL Mouth Rinse BID  . atovaquone  750 mg Oral Daily  . azithromycin  1,200 mg Oral Weekly  . buPROPion  300 mg Oral Daily  . Darunavir Ethanolate  800 mg Oral Daily  . docusate sodium  100 mg Oral BID  . emtricitabine-tenofovir  1 tablet Oral QHS  . feeding supplement (RESOURCE BREEZE)  1 Container Oral TID BM  . ferrous sulfate  325 mg Oral BID WC  . fluconazole  400 mg Oral Daily  . lactulose  10 g Oral BID  . morphine  60 mg Oral Q12H  . pantoprazole  40 mg Oral Daily  . polyethylene glycol  17 g Oral Daily  . ritonavir  100 mg Oral Q breakfast  .  senna-docusate  2 tablet Oral Daily  Continuous Infusions:   Marzetta Board, MD [facility] Pager [phone]. If 7 PM - 7 AM, please contact night-coverage at [email or web address], password [facility] 06/18/2014, 7:38 AM  LOS: 7 days

## 2014-06-18 NOTE — Progress Notes (Signed)
[facility]    Subjective: Patient is concerned that he is scheduled for yet another MRI today. Turns out this confusion was due to the fact that I did order an MRI as an outpatient and it was still showing up as an active order in Epic   Antibiotics:  Anti-infectives    Start     Dose/Rate Route Frequency Ordered Stop   06/19/14 0800  Darunavir Ethanolate (PREZISTA) tablet 800 mg     800 mg Oral Daily with breakfast 06/18/14 1141     06/13/14 1700  vancomycin (VANCOCIN) IVPB 1000 mg/200 mL premix  Status:  Discontinued     1,000 mg200 mL/hr over 60 Minutes Intravenous Every 8 hours 06/13/14 1640 06/13/14 1644   06/12/14 1000  azithromycin (ZITHROMAX) tablet 1,200 mg     1,200 mg Oral Weekly 06/11/14 2201     06/12/14 0700  ritonavir (NORVIR) capsule 100 mg     100 mg Oral Daily with breakfast 06/11/14 2201     06/12/14 0000  vancomycin (VANCOCIN) IVPB 750 mg/150 ml premix  Status:  Discontinued     750 mg150 mL/hr over 60 Minutes Intravenous Every 8 hours 06/11/14 2236 06/13/14 1559   06/11/14 2300  ceFEPIme (MAXIPIME) 1 g in dextrose 5 % 50 mL IVPB  Status:  Discontinued     1 g100 mL/hr over 30 Minutes Intravenous 3 times per day 06/11/14 2236 06/13/14 1559   06/11/14 2215  fluconazole (DIFLUCAN) tablet 400 mg     400 mg Oral Daily 06/11/14 2201     06/11/14 2215  vancomycin (VANCOCIN) 1,250 mg in sodium chloride 0.9 % 250 mL IVPB  Status:  Discontinued     1,250 mg166.7 mL/hr over 90 Minutes Intravenous Every 8 hours 06/11/14 2201 06/11/14 2235   06/11/14 2215  atovaquone (MEPRON) 750 MG/5ML suspension 750 mg     750 mg Oral Daily 06/11/14 2201     06/11/14 2215  Darunavir Ethanolate (PREZISTA) tablet 800 mg  Status:  Discontinued     800 mg Oral Daily 06/11/14 2201 06/18/14 1141   06/11/14 2215  emtricitabine-tenofovir (TRUVADA) 200-300 MG per tablet 1 tablet     1 tablet Oral Daily at bedtime 06/11/14 2201        Medications: Scheduled  Meds: . antiseptic oral rinse  7 mL Mouth Rinse BID  . atovaquone  750 mg Oral Daily  . azithromycin  1,200 mg Oral Weekly  . buPROPion  300 mg Oral Daily  . [START ON 06/19/2014] Darunavir Ethanolate  800 mg Oral Q breakfast  . docusate sodium  100 mg Oral BID  . emtricitabine-tenofovir  1 tablet Oral QHS  . feeding supplement (RESOURCE BREEZE)  1 Container Oral TID BM  . ferrous sulfate  325 mg Oral BID WC  . fluconazole  400 mg Oral Daily  . lactulose  10 g Oral BID  . morphine  60 mg Oral Q12H  . pantoprazole  40 mg Oral Daily  . polyethylene glycol  17 g Oral Daily  . ritonavir  100 mg Oral Q breakfast  . senna-docusate  2 tablet Oral Daily   Continuous Infusions:  PRN Meds:.acetaminophen **OR** acetaminophen, ALPRAZolam, antiseptic oral rinse, clonazePAM, LORazepam, menthol-cetylpyridinium, methocarbamol, morphine, ondansetron **OR** ondansetron (ZOFRAN) IV, sodium chloride, sucralfate    Objective: Weight change:   Intake/Output Summary (Last 24 hours) at 06/18/14 1754 Last data filed at 06/18/14 1500  Gross per 24 hour  Intake   1040 ml  Output    900 ml  Net    140 ml   Blood pressure 91/53, pulse 92, temperature 98.4 F (36.9 C), temperature source Oral, resp. rate 18, height 5\' 9"  (1.753 m), weight 166 lb 0.1 oz (75.3 kg), SpO2 97 %. Temp:  [98.2 F (36.8 C)-98.4 F (36.9 C)] 98.4 F (36.9 C) (01/11 1300) Pulse Rate:  [80-92] 92 (01/11 1300) Resp:  [18] 18 (01/11 1300) BP: (91-109)/(53-59) 91/53 mmHg (01/11 1300) SpO2:  [97 %] 97 % (01/11 1300)  Physical Exam: General: Slumped over in bed on his side speaking in very soft voice oriented to place and person and location HEENT: , EOMI,  [facility] regular rate, normal r, no murmur rubs or gallops Chest: no wheezing, rales or rhonchi Neuro: nonfocal, strength and sensation intact CBC:  CBC Latest Ref Rng 06/18/2014 06/16/2014 06/14/2014  WBC 4.0 - 10.5 K/uL 1.9(L) 2.0(L) -  Hemoglobin 13.0 - 17.0 g/dL 7.6(L) 8.0(L)  8.1(L)  Hematocrit 39.0 - 52.0 % 23.8(L) 24.8(L) 23.9(L)  Platelets 150 - 400 K/uL 71(L) 73(L) -      BMET  Recent Labs  06/16/14 0414 06/18/14 0606  NA 132* 132*  K 3.7 3.8  CL 105 105  CO2 22 26  GLUCOSE 74 79  BUN 5* 5*  CREATININE 0.52 0.50  CALCIUM 7.6* 7.6*     Liver Panel   Recent Labs  06/16/14 0414 06/18/14 0606  PROT 5.9* 5.6*  ALBUMIN 1.6* 1.5*  AST 49* 46*  ALT 20 20  ALKPHOS 106 102  BILITOT 2.1* 1.9*       Sedimentation Rate No results for input(s): ESRSEDRATE in the last 72 hours. C-Reactive Protein No results for input(s): CRP in the last 72 hours.  Micro Results: Recent Results (from the past 720 hour(s))  MRSA PCR Screening     Status: None   Collection Time: 06/11/14  8:57 PM  Result Value Ref Range Status   MRSA by PCR NEGATIVE NEGATIVE Final    Comment:        The GeneXpert MRSA Assay (FDA approved for NASAL specimens only), is one component of a comprehensive MRSA colonization surveillance program. It is not intended to diagnose MRSA infection nor to guide or monitor treatment for MRSA infections.   Culture, blood (routine x 2)     Status: None   Collection Time: 06/11/14 10:23 PM  Result Value Ref Range Status   Specimen Description BLOOD LEFT HAND  Final   Special Requests BOTTLES DRAWN AEROBIC AND ANAEROBIC 5CC EACH  Final   Culture   Final    NO GROWTH 5 DAYS Performed at [facility]    Report Status 06/18/2014 FINAL  Final  Culture, blood (routine x 2)     Status: None   Collection Time: 06/11/14 10:31 PM  Result Value Ref Range Status   Specimen Description BLOOD LEFT WRIST  Final   Special Requests BOTTLES DRAWN AEROBIC AND ANAEROBIC 5CC EACH  Final   Culture   Final    NO GROWTH 5 DAYS Performed at [facility]    Report Status 06/18/2014 FINAL  Final    Studies/Results: Mr Pelvis W Wo Contrast  06/17/2014   CLINICAL DATA:  Right hip and pelvic pain.  EXAM: MRI PELVIS WITHOUT AND  WITH CONTRAST  TECHNIQUE: Multiplanar multisequence MR imaging of the pelvis was performed both before and after administration of intravenous contrast.  CONTRAST:  104mL MULTIHANCE GADOBENATE DIMEGLUMINE 529 MG/ML IV SOLN  COMPARISON:  MRI L-spine 06/12/2014, MRI L-spine 06/11/2014, MRI right hip 04/19/2014  FINDINGS: Bone  Marrow:  There is interval resolution of edema in the right femoral head. There is near complete resolution of edema in the right acetabulum.  The previously reported abnormality on recent MRI of the lumbar spine in the right ilium corresponds with fat signal consistent with a noninfectious etiology.  Again noted are benign-appearing lesions in knee left supra-acetabular ilium extending to knee posterior column most consistent with a benign chondroid lesion. There is a similar smaller lesion in the right iliac bone, also likely representing a benign chondroid lesion.  No fracture, dislocation or avascular necrosis.  There is transitional anatomy with lumbarization of the S1 vertebral body. There is T2 hyperintense signal within the L5-S1 disc space with enhancement on postcontrast imaging and enhancement of the adjacent S1 and S2 vertebral bodies most concerning for discitis.  Alignment  Normal. No subluxation.  Dysplasia  None.  Joint effusion  Small right hip joint effusion with enhancement most consistent with synovitis. No left hip joint effusion.  Labrum  No gross labral or paralabral abnormality.  Cartilage  Femoral cartilage: Right hip superior cartilage loss.  Acetabular cartilage: Right hip acetabular cartilage loss.  Capsule and ligaments  Normal.  Muscles and Tendons  There is a edema within the subcutaneous fat around the pelvis. There is mild muscle edema bilaterally within the gluteal musculature. There is mild muscle edema and enhancement of the right obturator internus muscle. This may reflect reactive edema versus muscle strain versus infectious myositis.  The hamstring origins  are normal.  The iliopsoas tendons are normal.  Other Findings  There is no fluid collection or hematoma.  There is no abscess.  Viscera  Normal. No abnormality seen in pelvis. No lymphadenopathy. No free fluid in the pelvis.  IMPRESSION: 1. Right hip arthritis with joint space narrowing and persistent synovitis most concerning for persistent septic arthritis. Inflammatory arthritis is also in the differential diagnosis but considered less likely. There is significant improvement in the right femoral head edema compared with 04/19/2014. There is mild edema and enhancement of the right acetabulum which is also improved. 2. Discitis/osteomyelitis versus reactive changes at S1-2.   Electronically Signed   By: Kathreen Devoid   On: 06/17/2014 13:36                    Assessment/Plan:  Principal Problem:   Acute encephalopathy Active Problems:   AIDS   Cirrhosis, non-alcoholic   Pancytopenia   Lumbar discitis   Thoracic discitis   Enterococcal bacteremia   Vancomycin resistant Enterococcus   Candida albicans infection   HIV disease   HCAP (healthcare-associated pneumonia)   Sepsis due to other etiology   Iliac crest osteosclerosis   Osteomyelitis   Abscess    Samuel Schultz is a 39 y.o. male with  Samuel Schultz is a 39 y.o. 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 We took him off antibiotics for a week prior to surgery and he is now sp L5-S1  microdiscectomy with nerve  root discetomy and biopsies mx for culture by Dr. Saintclair Halsted. Operative cultures failed to grow any bacterial organisms or fungal cultures at 4 weeks if the cultures are still pending. He was on placed on micafungin and vancomycin has remained on these for 5 weeks then vancomycin and high fluconazole but with worsening back pain prior even to the switch. MRI without contrast showed progressive discitis at the L3 L4 region collapse of vertebra at T11-12 and questionable ventral epidural thickening BUT NOT ABSCESS near surgical sitein sacral region. MRI with contrast read albeit with motion degradation as showing enhancement felt to be more c/w chronic osteomyelitis. and also shows finding in iliac bone on the right suggestive of osteomyelitis but this is not borne out by dedicated pelvis MRI which DOES show persistent but improved septic arthritis on the right   #1 Discitis by MR imaging in the L 3 L4 , L5, not clearly acute by MRI WITH contrast as opposed to WITHOUT. I WANT TO CORRECT MIS-INTERPRETATION I MADE YESTERDAY IN THAT THE MRI WITHOUT CONTRAST WHILE SHOWNG VENTRAL EPIDURAL THICKENING DID NOT SHOW AN ABSCESS  Everything here looks stable to improved  Will continue fluconazole and change to oral augmentin   #2 Prior RIght hip synovitis now with ? Osteo on MRI of the ILIAC,  MRI showed now osteo in iliac and improving yet persistent septic arthritis right hip  --I will place him on oral augmentin which would cover the AMP sensitive enterococcus that originally was isolated from blood culture 2/2 and which I suspect is prime suspect besides his Candida that grew from one disk space aspirate  I will plan on continuing the fluconazole indefinitely, the augmentin for another 1-2 months with serial exams in clinic and consideration for repeat imaging  #3 HIV continue his current regimen is quite well controlled       LOS: 7 days   Samuel Schultz 06/18/2014, 5:53 PM

## 2014-06-19 ENCOUNTER — Inpatient Hospital Stay (HOSPITAL_COMMUNITY): Payer: Medicaid Other

## 2014-06-19 DIAGNOSIS — B379 Candidiasis, unspecified: Secondary | ICD-10-CM

## 2014-06-19 DIAGNOSIS — R509 Fever, unspecified: Secondary | ICD-10-CM | POA: Insufficient documentation

## 2014-06-19 LAB — COMPREHENSIVE METABOLIC PANEL
ALBUMIN: 1.5 g/dL — AB (ref 3.5–5.2)
ALT: 22 U/L (ref 0–53)
ANION GAP: 5 (ref 5–15)
AST: 57 U/L — ABNORMAL HIGH (ref 0–37)
Alkaline Phosphatase: 98 U/L (ref 39–117)
BUN: 5 mg/dL — AB (ref 6–23)
CO2: 23 mmol/L (ref 19–32)
Calcium: 7.8 mg/dL — ABNORMAL LOW (ref 8.4–10.5)
Chloride: 104 mEq/L (ref 96–112)
Creatinine, Ser: 0.65 mg/dL (ref 0.50–1.35)
GFR calc Af Amer: >90 mL/min (ref >90)
GFR calc non Af Amer: >90 mL/min (ref >90)
Glucose, Bld: 94 mg/dL (ref 70–99)
POTASSIUM: 3.8 mmol/L (ref 3.5–5.1)
SODIUM: 132 mmol/L — AB (ref 135–145)
TOTAL PROTEIN: 6.1 g/dL (ref 6.0–8.3)
Total Bilirubin: 3.1 mg/dL — ABNORMAL HIGH (ref 0.3–1.2)

## 2014-06-19 LAB — CBC
HCT: 26.2 % — ABNORMAL LOW (ref 39.0–52.0)
HEMOGLOBIN: 8.6 g/dL — AB (ref 13.0–17.0)
MCH: 34.1 pg — AB (ref 26.0–34.0)
MCHC: 32.8 g/dL (ref 30.0–36.0)
MCV: 104 fL — ABNORMAL HIGH (ref 78.0–100.0)
PLATELETS: 62 10*3/uL — AB (ref 150–400)
RBC: 2.52 MIL/uL — AB (ref 4.22–5.81)
RDW: 19.8 % — ABNORMAL HIGH (ref 11.5–15.5)
WBC: 2.4 10*3/uL — AB (ref 4.0–10.5)

## 2014-06-19 MED ORDER — BOOST PLUS PO LIQD
237.0000 mL | Freq: Two times a day (BID) | ORAL | Status: DC
  Filled 2014-06-19 (×7): qty 237

## 2014-06-19 NOTE — Progress Notes (Signed)
NUTRITION FOLLOW UP  DOCUMENTATION CODES Per approved criteria  -Severe malnutrition in the context of chronic illness   INTERVENTION: Continue Resource Breeze PO TID, each supplement provides 250 kcal and 9 grams of protein.  Provide Boost Plus po BID, each supplement provides 360 kcal and 14 grams of protein.  Encourage PO intake.  NUTRITION DIAGNOSIS: Malnutrition related to inadequate oral intake as evidenced by intake </= 75% of estimated energy requirement for >/= 1 month and 15% weight loss within 6 months; ongoing  Goal: Intake to meet >90% of estimated nutrition needs; not met  Monitor:  PO intake, labs, weight trend, I/O's  39 y.o. male  Admitting Dx: Acute encephalopathy  ASSESSMENT: 39 y.o. male with history of AIDS, cirrhosis of the liver, history of lymphoma and pancytopenia was brought to the ER at [facility] from rehabilitation after patient was found to be increasingly lethargic with back pain (worsening discitis).  Pt with continued decreased appetite. Pt currently with abdominal pains. Meal completion has been 0-50%. Pt has been drinking his [employer]. RD to additionally order Boost Plus as pt has drank them PTA. Continue to encourage pt to eat his food at meals and to drink his supplements.  Pt meets criteria for severe MALNUTRITION in the context of chronic illness as evidenced by intake </= 75% of estimated energy requirement for >/= 1 month and 15% weight loss within 6 months.  Labs: Low sodium, BUN, calcium. High AST and total bilirubin.  Height: Ht Readings from Last 1 Encounters:  06/11/14 5' 9"  (1.753 m)    Weight: Wt Readings from Last 1 Encounters:  06/11/14 166 lb 0.1 oz (75.3 kg)    BMI:  Body mass index is 24.5 kg/(m^2).  Re-Estimated Nutritional Needs: Kcal: 2200-2400 Protein: 115-125 gm Fluid: 2.4 L  Skin: Stage II pressure ulcer on sacum  Diet Order: Diet regular   Intake/Output Summary (Last 24 hours) at 06/19/14 1448 Last  data filed at 06/19/14 0500  Gross per 24 hour  Intake      0 ml  Output    550 ml  Net   -550 ml    Last BM: 1/10  Labs:   Recent Labs Lab 06/16/14 0414 06/18/14 0606 06/19/14 0440  NA 132* 132* 132*  K 3.7 3.8 3.8  CL 105 105 104  CO2 22 26 23   BUN 5* 5* 5*  CREATININE 0.52 0.50 0.65  CALCIUM 7.6* 7.6* 7.8*  GLUCOSE 74 79 94    CBG (last 3)  No results for input(s): GLUCAP in the last 72 hours.  Scheduled Meds: . amoxicillin-clavulanate  1 tablet Oral Q12H  . antiseptic oral rinse  7 mL Mouth Rinse BID  . azithromycin  1,200 mg Oral Weekly  . buPROPion  300 mg Oral Daily  . Darunavir Ethanolate  800 mg Oral Q breakfast  . docusate sodium  100 mg Oral BID  . emtricitabine-tenofovir  1 tablet Oral QHS  . feeding supplement (RESOURCE BREEZE)  1 Container Oral TID BM  . ferrous sulfate  325 mg Oral BID WC  . fluconazole  400 mg Oral Daily  . lactulose  10 g Oral BID  . morphine  60 mg Oral Q12H  . pantoprazole  40 mg Oral Daily  . polyethylene glycol  17 g Oral Daily  . ritonavir  100 mg Oral Q breakfast  . senna-docusate  2 tablet Oral Daily    Continuous Infusions:    Past Medical History  Diagnosis Date  .  HIV disease   . Cirrhosis   . PTSD (post-traumatic stress disorder)   . Depressive disorder   . Cancer   . Lymphoma   . Anemia     Past Surgical History  Procedure Laterality Date  . Cholecystectomy    . Appendectomy    . Tee without cardioversion N/A 11/13/2013    Procedure: TRANSESOPHAGEAL ECHOCARDIOGRAM (TEE);  Surgeon: Dorothy Spark, MD;  Location: [facility];  Service: Cardiovascular;  Laterality: N/A;  . Infection of spine    . Lumbar laminectomy/decompression microdiscectomy Left 04/16/2014    Procedure: Left Lumbar five-Sacral one laminectomy decompression and discectomy ;  Surgeon: Elaina Hoops, MD;  Location: [facility];  Service: Neurosurgery;  Laterality: Left;    Kallie Locks, MS, RD, LDN Pager # [phone] After hours/  weekend pager # [phone]

## 2014-06-19 NOTE — Progress Notes (Signed)
Pt refused all his morning medications. Pt stated that he was nausea, gave pt antemetic per MD order. Attempted to administer medications again at a later time, Pt still refused to take them.

## 2014-06-19 NOTE — Progress Notes (Signed)
[facility]    Subjective:   Patient is complaining of abdominal pain started last night potentially due to the Augmentin that he just started he also had a fever last night  Antibiotics:  Anti-infectives    Start     Dose/Rate Route Frequency Ordered Stop   06/19/14 0800  Darunavir Ethanolate (PREZISTA) tablet 800 mg     800 mg Oral Daily with breakfast 06/18/14 1141     06/18/14 2200  amoxicillin-clavulanate (AUGMENTIN) 875-125 MG per tablet 1 tablet     1 tablet Oral Every 12 hours 06/18/14 1758     06/13/14 1700  vancomycin (VANCOCIN) IVPB 1000 mg/200 mL premix  Status:  Discontinued     1,000 mg200 mL/hr over 60 Minutes Intravenous Every 8 hours 06/13/14 1640 06/13/14 1644   06/12/14 1000  azithromycin (ZITHROMAX) tablet 1,200 mg     1,200 mg Oral Weekly 06/11/14 2201     06/12/14 0700  ritonavir (NORVIR) capsule 100 mg     100 mg Oral Daily with breakfast 06/11/14 2201     06/12/14 0000  vancomycin (VANCOCIN) IVPB 750 mg/150 ml premix  Status:  Discontinued     750 mg150 mL/hr over 60 Minutes Intravenous Every 8 hours 06/11/14 2236 06/13/14 1559   06/11/14 2300  ceFEPIme (MAXIPIME) 1 g in dextrose 5 % 50 mL IVPB  Status:  Discontinued     1 g100 mL/hr over 30 Minutes Intravenous 3 times per day 06/11/14 2236 06/13/14 1559   06/11/14 2215  fluconazole (DIFLUCAN) tablet 400 mg     400 mg Oral Daily 06/11/14 2201     06/11/14 2215  vancomycin (VANCOCIN) 1,250 mg in sodium chloride 0.9 % 250 mL IVPB  Status:  Discontinued     1,250 mg166.7 mL/hr over 90 Minutes Intravenous Every 8 hours 06/11/14 2201 06/11/14 2235   06/11/14 2215  atovaquone (MEPRON) 750 MG/5ML suspension 750 mg  Status:  Discontinued     750 mg Oral Daily 06/11/14 2201 06/18/14 1758   06/11/14 2215  Darunavir Ethanolate (PREZISTA) tablet 800 mg  Status:  Discontinued     800 mg Oral Daily 06/11/14 2201 06/18/14 1141   06/11/14 2215  emtricitabine-tenofovir (TRUVADA) 200-300 MG per  tablet 1 tablet     1 tablet Oral Daily at bedtime 06/11/14 2201        Medications: Scheduled Meds: . amoxicillin-clavulanate  1 tablet Oral Q12H  . antiseptic oral rinse  7 mL Mouth Rinse BID  . azithromycin  1,200 mg Oral Weekly  . buPROPion  300 mg Oral Daily  . Darunavir Ethanolate  800 mg Oral Q breakfast  . docusate sodium  100 mg Oral BID  . emtricitabine-tenofovir  1 tablet Oral QHS  . feeding supplement (RESOURCE BREEZE)  1 Container Oral TID BM  . ferrous sulfate  325 mg Oral BID WC  . fluconazole  400 mg Oral Daily  . lactulose  10 g Oral BID  . morphine  60 mg Oral Q12H  . pantoprazole  40 mg Oral Daily  . polyethylene glycol  17 g Oral Daily  . ritonavir  100 mg Oral Q breakfast  . senna-docusate  2 tablet Oral Daily   Continuous Infusions:  PRN Meds:.acetaminophen **OR** acetaminophen, ALPRAZolam, antiseptic oral rinse, clonazePAM, LORazepam, menthol-cetylpyridinium, methocarbamol, morphine, ondansetron **OR** ondansetron (ZOFRAN) IV, promethazine **OR** promethazine **OR** promethazine, sodium chloride, sucralfate    Objective: Weight change:   Intake/Output Summary (Last 24 hours) at 06/19/14 1337  Last data filed at 06/19/14 0500  Gross per 24 hour  Intake      0 ml  Output    550 ml  Net   -550 ml   Blood pressure 106/52, pulse 105, temperature 99 F (37.2 C), temperature source Axillary, resp. rate 18, height 5\' 9"  (1.753 m), weight 166 lb 0.1 oz (75.3 kg), SpO2 94 %. Temp:  [99 F (37.2 C)-101 F (38.3 C)] 99 F (37.2 C) (01/12 0457) Pulse Rate:  [78-105] 105 (01/12 0457) Resp:  [18-20] 18 (01/12 0457) BP: (106-110)/(52-63) 106/52 mmHg (01/12 0457) SpO2:  [93 %-94 %] 94 % (01/12 0457)  Physical Exam: General: Slumped over in bed on his side speaking in very soft voice oriented to place and person and location HEENT: , EOMI,  CVS regular rate, normal r, no murmur rubs or gallops Chest: no wheezing, rales or rhonchi GI: Abdomen is  protuberant and tender to palpation diffusely. Neuro: nonfocal, strength and sensation intact CBC:  CBC Latest Ref Rng 06/19/2014 06/18/2014 06/16/2014  WBC 4.0 - 10.5 K/uL 2.4(L) 1.9(L) 2.0(L)  Hemoglobin 13.0 - 17.0 g/dL 8.6(L) 7.6(L) 8.0(L)  Hematocrit 39.0 - 52.0 % 26.2(L) 23.8(L) 24.8(L)  Platelets 150 - 400 K/uL 62(L) 71(L) 73(L)      BMET  Recent Labs  06/18/14 0606 06/19/14 0440  NA 132* 132*  K 3.8 3.8  CL 105 104  CO2 26 23  GLUCOSE 79 94  BUN 5* 5*  CREATININE 0.50 0.65  CALCIUM 7.6* 7.8*     Liver Panel   Recent Labs  06/18/14 0606 06/19/14 0440  PROT 5.6* 6.1  ALBUMIN 1.5* 1.5*  AST 46* 57*  ALT 20 22  ALKPHOS 102 98  BILITOT 1.9* 3.1*       Sedimentation Rate No results for input(s): ESRSEDRATE in the last 72 hours. C-Reactive Protein No results for input(s): CRP in the last 72 hours.  Micro Results: Recent Results (from the past 720 hour(s))  MRSA PCR Screening     Status: None   Collection Time: 06/11/14  8:57 PM  Result Value Ref Range Status   MRSA by PCR NEGATIVE NEGATIVE Final    Comment:        The GeneXpert MRSA Assay (FDA approved for NASAL specimens only), is one component of a comprehensive MRSA colonization surveillance program. It is not intended to diagnose MRSA infection nor to guide or monitor treatment for MRSA infections.   Culture, blood (routine x 2)     Status: None   Collection Time: 06/11/14 10:23 PM  Result Value Ref Range Status   Specimen Description BLOOD LEFT HAND  Final   Special Requests BOTTLES DRAWN AEROBIC AND ANAEROBIC 5CC EACH  Final   Culture   Final    NO GROWTH 5 DAYS Performed at [facility]    Report Status 06/18/2014 FINAL  Final  Culture, blood (routine x 2)     Status: None   Collection Time: 06/11/14 10:31 PM  Result Value Ref Range Status   Specimen Description BLOOD LEFT WRIST  Final   Special Requests BOTTLES DRAWN AEROBIC AND ANAEROBIC 5CC EACH  Final   Culture    Final    NO GROWTH 5 DAYS Performed at [facility]    Report Status 06/18/2014 FINAL  Final    Studies/Results: Dg Chest 1 View  06/19/2014   CLINICAL DATA:  Fever.  EXAM: CHEST - 1 VIEW  COMPARISON:  06/11/2014.  FINDINGS: Right PICC line stable  position. Cardiomegaly. Diffuse bilateral from interstitial prominence noted. These suggest congestive heart failure. Pneumonitis cannot be excluded. No pleural effusion or pneumothorax no acute bony abnormality.  IMPRESSION: 1. Right PICC line in stable position. 2. Cardiomegaly with mild bilateral pulmonary interstitial prominence suggesting mild congestive heart failure. Pneumonitis cannot be excluded.   Electronically Signed   By: Marcello Moores  Register   On: 06/19/2014 12:37                    Assessment/Plan:  Principal Problem:   Acute encephalopathy Active Problems:   AIDS   Cirrhosis, non-alcoholic   Pancytopenia   Lumbar discitis   Thoracic discitis   Enterococcal bacteremia   Vancomycin resistant Enterococcus   Candida albicans infection   HIV disease   HCAP (healthcare-associated pneumonia)   Sepsis due to other etiology   Iliac crest osteosclerosis   Osteomyelitis   Abscess    Minnie Shi is a 39 y.o. male with  Levern Pitter is a 39 y.o. 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 We took him off antibiotics for a week prior to surgery and he is now sp L5-S1 microdiscectomy with nerve root  discetomy and biopsies mx for culture by Dr. Saintclair Halsted. Operative cultures failed to grow any bacterial organisms or fungal cultures at 4 weeks if the cultures are still pending. He was on placed on micafungin and vancomycin has remained on these for 5 weeks then vancomycin and high fluconazole but with worsening back pain prior even to the switch. MRI without contrast showed progressive discitis at the L3 L4 region collapse of vertebra at T11-12 and questionable ventral epidural thickening BUT NOT ABSCESS near surgical sitein sacral region. MRI with contrast read albeit with motion degradation as showing enhancement felt to be more c/w chronic osteomyelitis. and also shows finding in iliac bone on the right suggestive of osteomyelitis but this is not borne out by dedicated pelvis MRI which DOES show persistent but improved septic arthritis on the right  Now after nearly a week or more OFF antibiotics he has had one fever  #1 Fever: --would check blood cultures x 2 sites --dc PICC and culture tip  --CXR 2 view    #2 Discitis by MR imaging in the L 3 L4 , L5, not clearly acute by MRI WITH contrast as opposed to WITHOUT. I WANT TO CORRECT MIS-INTERPRETATION I MADE YESTERDAY IN THAT THE MRI WITHOUT CONTRAST WHILE SHOWNG VENTRAL EPIDURAL THICKENING DID NOT SHOW AN ABSCESS  Everything here looks stable to improved  Will continue fluconazole and change to oral augmentin   #2 Prior RIght hip synovitis now with ? Osteo on MRI of the ILIAC,  MRI showed now osteo in iliac and improving yet persistent septic arthritis right hip  --I will place him on oral augmentin which would cover the AMP sensitive enterococcus that originally was isolated from blood culture 2/2 and which I suspect is prime suspect besides his Candida that grew from one disk space aspirate  I will plan on continuing the fluconazole  indefinitely, the augmentin for another 1-2 months with serial exams in clinic and consideration for repeat  imaging  #3 HIV continue his current regimen is quite well controlled       LOS: 8 days   Alcide Evener 06/19/2014, 1:37 PM

## 2014-06-19 NOTE — Progress Notes (Signed)
PROGRESS NOTE  Samuel Schultz TKZ:[id number] DOB: 01/01/1976 DOA: 06/11/2014 PCP: [name], MD  HPI: 39 y.o. M w/ Hx AIDS, episodes of AMPICILLIN sensitive enterococcal bacteremial, VRE, Candida Albicans isolated on aspirate from 01/31/14, cirrhosis of the liver, lymphoma, and pancytopenia who was taken to the ER at [facility] from rehabilitation after patient was found to be increasingly lethargic. Patient also was found to be hypoxic and mildly febrile in the ER. Chest x-ray did not show any definite infiltrates. Patient has chronic indwelling PICC line. Patient was complaining of increasing back pain and initial MRI suggested worsening discitis. Patient was transferred to [facility] for further management.   Subjective / 24 H Interval events - endorses feeling weak and tired, also is nauseated and has back pain this am.  Assessment/Plan: Principal Problem:   Acute encephalopathy Active Problems:   AIDS   Cirrhosis, non-alcoholic   Pancytopenia   Lumbar discitis   Thoracic discitis   Enterococcal bacteremia   Vancomycin resistant Enterococcus   Candida albicans infection   HIV disease   HCAP (healthcare-associated pneumonia)   Sepsis due to other etiology   Iliac crest osteosclerosis   Osteomyelitis   Abscess  Acute encephalopathy - resolved - resolved - felt to be due to infection (chronic osteo, possible PICC infection) and hepatic encephalopathy - ammonia now normal   SIRS v/s Sepsis of unclear etiology - ?osteomyelitis - ?PICC infection  -Patient met criteria for SIRS with WBC<4 + HR>90 - abx tx per ID - no evidence of bacteremia at this time   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 -Had fever last night. Will DC PICC line and reculture blood.   AIDS -last CD4 count  around 50 but per Dr. Megan Salon patient's viral load was undetectable - care as per ID Team - HIV currently appears well controlled   Hyponatremia  -improving w/ volume expansion - cont IVF and follow   Pancytopenia -felt to be due to tx of lymphoma and AIDS - counts are stable/slightly improved  Nonalcoholic cirrhosis of the liver -secondary to lymphoma involving the liver and CHOP therapy - ammonia has normalized w/ lactulose   Diet: Diet regular Fluids: none DVT Prophylaxis: SCD  Code Status: Full Code Family Communication: none this morning  Disposition Plan: remain inpatient, SNF when ready   Consultants:  ID  Procedures:  none   Antibiotics Cefepime 1/4 > 1/6 Vancomycin 1/4 > 1/6 Fluconazole 1/5 > Augmentin   Studies  No results found.  Objective  Filed Vitals:   06/18/14 1300 06/18/14 1928 06/18/14 1932 06/19/14 0457  BP: 91/53 110/63  106/52  Pulse: 92 78  105  Temp: 98.4 F (36.9 C) 101 F (38.3 C) 100.5 F (38.1 C) 99 F (37.2 C)  TempSrc:  Oral Axillary Axillary  Resp: 18 20  18   Height:      Weight:      SpO2: 97% 93%  94%    Intake/Output Summary (Last 24 hours) at 06/19/14 0932 Last data filed at 06/19/14 0500  Gross per 24 hour  Intake    240 ml  Output    950 ml  Net   -710 ml   Filed Weights   06/11/14 2051  Weight: 75.3 kg (166 lb 0.1 oz)    Exam:  General:  NAD  HEENT: mild  scleral icterus  Cardiovascular: RRR without MRG  Respiratory: no wheezing, moves air well   Abdomen: soft, nontender  MSK/Extremities: no cyanosis  Skin: no rashes  Neuro: non focal  Data Reviewed: Basic Metabolic Panel:  Recent Labs Lab 06/14/14 0328 06/16/14 0414 06/18/14 0606 06/19/14 0440  NA 130* 132* 132* 132*  K 3.7 3.7 3.8 3.8  CL 105 105 105 104  CO2 22 22 26 23   GLUCOSE 78 74 79 94  BUN 7 5* 5* 5*  CREATININE 0.54 0.52 0.50 0.65  CALCIUM 7.7* 7.6* 7.6* 7.8*   Liver Function Tests:  Recent Labs Lab 06/14/14 0328  06/16/14 0414 06/18/14 0606 06/19/14 0440  AST 50* 49* 46* 57*  ALT 22 20 20 22   ALKPHOS 105 106 102 98  BILITOT 2.3* 2.1* 1.9* 3.1*  PROT 5.6* 5.9* 5.6* 6.1  ALBUMIN 1.5* 1.6* 1.5* 1.5*   No results for input(s): LIPASE, AMYLASE in the last 168 hours.  Recent Labs Lab 06/13/14 0500 06/14/14 0328  AMMONIA 64* 30   CBC:  Recent Labs Lab 06/14/14 0328 06/14/14 1222 06/16/14 0414 06/18/14 0606 06/19/14 0440  WBC 2.0*  --  2.0* 1.9* 2.4*  HGB 6.9* 8.1* 8.0* 7.6* 8.6*  HCT 20.8* 23.9* 24.8* 23.8* 26.2*  MCV 100.5*  --  100.0 101.3* 104.0*  PLT 72*  --  73* 71* 62*   CBG:  Recent Labs Lab 06/13/14 1257  GLUCAP 121*    Recent Results (from the past 240 hour(s))  MRSA PCR Screening     Status: None   Collection Time: 06/11/14  8:57 PM  Result Value Ref Range Status   MRSA by PCR NEGATIVE NEGATIVE Final    Comment:        The GeneXpert MRSA Assay (FDA approved for NASAL specimens only), is one component of a comprehensive MRSA colonization surveillance program. It is not intended to diagnose MRSA infection nor to guide or monitor treatment for MRSA infections.   Culture, blood (routine x 2)     Status: None   Collection Time: 06/11/14 10:23 PM  Result Value Ref Range Status   Specimen Description BLOOD LEFT HAND  Final   Special Requests BOTTLES DRAWN AEROBIC AND ANAEROBIC 5CC EACH  Final   Culture   Final    NO GROWTH 5 DAYS Performed at [facility]    Report Status 06/18/2014 FINAL  Final  Culture, blood (routine x 2)     Status: None   Collection Time: 06/11/14 10:31 PM  Result Value Ref Range Status   Specimen Description BLOOD LEFT WRIST  Final   Special Requests BOTTLES DRAWN AEROBIC AND ANAEROBIC 5CC EACH  Final   Culture   Final    NO GROWTH 5 DAYS Performed at [facility]    Report Status 06/18/2014 FINAL  Final     Scheduled Meds: . amoxicillin-clavulanate  1 tablet Oral Q12H  . antiseptic oral rinse  7 mL Mouth  Rinse BID  . azithromycin  1,200 mg Oral Weekly  . buPROPion  300 mg Oral Daily  . Darunavir Ethanolate  800 mg Oral Q breakfast  . docusate sodium  100 mg Oral BID  . emtricitabine-tenofovir  1 tablet Oral QHS  . feeding supplement (RESOURCE BREEZE)  1 Container Oral TID BM  . ferrous sulfate  325 mg Oral BID WC  . fluconazole  400 mg Oral Daily  . lactulose  10 g Oral BID  . morphine  60 mg Oral Q12H  .  pantoprazole  40 mg Oral Daily  . polyethylene glycol  17 g Oral Daily  . ritonavir  100 mg Oral Q breakfast  . senna-docusate  2 tablet Oral Daily   Continuous Infusions:   Domingo Mend, MD [facility] Pager [phone]. If 7 PM - 7 AM, please contact night-coverage at [email or web address], password [email or web address] 06/19/2014, 9:32 AM  LOS: 8 days

## 2014-06-19 NOTE — Clinical Social Work Note (Signed)
Patient transferred to 5N.  Patient is from [facility] and will return once medically stable.  CSW confirmed bed availability with SNF.  Patient is agreeable to return.  Nonnie Done, [place] [phone]  Psychiatric & Orthopedics (5N 1-16) Clinical Social Worker

## 2014-06-20 DIAGNOSIS — R627 Adult failure to thrive: Secondary | ICD-10-CM | POA: Insufficient documentation

## 2014-06-20 DIAGNOSIS — F329 Major depressive disorder, single episode, unspecified: Secondary | ICD-10-CM | POA: Insufficient documentation

## 2014-06-20 DIAGNOSIS — R188 Other ascites: Secondary | ICD-10-CM | POA: Insufficient documentation

## 2014-06-20 DIAGNOSIS — R6251 Failure to thrive (child): Secondary | ICD-10-CM | POA: Insufficient documentation

## 2014-06-20 DIAGNOSIS — F32A Depression, unspecified: Secondary | ICD-10-CM | POA: Insufficient documentation

## 2014-06-20 LAB — SODIUM, URINE, RANDOM: SODIUM UR: 22 mmol/L

## 2014-06-20 LAB — BASIC METABOLIC PANEL
Anion gap: 3 — ABNORMAL LOW (ref 5–15)
BUN: 7 mg/dL (ref 6–23)
CALCIUM: 7.8 mg/dL — AB (ref 8.4–10.5)
CO2: 23 mmol/L (ref 19–32)
CREATININE: 0.55 mg/dL (ref 0.50–1.35)
Chloride: 99 mEq/L (ref 96–112)
GFR calc non Af Amer: >90 mL/min (ref >90)
Glucose, Bld: 99 mg/dL (ref 70–99)
Potassium: 3.1 mmol/L — ABNORMAL LOW (ref 3.5–5.1)
Sodium: 125 mmol/L — ABNORMAL LOW (ref 135–145)

## 2014-06-20 LAB — URINALYSIS, ROUTINE W REFLEX MICROSCOPIC
Glucose, UA: NEGATIVE mg/dL
HGB URINE DIPSTICK: NEGATIVE
Ketones, ur: NEGATIVE mg/dL
Leukocytes, UA: NEGATIVE
NITRITE: POSITIVE — AB
PROTEIN: NEGATIVE mg/dL
SPECIFIC GRAVITY, URINE: 1.025 (ref 1.005–1.030)
UROBILINOGEN UA: 0.2 mg/dL (ref 0.0–1.0)
pH: 6 (ref 5.0–8.0)

## 2014-06-20 LAB — CBC
HEMATOCRIT: 26.8 % — AB (ref 39.0–52.0)
HEMOGLOBIN: 9.1 g/dL — AB (ref 13.0–17.0)
MCH: 33.7 pg (ref 26.0–34.0)
MCHC: 34 g/dL (ref 30.0–36.0)
MCV: 99.3 fL (ref 78.0–100.0)
Platelets: 66 10*3/uL — ABNORMAL LOW (ref 150–400)
RBC: 2.7 MIL/uL — AB (ref 4.22–5.81)
RDW: 19.2 % — ABNORMAL HIGH (ref 11.5–15.5)
WBC: 1.6 10*3/uL — AB (ref 4.0–10.5)

## 2014-06-20 LAB — CLOSTRIDIUM DIFFICILE BY PCR: CDIFFPCR: NEGATIVE

## 2014-06-20 LAB — CREATININE, URINE, RANDOM: CREATININE, URINE: 134.49 mg/dL

## 2014-06-20 LAB — URINE MICROSCOPIC-ADD ON

## 2014-06-20 LAB — OSMOLALITY, URINE: Osmolality, Ur: 694 mOsm/kg (ref 390–1090)

## 2014-06-20 LAB — OSMOLALITY: Osmolality: 273 mOsm/kg — ABNORMAL LOW (ref 275–300)

## 2014-06-20 LAB — URIC ACID: Uric Acid, Serum: 3.2 mg/dL — ABNORMAL LOW (ref 4.0–7.8)

## 2014-06-20 MED ORDER — TAMSULOSIN HCL 0.4 MG PO CAPS
0.4000 mg | ORAL_CAPSULE | Freq: Every day | ORAL | Status: DC
  Filled 2014-06-20 (×2): qty 1

## 2014-06-20 MED ORDER — HALOPERIDOL LACTATE 5 MG/ML IJ SOLN: 1.0000 mg | Freq: Four times a day (QID) | INTRAMUSCULAR | Status: DC | PRN

## 2014-06-20 MED ORDER — LOPERAMIDE HCL 2 MG PO CAPS
2.0000 mg | ORAL_CAPSULE | Freq: Four times a day (QID) | ORAL | Status: DC | PRN
Start: 2014-06-20 — End: 2014-06-21
  Administered 2014-06-20 – 2014-06-21 (×2): 2 mg via ORAL
  Filled 2014-06-20 (×4): qty 1

## 2014-06-20 MED ORDER — GATORADE (BH): 240.0000 mL | Freq: Three times a day (TID) | ORAL | Status: DC

## 2014-06-20 MED ORDER — FLUCONAZOLE IN SODIUM CHLORIDE 400-0.9 MG/200ML-% IV SOLN
400.0000 mg | INTRAVENOUS | Status: DC
  Administered 2014-06-20: 400 mg via INTRAVENOUS
  Filled 2014-06-20 (×2): qty 200

## 2014-06-20 MED ORDER — ALPRAZOLAM 0.25 MG PO TABS
0.2500 mg | ORAL_TABLET | Freq: Every day | ORAL | Status: DC
  Administered 2014-06-20: 0.25 mg via ORAL
  Filled 2014-06-20: qty 1

## 2014-06-20 MED ORDER — LORAZEPAM 2 MG/ML IJ SOLN
0.5000 mg | INTRAMUSCULAR | Status: DC | PRN
  Administered 2014-06-21: 0.5 mg via INTRAVENOUS
  Filled 2014-06-20: qty 1

## 2014-06-20 MED ORDER — POTASSIUM CHLORIDE CRYS ER 20 MEQ PO TBCR
40.0000 meq | EXTENDED_RELEASE_TABLET | Freq: Once | ORAL | Status: DC
  Filled 2014-06-20: qty 2

## 2014-06-20 MED ORDER — GATORADE (BH)
240.0000 mL | Freq: Three times a day (TID) | ORAL | Status: DC
  Filled 2014-06-20: qty 480

## 2014-06-20 MED ORDER — SODIUM CHLORIDE 0.9 % IV SOLN
INTRAVENOUS | Status: AC
  Administered 2014-06-20 (×2): via INTRAVENOUS

## 2014-06-20 NOTE — Clinical Social Work Note (Signed)
CSW updated SNF- [facility]  Nonnie Done, [place] [phone]  Psychiatric & Orthopedics (5N 1-16) Clinical Social Worker

## 2014-06-20 NOTE — Progress Notes (Signed)
Samuel Schultz for Infectious Disease    Subjective:   Patient is not taking most of his oral medicines including his antiretrovirals today he feels poorly and has abdominal pain malaise. I suggested getting a CT of his abdomen but he stated that he would not do this till tomorrow. He did not want me to call his godmother today.   Antibiotics:  Anti-infectives    Start     Dose/Rate Route Frequency Ordered Stop   06/19/14 0800  Darunavir Ethanolate (PREZISTA) tablet 800 mg     800 mg Oral Daily with breakfast 06/18/14 1141     06/18/14 2200  amoxicillin-clavulanate (AUGMENTIN) 875-125 MG per tablet 1 tablet     1 tablet Oral Every 12 hours 06/18/14 1758     06/13/14 1700  vancomycin (VANCOCIN) IVPB 1000 mg/200 mL premix  Status:  Discontinued     1,000 mg200 mL/hr over 60 Minutes Intravenous Every 8 hours 06/13/14 1640 06/13/14 1644   06/12/14 1000  azithromycin (ZITHROMAX) tablet 1,200 mg     1,200 mg Oral Weekly 06/11/14 2201     06/12/14 0700  ritonavir (NORVIR) capsule 100 mg     100 mg Oral Daily with breakfast 06/11/14 2201     06/12/14 0000  vancomycin (VANCOCIN) IVPB 750 mg/150 ml premix  Status:  Discontinued     750 mg150 mL/hr over 60 Minutes Intravenous Every 8 hours 06/11/14 2236 06/13/14 1559   06/11/14 2300  ceFEPIme (MAXIPIME) 1 g in dextrose 5 % 50 mL IVPB  Status:  Discontinued     1 g100 mL/hr over 30 Minutes Intravenous 3 times per day 06/11/14 2236 06/13/14 1559   06/11/14 2215  fluconazole (DIFLUCAN) tablet 400 mg     400 mg Oral Daily 06/11/14 2201     06/11/14 2215  vancomycin (VANCOCIN) 1,250 mg in sodium chloride 0.9 % 250 mL IVPB  Status:  Discontinued     1,250 mg166.7 mL/hr over 90 Minutes Intravenous Every 8 hours 06/11/14 2201 06/11/14 2235   06/11/14 2215  atovaquone (MEPRON) 750 MG/5ML suspension 750 mg  Status:  Discontinued     750 mg Oral Daily 06/11/14 2201 06/18/14 1758   06/11/14 2215  Darunavir Ethanolate (PREZISTA) tablet 800 mg  Status:   Discontinued     800 mg Oral Daily 06/11/14 2201 06/18/14 1141   06/11/14 2215  emtricitabine-tenofovir (TRUVADA) 200-300 MG per tablet 1 tablet     1 tablet Oral Daily at bedtime 06/11/14 2201        Medications: Scheduled Meds: . ALPRAZolam  0.25 mg Oral QHS  . amoxicillin-clavulanate  1 tablet Oral Q12H  . azithromycin  1,200 mg Oral Weekly  . buPROPion  300 mg Oral Daily  . Darunavir Ethanolate  800 mg Oral Q breakfast  . emtricitabine-tenofovir  1 tablet Oral QHS  . feeding supplement (RESOURCE BREEZE)  1 Container Oral TID BM  . ferrous sulfate  325 mg Oral BID WC  . fluconazole  400 mg Oral Daily  . lactose free nutrition  237 mL Oral BID BM  . morphine  60 mg Oral Q12H  . pantoprazole  40 mg Oral Daily  . ritonavir  100 mg Oral Q breakfast  . tamsulosin  0.4 mg Oral Daily   Continuous Infusions: . sodium chloride Stopped (06/20/14 1731)   PRN Meds:.acetaminophen **OR** [DISCONTINUED] acetaminophen, antiseptic oral rinse, haloperidol lactate, loperamide, LORazepam, methocarbamol, [DISCONTINUED] ondansetron **OR** ondansetron (ZOFRAN) IV, sodium chloride, sucralfate    Objective: Weight  change:   Intake/Output Summary (Last 24 hours) at 06/20/14 1809 Last data filed at 06/20/14 1700  Gross per 24 hour  Intake    120 ml  Output    700 ml  Net   -580 ml   Blood pressure 114/71, pulse 100, temperature 98.4 F (36.9 C), temperature source Oral, resp. rate 14, height 5\' 9"  (1.753 m), weight 166 lb 0.1 oz (75.3 kg), SpO2 99 %. Temp:  [97.8 F (36.6 C)-98.8 F (37.1 C)] 98.4 F (36.9 C) (01/13 1302) Pulse Rate:  [98-100] 100 (01/13 1302) Resp:  [14-18] 14 (01/13 1302) BP: (101-114)/(58-71) 114/71 mmHg (01/13 1302) SpO2:  [96 %-99 %] 99 % (01/13 1302)  Physical Exam: General: Dysphoric lying on back speaking in very soft voice oriented to place and person and location HEENT: , EOMI,  CVS regular rate, normal r, no murmur rubs or gallops Chest: no wheezing,  rales or rhonchi GI: Abdomen is protuberant and distended and diffusely tender Neuro: nonfocal, strength and sensation intact CBC:  CBC Latest Ref Rng 06/20/2014 06/19/2014 06/18/2014  WBC 4.0 - 10.5 K/uL 1.6(L) 2.4(L) 1.9(L)  Hemoglobin 13.0 - 17.0 g/dL 9.1(L) 8.6(L) 7.6(L)  Hematocrit 39.0 - 52.0 % 26.8(L) 26.2(L) 23.8(L)  Platelets 150 - 400 K/uL 66(L) 62(L) 71(L)      BMET  Recent Labs  06/19/14 0440 06/20/14 0520  NA 132* 125*  K 3.8 3.1*  CL 104 99  CO2 23 23  GLUCOSE 94 99  BUN 5* 7  CREATININE 0.65 0.55  CALCIUM 7.8* 7.8*     Liver Panel   Recent Labs  06/18/14 0606 06/19/14 0440  PROT 5.6* 6.1  ALBUMIN 1.5* 1.5*  AST 46* 57*  ALT 20 22  ALKPHOS 102 98  BILITOT 1.9* 3.1*       Sedimentation Rate No results for input(s): ESRSEDRATE in the last 72 hours. C-Reactive Protein No results for input(s): CRP in the last 72 hours.  Micro Results: Recent Results (from the past 720 hour(s))  MRSA PCR Screening     Status: None   Collection Time: 06/11/14  8:57 PM  Result Value Ref Range Status   MRSA by PCR NEGATIVE NEGATIVE Final    Comment:        The GeneXpert MRSA Assay (FDA approved for NASAL specimens only), is one component of a comprehensive MRSA colonization surveillance program. It is not intended to diagnose MRSA infection nor to guide or monitor treatment for MRSA infections.   Culture, blood (routine x 2)     Status: None   Collection Time: 06/11/14 10:23 PM  Result Value Ref Range Status   Specimen Description BLOOD LEFT HAND  Final   Special Requests BOTTLES DRAWN AEROBIC AND ANAEROBIC 5CC EACH  Final   Culture   Final    NO GROWTH 5 DAYS Performed at [facility]    Report Status 06/18/2014 FINAL  Final  Culture, blood (routine x 2)     Status: None   Collection Time: 06/11/14 10:31 PM  Result Value Ref Range Status   Specimen Description BLOOD LEFT WRIST  Final   Special Requests BOTTLES DRAWN AEROBIC AND ANAEROBIC  5CC EACH  Final   Culture   Final    NO GROWTH 5 DAYS Performed at [facility]    Report Status 06/18/2014 FINAL  Final  Cath Tip Culture     Status: None (Preliminary result)   Collection Time: 06/19/14 10:45 AM  Result Value Ref Range Status  Specimen Description CATH TIP PICC LINE  Final   Special Requests ARM  Final   Culture   Final    NO GROWTH 1 DAY Performed at [facility]    Report Status PENDING  Incomplete  Culture, blood (routine x 2)     Status: None (Preliminary result)   Collection Time: 06/19/14 10:55 AM  Result Value Ref Range Status   Specimen Description BLOOD LEFT HAND  Final   Special Requests BOTTLES DRAWN AEROBIC ONLY 10CC  Final   Culture   Final           BLOOD CULTURE RECEIVED NO GROWTH TO DATE CULTURE WILL BE HELD FOR 5 DAYS BEFORE ISSUING A FINAL NEGATIVE REPORT Performed at [facility]    Report Status PENDING  Incomplete  Culture, blood (routine x 2)     Status: None (Preliminary result)   Collection Time: 06/19/14 11:05 AM  Result Value Ref Range Status   Specimen Description BLOOD RIGHT HAND  Final   Special Requests   Final    BOTTLES DRAWN AEROBIC AND ANAEROBIC 10CC [place]  5CC RED   Culture   Final           BLOOD CULTURE RECEIVED NO GROWTH TO DATE CULTURE WILL BE HELD FOR 5 DAYS BEFORE ISSUING A FINAL NEGATIVE REPORT Performed at [facility]    Report Status PENDING  Incomplete  Clostridium Difficile by PCR     Status: None   Collection Time: 06/19/14  8:56 PM  Result Value Ref Range Status   C difficile by pcr NEGATIVE NEGATIVE Final    Studies/Results: Dg Chest 1 View  06/19/2014   CLINICAL DATA:  Fever.  EXAM: CHEST - 1 VIEW  COMPARISON:  06/11/2014.  FINDINGS: Right PICC line stable position. Cardiomegaly. Diffuse bilateral from interstitial prominence noted. These suggest congestive heart failure. Pneumonitis cannot be excluded. No pleural effusion or pneumothorax no acute bony abnormality.   IMPRESSION: 1. Right PICC line in stable position. 2. Cardiomegaly with mild bilateral pulmonary interstitial prominence suggesting mild congestive heart failure. Pneumonitis cannot be excluded.   Electronically Signed   By: Marcello Moores  Register   On: 06/19/2014 12:37                    Assessment/Plan:  Principal Problem:   Acute encephalopathy Active Problems:   AIDS   Cirrhosis, non-alcoholic   Pancytopenia   Lumbar discitis   Thoracic discitis   Enterococcal bacteremia   Vancomycin resistant Enterococcus   Candida albicans infection   HIV disease   HCAP (healthcare-associated pneumonia)   Sepsis due to other etiology   Iliac crest osteosclerosis   Osteomyelitis   Abscess   FUO (fever of unknown origin)    Samuel Schultz is a 39 y.o. male with  Samuel Schultz is a 39 y.o. 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 We took him off antibiotics for a week prior to surgery and he is now sp L5-S1 microdiscectomy with nerve root discetomy and biopsies mx  for culture by Dr. Saintclair Halsted. Operative cultures failed to grow any bacterial organisms or fungal cultures at 4 weeks if the cultures are still pending. He was on placed on micafungin and vancomycin has remained on these for 5 weeks then vancomycin and high fluconazole but with worsening back pain prior even to the switch. MRI without contrast showed progressive discitis at the L3 L4 region collapse of vertebra at T11-12 and  questionable ventral epidural thickening BUT NOT ABSCESS near surgical sitein sacral region. MRI with contrast read albeit with motion degradation as showing enhancement felt to be more c/w chronic osteomyelitis. and also shows finding in iliac bone on the right suggestive of osteomyelitis but this is not borne out by dedicated pelvis MRI which DOES show persistent but improved septic arthritis on the right  Now after nearly a week or more OFF antibiotics he has had one fever  #1 Fever: Would recommend checking CT abdomen and pelvis with oral and IV contrast to look for occult infection. He could certainly have SBP   #2 Discitis by MR imaging in the L 3 L4 , L5, not clearly acute by MRI WITH contrast as opposed to WITHOUT. I WANT TO CORRECT MIS-INTERPRETATION I MADE YESTERDAY IN THAT THE MRI WITHOUT CONTRAST WHILE SHOWNG VENTRAL EPIDURAL THICKENING DID NOT SHOW AN ABSCESS  Everything here looks stable to improved  He is on  fluconazole and augmentin if he will take them, I will change the fluconazole to IV form   #2 Prior RIght hip synovitis now with ? Osteo on MRI of the ILIAC,  MRI showed now osteo in iliac and improving yet persistent septic arthritis right hip  See above discussion  I will plan on continuing the fluconazole indefinitely, the augmentin for another 1-2 months with serial exams in clinic and consideration for repeat imaging  #3 HIV continue his current regimen is quite well controlled  #4 Goals of care: Given this patients recent extremely unfavorable recent trajectory and apparent poor quality of life it might be wise to have palliative care consult. Certainly his HIV is completely controllable and that his not the issue. However he seems extremely deconditioned and he seems to have a great deal of difficulty trying to overcome infections and is disc space likely in part due to his pancytopenia that occurred after treatment of his non-Hodgkin's lymphoma. I also wonder whether  depression is playing a role in his failure to thrive and perhaps psychiatry consult would be helpful. [facility] = HCPOA  will need to be involved ultimately      LOS: 9 days   Alcide Evener 06/20/2014, 6:09 PM

## 2014-06-20 NOTE — Evaluation (Signed)
Physical Therapy Evaluation Patient Details Name: Jeren Dufrane MRN: [id number] DOB: 02-10-1976 Today's Date: 06/20/2014   History of Present Illness   Samuel Schultz is a 39 y.o. male with history of AIDS, cirrhosis of the liver, history of lymphoma and pancytopenia was brought to the ER at [facility] from rehabilitation after patient was found to be increasingly lethargic. Patient also was found to be hypoxic and mildly febrile in the ER. Pt with h/o HIV/AIDS, depressive d/o, and PTSD. Also cirrhosis of liver. Pt with back surgery in November and had TLSO in room.    Clinical Impression  Pt with call light on upon PT arrival, when asked what he needed pt replied "it's about time. I need changed." Once PT began to assist pt for hygiene pt adamantly refused any OOB mobility. Pt ignored therapist and would not answer questions and repeatedly stated "I'm not getting up." Pt encouraged and educated extensively on importance of OOB mobility to build strength, prevent blood clots, and decrease risk of pneumonia. Pt rolled left to right in avoidance of PT. PT then attempted to change depends however it was not soiled and patient's response was "I didn't come out yet." "I just wanted to be left alone." Pt appears depressed and refused to participate in OOB mobility. Pt however would benefit from PT to improve functional mobility as patient is only 38 and severely deconditioned. Pt con't to be appropriate for SNF Upon d/c.    Follow Up Recommendations SNF    Equipment Recommendations  None recommended by PT    Recommendations for Other Services       Precautions / Restrictions Precautions Precautions: Fall Precaution Comments: pt fell from side of bed early today. Restrictions Weight Bearing Restrictions: No      Mobility  Bed Mobility Overal bed mobility: Modified Independent             General bed mobility comments: pt able to roll L/R mod I with use of bed  rail  Transfers                 General transfer comment: pt adamantly refused to sit EOB or transfer OOB. PT initiated LEs off EOB and pt pulled LEs back onto bed  Ambulation/Gait                Stairs            Wheelchair Mobility    Modified Rankin (Stroke Patients Only)       Balance                                             Pertinent Vitals/Pain Pain Assessment:  (reports back pain but did not rate)    Home Living Family/patient expects to be discharged to:: [facility]illed nursing [facility]                  Additional Comments: pt reports undergoing therapy at [facility]    Prior Function Level of Independence: Needs assistance   Gait / Transfers Assistance Needed: pt reports only walking with therapy           Hand Dominance   Dominant Hand: Right    Extremity/Trunk Assessment   Upper Extremity Assessment:  (observed pt using functioning during rolling )           Lower Extremity Assessment:  (observed pt moving  LEs in bed but wouldn't allow MMT)         Communication   Communication:  (extremely soft spoken, difficulty to understanding)  Cognition Arousal/Alertness: Lethargic Behavior During Therapy: Flat affect Overall Cognitive Status: Difficult to assess                      General Comments      Exercises        Assessment/Plan    PT Assessment Patient needs continued PT services  PT Diagnosis Generalized weakness   PT Problem List Decreased strength;Decreased activity tolerance;Decreased balance;Decreased mobility  PT Treatment Interventions DME instruction;Gait training;Stair training;Functional mobility training;Therapeutic activities;Therapeutic exercise   PT Goals (Current goals can be found in the Care Plan section) Acute Rehab PT Goals Patient Stated Goal: none stated PT Goal Formulation: Patient unable to participate in goal setting Time For Goal Achievement:  07/04/14 Potential to Achieve Goals: Fair    Frequency Min 2X/week   Barriers to discharge        Co-evaluation               End of Session   Activity Tolerance:  (pt refusing OOB mobility) Patient left: in bed Nurse Communication:  (refusal of services)         Time: 1100-1131 PT Time Calculation (min) (ACUTE ONLY): 31 min   Charges:   PT Evaluation $Initial PT Evaluation Tier I: 1 Procedure PT Treatments $Therapeutic Activity: 8-22 mins   PT G CodesKingsley Callander 06/20/2014, 2:31 PM  Kittie Plater, PT, DPT Pager #: [phone] Office #: [phone]

## 2014-06-20 NOTE — Progress Notes (Signed)
Pt refusing all medications besides his Xanax.Gave Xanax per MD order. Pt educated on the importance of not taking his medications. Will continue to offer medications when they are due.

## 2014-06-20 NOTE — Progress Notes (Signed)
Patient refusing all medications at this time and earlier in the day. Patient educated on importance of medications and anti-virals. Patient with withdrawn/flat affect and refuses any type of medication. Patient did drink 1 Breeze nutritional supplement with assist. VSS, nursing will continue to monitor.

## 2014-06-20 NOTE — Progress Notes (Signed)
Pt found by nurse tech to be sitting on the floor beside bed. Patient said he felt like he was "choking/vomiting when he decided to try and sit on the side of the bed" and then proceeded to fall on the floor. Vital signs taken immediately after finding patient on the floor. Vital signs within normal limits. Patient denies hitting head or any new pain at this time. This RN, Secretary/administrator proceeded to use a gait belt and help pt back to bed. Pt cleaned and repositioned in bed with call bell within reach. MD notified and advised to implement fall precautions. Nursing will continue to monitor.

## 2014-06-20 NOTE — Progress Notes (Signed)
[facility] PROGRESS NOTE  Samuel Schultz UXN:[id number] DOB: Aug 18, 1975 DOA: 06/11/2014 PCP: [name], MD  Assessment/Plan:  Delirium -Patient with an episode of fall overnight, no head injury/LOC -Placed on Haldol 1mg  IV q 6hrs PRN- QTc on EKG WNL -Spoke with POA regarding pt unwillingness to take meds, be cathed (w/ urinary retention) -placed on fall precautions  Hyponatremia -Likely secondary to inc fluid intake, also with history of SIADH -Na 125- appears euvolemic -Placed on fluid restriction to only gatorade 1.5 L -Urine Na and Osm pending  SIRS/Sepssis secondary to osteomylitis  -Pt with episode of fever two nights ago.  Currently afebile -CXR- mild CHF, ?pneumonitis -Blood cxs/Cath tip- no growth to date  Lumabr discitis/ Septic arthritis of R hip -Pt with hospitalization 12/2013 for same issue, underwent aspiration, which was nonremarkable, and d/c'd with PICC line for chronic Abx. 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  MR R Hip- ?osteo of Iliac, improving yet persistent septic arthritis of right hip -ID on board, rec's- continue oral augmentin and Fluconazole to cover AMP sensitive enterococcus, and Condida that previously grew from one aspirate.  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  Anemia -Hgb 9.1 -Continue iron supplements -repeat CBC in am  Pancytopenia -Chronic -repeat CBC inam  Anxiety -stable -Continue Xanax PRN  Muscle spasms -Continue Robaxin PRN  DVT Prophylaxis SCDs Code Status: Full Family Communication: Spoke with friend, POA, regarding pt's refusal to take meds, and encouraged them to come and speak with him Disposition Plan: Inpatient   Consultants:  ID  Procedures:  None  Antibiotics:  Cefepine 1/4>>1/6  Vancomycin 1/4>>1/6  Fluconazone 1/5>>>  Augmentin 1/5>>  HPI/Subjective: Samuel Schultz is a 39 yo male with PMH of AIDS, episodes of Ampicillin sensistive  Enterococcal bacteremia, VRE, 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 He was also complaining of increasing back pain. He has a chronic indwelling PICC line.  In the ER, he was found to be hypoxic and febrile. MRI with evidence of worsening discitis.  Patient was then transferred to [facility] for further management.   Does not wish to speak- opens eyes, follows commands.  Refusing meds  Objective: Filed Vitals:   06/20/14 0651  BP: 114/71  Pulse: 100  Temp: 97.9 F (36.6 C)  Resp: 17    Intake/Output Summary (Last 24 hours) at 06/20/14 1047 Last data filed at 06/20/14 2542  Gross per 24 hour  Intake    240 ml  Output   1100 ml  Net   -860 ml   Filed Weights   06/11/14 2051  Weight: 75.3 kg (166 lb 0.1 oz)    Exam:  Gen: Alert Caucasian male, On [place] O2  HEENT: Dry mucus membranes, mild scleral icterus Chest: clear to auscultate bilaterally, no ronchi or rales  Cardiac: Regular rate and rhythm, S1-S2, no rubs murmurs or gallops  Abdomen: soft, non tender, non distended, +bowel sounds. No guarding or rigidity  Extremities:  Symmetrical in appearance without cyanosis or edema  Neurological: Unable to assess, unwilling to speak    Data Reviewed: Basic Metabolic Panel:  Recent Labs Lab 06/14/14 0328 06/16/14 0414 06/18/14 0606 06/19/14 0440 06/20/14 0520  NA 130* 132* 132* 132* 125*  K 3.7 3.7 3.8 3.8 3.1*  CL 105 105 105 104 99  CO2 22 22 26 23 23   GLUCOSE 78 74 79 94 99  BUN 7 5* 5* 5* 7  CREATININE 0.54 0.52 0.50 0.65 0.55  CALCIUM 7.7* 7.6* 7.6* 7.8* 7.8*   Liver Function Tests:  Recent Labs Lab 06/14/14 0328 06/16/14 0414 06/18/14 0606 06/19/14 0440  AST 50* 49* 46* 57*  ALT 22 20 20 22   ALKPHOS 105 106 102 98  BILITOT 2.3* 2.1* 1.9* 3.1*  PROT 5.6* 5.9* 5.6* 6.1  ALBUMIN 1.5* 1.6* 1.5* 1.5*   No results for input(s): LIPASE, AMYLASE in the last 168 hours.  Recent Labs Lab 06/14/14 0328  AMMONIA 30   CBC:  Recent Labs Lab 06/14/14 0328 06/14/14 1222 06/16/14 0414 06/18/14 0606 06/19/14 0440 06/20/14 0520  WBC 2.0*  --  2.0* 1.9* 2.4* 1.6*  HGB 6.9* 8.1* 8.0* 7.6* 8.6* 9.1*  HCT 20.8* 23.9* 24.8* 23.8* 26.2* 26.8*  MCV 100.5*  --  100.0 101.3* 104.0* 99.3  PLT 72*  --  73* 71* 62* 66*   Cardiac Enzymes: No results for input(s): CKTOTAL, CKMB, CKMBINDEX, TROPONINI in the last 168 hours. BNP (last 3 results) No results for input(s): PROBNP in the last 8760 hours. CBG:  Recent Labs Lab 06/13/14 1257  GLUCAP 121*    Recent Results (from the past 240 hour(s))  MRSA PCR Screening     Status: None   Collection Time: 06/11/14  8:57 PM  Result Value Ref Range Status   MRSA by PCR NEGATIVE NEGATIVE Final    Comment:        The GeneXpert MRSA Assay (FDA approved for NASAL specimens only), is one component of a comprehensive MRSA colonization surveillance program. It is not intended to diagnose MRSA infection nor to guide or monitor treatment for MRSA infections.   Culture, blood (routine x 2)     Status: None   Collection Time: 06/11/14 10:23 PM  Result  Value Ref Range Status   Specimen Description BLOOD LEFT HAND  Final   Special Requests BOTTLES DRAWN AEROBIC AND ANAEROBIC 5CC EACH  Final   Culture   Final    NO GROWTH 5 DAYS Performed at [facility]    Report Status 06/18/2014 FINAL  Final  Culture, blood (routine x 2)     Status: None   Collection Time: 06/11/14 10:31 PM  Result Value Ref Range Status   Specimen Description BLOOD LEFT WRIST  Final   Special Requests BOTTLES DRAWN AEROBIC AND ANAEROBIC 5CC EACH  Final   Culture   Final    NO GROWTH 5 DAYS Performed at [facility]    Report Status 06/18/2014 FINAL  Final  Cath Tip Culture     Status: None (Preliminary result)   Collection Time: 06/19/14 10:45 AM  Result Value Ref Range Status   Specimen Description CATH TIP PICC LINE  Final   Special Requests ARM  Final  Culture   Final    NO GROWTH 1 DAY Performed at [facility]    Report Status PENDING  Incomplete  Culture, blood (routine x 2)     Status: None (Preliminary result)   Collection Time: 06/19/14 10:55 AM  Result Value Ref Range Status   Specimen Description BLOOD LEFT HAND  Final   Special Requests BOTTLES DRAWN AEROBIC ONLY 10CC  Final   Culture   Final           BLOOD CULTURE RECEIVED NO GROWTH TO DATE CULTURE WILL BE HELD FOR 5 DAYS BEFORE ISSUING A FINAL NEGATIVE REPORT Performed at [facility]    Report Status PENDING  Incomplete  Culture, blood (routine x 2)     Status: None (Preliminary result)   Collection Time: 06/19/14 11:05 AM  Result Value Ref Range Status   Specimen Description BLOOD RIGHT HAND  Final   Special Requests   Final    BOTTLES DRAWN AEROBIC AND ANAEROBIC 10CC [place]  5CC RED   Culture   Final           BLOOD CULTURE RECEIVED NO GROWTH TO DATE CULTURE WILL BE HELD FOR 5 DAYS BEFORE ISSUING A FINAL NEGATIVE REPORT Performed at [facility]    Report Status PENDING  Incomplete  Clostridium Difficile by PCR     Status: None   Collection  Time: 06/19/14  8:56 PM  Result Value Ref Range Status   C difficile by pcr NEGATIVE NEGATIVE Final     Studies: Dg Chest 1 View  06/19/2014   CLINICAL DATA:  Fever.  EXAM: CHEST - 1 VIEW  COMPARISON:  06/11/2014.  FINDINGS: Right PICC line stable position. Cardiomegaly. Diffuse bilateral from interstitial prominence noted. These suggest congestive heart failure. Pneumonitis cannot be excluded. No pleural effusion or pneumothorax no acute bony abnormality.  IMPRESSION: 1. Right PICC line in stable position. 2. Cardiomegaly with mild bilateral pulmonary interstitial prominence suggesting mild congestive heart failure. Pneumonitis cannot be excluded.   Electronically Signed   By: Marcello Moores  Register   On: 06/19/2014 12:37    Scheduled Meds: . amoxicillin-clavulanate  1 tablet Oral Q12H  . antiseptic oral rinse  7 mL Mouth Rinse BID  . azithromycin  1,200 mg Oral Weekly  . buPROPion  300 mg Oral Daily  . Darunavir Ethanolate  800 mg Oral Q breakfast  . docusate sodium  100 mg Oral BID  . emtricitabine-tenofovir  1 tablet Oral QHS  . feeding supplement (RESOURCE BREEZE)  1 Container Oral TID BM  . ferrous sulfate  325 mg Oral BID WC  . fluconazole  400 mg Oral Daily  . lactose free nutrition  237 mL Oral BID BM  . lactulose  10 g Oral BID  . morphine  60 mg Oral Q12H  . pantoprazole  40 mg Oral Daily  . polyethylene glycol  17 g Oral Daily  . ritonavir  100 mg Oral Q breakfast  . senna-docusate  2 tablet Oral Daily   Continuous Infusions:   Principal Problem:   Acute encephalopathy Active Problems:   AIDS   Cirrhosis, non-alcoholic   Pancytopenia   Lumbar discitis   Thoracic discitis   Enterococcal bacteremia   Vancomycin resistant Enterococcus   Candida albicans infection   HIV disease   HCAP (healthcare-associated pneumonia)   Sepsis due to other etiology   Iliac crest osteosclerosis   Osteomyelitis   Abscess   FUO (fever of unknown origin)  Time spent:  Dig[place], [facility]  [facility] Pager [phone]. If 7PM-7AM, please contact night-coverage at [email or web address], password [facility] 06/20/2014, 10:47 AM  LOS: 9 days

## 2014-06-21 LAB — URINE CULTURE: Colony Count: 6000

## 2014-06-21 LAB — CBC
HCT: 26.1 % — ABNORMAL LOW (ref 39.0–52.0)
HEMOGLOBIN: 8.8 g/dL — AB (ref 13.0–17.0)
MCH: 33.3 pg (ref 26.0–34.0)
MCHC: 33.7 g/dL (ref 30.0–36.0)
MCV: 98.9 fL (ref 78.0–100.0)
Platelets: 58 10*3/uL — ABNORMAL LOW (ref 150–400)
RBC: 2.64 MIL/uL — ABNORMAL LOW (ref 4.22–5.81)
RDW: 19 % — AB (ref 11.5–15.5)
WBC: 1.5 10*3/uL — ABNORMAL LOW (ref 4.0–10.5)

## 2014-06-21 LAB — BASIC METABOLIC PANEL
ANION GAP: 7 (ref 5–15)
BUN: 10 mg/dL (ref 6–23)
CO2: 22 mmol/L (ref 19–32)
CREATININE: 0.53 mg/dL (ref 0.50–1.35)
Calcium: 8 mg/dL — ABNORMAL LOW (ref 8.4–10.5)
Chloride: 104 mEq/L (ref 96–112)
GFR calc Af Amer: >90 mL/min (ref >90)
GFR calc non Af Amer: >90 mL/min (ref >90)
Glucose, Bld: 94 mg/dL (ref 70–99)
POTASSIUM: 2.8 mmol/L — AB (ref 3.5–5.1)
Sodium: 133 mmol/L — ABNORMAL LOW (ref 135–145)

## 2014-06-21 LAB — URIC ACID, RANDOM URINE: URIC ACID, URINE: 78.5 mg/dL

## 2014-06-21 MED ORDER — MORPHINE SULFATE ER 60 MG PO TBCR: 60.0000 mg | EXTENDED_RELEASE_TABLET | Freq: Two times a day (BID) | ORAL | Status: AC

## 2014-06-21 MED ORDER — POTASSIUM CHLORIDE 10 MEQ/100ML IV SOLN
10.0000 meq | INTRAVENOUS | Status: AC
  Administered 2014-06-21: 10 meq via INTRAVENOUS
  Filled 2014-06-21 (×4): qty 100

## 2014-06-21 MED ORDER — POTASSIUM CHLORIDE CRYS ER 20 MEQ PO TBCR
40.0000 meq | EXTENDED_RELEASE_TABLET | ORAL | Status: DC
  Filled 2014-06-21: qty 2

## 2014-06-21 MED ORDER — POTASSIUM CHLORIDE CRYS ER 20 MEQ PO TBCR: 40.0000 meq | EXTENDED_RELEASE_TABLET | Freq: Three times a day (TID) | ORAL | Status: AC

## 2014-06-21 MED ORDER — AMOXICILLIN-POT CLAVULANATE 875-125 MG PO TABS: 1.0000 | ORAL_TABLET | Freq: Two times a day (BID) | ORAL | Status: AC

## 2014-06-21 MED ORDER — MORPHINE SULFATE 15 MG PO TABS: 15.0000 mg | ORAL_TABLET | ORAL | Status: AC | PRN

## 2014-06-21 MED ORDER — ALPRAZOLAM 0.25 MG PO TABS: 0.2500 mg | ORAL_TABLET | Freq: Two times a day (BID) | ORAL | Status: AC | PRN

## 2014-06-21 MED ORDER — CLONAZEPAM 1 MG PO TABS: 1.0000 mg | ORAL_TABLET | Freq: Every day | ORAL | Status: AC

## 2014-06-21 NOTE — Discharge Summary (Signed)
Patient leaving AMA    Samuel Schultz, is a 39 y.o. male  DOB [birth year]  MRN [id number].  Admission date:  06/11/2014  Admitting Physician  Rise Patience, MD  Discharge Date:  06/21/2014   Primary MD  [name], MD  Recommendations for primary care physician for things to follow:   Check CBC, CMP, 2 view CXR in 1 day, ID follow up in 1 week   Admission Diagnosis  PNEUMONIA HIV   Discharge Diagnosis  Diskitis, HIV     Principal Problem:   Acute encephalopathy Active Problems:   AIDS   Cirrhosis, non-alcoholic   Pancytopenia   Lumbar discitis   Thoracic discitis   Enterococcal bacteremia   Vancomycin resistant Enterococcus   Candida albicans infection   HIV disease   HCAP (healthcare-associated pneumonia)   Sepsis due to other etiology   Iliac crest osteosclerosis   Osteomyelitis   Abscess   FUO (fever of unknown origin)   Ascites   Failure to thrive (child)   Failure to thrive in adult   Depression      Past Medical History  Diagnosis Date  . HIV disease   . Cirrhosis   . PTSD (post-traumatic stress disorder)   . Depressive disorder   . Cancer   . Lymphoma   . Anemia     Past Surgical History  Procedure Laterality Date  . Cholecystectomy    . Appendectomy    . Tee without cardioversion N/A 11/13/2013    Procedure: TRANSESOPHAGEAL ECHOCARDIOGRAM (TEE);  Surgeon: Dorothy Spark, MD;  Location: [facility];  Service: Cardiovascular;  Laterality: N/A;  . Infection of spine    . Lumbar laminectomy/decompression microdiscectomy Left 04/16/2014    Procedure: Left Lumbar five-Sacral one laminectomy decompression and discectomy ;  Surgeon: Elaina Hoops, MD;  Location: [facility];  Service: Neurosurgery;  Laterality: Left;       History of present illness and  Hospital Course:     Kindly see  H&P for history of present illness and admission details, please review complete Labs, Consult reports and Test reports for all details in brief  HPI     39 y.o. M w/ Hx AIDS, episodes of AMPICILLIN sensitive enterococcal bacteremial, VRE, Candida Albicans isolated on aspirate from 01/31/14, cirrhosis of the liver, lymphoma, and pancytopenia who was taken to the ER at [facility] from rehabilitation after patient was found to be increasingly lethargic. Patient also was found to be hypoxic and mildly febrile in the ER. Chest x-ray did not show any definite infiltrates. Patient has chronic indwelling PICC line. Patient was complaining of increasing back pain and initial MRI suggested worsening discitis. Patient was transferred to [facility] for further management.   Hospital Course    Delirium -due to diskitis and Benzos - Narcotics, resolved after Benzos and Narcotics reduced. Now at baseline.    Lumabr discitis/ Septic arthritis of R hip -Pt with hospitalization 12/2013 for same issue, underwent aspiration, which was nonremarkable, and d/c'd with PICC line for  chronic Abx. -MR T/L spine- with evidence of chronic discitis in L3, L4, L5, with ventral epidural thickening, without evidence of abscess. MR R Hip- ?osteo of Iliac, improving yet persistent septic arthritis of right hip -ID on board, rec's- continue oral augmentin and Fluconazole to cover AMP sensitive enterococcus, and Condida that previously grew from one aspirate. Fluconazole will be continued indefinitely, Augmenting for 1-2 months, and serial exams/repeat imaging in clinic, will need close ID follow up, non complaint with Meds, now leaving AMA   Urinary retention -resolved   HIV  -continue current regimen of Prezista, Truvada -   Hypokalemic -refused suppliments, leaving AMA, 3 Pills of 40 meq PO Kdur ordered at SNF, recheck BMP in am.  Diarrhea -resolved. 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  Anemia -Hgb 9.1 -Continue iron supplements    Pancytopenia -Chronic -PCP to monitor  Anxiety -stable -Continue Xanax PRN  Hyponatremia - due to dehydration - improved after IVF   Discharge Condition: Guarded   Follow UP  Follow-up Information    Follow up with [name], MD. Schedule an appointment as soon as possible for a visit in 1 day.   Specialty:  Internal Medicine   Contact information:   [place] [address] [phone]       Follow up with Alcide Evener, MD. Schedule an appointment as soon as possible for a visit in 1 week.   Specialty:  Infectious Diseases   Contact information:   [address] [place] [address] [phone]         Discharge Instructions  and  Discharge Medications      Discharge Instructions    Diet - low sodium heart healthy    Complete by:  As directed      Discharge instructions    Complete by:  As directed   Follow with Primary MD [name], MD or SNF MD in 1 days   Get CBC, CMP, 2 view Chest X ray checked in 1 day   Activity: As tolerated with Full fall precautions use walker/cane & assistance as needed.   Disposition SNF   Diet: Heart Healthy   with feeding assistance and aspiration precautions as needed.  For Heart failure patients - Check your Weight same time everyday, if you gain over 2 pounds, or you develop in leg swelling, experience more shortness of breath or chest pain, call your Primary MD immediately. Follow Cardiac Low Salt Diet and 1.8 lit/day fluid restriction.   On your next visit with your primary care physician please Get Medicines reviewed and adjusted.   Please request your Prim.MD to go over  all Hospital Tests and Procedure/Radiological results at the follow up, please get all Hospital records sent to your Prim MD by signing hospital release before you go home.   If you experience worsening of your admission symptoms, develop shortness of breath, life threatening emergency, suicidal or homicidal thoughts you must seek medical attention immediately by calling 911 or calling your MD immediately  if symptoms less severe.  You Must read complete instructions/literature along with all the possible adverse reactions/side effects for all the Medicines you take and that have been prescribed to you. Take any new Medicines after you have completely understood and accpet all  the possible adverse reactions/side effects.   Do not drive, operating heavy machinery, perform activities at heights, swimming or participation in water activities or provide baby sitting services if your were admitted for syncope or siezures until you have seen by Primary MD or a Neurologist and advised to do so again.  Do not drive when taking Pain medications.    Do not take more than prescribed Pain, Sleep and Anxiety Medications  Special Instructions: If you have smoked or chewed Tobacco  in the last 2 yrs please stop smoking, stop any regular Alcohol  and or any Recreational drug use.  Wear Seat belts while driving.   Please note  You were cared for by a hospitalist during your hospital stay. If you have any questions about your discharge medications or the care you received while you were in the hospital after you are discharged, you can call the unit and asked to speak with the hospitalist on call if the hospitalist that took care of you is not available. Once you are discharged, your primary care physician will handle any further medical issues. Please note that NO REFILLS for any discharge medications will be authorized once you are discharged, as it is imperative that you return to your primary care physician  (or establish a relationship with a primary care physician if you do not have one) for your aftercare needs so that they can reassess your need for medications and monitor your lab values.            Medication List    STOP taking these medications        VANCOMYCIN HCL IN DEXTROSE IV      TAKE these medications        acetaminophen 325 MG tablet  Commonly known as:  TYLENOL  Take 2 tablets (650 mg total) by mouth every 4 (four) hours as needed (temp > 100.5).     ALPRAZolam 0.25 MG tablet  Commonly known as:  XANAX  Take 1 tablet (0.25 mg total) by mouth 2 (two) times daily as needed for anxiety.     amoxicillin-clavulanate 875-125 MG per tablet  Commonly known as:  AUGMENTIN  Take 1 tablet by mouth every 12 (twelve) hours.     antiseptic oral rinse Liqd  15 mLs by Mouth Rinse route as needed for dry mouth.     atovaquone 750 MG/5ML suspension  Commonly known as:  MEPRON  Take 750 mg by mouth daily.     azithromycin 600 MG tablet  Commonly known as:  ZITHROMAX  Take 1,200 mg by mouth once a week. saturday     buPROPion 300 MG 24 hr tablet  Commonly known as:  WELLBUTRIN XL  Take 300 mg by mouth daily.     clonazePAM 1 MG tablet  Commonly known as:  KLONOPIN  Take 1 tablet (1 mg total) by mouth at bedtime.     Darunavir Ethanolate 800 MG tablet  Commonly known as:  PREZISTA  Take 1 tablet (800 mg total) by mouth daily.     DSS 100 MG Caps  Take 100 mg by mouth 2 (two) times daily.     emtricitabine-tenofovir 200-300 MG per tablet  Commonly known as:  TRUVADA  Take 1 tablet by mouth at bedtime.     feeding supplement (ENSURE COMPLETE) Liqd  Take 237 mLs by mouth 2 (two) times daily between meals.     ferrous sulfate 325 (65 FE) MG tablet  Take 1 tablet (325 mg  total) by mouth 2 (two) times daily with a meal.     fluconazole 200 MG tablet  Commonly known as:  DIFLUCAN  Take 2 tablets (400 mg total) by mouth daily.     gabapentin 400 MG capsule    Commonly known as:  NEURONTIN  Take 400 mg by mouth 3 (three) times daily.     menthol-cetylpyridinium 3 MG lozenge  Commonly known as:  CEPACOL  Take 1 lozenge (3 mg total) by mouth as needed for sore throat (sore throat).     methocarbamol 500 MG tablet  Commonly known as:  ROBAXIN  Take 1 tablet (500 mg total) by mouth every 6 (six) hours as needed for muscle spasms.     morphine 60 MG 12 hr tablet  Commonly known as:  MS CONTIN  Take 1 tablet (60 mg total) by mouth every 12 (twelve) hours.     morphine 15 MG tablet  Commonly known as:  MSIR  Take 1 tablet (15 mg total) by mouth every 4 (four) hours as needed for severe pain.     omeprazole 20 MG capsule  Commonly known as:  PRILOSEC  Take 20 mg by mouth 2 (two) times daily.     ondansetron 4 MG tablet  Commonly known as:  ZOFRAN  Take 4 mg by mouth every 6 (six) hours as needed for nausea.     ondansetron 4 MG tablet  Commonly known as:  ZOFRAN  Take 4 mg by mouth every 8 (eight) hours.     polyethylene glycol packet  Commonly known as:  MIRALAX / GLYCOLAX  Take 17 g by mouth daily.     potassium chloride SA 20 MEQ tablet  Commonly known as:  K-DUR,KLOR-CON  Take 2 tablets (40 mEq total) by mouth 3 (three) times daily. 3 times only, then check BMP in am     promethazine 25 MG tablet  Commonly known as:  PHENERGAN  Take 12.5 mg by mouth every 6 (six) hours as needed for nausea or vomiting.     ritonavir 100 MG capsule  Commonly known as:  NORVIR  Take 1 capsule (100 mg total) by mouth daily with breakfast. With Prezista     senna-docusate 8.6-50 MG per tablet  Commonly known as:  Senokot-S  Take 2 tablets by mouth daily.     sucralfate 1 G tablet  Commonly known as:  CARAFATE  Take 1 tablet (1 g total) by mouth 3 (three) times daily with meals as needed (vomiting,).          Diet and Activity recommendation: See Discharge Instructions above   Consults obtained - ID   Major procedures and  Radiology Reports - PLEASE review detailed and final reports for all details, in brief -       Dg Chest 1 View  06/19/2014   CLINICAL DATA:  Fever.  EXAM: CHEST - 1 VIEW  COMPARISON:  06/11/2014.  FINDINGS: Right PICC line stable position. Cardiomegaly. Diffuse bilateral from interstitial prominence noted. These suggest congestive heart failure. Pneumonitis cannot be excluded. No pleural effusion or pneumothorax no acute bony abnormality.  IMPRESSION: 1. Right PICC line in stable position. 2. Cardiomegaly with mild bilateral pulmonary interstitial prominence suggesting mild congestive heart failure. Pneumonitis cannot be excluded.   Electronically Signed   By: Marcello Moores  Register   On: 06/19/2014 12:37   Mr Thoracic Spine W Wo Contrast  06/13/2014   CLINICAL DATA:  Immunocompromised patient, status post spine surgery November 2015 with  worsening discitis osteomyelitis (T11-12, L3-4, L4-5 and S1-2). History of cirrhosis, lymphoma.  EXAM: MRI THORACIC AND LUMBAR SPINE WITHOUT AND WITH CONTRAST  TECHNIQUE: Multiplanar and multiecho pulse sequences of the thoracic and lumbar spine were obtained without and with intravenous contrast.  CONTRAST:  19mL MULTIHANCE GADOBENATE DIMEGLUMINE 529 MG/ML IV SOLN  COMPARISON:  MRI of the thoracic and lumbar spine June 11, 2014  FINDINGS: MR THORACIC SPINE FINDINGS  Motion degraded examination.  Stable moderate wedging of vertebral body T11, mild wedging of vertebral body T12 associated with mildly expansile T2 bright enhancing signal within the T11-12 disc. Linear low T1, bright T2 mild linear enhancement within vertebral body T8 nonspecific though, there is an acute appearing associated Schmorl's node. Mild chronic wedging of vertebral body T4 as previously noted. Generalized decreased bone marrow signal which can be seen with patients myeloproliferative disorder.  Thoracic spinal cord appears normal morphology and signal characteristics, however there is ventral cord  deformity at T11-12 due to small amount of retropulsed bony fragments. No abnormal cord, leptomeningeal nor epidural enhancement.  Small bilateral pleural effusions. Suspected splenomegaly incompletely imaged. Tiny LEFT central T7-8 disc protrusion. Small RIGHT central T8-9 and T9-10 disc protrusions. Small LEFT central T10-11 disc protrusion. Mild canal stenosis at T11-12 due to retropulsed bony fragments.  MR LUMBAR SPINE FINDINGS  Moderately motion degraded examination. Continued counting from the thoracic spine demonstrates transitional anatomy, lumbarized S1 vertebral body as previously noted. Moderate to severe  Moderate L3-4, moderate to severe L4-5 disc height loss, associated enhancing endplate Schmorl's nodes. Bright STIR signal without superimposed intradiscal enhancement, however there is patchy enhancement within the L3, L4 and L5 vertebra. Mild to moderate L5-S1 disc height loss, enhancing endplate changes and enhancing L5-S1 disc, status post LEFT L5-S1 hemilaminectomy. Enhancement extends through the surgical defect without focal fluid collection. Abnormal T2 bright signal and enhancement within the RIGHT iliac bone, associated with T2 bright signal within the included RIGHT gluteal muscles. Generalized decreased T1 bone marrow signal which likely represents patient's myeloproliferative disorder.  No malalignment. Conus medullaris terminates at L1-1 2. No convincing evidence of cord, leptomeningeal abnormal enhancement. Enhancing granulation tissue within the LEFT S1-2 lateral recess without focal fluid collection. Assessment is somewhat limited by moderately motion degraded axial sequences. Suspected splenomegaly.  L1-2 and L2-3: No disc bulge, canal stenosis nor neural foraminal narrowing.  L3-4: Small broad-based disc bulge, no canal stenosis or neural foraminal narrowing.  L4-5: Small broad-based disc bulge, no canal stenosis. Mild LEFT neural foraminal narrowing.  L5-S1: No disc bulge. Mild  facet arthropathy and ligamentum flavum redundancy. Very mild bilateral neural foraminal narrowing.  L5-S1: Status post LEFT hemilaminectomy. No osseous canal stenosis. Partial effacement of LEFT lateral recess may affect the traversing LEFT S2 nerve. Very mild RIGHT neural foraminal narrowing.  IMPRESSION: MRI THORACIC SPINE: Motion degraded examination. 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  Ill-defined enhancing signal within T8, associated inferior endplate Schmorl's though, this may reflect stress response.  MRI LUMBAR SPINE: Motion degraded examination. Transitional anatomy, lumbarized S1 vertebral body. Status post LEFT S1-2 hemilaminectomy, chronic appearing osteomyelitis. No epidural abscess.  Enhancement within L3, L4 and L5 vertebral bodies, without suspicious intradiscal enhancement may reflect chronic osteomyelitis/reactive changes.  Ill-defined signal in RIGHT iliac bone concerning for osteomyelitis with subjacent RIGHT gluteal myositis.   Electronically Signed   By: Elon Alas   On: 06/13/2014 01:01   Mr Lumbar Spine W Wo Contrast  06/13/2014   CLINICAL DATA:  Immunocompromised patient,  status post spine surgery November 2015 with worsening discitis osteomyelitis (T11-12, L3-4, L4-5 and S1-2). History of cirrhosis, lymphoma.  EXAM: MRI THORACIC AND LUMBAR SPINE WITHOUT AND WITH CONTRAST  TECHNIQUE: Multiplanar and multiecho pulse sequences of the thoracic and lumbar spine were obtained without and with intravenous contrast.  CONTRAST:  5mL MULTIHANCE GADOBENATE DIMEGLUMINE 529 MG/ML IV SOLN  COMPARISON:  MRI of the thoracic and lumbar spine June 11, 2014  FINDINGS: MR THORACIC SPINE FINDINGS  Motion degraded examination.  Stable moderate wedging of vertebral body T11, mild wedging of vertebral body T12 associated with mildly expansile T2 bright enhancing signal within the T11-12 disc. Linear low T1, bright T2 mild linear enhancement  within vertebral body T8 nonspecific though, there is an acute appearing associated Schmorl's node. Mild chronic wedging of vertebral body T4 as previously noted. Generalized decreased bone marrow signal which can be seen with patients myeloproliferative disorder.  Thoracic spinal cord appears normal morphology and signal characteristics, however there is ventral cord deformity at T11-12 due to small amount of retropulsed bony fragments. No abnormal cord, leptomeningeal nor epidural enhancement.  Small bilateral pleural effusions. Suspected splenomegaly incompletely imaged. Tiny LEFT central T7-8 disc protrusion. Small RIGHT central T8-9 and T9-10 disc protrusions. Small LEFT central T10-11 disc protrusion. Mild canal stenosis at T11-12 due to retropulsed bony fragments.  MR LUMBAR SPINE FINDINGS  Moderately motion degraded examination. Continued counting from the thoracic spine demonstrates transitional anatomy, lumbarized S1 vertebral body as previously noted. Moderate to severe  Moderate L3-4, moderate to severe L4-5 disc height loss, associated enhancing endplate Schmorl's nodes. Bright STIR signal without superimposed intradiscal enhancement, however there is patchy enhancement within the L3, L4 and L5 vertebra. Mild to moderate L5-S1 disc height loss, enhancing endplate changes and enhancing L5-S1 disc, status post LEFT L5-S1 hemilaminectomy. Enhancement extends through the surgical defect without focal fluid collection. Abnormal T2 bright signal and enhancement within the RIGHT iliac bone, associated with T2 bright signal within the included RIGHT gluteal muscles. Generalized decreased T1 bone marrow signal which likely represents patient's myeloproliferative disorder.  No malalignment. Conus medullaris terminates at L1-1 2. No convincing evidence of cord, leptomeningeal abnormal enhancement. Enhancing granulation tissue within the LEFT S1-2 lateral recess without focal fluid collection. Assessment is  somewhat limited by moderately motion degraded axial sequences. Suspected splenomegaly.  L1-2 and L2-3: No disc bulge, canal stenosis nor neural foraminal narrowing.  L3-4: Small broad-based disc bulge, no canal stenosis or neural foraminal narrowing.  L4-5: Small broad-based disc bulge, no canal stenosis. Mild LEFT neural foraminal narrowing.  L5-S1: No disc bulge. Mild facet arthropathy and ligamentum flavum redundancy. Very mild bilateral neural foraminal narrowing.  L5-S1: Status post LEFT hemilaminectomy. No osseous canal stenosis. Partial effacement of LEFT lateral recess may affect the traversing LEFT S2 nerve. Very mild RIGHT neural foraminal narrowing.  IMPRESSION: MRI THORACIC SPINE: Motion degraded examination. 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  Ill-defined enhancing signal within T8, associated inferior endplate Schmorl's though, this may reflect stress response.  MRI LUMBAR SPINE: Motion degraded examination. Transitional anatomy, lumbarized S1 vertebral body. Status post LEFT S1-2 hemilaminectomy, chronic appearing osteomyelitis. No epidural abscess.  Enhancement within L3, L4 and L5 vertebral bodies, without suspicious intradiscal enhancement may reflect chronic osteomyelitis/reactive changes.  Ill-defined signal in RIGHT iliac bone concerning for osteomyelitis with subjacent RIGHT gluteal myositis.   Electronically Signed   By: Elon Alas   On: 06/13/2014 01:01   Mr Pelvis W Wo Contrast  06/17/2014  CLINICAL DATA:  Right hip and pelvic pain.  EXAM: MRI PELVIS WITHOUT AND WITH CONTRAST  TECHNIQUE: Multiplanar multisequence MR imaging of the pelvis was performed both before and after administration of intravenous contrast.  CONTRAST:  86mL MULTIHANCE GADOBENATE DIMEGLUMINE 529 MG/ML IV SOLN  COMPARISON:  MRI L-spine 06/12/2014, MRI L-spine 06/11/2014, MRI right hip 04/19/2014  FINDINGS: Bone  Marrow:  There is interval resolution of  edema in the right femoral head. There is near complete resolution of edema in the right acetabulum.  The previously reported abnormality on recent MRI of the lumbar spine in the right ilium corresponds with fat signal consistent with a noninfectious etiology.  Again noted are benign-appearing lesions in knee left supra-acetabular ilium extending to knee posterior column most consistent with a benign chondroid lesion. There is a similar smaller lesion in the right iliac bone, also likely representing a benign chondroid lesion.  No fracture, dislocation or avascular necrosis.  There is transitional anatomy with lumbarization of the S1 vertebral body. There is T2 hyperintense signal within the L5-S1 disc space with enhancement on postcontrast imaging and enhancement of the adjacent S1 and S2 vertebral bodies most concerning for discitis.  Alignment  Normal. No subluxation.  Dysplasia  None.  Joint effusion  Small right hip joint effusion with enhancement most consistent with synovitis. No left hip joint effusion.  Labrum  No gross labral or paralabral abnormality.  Cartilage  Femoral cartilage: Right hip superior cartilage loss.  Acetabular cartilage: Right hip acetabular cartilage loss.  Capsule and ligaments  Normal.  Muscles and Tendons  There is a edema within the subcutaneous fat around the pelvis. There is mild muscle edema bilaterally within the gluteal musculature. There is mild muscle edema and enhancement of the right obturator internus muscle. This may reflect reactive edema versus muscle strain versus infectious myositis.  The hamstring origins are normal.  The iliopsoas tendons are normal.  Other Findings  There is no fluid collection or hematoma.  There is no abscess.  Viscera  Normal. No abnormality seen in pelvis. No lymphadenopathy. No free fluid in the pelvis.  IMPRESSION: 1. Right hip arthritis with joint space narrowing and persistent synovitis most concerning for persistent septic arthritis.  Inflammatory arthritis is also in the differential diagnosis but considered less likely. There is significant improvement in the right femoral head edema compared with 04/19/2014. There is mild edema and enhancement of the right acetabulum which is also improved. 2. Discitis/osteomyelitis versus reactive changes at S1-2.   Electronically Signed   By: Kathreen Devoid   On: 06/17/2014 13:36    Micro Results      Recent Results (from the past 240 hour(s))  MRSA PCR Screening     Status: None   Collection Time: 06/11/14  8:57 PM  Result Value Ref Range Status   MRSA by PCR NEGATIVE NEGATIVE Final    Comment:        The GeneXpert MRSA Assay (FDA approved for NASAL specimens only), is one component of a comprehensive MRSA colonization surveillance program. It is not intended to diagnose MRSA infection nor to guide or monitor treatment for MRSA infections.   Culture, blood (routine x 2)     Status: None   Collection Time: 06/11/14 10:23 PM  Result Value Ref Range Status   Specimen Description BLOOD LEFT HAND  Final   Special Requests BOTTLES DRAWN AEROBIC AND ANAEROBIC 5CC EACH  Final   Culture   Final    NO GROWTH 5 DAYS Performed  at [facility]    Report Status 06/18/2014 FINAL  Final  Culture, blood (routine x 2)     Status: None   Collection Time: 06/11/14 10:31 PM  Result Value Ref Range Status   Specimen Description BLOOD LEFT WRIST  Final   Special Requests BOTTLES DRAWN AEROBIC AND ANAEROBIC 5CC EACH  Final   Culture   Final    NO GROWTH 5 DAYS Performed at [facility]    Report Status 06/18/2014 FINAL  Final  Cath Tip Culture     Status: None (Preliminary result)   Collection Time: 06/19/14 10:45 AM  Result Value Ref Range Status   Specimen Description CATH TIP PICC LINE  Final   Special Requests ARM  Final   Culture   Final    NO GROWTH 2 DAYS Performed at [facility]    Report Status PENDING  Incomplete  Culture, blood (routine x 2)      Status: None (Preliminary result)   Collection Time: 06/19/14 10:55 AM  Result Value Ref Range Status   Specimen Description BLOOD LEFT HAND  Final   Special Requests BOTTLES DRAWN AEROBIC ONLY 10CC  Final   Culture   Final           BLOOD CULTURE RECEIVED NO GROWTH TO DATE CULTURE WILL BE HELD FOR 5 DAYS BEFORE ISSUING A FINAL NEGATIVE REPORT Performed at [facility]    Report Status PENDING  Incomplete  Culture, blood (routine x 2)     Status: None (Preliminary result)   Collection Time: 06/19/14 11:05 AM  Result Value Ref Range Status   Specimen Description BLOOD RIGHT HAND  Final   Special Requests   Final    BOTTLES DRAWN AEROBIC AND ANAEROBIC 10CC [place]  5CC RED   Culture   Final           BLOOD CULTURE RECEIVED NO GROWTH TO DATE CULTURE WILL BE HELD FOR 5 DAYS BEFORE ISSUING A FINAL NEGATIVE REPORT Performed at [facility]    Report Status PENDING  Incomplete  Clostridium Difficile by PCR     Status: None   Collection Time: 06/19/14  8:56 PM  Result Value Ref Range Status   C difficile by pcr NEGATIVE NEGATIVE Final       Today   Subjective:   Samuel Schultz today has no headache,no chest abdominal pain,no new weakness tingling or numbness, wants to signout AMA  Objective:   Blood pressure 110/63, pulse 85, temperature 98.4 F (36.9 C), temperature source Oral, resp. rate 14, height 5\' 9"  (1.753 m), weight 75.3 kg (166 lb 0.1 oz), SpO2 96 %.   Intake/Output Summary (Last 24 hours) at 06/21/14 0753 Last data filed at 06/20/14 2043  Gross per 24 hour  Intake    120 ml  Output    400 ml  Net   -280 ml    Exam Awake Alert, Oriented x 3, No new F.N deficits, Normal affect [place].AT,PERRAL Supple Neck,No JVD, No cervical lymphadenopathy appriciated.  Symmetrical Chest wall movement, Good air movement bilaterally, CTAB RRR,No Gallops,Rubs or new Murmurs, No Parasternal Heave +ve B.Sounds, Abd Soft, Non tender, No organomegaly appriciated, No rebound  -guarding or rigidity. No Cyanosis, Clubbing or edema, No new Rash or bruise  Data Review   CBC w Diff: Lab Results  Component Value Date   WBC 1.5* 06/21/2014   HGB 8.8* 06/21/2014   HCT 26.1* 06/21/2014   PLT 58* 06/21/2014   LYMPHOPCT  12 06/11/2014   BANDSPCT 0 01/01/2014   MONOPCT 11 06/11/2014   EOSPCT 1 06/11/2014   BASOPCT 0 06/11/2014    CMP: Lab Results  Component Value Date   NA 133* 06/21/2014   K 2.8* 06/21/2014   CL 104 06/21/2014   CO2 22 06/21/2014   BUN 10 06/21/2014   CREATININE 0.53 06/21/2014   PROT 6.1 06/19/2014   ALBUMIN 1.5* 06/19/2014   BILITOT 3.1* 06/19/2014   ALKPHOS 98 06/19/2014   AST 57* 06/19/2014   ALT 22 06/19/2014  .   Total Time in preparing paper work, data evaluation and todays exam - 35 minutes  Thurnell Lose M.D on 06/21/2014 at 7:53 AM  [facility] Office  [phone]

## 2014-06-21 NOTE — Progress Notes (Signed)
Patient requests patient advocate - contacted. Room search - no large,black headphones in room . Security came and made report. Called step-down [facility] where transferred from last week - searched and not found . Called [facility] in [place] -searched his room - headphones not there. Continue to monitor. Patient refuses po Potassium. Taking IV.

## 2014-06-21 NOTE — Clinical Social Work Note (Signed)
Patient to discharge to (return) [facility] today (per MD order). RN to call report prior to transportation to: [phone] Room 223 Transportation: [facility]  Patient is anxious to leave the hospital in search of belongings.  Patient advocate called/involved.  DC summary sent to SNF for review.  Packet complete.    Nonnie Done, [place] [phone]  Psychiatric & Orthopedics (5N 1-16) Clinical Social Worker

## 2014-06-21 NOTE — Progress Notes (Signed)
Patient was cooperative during night shift, took his meds and was appreciative of care.  He requested Imodium this morning after 7 am, I went in to administer med and patient started complaining that someone took his large, black headphones.  Both RN and CNA only saw a small black and yellow headphone but not a large headphone he described.  Charge nurse and other staff helped searched room only yellow, black, small headphones in room.  Patient is argumentative and accusing that his item was taken by staff.  Security contacted.

## 2014-06-21 NOTE — Progress Notes (Signed)
Pt states that he does not want any potassium supplements or IV potassium. IV potassium was burning the pt arm and told me to stop the infusion and that he did not want any more runs of potassium. Pt received about 47ml of the 185ml potassium infusion. Pt was educated on the dangers of having low potassium and infusion stopped. Pt was aggitated and stated he was ready to go back to [place].

## 2014-06-22 LAB — CATH TIP CULTURE: CULTURE: NO GROWTH

## 2014-06-23 LAB — POTASSIUM: Potassium: 3 mmol/L — ABNORMAL LOW (ref 3.5–5.1)

## 2014-06-25 ENCOUNTER — Ambulatory Visit: Payer: Medicaid Other | Admitting: Infectious Disease

## 2014-06-25 LAB — COMPREHENSIVE METABOLIC PANEL
ALBUMIN: 1.6 g/dL — AB (ref 3.4–5.0)
ALK PHOS: 114 U/L
ALT: 42 U/L
Anion Gap: 6 — ABNORMAL LOW (ref 7–16)
BILIRUBIN TOTAL: 1.8 mg/dL — AB (ref 0.2–1.0)
BUN: 6 mg/dL — ABNORMAL LOW (ref 7–18)
Calcium, Total: 7.2 mg/dL — ABNORMAL LOW (ref 8.5–10.1)
Chloride: 103 mmol/L (ref 98–107)
Co2: 24 mmol/L (ref 21–32)
Creatinine: 0.75 mg/dL (ref 0.60–1.30)
EGFR (African American): >60
EGFR (Non-African Amer.): >60
Glucose: 120 mg/dL — ABNORMAL HIGH (ref 65–99)
OSMOLALITY: 265 (ref 275–301)
Potassium: 4.2 mmol/L (ref 3.5–5.1)
SGOT(AST): 69 U/L — ABNORMAL HIGH (ref 15–37)
SODIUM: 133 mmol/L — AB (ref 136–145)
TOTAL PROTEIN: 5.8 g/dL — AB (ref 6.4–8.2)

## 2014-06-25 LAB — CBC WITH DIFFERENTIAL/PLATELET
BASOS PCT: 0.7 %
Basophil #: 0 10*3/uL (ref 0.0–0.1)
EOS PCT: 1.8 %
Eosinophil #: 0.1 10*3/uL (ref 0.0–0.7)
HCT: 28.1 % — ABNORMAL LOW (ref 40.0–52.0)
HGB: 8.9 g/dL — ABNORMAL LOW (ref 13.0–18.0)
LYMPHS ABS: 1 10*3/uL (ref 1.0–3.6)
Lymphocyte %: 26.9 %
MCH: 34.4 pg — AB (ref 26.0–34.0)
MCHC: 31.7 g/dL — ABNORMAL LOW (ref 32.0–36.0)
MCV: 108 fL — ABNORMAL HIGH (ref 80–100)
MONO ABS: 0.3 x10 3/mm (ref 0.2–1.0)
Monocyte %: 8.1 %
Neutrophil #: 2.2 10*3/uL (ref 1.4–6.5)
Neutrophil %: 62.5 %
Platelet: 62 10*3/uL — ABNORMAL LOW (ref 150–440)
RBC: 2.59 10*6/uL — ABNORMAL LOW (ref 4.40–5.90)
RDW: 21.1 % — ABNORMAL HIGH (ref 11.5–14.5)
WBC: 3.6 10*3/uL — ABNORMAL LOW (ref 3.8–10.6)

## 2014-06-25 LAB — CULTURE, BLOOD (ROUTINE X 2)
CULTURE: NO GROWTH
Culture: NO GROWTH

## 2014-07-09 ENCOUNTER — Encounter: Payer: Self-pay | Admitting: Internal Medicine

## 2014-07-25 ENCOUNTER — Other Ambulatory Visit: Payer: Medicaid Other

## 2014-08-06 ENCOUNTER — Telehealth: Payer: Self-pay | Admitting: Infectious Diseases

## 2014-08-06 NOTE — Telephone Encounter (Signed)
error 

## 2014-08-07 ENCOUNTER — Encounter
Admit: 2014-08-07 | Disposition: A | Discharge status: Home / Self Care | Payer: Self-pay | Attending: Internal Medicine | Admitting: Internal Medicine

## 2014-08-08 ENCOUNTER — Ambulatory Visit: Payer: Medicaid Other | Admitting: Infectious Disease

## 2014-08-14 ENCOUNTER — Other Ambulatory Visit: Payer: Medicaid Other

## 2014-08-16 ENCOUNTER — Other Ambulatory Visit: Payer: Self-pay | Admitting: *Deleted

## 2014-08-16 DIAGNOSIS — Z113 Encounter for screening for infections with a predominantly sexual mode of transmission: Secondary | ICD-10-CM

## 2014-09-07 ENCOUNTER — Encounter
Admit: 2014-09-07 | Disposition: A | Discharge status: Home / Self Care | Payer: Self-pay | Attending: Internal Medicine | Admitting: Internal Medicine

## 2014-09-19 IMAGING — XA IR FLUORO GUIDE NDL PLMT / BX
1 series · 10 of 10 positions shown · IV contrast (IODINE)
Comparison: none

CLINICAL DATA: Severe low back pain secondary to discitis at L3-4 .

[Series 300: ir cervical/thoracic disc aspiration w/i · 10 of 10 slices shown]
[im 1/10]
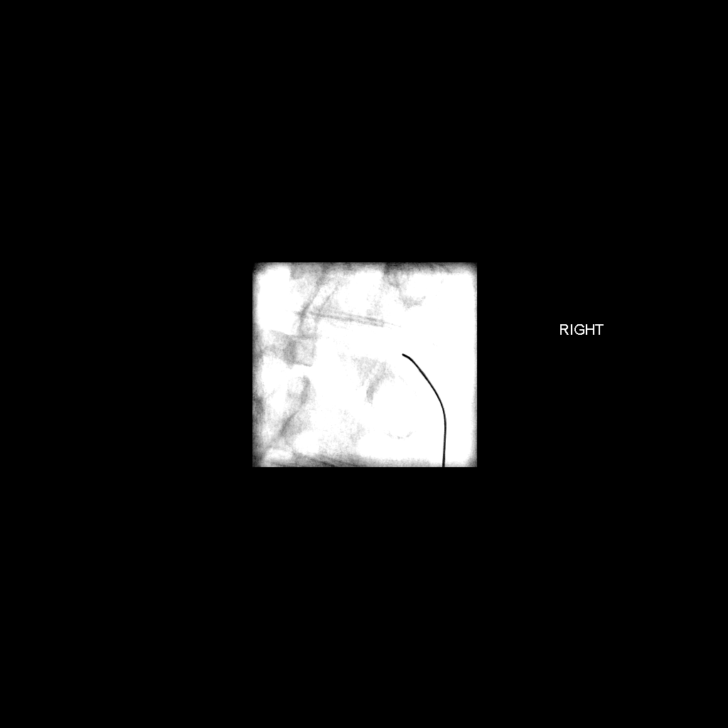
[im 2/10]
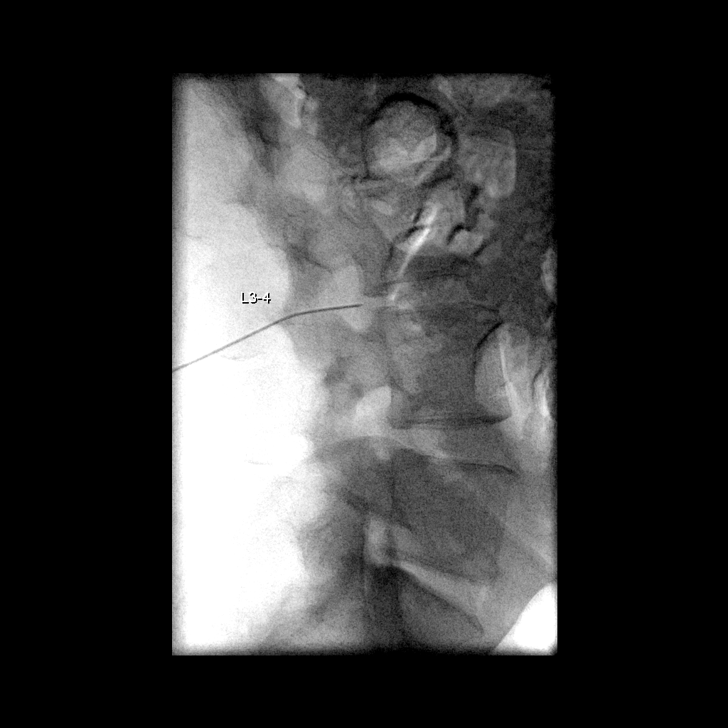
[im 3/10  full-range]
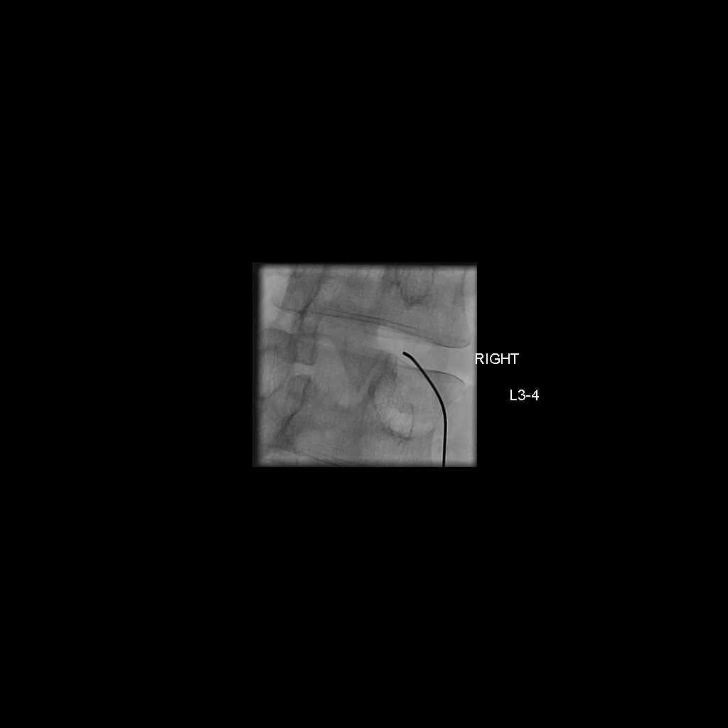
[im 4/10]
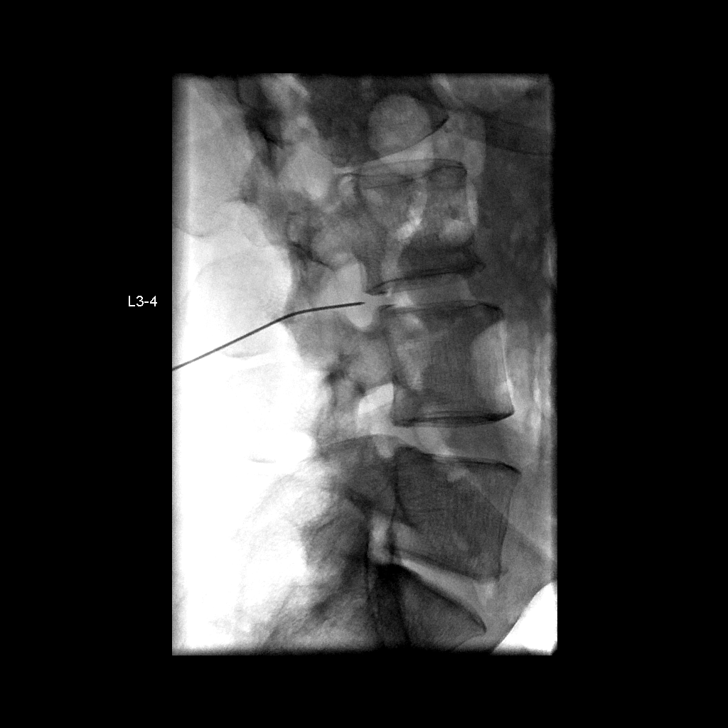
[im 5/10  full-range]
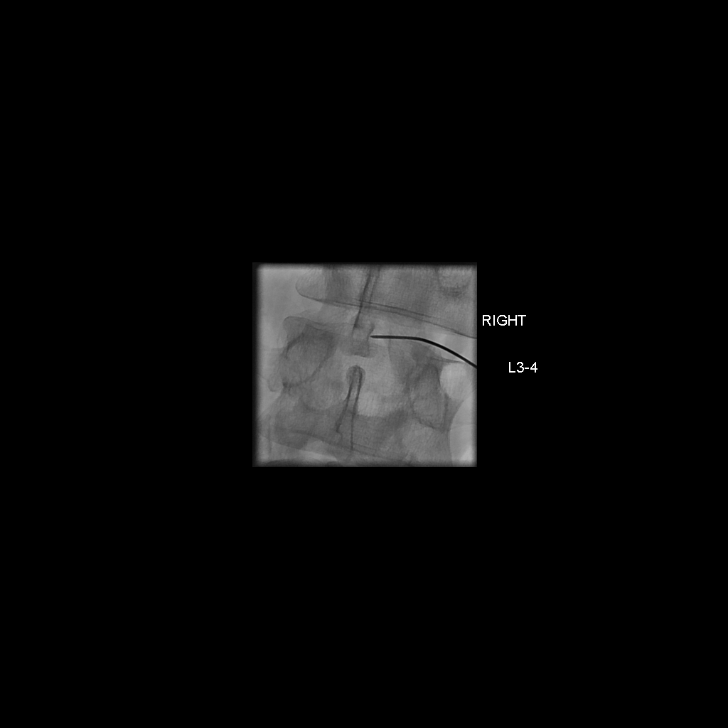
[im 6/10]
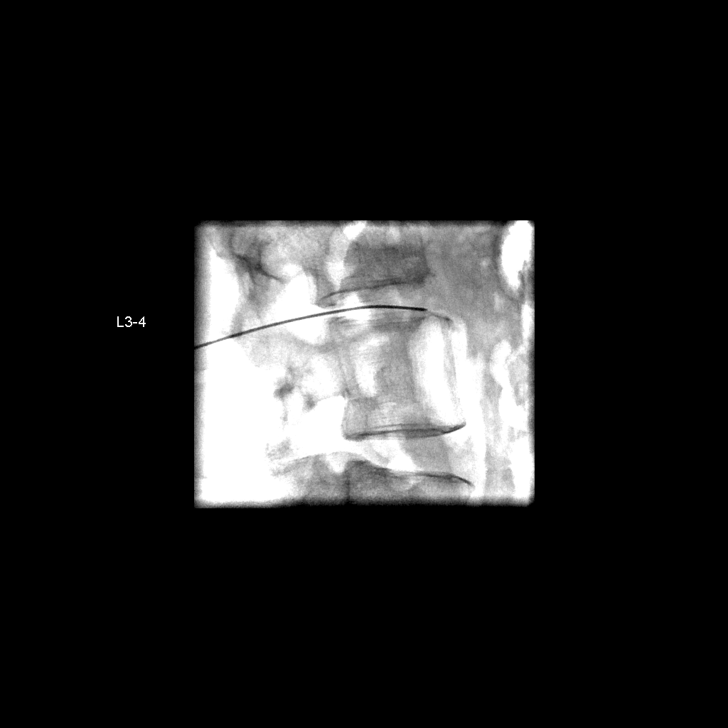
[im 7/10]
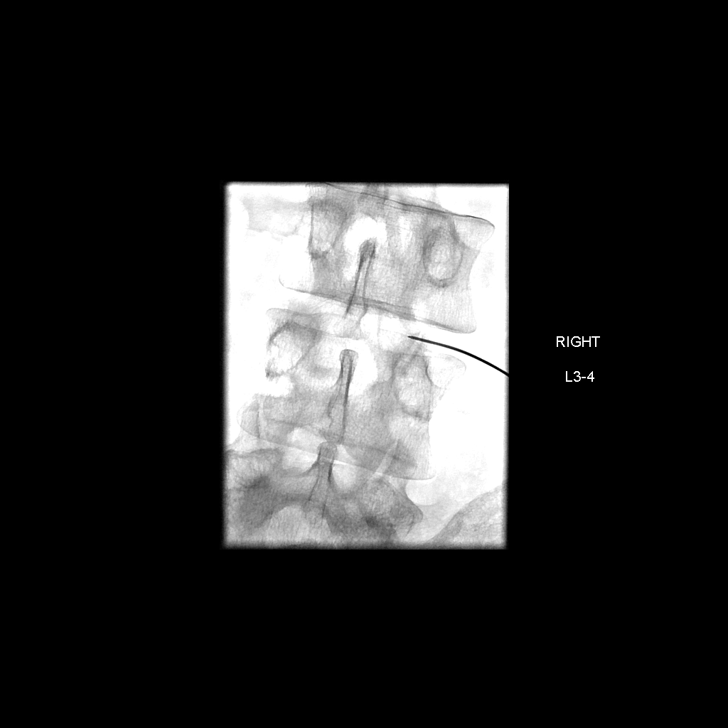
[im 8/10]
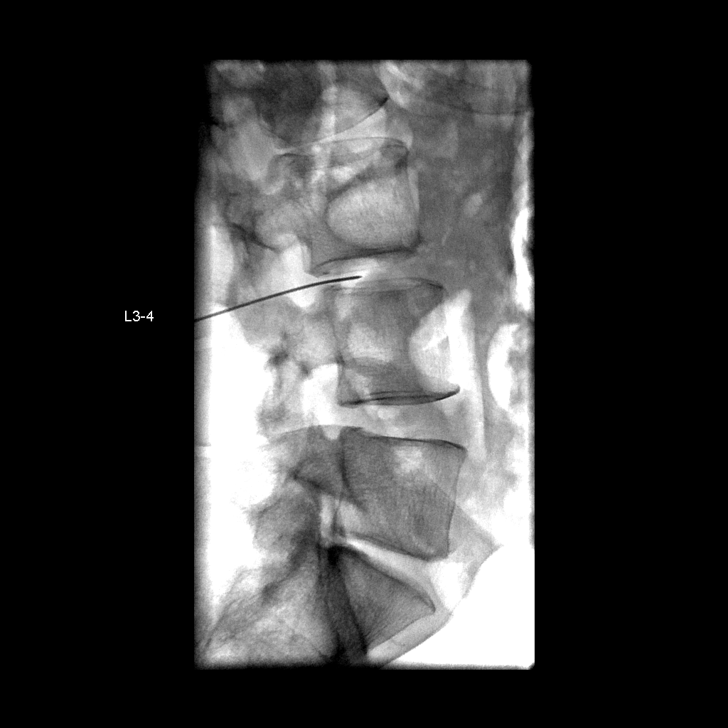
[im 9/10]
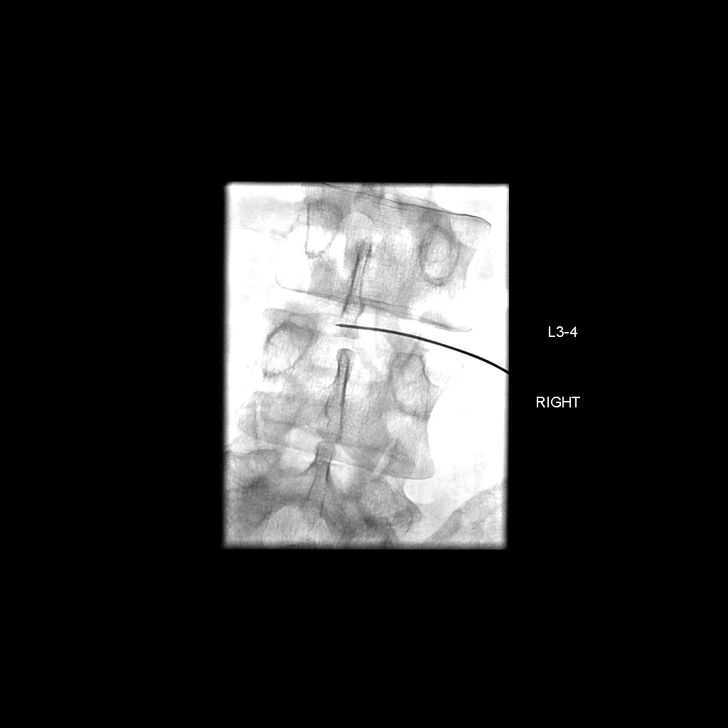
[im 10/10]
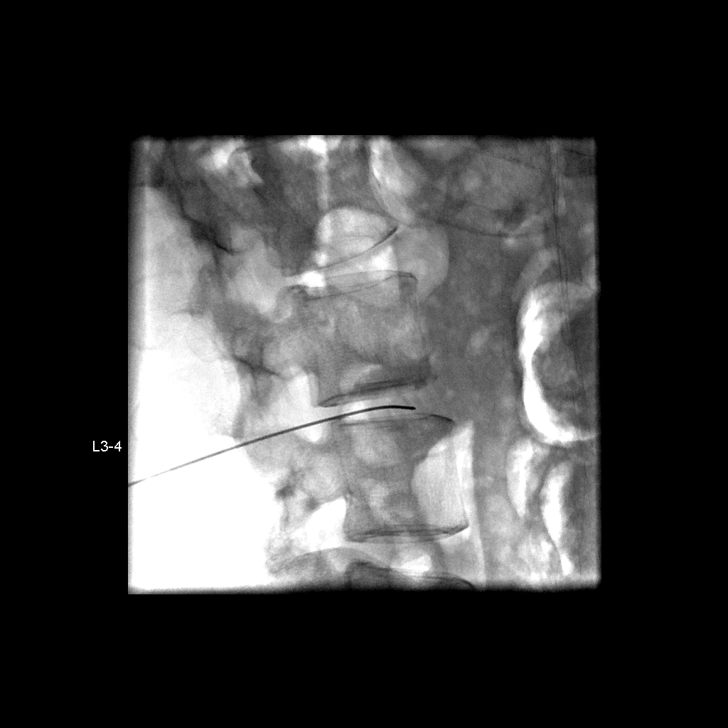

[10 of 10 positions shown; findings below may reference images not displayed]

EXAM:
IR FLUORO GUIDE NEEDLE PLACEMENT /BIOPSY

ANESTHESIA/SEDATION:
Conscious sedation.

MEDICATIONS:
Versed 1 mg IV.  Fentanyl 100 mcg IV.

CONTRAST:  None.

PROCEDURE:
Following a full explanation of the procedure along with the
potential associated complications, an informed witnessed consent
was obtained. The patient laid prone on the fluoroscopic table. The
skin overlying the lumbar region was then prepped and draped in the
usual sterile fashion. The skin entry site at L3-L4 in the right
parasagittal soft tissues was then infiltrated with 0.25%
bupivacaine, and extending into the paraspinous muscles.

Using biplane intermittent fluoroscopy, a 20 gauge Landeros biopsy
needle was then advanced into the L3-L4 disc space without
difficulty. Using a 20 mL syringe, bloody aspirate was obtained. A
second pass was then made with a different 20 gauge Landeros needle.
Again using a 20 mL syringe, bloody aspirate was obtained. The total
amount of aspirate was approximately 2 mL. The specimen was sent for
microbiological analysis. Hemostasis was achieved at the skin entry
site. The patient tolerated the procedure well.

COMPLICATIONS:
None immediate.
FINDINGS: As above.
IMPRESSION: Status post fluoroscopic guided disk aspiration at L3-L4 as
described above without event.

## 2014-09-21 IMAGING — CR DG FOOT COMPLETE 3+V*L*
3 series · 3 of 3 positions shown · non-contrast
Comparison: None.

CLINICAL DATA: Left foot pain.

EXAM:
LEFT FOOT - COMPLETE 3+ VIEW

[x foot ap left]
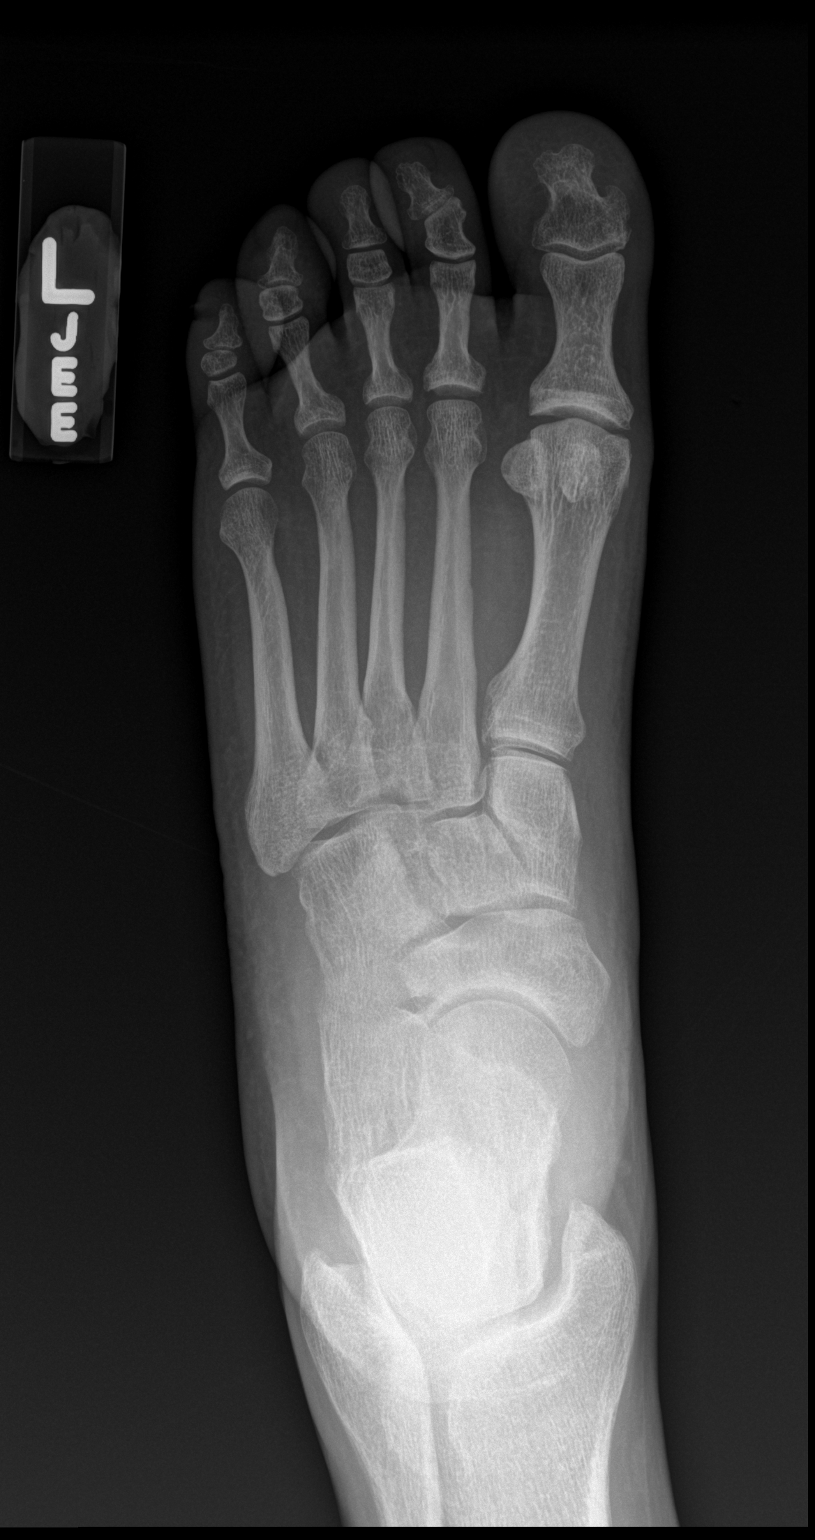

[x foot obl left]
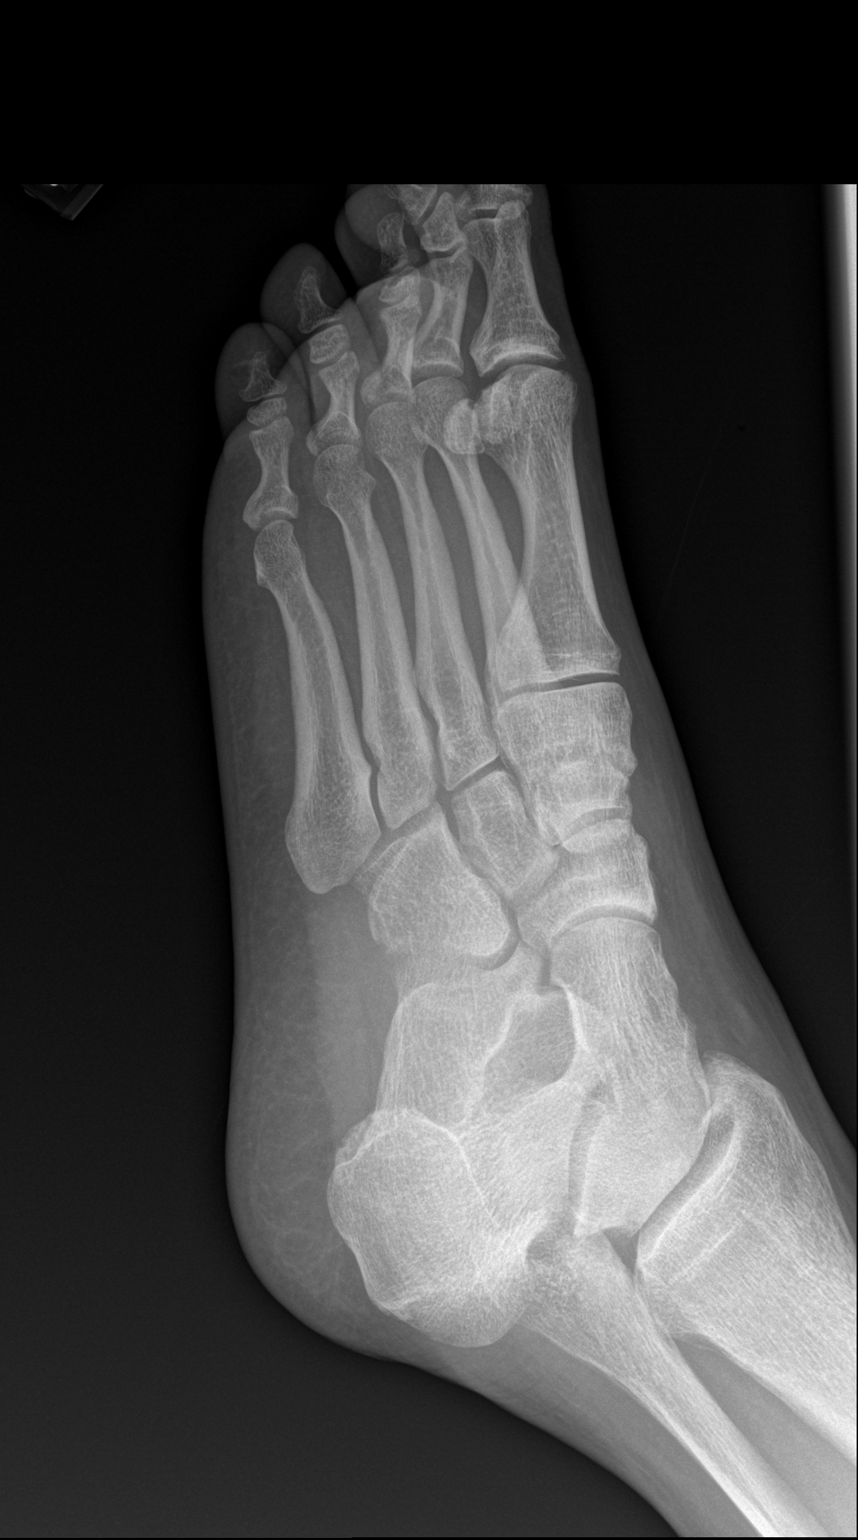

[x foot lat left]
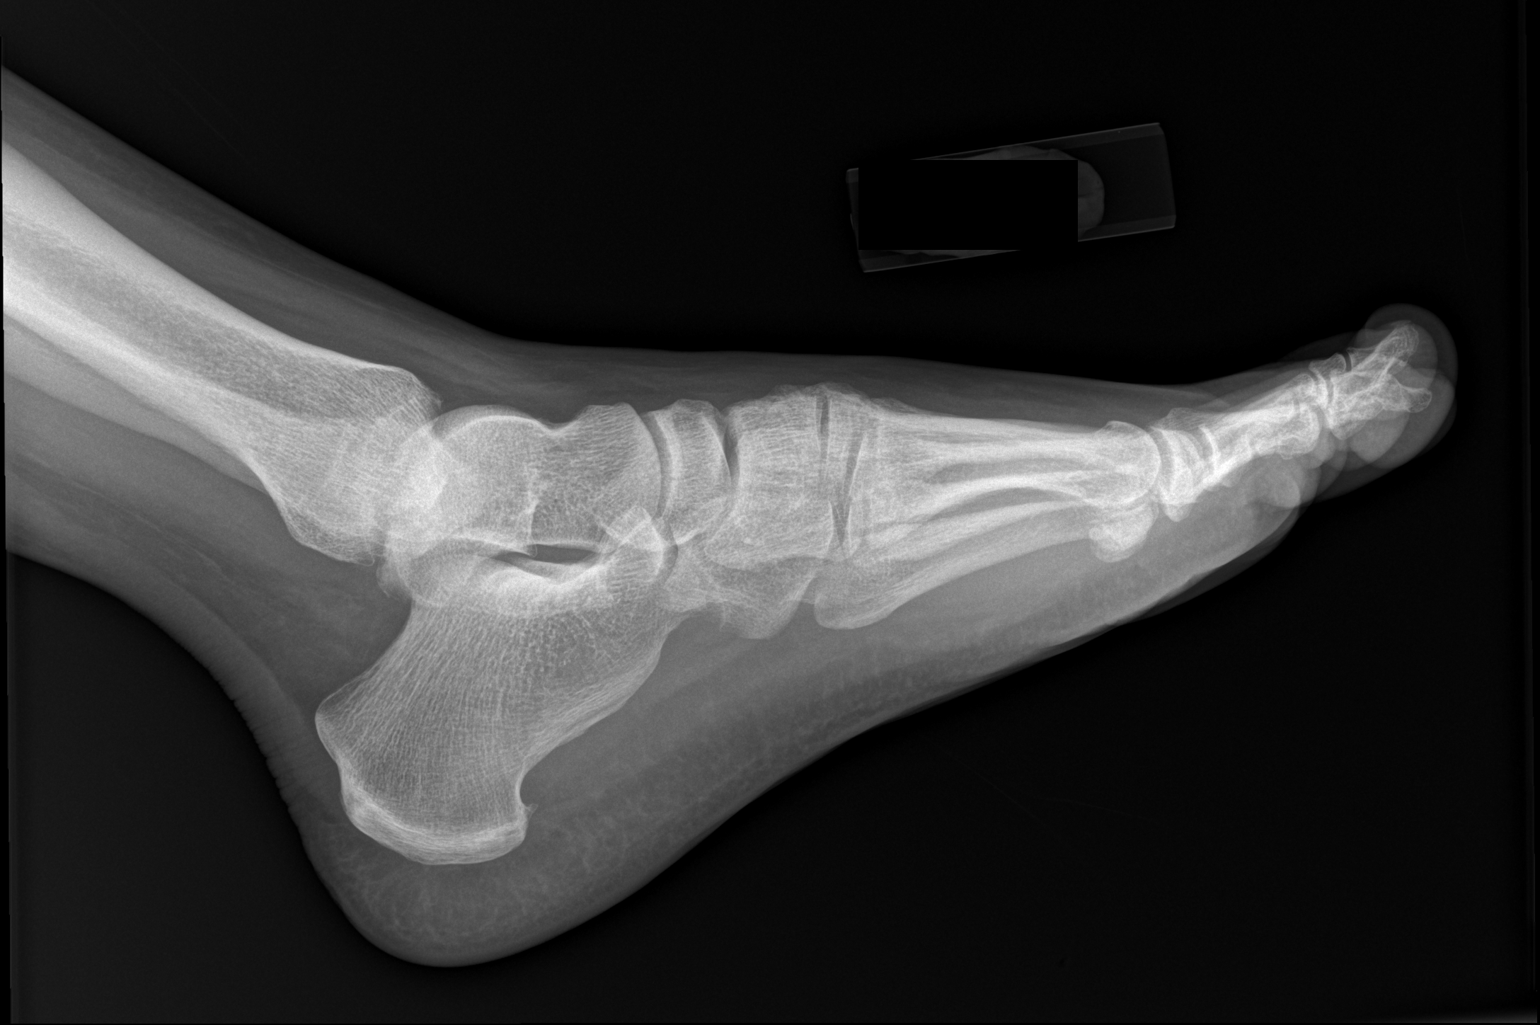

[3 of 3 positions shown; findings below may reference images not displayed]

FINDINGS: There are slight arthritic changes of the first metatarsal
phalangeal joint. The bones are otherwise normal other than a tiny
plantar calcaneal spur.
IMPRESSION: Minimal degenerative changes of the first metatarsal phalangeal
joint.

## 2014-09-24 IMAGING — CR DG CHEST 1V PORT
1 series · 1 of 1 positions shown · non-contrast
Comparison: Portable exam 5779 hr compared to 11/06/2013

CLINICAL DATA: Line placement

EXAM:
PORTABLE CHEST - 1 VIEW

[AP]
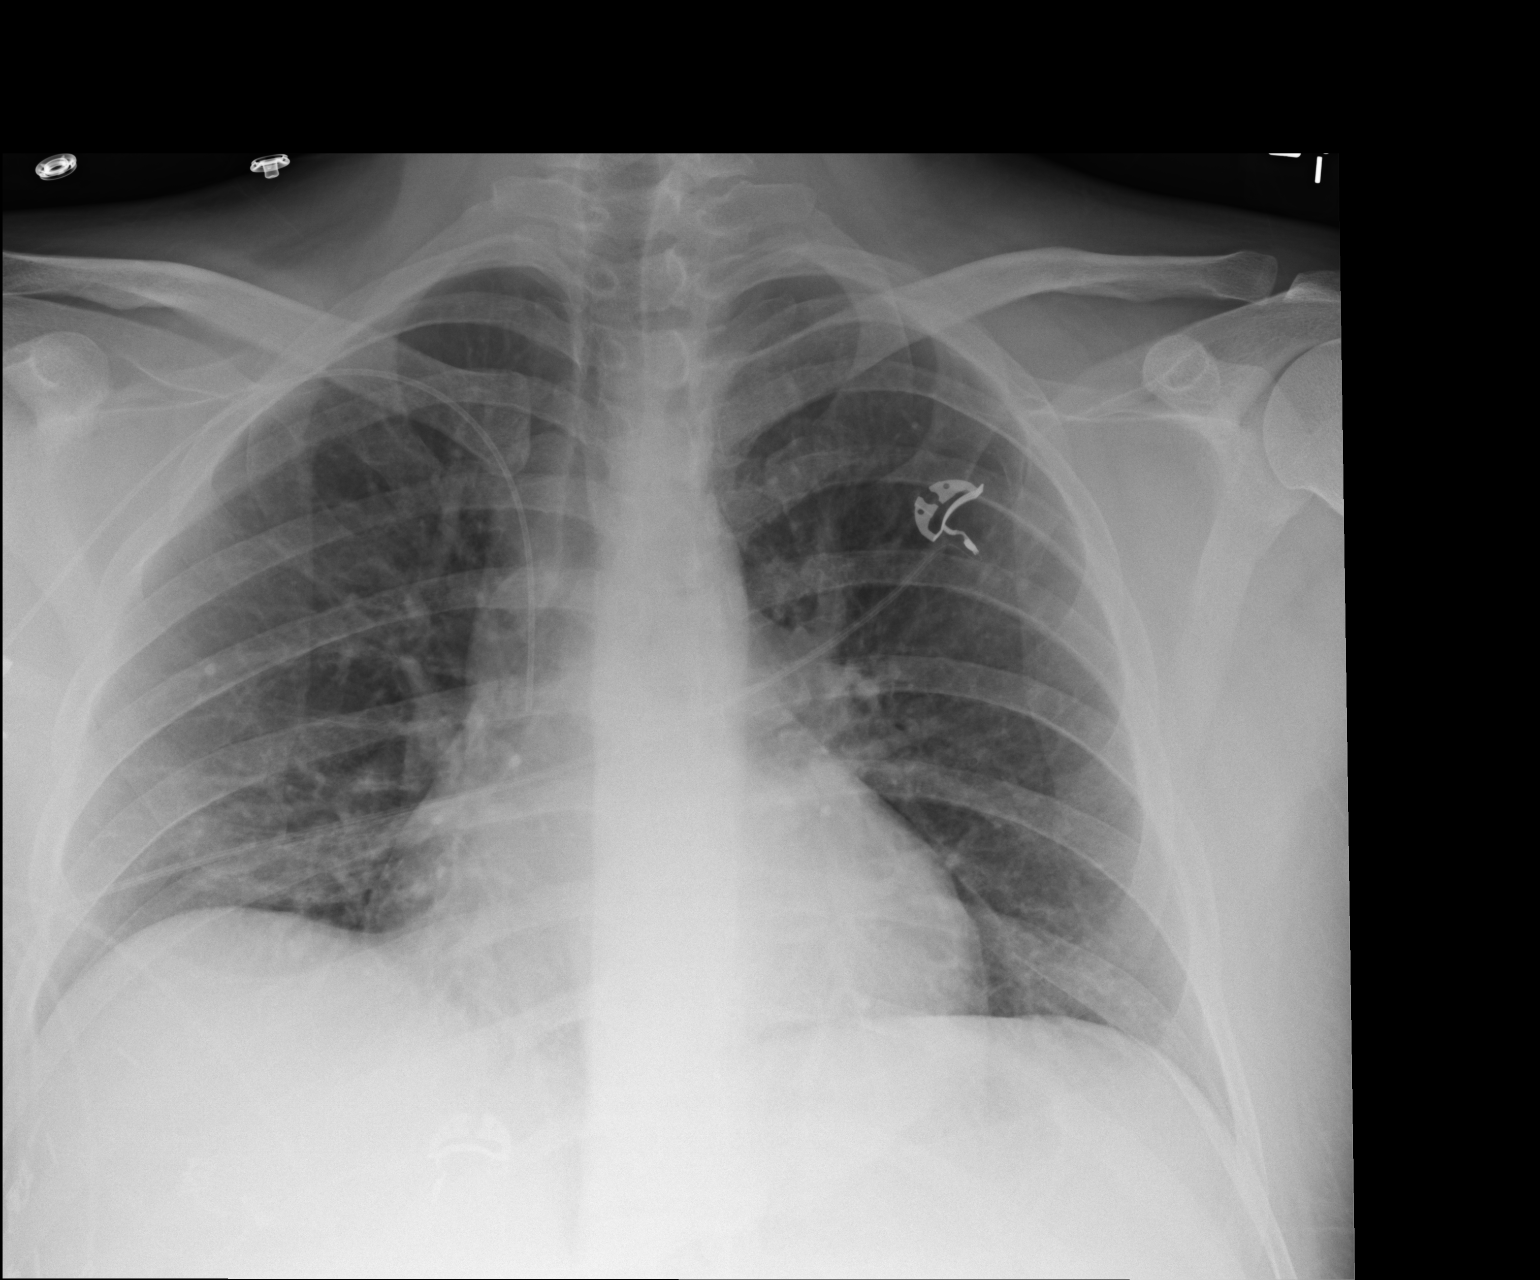

[1 of 1 positions shown; findings below may reference images not displayed]

FINDINGS: RIGHT arm PICC line tip projects over mid SVC.

Upper normal heart size.

Normal mediastinal contours and pulmonary vascularity.

Low lung volumes with RIGHT basilar atelectasis.

No infiltrate, pleural effusion or pneumothorax.
IMPRESSION: RIGHT basilar atelectasis.

Tip of RIGHT arm PICC line projects over mid SVC.

## 2018-10-07 DEATH — deceased
